# Patient Record
Sex: Female | Born: 1970 | Race: White | Hispanic: No | Smoking: Current every day smoker
Health system: Southern US, Community
[De-identification: ages and names within clinical notes are randomized; demographics above are authoritative.]

## PROBLEM LIST (undated history)

## (undated) DIAGNOSIS — F329 Major depressive disorder, single episode, unspecified: Secondary | ICD-10-CM

## (undated) DIAGNOSIS — I499 Cardiac arrhythmia, unspecified: Secondary | ICD-10-CM

## (undated) DIAGNOSIS — F419 Anxiety disorder, unspecified: Secondary | ICD-10-CM

## (undated) DIAGNOSIS — S93409A Sprain of unspecified ligament of unspecified ankle, initial encounter: Secondary | ICD-10-CM

## (undated) DIAGNOSIS — J449 Chronic obstructive pulmonary disease, unspecified: Secondary | ICD-10-CM

## (undated) DIAGNOSIS — F32A Depression, unspecified: Secondary | ICD-10-CM

## (undated) DIAGNOSIS — C3490 Malignant neoplasm of unspecified part of unspecified bronchus or lung: Secondary | ICD-10-CM

## (undated) DIAGNOSIS — Z923 Personal history of irradiation: Secondary | ICD-10-CM

## (undated) DIAGNOSIS — N289 Disorder of kidney and ureter, unspecified: Secondary | ICD-10-CM

## (undated) HISTORY — PX: APPENDECTOMY: SHX54

## (undated) HISTORY — PX: TUBAL LIGATION: SHX77

---

## 1996-11-25 HISTORY — PX: TUBAL LIGATION: SHX77

## 1998-08-20 ENCOUNTER — Emergency Department (HOSPITAL_COMMUNITY): Admission: EM | Admit: 1998-08-20 | Discharge: 1998-08-20 | Payer: Self-pay | Admitting: Emergency Medicine

## 1998-09-12 ENCOUNTER — Emergency Department (HOSPITAL_COMMUNITY): Admission: EM | Admit: 1998-09-12 | Discharge: 1998-09-12 | Payer: Self-pay | Admitting: Emergency Medicine

## 1998-12-15 ENCOUNTER — Emergency Department (HOSPITAL_COMMUNITY): Admission: EM | Admit: 1998-12-15 | Discharge: 1998-12-15 | Payer: Self-pay | Admitting: Emergency Medicine

## 1998-12-30 ENCOUNTER — Emergency Department (HOSPITAL_COMMUNITY): Admission: EM | Admit: 1998-12-30 | Discharge: 1998-12-30 | Payer: Self-pay | Admitting: Emergency Medicine

## 1999-01-29 ENCOUNTER — Emergency Department (HOSPITAL_COMMUNITY): Admission: EM | Admit: 1999-01-29 | Discharge: 1999-01-29 | Payer: Self-pay | Admitting: Emergency Medicine

## 1999-02-27 ENCOUNTER — Emergency Department (HOSPITAL_COMMUNITY): Admission: EM | Admit: 1999-02-27 | Discharge: 1999-02-27 | Payer: Self-pay | Admitting: Emergency Medicine

## 1999-03-22 ENCOUNTER — Emergency Department (HOSPITAL_COMMUNITY): Admission: EM | Admit: 1999-03-22 | Discharge: 1999-03-22 | Payer: Self-pay | Admitting: Emergency Medicine

## 1999-04-30 ENCOUNTER — Encounter: Payer: Self-pay | Admitting: Obstetrics

## 1999-04-30 ENCOUNTER — Encounter: Payer: Self-pay | Admitting: *Deleted

## 1999-04-30 ENCOUNTER — Inpatient Hospital Stay (HOSPITAL_COMMUNITY): Admission: AD | Admit: 1999-04-30 | Discharge: 1999-04-30 | Payer: Self-pay | Admitting: Obstetrics

## 1999-06-06 ENCOUNTER — Inpatient Hospital Stay (HOSPITAL_COMMUNITY): Admission: AD | Admit: 1999-06-06 | Discharge: 1999-06-07 | Payer: Self-pay | Admitting: *Deleted

## 1999-09-19 ENCOUNTER — Emergency Department (HOSPITAL_COMMUNITY): Admission: EM | Admit: 1999-09-19 | Discharge: 1999-09-19 | Payer: Self-pay | Admitting: Emergency Medicine

## 1999-09-21 ENCOUNTER — Inpatient Hospital Stay (HOSPITAL_COMMUNITY): Admission: AD | Admit: 1999-09-21 | Discharge: 1999-09-21 | Payer: Self-pay | Admitting: *Deleted

## 1999-11-01 ENCOUNTER — Emergency Department (HOSPITAL_COMMUNITY): Admission: EM | Admit: 1999-11-01 | Discharge: 1999-11-01 | Payer: Self-pay | Admitting: *Deleted

## 2000-02-14 ENCOUNTER — Emergency Department (HOSPITAL_COMMUNITY): Admission: EM | Admit: 2000-02-14 | Discharge: 2000-02-14 | Payer: Self-pay | Admitting: Emergency Medicine

## 2000-04-21 ENCOUNTER — Emergency Department (HOSPITAL_COMMUNITY): Admission: EM | Admit: 2000-04-21 | Discharge: 2000-04-21 | Payer: Self-pay | Admitting: *Deleted

## 2000-04-22 ENCOUNTER — Emergency Department (HOSPITAL_COMMUNITY): Admission: EM | Admit: 2000-04-22 | Discharge: 2000-04-22 | Payer: Self-pay | Admitting: Internal Medicine

## 2000-05-04 ENCOUNTER — Emergency Department (HOSPITAL_COMMUNITY): Admission: EM | Admit: 2000-05-04 | Discharge: 2000-05-04 | Payer: Self-pay | Admitting: *Deleted

## 2001-01-19 ENCOUNTER — Emergency Department (HOSPITAL_COMMUNITY): Admission: EM | Admit: 2001-01-19 | Discharge: 2001-01-19 | Payer: Self-pay | Admitting: Emergency Medicine

## 2001-02-09 ENCOUNTER — Inpatient Hospital Stay (HOSPITAL_COMMUNITY): Admission: RE | Admit: 2001-02-09 | Discharge: 2001-02-12 | Payer: Self-pay | Admitting: *Deleted

## 2001-04-12 ENCOUNTER — Inpatient Hospital Stay (HOSPITAL_COMMUNITY): Admission: AD | Admit: 2001-04-12 | Discharge: 2001-04-12 | Payer: Self-pay | Admitting: *Deleted

## 2001-04-14 ENCOUNTER — Encounter: Payer: Self-pay | Admitting: *Deleted

## 2001-04-14 ENCOUNTER — Ambulatory Visit (HOSPITAL_COMMUNITY): Admission: RE | Admit: 2001-04-14 | Discharge: 2001-04-14 | Payer: Self-pay | Admitting: *Deleted

## 2001-04-17 ENCOUNTER — Encounter (HOSPITAL_COMMUNITY): Admission: AD | Admit: 2001-04-17 | Discharge: 2001-05-04 | Payer: Self-pay | Admitting: *Deleted

## 2001-04-27 ENCOUNTER — Inpatient Hospital Stay (HOSPITAL_COMMUNITY): Admission: AD | Admit: 2001-04-27 | Discharge: 2001-04-27 | Payer: Self-pay | Admitting: Obstetrics & Gynecology

## 2001-05-01 ENCOUNTER — Inpatient Hospital Stay (HOSPITAL_COMMUNITY): Admission: AD | Admit: 2001-05-01 | Discharge: 2001-05-03 | Payer: Self-pay | Admitting: *Deleted

## 2001-05-01 ENCOUNTER — Encounter (INDEPENDENT_AMBULATORY_CARE_PROVIDER_SITE_OTHER): Payer: Self-pay | Admitting: Specialist

## 2001-06-09 ENCOUNTER — Encounter: Admission: RE | Admit: 2001-06-09 | Discharge: 2001-06-09 | Payer: Self-pay | Admitting: Obstetrics & Gynecology

## 2001-06-09 ENCOUNTER — Other Ambulatory Visit: Admission: RE | Admit: 2001-06-09 | Discharge: 2001-06-09 | Payer: Self-pay | Admitting: Obstetrics & Gynecology

## 2001-06-17 ENCOUNTER — Ambulatory Visit (HOSPITAL_COMMUNITY): Admission: RE | Admit: 2001-06-17 | Discharge: 2001-06-17 | Payer: Self-pay

## 2001-06-18 ENCOUNTER — Encounter: Payer: Self-pay | Admitting: Emergency Medicine

## 2001-06-18 ENCOUNTER — Emergency Department (HOSPITAL_COMMUNITY): Admission: EM | Admit: 2001-06-18 | Discharge: 2001-06-18 | Payer: Self-pay | Admitting: Emergency Medicine

## 2001-07-06 ENCOUNTER — Ambulatory Visit (HOSPITAL_COMMUNITY): Admission: RE | Admit: 2001-07-06 | Discharge: 2001-07-06 | Payer: Self-pay | Admitting: Obstetrics & Gynecology

## 2001-07-22 ENCOUNTER — Emergency Department (HOSPITAL_COMMUNITY): Admission: EM | Admit: 2001-07-22 | Discharge: 2001-07-22 | Payer: Self-pay | Admitting: Emergency Medicine

## 2001-07-22 ENCOUNTER — Encounter: Payer: Self-pay | Admitting: Emergency Medicine

## 2001-09-04 ENCOUNTER — Encounter: Admission: RE | Admit: 2001-09-04 | Discharge: 2001-09-04 | Payer: Self-pay | Admitting: Obstetrics & Gynecology

## 2002-05-16 ENCOUNTER — Emergency Department (HOSPITAL_COMMUNITY): Admission: EM | Admit: 2002-05-16 | Discharge: 2002-05-16 | Payer: Self-pay | Admitting: Emergency Medicine

## 2002-06-10 ENCOUNTER — Emergency Department (HOSPITAL_COMMUNITY): Admission: EM | Admit: 2002-06-10 | Discharge: 2002-06-10 | Payer: Self-pay | Admitting: Emergency Medicine

## 2002-06-10 ENCOUNTER — Encounter: Payer: Self-pay | Admitting: Emergency Medicine

## 2003-04-05 ENCOUNTER — Emergency Department (HOSPITAL_COMMUNITY): Admission: EM | Admit: 2003-04-05 | Discharge: 2003-04-05 | Payer: Self-pay | Admitting: Emergency Medicine

## 2003-04-28 ENCOUNTER — Emergency Department (HOSPITAL_COMMUNITY): Admission: EM | Admit: 2003-04-28 | Discharge: 2003-04-28 | Payer: Self-pay | Admitting: *Deleted

## 2003-04-28 ENCOUNTER — Encounter: Payer: Self-pay | Admitting: *Deleted

## 2003-06-17 ENCOUNTER — Emergency Department (HOSPITAL_COMMUNITY): Admission: EM | Admit: 2003-06-17 | Discharge: 2003-06-17 | Payer: Self-pay | Admitting: Emergency Medicine

## 2003-08-26 ENCOUNTER — Encounter: Payer: Self-pay | Admitting: Emergency Medicine

## 2003-08-26 ENCOUNTER — Emergency Department (HOSPITAL_COMMUNITY): Admission: EM | Admit: 2003-08-26 | Discharge: 2003-08-26 | Payer: Self-pay | Admitting: Emergency Medicine

## 2003-11-30 ENCOUNTER — Emergency Department (HOSPITAL_COMMUNITY): Admission: EM | Admit: 2003-11-30 | Discharge: 2003-11-30 | Payer: Self-pay | Admitting: Emergency Medicine

## 2004-01-30 ENCOUNTER — Emergency Department (HOSPITAL_COMMUNITY): Admission: EM | Admit: 2004-01-30 | Discharge: 2004-01-31 | Payer: Self-pay | Admitting: Emergency Medicine

## 2004-06-11 ENCOUNTER — Emergency Department (HOSPITAL_COMMUNITY): Admission: EM | Admit: 2004-06-11 | Discharge: 2004-06-11 | Payer: Self-pay | Admitting: Emergency Medicine

## 2005-05-05 ENCOUNTER — Emergency Department (HOSPITAL_COMMUNITY): Admission: EM | Admit: 2005-05-05 | Discharge: 2005-05-05 | Payer: Self-pay | Admitting: *Deleted

## 2006-09-29 ENCOUNTER — Emergency Department (HOSPITAL_COMMUNITY): Admission: EM | Admit: 2006-09-29 | Discharge: 2006-09-29 | Payer: Self-pay | Admitting: Emergency Medicine

## 2006-10-08 ENCOUNTER — Emergency Department (HOSPITAL_COMMUNITY): Admission: EM | Admit: 2006-10-08 | Discharge: 2006-10-08 | Payer: Self-pay | Admitting: Emergency Medicine

## 2007-05-29 ENCOUNTER — Emergency Department (HOSPITAL_COMMUNITY): Admission: EM | Admit: 2007-05-29 | Discharge: 2007-05-29 | Payer: Self-pay | Admitting: *Deleted

## 2007-06-06 ENCOUNTER — Emergency Department (HOSPITAL_COMMUNITY): Admission: EM | Admit: 2007-06-06 | Discharge: 2007-06-06 | Payer: Self-pay | Admitting: *Deleted

## 2007-09-02 ENCOUNTER — Emergency Department (HOSPITAL_COMMUNITY): Admission: EM | Admit: 2007-09-02 | Discharge: 2007-09-02 | Payer: Self-pay | Admitting: Emergency Medicine

## 2008-01-14 ENCOUNTER — Emergency Department (HOSPITAL_COMMUNITY): Admission: EM | Admit: 2008-01-14 | Discharge: 2008-01-14 | Payer: Self-pay | Admitting: Emergency Medicine

## 2008-02-12 ENCOUNTER — Emergency Department (HOSPITAL_COMMUNITY): Admission: EM | Admit: 2008-02-12 | Discharge: 2008-02-12 | Payer: Self-pay | Admitting: Emergency Medicine

## 2008-03-17 ENCOUNTER — Emergency Department (HOSPITAL_COMMUNITY): Admission: EM | Admit: 2008-03-17 | Discharge: 2008-03-17 | Payer: Self-pay | Admitting: Emergency Medicine

## 2008-12-16 ENCOUNTER — Emergency Department (HOSPITAL_COMMUNITY): Admission: EM | Admit: 2008-12-16 | Discharge: 2008-12-16 | Payer: Self-pay | Admitting: Emergency Medicine

## 2009-12-31 ENCOUNTER — Emergency Department (HOSPITAL_COMMUNITY): Admission: EM | Admit: 2009-12-31 | Discharge: 2009-12-31 | Payer: Self-pay | Admitting: Emergency Medicine

## 2011-04-12 NOTE — H&P (Signed)
[facility]  Patient:    [name], [name]                       MRN: [id number] Adm. Date:  [id number] Disc. Date: [id number] Attending:  Michaelle Copas CC:         GYN Outpatient Clinic  OB-GYN Teaching Service   History and Physical  CHIEF COMPLAINT:              The patient is a 40 year old, para 4, who desires a sterilization procedure.  HISTORY OF PRESENT ILLNESS:   At present, the patient is on Depo-Provera.  The risks, benefits, and alternative forms of management were reviewed with the patient, including, but not limited a risk of failure of 4-06/999 cases with a subsequent increased risk of an ectopic gestation.  ALLERGIES:                    No known drug allergies.  MEDICATIONS:                  Tylenol.  PAST OBSTETRICAL HISTORY:     She has a history of gonorrhea.  No hospitalizations for PID.  She is status post four spontaneous vaginal deliveries.  She is HIV nonreactive by report.  She has a history of ovarian cyst.  PAST SURGICAL HISTORY:        She is status post an appendectomy.  SOCIAL HISTORY:               Remarkable for tobacco use.  PHYSICAL EXAMINATION:         The pulse is 77 and blood pressure 113/77.  WEIGHT:                       148 pounds.  HEIGHT:                       5 feet 8 inches.  GENERAL APPEARANCE:           Well developed, well nourished, and no apparent distress.  HEENT:                        Normocephalic and atraumatic.  NECK:                         Supple.  LUNGS:                        Clear to auscultation bilaterally.  HEART:                        Regular rate and rhythm.  ABDOMEN:                      Soft.  There is minimal right lower quadrant tenderness to deep palpation.  PELVIC:                       The external female genitalia are normal appearing.  On speculum exam, there is scant white discharge.  Pap smear, GC and chlamydia probes, and wet prep were sent.  By  bimanual exam, the uterus is small, anteverted, and nontender.  There is minimal right adnexal tenderness. No masses palpated.  The left adnexa is nonpalpable and nontender.  EXTREMITIES:  No clubbing, cyanosis, or edema.  SKIN:                         Without rash.  ASSESSMENT:                   Multiparity, desires sterilization procedure.  PLAN:                         Laparoscopic bilateral tubal ligation.  Will check the Pap smear, GC and chlamydia probes, and wet prep performed today. DD:  06/09/01 TD:  06/09/01 Job: [id number]

## 2011-04-12 NOTE — Op Note (Signed)
[facility]  Patient:    [name], [name]                       MRN: [id number] Proc. Date: 07/06/01 Adm. Date:  [id number] Attending:  Antionette Char CC:         GYN OUtpatient Clinic   Operative Report  PREOPERATIVE DIAGNOSIS:       Multiparity, desires permanent sterilization.  POSTOPERATIVE DIAGNOSIS:      Multiparity, desires permanent sterilization.  OPERATION:                    Laparoscopic tubal ligation with bipolar cautery.  SURGEON:                      Roseanna Rainbow, M.D.  ASSISTANT:  ANESTHESIA:                   General endotracheal anesthesia.  COMPLICATIONS:                None.  ESTIMATED BLOOD LOSS:         75 cc.  FLUIDS:                       As per anesthesiology.  URINE OUTPUT:                 200 cc clear urine at the beginning of the procedure.  FINDINGS:                     Normal uterus and ovaries.  There were filmy peritubal adhesions involving the ampullary portion of the tube on the patients right.  There were also filmy adhesions involving the cecum and there were adhesions involving the greater omentum to the anterior abdominal wall in right lower abdomen.  DESCRIPTION OF PROCEDURE:     The patient was taken to the operating room where general anesthesia was obtained without difficulty.  The patient was examined under anesthesia and found to have a small anteverted uterus with normal adnexa.  She was then placed in the dorsal lithotomy position and prepped and draped in the usual sterile fashion.  A bivalve speculum was then placed into the patients vagina and the Hulka probe and tenaculum were then advanced into the uterus and secured to the anterior lip of the cervix as a means to manipulate the uterus.  The speculum was removed from the vagina. Attention was then turned to the patients abdomen where a 10 mm skin incision was made in the umbilical fold.  The Veress needle was carefully  introduced into the peritoneal cavity at a 45 degree angle while tenting the abdominal wall.  Intraperitoneal placement was confirmed by water-filled syringe and drop in the intra-abdominal pressure with insufflation of CO2 gas.  A pneumoperitoneum was obtained with 4 liters of CO2 gas.  The trocar and sleeve were then advanced without difficulty into the abdomen where intra-abdominal placement was confirmed by the laparoscope.  Survey of the patients pelvis and abdomen revealed the above findings.  The bipolar cautery apparatus was then advanced through the operative port of the laparoscope and the patients left fallopian tube was identified and followed out to the fimbriated end.  A 2 to 3 cm segment of tube approximately 4 cm from the uterine cornu was then cauterized contiguously.  With each application, the ohmeter was noted to go to 0.  The  right fallopian tube was manipulated in a similar fashion.  The instruments were removed from the patients abdomen and the incision repaired with 3-0 Vicryl.  The Hulka tenaculum and probe were then removed from the vagina with minimal bleeding noted from the cervix.  The patient tolerated the procedure well.  Sponge, needle, and instrument counts were correct x 2. The patient was taken to the PACU in stable condition. DD:  07/06/01 TD:  07/06/01 Job: [id number]
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
## 2011-04-12 NOTE — H&P (Signed)
[facility]  Patient:    [name], [name]                       MRN: [id number] Adm. Date:  [id number] Attending:  Denny Peon Dictator:   Young Berry Lorin Picket, RNNP                         History and Physical  ADDENDUM:  REVIEW OF SYSTEMS:  CONSTITUTIONAL:  The patient reports she had an appendectomy aspirin  child. She currently has three children, all by normal vaginal deliveries. She reports problems with iron deficiency anemia with each pregnancy along with some back pain and lower abdominal pain after walking after each pregnancy. The patient also reports problems with frequent and persistent headache as previously noted in the history, although this morning she notes that her headache has resolved and she is without it and she slept very well last night. CARDIAC:  The patient reports no history of syncope, swelling, heart murmur, peripheral edema, no problems with palpitations or angina. PULMONARY:  The patient reports a history of asthma but has not taken medication for this in more than a year nor has she had an episode of shortness of breath in more than a year.  NEUROLOGICAL:  The patient reports no history of seizures, brain tumor or stroke. She does complain of headache as previously noted. HEMATOLOGY:  The patient reports problems with "low iron" with each pregnancy. She has been on prenatal vitamins but has declined to take this more days than not due to the indigestion that it has caused her and she has been taking this in the morning.  ENDOCRINE:  The patient reports no history of diabetes, denies any problems with thyroid, no problems with intolerance to heat or cold.  GI:  The patient reports indigestion periodically. It seems to be getting worse as her pregnancy progresses, this most often after meals, especially after eating spicy foods. She denies any problems with constipation. GENITOURINARY:  The patient reports that she  had urinary tract infection in the past, last one over a year ago. MUSCULOSKELETAL:  The patient denies any problems. SKIN: The patient reports that she has one tattoo over her right breast, a small one. NUTRITION:  The patient reports a weight loss of approximately 10 pounds in the past month she believes, due to her depression.  PHYSICAL EXAMINATION:  CONSTITUTIONAL:  This is a well-developed, well-nourished white female who is in in no acute distress. She is relaxed and cooperative and is generally healthy in appearance.  VITAL SIGNS:  At 8:00 a.m. temperature 96.9, pulse 97, respirations 20 and blood pressure 130/71 sitting and 133/70 standing. Weight today 159 pounds and she reports her height at 5 feet 8 inches.  The patient appears to be her stated age of 40 years old.  SKIN:  Medium in tone and is smooth. The patient has some striae noted on her lower abdomen on the right and has an old appendectomy scar noted. Also a small tattoo is noted above the right breast. Skin is otherwise unremarkable.  NAILS:  In satisfactory condition.  HAIR:  The patient has thick, lustrous hair, long and in good condition and this is of a light brown. Hair distribution is normal for age and sex.  HEAD:  Normocephalic without lesions. No tenderness noted over frontal or maxillary sinuses. Head is held midline.  ENT:  Pupils equal, round and reactive to  light and accommodation. Extraocular movements intact. No vessel changes seen. Disks are not visualized bilaterally. Ears:  External ear canals are patent. Tympanic membranes normal light reflexes with landmarks noted bilaterally. Nasal mucosa is pink and moist with a small amount of clear rhinorrhea. Septum is intact. Oral mucosa is pink and moist. Teeth are in satisfactory condition. No remarkable breath odors. Tongue is midline without fasciculation, uvula is midline.  NECK:  Supple without thyromegaly.  CARDIOVASCULAR:  S1 and S2 heard,  regular rate and rhythm without murmurs, clicks or gallops. Carotids are 2+ without bruit, no abdominal bruit is heard, no JVD.  PERIPHERAL PULSES:  Two plus.  EXTREMITIES:  Pink and warm without edema. No clubbing noted.  CHEST:  Symmetrical in shape.  BREASTS:  Exam deferred.  LUNGS:  Clear to auscultation, no wheezes noted. There is no CVA tenderness noted.  ABDOMEN:  Rounded. Did not attempt to gauge fundal height. Fetal movements are palpable.  Did not attempt to auscultate fetal heart tones. Bowel sounds are present. No tenderness to palpation, no evidence of hepatomegaly. No suprapubic tenderness noted.  MUSCULOSKELETAL:  Patient is of upright posture with normal gait. Spine is straight. No redness or swelling of any joints. No restrictions in range of motion evident.  NEUROLOGICAL:  Extraocular movements intact without nystagmus. Cranial nerves II-XII intact. Motor movements are smooth, no tremor. Sensory is intact. Cerebellar function is intact for rapid alternating movements and heel-to-shin movements. Reflexes are 3+/5 and brisk. Romberg is without finding. Babinskis are upward. DD:  02/11/01 TD:  02/11/01 Job: [id number]

## 2011-07-17 ENCOUNTER — Emergency Department (HOSPITAL_COMMUNITY): Payer: Self-pay

## 2011-07-17 ENCOUNTER — Emergency Department (HOSPITAL_COMMUNITY)
Admission: EM | Admit: 2011-07-17 | Discharge: 2011-07-17 | Disposition: A | Discharge status: Home / Self Care | Payer: Self-pay | Attending: Emergency Medicine | Admitting: Emergency Medicine

## 2011-07-17 DIAGNOSIS — R209 Unspecified disturbances of skin sensation: Secondary | ICD-10-CM | POA: Insufficient documentation

## 2011-07-17 DIAGNOSIS — F17200 Nicotine dependence, unspecified, uncomplicated: Secondary | ICD-10-CM | POA: Insufficient documentation

## 2011-07-17 DIAGNOSIS — R059 Cough, unspecified: Secondary | ICD-10-CM | POA: Insufficient documentation

## 2011-07-17 DIAGNOSIS — R0602 Shortness of breath: Secondary | ICD-10-CM | POA: Insufficient documentation

## 2011-07-17 DIAGNOSIS — J45909 Unspecified asthma, uncomplicated: Secondary | ICD-10-CM | POA: Insufficient documentation

## 2011-07-17 DIAGNOSIS — J40 Bronchitis, not specified as acute or chronic: Secondary | ICD-10-CM | POA: Insufficient documentation

## 2011-07-17 DIAGNOSIS — R05 Cough: Secondary | ICD-10-CM | POA: Insufficient documentation

## 2011-08-19 LAB — CBC
HCT: 44
Hemoglobin: 15.3 — ABNORMAL HIGH
RDW: 12.5
WBC: 6.7

## 2011-08-19 LAB — I-STAT 8, (EC8 V) (CONVERTED LAB)
BUN: 9
Glucose, Bld: 99
Hemoglobin: 16.7 — ABNORMAL HIGH
Potassium: 3.6
Sodium: 136

## 2011-08-19 LAB — URINALYSIS, ROUTINE W REFLEX MICROSCOPIC
Glucose, UA: NEGATIVE
Ketones, ur: NEGATIVE
Leukocytes, UA: NEGATIVE
pH: 6

## 2011-08-19 LAB — DIFFERENTIAL
Eosinophils Relative: 2
Lymphocytes Relative: 36
Lymphs Abs: 2.4
Monocytes Absolute: 0.4

## 2011-08-19 LAB — URINE CULTURE

## 2011-08-19 LAB — URINE MICROSCOPIC-ADD ON

## 2012-01-24 ENCOUNTER — Emergency Department (HOSPITAL_COMMUNITY)
Admission: EM | Admit: 2012-01-24 | Discharge: 2012-01-24 | Disposition: A | Discharge status: Home / Self Care | Payer: Medicaid Other | Attending: Emergency Medicine | Admitting: Emergency Medicine

## 2012-01-24 ENCOUNTER — Emergency Department (HOSPITAL_COMMUNITY): Payer: Medicaid Other

## 2012-01-24 ENCOUNTER — Encounter (HOSPITAL_COMMUNITY): Payer: Self-pay | Admitting: Emergency Medicine

## 2012-01-24 DIAGNOSIS — R05 Cough: Secondary | ICD-10-CM | POA: Insufficient documentation

## 2012-01-24 DIAGNOSIS — R059 Cough, unspecified: Secondary | ICD-10-CM | POA: Insufficient documentation

## 2012-01-24 DIAGNOSIS — R1110 Vomiting, unspecified: Secondary | ICD-10-CM | POA: Insufficient documentation

## 2012-01-24 DIAGNOSIS — J069 Acute upper respiratory infection, unspecified: Secondary | ICD-10-CM

## 2012-01-24 DIAGNOSIS — M549 Dorsalgia, unspecified: Secondary | ICD-10-CM | POA: Insufficient documentation

## 2012-01-24 DIAGNOSIS — J45909 Unspecified asthma, uncomplicated: Secondary | ICD-10-CM | POA: Insufficient documentation

## 2012-01-24 HISTORY — DX: Disorder of kidney and ureter, unspecified: N28.9

## 2012-01-24 MED ORDER — TRAMADOL-ACETAMINOPHEN 37.5-325 MG PO TABS
1.0000 | ORAL_TABLET | Freq: Once | ORAL | Status: AC
  Administered 2012-01-24: 1 via ORAL
  Filled 2012-01-24: qty 1

## 2012-01-24 MED ORDER — BENZONATATE 100 MG PO CAPS: 100.0000 mg | ORAL_CAPSULE | Freq: Three times a day (TID) | ORAL | Status: AC

## 2012-01-24 MED ORDER — IPRATROPIUM BROMIDE 0.02 % IN SOLN
0.5000 mg | Freq: Once | RESPIRATORY_TRACT | Status: AC
  Administered 2012-01-24: 0.5 mg via RESPIRATORY_TRACT
  Filled 2012-01-24 (×2): qty 2.5

## 2012-01-24 MED ORDER — ALBUTEROL SULFATE (5 MG/ML) 0.5% IN NEBU
5.0000 mg | INHALATION_SOLUTION | Freq: Once | RESPIRATORY_TRACT | Status: AC
  Administered 2012-01-24: 5 mg via RESPIRATORY_TRACT
  Filled 2012-01-24: qty 0.5

## 2012-01-24 MED ORDER — ALBUTEROL SULFATE HFA 108 (90 BASE) MCG/ACT IN AERS
2.0000 | INHALATION_SPRAY | RESPIRATORY_TRACT | Status: DC | PRN
  Administered 2012-01-24: 2 via RESPIRATORY_TRACT
  Filled 2012-01-24: qty 6.7

## 2012-01-24 MED ORDER — ALBUTEROL SULFATE HFA 108 (90 BASE) MCG/ACT IN AERS: 2.0000 | INHALATION_SPRAY | RESPIRATORY_TRACT | Status: DC | PRN

## 2012-01-24 NOTE — ED Notes (Signed)
Pt reports headache above eyes, cough causing vomiting , upper back pain x 2 days

## 2012-01-24 NOTE — Progress Notes (Signed)
Rt gave pt albuterl 5.0mg /atrovent 0.5mg  neb and albuterol inhaler with spacer. Pt does know how to use.

## 2012-01-24 NOTE — Discharge Instructions (Signed)
Cool Mist Vaporizers Vaporizers may help relieve the symptoms of a cough and cold. By adding water to the air, mucus may become thinner and less sticky. This makes it easier to breathe and cough up secretions. Vaporizers have not been proven to show they help with colds. You should not use a vaporizer if you are allergic to mold. Cool mist vaporizers do not cause serious burns like hot mist vaporizers ("steamers"). HOME CARE INSTRUCTIONS  Follow the package instructions for your vaporizer.   Use a vaporizer that holds a large volume of water (1 to 2 gallons [5.7 to 7.5 liters]).   Do not use anything other than distilled water in the vaporizer.   Do not run the vaporizer all of the time. This can cause mold or bacteria to grow in the vaporizer.   Clean the vaporizer after each time you use it.   Clean and dry the vaporizer well before you store it.   Stop using a vaporizer if you develop worsening respiratory symptoms.  Document Released: 08/08/2004 Document Revised: 07/24/2011 Document Reviewed: 07/06/2009 [facility] Patient Information 2012 [place], [place].Upper Respiratory Infection, Adult An upper respiratory infection (URI) is also sometimes known as the common cold. The upper respiratory tract includes the nose, sinuses, throat, trachea, and bronchi. Bronchi are the airways leading to the lungs. Most people improve within 1 week, but symptoms can last up to 2 weeks. A residual cough may last even longer.  CAUSES Many different viruses can infect the tissues lining the upper respiratory tract. The tissues become irritated and inflamed and often become very moist. Mucus production is also common. A cold is contagious. You can easily spread the virus to others by oral contact. This includes kissing, sharing a glass, coughing, or sneezing. Touching your mouth or nose and then touching a surface, which is then touched by another person, can also spread the virus. SYMPTOMS  Symptoms typically  develop 1 to 3 days after you come in contact with a cold virus. Symptoms vary from person to person. They may include:  Runny nose.   Sneezing.   Nasal congestion.   Sinus irritation.   Sore throat.   Loss of voice (laryngitis).   Cough.   Fatigue.   Muscle aches.   Loss of appetite.   Headache.   Low-grade fever.  DIAGNOSIS  You might diagnose your own cold based on familiar symptoms, since most people get a cold 2 to 3 times a year. Your caregiver can confirm this based on your exam. Most importantly, your caregiver can check that your symptoms are not due to another disease such as strep throat, sinusitis, pneumonia, asthma, or epiglottitis. Blood tests, throat tests, and X-rays are not necessary to diagnose a common cold, but they may sometimes be helpful in excluding other more serious diseases. Your caregiver will decide if any further tests are required. RISKS AND COMPLICATIONS  You may be at risk for a more severe case of the common cold if you smoke cigarettes, have chronic heart disease (such as heart failure) or lung disease (such as asthma), or if you have a weakened immune system. The very young and very old are also at risk for more serious infections. Bacterial sinusitis, middle ear infections, and bacterial pneumonia can complicate the common cold. The common cold can worsen asthma and chronic obstructive pulmonary disease (COPD). Sometimes, these complications can require emergency medical care and may be life-threatening. PREVENTION  The best way to protect against getting a cold is to practice  good hygiene. Avoid oral or hand contact with people with cold symptoms. Wash your hands often if contact occurs. There is no clear evidence that vitamin C, vitamin E, echinacea, or exercise reduces the chance of developing a cold. However, it is always recommended to get plenty of rest and practice good nutrition. TREATMENT  Treatment is directed at relieving symptoms. There  is no cure. Antibiotics are not effective, because the infection is caused by a virus, not by bacteria. Treatment may include:  Increased fluid intake. Sports drinks offer valuable electrolytes, sugars, and fluids.   Breathing heated mist or steam (vaporizer or shower).   Eating chicken soup or other clear broths, and maintaining good nutrition.   Getting plenty of rest.   Using gargles or lozenges for comfort.   Controlling fevers with ibuprofen or acetaminophen as directed by your caregiver.   Increasing usage of your inhaler if you have asthma.  Zinc gel and zinc lozenges, taken in the first 24 hours of the common cold, can shorten the duration and lessen the severity of symptoms. Pain medicines may help with fever, muscle aches, and throat pain. A variety of non-prescription medicines are available to treat congestion and runny nose. Your caregiver can make recommendations and may suggest nasal or lung inhalers for other symptoms.  HOME CARE INSTRUCTIONS   Only take over-the-counter or prescription medicines for pain, discomfort, or fever as directed by your caregiver.   Use a warm mist humidifier or inhale steam from a shower to increase air moisture. This may keep secretions moist and make it easier to breathe.   Drink enough water and fluids to keep your urine clear or pale yellow.   Rest as needed.   Return to work when your temperature has returned to normal or as your caregiver advises. You may need to stay home longer to avoid infecting others. You can also use a face mask and careful hand washing to prevent spread of the virus.  SEEK MEDICAL CARE IF:   After the first few days, you feel you are getting worse rather than better.   You need your caregiver's advice about medicines to control symptoms.   You develop chills, worsening shortness of breath, or brown or red sputum. These may be signs of pneumonia.   You develop yellow or brown nasal discharge or pain in the  face, especially when you bend forward. These may be signs of sinusitis.   You develop a fever, swollen neck glands, pain with swallowing, or white areas in the back of your throat. These may be signs of strep throat.  SEEK IMMEDIATE MEDICAL CARE IF:   You have a fever.   You develop severe or persistent headache, ear pain, sinus pain, or chest pain.   You develop wheezing, a prolonged cough, cough up blood, or have a change in your usual mucus (if you have chronic lung disease).   You develop sore muscles or a stiff neck.  Document Released: 05/07/2001 Document Revised: 07/24/2011 Document Reviewed: 03/15/2011 [facility] Patient Information 2012 [place], [place].

## 2012-01-24 NOTE — ED Provider Notes (Signed)
History     CSN: [id number]  Arrival date & time 01/24/12  0815   First MD Initiated Contact with Patient 01/24/12 [phone]      Chief Complaint  Patient presents with  . Back Pain    3 day hx of cough causing back pain  . Cough     x 2 days- causing vomiting    (Consider location/radiation/quality/duration/timing/severity/associated sxs/prior treatment) HPI  Pt presents to the ED with complaints of asthma and cough for 1 week. She has a history of asthma and is a heavy smoker. She states that when she coughs, she vomits afterwards. Now, when she coughs, her back hurts as well. She denies having fevers, weakness, chills, diarrhea, sore throat, ear pain. She has tried OTC cough medications but it has not helped.  Past Medical History  Diagnosis Date  . Asthma   . Renal disorder     Past Surgical History  Procedure Date  . Appendectomy     Family History  Problem Relation Age of Onset  . Hypertension Mother   . Hypertension Father     History  Substance Use Topics  . Smoking status: Current Everyday Smoker  . Smokeless tobacco: Not on file  . Alcohol Use: No    OB History    Grav Para Term Preterm Abortions TAB SAB Ect Mult Living                  Review of Systems  All other systems reviewed and are negative.    Allergies  Review of patient's allergies indicates no known allergies.  Home Medications   Current Outpatient Rx  Name Route Sig Dispense Refill  . ALBUTEROL SULFATE HFA 108 (90 BASE) MCG/ACT IN AERS Inhalation Inhale 2 puffs into the lungs every 6 (six) hours as needed. For shortness of breath.    . TYLENOL PM EXTRA STRENGTH PO Oral Take 2 tablets by mouth at bedtime as needed. For sleep/pain.    . IBUPROFEN 200 MG PO TABS Oral Take 400 mg by mouth every 8 (eight) hours as needed. For pain.    Marland Kitchen SALINE NASAL SPRAY 0.65 % NA SOLN Nasal Place 1 spray into the nose daily as needed. For congestion/dryness.    . ALBUTEROL SULFATE HFA 108 (90 BASE)  MCG/ACT IN AERS Inhalation Inhale 2 puffs into the lungs every 4 (four) hours as needed for wheezing. 1 Inhaler 0  . BENZONATATE 100 MG PO CAPS Oral Take 1 capsule (100 mg total) by mouth every 8 (eight) hours. 21 capsule 0    BP 114/74  Pulse 130  Temp(Src) 98.4 F (36.9 C) (Oral)  Resp 26  SpO2 96%  LMP 01/01/2012  Physical Exam  Nursing note and vitals reviewed. Constitutional: She appears well-developed and well-nourished. No distress.  HENT:  Head: Normocephalic and atraumatic.  Right Ear: External ear normal.  Left Ear: External ear normal.  Nose: Nose normal.  Eyes: Pupils are equal, round, and reactive to light.  Neck: Normal range of motion. Neck supple.  Cardiovascular: Normal rate and regular rhythm.   Pulmonary/Chest: Effort normal. No respiratory distress. She has wheezes (pt coughing on exam.). She has no rales. She exhibits no tenderness.  Abdominal: Soft.  Neurological: She is alert.  Skin: Skin is warm and dry.    ED Course  Procedures (including critical care time)  Labs Reviewed - No data to display Dg Chest 2 View  01/24/2012  *RADIOLOGY REPORT*  Clinical Data: Cough and wheezing.  CHEST -  2 VIEW  Comparison: 07/17/2011 and 09/02/2007  Findings: Chronic diffuse pulmonary interstitial prominence is seen, consistent with chronic interstitial lung disease.  No evidence of acute or superimposed infiltrate.  No evidence of pleural effusion.  No mass or lymphadenopathy identified.  Heart size is normal.  IMPRESSION: Stable appearance of chronic interstitial lung disease.  No acute findings.  Original Report Authenticated By: Danae Orleans, M.D.     1. URI (upper respiratory infection)       MDM  Chest xray shows that the patient has chronic interstitial lung disease and no acute process is going on such as  Pneumonia or bronchitis. Pt given Nebulizer treatment in ED. Will give Rx for albuterol and cough medication. Pt has been given recommendations to quite  smoking and informed that her lungs have been damaged from all of the smoke.       Dorthula Matas, PA 01/24/12 [phone]

## 2012-01-26 NOTE — ED Provider Notes (Signed)
Medical screening examination/treatment/procedure(s) were performed by non-physician practitioner and as supervising physician I was immediately available for consultation/collaboration.   Hurman Horn, MD 01/26/12 2250

## 2012-04-02 ENCOUNTER — Emergency Department (HOSPITAL_COMMUNITY)
Admission: EM | Admit: 2012-04-02 | Discharge: 2012-04-02 | Disposition: A | Discharge status: Home / Self Care | Payer: Medicaid Other | Attending: Emergency Medicine | Admitting: Emergency Medicine

## 2012-04-02 ENCOUNTER — Encounter (HOSPITAL_COMMUNITY): Payer: Self-pay | Admitting: Emergency Medicine

## 2012-04-02 DIAGNOSIS — J45909 Unspecified asthma, uncomplicated: Secondary | ICD-10-CM | POA: Insufficient documentation

## 2012-04-02 DIAGNOSIS — L0291 Cutaneous abscess, unspecified: Secondary | ICD-10-CM

## 2012-04-02 DIAGNOSIS — L02818 Cutaneous abscess of other sites: Secondary | ICD-10-CM | POA: Insufficient documentation

## 2012-04-02 DIAGNOSIS — L03818 Cellulitis of other sites: Secondary | ICD-10-CM | POA: Insufficient documentation

## 2012-04-02 MED ORDER — OXYCODONE-ACETAMINOPHEN 5-325 MG PO TABS: 1.0000 | ORAL_TABLET | ORAL | Status: AC | PRN

## 2012-04-02 MED ORDER — SULFAMETHOXAZOLE-TRIMETHOPRIM 800-160 MG PO TABS: 1.0000 | ORAL_TABLET | Freq: Two times a day (BID) | ORAL | Status: DC

## 2012-04-02 MED ORDER — OXYCODONE-ACETAMINOPHEN 5-325 MG PO TABS
2.0000 | ORAL_TABLET | Freq: Once | ORAL | Status: AC
  Administered 2012-04-02: 2 via ORAL
  Filled 2012-04-02: qty 2

## 2012-04-02 MED ORDER — IBUPROFEN 200 MG PO TABS
400.0000 mg | ORAL_TABLET | Freq: Once | ORAL | Status: AC
  Administered 2012-04-02: 400 mg via ORAL
  Filled 2012-04-02: qty 1

## 2012-04-02 MED ORDER — LIDOCAINE-EPINEPHRINE 2 %-1:100000 IJ SOLN
20.0000 mL | Freq: Once | INTRAMUSCULAR | Status: AC
  Administered 2012-04-02: 20 mL

## 2012-04-02 MED ORDER — DIAZEPAM 5 MG PO TABS
5.0000 mg | ORAL_TABLET | Freq: Once | ORAL | Status: AC
  Administered 2012-04-02: 5 mg via ORAL
  Filled 2012-04-02: qty 1

## 2012-04-02 NOTE — Discharge Instructions (Signed)
Abscess An abscess (boil or furuncle) is an infected area that contains a collection of pus.  SYMPTOMS Signs and symptoms of an abscess include pain, tenderness, redness, or hardness. You may feel a moveable soft area under your skin. An abscess can occur anywhere in the body.  TREATMENT  A surgical cut (incision) may be made over your abscess to drain the pus. Gauze may be packed into the space or a drain may be looped through the abscess cavity (pocket). This provides a drain that will allow the cavity to heal from the inside outwards. The abscess may be painful for a few days, but should feel much better if it was drained.  Your abscess, if seen early, may not have localized and may not have been drained. If not, another appointment may be required if it does not get better on its own or with medications. HOME CARE INSTRUCTIONS   Only take over-the-counter or prescription medicines for pain, discomfort, or fever as directed by your caregiver.   Take your antibiotics as directed if they were prescribed. Finish them even if you start to feel better.   Keep the skin and clothes clean around your abscess.   If the abscess was drained, you will need to use gauze dressing to collect any draining pus. Dressings will typically need to be changed 3 or more times a day.   The infection may spread by skin contact with others. Avoid skin contact as much as possible.   Practice good hygiene. This includes regular hand washing, cover any draining skin lesions, and do not share personal care items.   If you participate in sports, do not share athletic equipment, towels, whirlpools, or personal care items. Shower after every practice or tournament.   If a draining area cannot be adequately covered:   Do not participate in sports.   Children should not participate in day care until the wound has healed or drainage stops.   If your caregiver has given you a follow-up appointment, it is very important  to keep that appointment. Not keeping the appointment could result in a much worse infection, chronic or permanent injury, pain, and disability. If there is any problem keeping the appointment, you must call back to this facility for assistance.  SEEK MEDICAL CARE IF:   You develop increased pain, swelling, redness, drainage, or bleeding in the wound site.   You develop signs of generalized infection including muscle aches, chills, fever, or a general ill feeling.   You have an oral temperature above 102 F (38.9 C).  MAKE SURE YOU:   Understand these instructions.   Will watch your condition.   Will get help right away if you are not doing well or get worse.  Document Released: 08/21/2005 Document Revised: 10/31/2011 Document Reviewed: 06/14/2008 [facility] Patient Information 2012 [place], [place].  RESOURCE GUIDE  Dental Problems  Patients with Medicaid: [facility] [phone] [address]                                           [phone] [address] Phone:  [phone]  Phone:  [phone]  If unable to pay or uninsured, contact:  [facility] or [facility]. to become qualified for the adult dental clinic.  Chronic Pain Problems Contact [facility]  [phone] Patients need to be referred by their primary care doctor.  Insufficient Money for Medicine Contact [employer]:  call "211" or [facility] [phone].  No Primary Care Doctor Call [facility]  [phone] Other agencies that provide inexpensive medical care    [facility]  [phone]    [facility]  [phone]    [facility]  [phone]    [facility]  [phone]    [facility]  [phone]    [facility]  [phone]  Psychological Services [facility]  [phone] [facility]  [phone] [facility] Mental [facility]    [phone] (emergency services [phone])  Substance Abuse Resources [facility]  [phone] [facility] [phone] [facility] [place] [phone] Floydene Flock [phone] Residential & Outpatient Substance Abuse Program  [phone]  Abuse/Neglect [facility] Child Abuse Hotline (770)568-8170 [facility] Child Abuse Hotline (575) 394-0335 (After Hours)  Emergency Shelter [facility] [phone]) [phone]  Maternity Homes [facility] at [facility] [place] [facility] [place] [phone] [facility] [phone]  MRSA Hotline #:   (409)163-3320    [facility] Resources  [facility] [place]     [employer]                          [facility] Dept. [address] [place]                       [address]      [address]  Blondell Reveal Phone:  [phone]                                   Phone:  [phone]                 Phone:  [phone]  [facility] Mental Health Phone:  [phone]  [facility] Child Abuse Hotline (551)602-7778 501-141-8250 (After Hours)

## 2012-04-02 NOTE — ED Provider Notes (Signed)
History    41yF with painful lesion back of head. Denies trauma. Hurts at rest and much worse with palpation. Progressively worsening. No drainage. No fever or chills. No n/v. Denies hx of diabetes.  CSN: [id number]  Arrival date & time 04/02/12  7829   First MD Initiated Contact with Patient 04/02/12 [phone]      Chief Complaint  Patient presents with  . Cyst    (Consider location/radiation/quality/duration/timing/severity/associated sxs/prior treatment) HPI  Past Medical History  Diagnosis Date  . Asthma   . Renal disorder     Past Surgical History  Procedure Date  . Appendectomy     Family History  Problem Relation Age of Onset  . Hypertension Mother   . Hypertension Father     History  Substance Use Topics  . Smoking status: Current Everyday Smoker  . Smokeless tobacco: Not on file  . Alcohol Use: No    OB History    Grav Para Term Preterm Abortions TAB SAB Ect Mult Living                  Review of Systems   Review of symptoms negative unless otherwise noted in HPI.   Allergies  Review of patient's allergies indicates no known allergies.  Home Medications   Current Outpatient Rx  Name Route Sig Dispense Refill  . ALBUTEROL SULFATE HFA 108 (90 BASE) MCG/ACT IN AERS Inhalation Inhale 2 puffs into the lungs every 6 (six) hours as needed. For shortness of breath.    . TYLENOL PM EXTRA STRENGTH PO Oral Take 2 tablets by mouth at bedtime as needed. For sleep/pain.      BP 129/79  Pulse 99  Temp(Src) 98.2 F (36.8 C) (Oral)  Resp 18  Ht 5\' 8"  (1.727 m)  SpO2 100%  LMP 03/16/2012  Physical Exam  Nursing note and vitals reviewed. Constitutional: She appears well-developed and well-nourished. No distress.  HENT:  Head: Normocephalic.       ~1cm crusted fluctuant lesion posterior scalp just above hairline and L of midline. No signficant surrounding skin changes. No drainage. Scattered areas of folliculitis elsewhere along hairline.  Eyes:  Conjunctivae are normal. Right eye exhibits no discharge. Left eye exhibits no discharge.  Neck: Neck supple.  Cardiovascular: Normal rate, regular rhythm and normal heart sounds.  Exam reveals no gallop and no friction rub.   No murmur heard. Pulmonary/Chest: Effort normal and breath sounds normal. No respiratory distress.  Musculoskeletal: She exhibits no edema and no tenderness.  Lymphadenopathy:    She has no cervical adenopathy.  Neurological: She is alert.  Skin: Skin is warm and dry.  Psychiatric: She has a normal mood and affect. Her behavior is normal. Thought content normal.    ED Course  Procedures (including critical care time)  INCISION AND DRAINAGE Performed by: Raeford Razor Consent: Verbal consent obtained. Risks and benefits: risks, benefits and alternatives were discussed Type: abscess  Body area: posterior scalp  Anesthesia: local infiltration  Local anesthetic: lidocaine 2% w epinephrine  Anesthetic total: 2 ml  Complexity: complex Blunt dissection to break up loculations  Drainage: purulent  Drainage amount: small  Patient tolerance: Patient tolerated the procedure well with no immediate complications.     Labs Reviewed - No data to display No results found.   1. Abscess       MDM  41 year old female with a small abscess at her hairline. Likely started as a folliculitis. No surrounding cellulitis. Incised and drained. Wound care. F/U as  needed.        Raeford Razor, MD 04/02/12 [phone]

## 2012-04-02 NOTE — ED Notes (Signed)
Per pt bump on back of head, increasing pain

## 2012-04-06 ENCOUNTER — Emergency Department (HOSPITAL_COMMUNITY)
Admission: EM | Admit: 2012-04-06 | Discharge: 2012-04-06 | Disposition: A | Discharge status: Home / Self Care | Payer: Medicaid Other | Attending: Emergency Medicine | Admitting: Emergency Medicine

## 2012-04-06 ENCOUNTER — Encounter (HOSPITAL_COMMUNITY): Payer: Self-pay | Admitting: Emergency Medicine

## 2012-04-06 DIAGNOSIS — R112 Nausea with vomiting, unspecified: Secondary | ICD-10-CM | POA: Insufficient documentation

## 2012-04-06 DIAGNOSIS — Z23 Encounter for immunization: Secondary | ICD-10-CM | POA: Insufficient documentation

## 2012-04-06 DIAGNOSIS — L03811 Cellulitis of head [any part, except face]: Secondary | ICD-10-CM

## 2012-04-06 DIAGNOSIS — J45909 Unspecified asthma, uncomplicated: Secondary | ICD-10-CM | POA: Insufficient documentation

## 2012-04-06 DIAGNOSIS — L03818 Cellulitis of other sites: Secondary | ICD-10-CM | POA: Insufficient documentation

## 2012-04-06 DIAGNOSIS — L02818 Cutaneous abscess of other sites: Secondary | ICD-10-CM | POA: Insufficient documentation

## 2012-04-06 MED ORDER — TETANUS-DIPHTH-ACELL PERTUSSIS 5-2.5-18.5 LF-MCG/0.5 IM SUSP
0.5000 mL | Freq: Once | INTRAMUSCULAR | Status: AC
  Administered 2012-04-06: 0.5 mL via INTRAMUSCULAR
  Filled 2012-04-06: qty 0.5

## 2012-04-06 MED ORDER — OXYCODONE-ACETAMINOPHEN 5-325 MG PO TABS
1.0000 | ORAL_TABLET | Freq: Once | ORAL | Status: AC
  Administered 2012-04-06: 1 via ORAL
  Filled 2012-04-06: qty 1

## 2012-04-06 MED ORDER — ONDANSETRON HCL 4 MG PO TABS: 4.0000 mg | ORAL_TABLET | Freq: Three times a day (TID) | ORAL | Status: AC | PRN

## 2012-04-06 MED ORDER — ONDANSETRON 8 MG PO TBDP
8.0000 mg | ORAL_TABLET | Freq: Once | ORAL | Status: AC
  Administered 2012-04-06: 8 mg via ORAL
  Filled 2012-04-06: qty 1

## 2012-04-06 MED ORDER — DOXYCYCLINE HYCLATE 100 MG PO CAPS: 100.0000 mg | ORAL_CAPSULE | Freq: Two times a day (BID) | ORAL | Status: AC

## 2012-04-06 MED ORDER — PERCOCET 5-325 MG PO TABS: 1.0000 | ORAL_TABLET | Freq: Four times a day (QID) | ORAL | Status: AC | PRN

## 2012-04-06 NOTE — ED Provider Notes (Signed)
History     CSN: [id number]  Arrival date & time 04/06/12  4098   First MD Initiated Contact with Patient 04/06/12 1003      Chief Complaint  Patient presents with  . Wound Check  . Nausea    (Consider location/radiation/quality/duration/timing/severity/associated sxs/prior treatment) HPI Comments: Patient recently in the ED with I&D of abscess of right occipital scalp at hairline returns today for recheck.  Pt stopped taking the bactrim several days ago because it was causing N/V.  She also stopped percocet out of concern that this was also causing nausea and vomiting.  Denies fevers.    Patient is a 41 y.o. female presenting with wound check. The history is provided by the patient and medical records.  Wound Check     Past Medical History  Diagnosis Date  . Asthma   . Renal disorder     Past Surgical History  Procedure Date  . Appendectomy     Family History  Problem Relation Age of Onset  . Hypertension Mother   . Hypertension Father     History  Substance Use Topics  . Smoking status: Current Everyday Smoker    Types: Cigarettes  . Smokeless tobacco: Not on file  . Alcohol Use: Yes    OB History    Grav Para Term Preterm Abortions TAB SAB Ect Mult Living                  Review of Systems  Constitutional: Negative for fever and chills.  Gastrointestinal: Positive for nausea and vomiting. Negative for abdominal pain.  Skin: Positive for wound.    Allergies  Bactrim  Home Medications   Current Outpatient Rx  Name Route Sig Dispense Refill  . ALBUTEROL SULFATE HFA 108 (90 BASE) MCG/ACT IN AERS Inhalation Inhale 2 puffs into the lungs every 6 (six) hours as needed. For shortness of breath.    . TYLENOL PM EXTRA STRENGTH PO Oral Take 2 tablets by mouth at bedtime as needed. For sleep/pain.    . OXYCODONE-ACETAMINOPHEN 5-325 MG PO TABS Oral Take 1 tablet by mouth every 4 (four) hours as needed for pain. 10 tablet 0  . SULFAMETHOXAZOLE-TMP DS 800-160  MG PO TABS Oral Take 1 tablet by mouth 2 (two) times daily.      BP 123/77  Pulse 108  Temp(Src) 98.2 F (36.8 C) (Oral)  Resp 18  SpO2 98%  LMP 03/16/2012  Physical Exam  Nursing note and vitals reviewed. Constitutional: She appears well-developed and well-nourished.  HENT:  Head: Normocephalic.    Neck: Neck supple.  Pulmonary/Chest: Effort normal.  Neurological: She is alert.    ED Course  Procedures (including critical care time)  Labs Reviewed - No data to display No results found.   1. Abscess or cellulitis of scalp       MDM  Afebrile patient present for recheck of scalp abscess. Pt has stopped taking her medications 3 days ago secondary to N/V after taking them.  States she otherwise feels well, no fevers.  Wound with purulent discharge matting hair, underlying erythema.  Pt is worried about taking bactrim, even with nausea medication, as patient now started and stopped, will switch to doxycycline to see if she tolerates this better, have advised taking zofran prior to taking other medications.  Pt to return for recheck in 2 days.  Return precautions given.  Patient verbalizes understanding and agrees with plan.          Rise Patience, PA  04/06/12 1140 

## 2012-04-06 NOTE — ED Notes (Signed)
Pt reports persistant pain and tenderness at site of I and D on lower, back of neck. Denies drainage

## 2012-04-06 NOTE — Discharge Instructions (Signed)
Read the information below.  Return to the ER in 2 days for a wound recheck.  If you develop fevers greater than 100.4, worsening pain, swelling, or redness, return to the ER immediately.  Take the antibiotic as prescribed, use the nausea medication about 20-30 minutes prior to taking the antibiotic to avoid nausea.  You may return to the ER at any time for worsening condition or any new symptoms that concern you.   Abscess An abscess (boil or furuncle) is an infected area under your skin. This area is filled with yellowish white fluid (pus). HOME CARE   Only take medicine as told by your doctor.   Keep the skin clean around your abscess. Keep clothes that may touch the abscess clean.   Change any bandages (dressings) as told by your doctor.   Avoid direct skin contact with other people. The infection can spread by skin contact with others.   Practice good hygiene and do not share personal care items.   Do not share athletic equipment, towels, or whirlpools. Shower after every practice or work out session.   If a draining area cannot be covered:   Do not play sports.   Children should not go to daycare until the wound has healed or until fluid (drainage) stops coming out of the wound.   See your doctor for a follow-up visit as told.  GET HELP RIGHT AWAY IF:   There is more pain, puffiness (swelling), and redness in the wound site.   There is fluid or bleeding from the wound site.   You have muscle aches, chills, fever, or feel sick.   You or your child has a temperature by mouth above 102 F (38.9 C), not controlled by medicine.   Your baby is older than 3 months with a rectal temperature of 102 F (38.9 C) or higher.  MAKE SURE YOU:   Understand these instructions.   Will watch your condition.   Will get help right away if you are not doing well or get worse.  Document Released: 04/29/2008 Document Revised: 10/31/2011 Document Reviewed: 04/29/2008 Exit[employer] Patient  Information 2012 [employer]]   .Cellulitis Cellulitis is an infection of the tissue under the skin. The infected area is usually red and tender. This is caused by germs. These germs enter the body through cuts or sores. This usually happens in the arms or lower legs. HOME CARE   Take your medicine as told. Finish it even if you start to feel better.   If the infection is on the arm or leg, keep it raised (elevated).   Use a warm cloth on the infected area several times a day.   See your doctor for a follow-up visit as told.  GET HELP RIGHT AWAY IF:   You are tired or confused.   You throw up (vomit).   You have watery poop (diarrhea).   You feel ill and have muscle aches.   You have a fever.  MAKE SURE YOU:   Understand these instructions.   Will watch your condition.   Will get help right away if you are not doing well or get worse.  Document Released: 04/29/2008 Document Revised: 10/31/2011 Document Reviewed: 10/13/2009 [facility] Patient Information 2012 [place], [place].  If you have no primary doctor, here are some resources that may be helpful:  Medicaid-accepting [facility] Providers:   - [facility]- [address]      [phone]  Mon-Fri 9am-7pm, Sat 9am-1pm   - [facility]- [address] [place], [place]      [phone]   - [facility]- [address] [place]      [phone]   [facility]- [address]      [phone]   - Renaye Rakers- [address] [place], [address]      [phone]      Only accepts [place] patients       after they have her name applied to their card   Self Pay (no insurance) in [place]:   - Sickle Cell Patients: Dr Willey Blade, [facility]      [address] [place]      [phone]   - [phone]   - Physician Referral Service- [phone]   - [facility]- [address] [place]      [phone]   [facility]- [address]   - [facility]- see information above      (Speak to [employer] if you do not have insurance)   - [facility]- [address] [place]      [phone]   - [facility] [place]- [address]      [phone]   - [facility]- [address]      [phone]   - Dr Julio Sicks-  [address], [place]      [phone]   - [facility]- [address]      [phone]   - [facility]- [address]      [phone]      Also [address]      [phone]   - [facility]- [address]      [phone]      1st and 3rd Saturday every month, 10am-1pm Other agencies that provide inexpensive medical care:    [facility]  [phone]    [facility]  [phone]    [facility]  [phone]    [facility]  [phone]    [facility]  [phone]  General Information: Finding a doctor when you do not have health insurance can be tricky. Although you are not limited by an insurance plan, you are of course limited by her finances and how much but he can pay out of pocket.  What are your options if you don't have health insurance?   1) Find a Librarian, academic and Pay Out of Pocket Although you won't have to find out who is covered by your insurance plan, it is a good idea to ask around and get recommendations. You will then need to call the office and see if the doctor you have chosen will accept you as a new patient and what types of options they offer for patients who are self-pay. Some doctors offer discounts or will set up payment plans for their patients who do not have insurance, but you will need to ask so you aren't surprised when you get to your appointment.  2) Contact Your Local Health Department Not all health departments have doctors that can see patients for sick visits, but many do, so it  is worth a call to see if yours does. If you don't know where your local health department  is, you can check in your phone book. The CDC also has a tool to help you locate your state's health department, and many state websites also have listings of all of their local health departments.  3) Find a Walk-in Clinic If your illness is not likely to be very severe or complicated, you may want to try a walk in clinic. These are popping up all over the country in pharmacies, drugstores, and shopping centers. They're usually staffed by nurse practitioners or physician assistants that have been trained to treat common illnesses and complaints. They're usually fairly quick and inexpensive. However, if you have serious medical issues or chronic medical problems, these are probably not your best option

## 2012-04-09 ENCOUNTER — Emergency Department (HOSPITAL_COMMUNITY)
Admission: EM | Admit: 2012-04-09 | Discharge: 2012-04-09 | Disposition: A | Discharge status: Home / Self Care | Payer: Medicaid Other | Attending: Emergency Medicine | Admitting: Emergency Medicine

## 2012-04-09 ENCOUNTER — Encounter (HOSPITAL_COMMUNITY): Payer: Self-pay | Admitting: Emergency Medicine

## 2012-04-09 DIAGNOSIS — Z79899 Other long term (current) drug therapy: Secondary | ICD-10-CM | POA: Insufficient documentation

## 2012-04-09 DIAGNOSIS — L02811 Cutaneous abscess of head [any part, except face]: Secondary | ICD-10-CM

## 2012-04-09 DIAGNOSIS — J45909 Unspecified asthma, uncomplicated: Secondary | ICD-10-CM | POA: Insufficient documentation

## 2012-04-09 DIAGNOSIS — L02818 Cutaneous abscess of other sites: Secondary | ICD-10-CM | POA: Insufficient documentation

## 2012-04-09 DIAGNOSIS — F17200 Nicotine dependence, unspecified, uncomplicated: Secondary | ICD-10-CM | POA: Insufficient documentation

## 2012-04-09 DIAGNOSIS — R51 Headache: Secondary | ICD-10-CM | POA: Insufficient documentation

## 2012-04-09 MED ORDER — IBUPROFEN 800 MG PO TABS: 800.0000 mg | ORAL_TABLET | Freq: Three times a day (TID) | ORAL | Status: AC

## 2012-04-09 MED ORDER — HYDROCODONE-ACETAMINOPHEN 5-325 MG PO TABS: 1.0000 | ORAL_TABLET | Freq: Four times a day (QID) | ORAL | Status: AC | PRN

## 2012-04-09 NOTE — ED Notes (Signed)
Pt presenting to ed with c/o wound recheck to back of her head

## 2012-04-09 NOTE — ED Notes (Signed)
Pt presenting to ed with c/o wound recheck to the back of her head. Pt states seen here Monday for the same and was told to follow up today. Pt states she thinks pain medications are making her nauseous and nausea medicine is not working. Pt requesting new medications.

## 2012-04-09 NOTE — ED Provider Notes (Signed)
History     CSN: [id number]  Arrival date & time 04/09/12  0744   First MD Initiated Contact with Patient 04/09/12 0754      8:15 AM HPI Patient reports an abscess on the back of her head for several days. Had I&D on Monday and was advised to return in 2 days. Reports wound is healing but still has significant pain. States that she stopped taking her her cassettes because it was making her too nauseous. Reports still taking doxycycline for infection. Denies prior allergy to Vicodin.  Patient is a 41 y.o. female presenting with wound check. The history is provided by the patient.  Wound Check  She was treated in the ED 2 to 3 days ago. Previous treatment in the ED includes I&D of abscess. Treatments since wound repair include oral antibiotics and regular soap and water washings. There has been no drainage from the wound. The redness has improved. The swelling has improved. The pain has not changed.    Past Medical History  Diagnosis Date  . Asthma   . Renal disorder     Past Surgical History  Procedure Date  . Appendectomy     Family History  Problem Relation Age of Onset  . Hypertension Mother   . Hypertension Father     History  Substance Use Topics  . Smoking status: Current Everyday Smoker    Types: Cigarettes  . Smokeless tobacco: Not on file  . Alcohol Use: Yes    OB History    Grav Para Term Preterm Abortions TAB SAB Ect Mult Living                  Review of Systems  Constitutional: Negative for fever and chills.  HENT: Negative for rhinorrhea.   Eyes: Negative for redness.  Respiratory: Negative for cough, shortness of breath and wheezing.   Cardiovascular: Negative for chest pain and palpitations.  Gastrointestinal: Negative for nausea.  Skin:       Abscess  All other systems reviewed and are negative.    Allergies  Bactrim  Home Medications   Current Outpatient Rx  Name Route Sig Dispense Refill  . ALBUTEROL SULFATE HFA 108 (90 BASE)  MCG/ACT IN AERS Inhalation Inhale 2 puffs into the lungs every 6 (six) hours as needed. For shortness of breath.    . TYLENOL PM EXTRA STRENGTH PO Oral Take 2 tablets by mouth at bedtime as needed. For sleep/pain.    Marland Kitchen DOXYCYCLINE HYCLATE 100 MG PO CAPS Oral Take 1 capsule (100 mg total) by mouth 2 (two) times daily. 20 capsule 0  . ONDANSETRON HCL 4 MG PO TABS Oral Take 1 tablet (4 mg total) by mouth every 8 (eight) hours as needed for nausea. 12 tablet 0  . OXYCODONE-ACETAMINOPHEN 5-325 MG PO TABS Oral Take 1 tablet by mouth every 4 (four) hours as needed for pain. 10 tablet 0  . PERCOCET 5-325 MG PO TABS Oral Take 1 tablet by mouth every 6 (six) hours as needed for pain. 15 tablet 0    Dispense as written.  . SULFAMETHOXAZOLE-TMP DS 800-160 MG PO TABS Oral Take 1 tablet by mouth 2 (two) times daily.      BP 131/88  Pulse 113  Temp(Src) 98.2 F (36.8 C) (Oral)  Resp 18  Ht 5\' 8"  (1.727 m)  Wt 199 lb 3.2 oz (90.357 kg)  BMI 30.29 kg/m2  SpO2 98%  LMP 03/16/2012  Physical Exam  Vitals reviewed. Constitutional: She is oriented  to person, place, and time. Vital signs are normal. She appears well-developed and well-nourished. No distress.  HENT:  Head: Normocephalic and atraumatic.  Eyes: Conjunctivae are normal. Right eye exhibits no discharge. Left eye exhibits no discharge.  Neck: Neck supple.  Pulmonary/Chest: Effort normal.  Lymphadenopathy:    She has no cervical adenopathy.  Neurological: She is alert and oriented to person, place, and time.  Skin: Skin is warm and dry. No rash noted. No erythema. No pallor.       Right posterior scalp has a 2 cm healing incision site from I and D. Mild surrounding erythema but no fluctuance. TTP. No drainage.   Psychiatric: She has a normal mood and affect. Her behavior is normal.    ED Course  Procedures   MDM   Patient advised to continue taking doxycycline. We'll prescribe patient Vicodin and ibuprofen.  Advised once completing  Vicodin to transition to ibuprofen. Will also refer patient to surgeon for worsening symptoms if needed. Patient voices understanding and is ready for discharge     Thomasene Lot, Cordelia Poche 04/09/12 4098

## 2012-04-09 NOTE — ED Provider Notes (Signed)
Medical screening examination/treatment/procedure(s) were performed by non-physician practitioner and as supervising physician I was immediately available for consultation/collaboration.  Delorice Bannister, MD 04/09/12 1559 

## 2012-04-09 NOTE — Discharge Instructions (Signed)

## 2012-04-13 NOTE — ED Provider Notes (Signed)
Medical screening examination/treatment/procedure(s) were performed by non-physician practitioner and as supervising physician I was immediately available for consultation/collaboration.   Suzi Roots, MD 04/13/12 1728

## 2012-05-11 ENCOUNTER — Emergency Department (HOSPITAL_COMMUNITY)
Admission: EM | Admit: 2012-05-11 | Discharge: 2012-05-11 | Disposition: A | Discharge status: Home / Self Care | Payer: Medicaid Other | Attending: Emergency Medicine | Admitting: Emergency Medicine

## 2012-05-11 ENCOUNTER — Encounter (HOSPITAL_COMMUNITY): Payer: Self-pay | Admitting: Emergency Medicine

## 2012-05-11 DIAGNOSIS — F17200 Nicotine dependence, unspecified, uncomplicated: Secondary | ICD-10-CM | POA: Insufficient documentation

## 2012-05-11 DIAGNOSIS — S161XXA Strain of muscle, fascia and tendon at neck level, initial encounter: Secondary | ICD-10-CM

## 2012-05-11 DIAGNOSIS — S139XXA Sprain of joints and ligaments of unspecified parts of neck, initial encounter: Secondary | ICD-10-CM | POA: Insufficient documentation

## 2012-05-11 DIAGNOSIS — X58XXXA Exposure to other specified factors, initial encounter: Secondary | ICD-10-CM | POA: Insufficient documentation

## 2012-05-11 DIAGNOSIS — J45909 Unspecified asthma, uncomplicated: Secondary | ICD-10-CM | POA: Insufficient documentation

## 2012-05-11 DIAGNOSIS — Z8249 Family history of ischemic heart disease and other diseases of the circulatory system: Secondary | ICD-10-CM | POA: Insufficient documentation

## 2012-05-11 MED ORDER — CYCLOBENZAPRINE HCL 10 MG PO TABS: 10.0000 mg | ORAL_TABLET | Freq: Three times a day (TID) | ORAL | Status: AC | PRN

## 2012-05-11 MED ORDER — HYDROCODONE-ACETAMINOPHEN 5-325 MG PO TABS: 1.0000 | ORAL_TABLET | Freq: Four times a day (QID) | ORAL | Status: AC | PRN

## 2012-05-11 MED ORDER — PREDNISONE 50 MG PO TABS: 50.0000 mg | ORAL_TABLET | Freq: Every day | ORAL | Status: AC

## 2012-05-11 MED ORDER — KETOROLAC TROMETHAMINE 60 MG/2ML IM SOLN
60.0000 mg | Freq: Once | INTRAMUSCULAR | Status: AC
  Administered 2012-05-11: 60 mg via INTRAMUSCULAR
  Filled 2012-05-11: qty 2

## 2012-05-11 NOTE — ED Provider Notes (Signed)
History     CSN: [id number]  Arrival date & time 05/11/12  1113   First MD Initiated Contact with Patient 05/11/12 1256      Chief Complaint  Patient presents with  . Neck Pain    (Consider location/radiation/quality/duration/timing/severity/associated sxs/prior treatment) HPI Patient presents emergency department with neck pain that began last Wednesday.  Patient, states she sneezed, and coughed and at that she developed a tightness and pain in her right neck.  Patient, states it is spreading across to the other side of her neck.  Patient denies numbness, weakness, nausea, vomiting, gait disturbance, or fever.  Patient, states she has tried no medications other than Tylenol at home without relief. Past Medical History  Diagnosis Date  . Asthma   . Renal disorder     Past Surgical History  Procedure Date  . Appendectomy     Family History  Problem Relation Age of Onset  . Hypertension Mother   . Hypertension Father     History  Substance Use Topics  . Smoking status: Current Everyday Smoker -- 1.0 packs/day    Types: Cigarettes  . Smokeless tobacco: Not on file  . Alcohol Use: Yes    OB History    Grav Para Term Preterm Abortions TAB SAB Ect Mult Living                  Review of Systems All other systems negative except as documented in the HPI. All pertinent positives and negatives as reviewed in the HPI.  Allergies  Bactrim  Home Medications   Current Outpatient Rx  Name Route Sig Dispense Refill  . ALBUTEROL SULFATE HFA 108 (90 BASE) MCG/ACT IN AERS Inhalation Inhale 2 puffs into the lungs every 6 (six) hours as needed. For shortness of breath.    . TYLENOL PM EXTRA STRENGTH PO Oral Take 2 tablets by mouth at bedtime as needed. For sleep/pain.    Marland Kitchen SALINE NASAL SPRAY 0.65 % NA SOLN Nasal Place 1 spray into the nose as needed. Congestion      BP 126/80  Pulse 89  Temp 98.2 F (36.8 C) (Oral)  Resp 20  SpO2 96%  LMP 05/05/2012  Physical Exam    Constitutional: She is oriented to person, place, and time. She appears well-developed and well-nourished. She appears distressed.  Cardiovascular: Normal rate and regular rhythm.   Pulmonary/Chest: Effort normal and breath sounds normal.  Musculoskeletal:       Cervical back: She exhibits tenderness, pain and spasm. She exhibits normal range of motion, no bony tenderness, no swelling and no deformity.       Back:  Neurological: She is alert and oriented to person, place, and time. She has normal strength. No sensory deficit. Gait normal.  Reflex Scores:      Tricep reflexes are 2+ on the right side and 2+ on the left side.      Bicep reflexes are 2+ on the right side and 2+ on the left side.      Brachioradialis reflexes are 2+ on the right side and 2+ on the left side.   ED Course  Procedures (including critical care time)  Patient be treated for muscular strain based on her mechanism of injury from a cough.  Patient is advised return if any worsening in her condition.  She is told to use ice and heat on her neck.   MDM          Carlyle Dolly, PA-C 05/11/12 1410

## 2012-05-11 NOTE — ED Notes (Addendum)
Pt presenting to ed with c/o cough and developed neck pain pt states pain is worse at this time. Pt states pain is not going anywhere. Pt states pain is in the back of her neck. Pt states no chest pain, no pain radiating down her arms. Pt denies pain radiating into her jaw. Pt is alert and oriented at this time pt denies nausea and vomiting. Pt states pain is worse with movement

## 2012-05-11 NOTE — ED Provider Notes (Signed)
Medical screening examination/treatment/procedure(s) were performed by non-physician practitioner and as supervising physician I was immediately available for consultation/collaboration.   Lyanne Co, MD 05/11/12 [phone]

## 2012-05-11 NOTE — Discharge Instructions (Signed)
Ice and heat to your neck. Return here as needed.

## 2012-05-24 ENCOUNTER — Emergency Department (HOSPITAL_COMMUNITY)
Admission: EM | Admit: 2012-05-24 | Discharge: 2012-05-24 | Disposition: A | Discharge status: Home / Self Care | Payer: Medicaid Other | Attending: Emergency Medicine | Admitting: Emergency Medicine

## 2012-05-24 ENCOUNTER — Emergency Department (HOSPITAL_COMMUNITY): Payer: Medicaid Other

## 2012-05-24 ENCOUNTER — Encounter (HOSPITAL_COMMUNITY): Payer: Self-pay | Admitting: *Deleted

## 2012-05-24 DIAGNOSIS — M542 Cervicalgia: Secondary | ICD-10-CM | POA: Insufficient documentation

## 2012-05-24 DIAGNOSIS — F17200 Nicotine dependence, unspecified, uncomplicated: Secondary | ICD-10-CM | POA: Insufficient documentation

## 2012-05-24 DIAGNOSIS — M25519 Pain in unspecified shoulder: Secondary | ICD-10-CM | POA: Insufficient documentation

## 2012-05-24 DIAGNOSIS — J45909 Unspecified asthma, uncomplicated: Secondary | ICD-10-CM | POA: Insufficient documentation

## 2012-05-24 MED ORDER — CYCLOBENZAPRINE HCL 10 MG PO TABS
5.0000 mg | ORAL_TABLET | Freq: Once | ORAL | Status: AC
  Administered 2012-05-24: 5 mg via ORAL
  Filled 2012-05-24: qty 1

## 2012-05-24 MED ORDER — PREDNISONE 20 MG PO TABS
60.0000 mg | ORAL_TABLET | Freq: Once | ORAL | Status: AC
  Administered 2012-05-24: 60 mg via ORAL
  Filled 2012-05-24: qty 3

## 2012-05-24 MED ORDER — OXYCODONE-ACETAMINOPHEN 5-325 MG PO TABS
1.0000 | ORAL_TABLET | Freq: Once | ORAL | Status: AC
  Administered 2012-05-24: 1 via ORAL
  Filled 2012-05-24: qty 1

## 2012-05-24 MED ORDER — CYCLOBENZAPRINE HCL 10 MG PO TABS: 10.0000 mg | ORAL_TABLET | Freq: Two times a day (BID) | ORAL | Status: AC | PRN

## 2012-05-24 MED ORDER — HYDROCODONE-ACETAMINOPHEN 5-500 MG PO TABS: 1.0000 | ORAL_TABLET | Freq: Four times a day (QID) | ORAL | Status: AC | PRN

## 2012-05-24 NOTE — ED Provider Notes (Signed)
History     CSN: [id number]  Arrival date & time 05/24/12  1245   First MD Initiated Contact with Patient 05/24/12 1635      Chief Complaint  Patient presents with  . Shoulder Pain  . Neck Pain    (Consider location/radiation/quality/duration/timing/severity/associated sxs/prior treatment) HPI  Pt presents to the emergency department with complaints of right shoulder pain that radiates down to her right arm into her fingers. She has been seen here for the same she says that her pain has not changed. She states the medicine she was given helps well she had some nausea now her pain has come back. She does admit to her fourth and fifth digit feeling tingly. She denies numbness but admits to pain. She denies her hand changing colors appearing blue or white, she denies her hand becoming cold or her arm swelling. She denies CP or SOB.  Past Medical History  Diagnosis Date  . Asthma   . Renal disorder     Past Surgical History  Procedure Date  . Appendectomy     Family History  Problem Relation Age of Onset  . Hypertension Mother   . Hypertension Father     History  Substance Use Topics  . Smoking status: Current Everyday Smoker -- 1.0 packs/day    Types: Cigarettes  . Smokeless tobacco: Not on file  . Alcohol Use: Yes    OB History    Grav Para Term Preterm Abortions TAB SAB Ect Mult Living                  Review of Systems   HEENT: denies blurry vision or change in hearing PULMONARY: Denies difficulty breathing and SOB CARDIAC: denies chest pain or heart palpitations MUSCULOSKELETAL:  denies being unable to ambulate ABDOMEN AL: denies abdominal pain GU: denies loss of bowel or urinary control NEURO: admits to intermittent numbness and tingling in extremities      Allergies  Bactrim  Home Medications   Current Outpatient Rx  Name Route Sig Dispense Refill  . ALBUTEROL SULFATE HFA 108 (90 BASE) MCG/ACT IN AERS Inhalation Inhale 2 puffs into the lungs  every 6 (six) hours as needed. For shortness of breath.    . TYLENOL PM EXTRA STRENGTH PO Oral Take 2 tablets by mouth at bedtime as needed. For sleep/pain.    . CYCLOBENZAPRINE HCL 10 MG PO TABS Oral Take 1 tablet (10 mg total) by mouth 2 (two) times daily as needed for muscle spasms. 20 tablet 0  . HYDROCODONE-ACETAMINOPHEN 5-500 MG PO TABS Oral Take 1 tablet by mouth every 6 (six) hours as needed for pain. 15 tablet 0    BP 143/80  Pulse 105  Temp 98.1 F (36.7 C) (Oral)  Resp 18  SpO2 94%  LMP 04/27/2012  Physical Exam  Nursing note and vitals reviewed. Constitutional: She appears well-developed and well-nourished. No distress.  HENT:  Head: Normocephalic and atraumatic.  Eyes: Pupils are equal, round, and reactive to light.  Neck: Normal range of motion. Neck supple.  Cardiovascular: Normal rate and regular rhythm.   Pulmonary/Chest: Effort normal.  Abdominal: Soft.  Musculoskeletal:       Right shoulder: She exhibits decreased range of motion, tenderness, pain and spasm. She exhibits no bony tenderness, no swelling, no effusion, no crepitus, no deformity, no laceration, normal pulse and normal strength.       Cervical back: Normal. She exhibits normal range of motion and no tenderness.  Neurological: She is alert.  Skin: Skin is warm and dry.    ED Course  Procedures (including critical care time)  Labs Reviewed - No data to display Dg Shoulder Right  05/24/2012  *RADIOLOGY REPORT*  Clinical Data: Shoulder pain and neck pain  RIGHT SHOULDER - 2+ VIEW  Comparison: None.  Findings: There is no evidence of fracture or dislocation.  There is no evidence of arthropathy or other focal bone abnormality. Soft tissues are unremarkable.  IMPRESSION: Negative exam.  Original Report Authenticated By: Rosealee Albee, M.D.     1. Shoulder pain       MDM  Shoulder xray normal. Pt pain appears to be nerve related. She has good pulses, good strength and good touch sensation. I  have refilled the patients pain medication but feel she needs to see a orthopedist therefore I have given her a referral.  Patient with back pain. No neurological deficits. Patient is ambulatory. No warning symptoms of back pain including: loss of bowel or bladder control, night sweats, waking from sleep with back pain, unexplained fevers or weight loss, h/o cancer, IVDU, recent trauma. No concern for cauda equina, epidural abscess, or other serious cause of back pain. Conservative measures such as rest, ice/heat and pain medicine indicated with PCP follow-up if no improvement with conservative management.           Dorthula Matas, PA 05/24/12 1724

## 2012-05-24 NOTE — ED Notes (Signed)
Patient is alert and oriented x3.  She is complaining of pain that has increased in severity since this last time that she was here 2 weeks ago. She rates her pain 10 of 10 and has noted additional numbness in the right hand ring and pinky fingers.  Patient denies nausea or vomiting

## 2012-05-24 NOTE — ED Notes (Signed)
Right shoulder and neck pain, seen here approx two weeks ago for same

## 2012-05-24 NOTE — Discharge Instructions (Signed)
Acromioclavicular Injuries  The AC (acromioclavicular) joint is the joint in the shoulder where the collarbone (clavicle) meets the shoulder blade (scapula). The part of the shoulder blade connected to the collarbone is called the acromion. Common problems with and treatments for the AC joint are detailed below.  ARTHRITIS  Arthritis occurs when the joint has been injured and the smooth padding between the joints (cartilage) is lost. This is the wear and tear seen in most joints of the body if they have been overused. This causes the joint to produce pain and swelling which is worse with activity.   AC JOINT SEPARATION  AC joint separation means that the ligaments connecting the acromion of the shoulder blade and collarbone have been damaged, and the two bones no longer line up. AC separations can be anywhere from mild to severe, and are "graded" depending upon which ligaments are torn and how badly they are torn.   Grade I Injury: the least damage is done, and the AC joint still lines up.   Grade II Injury: damage to the ligaments which reinforce the AC joint. In a Grade II injury, these ligaments are stretched but not entirely torn. When stressed, the AC joint becomes painful and unstable.   Grade III Injury: AC and secondary ligaments are completely torn, and the collarbone is no longer attached to the shoulder blade. This results in deformity; a prominence of the end of the clavicle.  AC JOINT FRACTURE  AC joint fracture means that there has been a break in the bones of the AC joint, usually the end of the clavicle.  TREATMENT  TREATMENT OF AC ARTHRITIS   There is currently no way to replace the cartilage damaged by arthritis. The best way to improve the condition is to decrease the activities which aggravate the problem. Application of ice to the joint helps decrease pain and soreness (inflammation). The use of non-steroidal anti-inflammatory medication is helpful.   If less conservative measures do not  work, then cortisone shots (injections) may be used. These are anti-inflammatories; they decrease the soreness in the joint and swelling.   If non-surgical measures fail, surgery may be recommended. The procedure is generally removal of a portion of the end of the clavicle. This is the part of the collarbone closest to your acromion which is stabilized with ligaments to the acromion of the shoulder blade. This surgery may be performed using a tube-like instrument with a light (arthroscope) for looking into a joint. It may also be performed as an open surgery through a small incision by the surgeon. Most patients will have good range of motion within 6 weeks and may return to all activity including sports by 8-12 weeks, barring complications.  TREATMENT OF AN AC SEPARATION   The initial treatment is to decrease pain. This is best accomplished by immobilizing the arm in a sling and placing an ice pack to the shoulder for 20 to 30 minutes every 2 hours as needed. As the pain starts to subside, it is important to begin moving the fingers, wrist, elbow and eventually the shoulder in order to prevent a stiff or "frozen" shoulder. Instruction on when and how much to move the shoulder will be provided by your caregiver. The length of time needed to regain full motion and function depends on the amount or grade of the injury. Recovery from a Grade I AC separation usually takes 10 to 14 days, whereas a Grade III may take 6 to 8 weeks.   Grade   III injuries usually allow return to full activity with few restrictions. Treatment is also based on the activity demands of the injured shoulder. For example, a high level quarterback with an injured throwing arm will receive more aggressive treatment than someone with a desk job who rarely uses his/her arm for strenuous activities. In some cases, a painful lump may persist which could require a later surgery.  Surgery can be very successful, but the benefits must be weighed against the potential risks.  TREATMENT OF AN AC JOINT FRACTURE Fracture treatment depends on the type of fracture. Sometimes a splint or sling may be all that is required. Other times surgery may be required for repair. This is more frequently the case when the ligaments supporting the clavicle are completely torn. Your caregiver will help you with these decisions and together you can decide what will be the best treatment. HOME CARE INSTRUCTIONS   Apply ice to the injury for 15 to 20 minutes each hour while awake for 2 days. Put the ice in a plastic bag and place a towel between the bag of ice and skin.   If a sling has been applied, wear it constantly for as long as directed by your caregiver, even at night. The sling or splint can be removed for bathing or showering or as directed. Be sure to keep the shoulder in the same place as when the sling is on. Do not lift the arm.   If a figure-of-eight splint has been applied it should be tightened gently by another person every day. Tighten it enough to keep the shoulders held back. Allow enough room to place the index finger between the body and strap. Loosen the splint immediately if there is numbness or tingling in the hands.   Take over-the-counter or prescription medicines for pain, discomfort or fever as directed by your caregiver.   If you or your child has received a follow up appointment, it is very important to keep that appointment in order to avoid long term complications, chronic pain or disability.  SEEK MEDICAL CARE IF:   The pain is not relieved with medications.   There is increased swelling or discoloration that continues to get worse rather than better.   You or your child has been unable to follow up as instructed.   There is progressive numbness and tingling in the arm, forearm or hand.  SEEK IMMEDIATE MEDICAL CARE IF:   The arm is numb, cold or pale.    There is increasing pain in the hand, forearm or fingers.  MAKE SURE YOU:   Understand these instructions.   Will watch your condition.   Will get help right away if you are not doing well or get worse.  Document Released: 08/21/2005 Document Revised: 10/31/2011 Document Reviewed: 02/13/2009 [facility] Patient Information 2012 [place], [place].Arthralgia Your caregiver has diagnosed you as suffering from an arthralgia. Arthralgia means there is pain in a joint. This can come from many reasons including:  Bruising the joint which causes soreness (inflammation) in the joint.   Wear and tear on the joints which occur as we grow older (osteoarthritis).   Overusing the joint.   Various forms of arthritis.   Infections of the joint.  Regardless of the cause of pain in your joint, most of these different pains respond to anti-inflammatory drugs and rest. The exception to this is when a joint is infected, and these cases are treated with antibiotics, if it is a bacterial infection. HOME CARE INSTRUCTIONS  Rest the injured area for as long as directed by your caregiver. Then slowly start using the joint as directed by your caregiver and as the pain allows. Crutches as directed may be useful if the ankles, knees or hips are involved. If the knee was splinted or casted, continue use and care as directed. If an stretchy or elastic wrapping bandage has been applied today, it should be removed and re-applied every 3 to 4 hours. It should not be applied tightly, but firmly enough to keep swelling down. Watch toes and feet for swelling, bluish discoloration, coldness, numbness or excessive pain. If any of these problems (symptoms) occur, remove the ace bandage and re-apply more loosely. If these symptoms persist, contact your caregiver or return to this location.   For the first 24 hours, keep the injured extremity elevated on pillows while lying down.   Apply ice for 15 to 20 minutes to the sore joint  every couple hours while awake for the first half day. Then 3 to 4 times per day for the first 48 hours. Put the ice in a plastic bag and place a towel between the bag of ice and your skin.   Wear any splinting, casting, elastic bandage applications, or slings as instructed.   Only take over-the-counter or prescription medicines for pain, discomfort, or fever as directed by your caregiver. Do not use aspirin immediately after the injury unless instructed by your physician. Aspirin can cause increased bleeding and bruising of the tissues.   If you were given crutches, continue to use them as instructed and do not resume weight bearing on the sore joint until instructed.  Persistent pain and inability to use the sore joint as directed for more than 2 to 3 days are warning signs indicating that you should see a caregiver for a follow-up visit as soon as possible. Initially, a hairline fracture (break in bone) may not be evident on X-rays. Persistent pain and swelling indicate that further evaluation, non-weight bearing or use of the joint (use of crutches or slings as instructed), or further X-rays are indicated. X-rays may sometimes not show a small fracture until a week or 10 days later. Make a follow-up appointment with your own caregiver or one to whom we have referred you. A radiologist (specialist in reading X-rays) may read your X-rays. Make sure you know how you are to obtain your X-ray results. Do not assume everything is normal if you do not hear from [place]. SEEK MEDICAL CARE IF: Bruising, swelling, or pain increases. SEEK IMMEDIATE MEDICAL CARE IF:   Your fingers or toes are numb or blue.   The pain is not responding to medications and continues to stay the same or get worse.   The pain in your joint becomes severe.   You develop a fever over 102 F (38.9 C).   It becomes impossible to move or use the joint.  MAKE SURE YOU:   Understand these instructions.   Will watch your condition.     Will get help right away if you are not doing well or get worse.  Document Released: 11/11/2005 Document Revised: 10/31/2011 Document Reviewed: 06/29/2008 [facility] Patient Information 2012 [place], [place].

## 2012-05-25 NOTE — ED Provider Notes (Signed)
Medical screening examination/treatment/procedure(s) were performed by non-physician practitioner and as supervising physician I was immediately available for consultation/collaboration.  Timoth Schara, MD 05/25/12 0036 

## 2012-08-08 ENCOUNTER — Emergency Department (HOSPITAL_COMMUNITY)
Admission: EM | Admit: 2012-08-08 | Discharge: 2012-08-08 | Disposition: A | Discharge status: Home / Self Care | Payer: Medicaid Other | Attending: Emergency Medicine | Admitting: Emergency Medicine

## 2012-08-08 ENCOUNTER — Encounter (HOSPITAL_COMMUNITY): Payer: Self-pay | Admitting: Emergency Medicine

## 2012-08-08 DIAGNOSIS — F17200 Nicotine dependence, unspecified, uncomplicated: Secondary | ICD-10-CM | POA: Insufficient documentation

## 2012-08-08 DIAGNOSIS — J45901 Unspecified asthma with (acute) exacerbation: Secondary | ICD-10-CM | POA: Insufficient documentation

## 2012-08-08 MED ORDER — PREDNISONE 20 MG PO TABS
60.0000 mg | ORAL_TABLET | Freq: Once | ORAL | Status: AC
  Administered 2012-08-08: 60 mg via ORAL
  Filled 2012-08-08: qty 3

## 2012-08-08 MED ORDER — ALBUTEROL SULFATE (5 MG/ML) 0.5% IN NEBU
2.5000 mg | INHALATION_SOLUTION | Freq: Once | RESPIRATORY_TRACT | Status: AC
  Administered 2012-08-08: 2.5 mg via RESPIRATORY_TRACT
  Filled 2012-08-08: qty 0.5

## 2012-08-08 MED ORDER — PREDNISONE 10 MG PO TABS: 20.0000 mg | ORAL_TABLET | Freq: Every day | ORAL | Status: DC

## 2012-08-08 MED ORDER — IPRATROPIUM BROMIDE 0.02 % IN SOLN
0.5000 mg | Freq: Once | RESPIRATORY_TRACT | Status: AC
  Administered 2012-08-08: 0.5 mg via RESPIRATORY_TRACT
  Filled 2012-08-08: qty 2.5

## 2012-08-08 MED ORDER — ALBUTEROL SULFATE (5 MG/ML) 0.5% IN NEBU
INHALATION_SOLUTION | RESPIRATORY_TRACT | Status: AC
  Administered 2012-08-08: 11:00:00
  Filled 2012-08-08: qty 0.5

## 2012-08-08 MED ORDER — ALBUTEROL SULFATE (5 MG/ML) 0.5% IN NEBU
2.5000 mg | INHALATION_SOLUTION | Freq: Once | RESPIRATORY_TRACT | Status: AC
  Administered 2012-08-08: 2.5 mg via RESPIRATORY_TRACT

## 2012-08-08 MED ORDER — ALBUTEROL SULFATE HFA 108 (90 BASE) MCG/ACT IN AERS
2.0000 | INHALATION_SPRAY | RESPIRATORY_TRACT | Status: DC | PRN
  Filled 2012-08-08: qty 6.7

## 2012-08-08 NOTE — ED Notes (Signed)
Upon beginning shift, RN Taquita confirmed pt. did not need to be on cardiac monitor.

## 2012-08-08 NOTE — ED Notes (Signed)
Pt presents w/ respiratory distress, does not have an inhaler. Noticed difficulty 2 days ago, tightness and congestion. Breathing much harder this morning, dizziness, and numbness in fingers.

## 2012-08-08 NOTE — ED Provider Notes (Signed)
History     CSN: [id number]  Arrival date & time 08/08/12  4098   First MD Initiated Contact with Patient 08/08/12 [phone]      Chief Complaint  Patient presents with  . Shortness of Breath    (Consider location/radiation/quality/duration/timing/severity/associated sxs/prior treatment) HPI  Patient with history asthma for 10 years now with increased wheezing since last night.  Out of inhaler for several months.  Nonproductive cough, some finger and toe tingling.  No fever or chills.  PMD -none, patient has had inhalers prescribed here.  Patient smokes 1.5 packs/ day.   Past Medical History  Diagnosis Date  . Asthma   . Renal disorder     kidney infections    Past Surgical History  Procedure Date  . Appendectomy   . Tubal ligation     Family History  Problem Relation Age of Onset  . Hypertension Mother   . Hypertension Father     History  Substance Use Topics  . Smoking status: Current Every Day Smoker -- 1.5 packs/day    Types: Cigarettes  . Smokeless tobacco: Never Used  . Alcohol Use: Yes     socially    OB History    Grav Para Term Preterm Abortions TAB SAB Ect Mult Living                  Review of Systems  Constitutional: Negative for fever, chills, activity change, appetite change and unexpected weight change.  HENT: Negative for sore throat, rhinorrhea, neck pain, neck stiffness and sinus pressure.   Eyes: Negative for visual disturbance.  Respiratory: Negative for cough and shortness of breath.   Cardiovascular: Negative for chest pain and leg swelling.  Gastrointestinal: Negative for vomiting, abdominal pain, diarrhea and blood in stool.  Genitourinary: Negative for dysuria, urgency, frequency, vaginal discharge and difficulty urinating.  Musculoskeletal: Negative for myalgias, arthralgias and gait problem.  Skin: Negative for color change and rash.  Neurological: Negative for weakness, light-headedness and headaches.  Hematological: Does not  bruise/bleed easily.  Psychiatric/Behavioral: Negative for dysphoric mood.    Allergies  Bactrim  Home Medications   Current Outpatient Rx  Name Route Sig Dispense Refill  . ALBUTEROL SULFATE HFA 108 (90 BASE) MCG/ACT IN AERS Inhalation Inhale 2 puffs into the lungs every 6 (six) hours as needed. For shortness of breath.    . CYCLOBENZAPRINE HCL 5 MG PO TABS Oral Take 5 mg by mouth at bedtime as needed. Muscle spasms    . TYLENOL PM EXTRA STRENGTH PO Oral Take 2 tablets by mouth at bedtime as needed. For sleep/pain.    Marland Kitchen FLUTICASONE PROPIONATE 50 MCG/ACT NA SUSP Nasal Place 2 sprays into the nose daily.      BP 103/80  Pulse 112  Temp 98.1 F (36.7 C) (Oral)  Resp 18  Ht 5\' 8"  (1.727 m)  Wt 194 lb (87.998 kg)  BMI 29.50 kg/m2  SpO2 98%  LMP 07/14/2012  Physical Exam  Nursing note and vitals reviewed. Constitutional: She appears well-developed and well-nourished.  HENT:  Head: Normocephalic and atraumatic.  Eyes: Conjunctivae normal and EOM are normal. Pupils are equal, round, and reactive to light.  Neck: Normal range of motion. Neck supple.  Cardiovascular: Normal rate, regular rhythm, normal heart sounds and intact distal pulses.   Pulmonary/Chest: Effort normal and breath sounds normal.  Abdominal: Soft. Bowel sounds are normal.  Musculoskeletal: Normal range of motion.  Neurological: She is alert.  Skin: Skin is warm and dry.  Psychiatric:  She has a normal mood and affect. Thought content normal.    ED Course  Procedures (including critical care time)  No results found.   No diagnosis found.    MDM  Patient with wheezing resolved after two nebs.  HFA given and patient given rx for prednisone.    Patient ambulated in ED with O2 saturations maintained >95, no current signs of respiratory distress. Lung exam improved after hospital treatment. Pt states they are breathing at baseline. Pt has been instructed to continue using prescribed medications and to  speak with PCP about today's exacerbation.   Patient counseled regarding smoking.      Hilario Quarry, MD 08/08/12 [phone]

## 2012-08-08 NOTE — Progress Notes (Signed)
After initial neb tx, Pt hr 99, sats 97% on RA, diminished BBS. RT will continue to monitor

## 2012-09-15 ENCOUNTER — Encounter (HOSPITAL_COMMUNITY): Payer: Self-pay

## 2012-09-15 ENCOUNTER — Emergency Department (HOSPITAL_COMMUNITY)
Admission: EM | Admit: 2012-09-15 | Discharge: 2012-09-15 | Disposition: A | Discharge status: Home / Self Care | Payer: Medicaid Other | Attending: Emergency Medicine | Admitting: Emergency Medicine

## 2012-09-15 DIAGNOSIS — J45909 Unspecified asthma, uncomplicated: Secondary | ICD-10-CM | POA: Insufficient documentation

## 2012-09-15 DIAGNOSIS — M25569 Pain in unspecified knee: Secondary | ICD-10-CM | POA: Insufficient documentation

## 2012-09-15 DIAGNOSIS — Z9889 Other specified postprocedural states: Secondary | ICD-10-CM | POA: Insufficient documentation

## 2012-09-15 DIAGNOSIS — Z862 Personal history of diseases of the blood and blood-forming organs and certain disorders involving the immune mechanism: Secondary | ICD-10-CM | POA: Insufficient documentation

## 2012-09-15 DIAGNOSIS — M25561 Pain in right knee: Secondary | ICD-10-CM

## 2012-09-15 DIAGNOSIS — F17200 Nicotine dependence, unspecified, uncomplicated: Secondary | ICD-10-CM | POA: Insufficient documentation

## 2012-09-15 DIAGNOSIS — M129 Arthropathy, unspecified: Secondary | ICD-10-CM | POA: Insufficient documentation

## 2012-09-15 DIAGNOSIS — M1711 Unilateral primary osteoarthritis, right knee: Secondary | ICD-10-CM

## 2012-09-15 DIAGNOSIS — Z79899 Other long term (current) drug therapy: Secondary | ICD-10-CM | POA: Insufficient documentation

## 2012-09-15 DIAGNOSIS — Z87828 Personal history of other (healed) physical injury and trauma: Secondary | ICD-10-CM | POA: Insufficient documentation

## 2012-09-15 DIAGNOSIS — Z8639 Personal history of other endocrine, nutritional and metabolic disease: Secondary | ICD-10-CM | POA: Insufficient documentation

## 2012-09-15 MED ORDER — HYDROCODONE-ACETAMINOPHEN 5-325 MG PO TABS: 2.0000 | ORAL_TABLET | ORAL | Status: DC | PRN

## 2012-09-15 NOTE — ED Notes (Signed)
Pt had knee sleeve placed, good distal circulation, able to easily insert finger under elastic edge, pt verbalized it felt better and was not too tight.

## 2012-09-15 NOTE — ED Provider Notes (Signed)
History     CSN: [id number]  Arrival date & time 09/15/12  9562   First MD Initiated Contact with Patient 09/15/12 0845      No chief complaint on file.   (Consider location/radiation/quality/duration/timing/severity/associated sxs/prior treatment) HPI  41 year old female with history of lateral plateau fracture in her right knee in 2009 presents complaining of right knee pain. Patient reports she would occasionally experiencing a dull pain to the right knee pain usually lasts for a few days. However, for the past week patient has had the same pain that is unrelieved despite using rest ice compress elevate and NSAIDs. Onset was gradual, persistent, moderate in severity, nonradiating, worsening with ambulation. No recent trauma. No rash or swelling noted. Patient denies right hip or ankle pain. Denies numbness or weakness.  Past Medical History  Diagnosis Date  . Asthma   . Renal disorder     kidney infections    Past Surgical History  Procedure Date  . Appendectomy   . Tubal ligation     Family History  Problem Relation Age of Onset  . Hypertension Mother   . Hypertension Father     History  Substance Use Topics  . Smoking status: Current Every Day Smoker -- 1.5 packs/day    Types: Cigarettes  . Smokeless tobacco: Never Used  . Alcohol Use: Yes     socially    OB History    Grav Para Term Preterm Abortions TAB SAB Ect Mult Living                  Review of Systems  Constitutional: Negative for fever.  Musculoskeletal: Positive for arthralgias. Negative for back pain and joint swelling.  Skin: Negative for rash and wound.  Neurological: Negative for numbness.  All other systems reviewed and are negative.    Allergies  Bactrim  Home Medications   Current Outpatient Rx  Name Route Sig Dispense Refill  . ALBUTEROL SULFATE HFA 108 (90 BASE) MCG/ACT IN AERS Inhalation Inhale 2 puffs into the lungs every 6 (six) hours as needed. For shortness of breath.      . CYCLOBENZAPRINE HCL 5 MG PO TABS Oral Take 5 mg by mouth at bedtime as needed. Muscle spasms    . TYLENOL PM EXTRA STRENGTH PO Oral Take 2 tablets by mouth at bedtime as needed. For sleep/pain.    Marland Kitchen FLUTICASONE PROPIONATE 50 MCG/ACT NA SUSP Nasal Place 2 sprays into the nose daily.    Marland Kitchen PREDNISONE 10 MG PO TABS Oral Take 2 tablets (20 mg total) by mouth daily. 15 tablet 0    There were no vitals taken for this visit.  Physical Exam  Nursing note and vitals reviewed. Constitutional: She appears well-developed and well-nourished. No distress.  HENT:  Head: Atraumatic.  Eyes: Conjunctivae normal are normal.  Neck: Neck supple.  Musculoskeletal: She exhibits no edema.       Right hip: Normal.       Right knee: She exhibits normal range of motion, no swelling, no effusion, no ecchymosis, no deformity, no laceration, no erythema, normal alignment, no LCL laxity, normal patellar mobility, no bony tenderness, normal meniscus and no MCL laxity. tenderness found. Patellar tendon tenderness noted. No medial joint line, no lateral joint line, no MCL and no LCL tenderness noted.       Right ankle: Normal.  Neurological: She is alert.  Skin: Skin is warm. No rash noted.  Psychiatric: She has a normal mood and affect.    ED  Course  Procedures (including critical care time)  Labs Reviewed - No data to display No results found.   No diagnosis found.  1.  R knee pain  MDM  R knee pain x 1 week, suggestive of arthritis.  No trauma to suggest fx or dislocation.  No evidence of infection . No significant swelling.  Pt able to ambulate.   Pt failed RICE therapy.  Will prescribe pain med, knee sleeve, and ortho referral.  She voice understanding and agrees with plan.          Fayrene Helper, PA-C 09/15/12 [phone]

## 2012-09-15 NOTE — ED Notes (Signed)
Motrin taken before arrival

## 2012-09-15 NOTE — ED Notes (Signed)
Pt presents with NAD.  Pt report hx of rt knee injury yet no new injury- c/o of rt knee pain- good cms at present

## 2012-09-16 NOTE — ED Provider Notes (Signed)
Medical screening examination/treatment/procedure(s) were performed by non-physician practitioner and as supervising physician I was immediately available for consultation/collaboration.   Namya Voges M Cheryal Salas, DO 09/16/12 2024 

## 2012-11-02 ENCOUNTER — Emergency Department (HOSPITAL_COMMUNITY)
Admission: EM | Admit: 2012-11-02 | Discharge: 2012-11-02 | Disposition: A | Discharge status: Home / Self Care | Payer: Medicaid Other | Attending: Emergency Medicine | Admitting: Emergency Medicine

## 2012-11-02 ENCOUNTER — Encounter (HOSPITAL_COMMUNITY): Payer: Self-pay

## 2012-11-02 DIAGNOSIS — J45909 Unspecified asthma, uncomplicated: Secondary | ICD-10-CM | POA: Insufficient documentation

## 2012-11-02 DIAGNOSIS — Y929 Unspecified place or not applicable: Secondary | ICD-10-CM | POA: Insufficient documentation

## 2012-11-02 DIAGNOSIS — Y939 Activity, unspecified: Secondary | ICD-10-CM | POA: Insufficient documentation

## 2012-11-02 DIAGNOSIS — X500XXA Overexertion from strenuous movement or load, initial encounter: Secondary | ICD-10-CM | POA: Insufficient documentation

## 2012-11-02 DIAGNOSIS — S335XXA Sprain of ligaments of lumbar spine, initial encounter: Secondary | ICD-10-CM | POA: Insufficient documentation

## 2012-11-02 DIAGNOSIS — Z791 Long term (current) use of non-steroidal anti-inflammatories (NSAID): Secondary | ICD-10-CM | POA: Insufficient documentation

## 2012-11-02 DIAGNOSIS — Z87448 Personal history of other diseases of urinary system: Secondary | ICD-10-CM | POA: Insufficient documentation

## 2012-11-02 DIAGNOSIS — S39012A Strain of muscle, fascia and tendon of lower back, initial encounter: Secondary | ICD-10-CM

## 2012-11-02 DIAGNOSIS — F17200 Nicotine dependence, unspecified, uncomplicated: Secondary | ICD-10-CM | POA: Insufficient documentation

## 2012-11-02 DIAGNOSIS — Z79899 Other long term (current) drug therapy: Secondary | ICD-10-CM | POA: Insufficient documentation

## 2012-11-02 DIAGNOSIS — R05 Cough: Secondary | ICD-10-CM | POA: Insufficient documentation

## 2012-11-02 DIAGNOSIS — R059 Cough, unspecified: Secondary | ICD-10-CM | POA: Insufficient documentation

## 2012-11-02 MED ORDER — CYCLOBENZAPRINE HCL 10 MG PO TABS: 10.0000 mg | ORAL_TABLET | Freq: Three times a day (TID) | ORAL | Status: DC | PRN

## 2012-11-02 MED ORDER — NAPROXEN 500 MG PO TABS
500.0000 mg | ORAL_TABLET | Freq: Once | ORAL | Status: AC
  Administered 2012-11-02: 500 mg via ORAL
  Filled 2012-11-02: qty 1

## 2012-11-02 MED ORDER — ETODOLAC 500 MG PO TABS: 500.0000 mg | ORAL_TABLET | Freq: Two times a day (BID) | ORAL | Status: DC

## 2012-11-02 NOTE — ED Notes (Signed)
Patient reports that she is having low back pain especially the left side. Patient reports that she has taken Tylenol PM for back pain with very little relief. Patient also reports that she has had a non productive cough x 2 weeks.

## 2012-11-02 NOTE — ED Provider Notes (Signed)
History     CSN: [id number]  Arrival date & time 11/02/12  0740   First MD Initiated Contact with Patient 11/02/12 [phone]      Chief Complaint  Patient presents with  . Back Pain  . Cough    Patient is a 41 y.o. female presenting with back pain and cough. The history is provided by the patient.  Back Pain  This is a new problem. The current episode started more than 2 days ago. The problem occurs constantly. The problem has not changed since onset.The pain is associated with no known injury (She has been coughing a lot but is not sure that that is related.). The pain is present in the lumbar spine. The quality of the pain is described as stabbing. The pain does not radiate. The pain is moderate. The symptoms are aggravated by twisting and bending. Pertinent negatives include no chest pain, no fever, no abdominal pain, no dysuria, no leg pain, no tingling and no weakness. She has tried analgesics for the symptoms. The treatment provided no relief.  Cough Pertinent negatives include no chest pain.   she does not feel short of breath. Patient does have a history of cigarette use.  Past Medical History  Diagnosis Date  . Asthma   . Renal disorder     kidney infections    Past Surgical History  Procedure Date  . Appendectomy   . Tubal ligation     Family History  Problem Relation Age of Onset  . Hypertension Mother   . Hypertension Father     History  Substance Use Topics  . Smoking status: Current Every Day Smoker -- 1.5 packs/day    Types: Cigarettes  . Smokeless tobacco: Never Used  . Alcohol Use: Yes     Comment: socially    OB History    Grav Para Term Preterm Abortions TAB SAB Ect Mult Living                  Review of Systems  Constitutional: Negative for fever.  Respiratory: Positive for cough.   Cardiovascular: Negative for chest pain.  Gastrointestinal: Negative for abdominal pain.  Genitourinary: Negative for dysuria.  Musculoskeletal: Positive for  back pain.  Neurological: Negative for tingling and weakness.  All other systems reviewed and are negative.    Allergies  Bactrim  Home Medications   Current Outpatient Rx  Name  Route  Sig  Dispense  Refill  . ALBUTEROL SULFATE HFA 108 (90 BASE) MCG/ACT IN AERS   Inhalation   Inhale 2 puffs into the lungs every 6 (six) hours as needed. For shortness of breath.         . CYCLOBENZAPRINE HCL 10 MG PO TABS   Oral   Take 1 tablet (10 mg total) by mouth 3 (three) times daily as needed for muscle spasms (Take 1 tablet every 8 - 12 hours as needed for muscle spasms.).   20 tablet   0   . TYLENOL PM EXTRA STRENGTH PO   Oral   Take 2 tablets by mouth at bedtime as needed. For sleep/pain.         Marland Kitchen ETODOLAC 500 MG PO TABS   Oral   Take 1 tablet (500 mg total) by mouth 2 (two) times daily.   30 tablet   0   . FLUTICASONE PROPIONATE 50 MCG/ACT NA SUSP   Nasal   Place 2 sprays into the nose daily.         Marland Kitchen  HYDROCODONE-ACETAMINOPHEN 5-325 MG PO TABS   Oral   Take 2 tablets by mouth every 4 (four) hours as needed for pain.   10 tablet   0   . PREDNISONE 10 MG PO TABS   Oral   Take 2 tablets (20 mg total) by mouth daily.   15 tablet   0     BP 128/82  Pulse 97  Temp 98.4 F (36.9 C) (Oral)  Resp 18  SpO2 96%  Physical Exam  Nursing note and vitals reviewed. Constitutional: She appears well-developed and well-nourished.  HENT:  Head: Normocephalic and atraumatic.  Right Ear: External ear normal.  Left Ear: External ear normal.  Nose: Nose normal.  Eyes: Conjunctivae normal and EOM are normal.  Neck: Neck supple. No tracheal deviation present.  Cardiovascular: Normal rate and regular rhythm.   Pulmonary/Chest: Effort normal. No stridor. No respiratory distress. She has no wheezes. She has no rales. She exhibits no tenderness.  Musculoskeletal: She exhibits no edema and no tenderness.       Lumbar back: She exhibits decreased range of motion, tenderness,  pain and spasm. She exhibits no swelling and no edema.  Neurological: She is alert. She is not disoriented. No cranial nerve deficit or sensory deficit. She exhibits normal muscle tone. Coordination normal.  Reflex Scores:      Patellar reflexes are 2+ on the right side and 2+ on the left side.      Achilles reflexes are 2+ on the right side and 2+ on the left side. Skin: Skin is warm and dry. No rash noted. She is not diaphoretic. No erythema.  Psychiatric: She has a normal mood and affect. Her behavior is normal. Thought content normal.    ED Course  Procedures (including critical care time)  Labs Reviewed - No data to display No results found.   1. Strain of lumbar region       MDM  No sign of acute neurological or vascular emergency associated with pt's back pain.  Suspect musculoskeletal strain. The patient does not have any fevers. There is no wheezing on exam. Her exam is reassuring. I doubt that she has pneumonia      Celene Kras, MD 11/02/12 [phone]

## 2013-01-21 ENCOUNTER — Encounter (HOSPITAL_COMMUNITY): Payer: Self-pay | Admitting: Emergency Medicine

## 2013-01-21 ENCOUNTER — Emergency Department (HOSPITAL_COMMUNITY)
Admission: EM | Admit: 2013-01-21 | Discharge: 2013-01-21 | Disposition: A | Discharge status: Home / Self Care | Payer: Medicaid Other | Attending: Emergency Medicine | Admitting: Emergency Medicine

## 2013-01-21 DIAGNOSIS — F17200 Nicotine dependence, unspecified, uncomplicated: Secondary | ICD-10-CM | POA: Insufficient documentation

## 2013-01-21 DIAGNOSIS — Y9389 Activity, other specified: Secondary | ICD-10-CM | POA: Insufficient documentation

## 2013-01-21 DIAGNOSIS — M549 Dorsalgia, unspecified: Secondary | ICD-10-CM

## 2013-01-21 DIAGNOSIS — Y92009 Unspecified place in unspecified non-institutional (private) residence as the place of occurrence of the external cause: Secondary | ICD-10-CM | POA: Insufficient documentation

## 2013-01-21 DIAGNOSIS — X500XXA Overexertion from strenuous movement or load, initial encounter: Secondary | ICD-10-CM | POA: Insufficient documentation

## 2013-01-21 DIAGNOSIS — Z87448 Personal history of other diseases of urinary system: Secondary | ICD-10-CM | POA: Insufficient documentation

## 2013-01-21 DIAGNOSIS — IMO0002 Reserved for concepts with insufficient information to code with codable children: Secondary | ICD-10-CM | POA: Insufficient documentation

## 2013-01-21 DIAGNOSIS — J45909 Unspecified asthma, uncomplicated: Secondary | ICD-10-CM | POA: Insufficient documentation

## 2013-01-21 MED ORDER — HYDROCODONE-ACETAMINOPHEN 5-325 MG PO TABS: 1.0000 | ORAL_TABLET | Freq: Four times a day (QID) | ORAL | Status: DC | PRN

## 2013-01-21 MED ORDER — OXYCODONE-ACETAMINOPHEN 5-325 MG PO TABS
2.0000 | ORAL_TABLET | Freq: Once | ORAL | Status: AC
  Administered 2013-01-21: 2 via ORAL
  Filled 2013-01-21: qty 2

## 2013-01-21 MED ORDER — METHOCARBAMOL 500 MG PO TABS: 500.0000 mg | ORAL_TABLET | Freq: Two times a day (BID) | ORAL | Status: DC

## 2013-01-21 MED ORDER — NAPROXEN 500 MG PO TABS: 500.0000 mg | ORAL_TABLET | Freq: Two times a day (BID) | ORAL | Status: DC

## 2013-01-21 MED ORDER — KETOROLAC TROMETHAMINE 60 MG/2ML IM SOLN
60.0000 mg | Freq: Once | INTRAMUSCULAR | Status: AC
  Administered 2013-01-21: 60 mg via INTRAMUSCULAR
  Filled 2013-01-21: qty 2

## 2013-01-21 NOTE — ED Provider Notes (Signed)
History     CSN: [id number]  Arrival date & time 01/21/13  1101   First MD Initiated Contact with Patient 01/21/13 1105      No chief complaint on file.   (Consider location/radiation/quality/duration/timing/severity/associated sxs/prior treatment) HPI Comments: This is a 42 year old female, no pertinent past medical history, who presents emergency department with chief complaint of back pain. Patient states that she was smoking in bed, and dropped the cigarette, this caused her to jump out of the bed, and posterior back. This happened 3 days ago. States that she has had worsening back pain sense. Patient states that her back pain is 9/10. It does not radiate. She does not have any bowel or bladder incontinence, or saddle anesthesia. Patient is able to ambulate appropriately. Patient is tried taking OTC pain medicine, with no relief.  The history is provided by the patient. No language interpreter was used.    Past Medical History  Diagnosis Date  . Asthma   . Renal disorder     kidney infections    Past Surgical History  Procedure Laterality Date  . Appendectomy    . Tubal ligation      Family History  Problem Relation Age of Onset  . Hypertension Mother   . Hypertension Father     History  Substance Use Topics  . Smoking status: Current Every Day Smoker -- 1.50 packs/day    Types: Cigarettes  . Smokeless tobacco: Never Used  . Alcohol Use: Yes     Comment: socially    OB History   Grav Para Term Preterm Abortions TAB SAB Ect Mult Living                  Review of Systems  All other systems reviewed and are negative.    Allergies  Bactrim  Home Medications   Current Outpatient Rx  Name  Route  Sig  Dispense  Refill  . albuterol (PROVENTIL HFA;VENTOLIN HFA) 108 (90 BASE) MCG/ACT inhaler   Inhalation   Inhale 2 puffs into the lungs every 6 (six) hours as needed for wheezing.         . Diphenhydramine-APAP, sleep, (TYLENOL PM EXTRA STRENGTH PO)    Oral   Take 2 tablets by mouth at bedtime as needed. For sleep/pain.         Marland Kitchen HYDROcodone-acetaminophen (NORCO/VICODIN) 5-325 MG per tablet   Oral   Take 1 tablet by mouth every 6 (six) hours as needed for pain.   10 tablet   0   . methocarbamol (ROBAXIN) 500 MG tablet   Oral   Take 1 tablet (500 mg total) by mouth 2 (two) times daily.   20 tablet   0   . naproxen (NAPROSYN) 500 MG tablet   Oral   Take 1 tablet (500 mg total) by mouth 2 (two) times daily with a meal.   30 tablet   0     BP 129/93  Pulse 118  Temp(Src) 97.7 F (36.5 C) (Oral)  SpO2 96%  LMP 01/07/2013  Physical Exam  Nursing note and vitals reviewed. Constitutional: She is oriented to person, place, and time. She appears well-developed and well-nourished.  HENT:  Head: Normocephalic and atraumatic.  Eyes: Conjunctivae and EOM are normal. Pupils are equal, round, and reactive to light.  Neck: Normal range of motion. Neck supple.  Cardiovascular: Regular rhythm.  Exam reveals no gallop and no friction rub.   No murmur heard. Tachycardic, the patient is in pain and agitated  because of the pain  Pulmonary/Chest: Effort normal and breath sounds normal. No respiratory distress. She has no wheezes. She has no rales. She exhibits no tenderness.  Abdominal: Soft. Bowel sounds are normal. She exhibits no distension and no mass. There is no tenderness. There is no rebound and no guarding.  Musculoskeletal: Normal range of motion. She exhibits tenderness. She exhibits no edema.  Thoracic paraspinal muscles tender to palpation, no bony tenderness of the spine  Neurological: She is alert and oriented to person, place, and time.  Skin: Skin is warm and dry.  Psychiatric: She has a normal mood and affect. Her behavior is normal. Judgment and thought content normal.    ED Course  Procedures (including critical care time)  Labs Reviewed - No data to display No results found.   1. Back pain       MDM   Patient with back pain.  No neurological deficits and normal neuro exam.  Patient can walk but states is painful.  No loss of bowel or bladder control.  No concern for cauda equina.  No fever, night sweats, weight loss, h/o cancer, IVDU.  RICE protocol and pain medicine indicated and discussed with patient.   Patient's pain improved to 5/10 in the emergency department.        Roxy Horseman, PA-C 01/21/13 1533

## 2013-01-21 NOTE — ED Notes (Signed)
C/O mid back pain x 3 days. Thinks she may have twisted it when she dropped her cigarette.

## 2013-01-21 NOTE — ED Notes (Signed)
Patient claims she thinks she injured back when she dropped a cigarette in the bed and jumped up real quick to find it.   Patient complains of lower back pain and middle back pain.

## 2013-01-22 NOTE — ED Provider Notes (Signed)
Medical screening examination/treatment/procedure(s) were performed by non-physician practitioner and as supervising physician I was immediately available for consultation/collaboration.    Lafe Clerk D Elison Worrel, MD 01/22/13 0726 

## 2013-04-26 ENCOUNTER — Emergency Department (HOSPITAL_COMMUNITY)
Admission: EM | Admit: 2013-04-26 | Discharge: 2013-04-26 | Disposition: A | Discharge status: Home / Self Care | Payer: Medicaid Other | Attending: Emergency Medicine | Admitting: Emergency Medicine

## 2013-04-26 ENCOUNTER — Encounter (HOSPITAL_COMMUNITY): Payer: Self-pay | Admitting: Emergency Medicine

## 2013-04-26 ENCOUNTER — Emergency Department (HOSPITAL_COMMUNITY): Payer: Medicaid Other

## 2013-04-26 DIAGNOSIS — R0789 Other chest pain: Secondary | ICD-10-CM | POA: Insufficient documentation

## 2013-04-26 DIAGNOSIS — F17200 Nicotine dependence, unspecified, uncomplicated: Secondary | ICD-10-CM | POA: Insufficient documentation

## 2013-04-26 DIAGNOSIS — Z79899 Other long term (current) drug therapy: Secondary | ICD-10-CM | POA: Insufficient documentation

## 2013-04-26 DIAGNOSIS — R059 Cough, unspecified: Secondary | ICD-10-CM | POA: Insufficient documentation

## 2013-04-26 DIAGNOSIS — Z87448 Personal history of other diseases of urinary system: Secondary | ICD-10-CM | POA: Insufficient documentation

## 2013-04-26 DIAGNOSIS — J4531 Mild persistent asthma with (acute) exacerbation: Secondary | ICD-10-CM

## 2013-04-26 DIAGNOSIS — J45901 Unspecified asthma with (acute) exacerbation: Secondary | ICD-10-CM | POA: Insufficient documentation

## 2013-04-26 DIAGNOSIS — R05 Cough: Secondary | ICD-10-CM | POA: Insufficient documentation

## 2013-04-26 MED ORDER — ALBUTEROL SULFATE (5 MG/ML) 0.5% IN NEBU
5.0000 mg | INHALATION_SOLUTION | Freq: Once | RESPIRATORY_TRACT | Status: AC
  Administered 2013-04-26: 5 mg via RESPIRATORY_TRACT
  Filled 2013-04-26: qty 1

## 2013-04-26 MED ORDER — PREDNISONE 20 MG PO TABS: 40.0000 mg | ORAL_TABLET | Freq: Every day | ORAL | Status: DC

## 2013-04-26 MED ORDER — PREDNISONE 20 MG PO TABS
60.0000 mg | ORAL_TABLET | Freq: Once | ORAL | Status: AC
Start: 2013-04-26 — End: 2013-04-26
  Administered 2013-04-26: 60 mg via ORAL
  Filled 2013-04-26: qty 3

## 2013-04-26 NOTE — ED Notes (Signed)
Pt presenting to ed with c/o asthma exacerbation onset x 1 day. Pt denies chest pain at this time pt states positive wheezing but her inhaler is not working

## 2013-04-26 NOTE — ED Provider Notes (Signed)
History     CSN: [id number]  Arrival date & time 04/26/13  4098   First MD Initiated Contact with Patient 04/26/13 [phone]      Chief Complaint  Patient presents with  . Asthma    (Consider location/radiation/quality/duration/timing/severity/associated sxs/prior treatment) HPI  Patient is a 42 year old female past medical history significant for asthma, current smoker presented to the emergency department for worsening asthma exacerbation that started 2 days ago. Patient states over the last 2 days she has noticed audible wheezing and nonproductive coughing but has been unable to alleviate symptoms with her at home rescue inhaler. Patient states she is using inhaler 2-3 times a day since exacerbation. She is also endorsing some shortness of breath after coughing spell. Patient denies fever, chills, chest pain, leg swelling. PERC negative.    Past Medical History  Diagnosis Date  . Asthma   . Renal disorder     kidney infections    Past Surgical History  Procedure Laterality Date  . Appendectomy    . Tubal ligation      Family History  Problem Relation Age of Onset  . Hypertension Mother   . Hypertension Father     History  Substance Use Topics  . Smoking status: Current Every Day Smoker -- 1.50 packs/day    Types: Cigarettes  . Smokeless tobacco: Never Used  . Alcohol Use: Yes     Comment: socially    OB History   Grav Para Term Preterm Abortions TAB SAB Ect Mult Living                  Review of Systems  Constitutional: Negative for fever and chills.  HENT: Negative.   Eyes: Negative.   Respiratory: Positive for cough, chest tightness, shortness of breath and wheezing.   Cardiovascular: Negative for chest pain, palpitations and leg swelling.  Gastrointestinal: Negative.   Genitourinary: Negative.   Musculoskeletal: Negative.   Skin: Negative.   Neurological: Negative.     Allergies  Bactrim  Home Medications   Current Outpatient Rx  Name  Route   Sig  Dispense  Refill  . albuterol (PROVENTIL HFA;VENTOLIN HFA) 108 (90 BASE) MCG/ACT inhaler   Inhalation   Inhale 2 puffs into the lungs every 6 (six) hours as needed for wheezing.         . Diphenhydramine-APAP, sleep, (TYLENOL PM EXTRA STRENGTH PO)   Oral   Take 2 tablets by mouth at bedtime as needed. For sleep/pain.         . predniSONE (DELTASONE) 20 MG tablet   Oral   Take 2 tablets (40 mg total) by mouth daily.   10 tablet   0     BP 120/91  Pulse 112  Temp(Src) 98 F (36.7 C) (Oral)  Resp 20  SpO2 95%  LMP 04/05/2013  Physical Exam  Constitutional: She is oriented to person, place, and time. She appears well-developed and well-nourished. No distress.  HENT:  Head: Normocephalic and atraumatic.  Mouth/Throat: Oropharynx is clear and moist.  Eyes: Conjunctivae are normal.  Neck: Neck supple.  Cardiovascular: Normal rate, regular rhythm and normal heart sounds.   Pulmonary/Chest: Effort normal. No accessory muscle usage. Not tachypneic. No respiratory distress. She has wheezes. She exhibits no tenderness.  Abdominal: Soft.  Neurological: She is alert and oriented to person, place, and time.  Skin: Skin is warm and dry. She is not diaphoretic.  Psychiatric: She has a normal mood and affect.    ED Course  Procedures (  including critical care time)  Medications  albuterol (PROVENTIL) (5 MG/ML) 0.5% nebulizer solution 5 mg (5 mg Nebulization Given 04/26/13 0844)  predniSONE (DELTASONE) tablet 60 mg (60 mg Oral Given 04/26/13 1018)   Lung fields CTA after nebulizer treatment.   Labs Reviewed - No data to display Dg Chest 2 View  04/26/2013   *RADIOLOGY REPORT*  Clinical Data: Shortness of breath  CHEST - 2 VIEW  Comparison: January 24, 2012.  Findings: Cardiomediastinal silhouette appears normal.  No acute pulmonary disease is noted.  Mild reticular densities are again noted throughout both lungs which are unchanged compared to prior exam consistent with chronic  interstitial lung disease. Bony thorax is intact.  IMPRESSION: Stable chronic interstitial lung disease compared to prior exam. No acute abnormality seen.   Original Report Authenticated By: Lupita Raider.,  M.D.     1. Asthma exacerbation, mild persistent       MDM  Patient ambulated in ED with O2 saturations maintained >90, no current signs of respiratory distress. Lung exam improved after nebulizer treatment. CXR w/o acute finding. Prednisone given in the ED and pt will bd dc with 5 day burst. Pt states they are breathing at baseline. Pt has been instructed to continue using prescribed medications and to speak with PCP about today's exacerbation. Patient is stable at time of discharge.           Jeannetta Ellis, PA-C 04/26/13 1403

## 2013-04-27 NOTE — ED Provider Notes (Signed)
  Medical screening examination/treatment/procedure(s) were performed by non-physician practitioner and as supervising physician I was immediately available for consultation/collaboration.    Gerhard Munch, MD 04/27/13 [phone]

## 2013-05-26 ENCOUNTER — Encounter (HOSPITAL_COMMUNITY): Payer: Self-pay | Admitting: Emergency Medicine

## 2013-05-26 ENCOUNTER — Emergency Department (HOSPITAL_COMMUNITY)
Admission: EM | Admit: 2013-05-26 | Discharge: 2013-05-26 | Disposition: A | Discharge status: Home / Self Care | Payer: Medicaid Other | Attending: Emergency Medicine | Admitting: Emergency Medicine

## 2013-05-26 DIAGNOSIS — Z87448 Personal history of other diseases of urinary system: Secondary | ICD-10-CM | POA: Insufficient documentation

## 2013-05-26 DIAGNOSIS — R52 Pain, unspecified: Secondary | ICD-10-CM | POA: Insufficient documentation

## 2013-05-26 DIAGNOSIS — M25551 Pain in right hip: Secondary | ICD-10-CM

## 2013-05-26 DIAGNOSIS — J45909 Unspecified asthma, uncomplicated: Secondary | ICD-10-CM | POA: Insufficient documentation

## 2013-05-26 DIAGNOSIS — M25559 Pain in unspecified hip: Secondary | ICD-10-CM | POA: Insufficient documentation

## 2013-05-26 DIAGNOSIS — F17200 Nicotine dependence, unspecified, uncomplicated: Secondary | ICD-10-CM | POA: Insufficient documentation

## 2013-05-26 MED ORDER — HYDROCODONE-ACETAMINOPHEN 5-325 MG PO TABS: 1.0000 | ORAL_TABLET | ORAL | Status: DC | PRN

## 2013-05-26 MED ORDER — HYDROCODONE-ACETAMINOPHEN 5-325 MG PO TABS
1.0000 | ORAL_TABLET | Freq: Once | ORAL | Status: AC
  Administered 2013-05-26: 1 via ORAL
  Filled 2013-05-26: qty 1

## 2013-05-26 NOTE — ED Provider Notes (Signed)
History    This chart was scribed for a non-physician practitioner working with Hurman Horn, MD by Jiles Prows, ED scribe. This patient was seen in room WTR7/WTR7 and the patient's care was started at 7:10 PM.  CSN: [id number] Arrival date & time 05/26/13  1856  Chief Complaint  Patient presents with  . Hip Pain  The history is provided by the patient and medical records. No language interpreter was used.   HPI Comments: [name] is a 42 y.o. female who presents to the Emergency Department complaining of moderate to severe, constant, aching, dull pain to right hip that radiates down to right knee.  Hip is not tender, it just "constantly aches."  She reports that she cannot seem to find a comfortable position.  She reports that she cannot sit down due to the pain.  No recent injury or trauma. She claims tylenol PM has offered no relief.  She states that pillows, walking, and swimming have offered no relief.  No numbness or paresthesias of RLE.  Normal gait.  Pt not established with orthopedics.  She reports several months ago she was told she had a bulging disc.  This current pain is described as different and localized to her hip. Pt reports she smokes 1.5 packs a day.  Past Medical History  Diagnosis Date  . Asthma   . Renal disorder     kidney infections   Past Surgical History  Procedure Laterality Date  . Appendectomy    . Tubal ligation     Family History  Problem Relation Age of Onset  . Hypertension Mother   . Hypertension Father    History  Substance Use Topics  . Smoking status: Current Every Day Smoker -- 1.50 packs/day    Types: Cigarettes  . Smokeless tobacco: Never Used  . Alcohol Use: Yes     Comment: socially   OB History   Grav Para Term Preterm Abortions TAB SAB Ect Mult Living                 Review of Systems  Musculoskeletal: Positive for arthralgias.  All other systems reviewed and are negative.    Allergies  Bactrim  Home Medications    Current Outpatient Rx  Name  Route  Sig  Dispense  Refill  . albuterol (PROVENTIL HFA;VENTOLIN HFA) 108 (90 BASE) MCG/ACT inhaler   Inhalation   Inhale 2 puffs into the lungs every 6 (six) hours as needed for wheezing.         . Diphenhydramine-APAP, sleep, (TYLENOL PM EXTRA STRENGTH PO)   Oral   Take 2 tablets by mouth at bedtime as needed. For sleep/pain.         . predniSONE (DELTASONE) 20 MG tablet   Oral   Take 2 tablets (40 mg total) by mouth daily.   10 tablet   0    BP 120/66  Pulse 123  Temp(Src) 98.3 F (36.8 C) (Oral)  Resp 18  SpO2 100%  LMP 05/19/2013  Physical Exam  Nursing note and vitals reviewed. Constitutional: She is oriented to person, place, and time. She appears well-developed and well-nourished.  Pt is tearful, appears uncomfortable  HENT:  Head: Normocephalic and atraumatic.  Mouth/Throat: Oropharynx is clear and moist.  Eyes: Conjunctivae and EOM are normal. Pupils are equal, round, and reactive to light.  Neck: Normal range of motion.  Cardiovascular: Normal rate, regular rhythm and normal heart sounds.   Pulmonary/Chest: Effort normal and breath sounds normal.  Abdominal: Soft. Bowel sounds are normal.  Musculoskeletal: Normal range of motion.       Right hip: She exhibits normal range of motion, normal strength, no tenderness, no bony tenderness, no swelling, no crepitus, no deformity and no laceration.  Right hip without TTP, full ROM maintained, strong distal pulse, sensation intact, normal gait  Neurological: She is alert and oriented to person, place, and time.  Skin: Skin is warm and dry.  Psychiatric: She has a normal mood and affect.    ED Course  Procedures (including critical care time) DIAGNOSTIC STUDIES: Oxygen Saturation is 100% on RA, normal by my interpretation.    COORDINATION OF CARE: 7:17 PM - Discussed ED treatment with pt at bedside including pain management and orthopedic follow up and pt agrees.   Labs  Reviewed - No data to display No results found.  1. Hip pain, acute, right     MDM   Right hip pain without recent injury or trauma.  Doubt acute fx or dislocation- imaging deferred.  No numbness or paresthesias of RLE, NVI.  Vicodin given in the ED, rx for the same.  FU with ortho if sx not improving in the next week.  Discussed plan with pt, she agreed.  Return precautions advised.  HR elevated during triage- likely due to pt moving around room and crying.  Improved to 109 prior to d/c.  I personally performed the services described in this documentation, which was scribed in my presence. The recorded information has been reviewed and is accurate.    Garlon Hatchet, PA-C 05/26/13 2016

## 2013-05-26 NOTE — ED Notes (Signed)
Per pt, has chronic back pain-now radiating to right lower extremity

## 2013-05-27 NOTE — ED Provider Notes (Signed)
Medical screening examination/treatment/procedure(s) were performed by non-physician practitioner and as supervising physician I was immediately available for consultation/collaboration.  Ramzy Cappelletti M Cana Mignano, MD 05/27/13 1256 

## 2013-08-18 ENCOUNTER — Ambulatory Visit: Payer: Medicaid Other | Attending: Physical Medicine and Rehabilitation | Admitting: Physical Therapy

## 2013-09-01 ENCOUNTER — Ambulatory Visit: Payer: Medicaid Other | Attending: Physical Medicine and Rehabilitation | Admitting: Physical Therapy

## 2013-11-07 ENCOUNTER — Encounter (HOSPITAL_COMMUNITY): Payer: Self-pay | Admitting: Emergency Medicine

## 2013-11-07 ENCOUNTER — Emergency Department (HOSPITAL_COMMUNITY): Payer: Medicaid Other

## 2013-11-07 ENCOUNTER — Emergency Department (HOSPITAL_COMMUNITY)
Admission: EM | Admit: 2013-11-07 | Discharge: 2013-11-07 | Disposition: A | Discharge status: Home / Self Care | Payer: Medicaid Other | Attending: Emergency Medicine | Admitting: Emergency Medicine

## 2013-11-07 DIAGNOSIS — Z87440 Personal history of urinary (tract) infections: Secondary | ICD-10-CM | POA: Insufficient documentation

## 2013-11-07 DIAGNOSIS — F17200 Nicotine dependence, unspecified, uncomplicated: Secondary | ICD-10-CM | POA: Insufficient documentation

## 2013-11-07 DIAGNOSIS — J45901 Unspecified asthma with (acute) exacerbation: Secondary | ICD-10-CM | POA: Insufficient documentation

## 2013-11-07 DIAGNOSIS — Z79899 Other long term (current) drug therapy: Secondary | ICD-10-CM | POA: Insufficient documentation

## 2013-11-07 DIAGNOSIS — R000 Tachycardia, unspecified: Secondary | ICD-10-CM | POA: Insufficient documentation

## 2013-11-07 DIAGNOSIS — J45909 Unspecified asthma, uncomplicated: Secondary | ICD-10-CM

## 2013-11-07 DIAGNOSIS — R0602 Shortness of breath: Secondary | ICD-10-CM

## 2013-11-07 LAB — CBC
Hemoglobin: 14.8 g/dL (ref 12.0–15.0)
RBC: 4.07 MIL/uL (ref 3.87–5.11)
RDW: 13 % (ref 11.5–15.5)

## 2013-11-07 LAB — BASIC METABOLIC PANEL
GFR calc Af Amer: >90 mL/min (ref >90)
GFR calc non Af Amer: >90 mL/min (ref >90)
Glucose, Bld: 91 mg/dL (ref 70–99)
Potassium: 4 mEq/L (ref 3.5–5.1)
Sodium: 133 mEq/L — ABNORMAL LOW (ref 135–145)

## 2013-11-07 LAB — D-DIMER, QUANTITATIVE: D-Dimer, Quant: <0.27 ug/mL-FEU (ref 0.00–0.48)

## 2013-11-07 MED ORDER — PREDNISONE 20 MG PO TABS: 40.0000 mg | ORAL_TABLET | Freq: Every day | ORAL | Status: DC

## 2013-11-07 MED ORDER — ALBUTEROL SULFATE (5 MG/ML) 0.5% IN NEBU
5.0000 mg | INHALATION_SOLUTION | Freq: Once | RESPIRATORY_TRACT | Status: AC
  Administered 2013-11-07: 5 mg via RESPIRATORY_TRACT
  Filled 2013-11-07: qty 1

## 2013-11-07 MED ORDER — PREDNISONE 20 MG PO TABS
60.0000 mg | ORAL_TABLET | Freq: Once | ORAL | Status: AC
  Administered 2013-11-07: 60 mg via ORAL
  Filled 2013-11-07: qty 3

## 2013-11-07 MED ORDER — IPRATROPIUM BROMIDE 0.02 % IN SOLN
0.5000 mg | Freq: Once | RESPIRATORY_TRACT | Status: AC
  Administered 2013-11-07: 0.5 mg via RESPIRATORY_TRACT
  Filled 2013-11-07: qty 2.5

## 2013-11-07 MED ORDER — ALBUTEROL SULFATE HFA 108 (90 BASE) MCG/ACT IN AERS
2.0000 | INHALATION_SPRAY | RESPIRATORY_TRACT | Status: DC | PRN
  Administered 2013-11-07: 2 via RESPIRATORY_TRACT
  Filled 2013-11-07: qty 6.7

## 2013-11-07 NOTE — ED Provider Notes (Signed)
CSN: [id number]     Arrival date & time 11/07/13  1730 History   First MD Initiated Contact with Patient 11/07/13 2020     Chief Complaint  Patient presents with  . Shortness of Breath   (Consider location/radiation/quality/duration/timing/severity/associated sxs/prior Treatment) HPI Comments: Patient with history of asthma presents with several days of acutely worsening shortness of breath. Patient states that this feels different than her usual asthma attacks. She states that she gets very winded when she walks in the hallway, talks on the phone, rolls over in bed. She has not been using her albuterol inhaler today. She denies fever, hemoptysis, chest pain, cough. She is a smoker. She has no history of blood clots, recent immobilizations, recent surgery, history of estrogen use. No lower extremity swelling or pain. She denies bleeding. No other treatments prior to arrival. Onset of symptoms gradual. Course is gradually worsening. Nothing makes symptoms better.   Patient is a 42 y.o. female presenting with shortness of breath. The history is provided by the patient.  Shortness of Breath Associated symptoms: no abdominal pain, no chest pain, no cough, no fever, no headaches, no rash, no sore throat, no vomiting and no wheezing     Past Medical History  Diagnosis Date  . Asthma   . Renal disorder     kidney infections   Past Surgical History  Procedure Laterality Date  . Appendectomy    . Tubal ligation     Family History  Problem Relation Age of Onset  . Hypertension Mother   . Hypertension Father    History  Substance Use Topics  . Smoking status: Current Every Day Smoker -- 1.50 packs/day    Types: Cigarettes  . Smokeless tobacco: Never Used  . Alcohol Use: Yes     Comment: socially   OB History   Grav Para Term Preterm Abortions TAB SAB Ect Mult Living                 Review of Systems  Constitutional: Negative for fever.  HENT: Negative for rhinorrhea and sore  throat.   Eyes: Negative for redness.  Respiratory: Positive for shortness of breath. Negative for cough and wheezing.   Cardiovascular: Negative for chest pain, palpitations and leg swelling.  Gastrointestinal: Negative for nausea, vomiting, abdominal pain and diarrhea.  Genitourinary: Negative for dysuria.  Musculoskeletal: Negative for myalgias.  Skin: Negative for rash.  Neurological: Negative for headaches.    Allergies  Bactrim  Home Medications   Current Outpatient Rx  Name  Route  Sig  Dispense  Refill  . albuterol (PROVENTIL HFA;VENTOLIN HFA) 108 (90 BASE) MCG/ACT inhaler   Inhalation   Inhale 2 puffs into the lungs every 6 (six) hours as needed for wheezing.         . Diphenhydramine-APAP, sleep, (TYLENOL PM EXTRA STRENGTH PO)   Oral   Take 2 tablets by mouth at bedtime as needed. For sleep/pain.          BP 109/81  Pulse 135  Temp(Src) 98.7 F (37.1 C) (Oral)  Resp 22  SpO2 96%  LMP 10/13/2013 Physical Exam  Nursing note and vitals reviewed. Constitutional: She appears well-developed and well-nourished.  HENT:  Head: Normocephalic and atraumatic.  Mouth/Throat: Oropharynx is clear and moist.  Eyes: Conjunctivae are normal. Right eye exhibits no discharge. Left eye exhibits no discharge.  Neck: Normal range of motion. Neck supple.  Cardiovascular: Regular rhythm and normal heart sounds.  Tachycardia present.   Rate 110 at rest  Pulmonary/Chest: Effort normal and breath sounds normal. No respiratory distress. She has no wheezes. She has no rales. She exhibits no tenderness.  Abdominal: Soft. There is no tenderness. There is no rebound and no guarding.  Musculoskeletal: She exhibits no edema and no tenderness.  No LE edema  Neurological: She is alert.  Skin: Skin is warm and dry.  Psychiatric: She has a normal mood and affect.    ED Course  Procedures (including critical care time) Labs Review Labs Reviewed  BASIC METABOLIC PANEL - Abnormal;  Notable for the following:    Sodium 133 (*)    CO2 15 (*)    All other components within normal limits  CBC - Abnormal; Notable for the following:    WBC 12.6 (*)    MCV 107.4 (*)    MCH 36.4 (*)    All other components within normal limits  D-DIMER, QUANTITATIVE   Imaging Review Dg Chest 2 View (if Patient Has Fever And/or Copd)  11/07/2013   CLINICAL DATA:  Shortness of breath, history asthma  EXAM: CHEST  2 VIEW  COMPARISON:  04/26/2013  FINDINGS: Normal heart size, mediastinal contours, and pulmonary vascularity.  Peribronchial thickening views increased since previous exam.  Minimal bibasilar atelectasis.  No acute infiltrate, pleural effusion or pneumothorax.  No acute osseous findings.  IMPRESSION: Peribronchial thickening which could reflect bronchitis or asthma.  Minimal bibasilar atelectasis.   Electronically Signed   By: Ulyses Southward M.D.   On: 11/07/2013 20:36    EKG Interpretation   None      8:56 PM Patient seen and examined. Work-up initiated. Medications ordered.   Vital signs reviewed and are as follows: Filed Vitals:   11/07/13 1750  BP: 109/81  Pulse: 135  Temp: 98.7 F (37.1 C)  Resp: 22   Pt informed of results, including negative d-dimer. She states she is feeling much improved after just breathing treatment. Lung exam remains unchanged without wheezing. Feel patient can be treated as asthma exacerbation/bronchitis with albuterol, steroids.   Patient provided with inhaler prior to discharge.   Patient urged to return with worsening symptoms or other concerns. Patient verbalized understanding and agrees with plan.   BP 132/76  Pulse 101  Temp(Src) 97.6 F (36.4 C) (Oral)  Resp 20  SpO2 95%  LMP 10/13/2013   MDM   1. Shortness of breath   2. Asthma    Concern for PE given tachycardia and patient's profound SOB with activity. However d-dimer normal (pt low risk per Wells criteria) and she is greatly improved from a symptomatic standpoint in ED  with treatment. Will treat as asthma/bronchitis flare. She appears well, comfortable. No concern for ACS without chest pain and other risk factors. Do not suspect emergent or dangerous medical condition precluding discharge at this time.     Renne Crigler, PA-C 11/09/13 2216

## 2013-11-07 NOTE — ED Notes (Signed)
Pt c/o dyspnea when talks or moves in bed. Pt has PMH asthma.

## 2013-11-10 NOTE — ED Provider Notes (Signed)
Medical screening examination/treatment/procedure(s) were performed by non-physician practitioner and as supervising physician I was immediately available for consultation/collaboration.  EKG Interpretation    Date/Time:  Sunday November 07 2013 17:56:17 EST Ventricular Rate:  122 PR Interval:  121 QRS Duration: 91 QT Interval:  377 QTC Calculation: 537 R Axis:   77 Text Interpretation:  Sinus tachycardia Nonspecific T abnormalities, diffuse leads Prolonged QT interval Baseline wander in lead(s) V3 ED PHYSICIAN INTERPRETATION AVAILABLE IN [facility] Confirmed by TEST, RECORD ([id number]) on 11/09/2013 7:43:07 AM              Candyce Churn, MD 11/10/13 1504

## 2013-12-30 ENCOUNTER — Encounter (HOSPITAL_COMMUNITY): Payer: Self-pay | Admitting: Emergency Medicine

## 2013-12-30 ENCOUNTER — Emergency Department (HOSPITAL_COMMUNITY)
Admission: EM | Admit: 2013-12-30 | Discharge: 2013-12-30 | Disposition: A | Discharge status: Home / Self Care | Payer: Medicaid Other | Attending: Emergency Medicine | Admitting: Emergency Medicine

## 2013-12-30 DIAGNOSIS — N390 Urinary tract infection, site not specified: Secondary | ICD-10-CM | POA: Insufficient documentation

## 2013-12-30 DIAGNOSIS — J45909 Unspecified asthma, uncomplicated: Secondary | ICD-10-CM | POA: Insufficient documentation

## 2013-12-30 DIAGNOSIS — M545 Low back pain, unspecified: Secondary | ICD-10-CM | POA: Insufficient documentation

## 2013-12-30 DIAGNOSIS — Z3202 Encounter for pregnancy test, result negative: Secondary | ICD-10-CM | POA: Insufficient documentation

## 2013-12-30 DIAGNOSIS — Z79899 Other long term (current) drug therapy: Secondary | ICD-10-CM | POA: Insufficient documentation

## 2013-12-30 DIAGNOSIS — R52 Pain, unspecified: Secondary | ICD-10-CM | POA: Insufficient documentation

## 2013-12-30 DIAGNOSIS — F17200 Nicotine dependence, unspecified, uncomplicated: Secondary | ICD-10-CM | POA: Insufficient documentation

## 2013-12-30 LAB — URINE MICROSCOPIC-ADD ON

## 2013-12-30 LAB — URINALYSIS, ROUTINE W REFLEX MICROSCOPIC
BILIRUBIN URINE: NEGATIVE
GLUCOSE, UA: NEGATIVE mg/dL
KETONES UR: NEGATIVE mg/dL
Leukocytes, UA: NEGATIVE
Nitrite: POSITIVE — AB
Protein, ur: NEGATIVE mg/dL
Specific Gravity, Urine: 1.014 (ref 1.005–1.030)
UROBILINOGEN UA: 0.2 mg/dL (ref 0.0–1.0)
pH: 6.5 (ref 5.0–8.0)

## 2013-12-30 LAB — POCT PREGNANCY, URINE: PREG TEST UR: NEGATIVE

## 2013-12-30 MED ORDER — CYCLOBENZAPRINE HCL 10 MG PO TABS: 10.0000 mg | ORAL_TABLET | Freq: Two times a day (BID) | ORAL | Status: DC | PRN

## 2013-12-30 MED ORDER — CYCLOBENZAPRINE HCL 10 MG PO TABS
10.0000 mg | ORAL_TABLET | Freq: Once | ORAL | Status: AC
  Administered 2013-12-30: 10 mg via ORAL
  Filled 2013-12-30: qty 1

## 2013-12-30 MED ORDER — HYDROCODONE-ACETAMINOPHEN 5-325 MG PO TABS
2.0000 | ORAL_TABLET | Freq: Once | ORAL | Status: AC
  Administered 2013-12-30: 2 via ORAL
  Filled 2013-12-30: qty 2

## 2013-12-30 MED ORDER — HYDROCODONE-ACETAMINOPHEN 5-325 MG PO TABS: 1.0000 | ORAL_TABLET | ORAL | Status: DC | PRN

## 2013-12-30 MED ORDER — CEPHALEXIN 500 MG PO CAPS: 500.0000 mg | ORAL_CAPSULE | Freq: Two times a day (BID) | ORAL | Status: DC

## 2013-12-30 NOTE — ED Provider Notes (Signed)
CSN: [id number]     Arrival date & time 12/30/13  0845 History   First MD Initiated Contact with Patient 12/30/13 [phone]     Chief Complaint  Patient presents with  . Back Pain   (Consider location/radiation/quality/duration/timing/severity/associated sxs/prior Treatment) HPI Comments: 43 year old female presents with left lower back pain that started yesterday. She states she was trying to change sheets on her bed and she felt like her back locked up. She states that she has chronic midline back pain and is from a herniated disc but this pain is different. She states it is an 8/10 and when she moves is more like a 9/10. She states it's a pressure-like pain like someone is pushing her back. The pain is constant. No nausea or vomiting. No fevers. No urinary symptoms. The pain does not move anywhere and she has no weakness or numbness. She is most comfortable standing up or leaning to the right. Any movement seems to worsen her pain. No abdominal pain. She took Tylenol yesterday which eased the pain but did not relieve it. No bowel or bladder incontinence.   Past Medical History  Diagnosis Date  . Asthma   . Renal disorder     kidney infections   Past Surgical History  Procedure Laterality Date  . Appendectomy    . Tubal ligation     Family History  Problem Relation Age of Onset  . Hypertension Mother   . Hypertension Father    History  Substance Use Topics  . Smoking status: Current Every Day Smoker -- 1.50 packs/day    Types: Cigarettes  . Smokeless tobacco: Never Used  . Alcohol Use: Yes     Comment: socially   OB History   Grav Para Term Preterm Abortions TAB SAB Ect Mult Living                 Review of Systems  Constitutional: Negative for fever.  Gastrointestinal: Negative for nausea, vomiting and abdominal pain.  Genitourinary: Negative for dysuria and hematuria.  Musculoskeletal: Positive for back pain.  Neurological: Negative for weakness and numbness.  All other  systems reviewed and are negative.    Allergies  Bactrim  Home Medications   Current Outpatient Rx  Name  Route  Sig  Dispense  Refill  . acetaminophen (TYLENOL) 500 MG tablet   Oral   Take 500 mg by mouth every 6 (six) hours as needed for mild pain or headache.         . albuterol (PROVENTIL HFA;VENTOLIN HFA) 108 (90 BASE) MCG/ACT inhaler   Inhalation   Inhale 2 puffs into the lungs every 6 (six) hours as needed for wheezing.          BP 126/78  Pulse 98  Temp(Src) 97 F (36.1 C) (Oral)  Resp 16  SpO2 100%  LMP 12/30/2013 Physical Exam  Nursing note and vitals reviewed. Constitutional: She is oriented to person, place, and time. She appears well-developed and well-nourished.  HENT:  Head: Normocephalic and atraumatic.  Right Ear: External ear normal.  Left Ear: External ear normal.  Nose: Nose normal.  Eyes: Right eye exhibits no discharge. Left eye exhibits no discharge.  Cardiovascular: Normal rate, regular rhythm and normal heart sounds.   Pulmonary/Chest: Effort normal and breath sounds normal.  Abdominal: Soft. She exhibits no distension. There is no tenderness. There is no CVA tenderness.  Musculoskeletal:       Lumbar back: She exhibits tenderness. She exhibits no bony tenderness and no spasm.  Back:  Neurological: She is alert and oriented to person, place, and time. She has normal strength. No sensory deficit.  Reflex Scores:      Patellar reflexes are 2+ on the right side and 2+ on the left side.      Achilles reflexes are 2+ on the right side and 2+ on the left side. 5/5 strength in bilateral lower extremities  Skin: Skin is warm and dry.    ED Course  Procedures (including critical care time) Labs Review Labs Reviewed  URINALYSIS, ROUTINE W REFLEX MICROSCOPIC - Abnormal; Notable for the following:    APPearance CLOUDY (*)    Hgb urine dipstick LARGE (*)    Nitrite POSITIVE (*)    All other components within normal limits  URINE  MICROSCOPIC-ADD ON - Abnormal; Notable for the following:    Squamous Epithelial / LPF FEW (*)    Bacteria, UA MANY (*)    All other components within normal limits  URINE CULTURE  POCT PREGNANCY, URINE   Imaging Review No results found.  EKG Interpretation   None       MDM   1. Acute low back pain   2. UTI (lower urinary tract infection)    No midline tenderness or red flag symptoms to suggest needing Xray or MRI. I have low suspicion for epidural abscess, fracture, mass or hematoma. Given the location of pain her urine was checked. Will give oral pain meds, muscle relaxers and recommend NSAIDs and exercises. Her pain significantly improved in ED, states she feels "less stiff". Discussed urine with patient, she denies urinary sx and states she is currently on her period. She states she's "prone to kidney infections". Given this, with nitrites I will treat for presumptive UTI and send for culture. Her back pain is most c/w MSK strain or possible spasm. Discussed strict return precautions.     Ephraim Hamburger, MD 12/30/13 1136

## 2013-12-30 NOTE — ED Notes (Signed)
Per pt, chronic back pain-pain since yesterday

## 2013-12-30 NOTE — Discharge Instructions (Signed)
SEEK IMMEDIATE MEDICAL ATTENTION IF: New numbness, tingling, weakness, or problem with the use of your arms or legs.  Severe back pain not relieved with medications.  Change in bowel or bladder control.  Increasing pain in any areas of the body (such as chest or abdominal pain).  Shortness of breath, dizziness or fainting.  Nausea (feeling sick to your stomach), vomiting, fever, or sweats.     Back Exercises Back exercises help treat and prevent back injuries. The goal of back exercises is to increase the strength of your abdominal and back muscles and the flexibility of your back. These exercises should be started when you no longer have back pain. Back exercises include:  Pelvic Tilt. Lie on your back with your knees bent. Tilt your pelvis until the lower part of your back is against the floor. Hold this position 5 to 10 sec and repeat 5 to 10 times.  Knee to Chest. Pull first 1 knee up against your chest and hold for 20 to 30 seconds, repeat this with the other knee, and then both knees. This may be done with the other leg straight or bent, whichever feels better.  Sit-Ups or Curl-Ups. Bend your knees 90 degrees. Start with tilting your pelvis, and do a partial, slow sit-up, lifting your trunk only 30 to 45 degrees off the floor. Take at least 2 to 3 seconds for each sit-up. Do not do sit-ups with your knees out straight. If partial sit-ups are difficult, simply do the above but with only tightening your abdominal muscles and holding it as directed.  Hip-Lift. Lie on your back with your knees flexed 90 degrees. Push down with your feet and shoulders as you raise your hips a couple inches off the floor; hold for 10 seconds, repeat 5 to 10 times.  Back arches. Lie on your stomach, propping yourself up on bent elbows. Slowly press on your hands, causing an arch in your low back. Repeat 3 to 5 times. Any initial stiffness and discomfort should lessen with repetition over time.  Shoulder-Lifts.  Lie face down with arms beside your body. Keep hips and torso pressed to floor as you slowly lift your head and shoulders off the floor. Do not overdo your exercises, especially in the beginning. Exercises may cause you some mild back discomfort which lasts for a few minutes; however, if the pain is more severe, or lasts for more than 15 minutes, do not continue exercises until you see your caregiver. Improvement with exercise therapy for back problems is slow.  See your caregivers for assistance with developing a proper back exercise program. Document Released: 12/19/2004 Document Revised: 02/03/2012 Document Reviewed: 09/12/2011 [facility] Patient Information 2014 [place].

## 2014-01-02 LAB — URINE CULTURE

## 2014-01-03 ENCOUNTER — Telehealth (HOSPITAL_BASED_OUTPATIENT_CLINIC_OR_DEPARTMENT_OTHER): Payer: Self-pay

## 2014-01-03 NOTE — Telephone Encounter (Signed)
Post ED Visit - Positive Culture Follow-up  Culture report reviewed by antimicrobial stewardship pharmacist: []  Wes Dulaney, Pharm.D., BCPS []  Heide Guile, Pharm.D., BCPS []  Alycia Rossetti, Pharm.D., BCPS [x]  [place], [place].D., BCPS, AAHIVP []  Legrand Como, Pharm.D., BCPS, AAHIVP  Positive urine culture Treated with cefazolin, organism sensitive to the same and no further patient follow-up is required at this time.  Ileene Musa 01/03/2014, 10:38 AM

## 2014-01-24 ENCOUNTER — Encounter (HOSPITAL_COMMUNITY): Payer: Self-pay | Admitting: Emergency Medicine

## 2014-01-24 ENCOUNTER — Emergency Department (HOSPITAL_COMMUNITY)
Admission: EM | Admit: 2014-01-24 | Discharge: 2014-01-24 | Disposition: A | Discharge status: Home / Self Care | Payer: Medicaid Other | Attending: Emergency Medicine | Admitting: Emergency Medicine

## 2014-01-24 ENCOUNTER — Emergency Department (HOSPITAL_COMMUNITY): Payer: Medicaid Other

## 2014-01-24 DIAGNOSIS — Y9389 Activity, other specified: Secondary | ICD-10-CM | POA: Insufficient documentation

## 2014-01-24 DIAGNOSIS — W010XXA Fall on same level from slipping, tripping and stumbling without subsequent striking against object, initial encounter: Secondary | ICD-10-CM | POA: Insufficient documentation

## 2014-01-24 DIAGNOSIS — Y929 Unspecified place or not applicable: Secondary | ICD-10-CM | POA: Insufficient documentation

## 2014-01-24 DIAGNOSIS — M545 Low back pain, unspecified: Secondary | ICD-10-CM

## 2014-01-24 DIAGNOSIS — F17200 Nicotine dependence, unspecified, uncomplicated: Secondary | ICD-10-CM | POA: Insufficient documentation

## 2014-01-24 DIAGNOSIS — Z79899 Other long term (current) drug therapy: Secondary | ICD-10-CM | POA: Insufficient documentation

## 2014-01-24 DIAGNOSIS — IMO0002 Reserved for concepts with insufficient information to code with codable children: Secondary | ICD-10-CM | POA: Insufficient documentation

## 2014-01-24 DIAGNOSIS — Z8739 Personal history of other diseases of the musculoskeletal system and connective tissue: Secondary | ICD-10-CM | POA: Insufficient documentation

## 2014-01-24 DIAGNOSIS — Z87440 Personal history of urinary (tract) infections: Secondary | ICD-10-CM | POA: Insufficient documentation

## 2014-01-24 DIAGNOSIS — J45909 Unspecified asthma, uncomplicated: Secondary | ICD-10-CM | POA: Insufficient documentation

## 2014-01-24 MED ORDER — CYCLOBENZAPRINE HCL 10 MG PO TABS: 10.0000 mg | ORAL_TABLET | Freq: Two times a day (BID) | ORAL | Status: DC | PRN

## 2014-01-24 MED ORDER — HYDROCODONE-ACETAMINOPHEN 5-325 MG PO TABS: 1.0000 | ORAL_TABLET | ORAL | Status: DC | PRN

## 2014-01-24 MED ORDER — CYCLOBENZAPRINE HCL 10 MG PO TABS
5.0000 mg | ORAL_TABLET | Freq: Once | ORAL | Status: AC
  Administered 2014-01-24: 5 mg via ORAL
  Filled 2014-01-24: qty 1

## 2014-01-24 MED ORDER — HYDROCODONE-ACETAMINOPHEN 5-325 MG PO TABS
2.0000 | ORAL_TABLET | Freq: Once | ORAL | Status: AC
  Administered 2014-01-24: 2 via ORAL
  Filled 2014-01-24: qty 2

## 2014-01-24 NOTE — ED Notes (Signed)
Pt has a ride home.  

## 2014-01-24 NOTE — ED Provider Notes (Signed)
CSN: [id number]     Arrival date & time 01/24/14  1745 History   First MD Initiated Contact with Patient 01/24/14 1807     This chart was scribed for non-physician practitioner, Delos Haring PA-C working with Mariea Clonts, MD by Forrestine Him, ED Scribe. This patient was seen in room [facility] and the patient's care was started at 7:19 PM.   Chief Complaint  Patient presents with  . Back Pain   The history is provided by the patient. No language interpreter was used.    HPI Comments: [name] is a 43 y.o. female with a PMHx of bulging disc who presents to the Emergency Department complaining of non radiating, constant, severe lower back pain described as dull and sharp that initially started 2 days ago after slipping and falling. She states she believes she twisted her back upon impact. She plans to follow up with her orthopedic provider, but is unable to be seen until later on this week. She states she has tried Tylenol PM and heat with mild improvement. At this time she denies any bowel or urinary changes. Her PMHx includes asthma. Pt has no other complaints or concerns this visit.   Past Medical History  Diagnosis Date  . Asthma   . Renal disorder     kidney infections   Past Surgical History  Procedure Laterality Date  . Appendectomy    . Tubal ligation     Family History  Problem Relation Age of Onset  . Hypertension Mother   . Hypertension Father    History  Substance Use Topics  . Smoking status: Current Every Day Smoker -- 1.50 packs/day    Types: Cigarettes  . Smokeless tobacco: Never Used  . Alcohol Use: Yes     Comment: socially   OB History   Grav Para Term Preterm Abortions TAB SAB Ect Mult Living                 Review of Systems  Constitutional: Negative for fever and chills.  HENT: Negative for congestion.   Eyes: Negative for redness.  Respiratory: Negative for cough.   Genitourinary: Negative for urgency and difficulty urinating.   Musculoskeletal: Positive for back pain.  Skin: Negative for rash.  Psychiatric/Behavioral: Negative for confusion.      Allergies  Bactrim  Home Medications   Current Outpatient Rx  Name  Route  Sig  Dispense  Refill  . acetaminophen (TYLENOL) 500 MG tablet   Oral   Take 500 mg by mouth every 6 (six) hours as needed for mild pain or headache.         . albuterol (PROVENTIL HFA;VENTOLIN HFA) 108 (90 BASE) MCG/ACT inhaler   Inhalation   Inhale 2 puffs into the lungs every 6 (six) hours as needed for wheezing.         . cyclobenzaprine (FLEXERIL) 10 MG tablet   Oral   Take 1 tablet (10 mg total) by mouth 2 (two) times daily as needed for muscle spasms.   20 tablet   0   . HYDROcodone-acetaminophen (NORCO/VICODIN) 5-325 MG per tablet   Oral   Take 1-2 tablets by mouth every 4 (four) hours as needed.   20 tablet   0    Triage Vitals: BP 127/97  Pulse 121  Temp(Src) 98.6 F (37 C) (Oral)  Resp 17  SpO2 93%  LMP 12/30/2013   Physical Exam  Nursing note and vitals reviewed. Constitutional: She is oriented to person, place,  and time. She appears well-developed and well-nourished.  HENT:  Head: Normocephalic and atraumatic.  Eyes: EOM are normal.  Neck: Normal range of motion.  Cardiovascular: Normal rate.   Pulmonary/Chest: Effort normal.  Musculoskeletal: Normal range of motion.       Back:   Equal strength to bilateral lower extremities. Neurosensory function adequate to both legs. Skin color is normal. Skin is warm and moist. I see no step off deformity, no bony tenderness. Pt is able to ambulate without limp. Pain is relieved when sitting in certain positions. ROM is decreased due to pain. No crepitus, laceration, effusion, swelling.  Pulses are normal   Neurological: She is alert and oriented to person, place, and time.  Skin: Skin is warm and dry.  Psychiatric: She has a normal mood and affect. Her behavior is normal.    ED Course  Procedures  (including critical care time)  DIAGNOSTIC STUDIES: Oxygen Saturation is 93% on RA, adequate by my interpretation.    COORDINATION OF CARE: 7:20 PM- Will order X-Ray. Discussed treatment plan with pt at bedside and pt agreed to plan.     Labs Review Labs Reviewed - No data to display Imaging Review Dg Lumbar Spine Complete  01/24/2014   CLINICAL DATA:  Slipped on ice.  Low back pain.  EXAM: LUMBAR SPINE - COMPLETE 4+ VIEW  COMPARISON:  None.  FINDINGS: There is no evidence of lumbar spine fracture. Alignment is normal. Intervertebral disc spaces are maintained.  IMPRESSION: Negative.   Electronically Signed   By: Misty Stanley M.D.   On: 01/24/2014 19:10     EKG Interpretation None      MDM   Final diagnoses:  Low back pain    42 y.o.[name]'s  with back pain. No neurological deficits and normal neuro exam. Patient can walk but states is painful. No loss of bowel or bladder control. No concern for cauda equina. No fever, night sweats, weight loss, h/o cancer, IVDU. RICE protocol and pain medicine indicated and discussed with patient.   Patient Plan 1. Medications: narcotic pain medication, muscle relaxer and usual home medications  2. Treatment: rest, drink plenty of fluids, gentle stretching as discussed, alternate ice and heat  3. Follow Up: Please followup with your primary doctor for discussion of your diagnoses and further evaluation after today's visit; if you do not have a primary care doctor use the resource guide provided to find one   Vital signs are stable at discharge. Filed Vitals:   01/24/14 1804  BP: 127/97  Pulse: 121  Temp: 98.6 F (37 C)  Resp: 17    Patient/guardian has voiced understanding and agreed to follow-up with the PCP or specialist.   I personally performed the services described in this documentation, which was scribed in my presence. The recorded information has been reviewed and is accurate.   Linus Mako, PA-C 01/24/14  1933

## 2014-01-24 NOTE — Discharge Instructions (Signed)
Back Pain, Adult °Low back pain is very common. About 1 in 5 people have back pain. The cause of low back pain is rarely dangerous. The pain often gets better over time. About half of people with a sudden onset of back pain feel better in just 2 weeks. About 8 in 10 people feel better by 6 weeks.  °CAUSES °Some common causes of back pain include: °· Strain of the muscles or ligaments supporting the spine. °· Wear and tear (degeneration) of the spinal discs. °· Arthritis. °· Direct injury to the back. °DIAGNOSIS °Most of the time, the direct cause of low back pain is not known. However, back pain can be treated effectively even when the exact cause of the pain is unknown. Answering your caregiver's questions about your overall health and symptoms is one of the most accurate ways to make sure the cause of your pain is not dangerous. If your caregiver needs more information, he or she may order lab work or imaging tests (X-rays or MRIs). However, even if imaging tests show changes in your back, this usually does not require surgery. °HOME CARE INSTRUCTIONS °For many people, back pain returns. Since low back pain is rarely dangerous, it is often a condition that people can learn to manage on their own.  °· Remain active. It is stressful on the back to sit or stand in one place. Do not sit, drive, or stand in one place for more than 30 minutes at a time. Take short walks on level surfaces as soon as pain allows. Try to increase the length of time you walk each day. °· Do not stay in bed. Resting more than 1 or 2 days can delay your recovery. °· Do not avoid exercise or work. Your body is made to move. It is not dangerous to be active, even though your back may hurt. Your back will likely heal faster if you return to being active before your pain is gone. °· Pay attention to your body when you  bend and lift. Many people have less discomfort when lifting if they bend their knees, keep the load close to their bodies, and  avoid twisting. Often, the most comfortable positions are those that put less stress on your recovering back. °· Find a comfortable position to sleep. Use a firm mattress and lie on your side with your knees slightly bent. If you lie on your back, put a pillow under your knees. °· Only take over-the-counter or prescription medicines as directed by your caregiver. Over-the-counter medicines to reduce pain and inflammation are often the most helpful. Your caregiver may prescribe muscle relaxant drugs. These medicines help dull your pain so you can more quickly return to your normal activities and healthy exercise. °· Put ice on the injured area. °· Put ice in a plastic bag. °· Place a towel between your skin and the bag. °· Leave the ice on for 15-20 minutes, 03-04 times a day for the first 2 to 3 days. After that, ice and heat may be alternated to reduce pain and spasms. °· Ask your caregiver about trying back exercises and gentle massage. This may be of some benefit. °· Avoid feeling anxious or stressed. Stress increases muscle tension and can worsen back pain. It is important to recognize when you are anxious or stressed and learn ways to manage it. Exercise is a great option. °SEEK MEDICAL CARE IF: °· You have pain that is not relieved with rest or medicine. °· You have pain that does not improve in 1 week. °· You have new symptoms. °· You are generally not feeling well. °SEEK   IMMEDIATE MEDICAL CARE IF:   You have pain that radiates from your back into your legs.  You develop new bowel or bladder control problems.  You have unusual weakness or numbness in your arms or legs.  You develop nausea or vomiting.  You develop abdominal pain.  You feel faint. Document Released: 11/11/2005 Document Revised: 05/12/2012 Document Reviewed: 04/01/2011 [facility] Patient Information 2014 [place], [place].  Back Injury Prevention The following tips can help you to prevent a back injury. PHYSICAL  FITNESS  Exercise often. Try to develop strong stomach (abdominal) muscles.  Do aerobic exercises often. This includes walking, jogging, biking, swimming.  Do exercises that help with balance and strength often. This includes tai chi and yoga.  Stretch before and after you exercise.  Keep a healthy weight. DIET   Ask your doctor how much calcium and vitamin D you need every day.  Include calcium in your diet. Foods high in calcium include dairy products; green, leafy vegetables; and products with calcium added (fortified).  Include vitamin D in your diet. Foods high in vitamin D include milk and products with vitamin D added.  Think about taking a multivitamin or other nutritional products called " supplements."  Stop smoking if you smoke. POSTURE   Sit and stand up straight. Avoid leaning forward or hunching over.  Choose chairs that support your lower back.  If you work at a desk:  Sit close to your work so you do not lean over.  Keep your chin tucked in.  Keep your neck drawn back.  Keep your elbows bent at a right angle. Your arms should look like the letter "L."  Sit high and close to the steering wheel when you drive. Add low back support to your car seat if needed.  Avoid sitting or standing in one position for too long. Get up and move around every hour. Take breaks if you are driving for a long time.  Sleep on your side with your knees slightly bent. You can also sleep on your back with a pillow under your knees. Do not sleep on your stomach. LIFTING, TWISTING, AND REACHING  Avoid heavy lifting, especially lifting over and over again. If you must do heavy lifting:  Stretch before lifting.  Work slowly.  Rest between lifts.  Use carts and dollies to move objects when possible.  Make several small trips instead of carrying 1 heavy load.  Ask for help when you need it.  Ask for help when moving big, awkward objects.  Follow these steps when  lifting:  Stand with your feet shoulder-width apart.  Get as close to the object as you can. Do not pick up heavy objects that are far from your body.  Use handles or lifting straps when possible.  Bend at your knees. Squat down, but keep your heels off the floor.  Keep your shoulders back, your chin tucked in, and your back straight.  Lift the object slowly. Tighten the muscles in your legs, stomach, and butt. Keep the object as close to the center of your body as possible.  Reverse these directions when you put a load down.  Do not:  Lift the object above your waist.  Twist at the waist while lifting or carrying a load. Move your feet if you need to turn, not your waist.  Bend over without bending at your knees.  Avoid reaching over your head, across a table, or for an object on a high surface. OTHER TIPS  Avoid wet  floors and keep sidewalks clear of ice.  Do not sleep on a mattress that is too soft or too hard.  Keep items that you use often within easy reach.  Put heavier objects on shelves at waist level. Put lighter objects on lower or higher shelves.  Find ways to lessen your stress. You can try exercise, massage, or relaxation.  Get help for depression or anxiety if needed. GET HELP IF:  You injure your back.  You have questions about diet, exercise, or other ways to prevent back injuries. MAKE SURE YOU:  Understand these instructions.  Will watch your condition.  Will get help right away if you are not doing well or get worse. Document Released: 04/29/2008 Document Revised: 02/03/2012 Document Reviewed: 12/23/2011 [facility] Patient Information 2014 [place].

## 2014-01-24 NOTE — ED Notes (Signed)
Pt slipped and fell on Saturday. Pt c/o lower back pain. Pt states she has hx of bulging disc. Pt ambulatory to exam room with steady gait.

## 2014-01-25 NOTE — ED Provider Notes (Signed)
Medical screening examination/treatment/procedure(s) were performed by non-physician practitioner and as supervising physician I was immediately available for consultation/collaboration.   EKG Interpretation None        Mariea Clonts, MD 01/25/14 [phone]

## 2014-02-23 ENCOUNTER — Encounter (HOSPITAL_COMMUNITY): Payer: Self-pay | Admitting: Emergency Medicine

## 2014-02-23 ENCOUNTER — Emergency Department (HOSPITAL_COMMUNITY)
Admission: EM | Admit: 2014-02-23 | Discharge: 2014-02-23 | Disposition: A | Discharge status: Home / Self Care | Payer: Medicaid Other | Attending: Emergency Medicine | Admitting: Emergency Medicine

## 2014-02-23 DIAGNOSIS — J45909 Unspecified asthma, uncomplicated: Secondary | ICD-10-CM | POA: Insufficient documentation

## 2014-02-23 DIAGNOSIS — M549 Dorsalgia, unspecified: Secondary | ICD-10-CM

## 2014-02-23 DIAGNOSIS — Z79899 Other long term (current) drug therapy: Secondary | ICD-10-CM | POA: Insufficient documentation

## 2014-02-23 DIAGNOSIS — M545 Low back pain, unspecified: Secondary | ICD-10-CM | POA: Insufficient documentation

## 2014-02-23 DIAGNOSIS — G8929 Other chronic pain: Secondary | ICD-10-CM | POA: Insufficient documentation

## 2014-02-23 DIAGNOSIS — Z87448 Personal history of other diseases of urinary system: Secondary | ICD-10-CM | POA: Insufficient documentation

## 2014-02-23 DIAGNOSIS — F17200 Nicotine dependence, unspecified, uncomplicated: Secondary | ICD-10-CM | POA: Insufficient documentation

## 2014-02-23 MED ORDER — HYDROCODONE-ACETAMINOPHEN 5-325 MG PO TABS
2.0000 | ORAL_TABLET | Freq: Once | ORAL | Status: AC
  Administered 2014-02-23: 2 via ORAL
  Filled 2014-02-23: qty 2

## 2014-02-23 NOTE — ED Provider Notes (Signed)
CSN: [id number]     Arrival date & time 02/23/14  1631 History  This chart was scribed for non-physician practitioner, Montine Circle, PA-C working with Orpah Greek, MD by Frederich Balding, ED scribe. This patient was seen in room WTR7/WTR7 and the patient's care was started at 5:46 PM.   Chief Complaint  Patient presents with  . Back Pain   The history is provided by the patient. No language interpreter was used.   HPI Comments: [name] is a 43 y.o. female with history of bulging disc who presents to the Emergency Department complaining of chronic lower back pain. Pt went to a PCP's office this morning and states she was turned away because of her insurance and told to come here. Standing up straight relieves some pain but certain movements worsen it. Denies fever, bowel or bladder incontinence. Pt has been evaluated 2 other times in the last 3 months for the same. She states she has seen an orthopedic doctor in the past and done epidural shots. Denies history of cancer or IV drug use.   Past Medical History  Diagnosis Date  . Asthma   . Renal disorder     kidney infections   Past Surgical History  Procedure Laterality Date  . Appendectomy    . Tubal ligation     Family History  Problem Relation Age of Onset  . Hypertension Mother   . Hypertension Father    History  Substance Use Topics  . Smoking status: Current Every Day Smoker -- 1.50 packs/day    Types: Cigarettes  . Smokeless tobacco: Never Used  . Alcohol Use: Yes     Comment: socially   OB History   Grav Para Term Preterm Abortions TAB SAB Ect Mult Living                 Review of Systems  Constitutional: Negative for fever.  HENT: Negative for congestion.   Eyes: Negative for redness.  Respiratory: Negative for shortness of breath.   Cardiovascular: Negative for chest pain.  Gastrointestinal: Negative for abdominal distention.  Genitourinary:       Negative for bowel or bladder incontinence.   Musculoskeletal: Positive for back pain.  Skin: Negative for rash.  Neurological: Negative for speech difficulty.  Psychiatric/Behavioral: Negative for confusion.   Allergies  Bactrim  Home Medications   Current Outpatient Rx  Name  Route  Sig  Dispense  Refill  . acetaminophen (TYLENOL) 500 MG tablet   Oral   Take 500 mg by mouth every 6 (six) hours as needed for mild pain or headache.         . albuterol (PROVENTIL HFA;VENTOLIN HFA) 108 (90 BASE) MCG/ACT inhaler   Inhalation   Inhale 2 puffs into the lungs every 6 (six) hours as needed for wheezing.         . cyclobenzaprine (FLEXERIL) 10 MG tablet   Oral   Take 1 tablet (10 mg total) by mouth 2 (two) times daily as needed for muscle spasms.   20 tablet   0   . HYDROcodone-acetaminophen (NORCO/VICODIN) 5-325 MG per tablet   Oral   Take 1-2 tablets by mouth every 4 (four) hours as needed.   20 tablet   0    BP 121/90  Pulse 125  Temp(Src) 98.6 F (37 C) (Oral)  SpO2 94%  LMP 02/20/2014  Physical Exam  Nursing note and vitals reviewed. Constitutional: She is oriented to person, place, and time. She appears well-developed  and well-nourished. No distress.  HENT:  Head: Normocephalic and atraumatic.  Eyes: Conjunctivae and EOM are normal. Right eye exhibits no discharge. Left eye exhibits no discharge. No scleral icterus.  Neck: Normal range of motion. Neck supple. No tracheal deviation present.  Cardiovascular: Normal rate, regular rhythm and normal heart sounds.  Exam reveals no gallop and no friction rub.   No murmur heard. Pulmonary/Chest: Effort normal and breath sounds normal. No stridor. No respiratory distress. She has no wheezes.  Abdominal: Soft. She exhibits no distension. There is no tenderness.  Musculoskeletal: Normal range of motion. She exhibits no edema.  Lumbar paraspinal muscles tender to palpation, no bony tenderness, step-offs, or gross abnormality or deformity of spine, patient is able to  ambulate, moves all extremities  Bilateral great toe extension intact Bilateral plantar/dorsiflexion intact  Neurological: She is alert and oriented to person, place, and time. No cranial nerve deficit.  Sensation and strength intact bilaterally   Skin: Skin is warm and dry. She is not diaphoretic.  Psychiatric: She has a normal mood and affect. Her behavior is normal. Judgment and thought content normal.    ED Course  Procedures (including critical care time)  DIAGNOSTIC STUDIES: Oxygen Saturation is 94% on RA, adequate by my interpretation.    COORDINATION OF CARE: 5:49 PM-Discussed treatment plan which includes pain medication in the ED with pt at bedside and pt agreed to plan. Advised pt that she can not be discharged home with any pain medications. Will give her neurosurgery follow up.   Labs Review Labs Reviewed - No data to display Imaging Review No results found.   EKG Interpretation None      MDM   Final diagnoses:  Back pain    Patient with back pain.  No neurological deficits and normal neuro exam.  Patient is ambulatory.  No loss of bowel or bladder control.  Doubt cauda equina.  Denies fever,  doubt epidural abscess or other lesion. Recommend back exercises, stretching, RICE.   I personally performed the services described in this documentation, which was scribed in my presence. The recorded information has been reviewed and is accurate.    Montine Circle, PA-C 02/23/14 1756

## 2014-02-23 NOTE — ED Provider Notes (Signed)
Medical screening examination/treatment/procedure(s) were performed by non-physician practitioner and as supervising physician I was immediately available for consultation/collaboration.   EKG Interpretation None        Orpah Greek, MD 02/23/14 1800

## 2014-02-23 NOTE — Discharge Instructions (Signed)
Chronic Pain Discharge Instructions  Emergency care providers appreciate that many patients coming to [place] are in severe pain and we wish to address their pain in the safest, most responsible manner.  It is important to recognize however, that the proper treatment of chronic pain differs from that of the pain of injuries and acute illnesses.  Our goal is to provide quality, safe, personalized care and we thank you for giving [place] the opportunity to serve you. The use of narcotics and related agents for chronic pain syndromes may lead to additional physical and psychological problems.  Nearly as many people die from prescription narcotics each year as die from car crashes.  Additionally, this risk is increased if such prescriptions are obtained from a variety of sources.  Therefore, only your primary care physician or a pain management specialist is able to safely treat such syndromes with narcotic medications long-term.    Documentation revealing such prescriptions have been sought from multiple sources may prohibit [place] from providing a refill or different narcotic medication.  Your name may be checked first through the [facility].  This database is a record of controlled substance medication prescriptions that the patient has received.  This has been established by [facility] in an effort to eliminate the dangerous, and often life threatening, practice of obtaining multiple prescriptions from different medical providers.   If you have a chronic pain syndrome (i.e. chronic headaches, recurrent back or neck pain, dental pain, abdominal or pelvis pain without a specific diagnosis, or neuropathic pain such as fibromyalgia) or recurrent visits for the same condition without an acute diagnosis, you may be treated with non-narcotics and other non-addictive medicines.  Allergic reactions or negative side effects that may be reported by a patient to such medications will not  typically lead to the use of a narcotic analgesic or other controlled substance as an alternative.   Patients managing chronic pain with a personal physician should have provisions in place for breakthrough pain.  If you are in crisis, you should call your physician.  If your physician directs you to the emergency department, please have the doctor call and speak to our attending physician concerning your care.   When patients come to the Emergency Department (ED) with acute medical conditions in which the Emergency Department physician feels appropriate to prescribe narcotic or sedating pain medication, the physician will prescribe these in very limited quantities.  The amount of these medications will last only until you can see your primary care physician in his/her office.  Any patient who returns to the ED seeking refills should expect only non-narcotic pain medications.   In the event of an acute medical condition exists and the emergency physician feels it is necessary that the patient be given a narcotic or sedating medication -  a responsible adult driver should be present in the room prior to the medication being given by the nurse.   Prescriptions for narcotic or sedating medications that have been lost, stolen or expired will not be refilled in the Emergency Department.    Patients who have chronic pain may receive non-narcotic prescriptions until seen by their primary care physician.  It is every patients personal responsibility to maintain active prescriptions with his or her primary care physician or specialist.

## 2014-02-23 NOTE — ED Notes (Signed)
Pt has chronic back pain.  Pt went to MD office today for schedule appt.  Pt was turned away d/t having to have clearance by insurance?  Pt was told to come here.

## 2014-02-23 NOTE — ED Notes (Signed)
Initial Contact - pt c/o 9/10 back pain, chronic in nature.  Reports seen by PCP today, but turned away because of insurance issue and told to come to ER.  Pt ambulatory with steady gait.  Denies n/t to extremities.  Denies bowel/bladder changes/complains.  Skin PWD.  Speaking full/clear sentences.  NAD.

## 2014-04-20 ENCOUNTER — Emergency Department (HOSPITAL_COMMUNITY)
Admission: EM | Admit: 2014-04-20 | Discharge: 2014-04-20 | Disposition: A | Discharge status: Home / Self Care | Payer: Medicaid Other | Attending: Emergency Medicine | Admitting: Emergency Medicine

## 2014-04-20 ENCOUNTER — Encounter (HOSPITAL_COMMUNITY): Payer: Self-pay | Admitting: Emergency Medicine

## 2014-04-20 DIAGNOSIS — X500XXA Overexertion from strenuous movement or load, initial encounter: Secondary | ICD-10-CM | POA: Insufficient documentation

## 2014-04-20 DIAGNOSIS — Y929 Unspecified place or not applicable: Secondary | ICD-10-CM | POA: Insufficient documentation

## 2014-04-20 DIAGNOSIS — F17200 Nicotine dependence, unspecified, uncomplicated: Secondary | ICD-10-CM | POA: Insufficient documentation

## 2014-04-20 DIAGNOSIS — Z87448 Personal history of other diseases of urinary system: Secondary | ICD-10-CM | POA: Insufficient documentation

## 2014-04-20 DIAGNOSIS — M545 Low back pain, unspecified: Secondary | ICD-10-CM

## 2014-04-20 DIAGNOSIS — Y93E1 Activity, personal bathing and showering: Secondary | ICD-10-CM | POA: Insufficient documentation

## 2014-04-20 DIAGNOSIS — G8929 Other chronic pain: Secondary | ICD-10-CM | POA: Insufficient documentation

## 2014-04-20 DIAGNOSIS — Z79899 Other long term (current) drug therapy: Secondary | ICD-10-CM | POA: Insufficient documentation

## 2014-04-20 DIAGNOSIS — J4489 Other specified chronic obstructive pulmonary disease: Secondary | ICD-10-CM | POA: Insufficient documentation

## 2014-04-20 DIAGNOSIS — J449 Chronic obstructive pulmonary disease, unspecified: Secondary | ICD-10-CM | POA: Insufficient documentation

## 2014-04-20 DIAGNOSIS — Z791 Long term (current) use of non-steroidal anti-inflammatories (NSAID): Secondary | ICD-10-CM | POA: Insufficient documentation

## 2014-04-20 DIAGNOSIS — IMO0002 Reserved for concepts with insufficient information to code with codable children: Secondary | ICD-10-CM | POA: Insufficient documentation

## 2014-04-20 HISTORY — DX: Chronic obstructive pulmonary disease, unspecified: J44.9

## 2014-04-20 MED ORDER — KETOROLAC TROMETHAMINE 60 MG/2ML IM SOLN
60.0000 mg | Freq: Once | INTRAMUSCULAR | Status: AC
  Administered 2014-04-20: 60 mg via INTRAMUSCULAR
  Filled 2014-04-20: qty 2

## 2014-04-20 MED ORDER — DEXAMETHASONE SODIUM PHOSPHATE 10 MG/ML IJ SOLN
10.0000 mg | Freq: Once | INTRAMUSCULAR | Status: AC
  Administered 2014-04-20: 10 mg via INTRAMUSCULAR
  Filled 2014-04-20: qty 1

## 2014-04-20 MED ORDER — DIAZEPAM 5 MG/ML IJ SOLN
10.0000 mg | Freq: Once | INTRAMUSCULAR | Status: AC
  Administered 2014-04-20: 10 mg via INTRAMUSCULAR
  Filled 2014-04-20: qty 2

## 2014-04-20 MED ORDER — CYCLOBENZAPRINE HCL 10 MG PO TABS: 10.0000 mg | ORAL_TABLET | Freq: Three times a day (TID) | ORAL | Status: DC | PRN

## 2014-04-20 MED ORDER — NAPROXEN 500 MG PO TABS: 500.0000 mg | ORAL_TABLET | Freq: Two times a day (BID) | ORAL | Status: DC

## 2014-04-20 NOTE — ED Provider Notes (Signed)
CSN: [id number]     Arrival date & time 04/20/14  8315 History   First MD Initiated Contact with Patient 04/20/14 [phone]     Chief Complaint  Patient presents with  . Back Pain     (Consider location/radiation/quality/duration/timing/severity/associated sxs/prior Treatment) HPI Patient reports she has had chronic back problems. She states the last time she was seen at [facility] was about a month ago however she could not be seen by the physician due to some insurance problem. She states she had one epidural done about a year ago which she states made her pain worse. This was done by Dr. Nelva Bush. She states yesterday she was in the shower and she slipped. She did not fall but she got jarred. She states she had some pulling in her back immediately afterwards however during the night it got worse. She states she has a dull constant pain. She states any type of movement makes the pain gets sharp. She states the pain is radiating into her left hip which she has had before. She denies any incontinence.  PCP [facility] [address]  Past Medical History  Diagnosis Date  . Asthma   . Renal disorder     kidney infections  . COPD (chronic obstructive pulmonary disease)    Past Surgical History  Procedure Laterality Date  . Appendectomy    . Tubal ligation     Family History  Problem Relation Age of Onset  . Hypertension Mother   . Hypertension Father    History  Substance Use Topics  . Smoking status: Current Every Day Smoker -- 1.50 packs/day    Types: Cigarettes  . Smokeless tobacco: Never Used  . Alcohol Use: Yes     Comment: socially   Unemployed 4 months, was a server Down to 1 ppd smoking Lives with husband  OB History   Grav Para Term Preterm Abortions TAB SAB Ect Mult Living                 Review of Systems  All other systems reviewed and are negative.     Allergies  Bactrim  Home Medications   Prior to Admission medications   Medication Sig  Start Date End Date Taking? Authorizing Provider  acetaminophen (TYLENOL) 500 MG tablet Take 500 mg by mouth every 6 (six) hours as needed for mild pain or headache.    Historical Provider, MD  albuterol (PROVENTIL HFA;VENTOLIN HFA) 108 (90 BASE) MCG/ACT inhaler Inhale 2 puffs into the lungs every 6 (six) hours as needed for wheezing.    Historical Provider, MD  cyclobenzaprine (FLEXERIL) 10 MG tablet Take 1 tablet (10 mg total) by mouth 2 (two) times daily as needed for muscle spasms. 01/24/14   Sheila Marilu Favre, PA-C  cyclobenzaprine (FLEXERIL) 10 MG tablet Take 1 tablet (10 mg total) by mouth 3 (three) times daily as needed for muscle spasms. 04/20/14   Janice Norrie, MD  HYDROcodone-acetaminophen (NORCO/VICODIN) 5-325 MG per tablet Take 1-2 tablets by mouth every 4 (four) hours as needed. 01/24/14   Sheila Marilu Favre, PA-C  naproxen (NAPROSYN) 500 MG tablet Take 1 tablet (500 mg total) by mouth 2 (two) times daily. 04/20/14   Janice Norrie, MD   BP 113/78  Pulse 130  Temp(Src) 98.2 F (36.8 C) (Oral)  Resp 19  SpO2 95%  LMP 04/14/2014  Vital signs normal except for tachycardia  Physical Exam  Nursing note and vitals reviewed. Constitutional: She is oriented to person, place, and  time. She appears well-developed and well-nourished.  Non-toxic appearance. She does not appear ill. No distress.  HENT:  Head: Normocephalic and atraumatic.  Right Ear: External ear normal.  Left Ear: External ear normal.  Nose: Nose normal. No mucosal edema or rhinorrhea.  Mouth/Throat: Mucous membranes are normal. No dental abscesses or uvula swelling.  Eyes: Conjunctivae and EOM are normal. Pupils are equal, round, and reactive to light.  Neck: Normal range of motion and full passive range of motion without pain. Neck supple.  Pulmonary/Chest: Effort normal. No respiratory distress. She has no rhonchi. She exhibits no crepitus.  Abdominal: Soft. Normal appearance and bowel sounds are normal. She exhibits no  distension. There is no tenderness. There is no rebound and no guarding.  Musculoskeletal: Normal range of motion. She exhibits no edema and no tenderness.       Back:  Moves all extremities well. Patient has some pulling sensation in her back with straight leg raising on the left, no discomfort with straight leg raising on the right. Her patellar reflexes are 2+ and equal. Patient has diffuse tenderness of her lumbar spine and paraspinous muscles without localization. When she does range of motion of the way she is most painful when she moves the left, less so to the right or forward flexion.  Neurological: She is alert and oriented to person, place, and time. She has normal strength. No cranial nerve deficit.  Skin: Skin is warm, dry and intact. No rash noted. No erythema. No pallor.  Psychiatric: She has a normal mood and affect. Her speech is normal and behavior is normal. Her mood appears not anxious.    ED Course  Procedures (including critical care time)  Medications  dexamethasone (DECADRON) injection 10 mg (not administered)  diazepam (VALIUM) injection 10 mg (not administered)  ketorolac (TORADOL) injection 60 mg (not administered)    Review of patient's chart shows she has been seen in the ED monthly since February for back pain. She had lumbar sacral spine x-rays done on March 2 which were read as normal.   Labs Review Labs Reviewed - No data to display  Imaging Review No results found.   EKG Interpretation None      MDM   Final diagnoses:  Chronic lower back pain    New Prescriptions   CYCLOBENZAPRINE (FLEXERIL) 10 MG TABLET    Take 1 tablet (10 mg total) by mouth 3 (three) times daily as needed for muscle spasms.   NAPROXEN (NAPROSYN) 500 MG TABLET    Take 1 tablet (500 mg total) by mouth 2 (two) times daily.    Plan discharge   Rolland Porter, MD, Alanson Aly, MD 04/20/14 1011

## 2014-04-20 NOTE — Discharge Instructions (Signed)
Try ice and heat for your back. You will need to get a primary care doctor or orthopedist to manage your chronic back pain.   Chronic Pain Discharge Instructions  Emergency care providers appreciate that many patients coming to [place] are in severe pain and we wish to address their pain in the safest, most responsible manner.  It is important to recognize however, that the proper treatment of chronic pain differs from that of the pain of injuries and acute illnesses.  Our goal is to provide quality, safe, personalized care and we thank you for giving [place] the opportunity to serve you. The use of narcotics and related agents for chronic pain syndromes may lead to additional physical and psychological problems.  Nearly as many people die from prescription narcotics each year as die from car crashes.  Additionally, this risk is increased if such prescriptions are obtained from a variety of sources.  Therefore, only your primary care physician or a pain management specialist is able to safely treat such syndromes with narcotic medications long-term.    Documentation revealing such prescriptions have been sought from multiple sources may prohibit [place] from providing a refill or different narcotic medication.  Your name may be checked first through the [place].  This database is a record of controlled substance medication prescriptions that the patient has received.  This has been established by [facility] in an effort to eliminate the dangerous, and often life threatening, practice of obtaining multiple prescriptions from different medical providers.   If you have a chronic pain syndrome (i.e. chronic headaches, recurrent back or neck pain, dental pain, abdominal or pelvis pain without a specific diagnosis, or neuropathic pain such as fibromyalgia) or recurrent visits for the same condition without an acute diagnosis, you may be treated with non-narcotics and other  non-addictive medicines.  Allergic reactions or negative side effects that may be reported by a patient to such medications will not typically lead to the use of a narcotic analgesic or other controlled substance as an alternative.   Patients managing chronic pain with a personal physician should have provisions in place for breakthrough pain.  If you are in crisis, you should call your physician.  If your physician directs you to the emergency department, please have the doctor call and speak to our attending physician concerning your care.   When patients come to the Emergency Department (ED) with acute medical conditions in which the Emergency Department physician feels appropriate to prescribe narcotic or sedating pain medication, the physician will prescribe these in very limited quantities.  The amount of these medications will last only until you can see your primary care physician in his/her office.  Any patient who returns to the ED seeking refills should expect only non-narcotic pain medications.   In the event of an acute medical condition exists and the emergency physician feels it is necessary that the patient be given a narcotic or sedating medication -  a responsible adult driver should be present in the room prior to the medication being given by the nurse.   Prescriptions for narcotic or sedating medications that have been lost, stolen or expired will not be refilled in the Emergency Department.    Patients who have chronic pain may receive non-narcotic prescriptions until seen by their primary care physician.  It is every patients personal responsibility to maintain active prescriptions with his or her primary care physician or specialist.

## 2014-04-20 NOTE — ED Notes (Signed)
Pt states that she slipped in the shower/bath yesterday and already has a bulging disc and now having increased back pain.  Pt states that she hasnt been able to see her doctor due to her insurance. Pt states "last night was the longest night ever".

## 2014-05-29 ENCOUNTER — Emergency Department (HOSPITAL_COMMUNITY): Payer: Medicaid Other

## 2014-05-29 ENCOUNTER — Emergency Department (HOSPITAL_COMMUNITY)
Admission: EM | Admit: 2014-05-29 | Discharge: 2014-05-29 | Disposition: A | Discharge status: Home / Self Care | Payer: Medicaid Other | Attending: Emergency Medicine | Admitting: Emergency Medicine

## 2014-05-29 ENCOUNTER — Encounter (HOSPITAL_COMMUNITY): Payer: Self-pay | Admitting: Emergency Medicine

## 2014-05-29 DIAGNOSIS — W07XXXA Fall from chair, initial encounter: Secondary | ICD-10-CM | POA: Diagnosis not present

## 2014-05-29 DIAGNOSIS — Z8679 Personal history of other diseases of the circulatory system: Secondary | ICD-10-CM | POA: Insufficient documentation

## 2014-05-29 DIAGNOSIS — W172XXA Fall into hole, initial encounter: Secondary | ICD-10-CM | POA: Insufficient documentation

## 2014-05-29 DIAGNOSIS — R000 Tachycardia, unspecified: Secondary | ICD-10-CM | POA: Diagnosis not present

## 2014-05-29 DIAGNOSIS — M5430 Sciatica, unspecified side: Secondary | ICD-10-CM | POA: Diagnosis not present

## 2014-05-29 DIAGNOSIS — M5440 Lumbago with sciatica, unspecified side: Secondary | ICD-10-CM

## 2014-05-29 DIAGNOSIS — Y9289 Other specified places as the place of occurrence of the external cause: Secondary | ICD-10-CM | POA: Insufficient documentation

## 2014-05-29 DIAGNOSIS — Z87448 Personal history of other diseases of urinary system: Secondary | ICD-10-CM | POA: Diagnosis not present

## 2014-05-29 DIAGNOSIS — J45909 Unspecified asthma, uncomplicated: Secondary | ICD-10-CM | POA: Diagnosis not present

## 2014-05-29 DIAGNOSIS — F17200 Nicotine dependence, unspecified, uncomplicated: Secondary | ICD-10-CM | POA: Insufficient documentation

## 2014-05-29 DIAGNOSIS — Z79899 Other long term (current) drug therapy: Secondary | ICD-10-CM | POA: Diagnosis not present

## 2014-05-29 DIAGNOSIS — J4489 Other specified chronic obstructive pulmonary disease: Secondary | ICD-10-CM | POA: Insufficient documentation

## 2014-05-29 DIAGNOSIS — J449 Chronic obstructive pulmonary disease, unspecified: Secondary | ICD-10-CM | POA: Insufficient documentation

## 2014-05-29 DIAGNOSIS — IMO0002 Reserved for concepts with insufficient information to code with codable children: Secondary | ICD-10-CM | POA: Diagnosis present

## 2014-05-29 DIAGNOSIS — Y9389 Activity, other specified: Secondary | ICD-10-CM | POA: Insufficient documentation

## 2014-05-29 HISTORY — DX: Cardiac arrhythmia, unspecified: I49.9

## 2014-05-29 MED ORDER — KETOROLAC TROMETHAMINE 60 MG/2ML IM SOLN
60.0000 mg | Freq: Once | INTRAMUSCULAR | Status: AC
  Administered 2014-05-29: 60 mg via INTRAMUSCULAR
  Filled 2014-05-29: qty 2

## 2014-05-29 MED ORDER — OXYCODONE-ACETAMINOPHEN 5-325 MG PO TABS: 1.0000 | ORAL_TABLET | Freq: Four times a day (QID) | ORAL | Status: DC | PRN

## 2014-05-29 MED ORDER — DIAZEPAM 5 MG/ML IJ SOLN
2.5000 mg | Freq: Once | INTRAMUSCULAR | Status: AC
  Administered 2014-05-29: 2.5 mg via INTRAMUSCULAR
  Filled 2014-05-29: qty 2

## 2014-05-29 MED ORDER — DIAZEPAM 5 MG PO TABS: 5.0000 mg | ORAL_TABLET | Freq: Two times a day (BID) | ORAL | Status: DC

## 2014-05-29 MED ORDER — HYDROMORPHONE HCL PF 1 MG/ML IJ SOLN
1.0000 mg | Freq: Once | INTRAMUSCULAR | Status: AC
  Administered 2014-05-29: 1 mg via INTRAMUSCULAR
  Filled 2014-05-29: qty 1

## 2014-05-29 MED ORDER — HYDROMORPHONE HCL PF 1 MG/ML IJ SOLN: 0.5000 mg | Freq: Once | INTRAMUSCULAR | Status: DC

## 2014-05-29 MED ORDER — OXYCODONE-ACETAMINOPHEN 5-325 MG PO TABS
2.0000 | ORAL_TABLET | Freq: Once | ORAL | Status: AC
  Administered 2014-05-29: 2 via ORAL
  Filled 2014-05-29: qty 2

## 2014-05-29 NOTE — ED Notes (Signed)
Pt reports that she was sitting in a chair on the deck when the chair went through the deck. Pt c/o pain from mid back to low back. Pt HR 130's in triage, reports history of same. Pt has not taken any medication for pain today.

## 2014-05-29 NOTE — ED Provider Notes (Signed)
CSN: [id number]     Arrival date & time 05/29/14  1905 History   First MD Initiated Contact with Patient 05/29/14 2012     Chief Complaint  Patient presents with  . Fall     (Consider location/radiation/quality/duration/timing/severity/associated sxs/prior Treatment) HPI Comments: Patient is a 43 year old female who presents to the emergency department for back pain. Patient states that she was sitting in a chair in the back when the front leg of the chair went through the deck flooring causing her to become pinned between her chair and the table. Patient states that she has been having mid and low back pain since this time. Patient has tried Tylenol for her symptoms without relief. She states that movement makes her pain worse. She states that she has been feeling an aching pain in her back which intimately radiates to her bilateral posterior knees. Patient denies associated fever, urinary symptoms, numbness, extremity weakness, bowel or bladder incontinence. No history of back surgery.  Patient is a 43 y.o. female presenting with fall. The history is provided by the patient. No language interpreter was used.  Fall Pertinent negatives include no fever, numbness or weakness.    Past Medical History  Diagnosis Date  . Asthma   . Renal disorder     kidney infections  . COPD (chronic obstructive pulmonary disease)   . Irregular heart rate    Past Surgical History  Procedure Laterality Date  . Appendectomy    . Tubal ligation     Family History  Problem Relation Age of Onset  . Hypertension Mother   . Hypertension Father    History  Substance Use Topics  . Smoking status: Current Every Day Smoker -- 1.50 packs/day    Types: Cigarettes  . Smokeless tobacco: Never Used  . Alcohol Use: Yes     Comment: rare   OB History   Grav Para Term Preterm Abortions TAB SAB Ect Mult Living                  Review of Systems  Constitutional: Negative for fever.  Musculoskeletal:  Positive for back pain. Negative for gait problem.  Neurological: Negative for weakness and numbness.  All other systems reviewed and are negative.    Allergies  Bactrim  Home Medications   Prior to Admission medications   Medication Sig Start Date End Date Taking? Authorizing Provider  albuterol (PROVENTIL HFA;VENTOLIN HFA) 108 (90 BASE) MCG/ACT inhaler Inhale 2 puffs into the lungs every 6 (six) hours as needed for wheezing or shortness of breath.    Yes Historical Provider, MD  diphenhydramine-acetaminophen (TYLENOL PM) 25-500 MG TABS Take 1 tablet by mouth at bedtime as needed (sleep/pain).   Yes Historical Provider, MD  ibuprofen (ADVIL,MOTRIN) 200 MG tablet Take 400 mg by mouth 2 (two) times daily as needed (pain).   Yes Historical Provider, MD  diazepam (VALIUM) 5 MG tablet Take 1 tablet (5 mg total) by mouth 2 (two) times daily. 05/29/14   Antonietta Breach, PA-C  oxyCODONE-acetaminophen (PERCOCET/ROXICET) 5-325 MG per tablet Take 1-2 tablets by mouth every 6 (six) hours as needed for moderate pain or severe pain. 05/29/14   Antonietta Breach, PA-C   BP 124/79  Pulse 117  Temp(Src) 97.8 F (36.6 C) (Oral)  Resp 27  Ht 5\' 8"  (1.727 m)  Wt 210 lb (95.255 kg)  BMI 31.94 kg/m2  SpO2 94%  LMP 05/15/2014  Physical Exam  Nursing note and vitals reviewed. Constitutional: She is oriented to person, place,  and time. She appears well-developed and well-nourished. No distress.  Nontoxic/nonseptic appearing  HENT:  Head: Normocephalic and atraumatic.  Eyes: Conjunctivae and EOM are normal. No scleral icterus.  Neck: Normal range of motion.  Cardiovascular: Regular rhythm and normal heart sounds.   Intermittent tachycardia; patient states this is her baseline.  Pulmonary/Chest: Effort normal. No respiratory distress. She has no wheezes.  Musculoskeletal: She exhibits tenderness.  Decreased ROM secondary to pain. No bony deformities, step-off, or crepitus to thoracic or lumbosacral midline. Mild  bilateral spasm of lumbar paraspinal muscles. Mild, positive straight leg raise.  Neurological: She is alert and oriented to person, place, and time. She displays normal reflexes. She exhibits normal muscle tone. Coordination normal.  GCS 15. Patient moves extremities without ataxia. No gross sensory deficits appreciated in bilateral lower extremities. DTRs normal and symmetric.  Skin: Skin is warm and dry. No rash noted. She is not diaphoretic. No erythema. No pallor.  Psychiatric: She has a normal mood and affect. Her behavior is normal.    ED Course  Procedures (including critical care time) Labs Review Labs Reviewed - No data to display  Imaging Review Dg Lumbar Spine Complete  05/29/2014   CLINICAL DATA:  Back pain to the knees after injury.  EXAM: LUMBAR SPINE - COMPLETE 4+ VIEW  COMPARISON:  01/24/2014  FINDINGS: There is no evidence of lumbar spine fracture. Alignment is normal. Intervertebral disc spaces are maintained.  IMPRESSION: Negative.   Electronically Signed   By: Lucienne Capers M.D.   On: 05/29/2014 22:29     EKG Interpretation None      MDM   Final diagnoses:  Low back pain with sciatica, sciatica laterality unspecified, unspecified back pain laterality    Patient with back pain x 2 days after chair leg fell through deck yesterday while patient seated on chair. Patient neurovascularly intact. No gross sensory deficits appreciated. Patient has been ambulatory since the incident without difficulty. No loss of bowel or bladder control. No concern for cauda equina.  No fever, night sweats, weight loss, h/o cancer, IVDU. Imaging today is negative. RICE protocol and pain medicine indicated and discussed with patient. Pain improved over ED course with pain medicine, anti-inflammatories, and muscle relaxer. She is stable for discharge instructions followup with her primary care doctor. Return precautions provided and patient agreeable to plan with no unaddressed  concerns.   Filed Vitals:   05/29/14 2021 05/29/14 2030 05/29/14 2130 05/29/14 2300  BP: 125/86 119/78 124/79 120/71  Pulse: 110 101 117 100  Temp:      TempSrc:      Resp:  15 27 20   Height:      Weight:      SpO2: 95% 94% 94% 94%     Antonietta Breach, PA-C 05/29/14 2329

## 2014-05-29 NOTE — Discharge Instructions (Signed)
Recommend 600 mg ibuprofen every 6 hours as well as Valium as prescribed for symptoms. You may take Percocet for severe pain as prescribed. Refrain from strenuous activity or heavy lifting for one week. Followup with your primary care doctor to ensure symptoms resolve.  Back Pain, Adult Low back pain is very common. About 1 in 5 people have back pain.The cause of low back pain is rarely dangerous. The pain often gets better over time.About half of people with a sudden onset of back pain feel better in just 2 weeks. About 8 in 10 people feel better by 6 weeks.  CAUSES Some common causes of back pain include:  Strain of the muscles or ligaments supporting the spine.  Wear and tear (degeneration) of the spinal discs.  Arthritis.  Direct injury to the back. DIAGNOSIS Most of the time, the direct cause of low back pain is not known.However, back pain can be treated effectively even when the exact cause of the pain is unknown.Answering your caregiver's questions about your overall health and symptoms is one of the most accurate ways to make sure the cause of your pain is not dangerous. If your caregiver needs more information, he or she may order lab work or imaging tests (X-rays or MRIs).However, even if imaging tests show changes in your back, this usually does not require surgery. HOME CARE INSTRUCTIONS For many people, back pain returns.Since low back pain is rarely dangerous, it is often a condition that people can learn to [facility] their own.   Remain active. It is stressful on the back to sit or stand in one place. Do not sit, drive, or stand in one place for more than 30 minutes at a time. Take short walks on level surfaces as soon as pain allows.Try to increase the length of time you walk each day.  Do not stay in bed.Resting more than 1 or 2 days can delay your recovery.  Do not avoid exercise or work.Your body is made to move.It is not dangerous to be active, even though your  back may hurt.Your back will likely heal faster if you return to being active before your pain is gone.  Pay attention to your body when you bend and lift. Many people have less discomfortwhen lifting if they bend their knees, keep the load close to their bodies,and avoid twisting. Often, the most comfortable positions are those that put less stress on your recovering back.  Find a comfortable position to sleep. Use a firm mattress and lie on your side with your knees slightly bent. If you lie on your back, put a pillow under your knees.  Only take over-the-counter or prescription medicines as directed by your caregiver. Over-the-counter medicines to reduce pain and inflammation are often the most helpful.Your caregiver may prescribe muscle relaxant drugs.These medicines help dull your pain so you can more quickly return to your normal activities and healthy exercise.  Put ice on the injured area.  Put ice in a plastic bag.  Place a towel between your skin and the bag.  Leave the ice on for 15-20 minutes, 03-04 times a day for the first 2 to 3 days. After that, ice and heat may be alternated to reduce pain and spasms.  Ask your caregiver about trying back exercises and gentle massage. This may be of some benefit.  Avoid feeling anxious or stressed.Stress increases muscle tension and can worsen back pain.It is important to recognize when you are anxious or stressed and learn ways to manage  it.Exercise is a great option. SEEK MEDICAL CARE IF:  You have pain that is not relieved with rest or medicine.  You have pain that does not improve in 1 week.  You have new symptoms.  You are generally not feeling well. SEEK IMMEDIATE MEDICAL CARE IF:   You have pain that radiates from your back into your legs.  You develop new bowel or bladder control problems.  You have unusual weakness or numbness in your arms or legs.  You develop nausea or vomiting.  You develop abdominal  pain.  You feel faint. Document Released: 11/11/2005 Document Revised: 05/12/2012 Document Reviewed: 04/01/2011 [facility] Patient Information 2015 [place], [place]. This information is not intended to replace advice given to you by your health care provider. Make sure you discuss any questions you have with your health care provider.

## 2014-05-30 NOTE — ED Provider Notes (Signed)
Medical screening examination/treatment/procedure(s) were performed by non-physician practitioner and as supervising physician I was immediately available for consultation/collaboration.   EKG Interpretation None        Ephraim Hamburger, MD 05/30/14 1254

## 2014-05-31 ENCOUNTER — Encounter (HOSPITAL_COMMUNITY): Payer: Self-pay | Admitting: Emergency Medicine

## 2014-05-31 ENCOUNTER — Emergency Department (HOSPITAL_COMMUNITY)
Admission: EM | Admit: 2014-05-31 | Discharge: 2014-05-31 | Disposition: A | Discharge status: Home / Self Care | Payer: Medicaid Other | Attending: Emergency Medicine | Admitting: Emergency Medicine

## 2014-05-31 DIAGNOSIS — J449 Chronic obstructive pulmonary disease, unspecified: Secondary | ICD-10-CM | POA: Insufficient documentation

## 2014-05-31 DIAGNOSIS — M545 Low back pain, unspecified: Secondary | ICD-10-CM

## 2014-05-31 DIAGNOSIS — Z8679 Personal history of other diseases of the circulatory system: Secondary | ICD-10-CM | POA: Insufficient documentation

## 2014-05-31 DIAGNOSIS — J4489 Other specified chronic obstructive pulmonary disease: Secondary | ICD-10-CM | POA: Insufficient documentation

## 2014-05-31 DIAGNOSIS — Z87828 Personal history of other (healed) physical injury and trauma: Secondary | ICD-10-CM | POA: Insufficient documentation

## 2014-05-31 DIAGNOSIS — F17200 Nicotine dependence, unspecified, uncomplicated: Secondary | ICD-10-CM | POA: Diagnosis not present

## 2014-05-31 DIAGNOSIS — Z79899 Other long term (current) drug therapy: Secondary | ICD-10-CM | POA: Diagnosis not present

## 2014-05-31 DIAGNOSIS — Z87448 Personal history of other diseases of urinary system: Secondary | ICD-10-CM | POA: Insufficient documentation

## 2014-05-31 DIAGNOSIS — R52 Pain, unspecified: Secondary | ICD-10-CM | POA: Insufficient documentation

## 2014-05-31 DIAGNOSIS — IMO0002 Reserved for concepts with insufficient information to code with codable children: Secondary | ICD-10-CM | POA: Insufficient documentation

## 2014-05-31 DIAGNOSIS — G8929 Other chronic pain: Secondary | ICD-10-CM | POA: Diagnosis not present

## 2014-05-31 MED ORDER — OXYCODONE-ACETAMINOPHEN 5-325 MG PO TABS: ORAL_TABLET | ORAL | Status: DC

## 2014-05-31 MED ORDER — PREDNISONE 20 MG PO TABS: ORAL_TABLET | ORAL | Status: DC

## 2014-05-31 MED ORDER — KETOROLAC TROMETHAMINE 60 MG/2ML IM SOLN
30.0000 mg | Freq: Once | INTRAMUSCULAR | Status: AC
  Administered 2014-05-31: 30 mg via INTRAMUSCULAR
  Filled 2014-05-31: qty 2

## 2014-05-31 MED ORDER — DIAZEPAM 5 MG PO TABS: 5.0000 mg | ORAL_TABLET | Freq: Two times a day (BID) | ORAL | Status: DC

## 2014-05-31 NOTE — ED Notes (Addendum)
Patient with back pain.  Patient states she is now feeling numbness in her bilateral hips and in her left toes.  Patient is CAOx3.  Patient was injured on Saturday.  Patient was seen on Sunday for same.  She states that the meds that are not working for her that were given to her.

## 2014-05-31 NOTE — Discharge Instructions (Signed)
Please take ibuprofen 400mg  (this is normally 2 over the counter pills) every 6 hours (take with food to minimze stomach irritation).   Take valium and/or percocet for breakthrough pain, do not drink alcohol, drive, care for children or perfom other critical tasks while taking valium and/or percocet.  Please follow with your primary care doctor in the next 2 days for a check-up. They must obtain records for further management.   Do not hesitate to return to the Emergency Department for any new, worsening or concerning symptoms.    Back Pain, Adult Low back pain is very common. About 1 in 5 people have back pain.The cause of low back pain is rarely dangerous. The pain often gets better over time.About half of people with a sudden onset of back pain feel better in just 2 weeks. About 8 in 10 people feel better by 6 weeks.  CAUSES Some common causes of back pain include:  Strain of the muscles or ligaments supporting the spine.  Wear and tear (degeneration) of the spinal discs.  Arthritis.  Direct injury to the back. DIAGNOSIS Most of the time, the direct cause of low back pain is not known.However, back pain can be treated effectively even when the exact cause of the pain is unknown.Answering your caregiver's questions about your overall health and symptoms is one of the most accurate ways to make sure the cause of your pain is not dangerous. If your caregiver needs more information, he or she may order lab work or imaging tests (X-rays or MRIs).However, even if imaging tests show changes in your back, this usually does not require surgery. HOME CARE INSTRUCTIONS For many people, back pain returns.Since low back pain is rarely dangerous, it is often a condition that people can learn to [facility] their own.   Remain active. It is stressful on the back to sit or stand in one place. Do not sit, drive, or stand in one place for more than 30 minutes at a time. Take short walks on level  surfaces as soon as pain allows.Try to increase the length of time you walk each day.  Do not stay in bed.Resting more than 1 or 2 days can delay your recovery.  Do not avoid exercise or work.Your body is made to move.It is not dangerous to be active, even though your back may hurt.Your back will likely heal faster if you return to being active before your pain is gone.  Pay attention to your body when you bend and lift. Many people have less discomfortwhen lifting if they bend their knees, keep the load close to their bodies,and avoid twisting. Often, the most comfortable positions are those that put less stress on your recovering back.  Find a comfortable position to sleep. Use a firm mattress and lie on your side with your knees slightly bent. If you lie on your back, put a pillow under your knees.  Only take over-the-counter or prescription medicines as directed by your caregiver. Over-the-counter medicines to reduce pain and inflammation are often the most helpful.Your caregiver may prescribe muscle relaxant drugs.These medicines help dull your pain so you can more quickly return to your normal activities and healthy exercise.  Put ice on the injured area.  Put ice in a plastic bag.  Place a towel between your skin and the bag.  Leave the ice on for 15-20 minutes, 03-04 times a day for the first 2 to 3 days. After that, ice and heat may be alternated to reduce pain  and spasms.  Ask your caregiver about trying back exercises and gentle massage. This may be of some benefit.  Avoid feeling anxious or stressed.Stress increases muscle tension and can worsen back pain.It is important to recognize when you are anxious or stressed and learn ways to manage it.Exercise is a great option. SEEK MEDICAL CARE IF:  You have pain that is not relieved with rest or medicine.  You have pain that does not improve in 1 week.  You have new symptoms.  You are generally not feeling  well. SEEK IMMEDIATE MEDICAL CARE IF:   You have pain that radiates from your back into your legs.  You develop new bowel or bladder control problems.  You have unusual weakness or numbness in your arms or legs.  You develop nausea or vomiting.  You develop abdominal pain.  You feel faint. Document Released: 11/11/2005 Document Revised: 05/12/2012 Document Reviewed: 04/01/2011 [facility] Patient Information 2015 [place], [place]. This information is not intended to replace advice given to you by your health care provider. Make sure you discuss any questions you have with your health care provider.

## 2014-05-31 NOTE — ED Provider Notes (Signed)
CSN: [id number]     Arrival date & time 05/31/14  2000 History  This chart was scribed for non-physician practitioner, Monico Blitz, PA-C working with Ezequiel Essex, MD by Frederich Balding, ED scribe. This patient was seen in room TR11C/TR11C and the patient's care was started at 8:57 PM.   Chief Complaint  Patient presents with  . Back Pain   The history is provided by the patient. No language interpreter was used.   HPI Comments: [name] is a 43 y.o. female with history of bulging disc who presents to the Emergency Department complaining of gradual onset lower back pain that started 3 days ago after injury. Reports tingling in bilateral toes. Pt was evaluated 2 days ago after the injury and given IM dilaudid and valium. Xray was negative. She was discharged home with valium and percocet and states they have provided some relief. Certain movements worsen the pain. Pt is trying to get into [employer] and states she is waiting for her medicaid to go through before she can see them. Denies fever, bowel or bladder incontinence. Denies history of cancer or IV drug use. Denies history of diabetes.   Past Medical History  Diagnosis Date  . Asthma   . Renal disorder     kidney infections  . COPD (chronic obstructive pulmonary disease)   . Irregular heart rate    Past Surgical History  Procedure Laterality Date  . Appendectomy    . Tubal ligation     Family History  Problem Relation Age of Onset  . Hypertension Mother   . Hypertension Father    History  Substance Use Topics  . Smoking status: Current Every Day Smoker -- 1.50 packs/day    Types: Cigarettes  . Smokeless tobacco: Never Used  . Alcohol Use: Yes     Comment: rare   OB History   Grav Para Term Preterm Abortions TAB SAB Ect Mult Living                 Review of Systems  Constitutional: Negative for fever.  Genitourinary:       Negative for bowel or bladder incontinence.  Musculoskeletal: Positive  for back pain.  All other systems reviewed and are negative.  Allergies  Bactrim  Home Medications   Prior to Admission medications   Medication Sig Start Date End Date Taking? Authorizing Provider  albuterol (PROVENTIL HFA;VENTOLIN HFA) 108 (90 BASE) MCG/ACT inhaler Inhale 2 puffs into the lungs every 6 (six) hours as needed for wheezing or shortness of breath.     Historical Provider, MD  diazepam (VALIUM) 5 MG tablet Take 1 tablet (5 mg total) by mouth 2 (two) times daily. 05/29/14   Antonietta Breach, PA-C  diazepam (VALIUM) 5 MG tablet Take 1 tablet (5 mg total) by mouth 2 (two) times daily. 05/31/14   Murtaza Shell, PA-C  diphenhydramine-acetaminophen (TYLENOL PM) 25-500 MG TABS Take 1 tablet by mouth at bedtime as needed (sleep/pain).    Historical Provider, MD  ibuprofen (ADVIL,MOTRIN) 200 MG tablet Take 400 mg by mouth 2 (two) times daily as needed (pain).    Historical Provider, MD  oxyCODONE-acetaminophen (PERCOCET/ROXICET) 5-325 MG per tablet Take 1-2 tablets by mouth every 6 (six) hours as needed for moderate pain or severe pain. 05/29/14   Antonietta Breach, PA-C  oxyCODONE-acetaminophen (PERCOCET/ROXICET) 5-325 MG per tablet 1 to 2 tabs PO q6hrs  PRN for pain 05/31/14   Elmyra Ricks Cherina Dhillon, PA-C  predniSONE (DELTASONE) 20 MG tablet 3 tabs po  daily x 3 days, then 2 tabs x 3 days, then 1.5 tabs x 3 days, then 1 tab x 3 days, then 0.5 tabs x 3 days 05/31/14   Elmyra Ricks Jmya Uliano, PA-C   BP 100/61  Pulse 70  Temp(Src) 98.3 F (36.8 C) (Oral)  Resp 22  SpO2 99%  LMP 05/15/2014  Physical Exam  Nursing note and vitals reviewed. Constitutional: She is oriented to person, place, and time. She appears well-developed and well-nourished. No distress.  HENT:  Head: Normocephalic.  Eyes: Conjunctivae and EOM are normal.  Cardiovascular: Normal rate.   Pulmonary/Chest: Effort normal. No stridor.  Musculoskeletal: Normal range of motion.  No point tenderness to percussion of lumbar spinal processes.  No  TTP or paraspinal muscular spasm. Strength is 5 out of 5 to bilateral lower extremities at hip and knee; extensor hallucis longus 5 out of 5. Ankle strength 5 out of 5, no clonus, neurovascularly intact. No saddle anaesthesia. Patellar reflexes are 2+ bilaterally.       Neurological: She is alert and oriented to person, place, and time.  Psychiatric: She has a normal mood and affect.    ED Course  Procedures (including critical care time)  DIAGNOSTIC STUDIES: Oxygen Saturation is 99% on RA, normal by my interpretation.    COORDINATION OF CARE: 9:03 PM-Discussed treatment plan which includes prednisone with pt at bedside and pt agreed to plan. Will give pt an orthopedic referral and advised her to follow up.   Labs Review Labs Reviewed - No data to display  Imaging Review No results found.   EKG Interpretation None      MDM   Final diagnoses:  Acute exacerbation of chronic low back pain    Filed Vitals:   05/31/14 2009  BP: 100/61  Pulse: 70  Temp: 98.3 F (36.8 C)  TempSrc: Oral  Resp: 22  SpO2: 99%    Medications  ketorolac (TORADOL) injection 30 mg (30 mg Intramuscular Given 05/31/14 2113)    [name] is a 43 y.o. female presenting with  back pain. Review of the [place] shows no recent narcotic scripts. All of the narcotics dispensed have been from our ED providers and there have been 2 reported in the last 6 months. No neurological deficits and normal neuro exam.  Patient can walk but states is painful.  No loss of bowel or bladder control.  No concern for cauda equina.  No fever, night sweats, weight loss, h/o cancer, IVDU.  RICE protocol and pain medicine indicated and discussed with patient.   Evaluation does not show pathology that would require ongoing emergent intervention or inpatient treatment. Pt is hemodynamically stable and mentating appropriately. Discussed findings and plan with patient/guardian, who agrees with care plan. All questions  answered. Return precautions discussed and outpatient follow up given.   Discharge Medication List as of 05/31/2014  9:10 PM    START taking these medications   Details  !! diazepam (VALIUM) 5 MG tablet Take 1 tablet (5 mg total) by mouth 2 (two) times daily., Starting 05/31/2014, Until Discontinued, Print    !! oxyCODONE-acetaminophen (PERCOCET/ROXICET) 5-325 MG per tablet 1 to 2 tabs PO q6hrs  PRN for pain, Print    predniSONE (DELTASONE) 20 MG tablet 3 tabs po daily x 3 days, then 2 tabs x 3 days, then 1.5 tabs x 3 days, then 1 tab x 3 days, then 0.5 tabs x 3 days, Print     !! - Potential duplicate medications found. Please discuss  with provider.         I personally performed the services described in this documentation, which was scribed in my presence. The recorded information has been reviewed and is accurate.  Monico Blitz, PA-C 06/01/14 [phone]

## 2014-06-01 NOTE — ED Provider Notes (Signed)
Medical screening examination/treatment/procedure(s) were performed by non-physician practitioner and as supervising physician I was immediately available for consultation/collaboration.   EKG Interpretation None        Ezequiel Essex, MD 06/01/14 [phone]

## 2015-01-08 ENCOUNTER — Emergency Department (HOSPITAL_COMMUNITY): Payer: Medicaid Other

## 2015-01-08 ENCOUNTER — Emergency Department (HOSPITAL_COMMUNITY)
Admission: EM | Admit: 2015-01-08 | Discharge: 2015-01-08 | Disposition: A | Discharge status: Home / Self Care | Payer: Medicaid Other | Attending: Emergency Medicine | Admitting: Emergency Medicine

## 2015-01-08 ENCOUNTER — Encounter (HOSPITAL_COMMUNITY): Payer: Self-pay

## 2015-01-08 DIAGNOSIS — S6010XA Contusion of unspecified finger with damage to nail, initial encounter: Secondary | ICD-10-CM

## 2015-01-08 DIAGNOSIS — S6991XA Unspecified injury of right wrist, hand and finger(s), initial encounter: Secondary | ICD-10-CM | POA: Diagnosis present

## 2015-01-08 DIAGNOSIS — Y9389 Activity, other specified: Secondary | ICD-10-CM | POA: Diagnosis not present

## 2015-01-08 DIAGNOSIS — S60012A Contusion of left thumb without damage to nail, initial encounter: Secondary | ICD-10-CM | POA: Diagnosis not present

## 2015-01-08 DIAGNOSIS — W231XXA Caught, crushed, jammed, or pinched between stationary objects, initial encounter: Secondary | ICD-10-CM | POA: Diagnosis not present

## 2015-01-08 DIAGNOSIS — Z79899 Other long term (current) drug therapy: Secondary | ICD-10-CM | POA: Insufficient documentation

## 2015-01-08 DIAGNOSIS — Z720 Tobacco use: Secondary | ICD-10-CM | POA: Insufficient documentation

## 2015-01-08 DIAGNOSIS — Y9289 Other specified places as the place of occurrence of the external cause: Secondary | ICD-10-CM | POA: Insufficient documentation

## 2015-01-08 DIAGNOSIS — Y998 Other external cause status: Secondary | ICD-10-CM | POA: Insufficient documentation

## 2015-01-08 DIAGNOSIS — J449 Chronic obstructive pulmonary disease, unspecified: Secondary | ICD-10-CM | POA: Insufficient documentation

## 2015-01-08 DIAGNOSIS — Z87448 Personal history of other diseases of urinary system: Secondary | ICD-10-CM | POA: Insufficient documentation

## 2015-01-08 MED ORDER — HYDROCODONE-ACETAMINOPHEN 5-325 MG PO TABS
1.0000 | ORAL_TABLET | Freq: Once | ORAL | Status: AC
  Administered 2015-01-08: 1 via ORAL
  Filled 2015-01-08: qty 1

## 2015-01-08 MED ORDER — HYDROCODONE-ACETAMINOPHEN 5-325 MG PO TABS: 1.0000 | ORAL_TABLET | Freq: Four times a day (QID) | ORAL | Status: DC | PRN

## 2015-01-08 NOTE — ED Notes (Signed)
Patient to xray.

## 2015-01-08 NOTE — ED Provider Notes (Signed)
CSN: [id number]     Arrival date & time 01/08/15  2440 History   First MD Initiated Contact with Patient 01/08/15 0957     Chief Complaint  Patient presents with  . Finger Injury     (Consider location/radiation/quality/duration/timing/severity/associated sxs/prior Treatment) HPI Comments: Pt states that she hurt her right thumb in the lettuce shredder a short time ago. Denies previous injury. Denies numbness. Has full rom. States that it hurts to her distal thumb  The history is provided by the patient. No language interpreter was used.    Past Medical History  Diagnosis Date  . Asthma   . Renal disorder     kidney infections  . COPD (chronic obstructive pulmonary disease)   . Irregular heart rate    Past Surgical History  Procedure Laterality Date  . Appendectomy    . Tubal ligation     Family History  Problem Relation Age of Onset  . Hypertension Mother   . Hypertension Father    History  Substance Use Topics  . Smoking status: Current Every Day Smoker -- 1.50 packs/day    Types: Cigarettes  . Smokeless tobacco: Never Used  . Alcohol Use: Yes     Comment: rare   OB History    No data available     Review of Systems  All other systems reviewed and are negative.     Allergies  Bactrim  Home Medications   Prior to Admission medications   Medication Sig Start Date End Date Taking? Authorizing Provider  albuterol (PROVENTIL HFA;VENTOLIN HFA) 108 (90 BASE) MCG/ACT inhaler Inhale 2 puffs into the lungs every 6 (six) hours as needed for wheezing or shortness of breath.     Historical Provider, MD  diazepam (VALIUM) 5 MG tablet Take 1 tablet (5 mg total) by mouth 2 (two) times daily. 05/29/14   Antonietta Breach, PA-C  diazepam (VALIUM) 5 MG tablet Take 1 tablet (5 mg total) by mouth 2 (two) times daily. 05/31/14   Nicole Pisciotta, PA-C  diphenhydramine-acetaminophen (TYLENOL PM) 25-500 MG TABS Take 1 tablet by mouth at bedtime as needed (sleep/pain).    Historical  Provider, MD  ibuprofen (ADVIL,MOTRIN) 200 MG tablet Take 400 mg by mouth 2 (two) times daily as needed (pain).    Historical Provider, MD  oxyCODONE-acetaminophen (PERCOCET/ROXICET) 5-325 MG per tablet Take 1-2 tablets by mouth every 6 (six) hours as needed for moderate pain or severe pain. 05/29/14   Antonietta Breach, PA-C  oxyCODONE-acetaminophen (PERCOCET/ROXICET) 5-325 MG per tablet 1 to 2 tabs PO q6hrs  PRN for pain 05/31/14   Elmyra Ricks Pisciotta, PA-C  predniSONE (DELTASONE) 20 MG tablet 3 tabs po daily x 3 days, then 2 tabs x 3 days, then 1.5 tabs x 3 days, then 1 tab x 3 days, then 0.5 tabs x 3 days 05/31/14   Elmyra Ricks Pisciotta, PA-C   BP 102/64 mmHg  Pulse 102  Temp(Src) 97.6 F (36.4 C) (Oral)  Resp 18  SpO2 98%  LMP 01/02/2015 Physical Exam  Constitutional: She is oriented to person, place, and time. She appears well-developed and well-nourished.  Cardiovascular: Normal rate and regular rhythm.   Pulmonary/Chest: Effort normal and breath sounds normal.  Musculoskeletal: Normal range of motion.  Neurological: She is alert and oriented to person, place, and time.  Skin:  Small discoloration noted to the right thumb  Nursing note and vitals reviewed.   ED Course  Procedures (including critical care time) Labs Review Labs Reviewed - No data to display  Imaging Review Dg Finger Thumb Right  01/08/2015   CLINICAL DATA:  Right thumb injury with tissue knee tonsil. Distal thumb pain and swelling.  EXAM: RIGHT THUMB 2+V  COMPARISON:  None.  FINDINGS: There is no evidence of fracture or dislocation. There is no evidence of arthropathy or other focal bone abnormality. Soft tissues are unremarkable. No evidence of radiopaque foreign body.  IMPRESSION: Negative.   Electronically Signed   By: Earle Gell M.D.   On: 01/08/2015 10:25     EKG Interpretation None      MDM   Final diagnoses:  Subungual hematoma of digit of hand, initial encounter    No fracture noted. Pt refusing drainage of  hematoma.    Glendell Docker, NP 01/08/15 [place], MD 01/08/15 1606

## 2015-01-08 NOTE — Discharge Instructions (Signed)
Subungual Hematoma A subungual hematoma is a pocket of blood that collects under the fingernail or toenail. The pressure created by the blood under the nail can cause pain. CAUSES  A subungual hematoma occurs when an injury to the finger or toe causes a blood vessel beneath the nail to break. The injury can occur from a direct blow such as slamming a finger in a door. It can also occur from a repeated injury such as pressure on the foot in a shoe while running. A subungual hematoma is sometimes called runner's toe or tennis toe. SYMPTOMS   Blue or dark blue skin under the nail.  Pain or throbbing in the injured area. DIAGNOSIS  Your caregiver can determine whether you have a subungual hematoma based on your history and a physical exam. If your caregiver thinks you might have a broken (fractured) bone, X-rays may be taken. TREATMENT  Hematomas usually go away on their own over time. Your caregiver may make a hole in the nail to drain the blood. Draining the blood is painless and usually provides significant relief from pain and throbbing. The nail usually grows back normally after this procedure. In some cases, the nail may need to be removed. This is done if there is a cut under the nail that requires stitches (sutures). HOME CARE INSTRUCTIONS   Put ice on the injured area.  Put ice in a plastic bag.  Place a towel between your skin and the bag.  Leave the ice on for 15-20 minutes, 03-04 times a day for the first 1 to 2 days.  Elevate the injured area to help decrease pain and swelling.  If you were given a bandage, wear it for as long as directed by your caregiver.  If part of your nail falls off, trim the remaining nail gently. This prevents the nail from catching on something and causing further injury.  Only take over-the-counter or prescription medicines for pain, discomfort, or fever as directed by your caregiver. SEEK IMMEDIATE MEDICAL CARE IF:   You have redness or swelling  around the nail.  You have yellowish-white fluid (pus) coming from the nail.  Your pain is not controlled with medicine.  You have a fever. MAKE SURE YOU:  Understand these instructions.  Will watch your condition.  Will get help right away if you are not doing well or get worse. Document Released: 11/08/2000 Document Revised: 02/03/2012 Document Reviewed: 10/30/2011 [facility] Patient Information 2015 [place], [place]. This information is not intended to replace advice given to you by your health care provider. Make sure you discuss any questions you have with your health care provider.

## 2015-01-08 NOTE — ED Notes (Signed)
Patient presents today with a chief complaint of pain, swelling, and bruising to right 1st digit after accidentally smashing it in a lettuce shredder about 30 min ago while at work. No obvious deformity, skin intact.

## 2015-02-09 ENCOUNTER — Emergency Department (HOSPITAL_COMMUNITY): Payer: Medicaid Other

## 2015-02-09 ENCOUNTER — Encounter (HOSPITAL_COMMUNITY): Payer: Self-pay | Admitting: Emergency Medicine

## 2015-02-09 ENCOUNTER — Emergency Department (HOSPITAL_COMMUNITY)
Admission: EM | Admit: 2015-02-09 | Discharge: 2015-02-09 | Disposition: A | Discharge status: Home / Self Care | Payer: Medicaid Other | Attending: Emergency Medicine | Admitting: Emergency Medicine

## 2015-02-09 DIAGNOSIS — W19XXXA Unspecified fall, initial encounter: Secondary | ICD-10-CM

## 2015-02-09 DIAGNOSIS — J45909 Unspecified asthma, uncomplicated: Secondary | ICD-10-CM | POA: Diagnosis not present

## 2015-02-09 DIAGNOSIS — Z79899 Other long term (current) drug therapy: Secondary | ICD-10-CM | POA: Diagnosis not present

## 2015-02-09 DIAGNOSIS — S9032XA Contusion of left foot, initial encounter: Secondary | ICD-10-CM | POA: Diagnosis not present

## 2015-02-09 DIAGNOSIS — Z720 Tobacco use: Secondary | ICD-10-CM | POA: Insufficient documentation

## 2015-02-09 DIAGNOSIS — Y998 Other external cause status: Secondary | ICD-10-CM | POA: Diagnosis not present

## 2015-02-09 DIAGNOSIS — Z8742 Personal history of other diseases of the female genital tract: Secondary | ICD-10-CM | POA: Diagnosis not present

## 2015-02-09 DIAGNOSIS — Y9389 Activity, other specified: Secondary | ICD-10-CM | POA: Insufficient documentation

## 2015-02-09 DIAGNOSIS — Y92096 Garden or yard of other non-institutional residence as the place of occurrence of the external cause: Secondary | ICD-10-CM | POA: Diagnosis not present

## 2015-02-09 DIAGNOSIS — W172XXA Fall into hole, initial encounter: Secondary | ICD-10-CM | POA: Diagnosis not present

## 2015-02-09 DIAGNOSIS — S99922A Unspecified injury of left foot, initial encounter: Secondary | ICD-10-CM | POA: Diagnosis present

## 2015-02-09 DIAGNOSIS — Z8679 Personal history of other diseases of the circulatory system: Secondary | ICD-10-CM | POA: Insufficient documentation

## 2015-02-09 DIAGNOSIS — M79672 Pain in left foot: Secondary | ICD-10-CM

## 2015-02-09 DIAGNOSIS — Z7952 Long term (current) use of systemic steroids: Secondary | ICD-10-CM | POA: Diagnosis not present

## 2015-02-09 DIAGNOSIS — R000 Tachycardia, unspecified: Secondary | ICD-10-CM | POA: Diagnosis not present

## 2015-02-09 DIAGNOSIS — J449 Chronic obstructive pulmonary disease, unspecified: Secondary | ICD-10-CM | POA: Diagnosis not present

## 2015-02-09 MED ORDER — NAPROXEN 500 MG PO TABS: 500.0000 mg | ORAL_TABLET | Freq: Two times a day (BID) | ORAL | Status: DC | PRN

## 2015-02-09 MED ORDER — HYDROCODONE-ACETAMINOPHEN 5-325 MG PO TABS: 1.0000 | ORAL_TABLET | Freq: Four times a day (QID) | ORAL | Status: DC | PRN

## 2015-02-09 MED ORDER — HYDROCODONE-ACETAMINOPHEN 5-325 MG PO TABS
1.0000 | ORAL_TABLET | Freq: Once | ORAL | Status: AC
  Administered 2015-02-09: 1 via ORAL
  Filled 2015-02-09: qty 1

## 2015-02-09 NOTE — ED Provider Notes (Signed)
CSN: [id number]     Arrival date & time 02/09/15  1703 History  This chart was scribed for non-physician practitioner, Zacarias Pontes, PA-C, working with Blanchie Dessert, MD, by Stephania Fragmin, ED Scribe. This patient was seen in room WTR7/WTR7 and the patient's care was started at 5:37 PM.    Chief Complaint  Patient presents with  . Fall  . Ankle Injury   Patient is a 44 y.o. female presenting with ankle pain. The history is provided by the patient. No language interpreter was used.  Ankle Pain Location:  Ankle and foot Time since incident:  2 hours Injury: yes   Mechanism of injury: fall   Fall:    Fall occurred:  Standing   Height of fall:  Standing   Impact surface:  Dirt   Point of impact:  Buttocks   Entrapped after fall: no   Ankle location:  L ankle Foot location:  Dorsum of L foot Pain details:    Quality:  Throbbing   Radiates to:  Does not radiate   Severity:  Moderate (8/10)   Onset quality:  Sudden   Duration:  2 hours   Timing:  Constant   Progression:  Worsening Chronicity:  New Dislocation: no   Foreign body present:  No foreign bodies Prior injury to area:  No Relieved by:  Elevation Worsened by:  Bearing weight (walking, weight bearing, sitting in certain positions, and moving her right toes) Ineffective treatments:  None tried Associated symptoms: swelling   Associated symptoms: no back pain, no fever, no muscle weakness, no neck pain, no numbness and no tingling   Risk factors: no obesity      HPI Comments: [name] is a 44 y.o. female with a PMHx of COPD, asthma, and recurrent kidney infections, who presents to the Emergency Department complaining of constant, 8/10, throbbing, non-radiating left lateral foot/ankle pain that began 1.5 hours ago when she stepped into a hole causing her to fall and twist her ankle. She denies head injury or LOC during fall. She complains of associated bruising and swelling since the injury, noted to the dorsum  of lateral foot. Elevating it provides mild relief. Patient has not tried any prior treatment/medications for this. Walking, weight-bearing, and moving her toes. She denies numbness, tingling, weakness, knee pain, hip pain, back pain, neck pain, HA, CP, SOB, abdominal pain, nausea, vomiting.   Past Medical History  Diagnosis Date  . Asthma   . Renal disorder     kidney infections  . COPD (chronic obstructive pulmonary disease)   . Irregular heart rate    Past Surgical History  Procedure Laterality Date  . Appendectomy    . Tubal ligation     Family History  Problem Relation Age of Onset  . Hypertension Mother   . Hypertension Father    History  Substance Use Topics  . Smoking status: Current Every Day Smoker -- 1.50 packs/day    Types: Cigarettes  . Smokeless tobacco: Never Used  . Alcohol Use: Yes     Comment: rare   OB History    No data available     Review of Systems  Constitutional: Negative for fever and chills.  HENT: Negative for facial swelling.   Respiratory: Negative for shortness of breath.   Cardiovascular: Negative for chest pain.  Gastrointestinal: Negative for nausea, vomiting and abdominal pain.  Musculoskeletal: Positive for joint swelling (L foot) and arthralgias (L foot). Negative for back pain and neck pain.  Skin: Positive  for color change (bruising to L foot).  Allergic/Immunologic: Negative for immunocompromised state.  Neurological: Negative for weakness, numbness and headaches.   10 Systems reviewed and are negative for acute change except as noted in the HPI.    Allergies  Bactrim  Home Medications   Prior to Admission medications   Medication Sig Start Date End Date Taking? Authorizing Provider  albuterol (PROVENTIL HFA;VENTOLIN HFA) 108 (90 BASE) MCG/ACT inhaler Inhale 2 puffs into the lungs every 6 (six) hours as needed for wheezing or shortness of breath.     Historical Provider, MD  diazepam (VALIUM) 5 MG tablet Take 1 tablet (5  mg total) by mouth 2 (two) times daily. 05/29/14   Antonietta Breach, PA-C  diazepam (VALIUM) 5 MG tablet Take 1 tablet (5 mg total) by mouth 2 (two) times daily. 05/31/14   Nicole Pisciotta, PA-C  diphenhydramine-acetaminophen (TYLENOL PM) 25-500 MG TABS Take 1 tablet by mouth at bedtime as needed (sleep/pain).    Historical Provider, MD  HYDROcodone-acetaminophen (NORCO/VICODIN) 5-325 MG per tablet Take 1-2 tablets by mouth every 6 (six) hours as needed. 01/08/15   Glendell Docker, NP  ibuprofen (ADVIL,MOTRIN) 200 MG tablet Take 400 mg by mouth 2 (two) times daily as needed (pain).    Historical Provider, MD  oxyCODONE-acetaminophen (PERCOCET/ROXICET) 5-325 MG per tablet Take 1-2 tablets by mouth every 6 (six) hours as needed for moderate pain or severe pain. 05/29/14   Antonietta Breach, PA-C  oxyCODONE-acetaminophen (PERCOCET/ROXICET) 5-325 MG per tablet 1 to 2 tabs PO q6hrs  PRN for pain 05/31/14   Elmyra Ricks Pisciotta, PA-C  predniSONE (DELTASONE) 20 MG tablet 3 tabs po daily x 3 days, then 2 tabs x 3 days, then 1.5 tabs x 3 days, then 1 tab x 3 days, then 0.5 tabs x 3 days 05/31/14   Elmyra Ricks Pisciotta, PA-C   BP 113/74 mmHg  Pulse 111  Temp(Src) 98.7 F (37.1 C) (Oral)  Resp 20  SpO2 96% Physical Exam  Constitutional: She is oriented to person, place, and time. She appears well-developed and well-nourished.  Non-toxic appearance. She appears distressed (in pain).  Afebrile, nontoxic, appears uncomfortable in pain, mildly tachycardic likely from pain  HENT:  Head: Normocephalic and atraumatic.  Mouth/Throat: Mucous membranes are normal.  Eyes: Conjunctivae and EOM are normal. Right eye exhibits no discharge. Left eye exhibits no discharge.  Neck: Normal range of motion. Neck supple.  Cardiovascular: Intact distal pulses.  Tachycardia present.   Pedal pulses intact. Tachycardia noted, likely from pain  Pulmonary/Chest: Effort normal. No respiratory distress.  Abdominal: Normal appearance. She exhibits no  distension.  Musculoskeletal: Normal range of motion.       Left ankle: Normal.       Left foot: There is tenderness, bony tenderness and swelling. There is normal range of motion, normal capillary refill and no deformity.       Feet:  L foot with hematoma to base of 4th-5th metatarsals, with TTP in this region, no crepitus or deformity, FROM intact in all digits and in ankle. No ankle TTP. Sensation grossly intact, strength slightly diminished with dorsiflexion 2/2 pain but still able to complete movements. Gait not assessed due to pain. Distal pulses intact bilaterally.  Neurological: She is alert and oriented to person, place, and time. She has normal strength. No sensory deficit.  Skin: Skin is warm, dry and intact. Bruising noted. No rash noted.  Bruise to L foot No abrasions or wounds over exposed surfaces  Psychiatric: She has a normal  mood and affect. Her behavior is normal.  Nursing note and vitals reviewed.   ED Course  Procedures (including critical care time)  DIAGNOSTIC STUDIES: Oxygen Saturation is 96% on room air, normal by my interpretation.    COORDINATION OF CARE: 5:43 PM - Discussed treatment plan with pt at bedside which includes XR, and pt agreed to plan.   Imaging Review Dg Foot Complete Left  02/09/2015   CLINICAL DATA:  Tripped gain yd. Left foot injury and pain. Bruising. Initial encounter.  EXAM: LEFT FOOT - COMPLETE 3+ VIEW  COMPARISON:  None.  FINDINGS: There is no evidence of fracture or dislocation. There is no evidence of arthropathy or other focal bone abnormality. Soft tissues are unremarkable.  IMPRESSION: Negative.   Electronically Signed   By: Earle Gell M.D.   On: 02/09/2015 18:35    MDM   Final diagnoses:  Left foot pain  Fall, initial encounter  Traumatic hematoma of foot, left, initial encounter    44 y.o. female with L foot pain after stepping into hole 1hr PTA. Neurovascularly intact with soft compartments. Hematoma noted to foot with  TTP over base of 4th/5th metatarsal. Will obtain imaging. Will give pain meds. Of note, tachycardia noted, but likely somewhat related to pain, doubt DVT/PE given presentation of fall resulting in pain and no calf swelling/tenderness. Will recheck after pain meds given.   7:13 PM Xray neg. Will ace wrap, place in post op shoe, and give crutches for comfort. Discussed RICE therapy. Will have her f/up with ortho as needed for ongoing pain in 2wks. Will give pain meds. Tachycardia resolved. I explained the diagnosis and have given explicit precautions to return to the ER including for any other new or worsening symptoms. The patient understands and accepts the medical plan as it's been dictated and I have answered their questions. Discharge instructions concerning home care and prescriptions have been given. The patient is STABLE and is discharged to home in good condition.   I personally performed the services described in this documentation, which was scribed in my presence. The recorded information has been reviewed and is accurate.  BP 128/78 mmHg  Pulse 84  Temp(Src) 98.7 F (37.1 C) (Oral)  Resp 20  SpO2 94%  LMP 01/20/2015  Meds ordered this encounter  Medications  . HYDROcodone-acetaminophen (NORCO/VICODIN) 5-325 MG per tablet 1 tablet    Sig:   . HYDROcodone-acetaminophen (NORCO) 5-325 MG per tablet    Sig: Take 1 tablet by mouth every 6 (six) hours as needed for severe pain.    Dispense:  10 tablet    Refill:  0    Order Specific Question:  Supervising Provider    Answer:  Sabra Heck, BRIAN [[id number]]  . naproxen (NAPROSYN) 500 MG tablet    Sig: Take 1 tablet (500 mg total) by mouth 2 (two) times daily as needed for mild pain, moderate pain or headache (TAKE WITH MEALS.).    Dispense:  20 tablet    Refill:  0    Order Specific Question:  Supervising Provider    Answer:  Noemi Chapel [[id number]]       Lis Savitt Camprubi-Soms, PA-C 02/09/15 1914  Blanchie Dessert, MD 02/09/15 2342

## 2015-02-09 NOTE — ED Notes (Signed)
Fell into hole in yard, twisted ankle. Mild swelling, bruising evident, no obvious deformities

## 2015-02-09 NOTE — Discharge Instructions (Signed)
Ace wrap the foot and use the post op shoe for comfort. Use crutches as needed for comfort. Ice and elevate foot throughout the day. Alternate between naprosyn and norco for pain relief. Do not drive or operate machinery with pain medication use. Call orthopedic follow up today or tomorrow to schedule followup appointment for recheck of ongoing ankle pain in 1-2 weeks that can be canceled with a 24-48 hour notice if complete resolution of pain. Return to the ER for changes or worsening symptoms.    Foot Contusion  A foot contusion is a deep bruise to the foot. Contusions happen when an injury causes bleeding under the skin. Signs of bruising include pain, puffiness (swelling), and discolored skin. The contusion may turn blue, purple, or yellow. HOME CARE  Put ice on the injured area.  Put ice in a plastic bag.  Place a towel between your skin and the bag.  Leave the ice on for 15-20 minutes, 03-04 times a day.  Only take medicines as told by your doctor.  Use an elastic wrap only as told. You may remove the wrap for sleeping, showering, and bathing. Take the wrap off if you lose feeling (numb) in your toes, or they turn blue or cold. Put the wrap on more loosely.  Keep the foot raised (elevated) with pillows.  If your foot hurts, avoid standing or walking.  When your doctor says it is okay to use your foot, start using it slowly. If you have pain, lessen how much you use your foot.  See your doctor as told. GET HELP RIGHT AWAY IF:   You have more redness, puffiness, or pain in your foot.  Your puffiness or pain does not get better with medicine.  You lose feeling in your foot, or you cannot move your toes.  Your foot turns cold or blue.  You have pain when you move your toes.  Your foot feels warm.  Your contusion does not get better in 2 days. MAKE SURE YOU:   Understand these instructions.  Will watch this condition.  Will get help right away if you or your child is  not doing well or gets worse. Document Released: 08/20/2008 Document Revised: 05/12/2012 Document Reviewed: 10/15/2011 [facility] Patient Information 2015 [place], [place]. This information is not intended to replace advice given to you by your health care provider. Make sure you discuss any questions you have with your health care provider.  Cryotherapy Cryotherapy is when you put ice on your injury. Ice helps lessen pain and puffiness (swelling) after an injury. Ice works the best when you start using it in the first 24 to 48 hours after an injury. HOME CARE  Put a dry or damp towel between the ice pack and your skin.  You may press gently on the ice pack.  Leave the ice on for no more than 10 to 20 minutes at a time.  Check your skin after 5 minutes to make sure your skin is okay.  Rest at least 20 minutes between ice pack uses.  Stop using ice when your skin loses feeling (numbness).  Do not use ice on someone who cannot tell you when it hurts. This includes small children and people with memory problems (dementia). GET HELP RIGHT AWAY IF:  You have white spots on your skin.  Your skin turns blue or pale.  Your skin feels waxy or hard.  Your puffiness gets worse. MAKE SURE YOU:   Understand these instructions.  Will watch your  condition.  Will get help right away if you are not doing well or get worse. Document Released: 04/29/2008 Document Revised: 02/03/2012 Document Reviewed: 07/04/2011 [facility] Patient Information 2015 [place], [place]. This information is not intended to replace advice given to you by your health care provider. Make sure you discuss any questions you have with your health care provider.

## 2015-04-19 ENCOUNTER — Encounter (HOSPITAL_COMMUNITY): Payer: Self-pay

## 2015-04-19 ENCOUNTER — Emergency Department (HOSPITAL_COMMUNITY)
Admission: EM | Admit: 2015-04-19 | Discharge: 2015-04-19 | Disposition: A | Discharge status: Home / Self Care | Payer: Medicaid Other | Attending: Emergency Medicine | Admitting: Emergency Medicine

## 2015-04-19 DIAGNOSIS — S61011A Laceration without foreign body of right thumb without damage to nail, initial encounter: Secondary | ICD-10-CM

## 2015-04-19 DIAGNOSIS — Y929 Unspecified place or not applicable: Secondary | ICD-10-CM | POA: Diagnosis not present

## 2015-04-19 DIAGNOSIS — Z23 Encounter for immunization: Secondary | ICD-10-CM | POA: Diagnosis not present

## 2015-04-19 DIAGNOSIS — Z79899 Other long term (current) drug therapy: Secondary | ICD-10-CM | POA: Insufficient documentation

## 2015-04-19 DIAGNOSIS — Z8679 Personal history of other diseases of the circulatory system: Secondary | ICD-10-CM | POA: Insufficient documentation

## 2015-04-19 DIAGNOSIS — Z720 Tobacco use: Secondary | ICD-10-CM | POA: Insufficient documentation

## 2015-04-19 DIAGNOSIS — Z87448 Personal history of other diseases of urinary system: Secondary | ICD-10-CM | POA: Diagnosis not present

## 2015-04-19 DIAGNOSIS — Y998 Other external cause status: Secondary | ICD-10-CM | POA: Diagnosis not present

## 2015-04-19 DIAGNOSIS — F419 Anxiety disorder, unspecified: Secondary | ICD-10-CM | POA: Insufficient documentation

## 2015-04-19 DIAGNOSIS — W260XXA Contact with knife, initial encounter: Secondary | ICD-10-CM | POA: Insufficient documentation

## 2015-04-19 DIAGNOSIS — Y9389 Activity, other specified: Secondary | ICD-10-CM | POA: Diagnosis not present

## 2015-04-19 DIAGNOSIS — J449 Chronic obstructive pulmonary disease, unspecified: Secondary | ICD-10-CM | POA: Diagnosis not present

## 2015-04-19 MED ORDER — TETANUS-DIPHTH-ACELL PERTUSSIS 5-2.5-18.5 LF-MCG/0.5 IM SUSP
0.5000 mL | Freq: Once | INTRAMUSCULAR | Status: AC
  Administered 2015-04-19: 0.5 mL via INTRAMUSCULAR
  Filled 2015-04-19: qty 0.5

## 2015-04-19 MED ORDER — ACETAMINOPHEN 325 MG PO TABS
650.0000 mg | ORAL_TABLET | Freq: Once | ORAL | Status: AC
  Administered 2015-04-19: 650 mg via ORAL
  Filled 2015-04-19: qty 2

## 2015-04-19 NOTE — Discharge Instructions (Signed)
Tissue Adhesive Wound Care Some cuts, wounds, lacerations, and incisions can be repaired by using tissue adhesive. Tissue adhesive is like glue. It holds the skin together, allowing for faster healing. It forms a strong bond on the skin in about 1 minute and reaches its full strength in about 2 or 3 minutes. The adhesive disappears naturally while the wound is healing. It is important to take proper care of your wound at home while it heals.  HOME CARE INSTRUCTIONS   Showers are allowed. Do not soak the area containing the tissue adhesive. Do not take baths, swim, or use hot tubs. Do not use any soaps or ointments on the wound. Certain ointments can weaken the glue.  If a bandage (dressing) has been applied, follow your health care provider's instructions for how often to change the dressing.   Keep the dressing dry if one has been applied.   Do not scratch, pick, or rub the adhesive.   Do not place tape over the adhesive. The adhesive could come off when pulling the tape off.   Protect the wound from further injury until it is healed.   Protect the wound from sun and tanning bed exposure while it is healing and for several weeks after healing.   Only take over-the-counter or prescription medicines as directed by your health care provider.   Keep all follow-up appointments as directed by your health care provider. SEEK IMMEDIATE MEDICAL CARE IF:   Your wound becomes red, swollen, hot, or tender.   You develop a rash after the glue is applied.  You have increasing pain in the wound.   You have a red streak that goes away from the wound.   You have pus coming from the wound.   You have increased bleeding.  You have a fever.  You have shaking chills.   You notice a bad smell coming from the wound.   Your wound or adhesive breaks open.  MAKE SURE YOU:   Understand these instructions.  Will watch your condition.  Will get help right away if you are not doing  well or get worse. Document Released: 05/07/2001 Document Revised: 09/01/2013 Document Reviewed: 06/02/2013 [facility] Patient Information 2015 [place], [place]. This information is not intended to replace advice given to you by your health care provider. Make sure you discuss any questions you have with your health care provider.

## 2015-04-19 NOTE — ED Provider Notes (Signed)
CSN: [id number]     Arrival date & time 04/19/15  1138 History   This chart was scribed for non-physician practitioner Waynetta Pean, PA-C, working with Dorie Rank, MD, by Thea Alken, ED Scribe. This patient was seen in room WTR8/WTR8 and the patient's care was started at 12:21 PM. Chief Complaint  Patient presents with  . Extremity Laceration   The history is provided by the patient. No language interpreter was used.   [name] is a 44 y.o. female who presents to the Emergency Department complaining of a laceration to right thumb that occurred 4 hours ago. Pt states she was at work cutting tomatoes when she cut the tip of right thumb. She now has 7/10, tingling and throbbing pain to right thumb. She denies numbness and weakness. She is unsure of last tetanus. Pt works at [employer].  Bleeding controlled.   Past Medical History  Diagnosis Date  . Asthma   . Renal disorder     kidney infections  . COPD (chronic obstructive pulmonary disease)   . Irregular heart rate    Past Surgical History  Procedure Laterality Date  . Appendectomy    . Tubal ligation     Family History  Problem Relation Age of Onset  . Hypertension Mother   . Hypertension Father    History  Substance Use Topics  . Smoking status: Current Every Day Smoker -- 1.50 packs/day    Types: Cigarettes  . Smokeless tobacco: Never Used  . Alcohol Use: Yes     Comment: rare   OB History    No data available     Review of Systems  Skin: Positive for wound.  Neurological: Negative for weakness and numbness.   Allergies  Bactrim  Home Medications   Prior to Admission medications   Medication Sig Start Date End Date Taking? Authorizing Provider  albuterol (PROVENTIL HFA;VENTOLIN HFA) 108 (90 BASE) MCG/ACT inhaler Inhale 2 puffs into the lungs every 6 (six) hours as needed for wheezing or shortness of breath.     Historical Provider, MD  diazepam (VALIUM) 5 MG tablet Take 1 tablet (5 mg total) by mouth 2 (two)  times daily. 05/29/14   Antonietta Breach, PA-C  diazepam (VALIUM) 5 MG tablet Take 1 tablet (5 mg total) by mouth 2 (two) times daily. 05/31/14   Nicole Pisciotta, PA-C  diphenhydramine-acetaminophen (TYLENOL PM) 25-500 MG TABS Take 1 tablet by mouth at bedtime as needed (sleep/pain).    Historical Provider, MD  HYDROcodone-acetaminophen (NORCO) 5-325 MG per tablet Take 1 tablet by mouth every 6 (six) hours as needed for severe pain. 02/09/15   Mercedes Camprubi-Soms, PA-C  HYDROcodone-acetaminophen (NORCO/VICODIN) 5-325 MG per tablet Take 1-2 tablets by mouth every 6 (six) hours as needed. 01/08/15   Glendell Docker, NP  ibuprofen (ADVIL,MOTRIN) 200 MG tablet Take 400 mg by mouth 2 (two) times daily as needed (pain).    Historical Provider, MD  naproxen (NAPROSYN) 500 MG tablet Take 1 tablet (500 mg total) by mouth 2 (two) times daily as needed for mild pain, moderate pain or headache (TAKE WITH MEALS.). 02/09/15   Mercedes Camprubi-Soms, PA-C  oxyCODONE-acetaminophen (PERCOCET/ROXICET) 5-325 MG per tablet Take 1-2 tablets by mouth every 6 (six) hours as needed for moderate pain or severe pain. 05/29/14   Antonietta Breach, PA-C  oxyCODONE-acetaminophen (PERCOCET/ROXICET) 5-325 MG per tablet 1 to 2 tabs PO q6hrs  PRN for pain 05/31/14   Elmyra Ricks Pisciotta, PA-C  predniSONE (DELTASONE) 20 MG tablet 3 tabs po daily  x 3 days, then 2 tabs x 3 days, then 1.5 tabs x 3 days, then 1 tab x 3 days, then 0.5 tabs x 3 days 05/31/14   Elmyra Ricks Pisciotta, PA-C   BP 115/86 mmHg  Pulse 124  Temp(Src) 98.3 F (36.8 C) (Oral)  Resp 16  SpO2 96%  LMP 04/15/2015 Physical Exam  Constitutional: She appears well-developed and well-nourished. No distress.  Pt appears anxious  HENT:  Head: Normocephalic and atraumatic.  Eyes: Right eye exhibits no discharge. Left eye exhibits no discharge.  Cardiovascular:  HR 96  Pulmonary/Chest: Effort normal. No respiratory distress.  Neurological: She is alert. Coordination normal.  No numbness or  weakness surrounding or distal to the wound.   Skin: Skin is warm and dry. No rash noted. She is not diaphoretic.  0.5 cm laceration to distal right thumb  Psychiatric: Her behavior is normal. Her mood appears anxious.  Patient appears slightly anxious.  Nursing note and vitals reviewed.   ED Course  LACERATION REPAIR Date/Time: 04/19/2015 12:45 PM Performed by: Waynetta Pean Authorized by: Waynetta Pean Consent: Verbal consent obtained. Risks and benefits: risks, benefits and alternatives were discussed Consent given by: patient Patient understanding: patient states understanding of the procedure being performed Patient consent: the patient's understanding of the procedure matches consent given Site marked: the operative site was marked Required items: required blood products, implants, devices, and special equipment available Patient identity confirmed: verbally with patient Time out: Immediately prior to procedure a "time out" was called to verify the correct patient, procedure, equipment, support staff and site/side marked as required. Body area: upper extremity Location details: right thumb Laceration length: 0.5 cm Foreign bodies: no foreign bodies Tendon involvement: none Nerve involvement: none Vascular damage: no Patient sedated: no Preparation: Patient was prepped and draped in the usual sterile fashion. Irrigation solution: saline Irrigation method: tap Amount of cleaning: standard Debridement: none Degree of undermining: none Skin closure: glue Approximation: close Approximation difficulty: simple Dressing: non-adhesive packing strip Patient tolerance: Patient tolerated the procedure well with no immediate complications     DIAGNOSTIC STUDIES: Oxygen Saturation is 96% on RA, normal by my interpretation.    COORDINATION OF CARE: 12:32 PM- Pt advised of plan for treatment and pt agrees. Labs Review Labs Reviewed - No data to display  Imaging Review No  results found.   EKG Interpretation None      Filed Vitals:   04/19/15 1142  BP: 115/86  Pulse: 124  Temp: 98.3 F (36.8 C)  TempSrc: Oral  Resp: 16  SpO2: 96%     MDM   Meds given in ED:  Medications  Tdap (BOOSTRIX) injection 0.5 mL (0.5 mLs Intramuscular Given 04/19/15 1254)  acetaminophen (TYLENOL) tablet 650 mg (650 mg Oral Given 04/19/15 1254)    New Prescriptions   No medications on file    Final diagnoses:  Thumb laceration, right, initial encounter   Patient presents from work with a small laceration to her right distal thumb tip. Bleeding is controlled. The laceration is approximately 0.5 cm. The laceration is well approximated. The laceration was cleaned and repaired with skin glue by me. Her tetanus was updated. Patient tolerated procedure well. Initially the patient was extremely anxious about needles. After I advised her I would be glue in her laceration her heart rate improved and was 96. Education provided on wound care. I advised the patient to follow-up with their primary care provider this week. I advised the patient to return to the emergency department with  new or worsening symptoms or new concerns. The patient verbalized understanding and agreement with plan.    I personally performed the services described in this documentation, which was scribed in my presence. The recorded information has been reviewed and is accurate.      Waynetta Pean, PA-C 04/19/15 1259  Dorie Rank, MD 04/20/15 [phone]

## 2015-04-19 NOTE — ED Notes (Addendum)
Pt cut rt thumb tip with knife at 8:15 am.  Bleeding controlled.  Doesn't think she is up to date on tetanus.

## 2015-04-19 NOTE — Progress Notes (Signed)
pcp is [facility] [address] [place], [place] [address] [phone]

## 2015-04-19 NOTE — ED Notes (Signed)
dermabond given to EDPA for administration

## 2015-05-08 ENCOUNTER — Encounter (HOSPITAL_COMMUNITY): Payer: Self-pay | Admitting: Emergency Medicine

## 2015-05-08 ENCOUNTER — Emergency Department (HOSPITAL_COMMUNITY)
Admission: EM | Admit: 2015-05-08 | Discharge: 2015-05-08 | Disposition: A | Discharge status: Home / Self Care | Payer: Medicaid Other | Attending: Emergency Medicine | Admitting: Emergency Medicine

## 2015-05-08 DIAGNOSIS — M542 Cervicalgia: Secondary | ICD-10-CM | POA: Diagnosis present

## 2015-05-08 DIAGNOSIS — M436 Torticollis: Secondary | ICD-10-CM

## 2015-05-08 DIAGNOSIS — Z8679 Personal history of other diseases of the circulatory system: Secondary | ICD-10-CM | POA: Insufficient documentation

## 2015-05-08 DIAGNOSIS — J449 Chronic obstructive pulmonary disease, unspecified: Secondary | ICD-10-CM | POA: Diagnosis not present

## 2015-05-08 DIAGNOSIS — Z87448 Personal history of other diseases of urinary system: Secondary | ICD-10-CM | POA: Insufficient documentation

## 2015-05-08 DIAGNOSIS — Z79899 Other long term (current) drug therapy: Secondary | ICD-10-CM | POA: Diagnosis not present

## 2015-05-08 DIAGNOSIS — Z720 Tobacco use: Secondary | ICD-10-CM | POA: Insufficient documentation

## 2015-05-08 MED ORDER — CYCLOBENZAPRINE HCL 10 MG PO TABS: 10.0000 mg | ORAL_TABLET | Freq: Every evening | ORAL | Status: DC | PRN

## 2015-05-08 NOTE — ED Provider Notes (Signed)
CSN: [id number]     Arrival date & time 05/08/15  1054 History   First MD Initiated Contact with Patient 05/08/15 1102     Chief Complaint  Patient presents with  . Neck Pain     (Consider location/radiation/quality/duration/timing/severity/associated sxs/prior Treatment) HPI  [name] is a 44 y.o. female presenting with right-sided neck pain when she woke up 3 days ago. Patient denies any injury. She works [employer] and lifting as well as trying turning to right side makes pain worse. Patient reports no improvement with Tylenol. She has been using a heating pad with some relief. She denies any numbness, tingling. No headache, visual changes. The pain does not radiate. No chest pain, SOB, difficulty breathing.   Past Medical History  Diagnosis Date  . Asthma   . Renal disorder     kidney infections  . COPD (chronic obstructive pulmonary disease)   . Irregular heart rate    Past Surgical History  Procedure Laterality Date  . Appendectomy    . Tubal ligation     Family History  Problem Relation Age of Onset  . Hypertension Mother   . Hypertension Father    History  Substance Use Topics  . Smoking status: Current Every Day Smoker -- 1.50 packs/day    Types: Cigarettes  . Smokeless tobacco: Never Used  . Alcohol Use: Yes     Comment: rare   OB History    No data available     Review of Systems  Constitutional: Negative for fever and chills.  Musculoskeletal: Positive for neck pain. Negative for gait problem.  Neurological: Negative for weakness, numbness and headaches.      Allergies  Bactrim  Home Medications   Prior to Admission medications   Medication Sig Start Date End Date Taking? Authorizing Provider  albuterol (PROVENTIL HFA;VENTOLIN HFA) 108 (90 BASE) MCG/ACT inhaler Inhale 2 puffs into the lungs every 6 (six) hours as needed for wheezing or shortness of breath.     Historical Provider, MD  cyclobenzaprine (FLEXERIL) 10 MG tablet Take 1 tablet (10  mg total) by mouth at bedtime as needed for muscle spasms. 05/08/15   Al Corpus, PA-C  diazepam (VALIUM) 5 MG tablet Take 1 tablet (5 mg total) by mouth 2 (two) times daily. 05/29/14   Antonietta Breach, PA-C  diazepam (VALIUM) 5 MG tablet Take 1 tablet (5 mg total) by mouth 2 (two) times daily. 05/31/14   Nicole Pisciotta, PA-C  diphenhydramine-acetaminophen (TYLENOL PM) 25-500 MG TABS Take 1 tablet by mouth at bedtime as needed (sleep/pain).    Historical Provider, MD  HYDROcodone-acetaminophen (NORCO) 5-325 MG per tablet Take 1 tablet by mouth every 6 (six) hours as needed for severe pain. 02/09/15   Mercedes Camprubi-Soms, PA-C  HYDROcodone-acetaminophen (NORCO/VICODIN) 5-325 MG per tablet Take 1-2 tablets by mouth every 6 (six) hours as needed. 01/08/15   Glendell Docker, NP  ibuprofen (ADVIL,MOTRIN) 200 MG tablet Take 400 mg by mouth 2 (two) times daily as needed (pain).    Historical Provider, MD  naproxen (NAPROSYN) 500 MG tablet Take 1 tablet (500 mg total) by mouth 2 (two) times daily as needed for mild pain, moderate pain or headache (TAKE WITH MEALS.). 02/09/15   Mercedes Camprubi-Soms, PA-C  oxyCODONE-acetaminophen (PERCOCET/ROXICET) 5-325 MG per tablet Take 1-2 tablets by mouth every 6 (six) hours as needed for moderate pain or severe pain. 05/29/14   Antonietta Breach, PA-C  oxyCODONE-acetaminophen (PERCOCET/ROXICET) 5-325 MG per tablet 1 to 2 tabs PO q6hrs  PRN  for pain 05/31/14   Elmyra Ricks Pisciotta, PA-C  predniSONE (DELTASONE) 20 MG tablet 3 tabs po daily x 3 days, then 2 tabs x 3 days, then 1.5 tabs x 3 days, then 1 tab x 3 days, then 0.5 tabs x 3 days 05/31/14   Elmyra Ricks Pisciotta, PA-C   BP 135/77 mmHg  Pulse 98  Temp(Src) 98 F (36.7 C) (Oral)  Resp 16  SpO2 96%  LMP 04/15/2015 Physical Exam  Constitutional: She appears well-developed and well-nourished. No distress.  HENT:  Head: Normocephalic and atraumatic.  Mouth/Throat: Oropharynx is clear and moist.  Eyes: Conjunctivae and EOM are  normal. Right eye exhibits no discharge. Left eye exhibits no discharge.  Neck:  No C-spine tenderness. Full range of motion of neck discomfort when turning to the right. Significant muscle hypertrophy to right posterior trapezius.  Cardiovascular: Normal rate and regular rhythm.   2+ radial pulses equal bilaterally.  Pulmonary/Chest: Effort normal and breath sounds normal. No respiratory distress. She has no wheezes.  Abdominal: Soft. Bowel sounds are normal. She exhibits no distension. There is no tenderness.  Neurological: She is alert. She exhibits normal muscle tone. Coordination normal.  Catheter 5 grip strength bilaterally. Sensation intact.  Skin: Skin is warm and dry. She is not diaphoretic.  Nursing note and vitals reviewed.   ED Course  Procedures (including critical care time) Labs Review Labs Reviewed - No data to display  Imaging Review No results found.   EKG Interpretation None      MDM   Final diagnoses:  Torticollis   Patient with torticollis. Neurovascularly intact. No C-spine tenderness. No injury. No indication for imaging. Patient with significant right neck muscle hypertrophy in distribution of trapezius. Discussed rice protocol as well as ibuprofen and Flexeril for severe pain at night. Driving and sedation precautions provided. Patient without PCP has been given resources to follow-up and establish care.  Discussed return precautions with patient. Discussed all results and patient verbalizes understanding and agrees with plan.    Al Corpus, PA-C 05/08/15 [place], MD 05/09/15 [phone]

## 2015-05-08 NOTE — Discharge Instructions (Signed)
Return to the emergency room with worsening of symptoms, new symptoms or with symptoms that are concerning, especially difficulty breathing, unable to open mouth fully, fevers, unable to move neck, severe headache, numbness, tingling, weakness. RICE: Rest, Ice (three cycles of 20 mins on, 65mns off at least twice a day), compression/brace, elevation. Heating pad works well for back pain. Ibuprofen '400mg'$  (2 tablets '200mg'$ ) every 5-6 hours for 3-5 days. Flexeril for severe pain. Do not operate machinery, drive or drink alcohol while taking narcotics or muscle relaxers. Follow up with PCP if symptoms worsen or are persistent.  Torticollis, Acute You have suddenly (acutely) developed a twisted neck (torticollis). This is usually a self-limited condition. CAUSES  Acute torticollis may be caused by malposition, trauma or infection. Most commonly, acute torticollis is caused by sleeping in an awkward position. Torticollis may also be caused by the flexion, extension or twisting of the neck muscles beyond their normal position. Sometimes, the exact cause may not be known. SYMPTOMS  Usually, there is pain and limited movement of the neck. Your neck may twist to one side. DIAGNOSIS  The diagnosis is often made by physical examination. X-rays, CT scans or MRIs may be done if there is a history of trauma or concern of infection. TREATMENT  For a common, stiff neck that develops during sleep, treatment is focused on relaxing the contracted neck muscle. Medications (including shots) may be used to treat the problem. Most cases resolve in several days. Torticollis usually responds to conservative physical therapy. If left untreated, the shortened and spastic neck muscle can cause deformities in the face and neck. Rarely, surgery is required. HOME CARE INSTRUCTIONS   Use over-the-counter and prescription medications as directed by your caregiver.  Do stretching exercises and massage the neck as directed by your  caregiver.  Follow up with physical therapy if needed and as directed by your caregiver. SEEK IMMEDIATE MEDICAL CARE IF:   You develop difficulty breathing or noisy breathing (stridor).  You drool, develop trouble swallowing or have pain with swallowing.  You develop numbness or weakness in the hands or feet.  You have changes in speech or vision.  You have problems with urination or bowel movements.  You have difficulty walking.  You have a fever.  You have increased pain. MAKE SURE YOU:   Understand these instructions.  Will watch your condition.  Will get help right away if you are not doing well or get worse. Document Released: 11/08/2000 Document Revised: 02/03/2012 Document Reviewed: 12/20/2009 [place] [facility] HealthPatient Information 2015 EFerdinand LMaine This information is not intended to replace advice given to you by your health care provider. Make sure you discuss any questions you have with your health care provider.   Emergency Department Resource Guide 1) Find a Doctor and Pay Out of Pocket Although you won't have to find out who is covered by your insurance plan, it is a good idea to ask around and get recommendations. You will then need to call the office and see if the doctor you have chosen will accept you as a new patient and what types of options they offer for patients who are self-pay. Some doctors offer discounts or will set up payment plans for their patients who do not have insurance, but you will need to ask so you aren't surprised when you get to your appointment.  2) Contact Your Local Health Department Not all health departments have doctors that can see patients for sick visits, but many do, so it is worth  a call to see if yours does. If you don't know where your local health department is, you can check in your phone book. The CDC also has a tool to help you locate your state's health department, and many state websites also have listings of all of their local  health departments.  3) Find a [facility] If your illness is not likely to be very severe or complicated, you may want to try a walk in clinic. These are popping up all over the country in pharmacies, drugstores, and shopping centers. They're usually staffed by nurse practitioners or physician assistants that have been trained to treat common illnesses and complaints. They're usually fairly quick and inexpensive. However, if you have serious medical issues or chronic medical problems, these are probably not your best option.  No Primary Care Doctor: - Call Health Connect at  [phone] - they can help you locate a primary care doctor that  accepts your insurance, provides certain services, etc. - Physician Referral Service- [phone]  Chronic Pain Problems: Organization         Address  Phone   Notes  [facility]  [phone] Patients need to be referred by their primary care doctor.   Medication Assistance: Organization         Address  Phone   Notes  [facility] Medication [facility] [address] [place], [place] [address] [phone] --Must be a resident of [facility] -- Must have NO insurance coverage whatsoever (no Medicaid/ Medicare, etc.) -- The pt. MUST have a primary care doctor that directs their care regularly and follows them in the community   MedAssist  [phone]   [employer]  [phone]    Agencies that provide inexpensive medical care: Organization         Address  Phone   Notes  [place]  [phone]   [facility]    [phone]   [facility] [place], [place] [address] [phone]   [place] [address] [place] [phone]   Planned Parenthood    [phone]   [facility]    [phone]   [place] and [place] [address], [place] Phone:   [phone], Fax:  [phone] Hours of Operation:  9 am - 6 pm, M-F.  Also accepts Medicaid/Medicare and self-pay.  [facility] for [place] [address], [address], [place] Phone: [phone], Fax: [phone]. Hours of Operation:  8:30 am - 5:30 pm, M-F.  Also accepts Medicaid and self-pay.  [facility] [place] [address], [place] Phone: [phone]   [place], [place], [place] [phone], Ext. [phone] Mondays & Thursdays: 7-9 AM.  First 15 patients are seen on a first come, first serve basis.    [place] Providers:  Organization         Address  Phone   Notes  [facility] [address], [place] [phone] Also accepts self-pay patients.  [place], [place]  [phone]   [place], [address], [place] [phone]   [facility] [address] [place] [phone]   Lucianne Lei [place], [place]  7, [place]   [phone] Only accepts [place] patients after they have their name applied to their card.   Self-Pay (no insurance) in [facility]:  Organization         Address  Phone   Notes  Sickle Cell Patients, [facility] [place] [phone]   [facility] [place] [phone]   [facility] [place]  [place], [place], [place] [phone]   [facility]/Dr. Osei-Bonsu  [address], [place] or [address], [place] [phone] Phone number for both [place] and [place] locations is the same.  [facility] [address] [place] [phone]   [facility] [address], [place] or [address] [phone]   [facility] [place] [address] [place] [phone], phone; [phone], fax Sees patients 1st and 3rd Saturday of every month.  Must not qualify for public or private insurance (i.e. Medicaid, Medicare, [place] Health Choice, Veterans' Benefits)  Household income should be no more than 200% of the poverty level The clinic cannot treat you if you are pregnant or think you are pregnant  Sexually transmitted diseases are not treated at the clinic.    Dental Care: Organization         Address  Phone  Notes  [facility] [place] [phone] Accepts children up to age 58 who are enrolled in [place] or [place]; pregnant women with a Medicaid card; and children who have applied for Medicaid or [place] Health Choice, but were declined, whose parents can pay a reduced fee at time of service.  [facility] Department of [facility]  [address], [place] [phone] Accepts children up to age 59 who are enrolled in [place] or [place]; pregnant women with a Medicaid card; and children who have applied for Medicaid or [place] Health Choice, but were declined, whose parents can pay a reduced fee at time of service.  [place] Adult Dental Access PROGRAM  [place] [phone] Patients are seen by appointment only. Walk-ins are not accepted. [place] will see patients 84 years of age and older. Monday - Tuesday (8am-5pm) Most Wednesdays (8:30-5pm) $30 per visit, cash only  [facility] Adult Dental Access PROGRAM  [address], [facility] [phone] Patients are seen by appointment only. Walk-ins are not accepted. [place] will see patients 83 years of age and older. One Wednesday Evening (Monthly: Volunteer Based).  $30 per visit, cash only  [place]  [phone] for adults;  Children under age 71, call Graduate Pediatric Dentistry at [phone]. Children aged 2-14, please call [phone] to request a pediatric application.  Dental services are provided in all areas of dental care including fillings, crowns and bridges, complete and partial dentures, implants, gum treatment, root canals, and extractions. Preventive care is also provided. Treatment is provided to both adults and children. Patients are selected via a lottery and there is often a waiting list.   [facility] [address] [place]  [phone] [email or web address]   [facility] [address] [place], [place] [phone], Ext. [phone] Second and Fourth Thursday of each  month, opens at 6:30 AM; Clinic ends at 9 AM.  Patients are seen on a first-come first-served basis, and a limited number are seen during each clinic.   [facility]  [address] Hillard Danker [place], [place] [phone]   Eligibility Requirements You must have lived in [place], [place], or [place] counties for at least the last three months.   You cannot be eligible for state or federal sponsored [employer], including [employer], [place], or [employer].   You generally cannot be eligible for healthcare insurance through your employer.    How to apply: Eligibility screenings are held every Tuesday and Wednesday afternoon from 1:00 pm until 4:00 pm. You do not need an appointment for the interview!  [facility] [address], [place], [place]   [place]  [place] Department  [place]  [phone]    Behavioral Health Resources in the Community: Intensive Outpatient Programs Organization         Address  Phone  Notes  [place] Maynard. [address], [place], [place] [phone]   [facility] Outpatient [address] [place], [place]   ADS: Alcohol & Drug Svcs [address] [place], [place]   [place] [address]  [place], [place] or [phone]   Substance Abuse Resources Organization         Address  Phone  Notes  Alcohol and Drug Services  [phone]   [place]  [phone]   The [place], [place]   Chinita Pester  [phone]   Residential & Outpatient Substance Abuse Program  [phone]   Psychological Services Organization         Address  Phone  Notes  [facility] [place]  [place]  [phone]   [place] [address] [place] or [phone]    Mobile Crisis Teams Organization         Address  Phone  Notes  [facility], Mobile Crisis Care Unit  [phone]   [facility]  [address] [place], [place]   [place] [address], [place] [phone]    Self-Help/Support Groups Organization         Address  Phone             Notes  [place]. of [place] - variety of support groups  [place] Call for more information  Narcotics Anonymous (NA), [facility] [address], [employer] [place]  2 meetings at this location   Special educational needs teacher         Address  Phone  Notes  ASAP Residential Treatment [place],    [place]  [phone]   [facility]  [address] [place] [id number], [place], [place]   [place], [place] [phone] Admissions: 8am-3pm M-F  Incentives Substance [place] [address],    [place], [place] [phone]   [facility] [address] Jadene Pierini [place], [place]   [facility] [address]  [place], [place]   [facility]ight Programs - Intensive  Outpatient [address] Kristeen [address], [place], [place]   [facility] [address] [place].,  [place],  [place] [phone] or [phone]   [facility] Services (RTS) [address] [place], [place] Accepts Medicaid  [facility] [place] [address],  [place] [phone] Substance Abuse/Addiction Treatment   [facility] Organization         Address  Phone  Notes  [facility]  [phone]   Domenic Schwab, PhD [address] Arlis Porta [place], [place]   [phone] or [phone]   [place], [place] [phone]   [place], [place], [place] [phone] Insurance/Medicaid/sponsorship through [facility] and Families [address] [place]                                    [place], [place] [phone] [place] [address], [place] [phone]    Dr. Adele Schilder  [phone]   [facility] Dept. 1) [address], [place] 2) [place] 3)  [place] [address], [place] [phone]  [phone]   [place] [phone] or [phone] (After Hours)

## 2015-05-08 NOTE — ED Notes (Signed)
Pt c/o right sided neck pain that started Friday states pain has not eased up. Hurts with movement of neck.

## 2015-06-20 ENCOUNTER — Encounter (HOSPITAL_COMMUNITY): Payer: Self-pay | Admitting: *Deleted

## 2015-06-20 ENCOUNTER — Emergency Department (HOSPITAL_COMMUNITY)
Admission: EM | Admit: 2015-06-20 | Discharge: 2015-06-20 | Disposition: A | Discharge status: Home / Self Care | Payer: Medicaid Other | Attending: Emergency Medicine | Admitting: Emergency Medicine

## 2015-06-20 DIAGNOSIS — Z79899 Other long term (current) drug therapy: Secondary | ICD-10-CM | POA: Insufficient documentation

## 2015-06-20 DIAGNOSIS — R51 Headache: Secondary | ICD-10-CM | POA: Diagnosis not present

## 2015-06-20 DIAGNOSIS — M542 Cervicalgia: Secondary | ICD-10-CM | POA: Insufficient documentation

## 2015-06-20 DIAGNOSIS — Z87448 Personal history of other diseases of urinary system: Secondary | ICD-10-CM | POA: Insufficient documentation

## 2015-06-20 DIAGNOSIS — Z720 Tobacco use: Secondary | ICD-10-CM | POA: Diagnosis not present

## 2015-06-20 DIAGNOSIS — Z8679 Personal history of other diseases of the circulatory system: Secondary | ICD-10-CM | POA: Insufficient documentation

## 2015-06-20 DIAGNOSIS — J449 Chronic obstructive pulmonary disease, unspecified: Secondary | ICD-10-CM | POA: Diagnosis not present

## 2015-06-20 MED ORDER — DIAZEPAM 5 MG PO TABS: 5.0000 mg | ORAL_TABLET | Freq: Two times a day (BID) | ORAL | Status: AC

## 2015-06-20 MED ORDER — TRAMADOL HCL 50 MG PO TABS: 50.0000 mg | ORAL_TABLET | Freq: Four times a day (QID) | ORAL | Status: DC | PRN

## 2015-06-20 MED ORDER — KETOROLAC TROMETHAMINE 30 MG/ML IJ SOLN
30.0000 mg | Freq: Once | INTRAMUSCULAR | Status: AC
  Administered 2015-06-20: 30 mg via INTRAMUSCULAR
  Filled 2015-06-20: qty 1

## 2015-06-20 MED ORDER — TRAMADOL HCL 50 MG PO TABS
50.0000 mg | ORAL_TABLET | Freq: Once | ORAL | Status: AC
  Administered 2015-06-20: 50 mg via ORAL
  Filled 2015-06-20: qty 1

## 2015-06-20 MED ORDER — IBUPROFEN 200 MG PO TABS: 600.0000 mg | ORAL_TABLET | Freq: Three times a day (TID) | ORAL | Status: AC

## 2015-06-20 MED ORDER — DIAZEPAM 5 MG PO TABS
5.0000 mg | ORAL_TABLET | Freq: Once | ORAL | Status: AC
  Administered 2015-06-20: 5 mg via ORAL
  Filled 2015-06-20: qty 1

## 2015-06-20 NOTE — ED Provider Notes (Signed)
CSN: [id number]     Arrival date & time 06/20/15  1804 History   First MD Initiated Contact with Patient 06/20/15 2148     Chief Complaint  Patient presents with  . Headache  . Neck Pain     (Consider location/radiation/quality/duration/timing/severity/associated sxs/prior Treatment) HPI Patient is also concerned of neck pain. Symptoms began 4 days ago, soon after awakening. Since onset symptoms of been persistent. Symptoms are not improved with Tylenol, rest. Pain is focally the right paraspinal superior area, nonradiating. There is no new right upper extremity dysesthesia or weakness, no other new head or neck pain, no visual changes, vomiting, diarrhea, fever, chills.  Past Medical History  Diagnosis Date  . Asthma   . Renal disorder     kidney infections  . COPD (chronic obstructive pulmonary disease)   . Irregular heart rate    Past Surgical History  Procedure Laterality Date  . Appendectomy    . Tubal ligation     Family History  Problem Relation Age of Onset  . Hypertension Mother   . Hypertension Father    History  Substance Use Topics  . Smoking status: Current Every Day Smoker -- 1.50 packs/day    Types: Cigarettes  . Smokeless tobacco: Never Used  . Alcohol Use: Yes     Comment: rare   OB History    No data available     Review of Systems  Constitutional:       Per HPI, otherwise negative  HENT:       Per HPI, otherwise negative  Respiratory:       Per HPI, otherwise negative  Cardiovascular:       Per HPI, otherwise negative  Gastrointestinal: Negative for vomiting.  Endocrine:       Negative aside from HPI  Genitourinary:       Neg aside from HPI   Musculoskeletal:       Per HPI, otherwise negative  Skin: Negative.   Neurological: Positive for headaches. Negative for syncope and weakness.      Allergies  Bactrim  Home Medications   Prior to Admission medications   Medication Sig Start Date End Date Taking? Authorizing Provider    albuterol (PROVENTIL HFA;VENTOLIN HFA) 108 (90 BASE) MCG/ACT inhaler Inhale 2 puffs into the lungs every 6 (six) hours as needed for wheezing or shortness of breath.    Yes Historical Provider, MD  diazepam (VALIUM) 5 MG tablet Take 1 tablet (5 mg total) by mouth 2 (two) times daily. 05/31/14  Yes Nicole Pisciotta, PA-C  diphenhydramine-acetaminophen (TYLENOL PM) 25-500 MG TABS Take 1 tablet by mouth at bedtime as needed (sleep/pain).   Yes Historical Provider, MD  ibuprofen (ADVIL,MOTRIN) 200 MG tablet Take 400 mg by mouth 2 (two) times daily as needed (pain).   Yes Historical Provider, MD   BP 131/89 mmHg  Pulse 88  Temp(Src) 98.1 F (36.7 C) (Oral)  Resp 18  Ht '5\' 8"'$  (1.727 m)  SpO2 100% Physical Exam  Constitutional: She is oriented to person, place, and time. She appears well-developed and well-nourished. No distress.  HENT:  Head: Normocephalic and atraumatic.  Eyes: Conjunctivae and EOM are normal.  Neck:    Cardiovascular: Normal rate and regular rhythm.   Pulmonary/Chest: Effort normal and breath sounds normal. No stridor. No respiratory distress.  Abdominal: She exhibits no distension.  Musculoskeletal: She exhibits no edema.  Neurological: She is alert and oriented to person, place, and time. No cranial nerve deficit.  Skin: Skin is  warm and dry.  Psychiatric: She has a normal mood and affect.  Nursing note and vitals reviewed.   ED Course  Procedures (including critical care time) Chart review demonstratesmultiple recent visits, no recent episodes of similar neck pain.   11:24 PM Patient notes substantial reduction in pain, no new complaints.  MDM  Patient presents with persistent right paraspinal neck discomfort. Patient has no new neurovascular compromise, no evidence for distress, though she is extremely uncomfortable with palpation of the neck. No trauma. Patient presents essentially with Valium, Toradol, ice, tramadol.  D/C home w PMD F/U.  Carmin Muskrat, MD 06/20/15 2325

## 2015-06-20 NOTE — ED Notes (Signed)
Pt c/o neck pain since thursday. Pt thought that she had slept wrong but it has not gotten any better. States that she has taken tylenol with no relief. States that it is worse at night. States that she is now starting to get headaches.

## 2015-06-20 NOTE — Discharge Instructions (Signed)
As discussed, your pain is likely due to strain of the soft tissue in the back of your neck.  It is important to take all medication as directed, use ice packs, 4 times daily for the next 3 days, and do not hesitate to return here if you develop new, or concerning changes in your condition.

## 2015-06-25 ENCOUNTER — Encounter (HOSPITAL_COMMUNITY): Payer: Self-pay | Admitting: Emergency Medicine

## 2015-06-25 ENCOUNTER — Emergency Department (HOSPITAL_COMMUNITY)
Admission: EM | Admit: 2015-06-25 | Discharge: 2015-06-25 | Disposition: A | Discharge status: Home / Self Care | Payer: Medicaid Other | Attending: Emergency Medicine | Admitting: Emergency Medicine

## 2015-06-25 DIAGNOSIS — S61011A Laceration without foreign body of right thumb without damage to nail, initial encounter: Secondary | ICD-10-CM

## 2015-06-25 DIAGNOSIS — J449 Chronic obstructive pulmonary disease, unspecified: Secondary | ICD-10-CM | POA: Diagnosis not present

## 2015-06-25 DIAGNOSIS — Z720 Tobacco use: Secondary | ICD-10-CM | POA: Diagnosis not present

## 2015-06-25 DIAGNOSIS — Z79899 Other long term (current) drug therapy: Secondary | ICD-10-CM | POA: Diagnosis not present

## 2015-06-25 DIAGNOSIS — Y9389 Activity, other specified: Secondary | ICD-10-CM | POA: Insufficient documentation

## 2015-06-25 DIAGNOSIS — Y289XXA Contact with unspecified sharp object, undetermined intent, initial encounter: Secondary | ICD-10-CM | POA: Diagnosis not present

## 2015-06-25 DIAGNOSIS — Y990 Civilian activity done for income or pay: Secondary | ICD-10-CM | POA: Diagnosis not present

## 2015-06-25 DIAGNOSIS — S6991XA Unspecified injury of right wrist, hand and finger(s), initial encounter: Secondary | ICD-10-CM | POA: Diagnosis present

## 2015-06-25 DIAGNOSIS — Z87448 Personal history of other diseases of urinary system: Secondary | ICD-10-CM | POA: Diagnosis not present

## 2015-06-25 DIAGNOSIS — Y9289 Other specified places as the place of occurrence of the external cause: Secondary | ICD-10-CM | POA: Insufficient documentation

## 2015-06-25 DIAGNOSIS — Z8679 Personal history of other diseases of the circulatory system: Secondary | ICD-10-CM | POA: Diagnosis not present

## 2015-06-25 NOTE — ED Notes (Addendum)
Pt states she cut the tip of her right thumb while cutting tomatoes. Pt's thumb bleeding lightly in triage.

## 2015-06-25 NOTE — ED Provider Notes (Signed)
CSN: [id number]     Arrival date & time 06/25/15  1157 History  This chart was scribed for non-physician practitioner, Renold Genta, PA-C, working with Leonard Schwartz, MD, by Stephania Fragmin, ED Scribe. This patient was seen in room WTR7/WTR7 and the patient's care was started at 12:07 PM.    Chief Complaint  Patient presents with  . Extremity Laceration   The history is provided by the patient. No language interpreter was used.    HPI Comments: [name] is a 44 y.o. female who presents to the Emergency Department complaining of a laceration to the tip of her right thumb that occurred 2 hours ago, when patient was cutting tomatoes at work and accidentally caught her thumb. She states she tried to continue to work but bled through 4 bandages and 2 gloves. Patient complains of constant, 5/10, burning pain to the area she was cut. She states her tetanus vaccine is UTD.  Past Medical History  Diagnosis Date  . Asthma   . Renal disorder     kidney infections  . COPD (chronic obstructive pulmonary disease)   . Irregular heart rate    Past Surgical History  Procedure Laterality Date  . Appendectomy    . Tubal ligation     Family History  Problem Relation Age of Onset  . Hypertension Mother   . Hypertension Father    History  Substance Use Topics  . Smoking status: Current Every Day Smoker -- 1.50 packs/day    Types: Cigarettes  . Smokeless tobacco: Never Used  . Alcohol Use: Yes     Comment: rare   OB History    No data available     Review of Systems  Skin: Positive for wound.  Neurological: Negative for weakness and numbness.   Allergies  Bactrim  Home Medications   Prior to Admission medications   Medication Sig Start Date End Date Taking? Authorizing Provider  albuterol (PROVENTIL HFA;VENTOLIN HFA) 108 (90 BASE) MCG/ACT inhaler Inhale 2 puffs into the lungs every 6 (six) hours as needed for wheezing or shortness of breath.     Historical Provider, MD   traMADol (ULTRAM) 50 MG tablet Take 1 tablet (50 mg total) by mouth every 6 (six) hours as needed for severe pain. 06/20/15   Carmin Muskrat, MD   BP 120/81 mmHg  Pulse 124  Temp(Src) 98.8 F (37.1 C) (Oral)  Resp 20  SpO2 91% Physical Exam  Constitutional: She is oriented to person, place, and time. She appears well-developed and well-nourished. No distress.  HENT:  Head: Normocephalic and atraumatic.  Eyes: Conjunctivae and EOM are normal.  Neck: Neck supple. No tracheal deviation present.  Cardiovascular: Normal rate.   Pulmonary/Chest: Effort normal. No respiratory distress.  Musculoskeletal: Normal range of motion.  Neurological: She is alert and oriented to person, place, and time.  Skin: Skin is warm and dry.  tiny less than 1 cm skin avulsion to the tip of the right thumb with mild active bleeding.  Psychiatric: She has a normal mood and affect. Her behavior is normal.  Nursing note and vitals reviewed.   ED Course  Procedures (including critical care time)  DIAGNOSTIC STUDIES: Oxygen Saturation is 91% on RA, adequate by my interpretation.    COORDINATION OF CARE: 12:10 PM - Discussed treatment plan with pt at bedside which includes applying WoundSeal to stop the bleeding, and pt agreed to plan.  12:16 PM - WoundSeal applied to the laceration and dressing applied. Instructed pt to  keep dressing on until tomorrow morning, and expect scab formation.   MDM   Final diagnoses:  Laceration of right thumb, initial encounter   Tiny skin avulsion from tip of the right thumb, wound measuring less than 1 cm in diameter. Used one seal to stop the bleeding along with pressure and dressing. Patient will remove dressing tomorrow. Follow-up as needed.  Filed Vitals:   06/25/15 1202  BP: 120/81  Pulse: 124  Temp: 98.8 F (37.1 C)  TempSrc: Oral  Resp: 20  SpO2: 91%   I personally performed the services described in this documentation, which was scribed in my presence. The  recorded information has been reviewed and is accurate.   Jeannett Senior, PA-C 06/25/15 2022  Leonard Schwartz, MD 07/04/15 [phone]

## 2015-06-25 NOTE — Discharge Instructions (Signed)
Ibuprofen for pain. Keep dressing on until tomorrow. Scab should fall off on its own or wash it off under the water. Follow up as needed.   Laceration Care, Adult A laceration is a cut or lesion that goes through all layers of the skin and into the tissue just beneath the skin. TREATMENT  Some lacerations may not require closure. Some lacerations may not be able to be closed due to an increased risk of infection. It is important to see your caregiver as soon as possible after an injury to minimize the risk of infection and maximize the opportunity for successful closure. If closure is appropriate, pain medicines may be given, if needed. The wound will be cleaned to help prevent infection. Your caregiver will use stitches (sutures), staples, wound glue (adhesive), or skin adhesive strips to repair the laceration. These tools bring the skin edges together to allow for faster healing and a better cosmetic outcome. However, all wounds will heal with a scar. Once the wound has healed, scarring can be minimized by covering the wound with sunscreen during the day for 1 full year. HOME CARE INSTRUCTIONS  For sutures or staples:  Keep the wound clean and dry.  If you were given a bandage (dressing), you should change it at least once a day. Also, change the dressing if it becomes wet or dirty, or as directed by your caregiver.  Wash the wound with soap and water 2 times a day. Rinse the wound off with water to remove all soap. Pat the wound dry with a clean towel.  After cleaning, apply a thin layer of the antibiotic ointment as recommended by your caregiver. This will help prevent infection and keep the dressing from sticking.  You may shower as usual after the first 24 hours. Do not soak the wound in water until the sutures are removed.  Only take over-the-counter or prescription medicines for pain, discomfort, or fever as directed by your caregiver.  Get your sutures or staples removed as directed by  your caregiver. For skin adhesive strips:  Keep the wound clean and dry.  Do not get the skin adhesive strips wet. You may bathe carefully, using caution to keep the wound dry.  If the wound gets wet, pat it dry with a clean towel.  Skin adhesive strips will fall off on their own. You may trim the strips as the wound heals. Do not remove skin adhesive strips that are still stuck to the wound. They will fall off in time. For wound adhesive:  You may briefly wet your wound in the shower or bath. Do not soak or scrub the wound. Do not swim. Avoid periods of heavy perspiration until the skin adhesive has fallen off on its own. After showering or bathing, gently pat the wound dry with a clean towel.  Do not apply liquid medicine, cream medicine, or ointment medicine to your wound while the skin adhesive is in place. This may loosen the film before your wound is healed.  If a dressing is placed over the wound, be careful not to apply tape directly over the skin adhesive. This may cause the adhesive to be pulled off before the wound is healed.  Avoid prolonged exposure to sunlight or tanning lamps while the skin adhesive is in place. Exposure to ultraviolet light in the first year will darken the scar.  The skin adhesive will usually remain in place for 5 to 10 days, then naturally fall off the skin. Do not pick at  the adhesive film. You may need a tetanus shot if:  You cannot remember when you had your last tetanus shot.  You have never had a tetanus shot. If you get a tetanus shot, your arm may swell, get red, and feel warm to the touch. This is common and not a problem. If you need a tetanus shot and you choose not to have one, there is a rare chance of getting tetanus. Sickness from tetanus can be serious. SEEK MEDICAL CARE IF:   You have redness, swelling, or increasing pain in the wound.  You see a red line that goes away from the wound.  You have yellowish-white fluid (pus) coming  from the wound.  You have a fever.  You notice a bad smell coming from the wound or dressing.  Your wound breaks open before or after sutures have been removed.  You notice something coming out of the wound such as wood or glass.  Your wound is on your hand or foot and you cannot move a finger or toe. SEEK IMMEDIATE MEDICAL CARE IF:   Your pain is not controlled with prescribed medicine.  You have severe swelling around the wound causing pain and numbness or a change in color in your arm, hand, leg, or foot.  Your wound splits open and starts bleeding.  You have worsening numbness, weakness, or loss of function of any joint around or beyond the wound.  You develop painful lumps near the wound or on the skin anywhere on your body. MAKE SURE YOU:   Understand these instructions.  Will watch your condition.  Will get help right away if you are not doing well or get worse. Document Released: 11/11/2005 Document Revised: 02/03/2012 Document Reviewed: 05/07/2011 [facility] Patient Information 2015 [place], [place]. This information is not intended to replace advice given to you by your health care provider. Make sure you discuss any questions you have with your health care provider.

## 2015-07-09 ENCOUNTER — Encounter (HOSPITAL_COMMUNITY): Payer: Self-pay | Admitting: Emergency Medicine

## 2015-07-09 ENCOUNTER — Emergency Department (INDEPENDENT_AMBULATORY_CARE_PROVIDER_SITE_OTHER)
Admission: EM | Admit: 2015-07-09 | Discharge: 2015-07-09 | Disposition: A | Discharge status: Home / Self Care | Payer: Medicaid Other | Source: Home / Self Care | Attending: Emergency Medicine | Admitting: Emergency Medicine

## 2015-07-09 DIAGNOSIS — M62838 Other muscle spasm: Secondary | ICD-10-CM

## 2015-07-09 DIAGNOSIS — M6248 Contracture of muscle, other site: Secondary | ICD-10-CM | POA: Diagnosis not present

## 2015-07-09 MED ORDER — DIAZEPAM 5 MG PO TABS: 5.0000 mg | ORAL_TABLET | Freq: Two times a day (BID) | ORAL | Status: DC | PRN

## 2015-07-09 MED ORDER — HYDROCODONE-ACETAMINOPHEN 5-325 MG PO TABS: 1.0000 | ORAL_TABLET | Freq: Four times a day (QID) | ORAL | Status: DC | PRN

## 2015-07-09 MED ORDER — MELOXICAM 15 MG PO TABS: 15.0000 mg | ORAL_TABLET | Freq: Every day | ORAL | Status: DC

## 2015-07-09 NOTE — Discharge Instructions (Signed)
You have muscle spasms in your neck. Apply heat as often as you can. Take the Valium twice a day as needed for muscle spasms. Do not drive while taking this medicine. Take meloxicam daily to help with pain. Use the hydrocodone every 6 hours as needed for severe pain. Do not drive while taking this medicine. Follow-up as needed.

## 2015-07-09 NOTE — ED Notes (Signed)
C/o persistent neck pain onset 3 months ++ Seen at [facility] ED on 7/26 for similar sx; given Tramadol w/no relief Denies inj/trauma, weakness/numbness of extremities... Pain increases w/activity Alert, steady gait... No acute distress.

## 2015-07-09 NOTE — ED Provider Notes (Signed)
CSN: [id number]     Arrival date & time 07/09/15  1454 History   First MD Initiated Contact with Patient 07/09/15 1504     Chief Complaint  Patient presents with  . Neck Pain   (Consider location/radiation/quality/duration/timing/severity/associated sxs/prior Treatment) HPI  She is a 44 year old woman here for evaluation of left-sided neck pain. She states this started a few days ago. She reports soreness in the left neck and shoulder. It is worse with turning her head, but she is still able to do that. She reports similar pain in the past on the right side. She has been treated with multiple medications, but she said the one that helped her the most was the Valium. She states she would take the Valium at night and then ibuprofen during the day. She denies any injury or trauma. She does have a lot of stress currently. No radicular symptoms. No numbness, tingling, weakness in the upper extremities.  Past Medical History  Diagnosis Date  . Asthma   . Renal disorder     kidney infections  . COPD (chronic obstructive pulmonary disease)   . Irregular heart rate    Past Surgical History  Procedure Laterality Date  . Appendectomy    . Tubal ligation     Family History  Problem Relation Age of Onset  . Hypertension Mother   . Hypertension Father    Social History  Substance Use Topics  . Smoking status: Current Every Day Smoker -- 1.50 packs/day    Types: Cigarettes  . Smokeless tobacco: Never Used  . Alcohol Use: Yes     Comment: rare   OB History    No data available     Review of Systems As in history of present illness Allergies  Bactrim  Home Medications   Prior to Admission medications   Medication Sig Start Date End Date Taking? Authorizing Provider  acetaminophen (TYLENOL) 500 MG tablet Take 1,000 mg by mouth every 6 (six) hours as needed for moderate pain.    Historical Provider, MD  albuterol (PROVENTIL HFA;VENTOLIN HFA) 108 (90 BASE) MCG/ACT inhaler Inhale 2  puffs into the lungs every 6 (six) hours as needed for wheezing or shortness of breath.     Historical Provider, MD  diazepam (VALIUM) 5 MG tablet Take 1 tablet (5 mg total) by mouth every 12 (twelve) hours as needed for muscle spasms. 07/09/15   Melony Overly, MD  HYDROcodone-acetaminophen (NORCO) 5-325 MG per tablet Take 1 tablet by mouth every 6 (six) hours as needed for moderate pain. 07/09/15   Melony Overly, MD  meloxicam (MOBIC) 15 MG tablet Take 1 tablet (15 mg total) by mouth daily. 07/09/15   Melony Overly, MD  traMADol (ULTRAM) 50 MG tablet Take 1 tablet (50 mg total) by mouth every 6 (six) hours as needed for severe pain. 06/20/15   Carmin Muskrat, MD   BP 105/73 mmHg  Pulse 114  Temp(Src) 98.5 F (36.9 C) (Oral)  Resp 20  SpO2 95%  LMP 06/18/2015 Physical Exam  Constitutional: She is oriented to person, place, and time. She appears well-developed and well-nourished.  Neck: Normal range of motion. Neck supple.    5 out of 5 strength in bilateral upper extremities.  Cardiovascular:  Tachycardic  Pulmonary/Chest: Effort normal.  Neurological: She is alert and oriented to person, place, and time.    ED Course  Procedures (including critical care time) Labs Review Labs Reviewed - No data to display  Imaging Review No results  found.   MDM   1. Muscle spasms of neck    Treat with Valium and meloxicam. Prescription for Norco 5-325, #10 tablets given. Recommended frequent use of heat. Follow-up as needed.    Melony Overly, MD 07/09/15 [phone]

## 2015-09-03 ENCOUNTER — Encounter (HOSPITAL_COMMUNITY): Payer: Self-pay | Admitting: *Deleted

## 2015-09-03 ENCOUNTER — Emergency Department (INDEPENDENT_AMBULATORY_CARE_PROVIDER_SITE_OTHER): Payer: Medicaid Other

## 2015-09-03 ENCOUNTER — Emergency Department (HOSPITAL_COMMUNITY)
Admission: EM | Admit: 2015-09-03 | Discharge: 2015-09-03 | Disposition: A | Discharge status: Home / Self Care | Payer: Medicaid Other | Source: Home / Self Care | Attending: Emergency Medicine | Admitting: Emergency Medicine

## 2015-09-03 DIAGNOSIS — M542 Cervicalgia: Secondary | ICD-10-CM | POA: Diagnosis not present

## 2015-09-03 DIAGNOSIS — J441 Chronic obstructive pulmonary disease with (acute) exacerbation: Secondary | ICD-10-CM | POA: Diagnosis not present

## 2015-09-03 MED ORDER — HYDROCODONE-HOMATROPINE 5-1.5 MG/5ML PO SYRP: 5.0000 mL | ORAL_SOLUTION | Freq: Four times a day (QID) | ORAL | Status: DC | PRN

## 2015-09-03 MED ORDER — METHYLPREDNISOLONE ACETATE 80 MG/ML IJ SUSP
INTRAMUSCULAR | Status: AC
  Filled 2015-09-03: qty 1

## 2015-09-03 MED ORDER — PREDNISONE 50 MG PO TABS: ORAL_TABLET | ORAL | Status: DC

## 2015-09-03 MED ORDER — METHYLPREDNISOLONE ACETATE 80 MG/ML IJ SUSP
80.0000 mg | Freq: Once | INTRAMUSCULAR | Status: AC
  Administered 2015-09-03: 80 mg via INTRAMUSCULAR

## 2015-09-03 MED ORDER — IPRATROPIUM-ALBUTEROL 0.5-2.5 (3) MG/3ML IN SOLN
3.0000 mL | Freq: Once | RESPIRATORY_TRACT | Status: AC
  Administered 2015-09-03: 3 mL via RESPIRATORY_TRACT

## 2015-09-03 MED ORDER — ALBUTEROL SULFATE HFA 108 (90 BASE) MCG/ACT IN AERS: 2.0000 | INHALATION_SPRAY | Freq: Four times a day (QID) | RESPIRATORY_TRACT | Status: DC | PRN

## 2015-09-03 MED ORDER — IPRATROPIUM-ALBUTEROL 0.5-2.5 (3) MG/3ML IN SOLN
RESPIRATORY_TRACT | Status: AC
  Filled 2015-09-03: qty 3

## 2015-09-03 MED ORDER — DOXYCYCLINE HYCLATE 100 MG PO CAPS: 100.0000 mg | ORAL_CAPSULE | Freq: Two times a day (BID) | ORAL | Status: DC

## 2015-09-03 NOTE — ED Provider Notes (Signed)
CSN: [id number]     Arrival date & time 09/03/15  1709 History   First MD Initiated Contact with Patient 09/03/15 1755     Chief Complaint  Patient presents with  . Neck Pain   (Consider location/radiation/quality/duration/timing/severity/associated sxs/prior Treatment) HPI  She is a 44 year old woman here for evaluation of neck pain and shortness of breath. She states her symptoms started on Friday with shortness of breath and cough. She denies any associated fevers, chills, nasal congestion, rhinorrhea. She is a smoker and has a history of COPD. She has been using her albuterol inhaler without improvement. She reports dyspnea on exertion. No orthopnea. She states she feels like she cannot take a deep breath because someone is sitting on her chest. Reports feeling lightheaded at times due to shortness of breath. Denies chest pain. She also reports pain in her left neck and shoulder. This is been a recurring problem. She has been told in the past that she carries stress and tension there. Pain is worse with movement and coughing. She describes it as a tightness and pulling sensation.  No radiating pain.  Past Medical History  Diagnosis Date  . Asthma   . Renal disorder     kidney infections  . COPD (chronic obstructive pulmonary disease) ([place])   . Irregular heart rate    Past Surgical History  Procedure Laterality Date  . Appendectomy    . Tubal ligation     Family History  Problem Relation Age of Onset  . Hypertension Mother   . Hypertension Father    Social History  Substance Use Topics  . Smoking status: Current Every Day Smoker -- 1.50 packs/day    Types: Cigarettes  . Smokeless tobacco: Never Used  . Alcohol Use: Yes     Comment: rare   OB History    No data available     Review of Systems  Constitutional: Positive for fatigue. Negative for fever and chills.  HENT: Negative for congestion and rhinorrhea.   Respiratory: Positive for cough, chest tightness, shortness of  breath and wheezing.   Cardiovascular: Positive for palpitations. Negative for chest pain.  Musculoskeletal: Positive for neck pain.  Neurological: Positive for light-headedness.    Allergies  Bactrim  Home Medications   Prior to Admission medications   Medication Sig Start Date End Date Taking? Authorizing Provider  acetaminophen (TYLENOL) 500 MG tablet Take 1,000 mg by mouth every 6 (six) hours as needed for moderate pain.    Historical Provider, MD  albuterol (PROVENTIL HFA;VENTOLIN HFA) 108 (90 BASE) MCG/ACT inhaler Inhale 2 puffs into the lungs every 6 (six) hours as needed for wheezing or shortness of breath. 09/03/15   Melony Overly, MD  diazepam (VALIUM) 5 MG tablet Take 1 tablet (5 mg total) by mouth every 12 (twelve) hours as needed for muscle spasms. 07/09/15   Melony Overly, MD  doxycycline (VIBRAMYCIN) 100 MG capsule Take 1 capsule (100 mg total) by mouth 2 (two) times daily. 09/03/15   Melony Overly, MD  HYDROcodone-acetaminophen (NORCO) 5-325 MG per tablet Take 1 tablet by mouth every 6 (six) hours as needed for moderate pain. 07/09/15   Melony Overly, MD  HYDROcodone-homatropine [facility]) 5-1.5 MG/5ML syrup Take 5 mLs by mouth every 6 (six) hours as needed for cough. 09/03/15   Melony Overly, MD  meloxicam (MOBIC) 15 MG tablet Take 1 tablet (15 mg total) by mouth daily. 07/09/15   Melony Overly, MD  predniSONE (DELTASONE) 50 MG tablet  Take 1 pill daily for 5 days. 09/03/15   Melony Overly, MD  traMADol (ULTRAM) 50 MG tablet Take 1 tablet (50 mg total) by mouth every 6 (six) hours as needed for severe pain. 06/20/15   Carmin Muskrat, MD   Meds Ordered and Administered this Visit   Medications  methylPREDNISolone acetate (DEPO-MEDROL) injection 80 mg (80 mg Intramuscular Given 09/03/15 1845)  ipratropium-albuterol (DUONEB) 0.5-2.5 (3) MG/3ML nebulizer solution 3 mL (3 mLs Nebulization Given 09/03/15 1845)    BP 132/83 mmHg  Pulse 101  Temp(Src) 98.8 F (37.1 C) (Oral)  Resp 20  SpO2  95%  LMP 08/22/2015 No data found.   Physical Exam  Constitutional: She is oriented to person, place, and time. She appears well-developed and well-nourished. No distress.  Cardiovascular: Regular rhythm and normal heart sounds.   No murmur heard. Slightly tachycardic around 100  Pulmonary/Chest: She is in respiratory distress (she does appear short of breath. Able to speak in full sentences. ). She has wheezes (Scattered). She has no rales.  Scattered rhonchi  Musculoskeletal:  She has mild tenderness along the left neck and superior shoulder. Pain is worse with lateral bending to the right.  Neurological: She is alert and oriented to person, place, and time.    ED Course  Procedures (including critical care time) ED ECG REPORT   Date: 09/03/2015  Rate: 75  Rhythm: normal sinus rhythm  QRS Axis: normal  Intervals: normal  ST/T Wave abnormalities: normal  Conduction Disutrbances:none  Narrative Interpretation: normal ekg  Old EKG Reviewed: unchanged  I have personally reviewed the EKG tracing and agree with the computerized printout as noted.   Labs Review Labs Reviewed - No data to display  Imaging Review Dg Chest 2 View  09/03/2015   CLINICAL DATA:  Increasing shortness of breath.  COPD.  EXAM: CHEST  2 VIEW  COMPARISON:  11/07/2013  FINDINGS: The heart size and mediastinal contours are within normal limits. Both lungs are clear. The visualized skeletal structures are unremarkable.  IMPRESSION: No active cardiopulmonary disease.   Electronically Signed   By: Lorriane Shire M.D.   On: 09/03/2015 18:46      MDM   1. COPD exacerbation ([place])   2. Neck pain on left side    Breathing is improved after DuoNeb. She reports subjective improvement. Lungs are now clear. Treat for COPD exacerbation with prednisone and doxycycline. She will continue her albuterol every 4 hours. Hycodan as needed for cough. Discussed that the prednisone and Hycodan will also help with her  neck pain. If not improving in the next 2-3 days, she will go to the emergency room.    Melony Overly, MD 09/03/15 1910

## 2015-09-03 NOTE — Discharge Instructions (Signed)
The weather change likely flared up your lungs. Take prednisone daily for 5 days. Take doxycycline twice a day for 10 days. Use the albuterol every 4 hours for the next 2 days, then as needed. Use Hycodan every 4-6 hours as needed for cough. Do not drive while taking this medicine. The prednisone and Hycodan will also help with your neck and shoulder pain. If things are not improving in the next 2-3 days, please go to the emergency room.

## 2015-09-03 NOTE — ED Notes (Signed)
Pt reports pain in neck   /  Shoulder        X  2  Days    Pain    Is   Worse   On movement   And      When  She  Coughs        pt is  A  Smoker  Who  Reports   Shortness  Of  Breath         And  uses  An inhaler

## 2015-10-27 ENCOUNTER — Encounter (HOSPITAL_COMMUNITY): Payer: Self-pay

## 2015-10-27 ENCOUNTER — Emergency Department (HOSPITAL_COMMUNITY)
Admission: EM | Admit: 2015-10-27 | Discharge: 2015-10-27 | Disposition: A | Discharge status: Home / Self Care | Payer: Medicaid Other | Attending: Emergency Medicine | Admitting: Emergency Medicine

## 2015-10-27 DIAGNOSIS — Z8679 Personal history of other diseases of the circulatory system: Secondary | ICD-10-CM | POA: Insufficient documentation

## 2015-10-27 DIAGNOSIS — Z79899 Other long term (current) drug therapy: Secondary | ICD-10-CM | POA: Insufficient documentation

## 2015-10-27 DIAGNOSIS — J45909 Unspecified asthma, uncomplicated: Secondary | ICD-10-CM | POA: Diagnosis not present

## 2015-10-27 DIAGNOSIS — Z87448 Personal history of other diseases of urinary system: Secondary | ICD-10-CM | POA: Diagnosis not present

## 2015-10-27 DIAGNOSIS — J449 Chronic obstructive pulmonary disease, unspecified: Secondary | ICD-10-CM | POA: Insufficient documentation

## 2015-10-27 DIAGNOSIS — F17210 Nicotine dependence, cigarettes, uncomplicated: Secondary | ICD-10-CM | POA: Insufficient documentation

## 2015-10-27 DIAGNOSIS — M545 Low back pain, unspecified: Secondary | ICD-10-CM

## 2015-10-27 DIAGNOSIS — M62830 Muscle spasm of back: Secondary | ICD-10-CM | POA: Insufficient documentation

## 2015-10-27 MED ORDER — DIAZEPAM 5 MG PO TABS: 5.0000 mg | ORAL_TABLET | Freq: Three times a day (TID) | ORAL | Status: DC | PRN

## 2015-10-27 MED ORDER — BUPIVACAINE-EPINEPHRINE (PF) 0.5% -1:200000 IJ SOLN
10.0000 mL | Freq: Once | INTRAMUSCULAR | Status: AC
  Administered 2015-10-27: 5 mL
  Filled 2015-10-27: qty 30

## 2015-10-27 MED ORDER — NAPROXEN 500 MG PO TABS: 500.0000 mg | ORAL_TABLET | Freq: Two times a day (BID) | ORAL | Status: DC

## 2015-10-27 MED ORDER — HYDROCODONE-ACETAMINOPHEN 5-325 MG PO TABS: 1.0000 | ORAL_TABLET | Freq: Four times a day (QID) | ORAL | Status: DC | PRN

## 2015-10-27 MED ORDER — KETOROLAC TROMETHAMINE 60 MG/2ML IM SOLN
60.0000 mg | Freq: Once | INTRAMUSCULAR | Status: AC
  Administered 2015-10-27: 60 mg via INTRAMUSCULAR
  Filled 2015-10-27: qty 2

## 2015-10-27 NOTE — ED Notes (Signed)
Patient presents with acute on chronic low back pain since yesterday, worse RL side.  Patient denies N/V, urinary or fecal incontinence, dizziness and weakness in RL extremity.  Patient denies numbness/tingling in RLE.

## 2015-10-27 NOTE — ED Provider Notes (Addendum)
CSN: [id number]     Arrival date & time 10/27/15  0813 History   First MD Initiated Contact with Patient 10/27/15 [phone]     Chief Complaint  Patient presents with  . Back Pain     (Consider location/radiation/quality/duration/timing/severity/associated sxs/prior Treatment) Patient is a 44 y.o. female presenting with back pain. The history is provided by the patient.  Back Pain Location:  Lumbar spine Quality:  Aching, cramping, stabbing and shooting Radiates to:  R thigh and L thigh (camping sensation in thighs) Pain severity:  Severe Pain is:  Same all the time Onset quality:  Gradual Duration:  24 hours Timing:  Constant Progression:  Worsening Chronicity:  Recurrent Context comment:  Pt states she is constantly on the move and working 3 jobs but does not recall a specific injury.  Her back has not bothered her for over a year until yesterday Relieved by:  Nothing Worsened by:  Bending, ambulation and twisting Ineffective treatments:  Heating pad (tylenol pm) Associated symptoms: no abdominal pain, no bladder incontinence, no bowel incontinence, no dysuria, no fever, no headaches, no numbness, no paresthesias and no weakness   Risk factors comment:  Hx of back pain in the past but last bothered here about 1.5 years ago   Past Medical History  Diagnosis Date  . Asthma   . Renal disorder     kidney infections  . COPD (chronic obstructive pulmonary disease) ([place])   . Irregular heart rate    Past Surgical History  Procedure Laterality Date  . Appendectomy    . Tubal ligation     Family History  Problem Relation Age of Onset  . Hypertension Mother   . Hypertension Father    Social History  Substance Use Topics  . Smoking status: Current Every Day Smoker -- 1.50 packs/day    Types: Cigarettes  . Smokeless tobacco: Never Used  . Alcohol Use: Yes     Comment: rare   OB History    No data available     Review of Systems  Constitutional: Negative for fever.    Gastrointestinal: Negative for abdominal pain and bowel incontinence.  Genitourinary: Negative for bladder incontinence and dysuria.  Musculoskeletal: Positive for back pain.  Neurological: Negative for weakness, numbness, headaches and paresthesias.  All other systems reviewed and are negative.     Allergies  Bactrim  Home Medications   Prior to Admission medications   Medication Sig Start Date End Date Taking? Authorizing Provider  albuterol (PROVENTIL HFA;VENTOLIN HFA) 108 (90 BASE) MCG/ACT inhaler Inhale 2 puffs into the lungs every 6 (six) hours as needed for wheezing or shortness of breath. 09/03/15  Yes Melony Overly, MD  diphenhydramine-acetaminophen (TYLENOL PM) 25-500 MG TABS tablet Take 2 tablets by mouth at bedtime as needed (pain).   Yes Historical Provider, MD  diazepam (VALIUM) 5 MG tablet Take 1 tablet (5 mg total) by mouth every 12 (twelve) hours as needed for muscle spasms. Patient not taking: Reported on 10/27/2015 07/09/15   Melony Overly, MD  doxycycline (VIBRAMYCIN) 100 MG capsule Take 1 capsule (100 mg total) by mouth 2 (two) times daily. Patient not taking: Reported on 10/27/2015 09/03/15   Melony Overly, MD  HYDROcodone-acetaminophen [facility]) 5-325 MG per tablet Take 1 tablet by mouth every 6 (six) hours as needed for moderate pain. Patient not taking: Reported on 10/27/2015 07/09/15   Melony Overly, MD  HYDROcodone-homatropine [facility]) 5-1.5 MG/5ML syrup Take 5 mLs by mouth every 6 (six) hours as  needed for cough. Patient not taking: Reported on 10/27/2015 09/03/15   Melony Overly, MD  meloxicam (MOBIC) 15 MG tablet Take 1 tablet (15 mg total) by mouth daily. Patient not taking: Reported on 10/27/2015 07/09/15   Melony Overly, MD  predniSONE (DELTASONE) 50 MG tablet Take 1 pill daily for 5 days. Patient not taking: Reported on 10/27/2015 09/03/15   Melony Overly, MD  traMADol (ULTRAM) 50 MG tablet Take 1 tablet (50 mg total) by mouth every 6 (six) hours as needed for severe  pain. Patient not taking: Reported on 10/27/2015 06/20/15   Carmin Muskrat, MD   BP 102/90 mmHg  Pulse 81  Temp(Src) 97.6 F (36.4 C) (Oral)  Resp 14  SpO2 99%  LMP 10/22/2015 Physical Exam  Constitutional: She appears well-developed and well-nourished.  Appears uncomfortable  HENT:  Head: Normocephalic and atraumatic.  Eyes: EOM are normal. Pupils are equal, round, and reactive to light.  Cardiovascular: Normal rate.   Pulmonary/Chest: Effort normal.  Musculoskeletal:       Lumbar back: She exhibits decreased range of motion, tenderness, pain and spasm. She exhibits no bony tenderness and normal pulse.       Back:  Neurological: She is alert. She has normal strength. No sensory deficit. Gait normal.  Nursing note and vitals reviewed.   ED Course  Procedures (including critical care time) Labs Review Labs Reviewed - No data to display  Imaging Review No results found. I have personally reviewed and evaluated these images and lab results as part of my medical decision-making.   EKG Interpretation None      MDM   Final diagnoses:  Right-sided low back pain without sciatica    Pt with gradual onset of back pain suggestive of MSK back pain.  No neurovascular compromise and no incontinence.  Pt has no infectious sx, hx of CA  or other red flags concerning for pathologic back pain.  Pt is able to ambulate but is painful.  Normal strength and reflexes on exam.  Denies trauma. Will give toradol and trigger point injection.   9:30 AM Pt feels much better and d/ced home.   Blanchie Dessert, MD 10/27/15 3545  Blanchie Dessert, MD 10/27/15 0930

## 2015-10-27 NOTE — ED Notes (Addendum)
Pt with chronic pain in back.  Bulging disc.  Noted yesterday and tried heating pad.  Tried to go to work today and couldn't make it.  Pain in lower back.  Pain going down both legs in upper thighs.  Pt states no new injury.

## 2015-12-17 ENCOUNTER — Emergency Department (HOSPITAL_COMMUNITY)
Admission: EM | Admit: 2015-12-17 | Discharge: 2015-12-17 | Disposition: A | Discharge status: Home / Self Care | Payer: Medicaid Other | Attending: Emergency Medicine | Admitting: Emergency Medicine

## 2015-12-17 ENCOUNTER — Encounter (HOSPITAL_COMMUNITY): Payer: Self-pay | Admitting: Emergency Medicine

## 2015-12-17 DIAGNOSIS — F17210 Nicotine dependence, cigarettes, uncomplicated: Secondary | ICD-10-CM | POA: Insufficient documentation

## 2015-12-17 DIAGNOSIS — J449 Chronic obstructive pulmonary disease, unspecified: Secondary | ICD-10-CM | POA: Insufficient documentation

## 2015-12-17 DIAGNOSIS — Y998 Other external cause status: Secondary | ICD-10-CM | POA: Insufficient documentation

## 2015-12-17 DIAGNOSIS — X58XXXA Exposure to other specified factors, initial encounter: Secondary | ICD-10-CM | POA: Insufficient documentation

## 2015-12-17 DIAGNOSIS — S161XXA Strain of muscle, fascia and tendon at neck level, initial encounter: Secondary | ICD-10-CM | POA: Insufficient documentation

## 2015-12-17 DIAGNOSIS — Z791 Long term (current) use of non-steroidal anti-inflammatories (NSAID): Secondary | ICD-10-CM | POA: Insufficient documentation

## 2015-12-17 DIAGNOSIS — Z79899 Other long term (current) drug therapy: Secondary | ICD-10-CM | POA: Insufficient documentation

## 2015-12-17 DIAGNOSIS — Y9389 Activity, other specified: Secondary | ICD-10-CM | POA: Insufficient documentation

## 2015-12-17 DIAGNOSIS — Z87448 Personal history of other diseases of urinary system: Secondary | ICD-10-CM | POA: Insufficient documentation

## 2015-12-17 DIAGNOSIS — Y9289 Other specified places as the place of occurrence of the external cause: Secondary | ICD-10-CM | POA: Insufficient documentation

## 2015-12-17 MED ORDER — KETOROLAC TROMETHAMINE 30 MG/ML IJ SOLN
30.0000 mg | Freq: Once | INTRAMUSCULAR | Status: AC
  Administered 2015-12-17: 30 mg via INTRAMUSCULAR
  Filled 2015-12-17: qty 1

## 2015-12-17 MED ORDER — METHOCARBAMOL 500 MG PO TABS
750.0000 mg | ORAL_TABLET | Freq: Once | ORAL | Status: AC
  Administered 2015-12-17: 750 mg via ORAL
  Filled 2015-12-17: qty 2

## 2015-12-17 MED ORDER — NAPROXEN 500 MG PO TABS: 500.0000 mg | ORAL_TABLET | Freq: Two times a day (BID) | ORAL | Status: DC

## 2015-12-17 MED ORDER — METHOCARBAMOL 500 MG PO TABS: 500.0000 mg | ORAL_TABLET | Freq: Two times a day (BID) | ORAL | Status: DC

## 2015-12-17 MED ORDER — ACETAMINOPHEN 325 MG PO TABS
650.0000 mg | ORAL_TABLET | Freq: Once | ORAL | Status: AC
  Administered 2015-12-17: 650 mg via ORAL
  Filled 2015-12-17: qty 2

## 2015-12-17 NOTE — Discharge Instructions (Signed)
You were seen in the emergency room today for neck pain. Your pain is likely due to strain and inflammation. I gave you prescriptions for a muscle relaxer and naproxen. You may also use heat on and off as much as possible.   Take medications as prescribed. Return to the emergency room for worsening condition or new concerning symptoms. Follow up with your regular doctor. If you don't have a regular doctor use one of the numbers below to establish a primary care doctor.   Emergency Department Resource Guide 1) Find a Doctor and Pay Out of Pocket Although you won't have to find out who is covered by your insurance plan, it is a good idea to ask around and get recommendations. You will then need to call the office and see if the doctor you have chosen will accept you as a new patient and what types of options they offer for patients who are self-pay. Some doctors offer discounts or will set up payment plans for their patients who do not have insurance, but you will need to ask so you aren't surprised when you get to your appointment.  2) Contact Your Local Health Department Not all health departments have doctors that can see patients for sick visits, but many do, so it is worth a call to see if yours does. If you don't know where your local health department is, you can check in your phone book. The CDC also has a tool to help you locate your state's health department, and many state websites also have listings of all of their local health departments.  3) Find a [facility] If your illness is not likely to be very severe or complicated, you may want to try a walk in clinic. These are popping up all over the country in pharmacies, drugstores, and shopping centers. They're usually staffed by nurse practitioners or physician assistants that have been trained to treat common illnesses and complaints. They're usually fairly quick and inexpensive. However, if you have serious medical issues or chronic  medical problems, these are probably not your best option.  No Primary Care Doctor: - Call Health Connect at  [phone] - they can help you locate a primary care doctor that  accepts your insurance, provides certain services, etc. - Physician Referral [phone]  Emergency Department Resource Guide 1) Find a Doctor and Pay Out of Pocket Although you won't have to find out who is covered by your insurance plan, it is a good idea to ask around and get recommendations. You will then need to call the office and see if the doctor you have chosen will accept you as a new patient and what types of options they offer for patients who are self-pay. Some doctors offer discounts or will set up payment plans for their patients who do not have insurance, but you will need to ask so you aren't surprised when you get to your appointment.  2) Contact Your Local Health Department Not all health departments have doctors that can see patients for sick visits, but many do, so it is worth a call to see if yours does. If you don't know where your local health department is, you can check in your phone book. The CDC also has a tool to help you locate your state's health department, and many state websites also have listings of all of their local health departments.  3) Find a [facility] If your illness is not likely to be very severe or complicated, you  may want to try a walk in clinic. These are popping up all over the country in pharmacies, drugstores, and shopping centers. They're usually staffed by nurse practitioners or physician assistants that have been trained to treat common illnesses and complaints. They're usually fairly quick and inexpensive. However, if you have serious medical issues or chronic medical problems, these are probably not your best option.  No Primary Care Doctor: - Call Health Connect at  [phone] - they can help you locate a primary care doctor that  accepts your insurance,  provides certain services, etc. - Physician Referral Service- [phone]  Chronic Pain Problems: Organization         Address  Phone   Notes  [facility]  [phone] Patients need to be referred by their primary care doctor.   Medication Assistance: Organization         Address  Phone   Notes  [facility] Medication [facility] [address] [place], [place] [address] [phone] --Must be a resident of [facility] -- Must have NO insurance coverage whatsoever (no Medicaid/ Medicare, etc.) -- The pt. MUST have a primary care doctor that directs their care regularly and follows them in the community   [facility]  [phone]   [employer]  [phone]    Agencies that provide inexpensive medical care: Organization         Address  Phone   Notes  [place]  [phone]   [facility]    [phone]   [facility] [place], [place] [address] [phone]   [place] [address] [place] [phone]   [facility]    [phone]   [facility]    [phone]   [place] [address], [place] Phone:  [phone], Fax:  [phone] Hours of Operation:  9 am - 6 pm, M-F.  Also accepts Medicaid/Medicare and self-pay.  [facility] for [place] [address], [address], [place] Phone: [phone], Fax: [phone]. Hours of Operation:  8:30 am - 5:30 pm, M-F.  Also accepts Medicaid and self-pay.  [facility] [address] [place] Phone: [phone]   [place], [place], [place] [phone], Ext. [phone] Mondays & Thursdays: 7-9 AM.  First 15 patients are seen on a first come, first serve basis.    [place] Providers:  Organization         Address  Phone    Notes  [facility] [address], [place] [phone] Also accepts self-pay patients.  [facility] [address], [place]  [phone]   [place], [address], [place] [phone]   [facility] [address], [place] [phone]   Lucianne Lei [address], [place]   [phone] Only accepts [place] patients after they have their name applied to their card.   Self-Pay (no insurance) in [facility]:  Organization         Address  Phone   Notes  Sickle Cell Patients, [facility] [place] [phone]   [facility] [place] (  [facility] [place]  [address], [address], [place] [phone]   [facility]/Dr. Osei-Bonsu  [address], [place] or [address], [place] [phone] Phone number for both [place] and [place] locations is the same.  [facility] [address] [place] [phone]   [facility] [address] [place] or [address] [phone]   [facility] [address] [place] [phone], phone; [phone], fax Sees patients 1st and 3rd Saturday of every month.  Must not qualify for public or private insurance (i.e. Medicaid, Medicare,  Health Choice, Veterans' Benefits)  Household income should be no more than 200% of the poverty level The clinic cannot treat you if you are pregnant or think you are pregnant  Sexually transmitted diseases are not treated at the clinic.

## 2015-12-17 NOTE — ED Provider Notes (Signed)
CSN: [id number]     Arrival date & time 12/17/15  1415 History  By signing my name below, I, Hansel Feinstein, attest that this documentation has been prepared under the direction and in the presence of Tomisha Reppucci Y Sharolyn Weber, [place]. Electronically Signed: Hansel Feinstein, ED Scribe. 12/17/2015. 3:47 PM.    Chief Complaint  Patient presents with  . Neck Pain  . Arm Pain   The history is provided by the patient. No language interpreter was used.    HPI Comments: [name] is a 45 y.o. female with h/o COPD, asthma who presents to the Emergency Department complaining of moderate, gradually worsening, pulling right-sided neck pain with radiation to the right shoulder onset 3 days ago with associated right hand paresthesia. Pt denies recent trauma, injury, heavy lifting, falls, twisting, bending. Pt reports she has tried a heating pad and stretches with some relief, and notes that ice worsened her pain. She notes that she has taken Aleve with temporary relief. Pt works in [employer] and uses her arms frequently. She denies numbness, additional paresthesias, focal weakness.   Past Medical History  Diagnosis Date  . Asthma   . Renal disorder     kidney infections  . COPD (chronic obstructive pulmonary disease) ([place])   . Irregular heart rate    Past Surgical History  Procedure Laterality Date  . Appendectomy    . Tubal ligation     Family History  Problem Relation Age of Onset  . Hypertension Mother   . Hypertension Father    Social History  Substance Use Topics  . Smoking status: Current Every Day Smoker -- 1.50 packs/day    Types: Cigarettes  . Smokeless tobacco: Never Used  . Alcohol Use: Yes     Comment: rare   OB History    No data available     Review of Systems  Musculoskeletal: Positive for myalgias and neck pain.  Neurological: Negative for weakness and numbness.  All other systems reviewed and are negative.  Allergies  Bactrim  Home Medications   Prior to Admission medications    Medication Sig Start Date End Date Taking? Authorizing Provider  albuterol (PROVENTIL HFA;VENTOLIN HFA) 108 (90 BASE) MCG/ACT inhaler Inhale 2 puffs into the lungs every 6 (six) hours as needed for wheezing or shortness of breath. 09/03/15   Melony Overly, MD  diazepam (VALIUM) 5 MG tablet Take 1 tablet (5 mg total) by mouth every 8 (eight) hours as needed for muscle spasms. 10/27/15   Blanchie Dessert, MD  diphenhydramine-acetaminophen (TYLENOL PM) 25-500 MG TABS tablet Take 2 tablets by mouth at bedtime as needed (pain).    Historical Provider, MD  HYDROcodone-acetaminophen (NORCO/VICODIN) 5-325 MG tablet Take 1-2 tablets by mouth every 6 (six) hours as needed. 10/27/15   Blanchie Dessert, MD  HYDROcodone-homatropine (HYCODAN) 5-1.5 MG/5ML syrup Take 5 mLs by mouth every 6 (six) hours as needed for cough. Patient not taking: Reported on 10/27/2015 09/03/15   Melony Overly, MD  meloxicam (MOBIC) 15 MG tablet Take 1 tablet (15 mg total) by mouth daily. Patient not taking: Reported on 10/27/2015 07/09/15   Melony Overly, MD  naproxen (NAPROSYN) 500 MG tablet Take 1 tablet (500 mg total) by mouth 2 (two) times daily. 10/27/15   Blanchie Dessert, MD  predniSONE (DELTASONE) 50 MG tablet Take 1 pill daily for 5 days. Patient not taking: Reported on 10/27/2015 09/03/15   Melony Overly, MD  traMADol (ULTRAM) 50 MG tablet Take 1 tablet (50  mg total) by mouth every 6 (six) hours as needed for severe pain. Patient not taking: Reported on 10/27/2015 06/20/15   Carmin Muskrat, MD   BP 138/91 mmHg  Pulse 90  Temp(Src) 97 F (36.1 C)  Resp 18  SpO2 99% Physical Exam  Constitutional: She is oriented to person, place, and time. She appears well-developed and well-nourished.  HENT:  Head: Normocephalic and atraumatic.  Right Ear: External ear normal.  Left Ear: External ear normal.  Right cervical paraspinal muscles diffusely ttp with spasm. Tense feeling. Tenderness extends across right trapezius. Right shoulder and  upper arm nontender.  Eyes: Conjunctivae and EOM are normal. Pupils are equal, round, and reactive to light.  Neck: Normal range of motion. Neck supple.  Cardiovascular: Normal rate.   Pulmonary/Chest: Effort normal. No respiratory distress.  Abdominal: She exhibits no distension.  Musculoskeletal: Normal range of motion.  Intact strength and sensation in bilateral upper extremities. Good grip strength bilaterally.   Neurological: She is alert and oriented to person, place, and time.  Skin: Skin is warm and dry.  Psychiatric: She has a normal mood and affect. Her behavior is normal.  Nursing note and vitals reviewed.   ED Course  Procedures (including critical care time) DIAGNOSTIC STUDIES: Oxygen Saturation is 99% on RA, normal by my interpretation.    COORDINATION OF CARE: 3:46 PM Discussed treatment plan with pt at bedside which includes muscle relaxer, pain management and pt agreed to plan.   MDM   Final diagnoses:  Cervical strain, initial encounter    Likely msk pain /cervical strain. Poss cervical radiculopathy vs neurogenic thoracic outlet syndrome but no weakness on exam, only mild paresthesias. Pt reports improvement in symptoms with toradol, robaxin, tylenol. Rx given for robaxin and naproxen. Encouraged heat use. Resource guide given to establish PCP. ER return precautions given.   I personally performed the services described in this documentation, which was scribed in my presence. The recorded information has been reviewed and is accurate.   Anne Ng, PA-C 12/17/15 1709  Noemi Chapel, MD 12/18/15 1330

## 2015-12-17 NOTE — ED Notes (Addendum)
Pain to neck/ pulling sensation  that radates to rt arm for 3 weeks now, worse with movement, using heat, ice and aleve with minimal relief causing tingling at times. No chest pain, or sob

## 2016-03-24 ENCOUNTER — Emergency Department (HOSPITAL_COMMUNITY)
Admission: EM | Admit: 2016-03-24 | Discharge: 2016-03-24 | Disposition: A | Discharge status: Home / Self Care | Payer: Medicaid Other | Attending: Emergency Medicine | Admitting: Emergency Medicine

## 2016-03-24 ENCOUNTER — Encounter (HOSPITAL_COMMUNITY): Payer: Self-pay | Admitting: *Deleted

## 2016-03-24 DIAGNOSIS — Z79899 Other long term (current) drug therapy: Secondary | ICD-10-CM | POA: Diagnosis not present

## 2016-03-24 DIAGNOSIS — Z791 Long term (current) use of non-steroidal anti-inflammatories (NSAID): Secondary | ICD-10-CM | POA: Diagnosis not present

## 2016-03-24 DIAGNOSIS — M62830 Muscle spasm of back: Secondary | ICD-10-CM | POA: Diagnosis not present

## 2016-03-24 DIAGNOSIS — J449 Chronic obstructive pulmonary disease, unspecified: Secondary | ICD-10-CM | POA: Diagnosis not present

## 2016-03-24 DIAGNOSIS — Z87448 Personal history of other diseases of urinary system: Secondary | ICD-10-CM | POA: Diagnosis not present

## 2016-03-24 DIAGNOSIS — F17210 Nicotine dependence, cigarettes, uncomplicated: Secondary | ICD-10-CM | POA: Diagnosis not present

## 2016-03-24 DIAGNOSIS — M546 Pain in thoracic spine: Secondary | ICD-10-CM | POA: Diagnosis not present

## 2016-03-24 MED ORDER — OXYCODONE-ACETAMINOPHEN 5-325 MG PO TABS
1.0000 | ORAL_TABLET | Freq: Once | ORAL | Status: AC
  Administered 2016-03-24: 1 via ORAL
  Filled 2016-03-24: qty 1

## 2016-03-24 MED ORDER — CYCLOBENZAPRINE HCL 10 MG PO TABS: 10.0000 mg | ORAL_TABLET | Freq: Two times a day (BID) | ORAL | Status: DC | PRN

## 2016-03-24 MED ORDER — NAPROXEN 500 MG PO TABS: 500.0000 mg | ORAL_TABLET | Freq: Two times a day (BID) | ORAL | Status: DC

## 2016-03-24 NOTE — Discharge Instructions (Signed)
Back Pain, Adult °Back pain is very common in adults. The cause of back pain is rarely dangerous and the pain often gets better over time. The cause of your back pain may not be known. Some common causes of back pain include: °· Strain of the muscles or ligaments supporting the spine. °· Wear and tear (degeneration) of the spinal disks. °· Arthritis. °· Direct injury to the back. °For many people, back pain may return. Since back pain is rarely dangerous, most people can learn to manage this condition on their own. °HOME CARE INSTRUCTIONS °Watch your back pain for any changes. The following actions may help to lessen any discomfort you are feeling: °· Remain active. It is stressful on your back to sit or stand in one place for long periods of time. Do not sit, drive, or stand in one place for more than 30 minutes at a time. Take short walks on even surfaces as soon as you are able. Try to increase the length of time you walk each day. °· Exercise regularly as directed by your health care provider. Exercise helps your back heal faster. It also helps avoid future injury by keeping your muscles strong and flexible. °· Do not stay in bed. Resting more than 1-2 days can delay your recovery. °· Pay attention to your body when you bend and lift. The most comfortable positions are those that put less stress on your recovering back. Always use proper lifting techniques, including: °· Bending your knees. °· Keeping the load close to your body. °· Avoiding twisting. °· Find a comfortable position to sleep. Use a firm mattress and lie on your side with your knees slightly bent. If you lie on your back, put a pillow under your knees. °· Avoid feeling anxious or stressed. Stress increases muscle tension and can worsen back pain. It is important to recognize when you are anxious or stressed and learn ways to manage it, such as with exercise. °· Take medicines only as directed by your health care provider. Over-the-counter  medicines to reduce pain and inflammation are often the most helpful. Your health care provider may prescribe muscle relaxant drugs. These medicines help dull your pain so you can more quickly return to your normal activities and healthy exercise. °· Apply ice to the injured area: °· Put ice in a plastic bag. °· Place a towel between your skin and the bag. °· Leave the ice on for 20 minutes, 2-3 times a day for the first 2-3 days. After that, ice and heat may be alternated to reduce pain and spasms. °· Maintain a healthy weight. Excess weight puts extra stress on your back and makes it difficult to maintain good posture. °SEEK MEDICAL CARE IF: °· You have pain that is not relieved with rest or medicine. °· You have increasing pain going down into the legs or buttocks. °· You have pain that does not improve in one week. °· You have night pain. °· You lose weight. °· You have a fever or chills. °SEEK IMMEDIATE MEDICAL CARE IF:  °· You develop new bowel or bladder control problems. °· You have unusual weakness or numbness in your arms or legs. °· You develop nausea or vomiting. °· You develop abdominal pain. °· You feel faint. °  °This information is not intended to replace advice given to you by your health care provider. Make sure you discuss any questions you have with your health care provider. °  °Document Released: 11/11/2005 Document Revised: 12/02/2014 Document Reviewed: 03/15/2014 °Elsevier Interactive Patient Education ©2016 Elsevier   Inc.  Muscle Cramps and Spasms Muscle cramps and spasms occur when a muscle or muscles tighten and you have no control over this tightening (involuntary muscle contraction). They are a common problem and can develop in any muscle. The most common place is in the calf muscles of the leg. Both muscle cramps and muscle spasms are involuntary muscle contractions, but they also have differences:   Muscle cramps are sporadic and painful. They may last a few seconds to a quarter of  an hour. Muscle cramps are often more forceful and last longer than muscle spasms.  Muscle spasms may or may not be painful. They may also last just a few seconds or much longer. CAUSES  It is uncommon for cramps or spasms to be due to a serious underlying problem. In many cases, the cause of cramps or spasms is unknown. Some common causes are:   Overexertion.   Overuse from repetitive motions (doing the same thing over and over).   Remaining in a certain position for a long period of time.   Improper preparation, form, or technique while performing a sport or activity.   Dehydration.   Injury.   Side effects of some medicines.   Abnormally low levels of the salts and ions in your blood (electrolytes), especially potassium and calcium. This could happen if you are taking water pills (diuretics) or you are pregnant.  Some underlying medical problems can make it more likely to develop cramps or spasms. These include, but are not limited to:   Diabetes.   Parkinson disease.   Hormone disorders, such as thyroid problems.   Alcohol abuse.   Diseases specific to muscles, joints, and bones.   Blood vessel disease where not enough blood is getting to the muscles.  HOME CARE INSTRUCTIONS   Stay well hydrated. Drink enough water and fluids to keep your urine clear or pale yellow.  It may be helpful to massage, stretch, and relax the affected muscle.  For tight or tense muscles, use a warm towel, heating pad, or hot shower water directed to the affected area.  If you are sore or have pain after a cramp or spasm, applying ice to the affected area may relieve discomfort.  Put ice in a plastic bag.  Place a towel between your skin and the bag.  Leave the ice on for 15-20 minutes, 03-04 times a day.  Medicines used to treat a known cause of cramps or spasms may help reduce their frequency or severity. Only take over-the-counter or prescription medicines as directed by  your caregiver. SEEK MEDICAL CARE IF:  Your cramps or spasms get more severe, more frequent, or do not improve over time.  MAKE SURE YOU:   Understand these instructions.  Will watch your condition.  Will get help right away if you are not doing well or get worse.   This information is not intended to replace advice given to you by your health care provider. Make sure you discuss any questions you have with your health care provider.   Follow-up with your primary care provider if her symptoms aren't improved. Take Flexeril and Naprosyn as needed for pain and inflammation. Apply ice to affected area. Return to the emergency department if you experience severe worsening of your symptoms, bowel or bladder incontinence, numbness or tingling in both of your lower extremities, fever.

## 2016-03-24 NOTE — ED Notes (Signed)
Pt is here with mid back pain after she bent over to pick up her shoe and feels like it locked up.

## 2016-03-24 NOTE — ED Provider Notes (Signed)
CSN: [id number]     Arrival date & time 03/24/16  1322 History   First MD Initiated Contact with Patient 03/24/16 1413     Chief Complaint  Patient presents with  . Back Pain     (Consider location/radiation/quality/duration/timing/severity/associated sxs/prior Treatment) HPI   [name] is a 45 y.o F with a pmhx of asthma who presents to the ED today c/o back pain. Pt states That this morning she bent down to tie her shoes and felt sudden onset upper back pain. Pain is worsened with movement and with sitting up. She feels like the muscles in her back are spasming. She has not tried taking anything for her symptoms. 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 She denies bowel or bladder incontinence, saddle anesthesia, fever, chills, IV drug use.  Past Medical History  Diagnosis Date  . Asthma   . Renal disorder     kidney infections  . COPD (chronic obstructive pulmonary disease) ([place])   . Irregular heart rate    Past Surgical History  Procedure Laterality Date  . Appendectomy    . Tubal ligation     Family History  Problem Relation Age of Onset  . Hypertension Mother   . Hypertension Father    Social History  Substance Use Topics  . Smoking status: Current Every Day Smoker -- 1.50 packs/day    Types: Cigarettes  . Smokeless tobacco: Never Used  . Alcohol Use: Yes     Comment: rare   OB History    No data available     Review of Systems  All other systems reviewed and are negative.     Allergies  Bactrim  Home Medications   Prior to Admission medications   Medication Sig Start Date End Date Taking? Authorizing Provider  albuterol (PROVENTIL HFA;VENTOLIN HFA) 108 (90 BASE) MCG/ACT inhaler Inhale 2 puffs into the lungs every 6 (six) hours as needed for wheezing or shortness of breath. 09/03/15   Melony Overly, MD  diazepam (VALIUM) 5 MG tablet Take 1 tablet (5 mg total)  by mouth every 8 (eight) hours as needed for muscle spasms. 10/27/15   Blanchie Dessert, MD  diphenhydramine-acetaminophen (TYLENOL PM) 25-500 MG TABS tablet Take 2 tablets by mouth at bedtime as needed (pain).    Historical Provider, MD  HYDROcodone-acetaminophen (NORCO/VICODIN) 5-325 MG tablet Take 1-2 tablets by mouth every 6 (six) hours as needed. 10/27/15   Blanchie Dessert, MD  HYDROcodone-homatropine (HYCODAN) 5-1.5 MG/5ML syrup Take 5 mLs by mouth every 6 (six) hours as needed for cough. Patient not taking: Reported on 10/27/2015 09/03/15   Melony Overly, MD  meloxicam (MOBIC) 15 MG tablet Take 1 tablet (15 mg total) by mouth daily. Patient not taking: Reported on 10/27/2015 07/09/15   Melony Overly, MD  methocarbamol (ROBAXIN) 500 MG tablet Take 1 tablet (500 mg total) by mouth 2 (two) times daily. 12/17/15   Olivia Canter Sam, PA-C  naproxen (NAPROSYN) 500 MG tablet Take 1 tablet (500 mg total) by mouth 2 (two) times daily. 10/27/15   Blanchie Dessert, MD  naproxen (NAPROSYN) 500 MG tablet Take 1 tablet (500 mg total) by mouth 2 (two) times daily. 12/17/15   Olivia Canter Sam, PA-C  predniSONE (DELTASONE) 50 MG tablet Take 1 pill daily for 5 days. Patient not taking: Reported on 10/27/2015 09/03/15   Melony Overly, MD  traMADol (  ULTRAM) 50 MG tablet Take 1 tablet (50 mg total) by mouth every 6 (six) hours as needed for severe pain. Patient not taking: Reported on 10/27/2015 06/20/15   Carmin Muskrat, MD   BP 124/108 mmHg  Pulse 111  Temp(Src) 98.5 F (36.9 C) (Oral)  Resp 20  SpO2 96%  LMP 03/23/2016 Physical Exam  Constitutional: She is oriented to person, place, and time. She appears well-developed and well-nourished. No distress.  HENT:  Head: Normocephalic and atraumatic.  Eyes: Conjunctivae are normal. Right eye exhibits no discharge. Left eye exhibits no discharge. No scleral icterus.  Cardiovascular: Normal rate.   Pulmonary/Chest: Effort normal.  Musculoskeletal:  TTP of bilateral thoracic  paraspinal muscles. No midline spinal tenderness. Full range of motion of seek, T, L-spine. No step-offs or obvious bony deformities.  Neurological: She is alert and oriented to person, place, and time. Coordination normal.  Strength 5/5 throughout. No sensory deficits.  No gait abnormality.  Skin: Skin is warm and dry. No rash noted. She is not diaphoretic. No erythema. No pallor.  Psychiatric: She has a normal mood and affect. Her behavior is normal.  Nursing note and vitals reviewed.   ED Course  Procedures (including critical care time) Labs Review Labs Reviewed - No data to display  Imaging Review No results found. I have personally reviewed and evaluated these images and lab results as part of my medical decision-making.   EKG Interpretation None      MDM   Final diagnoses:  Bilateral thoracic back pain  Muscle spasm of back    Patient with back pain, Likely thoracic paraspinal muscle spasm.  No neurological deficits and normal neuro exam. Patient is ambulatory in the ED without difficulty. No loss of bowel or bladder control.  No concern for cauda equina.  No fever, night sweats, weight loss, h/o cancer, IVDU.  Pain managed in ED. RICE protocol and pain medicine indicated and discussed with patient. Will DC home with Flexeril and Naprosyn. Follow-up with PCP.     Dondra Spry Willard, PA-C 03/24/16 [place], MD 03/24/16 Carollee Massed

## 2016-05-09 ENCOUNTER — Emergency Department (HOSPITAL_COMMUNITY)
Admission: EM | Admit: 2016-05-09 | Discharge: 2016-05-09 | Disposition: A | Discharge status: Home / Self Care | Payer: Medicaid Other | Attending: Emergency Medicine | Admitting: Emergency Medicine

## 2016-05-09 ENCOUNTER — Encounter (HOSPITAL_COMMUNITY): Payer: Self-pay | Admitting: Emergency Medicine

## 2016-05-09 DIAGNOSIS — S61212A Laceration without foreign body of right middle finger without damage to nail, initial encounter: Secondary | ICD-10-CM | POA: Diagnosis not present

## 2016-05-09 DIAGNOSIS — W269XXA Contact with unspecified sharp object(s), initial encounter: Secondary | ICD-10-CM | POA: Diagnosis not present

## 2016-05-09 DIAGNOSIS — J45909 Unspecified asthma, uncomplicated: Secondary | ICD-10-CM | POA: Insufficient documentation

## 2016-05-09 DIAGNOSIS — F17210 Nicotine dependence, cigarettes, uncomplicated: Secondary | ICD-10-CM | POA: Diagnosis not present

## 2016-05-09 DIAGNOSIS — Y929 Unspecified place or not applicable: Secondary | ICD-10-CM | POA: Diagnosis not present

## 2016-05-09 DIAGNOSIS — Y999 Unspecified external cause status: Secondary | ICD-10-CM | POA: Diagnosis not present

## 2016-05-09 DIAGNOSIS — Z79899 Other long term (current) drug therapy: Secondary | ICD-10-CM | POA: Insufficient documentation

## 2016-05-09 DIAGNOSIS — Y9389 Activity, other specified: Secondary | ICD-10-CM | POA: Diagnosis not present

## 2016-05-09 DIAGNOSIS — J449 Chronic obstructive pulmonary disease, unspecified: Secondary | ICD-10-CM | POA: Diagnosis not present

## 2016-05-09 MED ORDER — IBUPROFEN 600 MG PO TABS: 600.0000 mg | ORAL_TABLET | Freq: Four times a day (QID) | ORAL | Status: DC | PRN

## 2016-05-09 MED ORDER — LIDOCAINE HCL 1 % IJ SOLN
10.0000 mL | Freq: Once | INTRAMUSCULAR | Status: AC
  Administered 2016-05-09: 10 mL
  Filled 2016-05-09: qty 20

## 2016-05-09 MED ORDER — HYDROCODONE-ACETAMINOPHEN 5-325 MG PO TABS: 1.0000 | ORAL_TABLET | ORAL | Status: DC | PRN

## 2016-05-09 MED ORDER — LIDOCAINE HCL 2 % IJ SOLN
10.0000 mL | Freq: Once | INTRAMUSCULAR | Status: AC
  Administered 2016-05-09: 200 mg via INTRADERMAL
  Filled 2016-05-09: qty 20

## 2016-05-09 MED ORDER — HYDROCODONE-ACETAMINOPHEN 5-325 MG PO TABS
1.0000 | ORAL_TABLET | Freq: Once | ORAL | Status: AC
  Administered 2016-05-09: 1 via ORAL
  Filled 2016-05-09: qty 1

## 2016-05-09 NOTE — ED Provider Notes (Signed)
CSN: [id number]     Arrival date & time 05/09/16  1126 History   First MD Initiated Contact with Patient 05/09/16 1155     Chief Complaint  Patient presents with  . finger laceration      HPI  [name] is an 45 y.o. female with history of COPD who presents to the ED for evaluation of a laceration to her right middle finger. She states she was washing her car this morning and cut her right middle finger on an unknown surface. She states she immediately washed the cut out with water and applied pressure but she cannot get the bleeding to stop. She states the pain is 10/10 and constant; she has not tried anything to alleviate the pain. She denies anticoagulation use. States she has had her Tdap updated within the past couple of years.  Past Medical History  Diagnosis Date  . Asthma   . Renal disorder     kidney infections  . COPD (chronic obstructive pulmonary disease) ([place])   . Irregular heart rate    Past Surgical History  Procedure Laterality Date  . Appendectomy    . Tubal ligation     Family History  Problem Relation Age of Onset  . Hypertension Mother   . Hypertension Father    Social History  Substance Use Topics  . Smoking status: Current Every Day Smoker -- 1.50 packs/day    Types: Cigarettes  . Smokeless tobacco: Never Used  . Alcohol Use: Yes     Comment: rare   OB History    No data available     Review of Systems  All other systems reviewed and are negative.     Allergies  Bactrim  Home Medications   Prior to Admission medications   Medication Sig Start Date End Date Taking? Authorizing Provider  albuterol (PROVENTIL HFA;VENTOLIN HFA) 108 (90 BASE) MCG/ACT inhaler Inhale 2 puffs into the lungs every 6 (six) hours as needed for wheezing or shortness of breath. 09/03/15   Melony Overly, MD  cyclobenzaprine (FLEXERIL) 10 MG tablet Take 1 tablet (10 mg total) by mouth 2 (two) times daily as needed for muscle spasms. 03/24/16   Samantha Tripp Dowless,  PA-C  diazepam (VALIUM) 5 MG tablet Take 1 tablet (5 mg total) by mouth every 8 (eight) hours as needed for muscle spasms. 10/27/15   Blanchie Dessert, MD  diphenhydramine-acetaminophen (TYLENOL PM) 25-500 MG TABS tablet Take 2 tablets by mouth at bedtime as needed (pain).    Historical Provider, MD  HYDROcodone-acetaminophen (NORCO/VICODIN) 5-325 MG tablet Take 1-2 tablets by mouth every 6 (six) hours as needed. 10/27/15   Blanchie Dessert, MD  HYDROcodone-homatropine (HYCODAN) 5-1.5 MG/5ML syrup Take 5 mLs by mouth every 6 (six) hours as needed for cough. Patient not taking: Reported on 10/27/2015 09/03/15   Melony Overly, MD  meloxicam (MOBIC) 15 MG tablet Take 1 tablet (15 mg total) by mouth daily. Patient not taking: Reported on 10/27/2015 07/09/15   Melony Overly, MD  methocarbamol (ROBAXIN) 500 MG tablet Take 1 tablet (500 mg total) by mouth 2 (two) times daily. 12/17/15   Olivia Canter Armando Lauman, PA-C  naproxen (NAPROSYN) 500 MG tablet Take 1 tablet (500 mg total) by mouth 2 (two) times daily. 10/27/15   Blanchie Dessert, MD  naproxen (NAPROSYN) 500 MG tablet Take 1 tablet (500 mg total) by mouth 2 (two) times daily. 12/17/15   Olivia Canter Caden Fatica, PA-C  naproxen (NAPROSYN) 500 MG tablet Take 1 tablet (  500 mg total) by mouth 2 (two) times daily. 03/24/16   Samantha Tripp Dowless, PA-C  predniSONE (DELTASONE) 50 MG tablet Take 1 pill daily for 5 days. Patient not taking: Reported on 10/27/2015 09/03/15   Melony Overly, MD  traMADol (ULTRAM) 50 MG tablet Take 1 tablet (50 mg total) by mouth every 6 (six) hours as needed for severe pain. Patient not taking: Reported on 10/27/2015 06/20/15   Carmin Muskrat, MD   BP 129/115 mmHg  Pulse 114  Temp(Src) 98.1 F (36.7 C) (Oral)  SpO2 97% Physical Exam  Constitutional: She is oriented to person, place, and time. No distress.  Appears uncomfortable, NAD  HENT:  Head: Atraumatic.  Right Ear: External ear normal.  Left Ear: External ear normal.  Nose: Nose normal.  Eyes:  Conjunctivae are normal. No scleral icterus.  Neck: Normal range of motion. Neck supple.  Cardiovascular: Normal rate and regular rhythm.   Pulmonary/Chest: Effort normal. No respiratory distress.  Abdominal: She exhibits no distension.  Musculoskeletal:  Right middle finger with 0.5cm laceration/skin flap at distal volar aspect. Bleeding initially well controlled, but brisk bleeding will start up with minimal manipulation. No foreign bodies visualized or palpated. FROM of all digits.   Neurological: She is alert and oriented to person, place, and time.  Skin: Skin is warm and dry. She is not diaphoretic.  Psychiatric: She has a normal mood and affect. Her behavior is normal.  Nursing note and vitals reviewed.  , Filed Vitals:   05/09/16 1143 05/09/16 1328  BP: 129/115 111/73  Pulse: 114 96  Temp: 98.1 F (36.7 C) 98.2 F (36.8 C)  TempSrc: Oral Oral  Resp:  16  SpO2: 97% 98%     ED Course  Procedures (including critical care time) Labs Review Labs Reviewed - No data to display  Imaging Review No results found. I have personally reviewed and evaluated these images and lab results as part of my medical decision-making.   EKG Interpretation None      MDM   Final diagnoses:  Laceration of right middle finger w/o foreign body w/o damage to nail, initial encounter    Laceration repair completed by Harriet Butte, PA-C and PA student. Please see their note for procedure details.   Pt tolerated lac repair well. Patient may be safely discharged home. Discussed reasons for return (fever, stitches break/wound reopens, purulent wound drainage, redness, increasing pain). Patient to follow-up with primary care provider within one week for suture removal. Patient in understanding and agreement with the plan.   Anne Ng, PA-C 05/09/16 1405  Tanna Furry, MD 05/18/16 1346

## 2016-05-09 NOTE — ED Notes (Signed)
Patient states that she was washing her car and cut her right middle finger on something.  Patient has gone through 4 band aids and still bleeding.  Patient currently has on finger condom.

## 2016-05-09 NOTE — Discharge Instructions (Signed)

## 2016-05-09 NOTE — ED Provider Notes (Signed)
  Physical Exam  BP 111/73 mmHg  Pulse 96  Temp(Src) 98.2 F (36.8 C) (Oral)  Resp 16  SpO2 98%  Physical Exam  ED Course  .Marland KitchenLaceration Repair Date/Time: 05/09/2016 2:08 PM Performed by: Monico Blitz Authorized by: Monico Blitz Consent: Verbal consent obtained. Risks and benefits: risks, benefits and alternatives were discussed Consent given by: patient Patient identity confirmed: verbally with patient Body area: upper extremity Location details: right long finger Laceration length: 0.5 cm Tendon involvement: none Nerve involvement: none Vascular damage: no Anesthesia: digital block Local anesthetic: lidocaine 1% without epinephrine Anesthetic total: 5 ml Patient sedated: no Preparation: Patient was prepped and draped in the usual sterile fashion. Irrigation solution: saline Irrigation method: syringe Amount of cleaning: standard Debridement: none Skin closure: Ethilon (5-0) Number of sutures: 2 Technique: simple Approximation: close Approximation difficulty: simple Dressing: antibiotic ointment Patient tolerance: Patient tolerated the procedure well with no immediate complications          Monico Blitz, PA-C 05/09/16 1411  Carmin Muskrat, MD 05/09/16 1557

## 2016-07-21 ENCOUNTER — Encounter (HOSPITAL_COMMUNITY): Payer: Self-pay | Admitting: Emergency Medicine

## 2016-07-21 ENCOUNTER — Emergency Department (HOSPITAL_COMMUNITY)
Admission: EM | Admit: 2016-07-21 | Discharge: 2016-07-21 | Disposition: A | Discharge status: Home / Self Care | Payer: Medicaid Other | Attending: Emergency Medicine | Admitting: Emergency Medicine

## 2016-07-21 DIAGNOSIS — Y9389 Activity, other specified: Secondary | ICD-10-CM | POA: Insufficient documentation

## 2016-07-21 DIAGNOSIS — Z791 Long term (current) use of non-steroidal anti-inflammatories (NSAID): Secondary | ICD-10-CM | POA: Diagnosis not present

## 2016-07-21 DIAGNOSIS — Y990 Civilian activity done for income or pay: Secondary | ICD-10-CM | POA: Diagnosis not present

## 2016-07-21 DIAGNOSIS — F17210 Nicotine dependence, cigarettes, uncomplicated: Secondary | ICD-10-CM | POA: Insufficient documentation

## 2016-07-21 DIAGNOSIS — Y929 Unspecified place or not applicable: Secondary | ICD-10-CM | POA: Diagnosis not present

## 2016-07-21 DIAGNOSIS — J45909 Unspecified asthma, uncomplicated: Secondary | ICD-10-CM | POA: Insufficient documentation

## 2016-07-21 DIAGNOSIS — J449 Chronic obstructive pulmonary disease, unspecified: Secondary | ICD-10-CM | POA: Insufficient documentation

## 2016-07-21 DIAGNOSIS — Z79899 Other long term (current) drug therapy: Secondary | ICD-10-CM | POA: Diagnosis not present

## 2016-07-21 DIAGNOSIS — R109 Unspecified abdominal pain: Secondary | ICD-10-CM | POA: Diagnosis not present

## 2016-07-21 DIAGNOSIS — X500XXA Overexertion from strenuous movement or load, initial encounter: Secondary | ICD-10-CM | POA: Insufficient documentation

## 2016-07-21 DIAGNOSIS — S3992XA Unspecified injury of lower back, initial encounter: Secondary | ICD-10-CM | POA: Diagnosis present

## 2016-07-21 DIAGNOSIS — S39012A Strain of muscle, fascia and tendon of lower back, initial encounter: Secondary | ICD-10-CM | POA: Diagnosis not present

## 2016-07-21 LAB — URINALYSIS, ROUTINE W REFLEX MICROSCOPIC
Bilirubin Urine: NEGATIVE
Glucose, UA: NEGATIVE mg/dL
Hgb urine dipstick: NEGATIVE
Ketones, ur: NEGATIVE mg/dL
Leukocytes, UA: NEGATIVE
Nitrite: NEGATIVE
Protein, ur: NEGATIVE mg/dL
Specific Gravity, Urine: 1.039 — ABNORMAL HIGH (ref 1.005–1.030)
pH: 6 (ref 5.0–8.0)

## 2016-07-21 LAB — CBC WITH DIFFERENTIAL/PLATELET
Basophils Absolute: 0 10*3/uL (ref 0.0–0.1)
Basophils Relative: 0 %
EOS ABS: 0.1 10*3/uL (ref 0.0–0.7)
Eosinophils Relative: 2 %
HEMATOCRIT: 40.8 % (ref 36.0–46.0)
HEMOGLOBIN: 13.6 g/dL (ref 12.0–15.0)
LYMPHS ABS: 2.8 10*3/uL (ref 0.7–4.0)
LYMPHS PCT: 35 %
MCH: 34.8 pg — AB (ref 26.0–34.0)
MCHC: 33.3 g/dL (ref 30.0–36.0)
MCV: 104.3 fL — ABNORMAL HIGH (ref 78.0–100.0)
Monocytes Absolute: 0.7 10*3/uL (ref 0.1–1.0)
Monocytes Relative: 8 %
NEUTROS ABS: 4.3 10*3/uL (ref 1.7–7.7)
NEUTROS PCT: 55 %
Platelets: 305 10*3/uL (ref 150–400)
RBC: 3.91 MIL/uL (ref 3.87–5.11)
RDW: 13 % (ref 11.5–15.5)
WBC: 7.9 10*3/uL (ref 4.0–10.5)

## 2016-07-21 LAB — COMPREHENSIVE METABOLIC PANEL
ALK PHOS: 46 U/L (ref 38–126)
ALT: 13 U/L — AB (ref 14–54)
AST: 12 U/L — ABNORMAL LOW (ref 15–41)
Albumin: 4.2 g/dL (ref 3.5–5.0)
Anion gap: 6 (ref 5–15)
BUN: 12 mg/dL (ref 6–20)
CALCIUM: 9.3 mg/dL (ref 8.9–10.3)
CO2: 19 mmol/L — ABNORMAL LOW (ref 22–32)
CREATININE: 0.61 mg/dL (ref 0.44–1.00)
Chloride: 110 mmol/L (ref 101–111)
Glucose, Bld: 96 mg/dL (ref 65–99)
Potassium: 3.8 mmol/L (ref 3.5–5.1)
Sodium: 135 mmol/L (ref 135–145)
Total Bilirubin: 0.2 mg/dL — ABNORMAL LOW (ref 0.3–1.2)
Total Protein: 7.8 g/dL (ref 6.5–8.1)

## 2016-07-21 LAB — I-STAT BETA HCG BLOOD, ED (MC, WL, AP ONLY)

## 2016-07-21 LAB — LIPASE, BLOOD: LIPASE: 26 U/L (ref 11–51)

## 2016-07-21 MED ORDER — IBUPROFEN 200 MG PO TABS
600.0000 mg | ORAL_TABLET | Freq: Once | ORAL | Status: AC
  Administered 2016-07-21: 600 mg via ORAL
  Filled 2016-07-21: qty 3

## 2016-07-21 MED ORDER — HYDROMORPHONE HCL 1 MG/ML IJ SOLN
0.5000 mg | Freq: Once | INTRAMUSCULAR | Status: AC
  Administered 2016-07-21: 0.5 mg via INTRAMUSCULAR
  Filled 2016-07-21: qty 1

## 2016-07-21 MED ORDER — METHOCARBAMOL 500 MG PO TABS
1000.0000 mg | ORAL_TABLET | Freq: Once | ORAL | Status: AC
  Administered 2016-07-21: 1000 mg via ORAL
  Filled 2016-07-21: qty 2

## 2016-07-21 MED ORDER — METHOCARBAMOL 500 MG PO TABS: 500.0000 mg | ORAL_TABLET | Freq: Every evening | ORAL | 0 refills | Status: DC | PRN

## 2016-07-21 MED ORDER — HYDROCODONE-ACETAMINOPHEN 5-325 MG PO TABS: 2.0000 | ORAL_TABLET | ORAL | 0 refills | Status: DC | PRN

## 2016-07-21 MED ORDER — METHOCARBAMOL 1000 MG/10ML IJ SOLN
1000.0000 mg | Freq: Once | INTRAMUSCULAR | Status: DC
  Filled 2016-07-21: qty 10

## 2016-07-21 NOTE — ED Triage Notes (Signed)
Pt c/o progressive dull left flank pain Friday night. Severe CVAT tenderness to left flank. No nausea, emesis, urinary symptoms.  Hx bulging spinal disk, multiple kidney infections.

## 2016-07-21 NOTE — Discharge Instructions (Signed)
Take Ibuprofen '600mg'$  three times a day for the next 5 days. Use heat or ice on area as needed. A muscle relaxer and stronger pain medicine has been prescribed to you. Do not take these medicines together because they both cause drowsiness

## 2016-07-21 NOTE — ED Provider Notes (Signed)
[facility] DEPT Provider Note   CSN: [id number] Arrival date & time: 07/21/16  1602     History   Chief Complaint No chief complaint on file.   HPI [name] is a 45 y.o. female who presents with left flank pain. PMH significant for herniated disc, hx of pyelonephritis, COPD/asthma. She states that the pain started acutely 2 days ago. 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 The pain is on the L side, is non-radiating, and dull/achy. Movement makes it worse. She went home and took Tylenol and used a heating pad which provided mild relief. Yesterday and today the pain was more severe. She denies fever, syncope, trauma, unexplained weight loss, hx of cancer, loss of bowel/bladder function, saddle anesthesia, urinary retention, IVDU. She is concerned for a kidney infection because it "almost killed her" in that past. Normally her back pain has radicular pain but she has not had that either. Denies abdominal pain, N/V/D, irritative voiding symptoms, hematuria.    HPI  Past Medical History:  Diagnosis Date  . Asthma   . COPD (chronic obstructive pulmonary disease) ([place])   . Irregular heart rate   . Renal disorder    kidney infections    There are no active problems to display for this patient.   Past Surgical History:  Procedure Laterality Date  . APPENDECTOMY    . TUBAL LIGATION      OB History    No data available       Home Medications    Prior to Admission medications   Medication Sig Start Date End Date Taking? Authorizing Provider  albuterol (PROVENTIL HFA;VENTOLIN HFA) 108 (90 BASE) MCG/ACT inhaler Inhale 2 puffs into the lungs every 6 (six) hours as needed for wheezing or shortness of breath. 09/03/15   Melony Overly, MD  cyclobenzaprine (FLEXERIL) 10 MG tablet Take 1 tablet (10 mg total) by mouth 2 (two) times daily as needed for muscle spasms. 03/24/16   Samantha Tripp Dowless, PA-C  diazepam (VALIUM) 5 MG tablet Take 1 tablet (5  mg total) by mouth every 8 (eight) hours as needed for muscle spasms. 10/27/15   Blanchie Dessert, MD  diphenhydramine-acetaminophen (TYLENOL PM) 25-500 MG TABS tablet Take 2 tablets by mouth at bedtime as needed (pain).    Historical Provider, MD  HYDROcodone-acetaminophen (NORCO/VICODIN) 5-325 MG tablet Take 1-2 tablets by mouth every 6 (six) hours as needed. 10/27/15   Blanchie Dessert, MD  HYDROcodone-acetaminophen (NORCO/VICODIN) 5-325 MG tablet Take 1 tablet by mouth every 4 (four) hours as needed for severe pain. 05/09/16   Olivia Canter Sam, PA-C  HYDROcodone-homatropine (HYCODAN) 5-1.5 MG/5ML syrup Take 5 mLs by mouth every 6 (six) hours as needed for cough. Patient not taking: Reported on 10/27/2015 09/03/15   Melony Overly, MD  ibuprofen (ADVIL,MOTRIN) 600 MG tablet Take 1 tablet (600 mg total) by mouth every 6 (six) hours as needed. 05/09/16   Olivia Canter Sam, PA-C  meloxicam (MOBIC) 15 MG tablet Take 1 tablet (15 mg total) by mouth daily. Patient not taking: Reported on 10/27/2015 07/09/15   Melony Overly, MD  methocarbamol (ROBAXIN) 500 MG tablet Take 1 tablet (500 mg total) by mouth 2 (two) times daily. 12/17/15   Olivia Canter Sam, PA-C  naproxen (NAPROSYN) 500 MG tablet Take 1 tablet (500 mg total) by mouth 2 (two) times daily. 10/27/15   Blanchie Dessert, MD  naproxen (NAPROSYN) 500 MG tablet Take 1 tablet (500 mg total) by mouth 2 (  two) times daily. 12/17/15   Olivia Canter Sam, PA-C  naproxen (NAPROSYN) 500 MG tablet Take 1 tablet (500 mg total) by mouth 2 (two) times daily. 03/24/16   Samantha Tripp Dowless, PA-C  predniSONE (DELTASONE) 50 MG tablet Take 1 pill daily for 5 days. Patient not taking: Reported on 10/27/2015 09/03/15   Melony Overly, MD  traMADol (ULTRAM) 50 MG tablet Take 1 tablet (50 mg total) by mouth every 6 (six) hours as needed for severe pain. Patient not taking: Reported on 10/27/2015 06/20/15   Carmin Muskrat, MD    Family History Family History  Problem Relation Age of Onset  .  Hypertension Mother   . Hypertension Father     Social History Social History  Substance Use Topics  . Smoking status: Current Every Day Smoker    Packs/day: 1.50    Types: Cigarettes  . Smokeless tobacco: Never Used  . Alcohol use Yes     Comment: rare     Allergies   Bactrim [sulfamethoxazole-trimethoprim]   Review of Systems Review of Systems  Constitutional: Negative for fever.  Respiratory: Negative for shortness of breath.   Cardiovascular: Negative for chest pain.  Gastrointestinal: Negative for abdominal pain, constipation, diarrhea, nausea and vomiting.  Genitourinary: Positive for flank pain. Negative for dysuria, frequency, vaginal bleeding and vaginal discharge.  Musculoskeletal: Negative for back pain and neck pain.  All other systems reviewed and are negative.    Physical Exam Updated Vital Signs BP 130/90 (BP Location: Right Arm)   Pulse 102   Temp 99 F (37.2 C) (Oral)   Resp 15   Ht '5\' 8"'$  (1.727 m)   Wt 77.1 kg   SpO2 95%   BMI 25.85 kg/m   Physical Exam  Constitutional: She is oriented to person, place, and time. She appears well-developed and well-nourished. No distress.  HENT:  Head: Normocephalic and atraumatic.  Eyes: Conjunctivae are normal. Pupils are equal, round, and reactive to light. Right eye exhibits no discharge. Left eye exhibits no discharge. No scleral icterus.  Neck: Normal range of motion. Neck supple.  Cardiovascular: Normal rate and regular rhythm.  Exam reveals no gallop and no friction rub.   No murmur heard. Pulmonary/Chest: Effort normal and breath sounds normal. No respiratory distress. She has no wheezes. She has no rales. She exhibits no tenderness.  Abdominal: Soft. She exhibits no distension and no mass. There is no tenderness. There is no rebound and no guarding. No hernia.  Musculoskeletal: She exhibits no edema.  Inspection: No masses, deformity, or rash Palpation: No midline spinal tenderness. Lumbar  paraspinal muscle tenderness and right flank tenderness ROM: Deferred Strength: 5/5 in lower extremities and normal plantar and dorsiflexion Sensation: Intact sensation with light touch in lower extremities bilaterally Gait: Normal gait SLR: Negative seated straight leg raise   Neurological: She is alert and oriented to person, place, and time.  Skin: Skin is warm and dry.  Psychiatric: She has a normal mood and affect. Her behavior is normal.  Nursing note and vitals reviewed.    ED Treatments / Results  Labs (all labs ordered are listed, but only abnormal results are displayed) Labs Reviewed  URINALYSIS, ROUTINE W REFLEX MICROSCOPIC (NOT AT [facility]) - Abnormal; Notable for the following:       Result Value   Specific Gravity, Urine 1.039 (*)    All other components within normal limits  CBC WITH DIFFERENTIAL/PLATELET - Abnormal; Notable for the following:    MCV 104.3 (*)  MCH 34.8 (*)    All other components within normal limits  COMPREHENSIVE METABOLIC PANEL - Abnormal; Notable for the following:    CO2 19 (*)    AST 12 (*)    ALT 13 (*)    Total Bilirubin 0.2 (*)    All other components within normal limits  LIPASE, BLOOD  I-STAT BETA HCG BLOOD, ED ([facility], [facility], [facility] ONLY)    EKG  EKG Interpretation None       Radiology No results found.  Procedures Procedures (including critical care time)  Medications Ordered in ED Medications  ibuprofen (ADVIL,MOTRIN) tablet 600 mg (600 mg Oral Given 07/21/16 1812)  HYDROmorphone (DILAUDID) injection 0.5 mg (0.5 mg Intramuscular Given 07/21/16 1812)  methocarbamol (ROBAXIN) tablet 1,000 mg (1,000 mg Oral Given 07/21/16 1811)    Initial Impression / Assessment and Plan / ED Course  I have reviewed the triage vital signs and the nursing notes.  Pertinent labs & imaging results that were available during my care of the patient were reviewed by me and considered in my medical decision making (see chart for details).  Clinical  Course   45 year old female presents with symptoms consistent with lumbar strain. No red flag back pain signs. Patient is afebrile, not tachycardic or tachypneic, normotensive, and not hypoxic. CBC is remarkable for high MCV but no anemia. CMP overall unremarkable. Lipase is normal. Preg neg. UA remarkable for high spec gravity of 1.039. Robaxin, Ibuprofen, and Dilaudid given for pain.  On recheck patient feels marked improvement. Will d/c with similar meds. Patient is NAD, non-toxic, with stable VS. Patient is informed of clinical course, understands medical decision making process, and agrees with plan. Opportunity for questions provided and all questions answered. Return precautions given.   Final Clinical Impressions(s) / ED Diagnoses   Final diagnoses:  Lumbar strain, initial encounter    New Prescriptions New Prescriptions   HYDROCODONE-ACETAMINOPHEN (NORCO/VICODIN) 5-325 MG TABLET    Take 2 tablets by mouth every 4 (four) hours as needed.   METHOCARBAMOL (ROBAXIN) 500 MG TABLET    Take 1 tablet (500 mg total) by mouth at bedtime and may repeat dose one time if needed.     Recardo Evangelist, PA-C 07/21/16 Einar Crow    Virgel Manifold, MD 08/01/16 [phone]

## 2016-09-15 ENCOUNTER — Emergency Department (HOSPITAL_COMMUNITY)
Admission: EM | Admit: 2016-09-15 | Discharge: 2016-09-15 | Disposition: A | Discharge status: Home / Self Care | Payer: Medicaid Other | Attending: Emergency Medicine | Admitting: Emergency Medicine

## 2016-09-15 ENCOUNTER — Encounter (HOSPITAL_COMMUNITY): Payer: Self-pay | Admitting: *Deleted

## 2016-09-15 DIAGNOSIS — Z79899 Other long term (current) drug therapy: Secondary | ICD-10-CM | POA: Insufficient documentation

## 2016-09-15 DIAGNOSIS — J449 Chronic obstructive pulmonary disease, unspecified: Secondary | ICD-10-CM | POA: Insufficient documentation

## 2016-09-15 DIAGNOSIS — R000 Tachycardia, unspecified: Secondary | ICD-10-CM | POA: Insufficient documentation

## 2016-09-15 DIAGNOSIS — Z791 Long term (current) use of non-steroidal anti-inflammatories (NSAID): Secondary | ICD-10-CM | POA: Insufficient documentation

## 2016-09-15 DIAGNOSIS — F17210 Nicotine dependence, cigarettes, uncomplicated: Secondary | ICD-10-CM | POA: Insufficient documentation

## 2016-09-15 DIAGNOSIS — M545 Low back pain: Secondary | ICD-10-CM | POA: Insufficient documentation

## 2016-09-15 DIAGNOSIS — J45909 Unspecified asthma, uncomplicated: Secondary | ICD-10-CM | POA: Insufficient documentation

## 2016-09-15 MED ORDER — KETOROLAC TROMETHAMINE 60 MG/2ML IM SOLN
60.0000 mg | Freq: Once | INTRAMUSCULAR | Status: AC
  Administered 2016-09-15: 60 mg via INTRAMUSCULAR
  Filled 2016-09-15: qty 2

## 2016-09-15 MED ORDER — METHYLPREDNISOLONE SODIUM SUCC 125 MG IJ SOLR
125.0000 mg | Freq: Once | INTRAMUSCULAR | Status: DC
  Filled 2016-09-15: qty 2

## 2016-09-15 MED ORDER — METHYLPREDNISOLONE SODIUM SUCC 125 MG IJ SOLR
125.0000 mg | Freq: Once | INTRAMUSCULAR | Status: AC
  Administered 2016-09-15: 125 mg via INTRAMUSCULAR

## 2016-09-15 MED ORDER — METHOCARBAMOL 500 MG PO TABS: 500.0000 mg | ORAL_TABLET | Freq: Two times a day (BID) | ORAL | 0 refills | Status: DC

## 2016-09-15 MED ORDER — NAPROXEN 500 MG PO TABS: 500.0000 mg | ORAL_TABLET | Freq: Two times a day (BID) | ORAL | 0 refills | Status: DC

## 2016-09-15 NOTE — ED Triage Notes (Signed)
Pt complains of lower back pain since Thursday night. Pt has hx of bulging disc in lumbar region. Pt states pain feels similar to bulging disc.

## 2016-09-15 NOTE — ED Provider Notes (Signed)
[facility] DEPT Provider Note   CSN: [id number] Arrival date & time: 09/15/16  1419  By signing my name below, I, Maren Reamer, attest that this documentation has been prepared under the direction and in the presence of Eliezer Mccoy, PA-C. Electronically Signed: Maren Reamer, Scribe. 09/15/16. 10:15 AM.  History   Chief Complaint Chief Complaint  Patient presents with  . Back Pain   The history is provided by the patient. No language interpreter was used.  Back Pain   Pertinent negatives include no chest pain, no fever, no numbness, no headaches, no abdominal pain and no dysuria.    HPI Comments: [name] is a 45 y.o. female who presents to the Emergency Department complaining of lower back pain for the last 3 days or so. Has a history of lumbar bulging disc and says this pain feels similar. Saw a Neurosurgeon in the past and then stopped once she lost her insurance. Has had a Cortisone injection in the spine which did not work. Was going to start physical therapy but then lost her insurance. The last time she saw the Neurosurgeon was a few years ago. Has tried Tylenol PM and hot and cold compresses to relieve her symptoms. Denies numbness, tingling, or shooting pain. The pain is worse when she moves and no positions make it better. A systemic steroid injection has helped her pain in the past.  Denies fever, unexpected weight change,Bowel/bladder incontinence, saddle anesthesia, urinary problems, abdominal pain, chest pain, nausea, or vomiting. Has no reported history of IV drug use. No recent surgeries.    Past Medical History:  Diagnosis Date  . Asthma   . COPD (chronic obstructive pulmonary disease) ([place])   . Irregular heart rate   . Renal disorder    kidney infections    There are no active problems to display for this patient.   Past Surgical History:  Procedure Laterality Date  . APPENDECTOMY    . TUBAL LIGATION      OB History    No data available        Home Medications    Prior to Admission medications   Medication Sig Start Date End Date Taking? Authorizing Provider  albuterol (PROVENTIL HFA;VENTOLIN HFA) 108 (90 BASE) MCG/ACT inhaler Inhale 2 puffs into the lungs every 6 (six) hours as needed for wheezing or shortness of breath. 09/03/15   Melony Overly, MD  cyclobenzaprine (FLEXERIL) 10 MG tablet Take 1 tablet (10 mg total) by mouth 2 (two) times daily as needed for muscle spasms. 03/24/16   Samantha Tripp Dowless, PA-C  diazepam (VALIUM) 5 MG tablet Take 1 tablet (5 mg total) by mouth every 8 (eight) hours as needed for muscle spasms. 10/27/15   Blanchie Dessert, MD  diphenhydramine-acetaminophen (TYLENOL PM) 25-500 MG TABS tablet Take 2 tablets by mouth at bedtime as needed (pain).    Historical Provider, MD  HYDROcodone-acetaminophen (NORCO/VICODIN) 5-325 MG tablet Take 1 tablet by mouth every 4 (four) hours as needed for severe pain. 05/09/16   Anne Ng, PA-C  HYDROcodone-acetaminophen (NORCO/VICODIN) 5-325 MG tablet Take 2 tablets by mouth every 4 (four) hours as needed. 07/21/16   Recardo Evangelist, PA-C  ibuprofen (ADVIL,MOTRIN) 600 MG tablet Take 1 tablet (600 mg total) by mouth every 6 (six) hours as needed. 05/09/16   Olivia Canter Sam, PA-C  methocarbamol (ROBAXIN) 500 MG tablet Take 1 tablet (500 mg total) by mouth 2 (two) times daily. 09/15/16   Frederica Kuster, PA-C  naproxen (NAPROSYN)  500 MG tablet Take 1 tablet (500 mg total) by mouth 2 (two) times daily. 09/15/16   Frederica Kuster, PA-C    Family History Family History  Problem Relation Age of Onset  . Hypertension Mother   . Hypertension Father     Social History Social History  Substance Use Topics  . Smoking status: Current Every Day Smoker    Packs/day: 1.50    Types: Cigarettes  . Smokeless tobacco: Never Used  . Alcohol use Yes     Comment: rare     Allergies   Bactrim [sulfamethoxazole-trimethoprim]   Review of Systems Review of Systems   Constitutional: Negative for chills, fever and unexpected weight change.  HENT: Negative for facial swelling and sore throat.   Respiratory: Negative for shortness of breath.   Cardiovascular: Negative for chest pain.  Gastrointestinal: Negative for abdominal pain, nausea and vomiting.  Genitourinary: Negative for dysuria.  Musculoskeletal: Positive for back pain (lower).  Skin: Negative for rash and wound.  Neurological: Negative for numbness and headaches.       No tingling, no shooting pain  Psychiatric/Behavioral: The patient is not nervous/anxious.   All other systems reviewed and are negative.    Physical Exam Updated Vital Signs BP 136/86 (BP Location: Left Arm)   Pulse 103   Temp 97.7 F (36.5 C) (Oral)   Resp 18   Ht '5\' 8"'$  (1.727 m)   Wt 80.6 kg   SpO2 94%   BMI 27.00 kg/m   Physical Exam  Constitutional: She appears well-developed and well-nourished. No distress.  HENT:  Head: Normocephalic and atraumatic.  Mouth/Throat: Oropharynx is clear and moist. No oropharyngeal exudate.  Eyes: Conjunctivae are normal. Pupils are equal, round, and reactive to light. Right eye exhibits no discharge. Left eye exhibits no discharge. No scleral icterus.  Neck: Normal range of motion. Neck supple. No thyromegaly present.  Cardiovascular: Regular rhythm, normal heart sounds and intact distal pulses.  Tachycardia present.  Exam reveals no gallop and no friction rub.   No murmur heard. Pulmonary/Chest: Effort normal and breath sounds normal. No stridor. No respiratory distress. She has no wheezes. She has no rales.  Abdominal: Soft. Bowel sounds are normal. She exhibits no distension. There is no tenderness. There is no rebound and no guarding.  Musculoskeletal: She exhibits no edema.       Lumbar back: She exhibits no bony tenderness.       Back:  Right lumbar tenderness, no midline tenderness; 5/5 strength in lower extremities, normal sensation  Lymphadenopathy:    She has no  cervical adenopathy.  Neurological: She is alert. Coordination normal.  Reflex Scores:      Patellar reflexes are 2+ on the right side and 2+ on the left side. Skin: Skin is warm and dry. Capillary refill takes less than 2 seconds. No rash noted. She is not diaphoretic. No pallor.  Psychiatric: She has a normal mood and affect.  Nursing note and vitals reviewed.    ED Treatments / Results  Labs (all labs ordered are listed, but only abnormal results are displayed) Labs Reviewed - No data to display  EKG  EKG Interpretation None       Radiology No results found.  Procedures Procedures (including critical care time)  Medications Ordered in ED Medications  ketorolac (TORADOL) injection 60 mg (60 mg Intramuscular Given 09/15/16 1548)  methylPREDNISolone sodium succinate (SOLU-MEDROL) 125 mg/2 mL injection 125 mg (125 mg Intramuscular Given 09/15/16 1548)     Initial  Impression / Assessment and Plan / ED Course  I have reviewed the triage vital signs and the nursing notes.  Pertinent labs & imaging results that were available during my care of the patient were reviewed by me and considered in my medical decision making (see chart for details).  Clinical Course   Patient with back pain.  No neurological deficits and normal neuro exam.  Patient is ambulatory.  No loss of bowel or bladder control.  No concern for cauda equina.  No fever, night sweats, weight loss, h/o cancer, IVDA, no recent procedure to back. No urinary symptoms suggestive of UTI.  Supportive care and return precaution discussed.  Provided Solu-Medrol and Toradol injections today. Discharged with Naprosyn, Robaxin. She is not to drive while on the muscle relaxer. Continue with cold and hot compresses. Discussed return instructions and should return to the ED if any sudden increase in pain, numbness, tingling, or urinary/fecal incontinence.Patient understands and agrees with plan. Patient vitals stable throughout ED  course discharged in satisfactory condition.  I personally performed the services described in this documentation, which was scribed in my presence. The recorded information has been reviewed and is accurate.   Final Clinical Impressions(s) / ED Diagnoses   Final diagnoses:  Acute right-sided low back pain, with sciatica presence unspecified    New Prescriptions Discharge Medication List as of 09/15/2016  4:02 PM       Frederica Kuster, PA-C 09/16/16 [place], MD 09/20/16 [phone]

## 2016-09-15 NOTE — Discharge Instructions (Signed)
Medications: Robaxin, Naprosyn  Treatment: Take Robaxin twice daily as needed for muscle spasms. Take Naprosyn twice daily for 1 week. Use moist heat and ice alternating 20 minutes on, 20 minutes off. Attempt the back exercises as tolerated.  Follow-up: Please follow-up with your primary care provider and/or neurosurgeon for further evaluation and treatment. Please return to emergency department if you develop any new or worsening symptoms.

## 2016-10-25 ENCOUNTER — Emergency Department (HOSPITAL_COMMUNITY)
Admission: EM | Admit: 2016-10-25 | Discharge: 2016-10-25 | Disposition: A | Discharge status: Home / Self Care | Payer: Medicaid Other | Attending: Emergency Medicine | Admitting: Emergency Medicine

## 2016-10-25 ENCOUNTER — Encounter (HOSPITAL_COMMUNITY): Payer: Self-pay

## 2016-10-25 DIAGNOSIS — X500XXA Overexertion from strenuous movement or load, initial encounter: Secondary | ICD-10-CM | POA: Insufficient documentation

## 2016-10-25 DIAGNOSIS — Y939 Activity, unspecified: Secondary | ICD-10-CM | POA: Insufficient documentation

## 2016-10-25 DIAGNOSIS — Y999 Unspecified external cause status: Secondary | ICD-10-CM | POA: Insufficient documentation

## 2016-10-25 DIAGNOSIS — Z79899 Other long term (current) drug therapy: Secondary | ICD-10-CM | POA: Insufficient documentation

## 2016-10-25 DIAGNOSIS — S39012A Strain of muscle, fascia and tendon of lower back, initial encounter: Secondary | ICD-10-CM

## 2016-10-25 DIAGNOSIS — J449 Chronic obstructive pulmonary disease, unspecified: Secondary | ICD-10-CM | POA: Insufficient documentation

## 2016-10-25 DIAGNOSIS — F17210 Nicotine dependence, cigarettes, uncomplicated: Secondary | ICD-10-CM | POA: Insufficient documentation

## 2016-10-25 DIAGNOSIS — Y929 Unspecified place or not applicable: Secondary | ICD-10-CM | POA: Insufficient documentation

## 2016-10-25 MED ORDER — OXYCODONE-ACETAMINOPHEN 5-325 MG PO TABS
1.0000 | ORAL_TABLET | Freq: Once | ORAL | Status: AC
  Administered 2016-10-25: 1 via ORAL
  Filled 2016-10-25: qty 1

## 2016-10-25 MED ORDER — METHOCARBAMOL 500 MG PO TABS: 500.0000 mg | ORAL_TABLET | Freq: Two times a day (BID) | ORAL | 0 refills | Status: DC

## 2016-10-25 MED ORDER — IBUPROFEN 600 MG PO TABS: 600.0000 mg | ORAL_TABLET | Freq: Four times a day (QID) | ORAL | 0 refills | Status: DC | PRN

## 2016-10-25 MED ORDER — METHOCARBAMOL 500 MG PO TABS
500.0000 mg | ORAL_TABLET | Freq: Once | ORAL | Status: AC
  Administered 2016-10-25: 500 mg via ORAL
  Filled 2016-10-25: qty 1

## 2016-10-25 NOTE — ED Triage Notes (Signed)
Pt lifting box at work.  Felt pull in back. Difficulty ambulating.

## 2016-10-25 NOTE — Discharge Instructions (Signed)
Please read and follow all provided instructions.  Your diagnoses today include:  1. Back strain, initial encounter     Tests performed today include: Vital signs - see below for your results today  Medications prescribed:   Take any prescribed medications only as directed.  Home care instructions:  Follow any educational materials contained in this packet Please rest, use ice or heat on your back for the next several days Do not lift, push, pull anything more than 10 pounds for the next week  Follow-up instructions: Please follow-up with your primary care provider in the next 1 week for further evaluation of your symptoms.   Return instructions:  SEEK IMMEDIATE MEDICAL ATTENTION IF YOU HAVE: New numbness, tingling, weakness, or problem with the use of your arms or legs Severe back pain not relieved with medications Loss control of your bowels or bladder Increasing pain in any areas of the body (such as chest or abdominal pain) Shortness of breath, dizziness, or fainting.  Worsening nausea (feeling sick to your stomach), vomiting, fever, or sweats Any other emergent concerns regarding your health   Additional Information:  Your vital signs today were: BP 119/87 (BP Location: Left Arm)    Pulse 95    Temp 97.8 F (36.6 C) (Oral)    Resp 20    LMP 10/20/2016    SpO2 99%  If your blood pressure (BP) was elevated above 135/85 this visit, please have this repeated by your doctor within one month. --------------

## 2016-10-25 NOTE — ED Notes (Addendum)
Patient reports injuring lower back 45 minutes PTA while lifting boxes at home this morning.  Denies radiation of pain down leg.  Denies loss of bowel or bladder control.

## 2016-10-25 NOTE — ED Provider Notes (Signed)
[facility] DEPT Provider Note   CSN: [id number] Arrival date & time: 10/25/16  0847     History   Chief Complaint Chief Complaint  Patient presents with  . Back Pain    HPI [name] is a 45 y.o. female.  HPI  45 y.o. female, presents to the Emergency Department today complaining of low back pain on right side. Pt notes lifting christmas decorations earlier today without using legs. Hx same in past. Rates pain 8/10. Worse with movement. No N/V. No fever. No loss of bowel or bladder function. No saddle anesthesia. Able to ambulate without difficulty. No other symptoms noted.  Past Medical History:  Diagnosis Date  . Asthma   . COPD (chronic obstructive pulmonary disease) ([place])   . Irregular heart rate   . Renal disorder    kidney infections    There are no active problems to display for this patient.   Past Surgical History:  Procedure Laterality Date  . APPENDECTOMY    . TUBAL LIGATION      OB History    No data available       Home Medications    Prior to Admission medications   Medication Sig Start Date End Date Taking? Authorizing Provider  albuterol (PROVENTIL HFA;VENTOLIN HFA) 108 (90 BASE) MCG/ACT inhaler Inhale 2 puffs into the lungs every 6 (six) hours as needed for wheezing or shortness of breath. 09/03/15   Melony Overly, MD  cyclobenzaprine (FLEXERIL) 10 MG tablet Take 1 tablet (10 mg total) by mouth 2 (two) times daily as needed for muscle spasms. 03/24/16   Samantha Tripp Dowless, PA-C  diazepam (VALIUM) 5 MG tablet Take 1 tablet (5 mg total) by mouth every 8 (eight) hours as needed for muscle spasms. 10/27/15   Blanchie Dessert, MD  diphenhydramine-acetaminophen (TYLENOL PM) 25-500 MG TABS tablet Take 2 tablets by mouth at bedtime as needed (pain).    Historical Provider, MD  HYDROcodone-acetaminophen (NORCO/VICODIN) 5-325 MG tablet Take 1 tablet by mouth every 4 (four) hours as needed for severe pain. 05/09/16   Anne Ng, PA-C    HYDROcodone-acetaminophen (NORCO/VICODIN) 5-325 MG tablet Take 2 tablets by mouth every 4 (four) hours as needed. 07/21/16   Recardo Evangelist, PA-C  ibuprofen (ADVIL,MOTRIN) 600 MG tablet Take 1 tablet (600 mg total) by mouth every 6 (six) hours as needed. 10/25/16   Shary Decamp, PA-C  methocarbamol (ROBAXIN) 500 MG tablet Take 1 tablet (500 mg total) by mouth 2 (two) times daily. 10/25/16   Shary Decamp, PA-C  naproxen (NAPROSYN) 500 MG tablet Take 1 tablet (500 mg total) by mouth 2 (two) times daily. 09/15/16   Frederica Kuster, PA-C    Family History Family History  Problem Relation Age of Onset  . Hypertension Mother   . Hypertension Father     Social History Social History  Substance Use Topics  . Smoking status: Current Every Day Smoker    Packs/day: 1.50    Types: Cigarettes  . Smokeless tobacco: Never Used  . Alcohol use Yes     Comment: rare     Allergies   Bactrim [sulfamethoxazole-trimethoprim]   Review of Systems Review of Systems  Constitutional: Negative for fever.  Respiratory: Negative for shortness of breath.   Gastrointestinal: Negative for nausea and vomiting.  Musculoskeletal: Positive for back pain and myalgias.   Physical Exam Updated Vital Signs BP 119/87 (BP Location: Left Arm)   Pulse 95   Temp 97.8 F (36.6 C) (Oral)  Resp 20   LMP 10/20/2016   SpO2 99%   Physical Exam  Constitutional: She is oriented to person, place, and time. Vital signs are normal. She appears well-developed and well-nourished.  HENT:  Head: Normocephalic.  Right Ear: Hearing normal.  Left Ear: Hearing normal.  Eyes: Conjunctivae and EOM are normal. Pupils are equal, round, and reactive to light.  Cardiovascular: Normal rate, regular rhythm, normal heart sounds and intact distal pulses.   Pulmonary/Chest: Effort normal and breath sounds normal.  Musculoskeletal:  TTP right lower lumbar musculature. No spinous process tenderness. No palpable or visible deformities.    Neurological: She is alert and oriented to person, place, and time.  Skin: Skin is warm and dry.  Psychiatric: She has a normal mood and affect. Her speech is normal and behavior is normal. Thought content normal.  Nursing note and vitals reviewed.  ED Treatments / Results  Labs (all labs ordered are listed, but only abnormal results are displayed) Labs Reviewed - No data to display  EKG  EKG Interpretation None      Radiology No results found.  Procedures Procedures (including critical care time)  Medications Ordered in ED Medications  oxyCODONE-acetaminophen (PERCOCET/ROXICET) 5-325 MG per tablet 1 tablet (not administered)  methocarbamol (ROBAXIN) tablet 500 mg (not administered)   Initial Impression / Assessment and Plan / ED Course  I have reviewed the triage vital signs and the nursing notes.  Pertinent labs & imaging results that were available during my care of the patient were reviewed by me and considered in my medical decision making (see chart for details).  Clinical Course     Final Clinical Impressions(s) / ED Diagnoses     {I have reviewed the relevant previous healthcare records.  {I obtained HPI from historian.   ED Course:  Assessment: Patient is a 63yF who presents to the ED with back pain s/p lifting christmas decorations today. Pain on right lumbar musculature. No midline tenderness.. No neurological deficits appreciated. Patient is ambulatory. No warning symptoms of back pain including: fecal incontinence, urinary retention or overflow incontinence, night sweats, waking from sleep with back pain, unexplained fevers or weight loss, h/o cancer, IVDU, recent trauma. No concern for cauda equina, epidural abscess, or other serious cause of back pain. Conservative measures such as rest, ice/heat and pain medicine indicated with PCP follow-up if no improvement with conservative management.   Disposition/Plan:  DC Home Additional Verbal discharge  instructions given and discussed with patient.  Pt Instructed to f/u with PCP in the next week for evaluation and treatment of symptoms. Return precautions given Pt acknowledges and agrees with plan  Supervising Physician Sharlett Iles, MD  Final diagnoses:  Back strain, initial encounter    New Prescriptions New Prescriptions   IBUPROFEN (ADVIL,MOTRIN) 600 MG TABLET    Take 1 tablet (600 mg total) by mouth every 6 (six) hours as needed.   METHOCARBAMOL (ROBAXIN) 500 MG TABLET    Take 1 tablet (500 mg total) by mouth 2 (two) times daily.     Shary Decamp, PA-C 10/25/16 [place], MD 10/25/16 [phone]

## 2016-10-25 NOTE — ED Notes (Signed)
ED Provider at bedside. 

## 2016-10-27 ENCOUNTER — Encounter (HOSPITAL_COMMUNITY): Payer: Self-pay | Admitting: Emergency Medicine

## 2016-10-27 ENCOUNTER — Emergency Department (HOSPITAL_COMMUNITY)
Admission: EM | Admit: 2016-10-27 | Discharge: 2016-10-27 | Disposition: A | Discharge status: Home / Self Care | Payer: Medicaid Other | Attending: Emergency Medicine | Admitting: Emergency Medicine

## 2016-10-27 DIAGNOSIS — S39012D Strain of muscle, fascia and tendon of lower back, subsequent encounter: Secondary | ICD-10-CM | POA: Insufficient documentation

## 2016-10-27 DIAGNOSIS — X500XXD Overexertion from strenuous movement or load, subsequent encounter: Secondary | ICD-10-CM | POA: Insufficient documentation

## 2016-10-27 DIAGNOSIS — F17210 Nicotine dependence, cigarettes, uncomplicated: Secondary | ICD-10-CM | POA: Insufficient documentation

## 2016-10-27 DIAGNOSIS — Z79899 Other long term (current) drug therapy: Secondary | ICD-10-CM | POA: Insufficient documentation

## 2016-10-27 DIAGNOSIS — J449 Chronic obstructive pulmonary disease, unspecified: Secondary | ICD-10-CM | POA: Insufficient documentation

## 2016-10-27 MED ORDER — KETOROLAC TROMETHAMINE 60 MG/2ML IM SOLN
60.0000 mg | Freq: Once | INTRAMUSCULAR | Status: AC
  Administered 2016-10-27: 60 mg via INTRAMUSCULAR
  Filled 2016-10-27: qty 2

## 2016-10-27 NOTE — Discharge Instructions (Signed)
Please read and follow all provided instructions.  Your diagnoses today include:  1. Strain of lumbar region, subsequent encounter     Tests performed today include:  Vital signs - see below for your results today  Medications prescribed:   None  Take any prescribed medications only as directed.  Home care instructions:   Follow any educational materials contained in this packet  Please rest, use ice or heat on your back for the next several days  Do not lift, push, pull anything more than 10 pounds for the next week  Follow-up instructions: Please follow-up with your primary care provider in the next 1 week for further evaluation of your symptoms.   Return instructions:  SEEK IMMEDIATE MEDICAL ATTENTION IF YOU HAVE:  New numbness, tingling, weakness, or problem with the use of your arms or legs  Severe back pain not relieved with medications  Loss control of your bowels or bladder  Increasing pain in any areas of the body (such as chest or abdominal pain)  Shortness of breath, dizziness, or fainting.   Worsening nausea (feeling sick to your stomach), vomiting, fever, or sweats  Any other emergent concerns regarding your health   Additional Information:  Your vital signs today were: BP 138/98 (BP Location: Left Arm)    Pulse 102    Temp 98 F (36.7 C) (Oral)    Resp 18    Ht '5\' 8"'$  (1.727 m)    LMP 10/20/2016    SpO2 98%  If your blood pressure (BP) was elevated above 135/85 this visit, please have this repeated by your doctor within one month. --------------

## 2016-10-27 NOTE — ED Triage Notes (Signed)
Patient states that she was seen here in Friday for left lower back pain after picking up a box. Patient states that pain has been constant and not gotten any better.  Patient states pain is worse with movement.

## 2016-10-27 NOTE — ED Provider Notes (Signed)
[facility] DEPT Provider Note   CSN: [id number] Arrival date & time: 10/27/16  1135   By signing my name below, I, Dolores Hoose, attest that this documentation has been prepared under the direction and in the presence of non-physician practitioner, Alecia Lemming PA-C. Electronically Signed: Dolores Hoose, Scribe. 10/27/2016. 11:49 AM.   History   Chief Complaint Chief Complaint  Patient presents with  . Back Pain   HPI  HPI Comments:  [name] is a 45 y.o. female with pmhx of renal disorder who presents to the Emergency Department complaining of sudden-onset unchanged lower back pain beginning about 2 days ago. Pt was seen here in the ED 2 days ago for same and told to try ice, heat, and otc painkillers. She was given RX for Robaxin. She has been compliant with this treatment plan but has had no relief.  Pt states she tried going to work today but her pain was too great and so she returned to the ED. She denies any dysuria, trouble urinating, weakness, numbness or tingling.   Past Medical History:  Diagnosis Date  . Asthma   . COPD (chronic obstructive pulmonary disease) ([place])   . Irregular heart rate   . Renal disorder    kidney infections    There are no active problems to display for this patient.   Past Surgical History:  Procedure Laterality Date  . APPENDECTOMY    . TUBAL LIGATION      OB History    No data available       Home Medications    Prior to Admission medications   Medication Sig Start Date End Date Taking? Authorizing Provider  albuterol (PROVENTIL HFA;VENTOLIN HFA) 108 (90 BASE) MCG/ACT inhaler Inhale 2 puffs into the lungs every 6 (six) hours as needed for wheezing or shortness of breath. 09/03/15   Melony Overly, MD  cyclobenzaprine (FLEXERIL) 10 MG tablet Take 1 tablet (10 mg total) by mouth 2 (two) times daily as needed for muscle spasms. 03/24/16   Samantha Tripp Dowless, PA-C  diazepam (VALIUM) 5 MG tablet Take 1 tablet (5 mg total) by  mouth every 8 (eight) hours as needed for muscle spasms. 10/27/15   Blanchie Dessert, MD  diphenhydramine-acetaminophen (TYLENOL PM) 25-500 MG TABS tablet Take 2 tablets by mouth at bedtime as needed (pain).    Historical Provider, MD  HYDROcodone-acetaminophen (NORCO/VICODIN) 5-325 MG tablet Take 1 tablet by mouth every 4 (four) hours as needed for severe pain. 05/09/16   Anne Ng, PA-C  HYDROcodone-acetaminophen (NORCO/VICODIN) 5-325 MG tablet Take 2 tablets by mouth every 4 (four) hours as needed. 07/21/16   Recardo Evangelist, PA-C  ibuprofen (ADVIL,MOTRIN) 600 MG tablet Take 1 tablet (600 mg total) by mouth every 6 (six) hours as needed. 10/25/16   Shary Decamp, PA-C  methocarbamol (ROBAXIN) 500 MG tablet Take 1 tablet (500 mg total) by mouth 2 (two) times daily. 10/25/16   Shary Decamp, PA-C  naproxen (NAPROSYN) 500 MG tablet Take 1 tablet (500 mg total) by mouth 2 (two) times daily. 09/15/16   Frederica Kuster, PA-C    Family History Family History  Problem Relation Age of Onset  . Hypertension Mother   . Hypertension Father     Social History Social History  Substance Use Topics  . Smoking status: Current Every Day Smoker    Packs/day: 1.50    Types: Cigarettes  . Smokeless tobacco: Never Used  . Alcohol use Yes     Comment: rare  Allergies   Bactrim [sulfamethoxazole-trimethoprim]   Review of Systems Review of Systems  Constitutional: Negative for fever and unexpected weight change.  Gastrointestinal: Negative for constipation.       Negative for fecal incontinence.   Genitourinary: Negative for difficulty urinating, dysuria, flank pain, hematuria, pelvic pain, vaginal bleeding and vaginal discharge.       Negative for urinary incontinence or retention.  Musculoskeletal: Positive for back pain.  Neurological: Negative for weakness and numbness.       Denies saddle paresthesias.     Physical Exam Updated Vital Signs BP 138/98 (BP Location: Left Arm)   Pulse 102    Temp 98 F (36.7 C) (Oral)   Resp 18   Ht '5\' 8"'$  (1.727 m)   LMP 10/20/2016   SpO2 98%   Physical Exam  Constitutional: She appears well-developed and well-nourished.  HENT:  Head: Normocephalic and atraumatic.  Eyes: Conjunctivae are normal.  Neck: Normal range of motion. Neck supple.  Pulmonary/Chest: Effort normal.  Abdominal: Soft. There is no tenderness. There is no CVA tenderness.  Musculoskeletal: Normal range of motion.       Cervical back: Normal.       Thoracic back: Normal.       Lumbar back: She exhibits tenderness (left lumbar paraspinous). She exhibits normal range of motion and no bony tenderness.  No step-off noted with palpation of spine.   Neurological: She is alert. She has normal strength and normal reflexes. No sensory deficit.  5/5 strength in entire lower extremities bilaterally. No sensation deficit. Ambulatory without foot drop.  Skin: Skin is warm and dry. No rash noted.  Psychiatric: She has a normal mood and affect.  Nursing note and vitals reviewed.    ED Treatments / Results  DIAGNOSTIC STUDIES:  Oxygen Saturation is 98% on RA, normal by my interpretation.    COORDINATION OF CARE:  12:09 PM Discussed treatment plan with pt at bedside which includes toradol IM and pt agreed to plan.  Procedures Procedures (including critical care time)  Medications Ordered in ED Medications  ketorolac (TORADOL) injection 60 mg (60 mg Intramuscular Given 10/27/16 1227)     Initial Impression / Assessment and Plan / ED Course  I have reviewed the triage vital signs and the nursing notes.  Pertinent labs & imaging results that were available during my care of the patient were reviewed by me and considered in my medical decision making (see chart for details).  Clinical Course    Patient seen and examined. Work-up initiated. Medications ordered.   Vital signs reviewed and are as follows: Vitals:   10/27/16 1141  BP: 138/98  Pulse: 102  Resp: 18    Temp: 98 F (36.7 C)    No red flag s/s of low back pain. Patient was counseled on back pain precautions and told to do activity as tolerated but do not lift, push, or pull heavy objects more than 10 pounds for the next week.  Patient counseled to use ice or heat on back for no longer than 15 minutes every hour.   Patient urged to follow-up with PCP if pain does not improve with treatment and rest or if pain becomes recurrent. Urged to return with worsening severe pain, loss of bowel or bladder control, trouble walking.   The patient verbalizes understanding and agrees with the plan.    Final Clinical Impressions(s) / ED Diagnoses   Final diagnoses:  Strain of lumbar region, subsequent encounter   Patient with back pain  after lifting injury. Tried to work today, pain became worse. No neurological deficits. Patient is ambulatory. No warning symptoms of back pain including: fecal incontinence, urinary retention or overflow incontinence, night sweats, waking from sleep with back pain, unexplained fevers or weight loss, h/o cancer, IVDU, recent trauma. No concern for cauda equina, epidural abscess, or other serious cause of back pain. Conservative measures such as rest, ice/heat and pain medicine indicated with PCP follow-up if no improvement with conservative management.    New Prescriptions Current Discharge Medication List    I personally performed the services described in this documentation, which was scribed in my presence. The recorded information has been reviewed and is accurate.     Carlisle Cater, PA-C 10/27/16 [place], DO 10/27/16 2041

## 2016-11-06 ENCOUNTER — Emergency Department (HOSPITAL_COMMUNITY)
Admission: EM | Admit: 2016-11-06 | Discharge: 2016-11-06 | Disposition: A | Discharge status: Home / Self Care | Payer: Medicaid Other | Attending: Emergency Medicine | Admitting: Emergency Medicine

## 2016-11-06 ENCOUNTER — Encounter (HOSPITAL_COMMUNITY): Payer: Self-pay | Admitting: Emergency Medicine

## 2016-11-06 DIAGNOSIS — G8929 Other chronic pain: Secondary | ICD-10-CM | POA: Insufficient documentation

## 2016-11-06 DIAGNOSIS — M549 Dorsalgia, unspecified: Secondary | ICD-10-CM

## 2016-11-06 DIAGNOSIS — F17210 Nicotine dependence, cigarettes, uncomplicated: Secondary | ICD-10-CM | POA: Insufficient documentation

## 2016-11-06 DIAGNOSIS — M545 Low back pain: Secondary | ICD-10-CM | POA: Insufficient documentation

## 2016-11-06 DIAGNOSIS — Z79899 Other long term (current) drug therapy: Secondary | ICD-10-CM | POA: Insufficient documentation

## 2016-11-06 DIAGNOSIS — J449 Chronic obstructive pulmonary disease, unspecified: Secondary | ICD-10-CM | POA: Insufficient documentation

## 2016-11-06 MED ORDER — METHOCARBAMOL 500 MG PO TABS: ORAL_TABLET | ORAL | 0 refills | Status: DC

## 2016-11-06 MED ORDER — METHOCARBAMOL 500 MG PO TABS
1000.0000 mg | ORAL_TABLET | Freq: Once | ORAL | Status: AC
  Administered 2016-11-06: 1000 mg via ORAL
  Filled 2016-11-06: qty 2

## 2016-11-06 MED ORDER — LIDOCAINE 4 % EX CREA: 1.0000 "application " | TOPICAL_CREAM | Freq: Three times a day (TID) | CUTANEOUS | 1 refills | Status: DC | PRN

## 2016-11-06 MED ORDER — KETOROLAC TROMETHAMINE 60 MG/2ML IM SOLN
30.0000 mg | Freq: Once | INTRAMUSCULAR | Status: AC
  Administered 2016-11-06: 30 mg via INTRAMUSCULAR
  Filled 2016-11-06: qty 2

## 2016-11-06 NOTE — Discharge Instructions (Signed)
For pain control you may take up to '800mg'$  of Motrin (also known as ibuprofen). That is usually 4 over the counter pills,  3 times a day. Take with food to minimize stomach irritation   You can also take  tylenol (acetaminophen) '975mg'$  (this is 3 over the counter pills) four times a day. Do not drink alcohol or combine with other medications that have acetaminophen as an ingredient (Read the labels!).    For breakthrough pain you may take Robaxin. Do not drink alcohol, drive or operate heavy machinery when taking Robaxin.  Do not hesitate to return to the emergency room for any new, worsening or concerning symptoms.  Please obtain primary care using resource guide below. Let them know that you were seen in the emergency room and that they will need to obtain records for further outpatient management.

## 2016-11-06 NOTE — ED Provider Notes (Signed)
[facility] DEPT Provider Note   CSN: [id number] Arrival date & time: 11/06/16  1025     History   Chief Complaint Chief Complaint  Patient presents with  . Back Pain    HPI   Blood pressure 145/95, pulse 114, temperature 98.1 F (36.7 C), temperature source Oral, resp. rate 18, last menstrual period 10/20/2016, SpO2 100 %.  [name] is a 45 y.o. female complaining of acute exacerbation of chronic low back pain after lifting and twisting several weeks ago. Pain is severe and exacerbated by movement and palpation, it radiates down the posterior of the left leg. She took Tylenol PM last night with little relief. Denies fever, chills, change in bowel or bladder habits, h/o IDVU or cancer, numbness or weakness.    Past Medical History:  Diagnosis Date  . Asthma   . COPD (chronic obstructive pulmonary disease) ([place])   . Irregular heart rate   . Renal disorder    kidney infections    There are no active problems to display for this patient.   Past Surgical History:  Procedure Laterality Date  . APPENDECTOMY    . TUBAL LIGATION      OB History    No data available       Home Medications    Prior to Admission medications   Medication Sig Start Date End Date Taking? Authorizing Provider  albuterol (PROVENTIL HFA;VENTOLIN HFA) 108 (90 BASE) MCG/ACT inhaler Inhale 2 puffs into the lungs every 6 (six) hours as needed for wheezing or shortness of breath. 09/03/15   Melony Overly, MD  diphenhydramine-acetaminophen (TYLENOL PM) 25-500 MG TABS tablet Take 2 tablets by mouth at bedtime as needed (pain).    Historical Provider, MD  ibuprofen (ADVIL,MOTRIN) 600 MG tablet Take 1 tablet (600 mg total) by mouth every 6 (six) hours as needed. 10/25/16   Shary Decamp, PA-C  lidocaine (LMX) 4 % cream Apply 1 application topically 3 (three) times daily as needed. 11/06/16   Eimy Plaza, PA-C  methocarbamol (ROBAXIN) 500 MG tablet Can take up to 1-2 tabs every 6 hours PRN PAIN  11/06/16   Monico Blitz, PA-C    Family History Family History  Problem Relation Age of Onset  . Hypertension Mother   . Hypertension Father     Social History Social History  Substance Use Topics  . Smoking status: Current Every Day Smoker    Packs/day: 1.50    Types: Cigarettes  . Smokeless tobacco: Never Used  . Alcohol use Yes     Comment: rare     Allergies   Bactrim [sulfamethoxazole-trimethoprim]   Review of Systems Review of Systems  10 systems reviewed and found to be negative, except as noted in the HPI.   Physical Exam Updated Vital Signs BP 145/95 (BP Location: Right Arm)   Pulse 114   Temp 98.1 F (36.7 C) (Oral)   Resp 18   LMP 10/20/2016   SpO2 100%   Physical Exam  Constitutional: She is oriented to person, place, and time. She appears well-developed and well-nourished. No distress.  HENT:  Head: Normocephalic and atraumatic.  Mouth/Throat: Oropharynx is clear and moist.  Eyes: Conjunctivae and EOM are normal. Pupils are equal, round, and reactive to light.  Neck: Normal range of motion.  Cardiovascular: Normal rate, regular rhythm and intact distal pulses.   Pulmonary/Chest: Effort normal and breath sounds normal.  Abdominal: Soft. There is no tenderness.  Musculoskeletal: Normal range of motion. She exhibits tenderness.  Neurological:  She is alert and oriented to person, place, and time.  No point tenderness to percussion of lumbar spinal processes.  ++ Tender to palpation of the left lumbar paraspinal musculature. Strength is 5 out of 5 to bilateral lower extremities at hip and knee; extensor hallucis longus 5 out of 5. Ankle strength 5 out of 5, no clonus, neurovascularly intact. No saddle anaesthesia. Patellar reflexes are 2+ bilaterally.    Straight leg raise is positive on the left side at about 30. Negative on the contralateral side.  Skin: She is not diaphoretic.  Psychiatric: She has a normal mood and affect.  Nursing note and  vitals reviewed.    ED Treatments / Results  Labs (all labs ordered are listed, but only abnormal results are displayed) Labs Reviewed - No data to display  EKG  EKG Interpretation None       Radiology No results found.  Procedures Procedures (including critical care time)  Medications Ordered in ED Medications  ketorolac (TORADOL) injection 30 mg (not administered)  methocarbamol (ROBAXIN) tablet 1,000 mg (not administered)     Initial Impression / Assessment and Plan / ED Course  I have reviewed the triage vital signs and the nursing notes.  Pertinent labs & imaging results that were available during my care of the patient were reviewed by me and considered in my medical decision making (see chart for details).  Clinical Course    Vitals:   11/06/16 0929  BP: 145/95  Pulse: 114  Resp: 18  Temp: 98.1 F (36.7 C)  TempSrc: Oral  SpO2: 100%    Medications  ketorolac (TORADOL) injection 30 mg (not administered)  methocarbamol (ROBAXIN) tablet 1,000 mg (not administered)    [name] is 45 y.o. female presenting with Exacerbation of chronic low back pain, neurologic exam is nonfocal, patient afebrile and overall well appearing, initially tachycardic however this is resolved on my exam. No signs of cauda equina, spinal epidural abscess, discitis. This appears to be a lumbar radiculopathy, unfortunately this patient does not have good outpatient follow-up. Case management consulted to help her obtain primary care.  Evaluation does not show pathology that would require ongoing emergent intervention or inpatient treatment. Pt is hemodynamically stable and mentating appropriately. Discussed findings and plan with patient/guardian, who agrees with care plan. All questions answered. Return precautions discussed and outpatient follow up given.     Final Clinical Impressions(s) / ED Diagnoses   Final diagnoses:  Exacerbation of chronic back pain    New  Prescriptions New Prescriptions   LIDOCAINE (LMX) 4 % CREAM    Apply 1 application topically 3 (three) times daily as needed.   METHOCARBAMOL (ROBAXIN) 500 MG TABLET    Can take up to 1-2 tabs every 6 hours PRN PAIN     Monico Blitz, PA-C 11/06/16 [place], MD 11/10/16 1524

## 2016-11-06 NOTE — ED Triage Notes (Signed)
Per pt, states she pulled lower back while lifting a box 3 weeks ago-has been seen twice for incident-states now having left left numbness- has not follow up with Neuro/ortho

## 2016-11-06 NOTE — Progress Notes (Addendum)
CM spoke with pt who confirms uninsured [employer] resident with no pcp.  CM discussed and provided written information to assist pt with determining choice for uninsured accepting pcps, discussed the importance of pcp vs EDP services for f/u care, www.needymeds.org, www.goodrx.com, discounted pharmacies and other [employer] such as [employer] [facility] [employer], affordable care act, financial assistance, uninsured dental services, [place] med assist, DSS and  health department  Reviewed resources for [employer] uninsured accepting pcps like [facility], family medicine at [address], [facility], [facility], local urgent care centers, [facility], [facility] practice, general medical clinics, family services of the [place], [facility] urgent care plus others, medication resources, [facility] out patient pharmacies and housing Pt voiced understanding and appreciation of resources provided   Provided [facility] contact information Discuss decrease in [facility] slots Encouraged to go to DSS to discuss present coverage to confirm or deny beneficial for her or not and contact [facility] and overflow [facility] for appt Teach back method used and pt repeated instructions back appropriately Female visitor at bedside and sitting on bed with pt when CM arrived Laughed at pt during assessment and teach back method process Pt noted tearful x 2 Pt given encouragement to comply and she was appreciative of encouragement and resources offered

## 2016-11-06 NOTE — ED Notes (Signed)
Discharge instructions, follow up care, and rx x2 reviewed with patient. Patient verbalized understanding. 

## 2017-01-12 ENCOUNTER — Encounter (HOSPITAL_COMMUNITY): Payer: Self-pay | Admitting: Emergency Medicine

## 2017-01-12 ENCOUNTER — Emergency Department (HOSPITAL_COMMUNITY)
Admission: EM | Admit: 2017-01-12 | Discharge: 2017-01-12 | Disposition: A | Discharge status: Home / Self Care | Payer: Self-pay | Attending: Emergency Medicine | Admitting: Emergency Medicine

## 2017-01-12 ENCOUNTER — Emergency Department (HOSPITAL_COMMUNITY): Payer: Self-pay

## 2017-01-12 DIAGNOSIS — M5442 Lumbago with sciatica, left side: Secondary | ICD-10-CM | POA: Insufficient documentation

## 2017-01-12 DIAGNOSIS — G8929 Other chronic pain: Secondary | ICD-10-CM

## 2017-01-12 DIAGNOSIS — F17210 Nicotine dependence, cigarettes, uncomplicated: Secondary | ICD-10-CM | POA: Insufficient documentation

## 2017-01-12 DIAGNOSIS — Z79899 Other long term (current) drug therapy: Secondary | ICD-10-CM | POA: Insufficient documentation

## 2017-01-12 DIAGNOSIS — J449 Chronic obstructive pulmonary disease, unspecified: Secondary | ICD-10-CM | POA: Insufficient documentation

## 2017-01-12 MED ORDER — ACETAMINOPHEN 325 MG PO TABS
650.0000 mg | ORAL_TABLET | Freq: Once | ORAL | Status: AC
  Administered 2017-01-12: 650 mg via ORAL
  Filled 2017-01-12: qty 2

## 2017-01-12 MED ORDER — METHOCARBAMOL 500 MG PO TABS: 500.0000 mg | ORAL_TABLET | Freq: Three times a day (TID) | ORAL | 0 refills | Status: DC | PRN

## 2017-01-12 MED ORDER — KETOROLAC TROMETHAMINE 60 MG/2ML IM SOLN
60.0000 mg | Freq: Once | INTRAMUSCULAR | Status: AC
  Administered 2017-01-12: 60 mg via INTRAMUSCULAR
  Filled 2017-01-12: qty 2

## 2017-01-12 MED ORDER — METHOCARBAMOL 500 MG PO TABS
500.0000 mg | ORAL_TABLET | Freq: Once | ORAL | Status: AC
  Administered 2017-01-12: 500 mg via ORAL
  Filled 2017-01-12: qty 1

## 2017-01-12 MED ORDER — HYDROCODONE-ACETAMINOPHEN 5-325 MG PO TABS
1.0000 | ORAL_TABLET | Freq: Once | ORAL | Status: AC
  Administered 2017-01-12: 1 via ORAL
  Filled 2017-01-12: qty 1

## 2017-01-12 NOTE — ED Notes (Signed)
Bed: WA20 Expected date:  Expected time:  Means of arrival:  Comments: 

## 2017-01-12 NOTE — ED Triage Notes (Signed)
Per pt, states she has chronic lower back pain-states she lost her balance yesterday and is now having left lower back pain radiating down left leg

## 2017-01-12 NOTE — ED Provider Notes (Signed)
[facility] DEPT Provider Note   CSN: [id number] Arrival date & time: 01/12/17  8185     History   Chief Complaint Chief Complaint  Patient presents with  . Leg Pain    HPI [name] is a 46 y.o. female.  HPI Patient presents emergency department ongoing recurrent pain in her low back with radiation towards her left gluteal region and shoots down towards her left knee.  No new weakness of her left lower extremity.  She's had recurrent issues and does not have a primary care doctor.  She's had 6 visits to this emergency department past 6 months with similar complaints.  She tried ibuprofen last night without improvement in her symptoms.     Past Medical History:  Diagnosis Date  . Asthma   . COPD (chronic obstructive pulmonary disease) ([place])   . Irregular heart rate   . Renal disorder    kidney infections    There are no active problems to display for this patient.   Past Surgical History:  Procedure Laterality Date  . APPENDECTOMY    . TUBAL LIGATION      OB History    No data available       Home Medications    Prior to Admission medications   Medication Sig Start Date End Date Taking? Authorizing Provider  albuterol (PROVENTIL HFA;VENTOLIN HFA) 108 (90 BASE) MCG/ACT inhaler Inhale 2 puffs into the lungs every 6 (six) hours as needed for wheezing or shortness of breath. 09/03/15   Melony Overly, MD  diphenhydramine-acetaminophen (TYLENOL PM) 25-500 MG TABS tablet Take 2 tablets by mouth at bedtime as needed (pain).    Historical Provider, MD  ibuprofen (ADVIL,MOTRIN) 600 MG tablet Take 1 tablet (600 mg total) by mouth every 6 (six) hours as needed. 10/25/16   Shary Decamp, PA-C  lidocaine (LMX) 4 % cream Apply 1 application topically 3 (three) times daily as needed. 11/06/16   Nicole Pisciotta, PA-C  methocarbamol (ROBAXIN) 500 MG tablet Can take up to 1-2 tabs every 6 hours PRN PAIN 11/06/16   Monico Blitz, PA-C    Family History Family History    Problem Relation Age of Onset  . Hypertension Mother   . Hypertension Father     Social History Social History  Substance Use Topics  . Smoking status: Current Every Day Smoker    Packs/day: 1.50    Types: Cigarettes  . Smokeless tobacco: Never Used  . Alcohol use Yes     Comment: rare     Allergies   Bactrim [sulfamethoxazole-trimethoprim]   Review of Systems Review of Systems  All other systems reviewed and are negative.    Physical Exam Updated Vital Signs BP 134/96 (BP Location: Left Arm)   Pulse (!) 137   Temp 97.5 F (36.4 C) (Oral)   Resp 16   Ht '5\' 8"'$  (1.727 m)   Wt 175 lb (79.4 kg)   LMP 01/06/2017   SpO2 95%   BMI 26.61 kg/m   Physical Exam  Constitutional: She is oriented to person, place, and time. She appears well-developed and well-nourished.  HENT:  Head: Normocephalic.  Eyes: EOM are normal.  Neck: Normal range of motion.  Pulmonary/Chest: Effort normal.  Abdominal: She exhibits no distension.  Musculoskeletal: Normal range of motion.  Full range of motion of left hip, left knee, left ankle.  Normal pulses in the left foot.  Normal strength in the left lower extremity.  Tenderness in the left sciatic groove.  Mild  tenderness of the lumbar spine and paralumbar muscles  Neurological: She is alert and oriented to person, place, and time.  Psychiatric: She has a normal mood and affect.  Nursing note and vitals reviewed.    ED Treatments / Results  Labs (all labs ordered are listed, but only abnormal results are displayed) Labs Reviewed - No data to display  EKG  EKG Interpretation None       Radiology Dg Lumbar Spine Complete  Result Date: 01/12/2017 CLINICAL DATA:  Lumbago with left-sided radicular symptoms EXAM: LUMBAR SPINE - COMPLETE 4+ VIEW COMPARISON:  May 29, 2014 FINDINGS: Frontal, lateral, spot lumbosacral lateral, bilateral oblique views were obtained. There are 5 non-rib-bearing lumbar type vertebral bodies. There is no  fracture or spondylolisthesis. The disc spaces appear normal. There is no appreciable facet arthropathy. IMPRESSION: No fracture or spondylolisthesis.  No evident arthropathy. Electronically Signed   By: Lowella Grip III M.D.   On: 01/12/2017 10:06    Procedures Procedures (including critical care time)  Medications Ordered in ED Medications  ketorolac (TORADOL) injection 60 mg (60 mg Intramuscular Given 01/12/17 0930)  methocarbamol (ROBAXIN) tablet 500 mg (500 mg Oral Given 01/12/17 0930)  HYDROcodone-acetaminophen (NORCO/VICODIN) 5-325 MG per tablet 1 tablet (1 tablet Oral Given 01/12/17 0930)  acetaminophen (TYLENOL) tablet 650 mg (650 mg Oral Given 01/12/17 0944)     Initial Impression / Assessment and Plan / ED Course  I have reviewed the triage vital signs and the nursing notes.  Pertinent labs & imaging results that were available during my care of the patient were reviewed by me and considered in my medical decision making (see chart for details).     Symptom control emergency department.  Recurrent low back pain.  Patient will need a primary care doctor and likely a pain specialist.  She understands that she will not be discharged home with opioid pain medications.  Home with anti-inflammatories and muscle relaxants.  Final Clinical Impressions(s) / ED Diagnoses   Final diagnoses:  Chronic left-sided low back pain with left-sided sciatica    New Prescriptions New Prescriptions   METHOCARBAMOL (ROBAXIN) 500 MG TABLET    Take 1 tablet (500 mg total) by mouth every 8 (eight) hours as needed for muscle spasms.     Jola Schmidt, MD 01/12/17 1048

## 2017-02-11 ENCOUNTER — Encounter (HOSPITAL_COMMUNITY): Payer: Self-pay | Admitting: *Deleted

## 2017-02-11 DIAGNOSIS — M545 Low back pain: Secondary | ICD-10-CM | POA: Insufficient documentation

## 2017-02-11 DIAGNOSIS — Z5321 Procedure and treatment not carried out due to patient leaving prior to being seen by health care provider: Secondary | ICD-10-CM | POA: Insufficient documentation

## 2017-02-11 NOTE — ED Triage Notes (Addendum)
Pt complains of worsening back pain and left leg tinglingfor the past 2 days. Pt states pain is worse with movement. Pt states she has a "knot" in her lower back for the past 6 months. Pt tried to get into a clinic but states she could not wait long enough.  Pt is tachycardic in triage. Pt states she has an irregular heart rate and has increase in hr with pain.

## 2017-02-11 NOTE — ED Notes (Signed)
HR rechecked; HR 133; pt crying and in obvious discomfort

## 2017-02-12 ENCOUNTER — Emergency Department (HOSPITAL_COMMUNITY)
Admission: EM | Admit: 2017-02-12 | Discharge: 2017-02-12 | Disposition: A | Discharge status: Home / Self Care | Payer: Medicaid Other | Attending: Dermatology | Admitting: Dermatology

## 2017-02-12 NOTE — ED Notes (Signed)
Pt called for triage without answer

## 2017-02-18 ENCOUNTER — Ambulatory Visit: Payer: Self-pay | Attending: Internal Medicine | Admitting: Physician Assistant

## 2017-02-18 VITALS — BP 118/86 | HR 126 | Temp 97.8°F | Wt 174.4 lb

## 2017-02-18 DIAGNOSIS — J449 Chronic obstructive pulmonary disease, unspecified: Secondary | ICD-10-CM | POA: Insufficient documentation

## 2017-02-18 DIAGNOSIS — F17210 Nicotine dependence, cigarettes, uncomplicated: Secondary | ICD-10-CM | POA: Insufficient documentation

## 2017-02-18 DIAGNOSIS — Z882 Allergy status to sulfonamides status: Secondary | ICD-10-CM | POA: Insufficient documentation

## 2017-02-18 DIAGNOSIS — M5442 Lumbago with sciatica, left side: Secondary | ICD-10-CM

## 2017-02-18 DIAGNOSIS — M5440 Lumbago with sciatica, unspecified side: Secondary | ICD-10-CM | POA: Insufficient documentation

## 2017-02-18 DIAGNOSIS — F17200 Nicotine dependence, unspecified, uncomplicated: Secondary | ICD-10-CM

## 2017-02-18 DIAGNOSIS — Z79899 Other long term (current) drug therapy: Secondary | ICD-10-CM | POA: Insufficient documentation

## 2017-02-18 DIAGNOSIS — R000 Tachycardia, unspecified: Secondary | ICD-10-CM | POA: Insufficient documentation

## 2017-02-18 DIAGNOSIS — G8929 Other chronic pain: Secondary | ICD-10-CM | POA: Insufficient documentation

## 2017-02-18 MED ORDER — HYDROCODONE-ACETAMINOPHEN 10-325 MG PO TABS: 1.0000 | ORAL_TABLET | Freq: Three times a day (TID) | ORAL | 0 refills | Status: DC | PRN

## 2017-02-18 MED ORDER — METHOCARBAMOL 500 MG PO TABS: 500.0000 mg | ORAL_TABLET | Freq: Three times a day (TID) | ORAL | 0 refills | Status: DC | PRN

## 2017-02-18 MED ORDER — GABAPENTIN 300 MG PO CAPS: 300.0000 mg | ORAL_CAPSULE | Freq: Two times a day (BID) | ORAL | 0 refills | Status: DC

## 2017-02-18 NOTE — Progress Notes (Signed)
Pain level 8 for back, left leg numbness and burning behind knee.

## 2017-02-18 NOTE — Progress Notes (Signed)
[name]  [id number]:[id number]  [id number]  DOB - [birth year]  Chief Complaint  Patient presents with  . Hospitalization Follow-up       Subjective:   [name] is a 46 y.o. female here today for establishment of care.   Medical history of chronic low back pain, asthma, COPD and continues to smoke. She has had 6 emergency department visits in the last 6 months with back pain. She states that her initial injury was in November and she pulled a muscle. She now is having radiation down her left leg and left buttock. Her most recent imaging was in February 2018 and at that time an x-ray of her spine showed no fracture nor spondylo-listhesis nor arthropathy.  On 01/12/2017 during the emergency room department visit she was noted to be tachycardic with heart rates in the 130s. She was treated with Robaxin, Toradol and Norco with temporary relief. She was sent home with anti-inflammatories and muscle relaxers. All these things give her temporary relief. Heat helps some. She's not really seeking narcotics but is wanting something for pain. I reassured her that her imaging was most recently within normal limits. She's having difficulty sleeping. She's having difficulty getting comfortable. She is out of work right now. She smokes one half pack per day of cigarettes.   ROS: GEN: denies fever or chills, denies change in weight Skin: denies lesions or rashes HEENT: denies headache, earache, epistaxis, sore throat, or neck pain LUNGS: denies SHOB, dyspnea, PND, orthopnea CV: denies CP no palpitations ABD: denies abd pain, N or V EXT: denies muscle spasms or swelling; no pain in lower ext, no weakness NEURO: + numbness or tingling, denies sz, stroke or TIA; back pain with radiation down left leg   ALLERGIES: Allergies  Allergen Reactions  . Bactrim [Sulfamethoxazole-Trimethoprim] Nausea Only    PAST MEDICAL HISTORY: Past Medical History:  Diagnosis Date  . Asthma   . COPD (chronic  obstructive pulmonary disease) ([place])   . Irregular heart rate   . Renal disorder    kidney infections    PAST SURGICAL HISTORY: Past Surgical History:  Procedure Laterality Date  . APPENDECTOMY    . TUBAL LIGATION      MEDICATIONS AT HOME: Prior to Admission medications   Medication Sig Start Date End Date Taking? Authorizing Provider  albuterol (PROVENTIL HFA;VENTOLIN HFA) 108 (90 BASE) MCG/ACT inhaler Inhale 2 puffs into the lungs every 6 (six) hours as needed for wheezing or shortness of breath. 09/03/15  Yes Melony Overly, MD  diphenhydramine-acetaminophen (TYLENOL PM) 25-500 MG TABS tablet Take 2 tablets by mouth at bedtime as needed (pain).   Yes Historical Provider, MD  ibuprofen (ADVIL,MOTRIN) 600 MG tablet Take 1 tablet (600 mg total) by mouth every 6 (six) hours as needed. 10/25/16  Yes Shary Decamp, PA-C  lidocaine (LMX) 4 % cream Apply 1 application topically 3 (three) times daily as needed. 11/06/16  Yes Nicole Pisciotta, PA-C  gabapentin (NEURONTIN) 300 MG capsule Take 1 capsule (300 mg total) by mouth 2 (two) times daily. 02/18/17   Ramel Tobon Daneil Dan, PA-C  HYDROcodone-acetaminophen (NORCO) 10-325 MG tablet Take 1 tablet by mouth every 8 (eight) hours as needed. 02/18/17   Balian Schaller Daneil Dan, PA-C  methocarbamol (ROBAXIN) 500 MG tablet Take 1 tablet (500 mg total) by mouth every 8 (eight) hours as needed for muscle spasms. 02/18/17   Brayton Caves, PA-C    Family History  Problem Relation Age of Onset  . Hypertension Mother   .  Hypertension Father     '@SOCIALHX'$ @  Objective:   Vitals:   02/18/17 0858  BP: 118/86  Pulse: (!) 126  Temp: 97.8 F (36.6 C)  TempSrc: Other (Comment)  SpO2: 95%  Weight: 174 lb 6.4 oz (79.1 kg)    Exam General appearance : Awake, alert, not in any distress. Speech Clear. Not toxic looking HEENT: Atraumatic and Normocephalic, pupils equally reactive to light and accomodation but exophthalmos bilat Neck: supple, no JVD. No cervical  lymphadenopathy.  Chest:Good air entry bilaterally, no added sounds  CVS: S1 S2 regular, no murmurs.  Abdomen: Bowel sounds present, Non tender and not distended with no guarding, rigidity or rebound. Extremities: B/L Lower Ext shows no edema, both legs are warm to touch Neurology: Awake alert, and oriented X 3, CN II-XII intact, Non focal Skin:No Rash Wounds:N/A  Data Review No results found for: HGBA1C   Assessment & Plan  1. Acute on chronic low back pain with sciatica  -imaging reassuring  -Muscle relaxers, antiinflammatories, Neurontin, Norco  -considered additional imaging  -may need pain clinic referral or neurosurg at some point 2. Tachycardia (may be all from pain)  -CBC, BMP, TSH 3. Smoker/COPD  -cessation discussed, she is not ready to quit   Financial counselor Return in about 1 week (around 02/25/2017).  The patient was given clear instructions to go to ER or return to medical center if symptoms don't improve, worsen or new problems develop. The patient verbalized understanding. The patient was told to call to get lab results if they haven't heard anything in the next week.   Total time spent with patient was 29 min. Greater than 50 % of this visit was spent face to face counseling and coordinating care regarding risk factor modification, compliance importance and encouragement, education related to back pain and treatment options.  This note has been created with Surveyor, quantity. Any transcriptional errors are unintentional.    Zettie Pho, PA-C [facility] [place], [place]   02/18/2017, 9:26 AM

## 2017-02-19 LAB — CBC WITH DIFFERENTIAL/PLATELET
BASOS: 0 %
Basophils Absolute: 0 10*3/uL (ref 0.0–0.2)
EOS (ABSOLUTE): 0.1 10*3/uL (ref 0.0–0.4)
Eos: 1 %
Hematocrit: 44.9 % (ref 34.0–46.6)
Hemoglobin: 14.6 g/dL (ref 11.1–15.9)
IMMATURE GRANS (ABS): 0.1 10*3/uL (ref 0.0–0.1)
Immature Granulocytes: 1 %
LYMPHS ABS: 3.2 10*3/uL — AB (ref 0.7–3.1)
Lymphs: 21 %
MCH: 36.7 pg — AB (ref 26.6–33.0)
MCHC: 32.5 g/dL (ref 31.5–35.7)
MCV: 113 fL — AB (ref 79–97)
MONOS ABS: 0.9 10*3/uL (ref 0.1–0.9)
Monocytes: 6 %
NEUTROS ABS: 11 10*3/uL — AB (ref 1.4–7.0)
Neutrophils: 71 %
PLATELETS: 461 10*3/uL — AB (ref 150–379)
RBC: 3.98 x10E6/uL (ref 3.77–5.28)
RDW: 13.4 % (ref 12.3–15.4)
WBC: 15.3 10*3/uL — ABNORMAL HIGH (ref 3.4–10.8)

## 2017-02-19 LAB — BASIC METABOLIC PANEL
BUN / CREAT RATIO: 20 (ref 9–23)
BUN: 11 mg/dL (ref 6–24)
CO2: 15 mmol/L — ABNORMAL LOW (ref 18–29)
Calcium: 9.5 mg/dL (ref 8.7–10.2)
Chloride: 105 mmol/L (ref 96–106)
Creatinine, Ser: 0.56 mg/dL — ABNORMAL LOW (ref 0.57–1.00)
GFR, EST AFRICAN AMERICAN: 130 mL/min/{1.73_m2} (ref >59)
GFR, EST NON AFRICAN AMERICAN: 113 mL/min/{1.73_m2} (ref >59)
Glucose: 94 mg/dL (ref 65–99)
POTASSIUM: 3.9 mmol/L (ref 3.5–5.2)
SODIUM: 139 mmol/L (ref 134–144)

## 2017-02-19 LAB — TSH: TSH: 0.744 u[IU]/mL (ref 0.450–4.500)

## 2017-02-28 ENCOUNTER — Ambulatory Visit: Payer: Self-pay | Admitting: Family Medicine

## 2017-03-13 ENCOUNTER — Ambulatory Visit: Payer: Self-pay | Attending: Family Medicine | Admitting: Family Medicine

## 2017-03-13 VITALS — BP 132/82 | HR 121 | Temp 98.0°F | Resp 18 | Ht 68.0 in | Wt 180.2 lb

## 2017-03-13 DIAGNOSIS — M5442 Lumbago with sciatica, left side: Secondary | ICD-10-CM | POA: Insufficient documentation

## 2017-03-13 DIAGNOSIS — G8929 Other chronic pain: Secondary | ICD-10-CM

## 2017-03-13 DIAGNOSIS — Z79899 Other long term (current) drug therapy: Secondary | ICD-10-CM | POA: Insufficient documentation

## 2017-03-13 DIAGNOSIS — F17210 Nicotine dependence, cigarettes, uncomplicated: Secondary | ICD-10-CM | POA: Insufficient documentation

## 2017-03-13 DIAGNOSIS — Z8709 Personal history of other diseases of the respiratory system: Secondary | ICD-10-CM

## 2017-03-13 DIAGNOSIS — F4323 Adjustment disorder with mixed anxiety and depressed mood: Secondary | ICD-10-CM

## 2017-03-13 MED ORDER — IBUPROFEN 600 MG PO TABS: 600.0000 mg | ORAL_TABLET | Freq: Four times a day (QID) | ORAL | 0 refills | Status: DC | PRN

## 2017-03-13 MED ORDER — TRAMADOL HCL 50 MG PO TABS: 50.0000 mg | ORAL_TABLET | Freq: Three times a day (TID) | ORAL | 0 refills | Status: DC | PRN

## 2017-03-13 MED ORDER — GABAPENTIN 300 MG PO CAPS: 300.0000 mg | ORAL_CAPSULE | Freq: Two times a day (BID) | ORAL | 0 refills | Status: DC

## 2017-03-13 MED ORDER — ALBUTEROL SULFATE HFA 108 (90 BASE) MCG/ACT IN AERS: 2.0000 | INHALATION_SPRAY | Freq: Four times a day (QID) | RESPIRATORY_TRACT | 2 refills | Status: DC | PRN

## 2017-03-13 MED ORDER — METHOCARBAMOL 500 MG PO TABS: 500.0000 mg | ORAL_TABLET | Freq: Three times a day (TID) | ORAL | 0 refills | Status: DC | PRN

## 2017-03-13 NOTE — Progress Notes (Signed)
Subjective:  Patient ID: [name], female    DOB: 1971-06-08  Age: 46 y.o. MRN: [id number]  CC: Establish Care   HPI TAIWANA WILLISON presents for   Chronic lower back pain: History of chronic lower back pain with left sided sciatica.Most recent imaging in Feb. 2018 showed no fractures or spondylolisthesis of the vertebrae. Due to the chronic nature of her back symptoms it was suggested she be referred to pain specialist if symptoms persist. Reports being followed by orthopedic specialist several years ago. Denies any history of back injury. Reports pain is worse in the am. She denies any bowel or bladder incontinence.Reports using pain medications and heating pad for symptoms with minimal relief of symptoms.  Asthma/COPD: History of asthma and COPD. Denies any SOB or wheezing. She is a current everyday smoker reports smoking cigarettes, 1 1/2 day. She is not ready to quit at this time.    Outpatient Medications Prior to Visit  Medication Sig Dispense Refill  . diphenhydramine-acetaminophen (TYLENOL PM) 25-500 MG TABS tablet Take 2 tablets by mouth at bedtime as needed (pain).    Marland Kitchen HYDROcodone-acetaminophen (NORCO) 10-325 MG tablet Take 1 tablet by mouth every 8 (eight) hours as needed. 30 tablet 0  . albuterol (PROVENTIL HFA;VENTOLIN HFA) 108 (90 BASE) MCG/ACT inhaler Inhale 2 puffs into the lungs every 6 (six) hours as needed for wheezing or shortness of breath. 1 Inhaler 2  . gabapentin (NEURONTIN) 300 MG capsule Take 1 capsule (300 mg total) by mouth 2 (two) times daily. 60 capsule 0  . ibuprofen (ADVIL,MOTRIN) 600 MG tablet Take 1 tablet (600 mg total) by mouth every 6 (six) hours as needed. 30 tablet 0  . methocarbamol (ROBAXIN) 500 MG tablet Take 1 tablet (500 mg total) by mouth every 8 (eight) hours as needed for muscle spasms. 60 tablet 0  . lidocaine (LMX) 4 % cream Apply 1 application topically 3 (three) times daily as needed. (Patient not taking: Reported on 03/13/2017) 133 g 1    No facility-administered medications prior to visit.     ROS Review of Systems  Constitutional: Negative.   Respiratory: Negative.   Cardiovascular: Negative.   Gastrointestinal: Negative.   Musculoskeletal: Positive for back pain (chronic).  Skin: Negative.   Neurological: Positive for numbness (Left leg paresthesias.).   Objective:  BP 132/82 (BP Location: Left Arm, Patient Position: Sitting, Cuff Size: Normal)   Pulse (!) 121   Temp 98 F (36.7 C) (Oral)   Resp 18   Ht '5\' 8"'$  (1.727 m)   Wt 180 lb 3.2 oz (81.7 kg)   SpO2 95%   BMI 27.40 kg/m   BP/Weight 03/13/2017 03/13/2017 9/47/6546  Systolic BP - 503 546  Diastolic BP - 82 86  Wt. (Lbs) 179 180.2 174.4  BMI 27.22 27.4 26.52     Physical Exam  Cardiovascular: Normal rate, regular rhythm, normal heart sounds and intact distal pulses.   Pulmonary/Chest: Effort normal and breath sounds normal.  Abdominal: Soft. Bowel sounds are normal.  Musculoskeletal:       Lumbar back: She exhibits pain (with extension/ flexion) and spasm.  Positive straight leg test, left leg.  Skin: Skin is warm and dry.  Nursing note and vitals reviewed.   Assessment & Plan:   Problem List Items Addressed This Visit    None    Visit Diagnoses    Chronic bilateral low back pain with left-sided sciatica    -  Primary   Relevant Medications  methocarbamol (ROBAXIN) 500 MG tablet   traMADol (ULTRAM) 50 MG tablet   gabapentin (NEURONTIN) 300 MG capsule   ibuprofen (ADVIL,MOTRIN) 600 MG tablet   Other Relevant Orders   Ambulatory referral to Pain Clinic   Ambulatory referral to Orthopedics   MR Lumbar Spine Wo Contrast (Completed)   Adjustment disorder with mixed anxiety and depressed mood       History of asthma       Relevant Medications   albuterol (PROVENTIL HFA;VENTOLIN HFA) 108 (90 Base) MCG/ACT inhaler      Meds ordered this encounter  Medications  . albuterol (PROVENTIL HFA;VENTOLIN HFA) 108 (90 Base) MCG/ACT inhaler     Sig: Inhale 2 puffs into the lungs every 6 (six) hours as needed for wheezing or shortness of breath.    Dispense:  1 Inhaler    Refill:  2    Order Specific Question:   Supervising Provider    Answer:   Tresa Garter [id number]  . DISCONTD: gabapentin (NEURONTIN) 300 MG capsule    Sig: Take 1 capsule (300 mg total) by mouth 2 (two) times daily.    Dispense:  60 capsule    Refill:  0    Order Specific Question:   Supervising Provider    Answer:   Tresa Garter [id number]  . DISCONTD: ibuprofen (ADVIL,MOTRIN) 600 MG tablet    Sig: Take 1 tablet (600 mg total) by mouth every 6 (six) hours as needed (Take with food.).    Dispense:  40 tablet    Refill:  0    Order Specific Question:   Supervising Provider    Answer:   Tresa Garter [id number]  . methocarbamol (ROBAXIN) 500 MG tablet    Sig: Take 1 tablet (500 mg total) by mouth every 8 (eight) hours as needed for muscle spasms.    Dispense:  60 tablet    Refill:  0    Order Specific Question:   Supervising Provider    Answer:   Tresa Garter [id number]  . traMADol (ULTRAM) 50 MG tablet    Sig: Take 1 tablet (50 mg total) by mouth every 8 (eight) hours as needed for moderate pain or severe pain.    Dispense:  40 tablet    Refill:  0    Order Specific Question:   Supervising Provider    Answer:   Tresa Garter [id number]  . gabapentin (NEURONTIN) 300 MG capsule    Sig: Take 1 capsule (300 mg total) by mouth 2 (two) times daily.    Dispense:  60 capsule    Refill:  0    Order Specific Question:   Supervising Provider    Answer:   Tresa Garter [id number]  . ibuprofen (ADVIL,MOTRIN) 600 MG tablet    Sig: Take 1 tablet (600 mg total) by mouth every 6 (six) hours as needed (Take with food.).    Dispense:  40 tablet    Refill:  0    Order Specific Question:   Supervising Provider    Answer:   Tresa Garter [[id number]]     Alfonse Spruce FNP

## 2017-03-13 NOTE — Patient Instructions (Addendum)
Apply for orange card to complete referral process.  Back Pain, Adult Back pain is very common. The pain often gets better over time. The cause of back pain is usually not dangerous. Most people can learn to manage their back pain on their own. Follow these instructions at home: Watch your back pain for any changes. The following actions may help to lessen any pain you are feeling:  Stay active. Start with short walks on flat ground if you can. Try to walk farther each day.  Exercise regularly as told by your doctor. Exercise helps your back heal faster. It also helps avoid future injury by keeping your muscles strong and flexible.  Do not sit, drive, or stand in one place for more than 30 minutes.  Do not stay in bed. Resting more than 1-2 days can slow down your recovery.  Be careful when you bend or lift an object. Use good form when lifting:  Bend at your knees.  Keep the object close to your body.  Do not twist.  Sleep on a firm mattress. Lie on your side, and bend your knees. If you lie on your back, put a pillow under your knees.  Take medicines only as told by your doctor.  Put ice on the injured area.  Put ice in a plastic bag.  Place a towel between your skin and the bag.  Leave the ice on for 20 minutes, 2-3 times a day for the first 2-3 days. After that, you can switch between ice and heat packs.  Avoid feeling anxious or stressed. Find good ways to deal with stress, such as exercise.  Maintain a healthy weight. Extra weight puts stress on your back. Contact a doctor if:  You have pain that does not go away with rest or medicine.  You have worsening pain that goes down into your legs or buttocks.  You have pain that does not get better in one week.  You have pain at night.  You lose weight.  You have a fever or chills. Get help right away if:  You cannot control when you poop (bowel movement) or pee (urinate).  Your arms or legs feel weak.  Your  arms or legs lose feeling (numbness).  You feel sick to your stomach (nauseous) or throw up (vomit).  You have belly (abdominal) pain.  You feel like you may pass out (faint). This information is not intended to replace advice given to you by your health care provider. Make sure you discuss any questions you have with your health care provider. Document Released: 04/29/2008 Document Revised: 04/18/2016 Document Reviewed: 03/15/2014 Elsevier Interactive Patient Education  2017 [place].   Back Exercises If you have pain in your back, do these exercises 2-3 times each day or as told by your doctor. When the pain goes away, do the exercises once each day, but repeat the steps more times for each exercise (do more repetitions). If you do not have pain in your back, do these exercises once each day or as told by your doctor. Exercises Single Knee to Chest   Do these steps 3-5 times in a row for each leg: 1. Lie on your back on a firm bed or the floor with your legs stretched out. 2. Bring one knee to your chest. 3. Hold your knee to your chest by grabbing your knee or thigh. 4. Pull on your knee until you feel a gentle stretch in your lower back. 5. Keep doing the stretch  for 10-30 seconds. 6. Slowly let go of your leg and straighten it. Pelvic Tilt   Do these steps 5-10 times in a row: 1. Lie on your back on a firm bed or the floor with your legs stretched out. 2. Bend your knees so they point up to the ceiling. Your feet should be flat on the floor. 3. Tighten your lower belly (abdomen) muscles to press your lower back against the floor. This will make your tailbone point up to the ceiling instead of pointing down to your feet or the floor. 4. Stay in this position for 5-10 seconds while you gently tighten your muscles and breathe evenly. Cat-Cow   Do these steps until your lower back bends more easily: 1. Get on your hands and knees on a firm surface. Keep your hands under your  shoulders, and keep your knees under your hips. You may put padding under your knees. 2. Let your head hang down, and make your tailbone point down to the floor so your lower back is round like the back of a cat. 3. Stay in this position for 5 seconds. 4. Slowly lift your head and make your tailbone point up to the ceiling so your back hangs low (sags) like the back of a cow. 5. Stay in this position for 5 seconds. Press-Ups   Do these steps 5-10 times in a row: 1. Lie on your belly (face-down) on the floor. 2. Place your hands near your head, about shoulder-width apart. 3. While you keep your back relaxed and keep your hips on the floor, slowly straighten your arms to raise the top half of your body and lift your shoulders. Do not use your back muscles. To make yourself more comfortable, you may change where you place your hands. 4. Stay in this position for 5 seconds. 5. Slowly return to lying flat on the floor. Bridges   Do these steps 10 times in a row: 1. Lie on your back on a firm surface. 2. Bend your knees so they point up to the ceiling. Your feet should be flat on the floor. 3. Tighten your butt muscles and lift your butt off of the floor until your waist is almost as high as your knees. If you do not feel the muscles working in your butt and the back of your thighs, slide your feet 1-2 inches farther away from your butt. 4. Stay in this position for 3-5 seconds. 5. Slowly lower your butt to the floor, and let your butt muscles relax. If this exercise is too easy, try doing it with your arms crossed over your chest. Belly Crunches   Do these steps 5-10 times in a row: 1. Lie on your back on a firm bed or the floor with your legs stretched out. 2. Bend your knees so they point up to the ceiling. Your feet should be flat on the floor. 3. Cross your arms over your chest. 4. Tip your chin a little bit toward your chest but do not bend your neck. 5. Tighten your belly muscles and  slowly raise your chest just enough to lift your shoulder blades a tiny bit off of the floor. 6. Slowly lower your chest and your head to the floor. Back Lifts  Do these steps 5-10 times in a row: 1. Lie on your belly (face-down) with your arms at your sides, and rest your forehead on the floor. 2. Tighten the muscles in your legs and your butt. 3. Slowly lift  your chest off of the floor while you keep your hips on the floor. Keep the back of your head in line with the curve in your back. Look at the floor while you do this. 4. Stay in this position for 3-5 seconds. 5. Slowly lower your chest and your face to the floor. Contact a doctor if:  Your back pain gets a lot worse when you do an exercise.  Your back pain does not lessen 2 hours after you exercise. If you have any of these problems, stop doing the exercises. Do not do them again unless your doctor says it is okay. Get help right away if:  You have sudden, very bad back pain. If this happens, stop doing the exercises. Do not do them again unless your doctor says it is okay. This information is not intended to replace advice given to you by your health care provider. Make sure you discuss any questions you have with your health care provider. Document Released: 12/14/2010 Document Revised: 04/18/2016 Document Reviewed: 01/05/2015 Elsevier Interactive Patient Education  2017 [place].   Magnetic Resonance Imaging Magnetic resonance imaging (MRI) is a painless test that takes pictures of the inside of your body. This test uses a strong magnet. This test does not use X-rays or radiation. What happens before the procedure?  You will be asked to take off all metal. This includes:  Your watch, jewelry, and other metal items.  Some makeup may have very small bits of metal and may need to be taken off.  Braces and fillings normally are not a problem. What happens during the procedure?  You may be given earplugs or headphones to  listen to music. The machine can be noisy.  You might get a shot (injection) with a dye (contrast material) to help the MRI take better pictures.  MRI is done in a tunnel-shaped scanner. You will lie on a table that slides into the tunnel-shaped scanner. Once inside, you will still be able to talk to the person doing the test.  You will be asked to hold very still. You will be told when you can shift position. You may have to wait a few minutes to make sure the images are readable. What happens after the procedure?  You may go back to your normal activities right away.  If you got a shot of dye, it will pass naturally through your body within a day.  Your doctor will talk to you about the results. This information is not intended to replace advice given to you by your health care provider. Make sure you discuss any questions you have with your health care provider. Document Released: 12/14/2010 Document Revised: 04/18/2016 Document Reviewed: 01/06/2014 Elsevier Interactive Patient Education  2017 [employer].

## 2017-03-13 NOTE — Progress Notes (Signed)
Patient is here for left  lower back pain all the way to her left leg since November pain is always there only way that it ease, is when she lays down    Patient stated that it burns behind the knee & her leg feels tingling  Patient stated that she needs refill on her medication except her inhaler

## 2017-03-19 ENCOUNTER — Ambulatory Visit: Payer: Self-pay | Attending: Family Medicine

## 2017-03-21 ENCOUNTER — Ambulatory Visit (HOSPITAL_COMMUNITY)
Admission: RE | Admit: 2017-03-21 | Discharge: 2017-03-21 | Disposition: A | Discharge status: Home / Self Care | Payer: Self-pay | Source: Ambulatory Visit | Attending: Family Medicine | Admitting: Family Medicine

## 2017-03-21 DIAGNOSIS — M5442 Lumbago with sciatica, left side: Secondary | ICD-10-CM | POA: Insufficient documentation

## 2017-03-21 DIAGNOSIS — M48061 Spinal stenosis, lumbar region without neurogenic claudication: Secondary | ICD-10-CM | POA: Insufficient documentation

## 2017-03-21 DIAGNOSIS — G8929 Other chronic pain: Secondary | ICD-10-CM | POA: Insufficient documentation

## 2017-03-21 DIAGNOSIS — M5136 Other intervertebral disc degeneration, lumbar region: Secondary | ICD-10-CM | POA: Insufficient documentation

## 2017-03-21 DIAGNOSIS — M8938 Hypertrophy of bone, other site: Secondary | ICD-10-CM | POA: Insufficient documentation

## 2017-03-21 DIAGNOSIS — M5126 Other intervertebral disc displacement, lumbar region: Secondary | ICD-10-CM | POA: Insufficient documentation

## 2017-03-24 ENCOUNTER — Telehealth: Payer: Self-pay | Admitting: Family Medicine

## 2017-03-24 ENCOUNTER — Other Ambulatory Visit: Payer: Self-pay | Admitting: Family Medicine

## 2017-03-24 DIAGNOSIS — M5136 Other intervertebral disc degeneration, lumbar region: Secondary | ICD-10-CM

## 2017-03-24 DIAGNOSIS — M5126 Other intervertebral disc displacement, lumbar region: Secondary | ICD-10-CM

## 2017-03-24 NOTE — Telephone Encounter (Signed)
Pt calling to set appt due to excrutiating back pain, can't get into pain clinic until 04/25/17. Wants to know if she can have a med to help relieve her pain, Please f/u.

## 2017-03-24 NOTE — Telephone Encounter (Signed)
Good Afternoon  I sent an email to [name] to  follow up the Status of her Eligibility  Patient need to have CAFA first and after  I can refer her to [employer].

## 2017-03-24 NOTE — Telephone Encounter (Signed)
[name] answer me  She is currently pending documents. As soon as I have them she  ill let you know

## 2017-03-24 NOTE — Telephone Encounter (Signed)
Will place urgent referral for orthopedics and determine if appointment can be made sooner with pain management.  Too soon for patient to get refill on Tramadol, prescription was filled on 4/19 and she was given a 13 day supply. If she would like anti-inflammatory pain medication  for back pain that can be prescribed.

## 2017-03-25 ENCOUNTER — Other Ambulatory Visit: Payer: Self-pay | Admitting: Family Medicine

## 2017-03-25 DIAGNOSIS — M5136 Other intervertebral disc degeneration, lumbar region: Secondary | ICD-10-CM

## 2017-03-25 MED ORDER — DICLOFENAC SODIUM 75 MG PO TBEC: 75.0000 mg | DELAYED_RELEASE_TABLET | Freq: Two times a day (BID) | ORAL | 1 refills | Status: DC

## 2017-03-26 ENCOUNTER — Ambulatory Visit: Payer: Self-pay | Attending: Family Medicine | Admitting: Family Medicine

## 2017-03-26 VITALS — BP 138/97 | HR 146 | Temp 98.2°F | Resp 18 | Ht 68.0 in | Wt 171.0 lb

## 2017-03-26 DIAGNOSIS — Z79891 Long term (current) use of opiate analgesic: Secondary | ICD-10-CM | POA: Insufficient documentation

## 2017-03-26 DIAGNOSIS — M5136 Other intervertebral disc degeneration, lumbar region: Secondary | ICD-10-CM

## 2017-03-26 DIAGNOSIS — Z79899 Other long term (current) drug therapy: Secondary | ICD-10-CM | POA: Insufficient documentation

## 2017-03-26 DIAGNOSIS — Z791 Long term (current) use of non-steroidal anti-inflammatories (NSAID): Secondary | ICD-10-CM | POA: Insufficient documentation

## 2017-03-26 DIAGNOSIS — R2242 Localized swelling, mass and lump, left lower limb: Secondary | ICD-10-CM

## 2017-03-26 DIAGNOSIS — R202 Paresthesia of skin: Secondary | ICD-10-CM | POA: Insufficient documentation

## 2017-03-26 DIAGNOSIS — M5442 Lumbago with sciatica, left side: Secondary | ICD-10-CM

## 2017-03-26 DIAGNOSIS — M545 Low back pain: Secondary | ICD-10-CM | POA: Insufficient documentation

## 2017-03-26 DIAGNOSIS — G8929 Other chronic pain: Secondary | ICD-10-CM

## 2017-03-26 MED ORDER — TRAMADOL HCL 50 MG PO TABS: 50.0000 mg | ORAL_TABLET | Freq: Three times a day (TID) | ORAL | 0 refills | Status: DC | PRN

## 2017-03-26 MED ORDER — METHOCARBAMOL 500 MG PO TABS: 500.0000 mg | ORAL_TABLET | Freq: Three times a day (TID) | ORAL | 0 refills | Status: DC | PRN

## 2017-03-26 MED ORDER — DICLOFENAC SODIUM 75 MG PO TBEC: 75.0000 mg | DELAYED_RELEASE_TABLET | Freq: Two times a day (BID) | ORAL | 1 refills | Status: DC

## 2017-03-26 NOTE — Progress Notes (Signed)
Patient is here for f/up  Patient stated that it doesn't work anything that she been prescribe   Patient stated that she has some knots on her spine and buttocks

## 2017-03-26 NOTE — Patient Instructions (Addendum)
Back Pain, Adult  Back pain is very common. The pain often gets better over time. The cause of back pain is usually not dangerous. Most people can learn to manage their back pain on their own.  Follow these instructions at home:  Watch your back pain for any changes. The following actions may help to lessen any pain you are feeling:  · Stay active. Start with short walks on flat ground if you can. Try to walk farther each day.  · Exercise regularly as told by your doctor. Exercise helps your back heal faster. It also helps avoid future injury by keeping your muscles strong and flexible.  · Do not sit, drive, or stand in one place for more than 30 minutes.  · Do not stay in bed. Resting more than 1-2 days can slow down your recovery.  · Be careful when you bend or lift an object. Use good form when lifting:  ? Bend at your knees.  ? Keep the object close to your body.  ? Do not twist.  · Sleep on a firm mattress. Lie on your side, and bend your knees. If you lie on your back, put a pillow under your knees.  · Take medicines only as told by your doctor.  · Put ice on the injured area.  ? Put ice in a plastic bag.  ? Place a towel between your skin and the bag.  ? Leave the ice on for 20 minutes, 2-3 times a day for the first 2-3 days. After that, you can switch between ice and heat packs.  · Avoid feeling anxious or stressed. Find good ways to deal with stress, such as exercise.  · Maintain a healthy weight. Extra weight puts stress on your back.    Contact a doctor if:  · You have pain that does not go away with rest or medicine.  · You have worsening pain that goes down into your legs or buttocks.  · You have pain that does not get better in one week.  · You have pain at night.  · You lose weight.  · You have a fever or chills.  Get help right away if:  · You cannot control when you poop (bowel movement) or pee (urinate).  · Your arms or legs feel weak.  · Your arms or legs lose feeling (numbness).  · You feel sick  to your stomach (nauseous) or throw up (vomit).  · You have belly (abdominal) pain.  · You feel like you may pass out (faint).  This information is not intended to replace advice given to you by your health care provider. Make sure you discuss any questions you have with your health care provider.  Document Released: 04/29/2008 Document Revised: 04/18/2016 Document Reviewed: 03/15/2014  Elsevier Interactive Patient Education © 2017 Elsevier Inc[employer]

## 2017-03-26 NOTE — Progress Notes (Signed)
Subjective:  Patient ID: [name], female    DOB: 19-Nov-1971  Age: 46 y.o. MRN: [id number]  CC: Establish Care   HPI YARNELL KOZLOSKI presents for   Back Pain: Patient presents for presents evaluation of low back problems.  Symptoms have been present for several years and include paresthesias in left leg. Initial inciting event: none.  Associated symptoms include a mass present on left upper buttock for 2 months. Symptoms are not worsened by time of day. Alleviating factors identifiable by patient are medication narcotics, anti-spasmodic's, applying ice, and side sitting . Exacerbating factors identifiable by patient are sitting. Treatments so far initiated by patient: medication use Previous lower back problems: chronic. Previous workup: x-rays. Previous treatments: none. Denies any bowel or bladder incontinence.   Outpatient Medications Prior to Visit  Medication Sig Dispense Refill  . albuterol (PROVENTIL HFA;VENTOLIN HFA) 108 (90 Base) MCG/ACT inhaler Inhale 2 puffs into the lungs every 6 (six) hours as needed for wheezing or shortness of breath. 1 Inhaler 2  . diphenhydramine-acetaminophen (TYLENOL PM) 25-500 MG TABS tablet Take 2 tablets by mouth at bedtime as needed (pain).    Marland Kitchen gabapentin (NEURONTIN) 300 MG capsule Take 1 capsule (300 mg total) by mouth 2 (two) times daily. 60 capsule 0  . ibuprofen (ADVIL,MOTRIN) 600 MG tablet Take 1 tablet (600 mg total) by mouth every 6 (six) hours as needed (Take with food.). 40 tablet 0  . methocarbamol (ROBAXIN) 500 MG tablet Take 1 tablet (500 mg total) by mouth every 8 (eight) hours as needed for muscle spasms. 60 tablet 0  . traMADol (ULTRAM) 50 MG tablet Take 1 tablet (50 mg total) by mouth every 8 (eight) hours as needed for moderate pain or severe pain. 40 tablet 0  . HYDROcodone-acetaminophen (NORCO) 10-325 MG tablet Take 1 tablet by mouth every 8 (eight) hours as needed. 30 tablet 0  . lidocaine (LMX) 4 % cream Apply 1 application  topically 3 (three) times daily as needed. (Patient not taking: Reported on 03/13/2017) 133 g 1  . diclofenac (VOLTAREN) 75 MG EC tablet Take 1 tablet (75 mg total) by mouth 2 (two) times daily. 60 tablet 1   No facility-administered medications prior to visit.     ROS Review of Systems  Constitutional: Negative.   Respiratory: Negative.   Cardiovascular: Negative.   Gastrointestinal: Negative.   Musculoskeletal: Positive for back pain and myalgias.  Skin:       Lump   Neurological:       Numbness and tingling of left leg.    Objective:  BP (!) 138/97 (BP Location: Left Arm, Patient Position: Sitting, Cuff Size: Normal)   Pulse (!) 146   Temp 98.2 F (36.8 C) (Oral)   Resp 18   Ht '5\' 8"'$  (1.727 m)   Wt 171 lb (77.6 kg)   SpO2 93%   BMI 26.00 kg/m   BP/Weight 03/26/2017 03/13/2017 9/70/2637  Systolic BP 858 - 850  Diastolic BP 97 - 82  Wt. (Lbs) 171 179 180.2  BMI 26 27.22 27.4   Physical Exam  Constitutional: She is oriented to person, place, and time.  Cardiovascular: Normal rate, regular rhythm, normal heart sounds and intact distal pulses.   Pulmonary/Chest: Effort normal and breath sounds normal.  Abdominal: Soft. Bowel sounds are normal.  Musculoskeletal:       Lumbar back: She exhibits decreased range of motion, pain and spasm.  Neurological: She is alert and oriented to person, place, and time. She  has normal reflexes. No cranial nerve deficit.  Skin: Skin is warm and dry.     Psychiatric: Her mood appears anxious. She expresses no homicidal and no suicidal ideation. She expresses no suicidal plans and no homicidal plans.  Tearful .  Nursing note and vitals reviewed.   Assessment & Plan:   Problem List Items Addressed This Visit    None    Visit Diagnoses    Disc degeneration, lumbar    -  Primary   Relevant Medications   diclofenac (VOLTAREN) 75 MG EC tablet   traMADol (ULTRAM) 50 MG tablet   methocarbamol (ROBAXIN) 500 MG tablet   Chronic bilateral  low back pain with left-sided sciatica       Relevant Medications   diclofenac (VOLTAREN) 75 MG EC tablet   traMADol (ULTRAM) 50 MG tablet   methocarbamol (ROBAXIN) 500 MG tablet   Hip region mass, left       Relevant Orders   Ambulatory referral to General Surgery      Meds ordered this encounter  Medications  . diclofenac (VOLTAREN) 75 MG EC tablet    Sig: Take 1 tablet (75 mg total) by mouth 2 (two) times daily.    Dispense:  30 tablet    Refill:  1    Order Specific Question:   Supervising Provider    Answer:   Tresa Garter [id number]  . traMADol (ULTRAM) 50 MG tablet    Sig: Take 1 tablet (50 mg total) by mouth every 8 (eight) hours as needed for moderate pain or severe pain.    Dispense:  40 tablet    Refill:  0    Order Specific Question:   Supervising Provider    Answer:   Tresa Garter [id number]  . methocarbamol (ROBAXIN) 500 MG tablet    Sig: Take 1 tablet (500 mg total) by mouth every 8 (eight) hours as needed for muscle spasms.    Dispense:  30 tablet    Refill:  0    Order Specific Question:   Supervising Provider    Answer:   Tresa Garter [id number]    Follow-up: Return if symptoms worsen or fail to improve.   Alfonse Spruce FNP

## 2017-03-26 NOTE — Telephone Encounter (Signed)
-----   Message from Alfonse Spruce, [place] sent at 03/25/2017  4:59 PM EDT ----- Progressed disc degeneration.  Follow up with orthopedic referral.

## 2017-03-26 NOTE — Telephone Encounter (Signed)
CMA call regarding results   Patient Verify DOB   Patient was aware and understood  

## 2017-04-15 ENCOUNTER — Other Ambulatory Visit: Payer: Self-pay

## 2017-04-23 ENCOUNTER — Ambulatory Visit (INDEPENDENT_AMBULATORY_CARE_PROVIDER_SITE_OTHER): Payer: Self-pay | Admitting: Orthopedic Surgery

## 2017-04-24 ENCOUNTER — Encounter: Payer: Self-pay | Admitting: Surgery

## 2017-04-24 ENCOUNTER — Ambulatory Visit (INDEPENDENT_AMBULATORY_CARE_PROVIDER_SITE_OTHER): Payer: Self-pay | Admitting: Surgery

## 2017-04-24 VITALS — BP 104/84 | HR 128 | Temp 97.9°F | Ht 68.0 in | Wt 173.2 lb

## 2017-04-24 DIAGNOSIS — M545 Low back pain: Secondary | ICD-10-CM

## 2017-04-24 NOTE — Patient Instructions (Signed)
Please keep your appointment tomorrow at the [facility].  We will send the referral to An Orthopaedic Spine Surgeon and someone from their office will contact you to make an appointment. Please call our office if you have not  heard from someone within 7 days.

## 2017-04-24 NOTE — Progress Notes (Signed)
04/24/2017  Reason for Visit:  Low back pain  History of Present Illness: [name] is a 46 y.o. female who presents with a 2 month history of worsening low back/upper buttocks pain on the left side. Patient reports that 6 months ago, she lift up a heavy box and immediately started having low back pain. This has continued since then and now feels like she may have a lump or mass on the left upper buttocks/low back which is very tender to palpation. She feels that this gets worse during the day. She reports the pain is particularly aggravated when she is sitting as well as when walking. Thus she has been having to be lying down on her right side most of the time. She has tried eyes as well as heating pad with no real improvement. She also describes numbness and tingling this with shooting pain towards the left leg. Denies any issues on the right side. She did get an MRI of the lumbar spine in April which noted progression of her L4-L5 left foraminal disc herniation as well as new recess stenosis at the level of the descending L5 nerve roots. No masses noted. The patient was referred to orthopedic surgery but apparently they did not take her insurance and she was also referred to general surgery for this question of mass. Denies having any fevers, chills, chest pain, shortness of breath. Denies any drainage or induration of the skin.  Past Medical History: Past Medical History:  Diagnosis Date  . Asthma   . COPD (chronic obstructive pulmonary disease) ([place])   . Irregular heart rate   . Renal disorder    kidney infections     Past Surgical History: Past Surgical History:  Procedure Laterality Date  . APPENDECTOMY    . TUBAL LIGATION    . TUBAL LIGATION  1998    Home Medications: Prior to Admission medications   Medication Sig Start Date End Date Taking? Authorizing Provider  albuterol (PROVENTIL HFA;VENTOLIN HFA) 108 (90 Base) MCG/ACT inhaler Inhale 2 puffs into the lungs every 6 (six)  hours as needed for wheezing or shortness of breath. 03/13/17  Yes Hairston, Mandesia R, FNP  diphenhydramine-acetaminophen (TYLENOL PM) 25-500 MG TABS tablet Take 2 tablets by mouth at bedtime as needed (pain).   Yes [provider]  ibuprofen (ADVIL,MOTRIN) 600 MG tablet Take 1 tablet (600 mg total) by mouth every 6 (six) hours as needed (Take with food.). 03/13/17  Yes Hairston, Mandesia R, FNP  lidocaine (LMX) 4 % cream Apply 1 application topically 3 (three) times daily as needed. 11/06/16  Yes Pisciotta, Elmyra Ricks, PA-C  methocarbamol (ROBAXIN) 750 MG tablet Take 750 mg by mouth. 01/05/17  Yes [provider]  traMADol (ULTRAM) 50 MG tablet Take 1 tablet (50 mg total) by mouth every 8 (eight) hours as needed for moderate pain or severe pain. 03/26/17  Yes Alfonse Spruce, FNP    Allergies: Allergies  Allergen Reactions  . Bactrim [Sulfamethoxazole-Trimethoprim] Nausea Only    Social History:  reports that she has been smoking Cigarettes.  She has a 45.00 pack-year smoking history. She has never used smokeless tobacco. She reports that she drinks alcohol. She reports that she does not use drugs.   Family History: Family History  Problem Relation Age of Onset  . Hypertension Mother   . Hypertension Father   . Heart disease Father     Review of Systems: Review of Systems  Constitutional: Negative for chills and fever.  HENT: Negative  for hearing loss.   Eyes: Negative for blurred vision.  Respiratory: Negative for cough.   Cardiovascular: Negative for chest pain.  Gastrointestinal: Negative for abdominal pain, nausea and vomiting.  Genitourinary: Negative for dysuria.  Musculoskeletal: Positive for back pain and myalgias.  Skin: Negative for rash.  Neurological: Positive for tingling (down the left leg). Negative for dizziness.  Psychiatric/Behavioral: Negative for depression.    Physical Exam BP 104/84   Pulse (!) 128   Temp 97.9 F (36.6 C) (Oral)   Ht  5\' 8"  (1.727 m)   Wt 78.6 kg (173 lb 3.2 oz)   BMI 26.33 kg/m  CONSTITUTIONAL: No acute distress RESPIRATORY:  Lungs are clear, and breath sounds are equal bilaterally. Normal respiratory effort without pathologic use of accessory muscles. CARDIOVASCULAR: Heart is regular without murmurs, gallops, or rubs. GI: The abdomen is soft, nondistended, nontender to palpation.  MUSCULOSKELETAL:  There is tenderness to palpation over an area that is approximately 5 cm in size, located in the junction between low lumbar and upper sacral area, medially on the left side.  There is no radiating pain outside of this location.  There is no mass palpable or swelling of the skin, or induration of the skin.  The area is symmetric compared to the right side. PSYCH:  Alert and oriented to person, place and time. Affect is normal.  Laboratory Analysis: No results found for this or any previous visit (from the past 24 hour(s)).  Imaging: No results found.  Assessment and Plan: This is a 46 y.o. female who presents with low back / upper buttocks pain on the left side.  I have independently viewed the patient's MRI and discussed the findings with the patient.  Overall, there is worsening disc herniation issues, but no masses noted.  On exam, there is no palpable mass or area of swelling or induration over the left upper buttocks/lower back. Particularly the point where the patient describes the most tenderness, there is no changes noted. Both sides are symmetric without any evidence of bulging or masses. My concern is that this is mostly related to her herniated disc is causing more problems on her left side particularly with the description of the pain being shooting in nature as well as with tingliness and numbness towards the left lower extremity.  Discussed with the patient negative and there is no palpable masses or changes on that side, there is currently no acute need for general surgery intervention. However  given the findings on her MRI, she should be seen by orthopedic spine surgeon for further evaluation. She also has an appointment at the pain clinic tomorrow and have highly encouraged for her to keep that appointment as well. We will send a new referral to orthopedic surgery given that the last referral patient reports that they did not take her insurance. The patient understands this plan and all of her questions have been answered.  Face-to-face time spent with the patient and care providers was 45 minutes, with more than 50% of the time spent counseling, educating, and coordinating care of the patient.     Melvyn Neth, [place]

## 2017-04-25 ENCOUNTER — Encounter: Payer: Self-pay | Admitting: Physical Medicine & Rehabilitation

## 2017-04-25 ENCOUNTER — Ambulatory Visit (HOSPITAL_BASED_OUTPATIENT_CLINIC_OR_DEPARTMENT_OTHER): Payer: Self-pay | Admitting: Physical Medicine & Rehabilitation

## 2017-04-25 ENCOUNTER — Encounter: Payer: Self-pay | Attending: Physical Medicine & Rehabilitation

## 2017-04-25 ENCOUNTER — Telehealth: Payer: Self-pay

## 2017-04-25 VITALS — BP 103/68 | HR 100 | Resp 14

## 2017-04-25 DIAGNOSIS — M5416 Radiculopathy, lumbar region: Secondary | ICD-10-CM | POA: Insufficient documentation

## 2017-04-25 DIAGNOSIS — M5136 Other intervertebral disc degeneration, lumbar region: Secondary | ICD-10-CM

## 2017-04-25 MED ORDER — KETOROLAC TROMETHAMINE 60 MG/2ML IM SOLN
60.0000 mg | Freq: Once | INTRAMUSCULAR | Status: AC
  Administered 2017-04-25: 60 mg via INTRAMUSCULAR

## 2017-04-25 MED ORDER — LIDOCAINE 4 % EX CREA: 1.0000 "application " | TOPICAL_CREAM | Freq: Three times a day (TID) | CUTANEOUS | 1 refills | Status: DC | PRN

## 2017-04-25 MED ORDER — GABAPENTIN 300 MG PO CAPS
300.0000 mg | ORAL_CAPSULE | Freq: Three times a day (TID) | ORAL | 0 refills | Status: DC
Start: 2017-04-25 — End: 2018-05-01

## 2017-04-25 MED ORDER — GABAPENTIN 300 MG PO CAPS: 300.0000 mg | ORAL_CAPSULE | Freq: Three times a day (TID) | ORAL | 0 refills | Status: DC

## 2017-04-25 NOTE — Telephone Encounter (Signed)
Patient called requesting we move her prescriptions from [facility] to [place] [facility] due to affordability, medication reordered to [facility].

## 2017-04-25 NOTE — Patient Instructions (Signed)
Toradol injection today  Epidural injection may need up to 3

## 2017-04-25 NOTE — Progress Notes (Signed)
Subjective:    Patient ID: [name], female    DOB: [birth year], 46 y.o.   MRN: [id number]  HPI 46 year old female with primary complaint of back pain and left lower extremity pain. Her pain is exacerbated by standing as well as walking. She states that she's been mainly staying in bed.  Patient has had a prior incident approximate 3 years ago with right lower extremity pain associated with back pain. At that time she underwent evaluation with lumbar spine x-ray, which was negative, multiple ED visits. Her symptoms improved over the course of 6 months.  She had a previous flareup in 2014 and underwent some type of lumbar injection with Dr. Nelva Bush, which she states was not very helpful.  Patient states that she has some numbness in the left leg. None in the foot. None in the thigh. No perianal numbness or tingling. No bowel or bladder dysfunction, no recent infections or fevers. No recent weight loss  Has tried nonsteroidal anti-inflammatories, muscle relaxers, has tried tramadol and hydrocodone.  Patient had a prednisone burst and taper, which is not helpful. However, the corticosteroid injection was helpful on a temporary basis.  There was an ED visit on 01/12/2017, which included Toradol injection, which was reported as helpful.  Patient not sure exactly what was in the injection that she stated was helpful.  Patient indicates that she has a referral for spine surgeon evaluation. She would like to avoid surgery if possible.  Pain Inventory Average Pain 10 Pain Right Now 9 My pain is sharp, burning, stabbing and tingling  In the last 24 hours, has pain interfered with the following? General activity 10 Relation with others 10 Enjoyment of life 6 What TIME of day is your pain at its worst? all Sleep (in general) Poor  Pain is worse with: walking, sitting and standing Pain improves with: rest and medication Relief from Meds: 5  Mobility walk without assistance how many  minutes can you walk? 5 ability to climb steps?  yes do you drive?  yes needs help with transfers  Function employed # of hrs/week unable to work  Neuro/Psych numbness tingling trouble walking spasms  Prior Studies CT/MRI new visit EXAM: MRI LUMBAR SPINE WITHOUT CONTRAST  TECHNIQUE: Multiplanar, multisequence MR imaging of the lumbar spine was performed. No intravenous contrast was administered.  COMPARISON:  Lumbar radiographs 01/12/2017. [facility] lumbar MRI 06/17/2014  FINDINGS: Segmentation: Normal as seen on the recent radiographs, and this is the same numbering system used on the 2015 MRI.  Alignment:  Stable, with straightening of lumbar lordosis.  Vertebrae: No marrow edema or evidence of acute osseous abnormality. Visualized bone marrow signal is within normal limits. Negative visible sacrum and SI joints.  Conus medullaris: Extends to the T12-L1 level and appears normal.  Paraspinal and other soft tissues: Negative.  Disc levels:  T11-T12:  Negative.  T12-L1:  Negative.  L1-L2:  Negative.  L2-L3: Negative except for chronic borderline to mild facet hypertrophy.  L3-L4:  Negative disc.  Stable mild facet hypertrophy.  No stenosis.  L4-L5: Progressed mild disc desiccation. Mild circumferential disc bulge. Chronic left foraminal but new left lateral recess small to moderate disc protrusion (series 7, images 25 and 26). Mild facet and ligament flavum hypertrophy. New mild to moderate left lateral recess stenosis at the level of the descending left L5 nerve roots. Mild to moderate left L4 neural foraminal stenosis is stable to mildly increased. No spinal or right foraminal stenosis.  L5-S1:  Negative.  IMPRESSION: 1. Progressed disc degeneration at L4-L5 since 2015. Persistent broad-based left foraminal disc herniation, but with a new left lateral recess component. New mild to moderate lateral  recess stenosis at the level of the descending L5 nerve roots. Mild to moderate left L4 foraminal stenosis is stable to mildly increased. 2. Stable and negative lumbar spine elsewhere except for mild facet hypertrophy.   Electronically Signed   By: Genevie Ann M.D.   On: 03/21/2017 15:16 Physicians involved in your care new visit   Family History  Problem Relation Age of Onset  . Hypertension Mother   . Hypertension Father   . Heart disease Father    Social History   Social History  . Marital status: Legally Separated    Spouse name: N/A  . Number of children: N/A  . Years of education: N/A   Social History Main Topics  . Smoking status: Current Every Day Smoker    Packs/day: 1.50    Years: 30.00    Types: Cigarettes  . Smokeless tobacco: Never Used  . Alcohol use Yes     Comment: rare  . Drug use: No  . Sexual activity: Not Asked   Other Topics Concern  . None   Social History Narrative  . None   Past Surgical History:  Procedure Laterality Date  . APPENDECTOMY    . TUBAL LIGATION    . TUBAL LIGATION  1998   Past Medical History:  Diagnosis Date  . Asthma   . COPD (chronic obstructive pulmonary disease) ([place])   . Irregular heart rate   . Renal disorder    kidney infections   BP 103/68 (BP Location: Left Arm, Patient Position: Sitting, Cuff Size: Normal)   Pulse 100   Resp 14   SpO2 92%   Opioid Risk Score:   Fall Risk Score:  `1  Depression screen PHQ 2/9  Depression screen [facility] 2/9 04/25/2017 03/26/2017 03/13/2017 02/18/2017  Decreased Interest 1 3 3 3   Down, Depressed, Hopeless 1 1 3 3   PHQ - 2 Score 2 4 6 6   Altered sleeping 3 2 2 2   Tired, decreased energy 2 3 3 3   Change in appetite 1 3 3 3   Feeling bad or failure about yourself  1 3 3 2   Trouble concentrating 1 3 3 2   Moving slowly or fidgety/restless 0 2 0 0  Suicidal thoughts 0 1 0 0  PHQ-9 Score 10 21 20 18   Difficult doing work/chores Somewhat difficult - - -    Review of Systems   Constitutional: Negative.   HENT: Negative.   Eyes: Negative.   Respiratory: Negative.   Cardiovascular: Negative.   Gastrointestinal: Negative.   Endocrine: Negative.   Genitourinary: Negative.   Musculoskeletal: Positive for back pain and gait problem.       Spasms   Skin: Negative.   Allergic/Immunologic: Negative.   Neurological: Positive for numbness.       Tingling  Hematological: Negative.   Psychiatric/Behavioral: Negative.   All other systems reviewed and are negative.      Objective:   Physical Exam  Constitutional: She is oriented to person, place, and time. She appears well-developed and well-nourished.  HENT:  Head: Normocephalic and atraumatic.  Eyes: Conjunctivae and EOM are normal. Pupils are equal, round, and reactive to light.  Neck: Normal range of motion.  Cardiovascular: Normal rate, regular rhythm and normal heart sounds.   No murmur heard. Pulmonary/Chest: Effort normal and breath sounds normal. No respiratory distress.  Abdominal: Soft. Bowel sounds are normal. She exhibits no distension. There is no tenderness.  Neurological: She is alert and oriented to person, place, and time. Gait abnormal.  Negative straight leg raising  Antalgic gait. Tends to lean forward.  Motor strength is 5/5 bilateral deltoid by stress of grip, hip flexion, knee extensor, ankle dorsiflexor. Sensation intact to pinprick, bilateral L2, L3, L5-S1 distribution, reduced left L4 compared to right L4    Psychiatric: She has a normal mood and affect.  Nursing note and vitals reviewed.         Assessment & Plan:  1. Lumbar radiculitis, while the MRI demonstrates potential left L5 impingement, clinically, symptoms most consistent with L4. She does have some findings at L4 and L5, we'll make referral to physical therapy. Trial of gabapentin 300 mg 3 times a day  Over the last several years. She has intermittent exacerbations of her back pain. Her desire is to avoid  surgery. No signs of any red flags requiring urgent surgical referral  Repeat Toradol injection today. 60MG  IM  Set up for left transforaminal L4 and L5 epidural injections

## 2017-04-30 ENCOUNTER — Telehealth: Payer: Self-pay | Admitting: Surgery

## 2017-04-30 NOTE — Telephone Encounter (Signed)
Appointment has been made per referral to [facility] in [place] to see a Licensed conveyancer for a herniated disk.   This office does take the [place] assistance.   Appointment:  06/10/17 (Tuesday) @10 :30am--Dr Lorin Mercy.  [address] [place] [address] [phone]  Patient has been advised.

## 2017-05-08 ENCOUNTER — Ambulatory Visit: Payer: Medicaid Other | Attending: Physical Medicine & Rehabilitation | Admitting: Physical Therapy

## 2017-05-09 ENCOUNTER — Telehealth: Payer: Self-pay

## 2017-05-09 NOTE — Telephone Encounter (Signed)
Patient called, stating Is having large bouts of pain and is asking if there is anything we can do for her, she is scheduled for back injections in beginning of July.

## 2017-05-13 ENCOUNTER — Telehealth: Payer: Self-pay | Admitting: *Deleted

## 2017-05-13 MED ORDER — METHYLPREDNISOLONE 4 MG PO TBPK: ORAL_TABLET | ORAL | 0 refills | Status: DC

## 2017-05-13 NOTE — Telephone Encounter (Signed)
Patient called back and left a message that she had called on Friday with the expectation that someone was going to call her back.  She says she is in excruciating pain. She is very disappointed that she could not see her daughters graduation.  She states she was stuck in bed.  She states emphatically that she does not want to be one of those people (implying drug seeker), however she is in incredible pain.  She states she is miserable and has been dealing with this since November 2017. Her injection is not scheduled until July 10th, 2018.  She is asking for some form of relief until then.  I did call the patient.  I offered apologies for [place] not being in the place to help her immediately on Friday (no providers present on Friday, skeleton crew). Please advise on how we may be able to help patient.

## 2017-05-13 NOTE — Telephone Encounter (Signed)
error 

## 2017-05-19 ENCOUNTER — Ambulatory Visit (HOSPITAL_BASED_OUTPATIENT_CLINIC_OR_DEPARTMENT_OTHER): Payer: No Typology Code available for payment source | Admitting: Physical Medicine & Rehabilitation

## 2017-05-19 ENCOUNTER — Encounter: Payer: Self-pay | Admitting: Physical Medicine & Rehabilitation

## 2017-05-19 VITALS — BP 113/74 | HR 123 | Resp 14

## 2017-05-19 DIAGNOSIS — Z79899 Other long term (current) drug therapy: Secondary | ICD-10-CM

## 2017-05-19 DIAGNOSIS — Z5181 Encounter for therapeutic drug level monitoring: Secondary | ICD-10-CM

## 2017-05-19 DIAGNOSIS — M5416 Radiculopathy, lumbar region: Secondary | ICD-10-CM

## 2017-05-19 NOTE — Progress Notes (Signed)
  PROCEDURE RECORD [facility]   Name: ISSA LUSTER DOB:1971-11-24 MRN: [id number]  Date:05/19/2017  Physician: Alysia Penna, MD    Nurse/CMA: Jaeven Wanzer, CMA  Allergies:  Allergies  Allergen Reactions  . Bactrim [Sulfamethoxazole-Trimethoprim] Nausea Only    Consent Signed: Yes.    Is patient diabetic? No.  CBG today?   Pregnant: No. LMP: No LMP recorded. Patient is not currently having periods (Reason: Other). (age 46-55)  Anticoagulants: no Anti-inflammatory: no Antibiotics: no  Procedure: transforaminal epidural steroid injection  Position: Prone Start Time: 10:45am     End Time: 10:50am   Fluoro Time:   RN/CMA Tennessee Perra, CMA Ginette Bradway, CMA    Time 10:25am 10:55am    BP 113/74 112/615/10    Pulse 123 111    Respirations 14 14    O2 Sat 94 95    S/S 6 6    Pain Level 10/10 5/10     D/C home with Lidia Collum, patient A & O X 3, D/C instructions reviewed, and sits independently.

## 2017-05-19 NOTE — Patient Instructions (Signed)

## 2017-05-19 NOTE — Progress Notes (Signed)
LEFT L5-S1Lumbar transforaminal epidural steroid injection under fluoroscopic guidance  Indication: Lumbosacral radiculitis is not relieved by medication management or other conservative care and interfering with self-care and mobility.   Informed consent was obtained after describing risk and benefits of the procedure with the patient, this includes bleeding, bruising, infection, paralysis and medication side effects.  The patient wishes to proceed and has given written consent.  Patient was placed in prone position.  The lumbar area was marked and prepped with Betadine.  It was entered with a 25-gauge 1-1/2 inch needle and one mL of 1% lidocaine was injected into the skin and subcutaneous tissue.  Then a 22-gauge 3.5 inch spinal needle was inserted into the Left L5-S1 intervertebral foramen under AP, lateral, and oblique view.  Then a solution containing one mL of 10 mg per mL dexamethasone and 2 mL of 1% lidocaine was injected.  The patient tolerated procedure well.  Post procedure instructions were given.  Please see post procedure form.

## 2017-05-20 ENCOUNTER — Telehealth: Payer: Self-pay | Admitting: *Deleted

## 2017-05-20 MED ORDER — ACETAMINOPHEN-CODEINE #3 300-30 MG PO TABS: 1.0000 | ORAL_TABLET | Freq: Three times a day (TID) | ORAL | 0 refills | Status: DC | PRN

## 2017-05-20 NOTE — Telephone Encounter (Signed)
Oral swab drug screen collected and Tylenol # 3 called to [facility]. CSA signed.

## 2017-05-20 NOTE — Addendum Note (Signed)
Addended by: Caro Hight on: 05/20/2017 09:25 AM   Modules accepted: Orders

## 2017-05-20 NOTE — Telephone Encounter (Addendum)
[name] called this morning because she said since her injection she has been in excruciating pain and can not get out of bed.    Per Dr Letta Pate she can come in for an oral swab and he could prescribe something for her but we must collect the uds or oral swab first. I spoke with her and the pain started back about 1:30 in the morning and hasnt let up even with ice and heat.  She will come by the office today.  Per [place] [facility] her last rx was for tramadol 50 mg #40 fro [facility] NP. Nothing above that since 02/18/17 which was hydrocodone 10/325 #30 . Per Dr  Letta Pate we may call in Tylenol #3  1 q 8 hours #90 after specimen collected. Order was attached to appt yesterday 05/19/17 and will be collected today. CSA will be signed when she comes in.

## 2017-05-27 LAB — DRUG TOX MONITOR 1 W/CONF, ORAL FLD
AMPHETAMINES: NEGATIVE ng/mL (ref <10)
BARBITURATES: NEGATIVE ng/mL (ref <10)
Benzodiazepines: NEGATIVE ng/mL (ref <0.50)
Benzoylecgonine: 193.7 ng/mL — ABNORMAL HIGH (ref <2.5)
Buprenorphine: NEGATIVE ng/mL (ref <0.025)
COCAINE: 64.6 ng/mL — AB (ref <2.5)
Cocaine: POSITIVE ng/mL — AB (ref <2.5)
Cotinine: 52.4 ng/mL — ABNORMAL HIGH (ref <5.0)
Fentanyl: NEGATIVE ng/mL (ref <0.10)
HEROIN METABOLITE: NEGATIVE ng/mL (ref <1.0)
MDMA: NEGATIVE ng/mL (ref <10)
Marijuana: NEGATIVE ng/mL (ref <2.5)
Meperidine: NEGATIVE ng/mL (ref <5.0)
Meprobamate: NEGATIVE ng/mL (ref <2.5)
Methadone: NEGATIVE ng/mL (ref <5.0)
NICOTINE METABOLITE: POSITIVE ng/mL — AB (ref <5.0)
Opiates: NEGATIVE ng/mL (ref <2.5)
PHENCYCLIDINE: NEGATIVE ng/mL (ref <10)
Propoxyphene: NEGATIVE ng/mL (ref <5.0)
TAPENTADOL: NEGATIVE ng/mL (ref <5.0)
Tramadol: NEGATIVE ng/mL (ref <5.0)
ZOLPIDEM: NEGATIVE ng/mL (ref <5.0)

## 2017-05-27 LAB — DRUG TOX ALC METAB W/CON, ORAL FLD: Alcohol Metabolite: NEGATIVE ng/mL (ref <25)

## 2017-06-03 ENCOUNTER — Telehealth: Payer: Self-pay | Admitting: *Deleted

## 2017-06-03 ENCOUNTER — Ambulatory Visit: Payer: Self-pay | Admitting: Physical Medicine & Rehabilitation

## 2017-06-03 NOTE — Telephone Encounter (Signed)
Oral swab drug screen for this encounter positive for cocaine and its metabolite benzoylecgonine.

## 2017-06-04 NOTE — Telephone Encounter (Signed)
Inform pt of results, offer referral to [facility] and tell her we can only prescribe tramadol 50mg  BID for pain

## 2017-06-04 NOTE — Telephone Encounter (Signed)
I spoke with [name]. I offered her the information on [facility] but she declined.  ([place], [place], [place] [address]    Phone: [phone])  She is to see the surgeon next week.

## 2017-06-10 ENCOUNTER — Encounter (INDEPENDENT_AMBULATORY_CARE_PROVIDER_SITE_OTHER): Payer: Self-pay | Admitting: Orthopaedic Surgery

## 2017-06-10 ENCOUNTER — Ambulatory Visit (INDEPENDENT_AMBULATORY_CARE_PROVIDER_SITE_OTHER): Payer: Self-pay | Admitting: Orthopaedic Surgery

## 2017-06-10 VITALS — BP 129/87 | HR 92 | Ht 68.0 in | Wt 170.0 lb

## 2017-06-10 DIAGNOSIS — M545 Low back pain: Secondary | ICD-10-CM

## 2017-06-10 NOTE — Progress Notes (Signed)
Office Visit Note   Patient: [name]           Date of Birth: [birth year]           MRN: [id number] Visit Date: 06/10/2017              Requested by: Alfonse Spruce, [place], [place] [address] PCP: Alfonse Spruce, FNP   Assessment & Plan: Visit Diagnoses: L 4-5 disc protrusion. Lateral recess stenosis left L4-5. We'll set up for an epidural with Dr. Ernestina Patches in her request. She's had previous epidurals elsewhere but asked to have her injection done with a different provider. We'll set this up injection up with Dr. Ernestina Patches and she can follow-up with me after the injection.  Plan: We will set her up for an epidural injection on the left at L4-5 for the disc protrusion. She had previous bad experience with epidural states the next day she was doing worse. She's had an MRI in 2015 which shows some disc protrusion L4-5 on the left similar to the findings that she has. She was doing well for a period of time until November when her pain significantly increased.  Follow-Up Instructions: No Follow-up on file.   Orders:  No orders of the defined types were placed in this encounter.  No orders of the defined types were placed in this encounter.     Procedures: No procedures performed   Clinical Data: No additional findings.   Subjective: Chief Complaint  Patient presents with  . Lower Back - Pain    HPI 46 year old female with back and left leg pain which has been going on since September. She been seen in pain clinic treated by Dr. Elnita Maxwell had an epidural which she 6 states made it worse. She is picking up a box in November she previously worked in [employer]. She last worked about 8 months ago. She's taken tramadol ibuprofen. She said Tylenol 3 has had hydrocodone. States she missed her daughter's graduation driving makes the pain worse.  Review of Systems positive history of low back pain. She has 3 daughters had vaginal deliveries and  1 she had an epidural. She had lumbar epidural done in June which gave her increased pain. 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 She's been on Neurontin 300 mg, Robaxin, prednisone pack. She uses inhalers for asthma.   Objective: Vital Signs: BP 129/87   Pulse 92   Ht 5\' 8"  (1.727 m)   Wt 170 lb (77.1 kg)   BMI 25.85 kg/m   Physical Exam HEENT PERRLA EOMI TMI pharynx clear. Good cervical range of motion.   Lungs no audible wheezing.  Heart regular rate and rhythm.  Abdomen normal bowel sounds.  Distal pulses are palpable.  Neurologic patients oriented 3.  Ortho Exam patient has some pain with palpation of the sciatic notch both right and left. Reflexes are 2+ and symmetrical. She is more tender on the left than right sciatic notch. Trochanteric bursa surgeon normal. No pain with hip range of motion.  Specialty Comments:  No specialty comments available.  Imaging: MRI 03/21/2017 show progressive disc degeneration L4-5 since 2015. Persistent left broad-based left foraminal disc herniation with new left lateral recess component. Moderate lateral recess stenosis. Other levels show mild facet changes without compression. L5-S1 is normal.   PMFS History: Patient Active Problem List   Diagnosis Date Noted  . Left lumbar radiculitis 04/25/2017  . Lumbar degenerative disc disease 04/25/2017  Past Medical History:  Diagnosis Date  . Asthma   . COPD (chronic obstructive pulmonary disease) ([place])   . Irregular heart rate   . Renal disorder    kidney infections    Family History  Problem Relation Age of Onset  . Hypertension Mother   . Hypertension Father   . Heart disease Father     Past Surgical History:  Procedure Laterality Date  . APPENDECTOMY    . TUBAL LIGATION    . TUBAL LIGATION  1998   Social History   Occupational History  . Not on file.   Social History Main Topics  . Smoking status: Current Every Day Smoker    Packs/day: 1.50     Years: 30.00    Types: Cigarettes  . Smokeless tobacco: Never Used  . Alcohol use Yes     Comment: rare  . Drug use: No  . Sexual activity: Not on file

## 2017-06-19 ENCOUNTER — Ambulatory Visit: Payer: Self-pay | Attending: Family Medicine | Admitting: Family Medicine

## 2017-06-19 ENCOUNTER — Encounter: Payer: Self-pay | Admitting: Family Medicine

## 2017-06-19 VITALS — BP 106/73 | HR 121 | Temp 97.9°F | Resp 18 | Ht 68.0 in | Wt 179.6 lb

## 2017-06-19 DIAGNOSIS — M5442 Lumbago with sciatica, left side: Secondary | ICD-10-CM

## 2017-06-19 DIAGNOSIS — G8929 Other chronic pain: Secondary | ICD-10-CM

## 2017-06-19 DIAGNOSIS — Z79899 Other long term (current) drug therapy: Secondary | ICD-10-CM | POA: Insufficient documentation

## 2017-06-19 MED ORDER — NAPROXEN 500 MG PO TABS: 500.0000 mg | ORAL_TABLET | Freq: Two times a day (BID) | ORAL | 0 refills | Status: DC

## 2017-06-19 NOTE — Progress Notes (Signed)
Subjective:  Patient ID: [name], female    DOB: February 25, 1971  Age: 46 y.o. MRN: [id number]  CC: Back Pain   HPI [name] presents for chronic lower back pain with sciatica. Pain 6/10. Symptoms have been present for several years and include  Initial inciting event: none.  Associated symptoms include paresthesias in left leg. Symptoms are not worsened by time of day. Alleviating factors identifiable by patient are medication narcotics, anti-spasmodic's, applying ice, and side sitting.  Exacerbating factors identifiable by patient are sitting.Denies any bowel or bladder incontinence. Treatments so far initiated by patient: medication use  Previous lower back problems: chronic. Previous workup: X-rays, MRI, orthopedic and pain management referrals. History of referrals to orthopedics and pain management. She reports epidural injections were recommended by orthopedics for symptoms however she declined. She is requesting second opinion.    Outpatient Medications Prior to Visit  Medication Sig Dispense Refill  . acetaminophen-codeine (TYLENOL #3) 300-30 MG tablet take 1 tablet by mouth every 8 hours if needed for MODERATE PAIN  0  . albuterol (PROVENTIL HFA;VENTOLIN HFA) 108 (90 Base) MCG/ACT inhaler Inhale 2 puffs into the lungs every 6 (six) hours as needed for wheezing or shortness of breath. 1 Inhaler 2  . diphenhydramine-acetaminophen (TYLENOL PM) 25-500 MG TABS tablet Take 2 tablets by mouth at bedtime as needed (pain).    Marland Kitchen gabapentin (NEURONTIN) 300 MG capsule Take 1 capsule (300 mg total) by mouth 3 (three) times daily. (Patient not taking: Reported on 06/10/2017) 90 capsule 0  . ibuprofen (ADVIL,MOTRIN) 600 MG tablet Take 1 tablet (600 mg total) by mouth every 6 (six) hours as needed (Take with food.). (Patient not taking: Reported on 06/10/2017) 40 tablet 0  . lidocaine (LMX) 4 % cream Apply 1 application topically 3 (three) times daily as needed. (Patient not taking: Reported on  06/10/2017) 133 g 1  . methocarbamol (ROBAXIN) 750 MG tablet Take 750 mg by mouth.    . methylPREDNISolone (MEDROL DOSEPAK) 4 MG TBPK tablet follow package directions (Patient not taking: Reported on 06/10/2017) 21 tablet 0  . traMADol (ULTRAM) 50 MG tablet Take 1 tablet (50 mg total) by mouth every 8 (eight) hours as needed for moderate pain or severe pain. (Patient not taking: Reported on 06/10/2017) 40 tablet 0   No facility-administered medications prior to visit.     ROS Review of Systems  Constitutional: Negative.   Eyes: Negative.   Respiratory: Negative.   Cardiovascular: Negative.   Gastrointestinal: Negative.   Skin: Negative.   Psychiatric/Behavioral: Negative for suicidal ideas.     Objective:  BP 106/73 (BP Location: Left Arm, Patient Position: Sitting, Cuff Size: Normal)   Pulse (!) 121   Temp 97.9 F (36.6 C) (Oral)   Resp 18   Ht 5\' 8"  (1.727 m)   Wt 179 lb 9.6 oz (81.5 kg)   SpO2 95%   BMI 27.31 kg/m   BP/Weight 06/19/2017 06/10/2017 5/62/1308  Systolic BP 657 846 962  Diastolic BP 73 87 74  Wt. (Lbs) 179.6 170 -  BMI 27.31 25.85 -     Physical Exam  Constitutional: She appears well-developed and well-nourished.  Cardiovascular: Normal rate, regular rhythm, normal heart sounds and intact distal pulses.   Pulmonary/Chest: Effort normal and breath sounds normal.  Abdominal: Soft. Bowel sounds are normal.  Musculoskeletal: Normal range of motion.       Lumbar back: She exhibits pain (chronic lumbar pain, positive straight leg raise test).  Skin: Skin is  warm and dry.  Psychiatric: Her mood appears anxious. She expresses no homicidal and no suicidal ideation. She expresses no suicidal plans and no homicidal plans.  Nursing note and vitals reviewed.   Assessment & Plan:   Problem List Items Addressed This Visit    None    Visit Diagnoses    Chronic left-sided low back pain with left-sided sciatica    -  Primary   Relevant Medications   naproxen  (NAPROSYN) 500 MG tablet   Other Relevant Orders   Ambulatory referral to Orthopedics      Meds ordered this encounter  Medications  . naproxen (NAPROSYN) 500 MG tablet    Sig: Take 1 tablet (500 mg total) by mouth 2 (two) times daily with a meal. For 10 days. Then take one tablet mouth daily as needed.    Dispense:  60 tablet    Refill:  0    Order Specific Question:   Supervising Provider    Answer:   Tresa Garter [id number]    Follow-up: Return As needed.   Alfonse Spruce FNP

## 2017-06-19 NOTE — Patient Instructions (Addendum)
Follow up with orthopedic referral .   Back Pain, Adult Back pain is very common in adults.The cause of back pain is rarely dangerous and the pain often gets better over time.The cause of your back pain may not be known. Some common causes of back pain include:  Strain of the muscles or ligaments supporting the spine.  Wear and tear (degeneration) of the spinal disks.  Arthritis.  Direct injury to the back.  For many people, back pain may return. Since back pain is rarely dangerous, most people can learn to manage this condition on their own. Follow these instructions at home: Watch your back pain for any changes. The following actions may help to lessen any discomfort you are feeling:  Remain active. It is stressful on your back to sit or stand in one place for long periods of time. Do not sit, drive, or stand in one place for more than 30 minutes at a time. Take short walks on even surfaces as soon as you are able.Try to increase the length of time you walk each day.  Exercise regularly as directed by your health care provider. Exercise helps your back heal faster. It also helps avoid future injury by keeping your muscles strong and flexible.  Do not stay in bed.Resting more than 1-2 days can delay your recovery.  Pay attention to your body when you bend and lift. The most comfortable positions are those that put less stress on your recovering back. Always use proper lifting techniques, including: ? Bending your knees. ? Keeping the load close to your body. ? Avoiding twisting.  Find a comfortable position to sleep. Use a firm mattress and lie on your side with your knees slightly bent. If you lie on your back, put a pillow under your knees.  Avoid feeling anxious or stressed.Stress increases muscle tension and can worsen back pain.It is important to recognize when you are anxious or stressed and learn ways to manage it, such as with exercise.  Take medicines only as directed  by your health care provider. Over-the-counter medicines to reduce pain and inflammation are often the most helpful.Your health care provider may prescribe muscle relaxant drugs.These medicines help dull your pain so you can more quickly return to your normal activities and healthy exercise.  Apply ice to the injured area: ? Put ice in a plastic bag. ? Place a towel between your skin and the bag. ? Leave the ice on for 20 minutes, 2-3 times a day for the first 2-3 days. After that, ice and heat may be alternated to reduce pain and spasms.  Maintain a healthy weight. Excess weight puts extra stress on your back and makes it difficult to maintain good posture.  Contact a health care provider if:  You have pain that is not relieved with rest or medicine.  You have increasing pain going down into the legs or buttocks.  You have pain that does not improve in one week.  You have night pain.  You lose weight.  You have a fever or chills. Get help right away if:  You develop new bowel or bladder control problems.  You have unusual weakness or numbness in your arms or legs.  You develop nausea or vomiting.  You develop abdominal pain.  You feel faint. This information is not intended to replace advice given to you by your health care provider. Make sure you discuss any questions you have with your health care provider. Document Released: 11/11/2005 Document Revised: 03/21/2016  Document Reviewed: 03/15/2014 Elsevier Interactive Patient Education  2017 [employer].

## 2017-06-19 NOTE — Progress Notes (Signed)
Patient is here for back pian   Patient needs referral to neurosurgeon

## 2017-06-30 ENCOUNTER — Encounter (INDEPENDENT_AMBULATORY_CARE_PROVIDER_SITE_OTHER): Payer: Medicaid Other | Admitting: Physical Medicine and Rehabilitation

## 2017-07-07 ENCOUNTER — Encounter (INDEPENDENT_AMBULATORY_CARE_PROVIDER_SITE_OTHER): Payer: Self-pay | Admitting: Physical Medicine and Rehabilitation

## 2017-07-07 ENCOUNTER — Ambulatory Visit (INDEPENDENT_AMBULATORY_CARE_PROVIDER_SITE_OTHER): Payer: Self-pay | Admitting: Physical Medicine and Rehabilitation

## 2017-07-07 ENCOUNTER — Ambulatory Visit (INDEPENDENT_AMBULATORY_CARE_PROVIDER_SITE_OTHER): Payer: Medicaid Other

## 2017-07-07 VITALS — BP 125/91 | HR 145 | Ht 68.0 in | Wt 170.0 lb

## 2017-07-07 DIAGNOSIS — M5116 Intervertebral disc disorders with radiculopathy, lumbar region: Secondary | ICD-10-CM

## 2017-07-07 DIAGNOSIS — M5416 Radiculopathy, lumbar region: Secondary | ICD-10-CM

## 2017-07-07 MED ORDER — LIDOCAINE HCL (PF) 1 % IJ SOLN
2.0000 mL | Freq: Once | INTRAMUSCULAR | Status: AC
  Administered 2017-07-07: 2 mL

## 2017-07-07 MED ORDER — BETAMETHASONE SOD PHOS & ACET 6 (3-3) MG/ML IJ SUSP
12.0000 mg | Freq: Once | INTRAMUSCULAR | Status: AC
  Administered 2017-07-07: 12 mg

## 2017-07-07 NOTE — Progress Notes (Deleted)
Patient is here today for lumbar injection, has L4-5 disc protrusion and lateral recess stenosis left L4-5.  She is having left buttock pain, down the leg to the ankle, and has been going on for a year.  She takes tylenol pm qhs, no other meds.  Her pain is severe and she says she cannot take it anymore.  She says in the past these injections usually increase her pain, she is very anxious about having the injection today.

## 2017-07-07 NOTE — Patient Instructions (Signed)

## 2017-07-08 NOTE — Procedures (Signed)
[name] is a 46 year old female with chronic worsening one-year history of severe low back and left hip and leg pain somewhat of a nondermatomal distribution but also down the leg to the ankle and somewhat of an L5 distribution. She has MRI evidence of L4-5 disc protrusion and lateral recess narrowing. She was following with Dr. Joycelyn Schmid up until the end of June. She called in requesting pain medication and it looks like an oral swab indicated she had been taking cocaine. They did offer her tramadol which she refused. They also offered her appointment with the [place] which she refused. She saw Dr. Lorin Mercy in our office on 06/10/2017 and he suggested epidural injection at L4-5 to help her with her pain and diagnostically help figure out the cause of her symptoms. She saw her primary care physician at [facility] just a few days later and they referred her back to orthopedics. I'm not sure which orthopedics they referred to by looking at the referral note. Nonetheless she is having pain down the leg and is taking Tylenol with no relief. She says it's so severe she cannot take it anymore. She reports that epidural injections in the past have caused her extreme amount of pain that she is anxious today. I did tell her that we do epidural injections quite frequently and is usually not a source of increased pain afterwards. I did offer her the epidural injection diagnostically and hopefully therapeutically. Brief exam shows good distal strength without clonus. She does want to complete the injection today. She is asking for pain medications and I did have our staffs and Dr. Lorin Mercy of message concerning pain medications.   Lumbar Epidural Steroid Injection - Interlaminar Approach with Fluoroscopic Guidance  Patient: [name]      Date of Birth: [birth year] MRN: [id number] PCP: Alfonse Spruce, FNP      Visit Date: 07/07/2017   Universal Protocol:     Consent Given By: the  patient  Position: PRONE  Additional Comments: Vital signs were monitored before and after the procedure. Patient was prepped and draped in the usual sterile fashion. The correct patient, procedure, and site was verified.   Injection Procedure Details:  Procedure Site One Meds Administered:  Meds ordered this encounter  Medications  . lidocaine (PF) (XYLOCAINE) 1 % injection 2 mL  . betamethasone acetate-betamethasone sodium phosphate (CELESTONE) injection 12 mg     Laterality: Left  Location/Site:  L4-L5  Needle size: 20 G  Needle type: Tuohy  Needle Placement: Paramedian epidural  Findings:  -Contrast Used: 0.5 mL iohexol 180 mg iodine/mL   -Comments: Excellent flow of contrast into the epidural space.  Procedure Details: Using a paramedian approach from the side mentioned above, the region overlying the inferior lamina was localized under fluoroscopic visualization and the soft tissues overlying this structure were infiltrated with 4 ml. of 1% Lidocaine without Epinephrine. The Tuohy needle was inserted into the epidural space using a paramedian approach.   The epidural space was localized using loss of resistance along with lateral and bi-planar fluoroscopic views.  After negative aspirate for air, blood, and CSF, a 2 ml. volume of Isovue-250 was injected into the epidural space and the flow of contrast was observed. Radiographs were obtained for documentation purposes.    The injectate was administered into the level noted above.   Additional Comments:  The patient tolerated the procedure well Dressing: Band-Aid    Post-procedure details: Patient was observed during the procedure. Post-procedure  instructions were reviewed.  Patient left the clinic in stable condition.

## 2017-07-11 ENCOUNTER — Telehealth (INDEPENDENT_AMBULATORY_CARE_PROVIDER_SITE_OTHER): Payer: Self-pay | Admitting: Orthopaedic Surgery

## 2017-07-11 NOTE — Telephone Encounter (Signed)
Returned call to patient left message to call back. 

## 2017-07-18 ENCOUNTER — Encounter (INDEPENDENT_AMBULATORY_CARE_PROVIDER_SITE_OTHER): Payer: Self-pay | Admitting: Orthopaedic Surgery

## 2017-07-18 ENCOUNTER — Ambulatory Visit (INDEPENDENT_AMBULATORY_CARE_PROVIDER_SITE_OTHER): Payer: Self-pay | Admitting: Orthopaedic Surgery

## 2017-07-18 VITALS — BP 125/90 | HR 116 | Ht 68.0 in | Wt 179.0 lb

## 2017-07-18 DIAGNOSIS — M5126 Other intervertebral disc displacement, lumbar region: Secondary | ICD-10-CM

## 2017-07-18 DIAGNOSIS — M5136 Other intervertebral disc degeneration, lumbar region: Secondary | ICD-10-CM

## 2017-07-18 MED ORDER — ACETAMINOPHEN-CODEINE #3 300-30 MG PO TABS: 1.0000 | ORAL_TABLET | Freq: Three times a day (TID) | ORAL | 0 refills | Status: DC | PRN

## 2017-07-18 MED ORDER — ACETAMINOPHEN-CODEINE #3 300-30 MG PO TABS: 1.0000 | ORAL_TABLET | ORAL | 0 refills | Status: DC | PRN

## 2017-07-18 NOTE — Progress Notes (Addendum)
Office Visit Note   Patient: [name]           Date of Birth: 30-Sep-1971           MRN: [id number] Visit Date: 07/18/2017              Requested by: Alfonse Spruce, [place], [place] [address] PCP: Alfonse Spruce, FNP   Assessment & Plan: Visit Diagnoses:  1. Lumbar degenerative disc disease   2. Protrusion of lumbar intervertebral disc     Plan:  Patient states she is not to stand the pain any  longer. Prescription given for Tylenol No. 3. We discussed options she understands that the MRI scan shows progressive disc  protrusion on the left side. We discussed  left L4-5 microdiscectomy potential risks for recurrence of disc herniation progressive disc degeneration, dural tear, possible  reoperation discussed. Questions were was illecited and  Answered, she understands request to proceed.  Follow-Up Instructions: No Follow-up on file.   Orders:  No orders of the defined types were placed in this encounter.  Meds ordered this encounter  Medications  . acetaminophen-codeine (TYLENOL #3) 300-30 MG tablet    Sig: Take 1-2 tablets by mouth every 4 (four) hours as needed for moderate pain.    Dispense:  30 tablet    Refill:  0  . acetaminophen-codeine (TYLENOL #3) 300-30 MG tablet    Sig: Take 1 tablet by mouth every 8 (eight) hours as needed for moderate pain.    Dispense:  30 tablet    Refill:  0      Procedures: No procedures performed   Clinical Data: No additional findings.   Subjective: Chief Complaint  Patient presents with  . Lower Back - Pain, Fracture    HPI 46 year old female returns post epidural 07/07/2017 lumbar and states that she did not get any relief with the injection. She's had gradually worsening back and left leg pain for greater than a year in a nondermatomal distribution. She had some L4-5 disc protrusion and lateral recess narrowing. She is on chronic pain medication and day oral swab was positive for cocaine.  Previous epidural injection she states only made her pain worse. Originally she did not want to have the injection but did proceed injection was done at L4-5.  Review of Systems 14 point review of systems is updated and is unchanged from 06/10/2017 VISIT. Patient is a pack and half per day smoker 30 years.   Objective: Vital Signs: BP 125/90   Pulse (!) 116   Ht 5\' 8"  (1.727 m)   Wt 179 lb (81.2 kg)   BMI 27.22 kg/m   Physical Exam  Constitutional: She is oriented to person, place, and time. She appears well-developed.  HENT:  Head: Normocephalic.  Right Ear: External ear normal.  Left Ear: External ear normal.  Eyes: Pupils are equal, round, and reactive to light.  Neck: No tracheal deviation present. No thyromegaly present.  Cardiovascular: Normal rate.   Pulmonary/Chest: Effort normal.  Abdominal: Soft.  Neurological: She is alert and oriented to person, place, and time.  Skin: Skin is warm and dry.  Psychiatric: She has a normal mood and affect. Her behavior is normal.    Ortho Exam patient has intact reflexes and ankle jerk 2+ and symmetrical. Anterior tib EHL toe flexors extensors are intact peroneal center tib are strong. No quad weakness.  Specialty Comments:  No specialty comments available.  Imaging: CLINICAL DATA:  46 year old  female with 6 months of lumbar back pain. Injury in November 2017. Pain radiating down the left side to the buttock and leg.  EXAM: MRI LUMBAR SPINE WITHOUT CONTRAST  TECHNIQUE: Multiplanar, multisequence MR imaging of the lumbar spine was performed. No intravenous contrast was administered.  COMPARISON:  Lumbar radiographs 01/12/2017. [facility] lumbar MRI 06/17/2014  FINDINGS: Segmentation: Normal as seen on the recent radiographs, and this is the same numbering system used on the 2015 MRI.  Alignment:  Stable, with straightening of lumbar lordosis.  Vertebrae: No marrow edema or evidence of  acute osseous abnormality. Visualized bone marrow signal is within normal limits. Negative visible sacrum and SI joints.  Conus medullaris: Extends to the T12-L1 level and appears normal.  Paraspinal and other soft tissues: Negative.  Disc levels:  T11-T12:  Negative.  T12-L1:  Negative.  L1-L2:  Negative.  L2-L3: Negative except for chronic borderline to mild facet hypertrophy.  L3-L4:  Negative disc.  Stable mild facet hypertrophy.  No stenosis.  L4-L5: Progressed mild disc desiccation. Mild circumferential disc bulge. Chronic left foraminal but new left lateral recess small to moderate disc protrusion (series 7, images 25 and 26). Mild facet and ligament flavum hypertrophy. New mild to moderate left lateral recess stenosis at the level of the descending left L5 nerve roots. Mild to moderate left L4 neural foraminal stenosis is stable to mildly increased. No spinal or right foraminal stenosis.  L5-S1:  Negative.  IMPRESSION: 1. Progressed disc degeneration at L4-L5 since 2015. Persistent broad-based left foraminal disc herniation, but with a new left lateral recess component. New mild to moderate lateral recess stenosis at the level of the descending L5 nerve roots. Mild to moderate left L4 foraminal stenosis is stable to mildly increased. 2. Stable and negative lumbar spine elsewhere except for mild facet hypertrophy.   Electronically Signed   By: Genevie Ann M.D.   On: 03/21/2017 15:16    PMFS History: Patient Active Problem List   Diagnosis Date Noted  . Left lumbar radiculitis 04/25/2017  . Lumbar degenerative disc disease 04/25/2017   Past Medical History:  Diagnosis Date  . Asthma   . COPD (chronic obstructive pulmonary disease) ([place])   . Irregular heart rate   . Renal disorder    kidney infections    Family History  Problem Relation Age of Onset  . Hypertension Mother   . Hypertension Father   . Heart disease Father     Past  Surgical History:  Procedure Laterality Date  . APPENDECTOMY    . TUBAL LIGATION    . TUBAL LIGATION  1998   Social History   Occupational History  . Not on file.   Social History Main Topics  . Smoking status: Current Every Day Smoker    Packs/day: 1.50    Years: 30.00    Types: Cigarettes  . Smokeless tobacco: Never Used  . Alcohol use Yes     Comment: rare  . Drug use: No  . Sexual activity: Not on file

## 2017-07-22 ENCOUNTER — Ambulatory Visit (INDEPENDENT_AMBULATORY_CARE_PROVIDER_SITE_OTHER): Payer: Medicaid Other | Admitting: Orthopaedic Surgery

## 2017-07-29 ENCOUNTER — Telehealth (INDEPENDENT_AMBULATORY_CARE_PROVIDER_SITE_OTHER): Payer: Self-pay

## 2017-07-29 MED ORDER — ACETAMINOPHEN-CODEINE #3 300-30 MG PO TABS: 1.0000 | ORAL_TABLET | Freq: Three times a day (TID) | ORAL | 0 refills | Status: DC | PRN

## 2017-07-29 NOTE — Addendum Note (Signed)
Addended by: Minda Ditto, Geoffery Spruce on: 07/29/2017 05:37 PM   Modules accepted: Orders

## 2017-07-29 NOTE — Telephone Encounter (Signed)
Please see messages below and advise.

## 2017-07-29 NOTE — Telephone Encounter (Signed)
Please see messages below and advise. Thanks.

## 2017-07-29 NOTE — Telephone Encounter (Signed)
OK refill tylenol # 3 same as before.

## 2017-07-29 NOTE — Telephone Encounter (Signed)
Patient would like call back about getting surgery approved. Also wanted to let you know ESI has not helped at all. Is in severe pain. Would like call back. Stated she is bed bound. Please call her back [phone]

## 2017-07-29 NOTE — Telephone Encounter (Signed)
I called and lmom that her rx was going to be call in to [facility]) [address]--Rx'd in amd left on message

## 2017-07-29 NOTE — Telephone Encounter (Signed)
I left voicemail for patient regarding surgery approval. She had stated that her Medicaid went into effect on 07/26/2017.  I advised that we would begin seeking approval for her surgery, however, it does take some time to get authorization from [facility].   I also advised I would send message to Dr. Lorin Mercy about severe pain and being bed bound in case there is something that he can recommend for her. He prescribed Tylenol #3 at last visit.

## 2017-08-20 ENCOUNTER — Telehealth (INDEPENDENT_AMBULATORY_CARE_PROVIDER_SITE_OTHER): Payer: Self-pay

## 2017-08-20 NOTE — Telephone Encounter (Signed)
Please advise 

## 2017-08-20 NOTE — Telephone Encounter (Signed)
Patient called stating that she is currently awaiting for medicaid to be approved for her to have surgery, but stated that when she gets up to stand for more than 5 minutes, her feet turn purple.  Would like a call back.  Cb# is [phone].  Please advise.  Thank you.

## 2017-08-21 NOTE — Telephone Encounter (Signed)
I called discussed. Normal color with elevation. Discussed not related with lumbar problem.

## 2017-09-19 ENCOUNTER — Telehealth (INDEPENDENT_AMBULATORY_CARE_PROVIDER_SITE_OTHER): Payer: Self-pay | Admitting: Orthopedic Surgery

## 2017-09-19 NOTE — Telephone Encounter (Signed)
She will have to talk with medicaid to get it approved. If this does not work she can be sent for 2nd opinion.

## 2017-09-19 NOTE — Telephone Encounter (Signed)
[name] is pending Medicaid approval for L4-5 microdiscectomy.  This is still not authorized by Medicaid. She states that she is bed bound and cannot do anything.  States "I have no quality of life".  She would like to know what her next step should be since insurance is taking so long to approve surgery.

## 2017-10-13 ENCOUNTER — Telehealth (INDEPENDENT_AMBULATORY_CARE_PROVIDER_SITE_OTHER): Payer: Self-pay | Admitting: Orthopaedic Surgery

## 2017-10-13 DIAGNOSIS — M5136 Other intervertebral disc degeneration, lumbar region: Secondary | ICD-10-CM

## 2017-10-13 NOTE — Telephone Encounter (Signed)
Patient is still waiting to get approved for her surgery through Medicare, has not heard a response. Patient states she is in a lot of pain and has been laying in bed for months and wants to know if theres anything you can do for her while she waits. (Medication or therapy) Patients # is [phone]

## 2017-10-13 NOTE — Telephone Encounter (Signed)
Please advise 

## 2017-10-13 NOTE — Telephone Encounter (Signed)
Ok to send her for PT for one visit for HEP for core strengthening. She has medicaid and they only approve one visit.

## 2017-10-13 NOTE — Telephone Encounter (Signed)
Patient checking on status - saying we were supposed to be checking with Medicaid approval of her surgery and giving her a call back. She said it has been months and she is just laying in bed in pain. Please advise- CB# is [phone].

## 2017-10-14 NOTE — Telephone Encounter (Signed)
Can you please check on status and see if there has been any update?  Thank you!

## 2017-10-15 NOTE — Telephone Encounter (Signed)
Entered. Patient called back and was advised.

## 2017-10-15 NOTE — Addendum Note (Signed)
Addended by: Meyer Cory on: 10/15/2017 04:19 PM   Modules accepted: Orders

## 2017-10-23 ENCOUNTER — Ambulatory Visit: Payer: Medicaid Other | Attending: Orthopaedic Surgery | Admitting: Physical Therapy

## 2017-12-16 ENCOUNTER — Telehealth (INDEPENDENT_AMBULATORY_CARE_PROVIDER_SITE_OTHER): Payer: Self-pay | Admitting: Orthopaedic Surgery

## 2017-12-16 NOTE — Telephone Encounter (Signed)
Fyi.

## 2017-12-16 NOTE — Telephone Encounter (Signed)
I called patient to let her know that medicaid had denied her surgery with Dr. Lorin Mercy for her back.  Patient wanted to know if an explanation was given as to why it was not approved.  I did let her know that we are not given an explanation, however, she may find it helpful to contact her case worker at [facility] to gain some answers. Patient states she would do that today.

## 2017-12-29 ENCOUNTER — Telehealth (INDEPENDENT_AMBULATORY_CARE_PROVIDER_SITE_OTHER): Payer: Self-pay | Admitting: Orthopaedic Surgery

## 2017-12-29 NOTE — Telephone Encounter (Signed)
Please call pt to sched surgery medicaid is requesting more information from our office. Pt called and would really like to speak to our office. Medicaid stated they requested information from our office the week of dec 25th-31 and they never recived the info back. This Information needed to be completed by Jan 15th. Pt is currently waiting on approval from our office. Pt is taking a risk of denial of surgery.

## 2017-12-29 NOTE — Telephone Encounter (Signed)
Fyi. This message was sent to [facility] as well.

## 2018-02-12 ENCOUNTER — Encounter (INDEPENDENT_AMBULATORY_CARE_PROVIDER_SITE_OTHER): Payer: Self-pay | Admitting: Surgery

## 2018-02-12 ENCOUNTER — Ambulatory Visit (INDEPENDENT_AMBULATORY_CARE_PROVIDER_SITE_OTHER): Payer: Medicaid Other | Admitting: Surgery

## 2018-02-12 DIAGNOSIS — M5126 Other intervertebral disc displacement, lumbar region: Secondary | ICD-10-CM | POA: Diagnosis not present

## 2018-02-12 NOTE — Progress Notes (Signed)
Office Visit Note   Patient: [name]           Date of Birth: 01-16-1971           MRN: [id number] Visit Date: 02/12/2018              Requested by: Alfonse Spruce, FNP No address on file PCP: Alfonse Spruce, FNP   Assessment & Plan: Visit Diagnoses:  1. Protrusion of lumbar intervertebral disc     Plan: With patient's ongoing and worsening symptoms since her office visit previous lumbar MRI from April 2018 I recommend that she get a repeat study to make sure that other levels are not affected or she has not had progression at the L4-5 level.  We will follow-up in the office with Dr. Lorin Mercy completion to discuss results and surgical options.  She has had a significant negative impact on her quality of life due to the ongoing issue.    Follow-Up Instructions: Return in about 3 weeks (around 03/05/2018) for with Dr Lorin Mercy to review lumbar MRI.   Orders:  No orders of the defined types were placed in this encounter.  No orders of the defined types were placed in this encounter.     Procedures: No procedures performed   Clinical Data: No additional findings.   Subjective: Chief Complaint  Patient presents with  . Lower Back - Pain, Follow-up    HPI 47 year old white female returns with worsening low back pain and left lower extremity radiculopathy.  She feels like her symptoms have progressively gotten worse since last office visit with Dr. Lorin Mercy in August 2018.  Radiates from her low back to the left buttock.  Numbness and tingling also in this area.  Symptoms are described as being constant but worse with movement.  No complaints of bowel or bladder incontinence.  No symptoms on the right side.  MRI lumbar spine from showed  EXAM: MRI LUMBAR SPINE WITHOUT CONTRAST  TECHNIQUE: Multiplanar, multisequence MR imaging of the lumbar spine was performed. No intravenous contrast was administered.  COMPARISON:  Lumbar radiographs 01/12/2017. [facility] lumbar MRI 06/17/2014  FINDINGS: Segmentation: Normal as seen on the recent radiographs, and this is the same numbering system used on the 2015 MRI.  Alignment:  Stable, with straightening of lumbar lordosis.  Vertebrae: No marrow edema or evidence of acute osseous abnormality. Visualized bone marrow signal is within normal limits. Negative visible sacrum and SI joints.  Conus medullaris: Extends to the T12-L1 level and appears normal.  Paraspinal and other soft tissues: Negative.  Disc levels:  T11-T12:  Negative.  T12-L1:  Negative.  L1-L2:  Negative.  L2-L3: Negative except for chronic borderline to mild facet hypertrophy.  L3-L4:  Negative disc.  Stable mild facet hypertrophy.  No stenosis.  L4-L5: Progressed mild disc desiccation. Mild circumferential disc bulge. Chronic left foraminal but new left lateral recess small to moderate disc protrusion (series 7, images 25 and 26). Mild facet and ligament flavum hypertrophy. New mild to moderate left lateral recess stenosis at the level of the descending left L5 nerve roots. Mild to moderate left L4 neural foraminal stenosis is stable to mildly increased. No spinal or right foraminal stenosis.  L5-S1:  Negative.  IMPRESSION: 1. Progressed disc degeneration at L4-L5 since 2015. Persistent broad-based left foraminal disc herniation, but with a new left lateral recess component. New mild to moderate lateral recess stenosis at the level of the descending L5 nerve roots. Mild to moderate left  L4 foraminal stenosis is stable to mildly increased. 2. Stable and negative lumbar spine elsewhere except for mild facet hypertrophy.   Electronically Signed   By: Genevie Ann M.D.   On: 03/21/2017 15:16  Surgery was recommended by Dr. Lorin Mercy in August 2018 but there was some delay with Medicaid.  Patient is hoping to be able to have surgery as soon as possible.  Review of Systems No current  cardiac pulmonary GI GU issues  Objective: Vital Signs: There were no vitals taken for this visit.  Physical Exam  Constitutional: She is oriented to person, place, and time. She appears distressed (Patient is tearful.  Appears to be in a moderate level of discomfort.).  HENT:  Head: Normocephalic.  Eyes: Pupils are equal, round, and reactive to light. EOM are normal.  Neck: Normal range of motion.  Pulmonary/Chest: No respiratory distress.  Musculoskeletal:  Positive lumbar paraspinal tenderness.  Gait antalgic.  Positive left straight leg raise.  Neurovascular intact.  No focal motor deficits.  Neurological: She is alert and oriented to person, place, and time.  Skin: Skin is warm and dry.    Ortho Exam  Specialty Comments:  No specialty comments available.  Imaging: No results found.   PMFS History: Patient Active Problem List   Diagnosis Date Noted  . Left lumbar radiculitis 04/25/2017  . Lumbar degenerative disc disease 04/25/2017   Past Medical History:  Diagnosis Date  . Asthma   . COPD (chronic obstructive pulmonary disease) ([place])   . Irregular heart rate   . Renal disorder    kidney infections    Family History  Problem Relation Age of Onset  . Hypertension Mother   . Hypertension Father   . Heart disease Father     Past Surgical History:  Procedure Laterality Date  . APPENDECTOMY    . TUBAL LIGATION    . TUBAL LIGATION  1998   Social History   Occupational History  . Not on file  Tobacco Use  . Smoking status: Current Every Day Smoker    Packs/day: 1.50    Years: 30.00    Pack years: 45.00    Types: Cigarettes  . Smokeless tobacco: Never Used  Substance and Sexual Activity  . Alcohol use: Yes    Comment: rare  . Drug use: No  . Sexual activity: Not on file

## 2018-02-27 ENCOUNTER — Other Ambulatory Visit (INDEPENDENT_AMBULATORY_CARE_PROVIDER_SITE_OTHER): Payer: Self-pay | Admitting: Surgery

## 2018-02-27 DIAGNOSIS — M5416 Radiculopathy, lumbar region: Secondary | ICD-10-CM

## 2018-03-10 ENCOUNTER — Ambulatory Visit (INDEPENDENT_AMBULATORY_CARE_PROVIDER_SITE_OTHER): Payer: Medicaid Other | Admitting: Orthopaedic Surgery

## 2018-03-13 ENCOUNTER — Ambulatory Visit
Admission: RE | Admit: 2018-03-13 | Discharge: 2018-03-13 | Disposition: A | Discharge status: Home / Self Care | Payer: Medicaid Other | Source: Ambulatory Visit | Attending: Surgery | Admitting: Surgery

## 2018-03-13 DIAGNOSIS — M5416 Radiculopathy, lumbar region: Secondary | ICD-10-CM

## 2018-03-25 ENCOUNTER — Encounter (INDEPENDENT_AMBULATORY_CARE_PROVIDER_SITE_OTHER): Payer: Self-pay | Admitting: Orthopaedic Surgery

## 2018-03-25 ENCOUNTER — Ambulatory Visit (INDEPENDENT_AMBULATORY_CARE_PROVIDER_SITE_OTHER): Payer: Medicaid Other | Admitting: Orthopaedic Surgery

## 2018-03-25 VITALS — BP 134/89 | HR 102 | Ht 68.0 in | Wt 192.0 lb

## 2018-03-25 DIAGNOSIS — M5116 Intervertebral disc disorders with radiculopathy, lumbar region: Secondary | ICD-10-CM | POA: Diagnosis not present

## 2018-03-25 NOTE — Progress Notes (Signed)
Office Visit Note   Patient: [name]           Date of Birth: 1971-04-14           MRN: [id number] Visit Date: 03/25/2018              Requested by: Alfonse Spruce, FNP No address on file PCP: Alfonse Spruce, FNP   Assessment & Plan: Visit Diagnoses:  1. Lumbar disc herniation with radiculopathy     Plan: Patient has L4-5 left HNP with radiculopathy.  She has had persistent symptoms and is failed conservative treatment.  She is had epidurals anti-inflammatories prednisone Dosepak muscle relaxants, Neurontin, Tylenol, home exercise program.  She is been symptomatic over a year with MRI with persistent disc protrusion or nerve compression.  She has not been able to resume work and is anxious to get back to working as she used to for many years.  Plan would be single level L4-5 left microdiscectomy overnight stay in the hospital.  After 6 weeks she could resume work activities.  Risk of repeat disc herniation discussed.  She only has single level disc problem all other levels are normal.  Risks of infection bleeding discussed we have asked her to cut back on her smoking to help with her pulmonary function after the surgery.  I have encouraged her in the past to quit which we have discussed.  Questions were elicited and answered she understands and requests we proceed.  Follow-Up Instructions: No follow-ups on file.   Orders:  No orders of the defined types were placed in this encounter.  No orders of the defined types were placed in this encounter.     Procedures: No procedures performed   Clinical Data: No additional findings.   Subjective: Chief Complaint  Patient presents with  . Lower Back - Pain, Follow-up    MRI review    HPI 47 year old female returns with ongoing problems with back pain and left leg pain.  She states she is had constant pain at times it flares severe enough she is having difficulty walking.  She is noticed weakness in her left leg  sometimes catches her foot and stumbles.  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  She is had 3 epidural injections without relief.  She has been on anti-inflammatories.  She is trying to avoid narcotic medication and has had Tylenol with codeine.  Currently she is taking Tylenol.  MRI scan last year showed similar findings and new MRI scan was obtained which again shows persistent disc herniation on the left at L4-5 with foraminal component and nerve compression.  Other levels are normal.  Review of Systems 14 point review of systems updated positive for history of half pack per day smoker x30 years.  Patient has 3 daughters vaginal delivery.  She had epidurals for L4-5 H&P in the past x3 without relief.  She has been on Neurontin, Robaxin, prednisone pack.  She uses inhalers for asthma.  Otherwise negative as it pertains HPI.   Objective: Vital Signs: BP 134/89   Pulse (!) 102   Ht 5\' 8"  (1.727 m)   Wt 192 lb (87.1 kg)   BMI 29.19 kg/m   Physical Exam  Constitutional: She is oriented to person, place, and time. She appears well-developed.  HENT:  Head: Normocephalic.  Right Ear: External ear normal.  Left Ear: External ear normal.  Eyes: Pupils are equal, round, and reactive to light.  Neck: No tracheal deviation present. No thyromegaly present.  Cardiovascular: Normal rate.  Pulmonary/Chest: Effort normal.  Abdominal: Soft.  Neurological: She is alert and oriented to person, place, and time.  Skin: Skin is warm and dry.  Psychiatric: She has a normal mood and affect. Her behavior is normal.    Ortho Exam patient has an antalgic gait.  She is able to toe walk but has problems with left partial foot drop with heel walking.  Anterior tib is weak on the left as well as EHL 1 grade.  Absent right foot is strong.  Positive popliteal compression test positive sciatic notch.tenderness.  Skin over the lumbar spine  is normal.  Quads are strong normal hip range of motion.  Collateral ligaments of the knee are normal distal pulses are 2+ and symmetrical.  She has some decreased sensation dorsum of her left foot.  Normal on the right foot.  Plantar lateral foot sensation is normal and symmetrical.  Specialty Comments:  No specialty comments available.  Imaging:Study Result   CLINICAL DATA:  Lifting injury 1 year ago. Pain with bending. Bilateral leg pain and weakness. Radiculopathy, lumbar region.  EXAM: MRI LUMBAR SPINE WITHOUT CONTRAST  TECHNIQUE: Multiplanar, multisequence MR imaging of the lumbar spine was performed. No intravenous contrast was administered.  COMPARISON:  MRI of the lumbar spine 03/19/2017 and 06/17/2014  FINDINGS: Segmentation: 5 non rib-bearing lumbar type vertebral bodies are present.  Alignment:  AP alignment is anatomic.  Vertebrae:  Marrow signal and vertebral body heights are normal.  Conus medullaris and cauda equina: Conus extends to the T12-L1 level. Conus and cauda equina appear normal.  Paraspinal and other soft tissues: Limited imaging of the abdomen is unremarkable. There is no significant adenopathy.  Disc levels:  The disc levels at L3-4 and above are normal.  L4-5: A leftward disc protrusion is again seen. Moderate left subarticular stenosis is similar the prior exam. The disc extends into the left neural foramen without significant change in moderate left foraminal stenosis. The right foramen is patent.  L5-S1: Negative.  IMPRESSION: 1. Similar appearance of left paramedian disc protrusion resulting in moderate left subarticular and foraminal stenosis. 2. No other significant focal disc disease or stenosis in the lumbar spine.   Electronically Signed   By: San Morelle M.D.   On: 03/13/2018 10:23       PMFS History: Patient Active Problem List   Diagnosis Date Noted  . Left lumbar radiculitis 04/25/2017  .  Lumbar degenerative disc disease 04/25/2017   Past Medical History:  Diagnosis Date  . Asthma   . COPD (chronic obstructive pulmonary disease) ([place])   . Irregular heart rate   . Renal disorder    kidney infections    Family History  Problem Relation Age of Onset  . Hypertension Mother   . Hypertension Father   . Heart disease Father     Past Surgical History:  Procedure Laterality Date  . APPENDECTOMY    . TUBAL LIGATION    . TUBAL LIGATION  1998   Social History   Occupational History  . Not on file  Tobacco Use  . Smoking status: Current Every Day Smoker    Packs/day: 1.50    Years: 30.00    Pack years: 45.00    Types: Cigarettes  . Smokeless tobacco: Never Used  Substance and Sexual Activity  . Alcohol use: Yes    Comment: rare  . Drug  use: No  . Sexual activity: Not on file

## 2018-04-27 ENCOUNTER — Emergency Department (HOSPITAL_COMMUNITY)
Admission: EM | Admit: 2018-04-27 | Discharge: 2018-04-27 | Disposition: A | Discharge status: Home / Self Care | Payer: Medicaid Other | Attending: Emergency Medicine | Admitting: Emergency Medicine

## 2018-04-27 ENCOUNTER — Telehealth (INDEPENDENT_AMBULATORY_CARE_PROVIDER_SITE_OTHER): Payer: Self-pay | Admitting: Orthopaedic Surgery

## 2018-04-27 ENCOUNTER — Emergency Department (HOSPITAL_COMMUNITY): Payer: Medicaid Other

## 2018-04-27 ENCOUNTER — Other Ambulatory Visit: Payer: Self-pay

## 2018-04-27 ENCOUNTER — Encounter (HOSPITAL_COMMUNITY): Payer: Self-pay

## 2018-04-27 DIAGNOSIS — X501XXA Overexertion from prolonged static or awkward postures, initial encounter: Secondary | ICD-10-CM | POA: Diagnosis not present

## 2018-04-27 DIAGNOSIS — Z79899 Other long term (current) drug therapy: Secondary | ICD-10-CM | POA: Insufficient documentation

## 2018-04-27 DIAGNOSIS — Y92009 Unspecified place in unspecified non-institutional (private) residence as the place of occurrence of the external cause: Secondary | ICD-10-CM | POA: Insufficient documentation

## 2018-04-27 DIAGNOSIS — S93402A Sprain of unspecified ligament of left ankle, initial encounter: Secondary | ICD-10-CM | POA: Diagnosis not present

## 2018-04-27 DIAGNOSIS — J449 Chronic obstructive pulmonary disease, unspecified: Secondary | ICD-10-CM | POA: Insufficient documentation

## 2018-04-27 DIAGNOSIS — F17210 Nicotine dependence, cigarettes, uncomplicated: Secondary | ICD-10-CM | POA: Diagnosis not present

## 2018-04-27 DIAGNOSIS — Y999 Unspecified external cause status: Secondary | ICD-10-CM | POA: Insufficient documentation

## 2018-04-27 DIAGNOSIS — S99912A Unspecified injury of left ankle, initial encounter: Secondary | ICD-10-CM | POA: Diagnosis present

## 2018-04-27 DIAGNOSIS — Y939 Activity, unspecified: Secondary | ICD-10-CM | POA: Diagnosis not present

## 2018-04-27 MED ORDER — HYDROCODONE-ACETAMINOPHEN 5-325 MG PO TABS
2.0000 | ORAL_TABLET | Freq: Once | ORAL | Status: AC
  Administered 2018-04-27: 2 via ORAL
  Filled 2018-04-27: qty 2

## 2018-04-27 MED ORDER — HYDROCODONE-ACETAMINOPHEN 5-325 MG PO TABS: 1.0000 | ORAL_TABLET | ORAL | 0 refills | Status: DC | PRN

## 2018-04-27 NOTE — ED Provider Notes (Signed)
[facility] DEPT Provider Note   CSN: [id number] Arrival date & time: 04/27/18  0708     History   Chief Complaint Chief Complaint  Patient presents with  . Ankle Pain    HPI [name] is a 47 y.o. female.  The history is provided by the patient. No language interpreter was used.  Ankle Pain   The incident occurred 1 to 2 hours ago. The incident occurred at home. The injury mechanism was torsion. The quality of the pain is described as aching. The pain is moderate. The pain has been constant since onset. She reports no foreign bodies present. Nothing aggravates the symptoms. She has tried nothing for the symptoms. The treatment provided no relief.  Pt reports she turned her ankle this am.  Pt complains of swelling and pain to her foot and ankle.   Past Medical History:  Diagnosis Date  . Asthma   . COPD (chronic obstructive pulmonary disease) ([place])   . Irregular heart rate   . Renal disorder    kidney infections    Patient Active Problem List   Diagnosis Date Noted  . Left lumbar radiculitis 04/25/2017  . Lumbar degenerative disc disease 04/25/2017    Past Surgical History:  Procedure Laterality Date  . APPENDECTOMY    . TUBAL LIGATION    . TUBAL LIGATION  1998     OB History   None      Home Medications    Prior to Admission medications   Medication Sig Start Date End Date Taking? Authorizing Provider  albuterol (PROVENTIL HFA;VENTOLIN HFA) 108 (90 Base) MCG/ACT inhaler Inhale 2 puffs into the lungs every 6 (six) hours as needed for wheezing or shortness of breath. 03/13/17  Yes Hairston, Mandesia R, FNP  diphenhydramine-acetaminophen (TYLENOL PM) 25-500 MG TABS tablet Take 2 tablets by mouth at bedtime as needed (pain).   Yes [provider]  fluticasone (FLONASE) 50 MCG/ACT nasal spray Place 1 spray into both nostrils daily as needed for allergies or rhinitis.   Yes [provider]  Menthol, Topical  Analgesic, (BIOFREEZE EX) Apply 1 application topically daily as needed (back pain).   Yes [provider]  acetaminophen-codeine (TYLENOL #3) 300-30 MG tablet Take 1-2 tablets by mouth every 4 (four) hours as needed for moderate pain. Patient not taking: Reported on 02/12/2018 07/18/17   Marybelle Killings, MD  acetaminophen-codeine (TYLENOL #3) 300-30 MG tablet Take 1 tablet by mouth every 8 (eight) hours as needed for moderate pain. Patient not taking: Reported on 02/12/2018 07/29/17   Marybelle Killings, MD  gabapentin (NEURONTIN) 300 MG capsule Take 1 capsule (300 mg total) by mouth 3 (three) times daily. Patient not taking: Reported on 06/10/2017 04/25/17   Charlett Blake, MD  ibuprofen (ADVIL,MOTRIN) 600 MG tablet Take 1 tablet (600 mg total) by mouth every 6 (six) hours as needed (Take with food.). Patient not taking: Reported on 06/10/2017 03/13/17   Alfonse Spruce, FNP  lidocaine (LMX) 4 % cream Apply 1 application topically 3 (three) times daily as needed. Patient not taking: Reported on 06/10/2017 04/25/17   Charlett Blake, MD  methocarbamol (ROBAXIN) 750 MG tablet Take 750 mg by mouth. 01/05/17   [provider]  methylPREDNISolone (MEDROL DOSEPAK) 4 MG TBPK tablet follow package directions Patient not taking: Reported on 04/27/2018 05/13/17   Charlett Blake, MD  naproxen (NAPROSYN) 500 MG tablet Take 1 tablet (500 mg total) by mouth 2 (two) times daily  with a meal. For 10 days. Then take one tablet mouth daily as needed. Patient not taking: Reported on 07/07/2017 06/19/17   Alfonse Spruce, FNP    Family History Family History  Problem Relation Age of Onset  . Hypertension Mother   . Hypertension Father   . Heart disease Father     Social History Social History   Tobacco Use  . Smoking status: Current Every Day Smoker    Packs/day: 1.50    Years: 30.00    Pack years: 45.00    Types: Cigarettes  . Smokeless tobacco: Never Used  Substance Use Topics  .  Alcohol use: Yes    Comment: rare  . Drug use: No     Allergies   Bactrim [sulfamethoxazole-trimethoprim]   Review of Systems Review of Systems  All other systems reviewed and are negative.    Physical Exam Updated Vital Signs BP 113/84   Pulse 92   Temp 98.3 F (36.8 C) (Oral)   Resp 14   Ht 5\' 8"  (1.727 m)   Wt 87.1 kg (192 lb)   LMP 04/24/2018   SpO2 98%   BMI 29.19 kg/m   Physical Exam  Constitutional: She appears well-developed and well-nourished.  HENT:  Head: Normocephalic.  Eyes: Pupils are equal, round, and reactive to light.  Cardiovascular: Normal rate.  Pulmonary/Chest: Effort normal.  Musculoskeletal: She exhibits edema and tenderness.  Bruised lateral malleolus and left upper foot,  Pain with range of motion,  nv and ns intact   Neurological: She is alert.  Skin: Skin is warm.  Psychiatric: She has a normal mood and affect.  Nursing note and vitals reviewed.    ED Treatments / Results  Labs (all labs ordered are listed, but only abnormal results are displayed) Labs Reviewed - No data to display  EKG None  Radiology Dg Ankle Complete Left  Result Date: 04/27/2018 CLINICAL DATA:  Left ankle pain after fall. EXAM: LEFT ANKLE COMPLETE - 3+ VIEW COMPARISON:  None. FINDINGS: There is no evidence of fracture, dislocation, or joint effusion. There is no evidence of arthropathy or other focal bone abnormality. Soft tissues are unremarkable. IMPRESSION: Normal left ankle. Electronically Signed   By: Marijo Conception, M.D.   On: 04/27/2018 08:24   Dg Foot Complete Left  Result Date: 04/27/2018 CLINICAL DATA:  Left foot pain after fall. EXAM: LEFT FOOT - COMPLETE 3+ VIEW COMPARISON:  Radiographs of February 09, 2015. FINDINGS: There is no evidence of fracture or dislocation. There is no evidence of arthropathy or other focal bone abnormality. Soft tissues are unremarkable. IMPRESSION: Normal left foot. Electronically Signed   By: Marijo Conception, M.D.   On:  04/27/2018 08:26    Procedures Procedures (including critical care time)  Medications Ordered in ED Medications  HYDROcodone-acetaminophen (NORCO/VICODIN) 5-325 MG per tablet 2 tablet (2 tablets Oral Given 04/27/18 9147)     Initial Impression / Assessment and Plan / ED Course  I have reviewed the triage vital signs and the nursing notes.  Pertinent labs & imaging results that were available during my care of the patient were reviewed by me and considered in my medical decision making (see chart for details).     MDM   Xrays reviewd no fracture.  Pt given 2 hydrocodone for pain.  Pt advised ice and elevate Pt advised to see Dr. Lorin Mercy for recheck in 1 week for recheck if pain persist  Final Clinical Impressions(s) / ED Diagnoses   Final  diagnoses:  Sprain of left ankle, unspecified ligament, initial encounter    ED Discharge Orders        Ordered    HYDROcodone-acetaminophen (NORCO/VICODIN) 5-325 MG tablet  Every 4 hours PRN     04/27/18 0844    An After Visit Summary was printed and given to the patient.    Fransico Meadow, PA-C 04/27/18 7494    Jola Schmidt, MD 04/28/18 [phone]

## 2018-04-27 NOTE — Discharge Instructions (Signed)
Return if any problems.  See your Orthopaedist for recheck in 1 week

## 2018-04-27 NOTE — ED Triage Notes (Addendum)
Pt states she was getting ready for her day today when she tripped on the last step at her home, landing on her left ankle and foot. Bruising and swelling noted to foot and ankle.

## 2018-04-27 NOTE — Telephone Encounter (Signed)
Patient left message on voice mail requesting the status of her back surgery in regards to [facility] authorizing.

## 2018-05-06 ENCOUNTER — Ambulatory Visit (INDEPENDENT_AMBULATORY_CARE_PROVIDER_SITE_OTHER): Payer: Medicaid Other | Admitting: Surgery

## 2018-05-06 NOTE — Pre-Procedure Instructions (Signed)
[name]  05/06/2018      [id number] - [place], [address] AT [address] [place] [address] Phone: [phone] Fax: [phone]  [place], [place] - [address] [place] [address] Phone: [phone] Fax: [phone]    Your procedure is scheduled on Wednesday June 19.  Report to [facility] Admitting at 1:15 P.M.  Call this number if you have problems the morning of surgery:  [phone]   Remember:  Do not eat or drink after midnight.     Take these medicines the morning of surgery with A SIP OF WATER:   Acetaminophen (tylenol) if needed OR hydrocodone-acetaminophen (Norco) if needed Albuterol if needed Flonase if needed  7 days prior to surgery STOP taking any Aspirin(unless otherwise instructed by your surgeon), Aleve, Naproxen, Ibuprofen, Motrin, Advil, Goody's, BC's, all herbal medications, fish oil, and all vitamins     Do not wear jewelry, make-up or nail polish.  Do not wear lotions, powders, or perfumes, or deodorant.  Do not shave 48 hours prior to surgery.  Men may shave face and neck.  Do not bring valuables to the hospital.  [facility] is not responsible for any belongings or valuables.  Contacts, dentures or bridgework may not be worn into surgery.  Leave your suitcase in the car.  After surgery it may be brought to your room.  For patients admitted to the hospital, discharge time will be determined by your treatment team.  Patients discharged the day of surgery will not be allowed to drive home.   Special instructions:    [facility]- Preparing For Surgery  Before surgery, you can play an important role. Because skin is not sterile, your skin needs to be as free of germs as possible. You can reduce the number of germs on your skin by washing with CHG (chlorahexidine gluconate) Soap  before surgery.  CHG is an antiseptic cleaner which kills germs and bonds with the skin to continue killing germs even after washing.    Oral Hygiene is also important to reduce your risk of infection.  Remember - BRUSH YOUR TEETH THE MORNING OF SURGERY WITH YOUR REGULAR TOOTHPASTE  Please do not use if you have an allergy to CHG or antibacterial soaps. If your skin becomes reddened/irritated stop using the CHG.  Do not shave (including legs and underarms) for at least 48 hours prior to first CHG shower. It is OK to shave your face.  Please follow these instructions carefully.   1. Shower the NIGHT BEFORE SURGERY and the MORNING OF SURGERY with CHG.   2. If you chose to wash your hair, wash your hair first as usual with your normal shampoo.  3. After you shampoo, rinse your hair and body thoroughly to remove the shampoo.  4. Use CHG as you would any other liquid soap. You can apply CHG directly to the skin and wash gently with a scrungie or a clean washcloth.   5. Apply the CHG Soap to your body ONLY FROM THE NECK DOWN.  Do not use on open wounds or open sores. Avoid contact with your eyes, ears, mouth and genitals (private parts). Wash Face and genitals (private parts)  with your normal soap.  6. Wash thoroughly, paying special attention to the area where your surgery will be performed.  7. Thoroughly rinse your body with warm water  from the neck down.  8. DO NOT shower/wash with your normal soap after using and rinsing off the CHG Soap.  9. Pat yourself dry with a CLEAN TOWEL.  10. Wear CLEAN PAJAMAS to bed the night before surgery, wear comfortable clothes the morning of surgery  11. Place CLEAN SHEETS on your bed the night of your first shower and DO NOT SLEEP WITH PETS.    Day of Surgery:  Do not apply any deodorants/lotions.  Please wear clean clothes to the hospital/surgery center.   Remember to brush your teeth WITH YOUR REGULAR TOOTHPASTE.    Please read over the  following fact sheets that you were given. Coughing and Deep Breathing, MRSA Information and Surgical Site Infection Prevention

## 2018-05-07 ENCOUNTER — Encounter (HOSPITAL_COMMUNITY): Payer: Self-pay

## 2018-05-07 ENCOUNTER — Encounter (HOSPITAL_COMMUNITY)
Admission: RE | Admit: 2018-05-07 | Discharge: 2018-05-07 | Disposition: A | Discharge status: Home / Self Care | Payer: Medicaid Other | Source: Ambulatory Visit | Attending: Orthopaedic Surgery | Admitting: Orthopaedic Surgery

## 2018-05-07 ENCOUNTER — Other Ambulatory Visit: Payer: Self-pay

## 2018-05-07 ENCOUNTER — Ambulatory Visit (INDEPENDENT_AMBULATORY_CARE_PROVIDER_SITE_OTHER): Payer: Medicaid Other | Admitting: Surgery

## 2018-05-07 DIAGNOSIS — M5416 Radiculopathy, lumbar region: Secondary | ICD-10-CM

## 2018-05-07 DIAGNOSIS — Z01818 Encounter for other preprocedural examination: Secondary | ICD-10-CM | POA: Diagnosis not present

## 2018-05-07 DIAGNOSIS — R000 Tachycardia, unspecified: Secondary | ICD-10-CM | POA: Insufficient documentation

## 2018-05-07 DIAGNOSIS — Z01810 Encounter for preprocedural cardiovascular examination: Secondary | ICD-10-CM | POA: Insufficient documentation

## 2018-05-07 HISTORY — DX: Depression, unspecified: F32.A

## 2018-05-07 HISTORY — DX: Anxiety disorder, unspecified: F41.9

## 2018-05-07 HISTORY — DX: Major depressive disorder, single episode, unspecified: F32.9

## 2018-05-07 LAB — URINALYSIS, ROUTINE W REFLEX MICROSCOPIC
Bilirubin Urine: NEGATIVE
GLUCOSE, UA: NEGATIVE mg/dL
HGB URINE DIPSTICK: NEGATIVE
Ketones, ur: NEGATIVE mg/dL
NITRITE: POSITIVE — AB
PH: 5 (ref 5.0–8.0)
PROTEIN: NEGATIVE mg/dL
Specific Gravity, Urine: >1.046 — ABNORMAL HIGH (ref 1.005–1.030)

## 2018-05-07 LAB — COMPREHENSIVE METABOLIC PANEL
ALT: 18 U/L (ref 14–54)
ANION GAP: 10 (ref 5–15)
AST: 18 U/L (ref 15–41)
Albumin: 4.1 g/dL (ref 3.5–5.0)
Alkaline Phosphatase: 62 U/L (ref 38–126)
BUN: 10 mg/dL (ref 6–20)
CALCIUM: 9.5 mg/dL (ref 8.9–10.3)
CHLORIDE: 107 mmol/L (ref 101–111)
CO2: 21 mmol/L — AB (ref 22–32)
Creatinine, Ser: 0.74 mg/dL (ref 0.44–1.00)
Glucose, Bld: 94 mg/dL (ref 65–99)
Potassium: 3.8 mmol/L (ref 3.5–5.1)
SODIUM: 138 mmol/L (ref 135–145)
Total Bilirubin: 0.4 mg/dL (ref 0.3–1.2)
Total Protein: 7.7 g/dL (ref 6.5–8.1)

## 2018-05-07 LAB — CBC
HCT: 42.4 % (ref 36.0–46.0)
Hemoglobin: 14.1 g/dL (ref 12.0–15.0)
MCH: 37.1 pg — AB (ref 26.0–34.0)
MCHC: 33.3 g/dL (ref 30.0–36.0)
MCV: 111.6 fL — AB (ref 78.0–100.0)
PLATELETS: 358 10*3/uL (ref 150–400)
RBC: 3.8 MIL/uL — ABNORMAL LOW (ref 3.87–5.11)
RDW: 12.9 % (ref 11.5–15.5)
WBC: 10.9 10*3/uL — ABNORMAL HIGH (ref 4.0–10.5)

## 2018-05-07 LAB — SURGICAL PCR SCREEN
MRSA, PCR: NEGATIVE
Staphylococcus aureus: POSITIVE — AB

## 2018-05-07 LAB — RAPID URINE DRUG SCREEN, HOSP PERFORMED
AMPHETAMINES: NOT DETECTED
BENZODIAZEPINES: NOT DETECTED
Cocaine: NOT DETECTED
OPIATES: NOT DETECTED
TETRAHYDROCANNABINOL: NOT DETECTED

## 2018-05-07 NOTE — Progress Notes (Signed)
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  She continues have ongoing problems with low back pain and left lower extremity radiculopathy.  Today full H&P performed and will be placed in patient's chart.  I did review patient's records in epic and in June 2018 she had a positive drug screen for cocaine.  I will add a drug screen to her preop labs.  Patient denies any recent cocaine use.  Understands that if she does test positive that we will likely cancel her surgery.

## 2018-05-07 NOTE — Progress Notes (Signed)
PCP - Dr. Fredia Beets Cardiologist - patient denies  Chest x-ray - N/A  EKG - 05/07/2018 Stress Test - patient denies ECHO - patient denies Cardiac Cath - patient denies  Sleep Study - patient denies  Blood Thinner Instructions: N/A Aspirin Instructions: N/A  Anesthesia review: N/A  Patient denies shortness of breath, fever, cough and chest pain at PAT appointment   Patient verbalized understanding of instructions that were given to them at the PAT appointment. Patient was also instructed that they will need to review over the PAT instructions again at home before surgery.

## 2018-05-07 NOTE — H&P (Signed)
[name] is an 47 y.o. female.   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  Progressively worsening symptoms.  Failed conservative treatment.  Past Medical History:  Diagnosis Date  . Asthma   . COPD (chronic obstructive pulmonary disease) ([place])   . Irregular heart rate   . Renal disorder    kidney infections    Past Surgical History:  Procedure Laterality Date  . APPENDECTOMY    . TUBAL LIGATION    . TUBAL LIGATION  1998    Family History  Problem Relation Age of Onset  . Hypertension Mother   . Hypertension Father   . Heart disease Father    Social History:  reports that she has been smoking cigarettes.  She has a 45.00 pack-year smoking history. She has never used smokeless tobacco. She reports that she drinks alcohol. She reports that she does not use drugs.  Allergies:  Allergies  Allergen Reactions  . Bactrim [Sulfamethoxazole-Trimethoprim] Nausea Only    No medications prior to admission.    No results found for this or any previous visit (from the past 48 hour(s)). No results found.  Review of Systems  Constitutional: Negative.   HENT: Negative.   Respiratory: Negative.   Cardiovascular: Negative.   Gastrointestinal: Negative.   Genitourinary: Negative.   Musculoskeletal: Positive for back pain.  Neurological: Positive for tingling.  Psychiatric/Behavioral: Negative.     Last menstrual period 04/24/2018. Physical Exam  Constitutional: She is oriented to person, place, and time. She appears well-nourished. No distress.  HENT:  Head: Normocephalic and atraumatic.  Eyes: Pupils are equal, round, and reactive to light. EOM are normal.  Neck: Normal range of motion.  Cardiovascular: Normal rate.  Respiratory: Effort normal. No respiratory distress.  GI: Bowel sounds are normal. She exhibits no distension. There is no  tenderness.  Musculoskeletal: She exhibits tenderness.  Positive left straight leg raise  Neurological: She is alert and oriented to person, place, and time.  Skin: Skin is warm and dry.  Psychiatric: She has a normal mood and affect.     Assessment/Plan Left L4-5 HNP, low back pain and left lower extremity radiculopathy.   We will proceed with left L4-5 microdiscectomy as scheduled.  Surgical procedure along with potential rehab/recovery time discussed.  All questions answered and patient wishes to proceed.  I did review patient's records in epic and I did see that she had a positive drug screen for cocaine June 2018.  I will add rapid urine drug screen to preop labs that are been done today at the hospital.  Benjiman Core, PA-C 05/07/2018, 11:38 AM

## 2018-05-13 ENCOUNTER — Ambulatory Visit (HOSPITAL_COMMUNITY): Payer: Medicaid Other

## 2018-05-13 ENCOUNTER — Observation Stay (HOSPITAL_COMMUNITY)
Admission: RE | Admit: 2018-05-13 | Discharge: 2018-05-14 | Disposition: A | Discharge status: Home / Self Care | Payer: Medicaid Other | Source: Ambulatory Visit | Attending: Orthopaedic Surgery | Admitting: Orthopaedic Surgery

## 2018-05-13 ENCOUNTER — Encounter (HOSPITAL_COMMUNITY)
Admission: RE | Disposition: A | Discharge status: Home / Self Care | Payer: Self-pay | Source: Ambulatory Visit | Attending: Orthopaedic Surgery

## 2018-05-13 ENCOUNTER — Ambulatory Visit (HOSPITAL_COMMUNITY): Payer: Medicaid Other | Admitting: Anesthesiology

## 2018-05-13 ENCOUNTER — Encounter (HOSPITAL_COMMUNITY): Payer: Self-pay | Admitting: *Deleted

## 2018-05-13 DIAGNOSIS — J449 Chronic obstructive pulmonary disease, unspecified: Secondary | ICD-10-CM | POA: Diagnosis not present

## 2018-05-13 DIAGNOSIS — F17210 Nicotine dependence, cigarettes, uncomplicated: Secondary | ICD-10-CM | POA: Diagnosis not present

## 2018-05-13 DIAGNOSIS — M5116 Intervertebral disc disorders with radiculopathy, lumbar region: Secondary | ICD-10-CM | POA: Diagnosis not present

## 2018-05-13 DIAGNOSIS — Z79899 Other long term (current) drug therapy: Secondary | ICD-10-CM | POA: Diagnosis not present

## 2018-05-13 DIAGNOSIS — M5126 Other intervertebral disc displacement, lumbar region: Secondary | ICD-10-CM | POA: Diagnosis present

## 2018-05-13 DIAGNOSIS — M545 Low back pain: Secondary | ICD-10-CM | POA: Diagnosis present

## 2018-05-13 DIAGNOSIS — Z419 Encounter for procedure for purposes other than remedying health state, unspecified: Secondary | ICD-10-CM

## 2018-05-13 HISTORY — PX: LUMBAR LAMINECTOMY: SHX95

## 2018-05-13 HISTORY — DX: Sprain of unspecified ligament of unspecified ankle, initial encounter: S93.409A

## 2018-05-13 LAB — POCT PREGNANCY, URINE: PREG TEST UR: NEGATIVE

## 2018-05-13 SURGERY — MICRODISCECTOMY LUMBAR LAMINECTOMY
Anesthesia: General | Site: Spine Lumbar

## 2018-05-13 MED ORDER — ROCURONIUM BROMIDE 50 MG/5ML IV SOLN
INTRAVENOUS | Status: AC
  Filled 2018-05-13: qty 2

## 2018-05-13 MED ORDER — MIDAZOLAM HCL 5 MG/5ML IJ SOLN
INTRAMUSCULAR | Status: DC | PRN
  Administered 2018-05-13: 2 mg via INTRAVENOUS

## 2018-05-13 MED ORDER — ONDANSETRON HCL 4 MG/2ML IJ SOLN: 4.0000 mg | Freq: Four times a day (QID) | INTRAMUSCULAR | Status: DC | PRN

## 2018-05-13 MED ORDER — METHOCARBAMOL 1000 MG/10ML IJ SOLN
500.0000 mg | Freq: Four times a day (QID) | INTRAVENOUS | Status: DC | PRN
  Filled 2018-05-13: qty 5

## 2018-05-13 MED ORDER — BUPIVACAINE HCL (PF) 0.25 % IJ SOLN
INTRAMUSCULAR | Status: AC
  Filled 2018-05-13: qty 30

## 2018-05-13 MED ORDER — CEFAZOLIN SODIUM-DEXTROSE 2-4 GM/100ML-% IV SOLN
2.0000 g | INTRAVENOUS | Status: AC
  Administered 2018-05-13: 2 g via INTRAVENOUS

## 2018-05-13 MED ORDER — ALBUTEROL SULFATE HFA 108 (90 BASE) MCG/ACT IN AERS: 2.0000 | INHALATION_SPRAY | Freq: Four times a day (QID) | RESPIRATORY_TRACT | Status: DC | PRN

## 2018-05-13 MED ORDER — ACETAMINOPHEN 650 MG RE SUPP: 650.0000 mg | RECTAL | Status: DC | PRN

## 2018-05-13 MED ORDER — PROPOFOL 10 MG/ML IV BOLUS
INTRAVENOUS | Status: DC | PRN
  Administered 2018-05-13: 150 mg via INTRAVENOUS

## 2018-05-13 MED ORDER — SUGAMMADEX SODIUM 200 MG/2ML IV SOLN
INTRAVENOUS | Status: DC | PRN
  Administered 2018-05-13: 200 mg via INTRAVENOUS

## 2018-05-13 MED ORDER — SODIUM CHLORIDE 0.9 % IV SOLN
INTRAVENOUS | Status: DC
  Administered 2018-05-14: 05:00:00 via INTRAVENOUS

## 2018-05-13 MED ORDER — FENTANYL CITRATE (PF) 250 MCG/5ML IJ SOLN
INTRAMUSCULAR | Status: AC
  Filled 2018-05-13: qty 5

## 2018-05-13 MED ORDER — ROCURONIUM BROMIDE 100 MG/10ML IV SOLN
INTRAVENOUS | Status: DC | PRN
  Administered 2018-05-13: 50 mg via INTRAVENOUS

## 2018-05-13 MED ORDER — 0.9 % SODIUM CHLORIDE (POUR BTL) OPTIME
TOPICAL | Status: DC | PRN
  Administered 2018-05-13: 1000 mL

## 2018-05-13 MED ORDER — HYDROMORPHONE HCL 1 MG/ML IJ SOLN
0.5000 mg | INTRAMUSCULAR | Status: DC | PRN
  Administered 2018-05-13: 0.5 mg via INTRAVENOUS
  Filled 2018-05-13: qty 0.5

## 2018-05-13 MED ORDER — FLUTICASONE PROPIONATE 50 MCG/ACT NA SUSP: 1.0000 | Freq: Every day | NASAL | Status: DC | PRN

## 2018-05-13 MED ORDER — MEPERIDINE HCL 50 MG/ML IJ SOLN: 6.2500 mg | INTRAMUSCULAR | Status: DC | PRN

## 2018-05-13 MED ORDER — OXYCODONE HCL 5 MG/5ML PO SOLN: 5.0000 mg | Freq: Once | ORAL | Status: DC | PRN

## 2018-05-13 MED ORDER — PROMETHAZINE HCL 25 MG/ML IJ SOLN: 6.2500 mg | INTRAMUSCULAR | Status: DC | PRN

## 2018-05-13 MED ORDER — MIDAZOLAM HCL 2 MG/2ML IJ SOLN
INTRAMUSCULAR | Status: AC
  Filled 2018-05-13: qty 2

## 2018-05-13 MED ORDER — ONDANSETRON HCL 4 MG/2ML IJ SOLN
INTRAMUSCULAR | Status: DC | PRN
  Administered 2018-05-13: 4 mg via INTRAVENOUS

## 2018-05-13 MED ORDER — HYDROMORPHONE HCL 1 MG/ML IJ SOLN
INTRAMUSCULAR | Status: AC
  Filled 2018-05-13: qty 1

## 2018-05-13 MED ORDER — ONDANSETRON HCL 4 MG PO TABS: 4.0000 mg | ORAL_TABLET | Freq: Four times a day (QID) | ORAL | Status: DC | PRN

## 2018-05-13 MED ORDER — SODIUM CHLORIDE 0.9% FLUSH
3.0000 mL | Freq: Two times a day (BID) | INTRAVENOUS | Status: DC
  Administered 2018-05-13: 3 mL via INTRAVENOUS

## 2018-05-13 MED ORDER — DEXAMETHASONE SODIUM PHOSPHATE 10 MG/ML IJ SOLN
INTRAMUSCULAR | Status: AC
  Filled 2018-05-13: qty 2

## 2018-05-13 MED ORDER — PROPOFOL 10 MG/ML IV BOLUS
INTRAVENOUS | Status: AC
  Filled 2018-05-13: qty 20

## 2018-05-13 MED ORDER — ONDANSETRON HCL 4 MG/2ML IJ SOLN
INTRAMUSCULAR | Status: AC
  Filled 2018-05-13: qty 4

## 2018-05-13 MED ORDER — OXYCODONE HCL 5 MG PO TABS: 5.0000 mg | ORAL_TABLET | Freq: Once | ORAL | Status: DC | PRN

## 2018-05-13 MED ORDER — LIDOCAINE HCL (CARDIAC) PF 100 MG/5ML IV SOSY
PREFILLED_SYRINGE | INTRAVENOUS | Status: DC | PRN
  Administered 2018-05-13: 100 mg via INTRAVENOUS

## 2018-05-13 MED ORDER — METHOCARBAMOL 500 MG PO TABS
ORAL_TABLET | ORAL | Status: AC
  Filled 2018-05-13: qty 1

## 2018-05-13 MED ORDER — CEFAZOLIN SODIUM-DEXTROSE 1-4 GM/50ML-% IV SOLN
1.0000 g | Freq: Three times a day (TID) | INTRAVENOUS | Status: AC
  Administered 2018-05-13 – 2018-05-14 (×2): 1 g via INTRAVENOUS
  Filled 2018-05-13 (×2): qty 50

## 2018-05-13 MED ORDER — DOCUSATE SODIUM 100 MG PO CAPS
100.0000 mg | ORAL_CAPSULE | Freq: Two times a day (BID) | ORAL | Status: DC
  Filled 2018-05-13: qty 1

## 2018-05-13 MED ORDER — LIDOCAINE 2% (20 MG/ML) 5 ML SYRINGE
INTRAMUSCULAR | Status: AC
  Filled 2018-05-13: qty 10

## 2018-05-13 MED ORDER — SUGAMMADEX SODIUM 500 MG/5ML IV SOLN
INTRAVENOUS | Status: AC
  Filled 2018-05-13: qty 5

## 2018-05-13 MED ORDER — METHOCARBAMOL 500 MG PO TABS
500.0000 mg | ORAL_TABLET | Freq: Four times a day (QID) | ORAL | Status: DC | PRN
Start: 2018-05-13 — End: 2018-05-14
  Administered 2018-05-13 – 2018-05-14 (×2): 500 mg via ORAL
  Filled 2018-05-13 (×2): qty 1

## 2018-05-13 MED ORDER — LACTATED RINGERS IV SOLN
INTRAVENOUS | Status: DC | PRN
  Administered 2018-05-13 (×2): via INTRAVENOUS

## 2018-05-13 MED ORDER — PHENOL 1.4 % MT LIQD: 1.0000 | OROMUCOSAL | Status: DC | PRN

## 2018-05-13 MED ORDER — CEFAZOLIN SODIUM-DEXTROSE 2-4 GM/100ML-% IV SOLN
INTRAVENOUS | Status: AC
  Filled 2018-05-13: qty 100

## 2018-05-13 MED ORDER — OXYCODONE-ACETAMINOPHEN 5-325 MG PO TABS: 1.0000 | ORAL_TABLET | Freq: Four times a day (QID) | ORAL | 0 refills | Status: DC | PRN

## 2018-05-13 MED ORDER — DEXAMETHASONE SODIUM PHOSPHATE 4 MG/ML IJ SOLN
INTRAMUSCULAR | Status: DC | PRN
  Administered 2018-05-13: 10 mg via INTRAVENOUS

## 2018-05-13 MED ORDER — ALBUTEROL SULFATE (2.5 MG/3ML) 0.083% IN NEBU
2.5000 mg | INHALATION_SOLUTION | Freq: Four times a day (QID) | RESPIRATORY_TRACT | Status: DC | PRN
Start: 2018-05-13 — End: 2018-05-14

## 2018-05-13 MED ORDER — METHOCARBAMOL 500 MG PO TABS: 500.0000 mg | ORAL_TABLET | Freq: Four times a day (QID) | ORAL | 0 refills | Status: DC | PRN

## 2018-05-13 MED ORDER — FENTANYL CITRATE (PF) 100 MCG/2ML IJ SOLN
INTRAMUSCULAR | Status: DC | PRN
  Administered 2018-05-13: 100 ug via INTRAVENOUS
  Administered 2018-05-13 (×3): 50 ug via INTRAVENOUS

## 2018-05-13 MED ORDER — MENTHOL 3 MG MT LOZG: 1.0000 | LOZENGE | OROMUCOSAL | Status: DC | PRN

## 2018-05-13 MED ORDER — POLYETHYLENE GLYCOL 3350 17 G PO PACK: 17.0000 g | PACK | Freq: Every day | ORAL | Status: DC | PRN

## 2018-05-13 MED ORDER — ACETAMINOPHEN 325 MG PO TABS
650.0000 mg | ORAL_TABLET | ORAL | Status: DC | PRN
  Administered 2018-05-13 – 2018-05-14 (×2): 650 mg via ORAL
  Filled 2018-05-13 (×2): qty 2

## 2018-05-13 MED ORDER — HYDROMORPHONE HCL 1 MG/ML IJ SOLN
0.2500 mg | INTRAMUSCULAR | Status: DC | PRN
  Administered 2018-05-13 (×4): 0.5 mg via INTRAVENOUS

## 2018-05-13 MED ORDER — SODIUM CHLORIDE 0.9% FLUSH: 3.0000 mL | INTRAVENOUS | Status: DC | PRN

## 2018-05-13 MED ORDER — CHLORHEXIDINE GLUCONATE 4 % EX LIQD: 60.0000 mL | Freq: Once | CUTANEOUS | Status: DC

## 2018-05-13 MED ORDER — OXYCODONE HCL 5 MG PO TABS
5.0000 mg | ORAL_TABLET | Freq: Four times a day (QID) | ORAL | Status: DC | PRN
  Administered 2018-05-13: 10 mg via ORAL
  Filled 2018-05-13 (×2): qty 2

## 2018-05-13 MED ORDER — EPHEDRINE SULFATE 50 MG/ML IJ SOLN
INTRAMUSCULAR | Status: AC
  Filled 2018-05-13: qty 1

## 2018-05-13 MED ORDER — OXYCODONE HCL 5 MG PO TABS
5.0000 mg | ORAL_TABLET | ORAL | Status: DC | PRN
  Administered 2018-05-13 – 2018-05-14 (×2): 10 mg via ORAL
  Administered 2018-05-14: 5 mg via ORAL
  Filled 2018-05-13 (×2): qty 2

## 2018-05-13 MED ORDER — MIDAZOLAM HCL 2 MG/2ML IJ SOLN
0.5000 mg | INTRAMUSCULAR | Status: DC | PRN
  Administered 2018-05-13: 0.5 mg via INTRAVENOUS

## 2018-05-13 SURGICAL SUPPLY — 43 items
ADH SKN CLS APL DERMABOND .7 (GAUZE/BANDAGES/DRESSINGS) ×1
APL SKNCLS STERI-STRIP NONHPOA (GAUZE/BANDAGES/DRESSINGS) ×1
BENZOIN TINCTURE PRP APPL 2/3 (GAUZE/BANDAGES/DRESSINGS) ×1 IMPLANT
BUR ROUND FLUTED 4 SOFT TCH (BURR) IMPLANT
CANISTER SUCT 3000ML PPV (MISCELLANEOUS) ×2 IMPLANT
COVER SURGICAL LIGHT HANDLE (MISCELLANEOUS) ×2 IMPLANT
DERMABOND ADVANCED (GAUZE/BANDAGES/DRESSINGS) ×1
DERMABOND ADVANCED .7 DNX12 (GAUZE/BANDAGES/DRESSINGS) ×1 IMPLANT
DRAPE MICROSCOPE LEICA (MISCELLANEOUS) ×2 IMPLANT
DRSG MEPILEX BORDER 4X4 (GAUZE/BANDAGES/DRESSINGS) ×1 IMPLANT
DRSG MEPILEX BORDER 4X8 (GAUZE/BANDAGES/DRESSINGS) IMPLANT
DURAPREP 26ML APPLICATOR (WOUND CARE) ×2 IMPLANT
DURASEAL SPINE SEALANT 3ML (MISCELLANEOUS) IMPLANT
ELECT REM PT RETURN 9FT ADLT (ELECTROSURGICAL) ×2
ELECTRODE REM PT RTRN 9FT ADLT (ELECTROSURGICAL) ×1 IMPLANT
GAUZE SPONGE 4X4 12PLY STRL (GAUZE/BANDAGES/DRESSINGS) ×2 IMPLANT
GLOVE BIOGEL PI IND STRL 8 (GLOVE) ×2 IMPLANT
GLOVE BIOGEL PI INDICATOR 8 (GLOVE) ×2
GLOVE ORTHO TXT STRL SZ7.5 (GLOVE) ×4 IMPLANT
GOWN STRL REUS W/ TWL LRG LVL3 (GOWN DISPOSABLE) ×1 IMPLANT
GOWN STRL REUS W/ TWL XL LVL3 (GOWN DISPOSABLE) ×1 IMPLANT
GOWN STRL REUS W/TWL 2XL LVL3 (GOWN DISPOSABLE) ×2 IMPLANT
GOWN STRL REUS W/TWL LRG LVL3 (GOWN DISPOSABLE) ×2
GOWN STRL REUS W/TWL XL LVL3 (GOWN DISPOSABLE) ×2
KIT BASIN OR (CUSTOM PROCEDURE TRAY) ×2 IMPLANT
KIT TURNOVER KIT B (KITS) ×2 IMPLANT
NDL SPNL 18GX3.5 QUINCKE PK (NEEDLE) ×1 IMPLANT
NEEDLE SPNL 18GX3.5 QUINCKE PK (NEEDLE) ×2 IMPLANT
NS IRRIG 1000ML POUR BTL (IV SOLUTION) ×2 IMPLANT
PACK LAMINECTOMY ORTHO (CUSTOM PROCEDURE TRAY) ×2 IMPLANT
PAD ARMBOARD 7.5X6 YLW CONV (MISCELLANEOUS) ×4 IMPLANT
PATTIES SURGICAL .5 X.5 (GAUZE/BANDAGES/DRESSINGS) IMPLANT
PATTIES SURGICAL .75X.75 (GAUZE/BANDAGES/DRESSINGS) IMPLANT
STRIP CLOSURE SKIN 1/2X4 (GAUZE/BANDAGES/DRESSINGS) ×1 IMPLANT
SUT VIC AB 1 CTX 36 (SUTURE) ×2
SUT VIC AB 1 CTX36XBRD ANBCTR (SUTURE) ×1 IMPLANT
SUT VIC AB 2-0 CT1 27 (SUTURE) ×2
SUT VIC AB 2-0 CT1 TAPERPNT 27 (SUTURE) ×1 IMPLANT
SUT VIC AB 3-0 X1 27 (SUTURE) IMPLANT
SYR 20ML ECCENTRIC (SYRINGE) IMPLANT
TOWEL OR 17X24 6PK STRL BLUE (TOWEL DISPOSABLE) ×2 IMPLANT
TOWEL OR 17X26 10 PK STRL BLUE (TOWEL DISPOSABLE) ×2 IMPLANT
WATER STERILE IRR 1000ML POUR (IV SOLUTION) ×2 IMPLANT

## 2018-05-13 NOTE — Anesthesia Procedure Notes (Signed)
Procedure Name: Intubation Date/Time: 05/13/2018 2:15 PM Performed by: Jaxan Michel T, CRNA Pre-anesthesia Checklist: Patient identified, Emergency Drugs available, Suction available and Patient being monitored Patient Re-evaluated:Patient Re-evaluated prior to induction Oxygen Delivery Method: Circle system utilized Preoxygenation: Pre-oxygenation with 100% oxygen Induction Type: IV induction Ventilation: Mask ventilation without difficulty and Oral airway inserted - appropriate to patient size Laryngoscope Size: Miller and 2 Grade View: Grade I Tube type: Oral Tube size: 7.5 mm Number of attempts: 1 Airway Equipment and Method: Patient positioned with wedge pillow and Stylet Placement Confirmation: ETT inserted through vocal cords under direct vision,  positive ETCO2 and breath sounds checked- equal and bilateral Secured at: 22 cm Tube secured with: Tape Dental Injury: Teeth and Oropharynx as per pre-operative assessment

## 2018-05-13 NOTE — Interval H&P Note (Signed)
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 After consideration of risks, benefits and other options for treatment, the patient has consented to  Procedure(s): LEFT L4-5 MICRODISCECTOMY (N/A) as a surgical intervention .  The patient's history has been reviewed, patient examined, no change in status, stable for surgery.  I have reviewed the patient's chart and labs.  Questions were answered to the patient's satisfaction.     Marybelle Killings

## 2018-05-13 NOTE — Transfer of Care (Signed)
Immediate Anesthesia Transfer of Care Note  Patient: [name]  Procedure(s) Performed: LEFT LUMBAR FOUR-FIVE MICRODISCECTOMY (N/A Spine Lumbar)  Patient Location: PACU  Anesthesia Type:General  Level of Consciousness: awake, alert  and oriented  Airway & Oxygen Therapy: Patient Spontanous Breathing and Patient connected to nasal cannula oxygen  Post-op Assessment: Report given to RN, Post -op Vital signs reviewed and stable and Patient moving all extremities  Post vital signs: Reviewed and stable  Last Vitals:  Vitals Value Taken Time  BP    Temp 36.4 C 05/13/2018  3:57 PM  Pulse    Resp    SpO2      Last Pain:  Vitals:   05/13/18 1229  TempSrc: Oral         Complications: No apparent anesthesia complications

## 2018-05-13 NOTE — Anesthesia Preprocedure Evaluation (Addendum)
Anesthesia Evaluation  Patient identified by MRN, date of birth, ID band Patient awake    Reviewed: Allergy & Precautions, NPO status , Patient's Chart, lab work & pertinent test results  Airway Mallampati: II  TM Distance: >3 FB Neck ROM: Full    Dental no notable dental hx.    Pulmonary neg pulmonary ROS, COPD, Current Smoker,    Pulmonary exam normal breath sounds clear to auscultation       Cardiovascular negative cardio ROS Normal cardiovascular exam Rhythm:Regular Rate:Normal     Neuro/Psych Anxiety Depression negative neurological ROS  negative psych ROS   GI/Hepatic negative GI ROS, Neg liver ROS,   Endo/Other  negative endocrine ROS  Renal/GU negative Renal ROS  negative genitourinary   Musculoskeletal negative musculoskeletal ROS (+) Arthritis ,   Abdominal   Peds negative pediatric ROS (+)  Hematology negative hematology ROS (+)   Anesthesia Other Findings   Reproductive/Obstetrics negative OB ROS                             Anesthesia Physical Anesthesia Plan  ASA: II  Anesthesia Plan: General   Post-op Pain Management:    Induction: Intravenous  PONV Risk Score and Plan: 2 and Ondansetron and Midazolam  Airway Management Planned: Oral ETT  Additional Equipment:   Intra-op Plan:   Post-operative Plan: Extubation in OR  Informed Consent: I have reviewed the patients History and Physical, chart, labs and discussed the procedure including the risks, benefits and alternatives for the proposed anesthesia with the patient or authorized representative who has indicated his/her understanding and acceptance.   Dental advisory given  Plan Discussed with: CRNA  Anesthesia Plan Comments:         Anesthesia Quick Evaluation

## 2018-05-13 NOTE — Op Note (Signed)
Preop diagnosis: Left L4-5 herniated nucleus pulposus with persistent radiculopathy.  Postop diagnosis: Same  Procedure: Left L4-5 microdiscectomy for HNP  Surgeon: Rodell Perna, MD  Assistant: Benjiman Core, PA-C medically necessary and present for the entire procedure  Anesthesia: General plus Marcaine skin local  Brief history: 47 year old female with persistent left L4-5 HNP with radiculopathy failed conservative treatment including prednisone Dosepak, muscle relaxants, Neurontin, home exercise program, Tylenol.  MRI scan shows prominent disc protrusion left paracentral with nerve root compression.  Due to her persistent symptoms she has not been able to work.  Procedure: After induction general anesthesia orotracheal intubation patient was placed prone on chest rolls careful padding positioning shoulders at 9090 yellow pads calf pumpers DuraPrep with 1010 drape applied at S1.  Area squared with towel sterile skin marker Betadine Steri-Drape and laminectomy sheet and drapes.  Ancef was given prophylactically and timeout procedure was completed.  Needle localization with spinal needle crosstable lateral confirmed this is directly over the L4-5 interspace.  Incision was made to 3 mm to the left the midline subperiosteal dissection and Penfield 4 was placed at the inferior aspect of the L4 lamina confirmed with a second x-ray directly over the disc space and this was marked with the purple skin marker directly on the bone.  Laminotomy was performed after placement of the [facility] retractor.  Partial facetectomy was performed which exposed the nerve root which was tented over large disc protrusion consistent with her MRI findings.  Annulus was incised and chunks of disc were removed.  Epstein curettes were used at the midline micropituitary up down-biting micropituitary and regular pituitaries.  Disc fragments were teased using a hockey-stick from underneath the annulus from the lateral position at the  foramina and then grasped with micropituitary and removed decompressing the nerve root and early portion of the foramina.  Overhanging facet was trimmed back with the Kerrison rondure using the operative microscope.  Nerve root was free as it exited underneath the pedicle.  No compression at the midline was noted.  Dura was intact.  Epidural veins were coagulated with bipolar cautery irrigation with saline solution.  Hockey-stick was placed anterior to the dura and nerve root and was free without compression and a 180 degrees sweep.  Operative field was dry repeat irrigation and then standard closure with #1 Vicryl the deep fascia 2-0 Vicryl subcutaneous reapproximation.  Subcuticular skin closure Dermabond in the skin postop dressing and transferred to the recovery room.

## 2018-05-14 ENCOUNTER — Encounter (HOSPITAL_COMMUNITY): Payer: Self-pay | Admitting: Orthopaedic Surgery

## 2018-05-14 DIAGNOSIS — M5116 Intervertebral disc disorders with radiculopathy, lumbar region: Secondary | ICD-10-CM | POA: Diagnosis not present

## 2018-05-14 NOTE — Discharge Instructions (Signed)
Walk daily. Avoid lifting , bending. See dr. Lorin Mercy in one week.

## 2018-05-14 NOTE — Anesthesia Postprocedure Evaluation (Signed)
Anesthesia Post Note  Patient: [name]  Procedure(s) Performed: LEFT LUMBAR FOUR-FIVE MICRODISCECTOMY (N/A Spine Lumbar)     Patient location during evaluation: PACU Anesthesia Type: General Level of consciousness: awake and alert Pain management: pain level controlled Vital Signs Assessment: post-procedure vital signs reviewed and stable Respiratory status: spontaneous breathing, nonlabored ventilation, respiratory function stable and patient connected to nasal cannula oxygen Cardiovascular status: blood pressure returned to baseline and stable Postop Assessment: no apparent nausea or vomiting Anesthetic complications: no    Last Vitals:  Vitals:   05/13/18 2313 05/14/18 0400  BP: 123/79 111/72  Pulse: (!) 108 99  Resp: 20 20  Temp: 36.9 C 36.7 C  SpO2: 93% 95%    Last Pain:  Vitals:   05/14/18 0830  TempSrc:   PainSc: 5                  Rayen Dafoe

## 2018-05-14 NOTE — Progress Notes (Signed)
Pt doing well. Pt and family given D/C instructions with Rx's, verbal understanding was provided. Pt's incision was changed per MD order. Pt's IV was removed prior to D/C. Pt D/C'd home @ 0900 per MD order. Pt is stable @ D/C and has no other needs at this time. Holli Humbles, RN

## 2018-05-14 NOTE — Progress Notes (Signed)
   Subjective: 1 Day Post-Op Procedure(s) (LRB): LEFT LUMBAR FOUR-FIVE MICRODISCECTOMY (N/A) Patient reports pain as mild. Leg pain gone.    Objective: Vital signs in last 24 hours: Temp:  [97.5 F (36.4 C)-98.4 F (36.9 C)] 98.1 F (36.7 C) (06/20 0400) Pulse Rate:  [99-112] 99 (06/20 0400) Resp:  [12-20] 20 (06/20 0400) BP: (111-145)/(71-91) 111/72 (06/20 0400) SpO2:  [92 %-98 %] 95 % (06/20 0400) Weight:  [192 lb (87.1 kg)] 192 lb (87.1 kg) (06/19 1229)  Intake/Output from previous day: 06/19 0701 - 06/20 0700 In: 1910 [P.O.:360; I.V.:1450; IV Piggyback:100] Out: 100 [Blood:100] Intake/Output this shift: No intake/output data recorded.  No results for input(s): HGB in the last 72 hours. No results for input(s): WBC, RBC, HCT, PLT in the last 72 hours. No results for input(s): NA, K, CL, CO2, BUN, CREATININE, GLUCOSE, CALCIUM in the last 72 hours. No results for input(s): LABPT, INR in the last 72 hours.  Neurologically intact Dg Lumbar Spine 2-3 Views  Result Date: 05/13/2018 CLINICAL DATA:  Lumbar disc protrusion at L4-5. EXAM: LUMBAR SPINE - 2-3 VIEW COMPARISON:  MRI dated 03/13/2018 FINDINGS: Lateral image 1 demonstrates a needle at the L4-5 level. Lateral image 2 demonstrates instruments at L4-5. IMPRESSION: Instruments at L4-5. Electronically Signed   By: Lorriane Shire M.D.   On: 05/13/2018 15:36    Assessment/Plan: 1 Day Post-Op Procedure(s) (LRB): LEFT LUMBAR FOUR-FIVE MICRODISCECTOMY (N/A) Plan:  Discharge home.   Marybelle Killings 05/14/2018, 8:20 AM

## 2018-05-20 ENCOUNTER — Ambulatory Visit (INDEPENDENT_AMBULATORY_CARE_PROVIDER_SITE_OTHER): Payer: Medicaid Other | Admitting: Orthopaedic Surgery

## 2018-05-20 ENCOUNTER — Encounter (INDEPENDENT_AMBULATORY_CARE_PROVIDER_SITE_OTHER): Payer: Self-pay | Admitting: Orthopaedic Surgery

## 2018-05-20 VITALS — BP 138/104 | HR 106 | Ht 68.0 in | Wt 192.0 lb

## 2018-05-20 DIAGNOSIS — Z9889 Other specified postprocedural states: Secondary | ICD-10-CM

## 2018-05-20 NOTE — Progress Notes (Signed)
   Post-Op Visit Note   Patient: [name]           Date of Birth: 12-18-1970           MRN: [id number] Visit Date: 05/20/2018 PCP: Alfonse Spruce, FNP   Assessment & Plan: Continue walking recheck 5 weeks to discuss work resumption.  Chief Complaint:  Chief Complaint  Patient presents with  . Lower Back - Routine Post Op   Visit Diagnoses: Post left L4-5 microdiscectomy.  Plan: Patient return in 5 weeks incision looks good she got good relief of her pain.  She is walking daily.  Her blood pressure was elevated some today at 138/104 in the past is been normal.  It may be related to the medication and she is weaning off her pain medicine.  She can use some plain Tylenol if needed.  She will continue to work on daily walking 2 miles a day and I will check her back in 5 weeks to discuss work resumption.  She works in Ambulance person.  Follow-Up Instructions: No follow-ups on file.   Orders:  No orders of the defined types were placed in this encounter.  No orders of the defined types were placed in this encounter.   Imaging: No results found.  PMFS History: Patient Active Problem List   Diagnosis Date Noted  . HNP (herniated nucleus pulposus), lumbar 05/13/2018  . Left lumbar radiculitis 04/25/2017  . Lumbar degenerative disc disease 04/25/2017   Past Medical History:  Diagnosis Date  . Ankle sprain    left  . Anxiety    treated for panic attacks in the past.  . Asthma   . COPD (chronic obstructive pulmonary disease) ([place])   . Depression   . Irregular heart rate   . Renal disorder    kidney infections    Family History  Problem Relation Age of Onset  . Hypertension Mother   . Hypertension Father   . Heart disease Father     Past Surgical History:  Procedure Laterality Date  . APPENDECTOMY    . LUMBAR LAMINECTOMY N/A 05/13/2018   Procedure: LEFT LUMBAR FOUR-FIVE MICRODISCECTOMY;  Surgeon: Marybelle Killings, MD;  Location: [place];  Service: Orthopedics;   Laterality: N/A;  . TUBAL LIGATION    . TUBAL LIGATION  1998   Social History   Occupational History  . Not on file  Tobacco Use  . Smoking status: Current Every Day Smoker    Packs/day: 1.50    Years: 30.00    Pack years: 45.00    Types: Cigarettes  . Smokeless tobacco: Never Used  Substance and Sexual Activity  . Alcohol use: Yes    Comment: rare  . Drug use: No  . Sexual activity: Not on file

## 2018-05-21 NOTE — Discharge Summary (Signed)
Patient ID: [name] MRN: [id number] DOB/AGE: [birth year] 47 y.o.  Admit date: 05/13/2018 Discharge date: 05/21/2018  Admission Diagnoses:  Active Problems:   HNP (herniated nucleus pulposus), lumbar   Discharge Diagnoses:  Active Problems:   HNP (herniated nucleus pulposus), lumbar  status post Procedure(s): LEFT LUMBAR FOUR-FIVE MICRODISCECTOMY  Past Medical History:  Diagnosis Date  . Ankle sprain    left  . Anxiety    treated for panic attacks in the past.  . Asthma   . COPD (chronic obstructive pulmonary disease) ([place])   . Depression   . Irregular heart rate   . Renal disorder    kidney infections    Surgeries: Procedure(s): LEFT LUMBAR FOUR-FIVE MICRODISCECTOMY on 05/13/2018   Consultants:   Discharged Condition: Improved  Hospital Course: [name] is an 47 y.o. female who was admitted 05/13/2018 for operative treatment of lumbar HNP. Patient failed conservative treatments (please see the history and physical for the specifics) and had severe unremitting pain that affects sleep, daily activities and work/hobbies. After pre-op clearance, the patient was taken to the operating room on 05/13/2018 and underwent  Procedure(s): LEFT LUMBAR FOUR-FIVE MICRODISCECTOMY.    Patient was given perioperative antibiotics:  Anti-infectives (From admission, onward)   Start     Dose/Rate Route Frequency Ordered Stop   05/13/18 2200  ceFAZolin (ANCEF) IVPB 1 g/50 mL premix     1 g 100 mL/hr over 30 Minutes Intravenous Every 8 hours 05/13/18 1737 05/14/18 0534   05/13/18 1330  ceFAZolin (ANCEF) IVPB 2g/100 mL premix     2 g 200 mL/hr over 30 Minutes Intravenous On call to O.R. 05/13/18 1208 05/13/18 1409   05/13/18 1210  ceFAZolin (ANCEF) 2-4 GM/100ML-% IVPB    Note to Pharmacy:  Debbe Bales, Meredit: cabinet override      05/13/18 1210 05/13/18 1409       Patient was given sequential compression devices and early ambulation to prevent DVT.   Patient benefited  maximally from hospital stay and there were no complications. At the time of discharge, the patient was urinating/moving their bowels without difficulty, tolerating a regular diet, pain is controlled with oral pain medications and they have been cleared by PT/OT.   Recent vital signs: No data found.   Recent laboratory studies: No results for input(s): WBC, HGB, HCT, PLT, NA, K, CL, CO2, BUN, CREATININE, GLUCOSE, INR, CALCIUM in the last 72 hours.  Invalid input(s): PT, 2   Discharge Medications:   Allergies as of 05/14/2018      Reactions   Bactrim [sulfamethoxazole-trimethoprim] Nausea Only      Medication List    STOP taking these medications   acetaminophen 500 MG tablet Commonly known as:  TYLENOL   BIOFREEZE EX   diphenhydramine-acetaminophen 25-500 MG Tabs tablet Commonly known as:  TYLENOL PM   HYDROcodone-acetaminophen 5-325 MG tablet Commonly known as:  NORCO/VICODIN     TAKE these medications   albuterol 108 (90 Base) MCG/ACT inhaler Commonly known as:  PROVENTIL HFA;VENTOLIN HFA Inhale 2 puffs into the lungs every 6 (six) hours as needed for wheezing or shortness of breath.   fluticasone 50 MCG/ACT nasal spray Commonly known as:  FLONASE Place 1 spray into both nostrils daily as needed for allergies or rhinitis.   methocarbamol 500 MG tablet Commonly known as:  ROBAXIN Take 1 tablet (500 mg total) by mouth every 6 (six) hours as needed for muscle spasms.   oxyCODONE-acetaminophen 5-325 MG tablet Commonly known as:  PERCOCET/ROXICET Take  1-2 tablets by mouth every 6 (six) hours as needed for severe pain.       Diagnostic Studies: Dg Lumbar Spine 2-3 Views  Result Date: 05/13/2018 CLINICAL DATA:  Lumbar disc protrusion at L4-5. EXAM: LUMBAR SPINE - 2-3 VIEW COMPARISON:  MRI dated 03/13/2018 FINDINGS: Lateral image 1 demonstrates a needle at the L4-5 level. Lateral image 2 demonstrates instruments at L4-5. IMPRESSION: Instruments at L4-5. Electronically  Signed   By: Lorriane Shire M.D.   On: 05/13/2018 15:36   Dg Ankle Complete Left  Result Date: 04/27/2018 CLINICAL DATA:  Left ankle pain after fall. EXAM: LEFT ANKLE COMPLETE - 3+ VIEW COMPARISON:  None. FINDINGS: There is no evidence of fracture, dislocation, or joint effusion. There is no evidence of arthropathy or other focal bone abnormality. Soft tissues are unremarkable. IMPRESSION: Normal left ankle. Electronically Signed   By: Marijo Conception, M.D.   On: 04/27/2018 08:24   Dg Foot Complete Left  Result Date: 04/27/2018 CLINICAL DATA:  Left foot pain after fall. EXAM: LEFT FOOT - COMPLETE 3+ VIEW COMPARISON:  Radiographs of February 09, 2015. FINDINGS: There is no evidence of fracture or dislocation. There is no evidence of arthropathy or other focal bone abnormality. Soft tissues are unremarkable. IMPRESSION: Normal left foot. Electronically Signed   By: Marijo Conception, M.D.   On: 04/27/2018 08:26      Follow-up Information    Schedule an appointment as soon as possible for a visit with Marybelle Killings, MD.   Specialty:  Orthopedic Surgery Why:  need return office one week postop Contact information: [place] [address] [phone]           Discharge Plan:  discharge to home  Disposition:     Signed: Benjiman Core  05/21/2018, 8:46 AM

## 2018-06-09 ENCOUNTER — Ambulatory Visit: Payer: Medicaid Other | Admitting: Family Medicine

## 2018-06-24 ENCOUNTER — Encounter (INDEPENDENT_AMBULATORY_CARE_PROVIDER_SITE_OTHER): Payer: Self-pay | Admitting: Orthopaedic Surgery

## 2018-06-24 ENCOUNTER — Ambulatory Visit (INDEPENDENT_AMBULATORY_CARE_PROVIDER_SITE_OTHER): Payer: Medicaid Other | Admitting: Orthopaedic Surgery

## 2018-06-24 VITALS — BP 132/89 | HR 108

## 2018-06-24 DIAGNOSIS — Z9889 Other specified postprocedural states: Secondary | ICD-10-CM

## 2018-06-24 NOTE — Progress Notes (Signed)
   Post-Op Visit Note   Patient: [name]           Date of Birth: [birth year]           MRN: [id number] Visit Date: 06/24/2018 PCP: Alfonse Spruce, FNP   Assessment & Plan: Patient returns 6 weeks post microdiscectomy done on 05/13/2018 on the left.  She is been walking she is got over 75% pain relief.  She still has about half bottle of her pain medication left and she could take a fourth or half a tablet at night if she needed.  She will continue her walking program and work slip given for work resumption on 06/29/2018 without restrictions.  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  She can return if she has increased problems.  Chief Complaint:  Chief Complaint  Patient presents with  . Lower Back - Routine Post Op    05/13/18 left L4-5 microdiscectomy   Visit Diagnoses: Post lumbar microdiscectomy.  Plan: Patient can resume work next week office follow-up here as needed she is happy with the surgical result.  Follow-Up Instructions: Return if symptoms worsen or fail to improve.   Orders:  No orders of the defined types were placed in this encounter.  No orders of the defined types were placed in this encounter.   Imaging: No results found.  PMFS History: Patient Active Problem List   Diagnosis Date Noted  . Left lumbar radiculitis 04/25/2017  . Lumbar degenerative disc disease 04/25/2017   Past Medical History:  Diagnosis Date  . Ankle sprain    left  . Anxiety    treated for panic attacks in the past.  . Asthma   . COPD (chronic obstructive pulmonary disease) ([place])   . Depression   . Irregular heart rate   . Renal disorder    kidney infections    Family History  Problem Relation Age of Onset  . Hypertension Mother   . Hypertension Father   . Heart disease Father     Past Surgical History:  Procedure Laterality Date  . APPENDECTOMY    . LUMBAR LAMINECTOMY N/A 05/13/2018   Procedure: LEFT LUMBAR FOUR-FIVE MICRODISCECTOMY;  Surgeon: Marybelle Killings, MD;  Location: [place] [facility];  Service: Orthopedics;  Laterality: N/A;  . TUBAL LIGATION    . TUBAL LIGATION  1998   Social History   Occupational History  . Not on file  Tobacco Use  . Smoking status: Current Every Day Smoker    Packs/day: 1.50    Years: 30.00    Pack years: 45.00    Types: Cigarettes  . Smokeless tobacco: Never Used  Substance and Sexual Activity  . Alcohol use: Yes    Comment: rare  . Drug use: No  . Sexual activity: Not on file

## 2018-07-22 ENCOUNTER — Telehealth (INDEPENDENT_AMBULATORY_CARE_PROVIDER_SITE_OTHER): Payer: Self-pay | Admitting: Orthopaedic Surgery

## 2018-07-22 MED ORDER — METHOCARBAMOL 500 MG PO TABS: 500.0000 mg | ORAL_TABLET | Freq: Two times a day (BID) | ORAL | 0 refills | Status: DC | PRN

## 2018-07-22 NOTE — Telephone Encounter (Signed)
Script sent to pharmacy. Patient advised.

## 2018-07-22 NOTE — Telephone Encounter (Signed)
OK robaxin 500mg  one po bid prn spasms.  # 25 thanks

## 2018-07-22 NOTE — Telephone Encounter (Signed)
Patient is requesting a rx for a muscle relaxer, she states shes been doing great but recently has been having muscle spasms. She uses [facility] on [address]. Patients # [phone]

## 2018-07-22 NOTE — Telephone Encounter (Signed)
Please advise 

## 2018-10-13 ENCOUNTER — Telehealth (INDEPENDENT_AMBULATORY_CARE_PROVIDER_SITE_OTHER): Payer: Self-pay | Admitting: Orthopaedic Surgery

## 2018-10-13 NOTE — Telephone Encounter (Signed)
Please advise 

## 2018-10-13 NOTE — Telephone Encounter (Signed)
voltaren 75 mg po bid # 60 ,   Robaxin 500mg  1 po bid prn # 40 thanks

## 2018-10-13 NOTE — Telephone Encounter (Signed)
Pt is requesting RX for inflammatory and a muscle relaxer.  Pt # [phone]

## 2018-10-14 MED ORDER — METHOCARBAMOL 500 MG PO TABS: 500.0000 mg | ORAL_TABLET | Freq: Two times a day (BID) | ORAL | 0 refills | Status: DC | PRN

## 2018-10-14 MED ORDER — DICLOFENAC SODIUM 75 MG PO TBEC: 75.0000 mg | DELAYED_RELEASE_TABLET | Freq: Two times a day (BID) | ORAL | 0 refills | Status: DC

## 2018-10-14 NOTE — Telephone Encounter (Signed)
Sent to pharmacy. Patient advised.  

## 2019-09-26 IMAGING — DX DG ANKLE COMPLETE 3+V*L*
3 series · 3 of 3 positions shown · non-contrast
Comparison: None.

CLINICAL DATA: Left ankle pain after fall.

EXAM:
LEFT ANKLE COMPLETE - 3+ VIEW

[ankle ap]
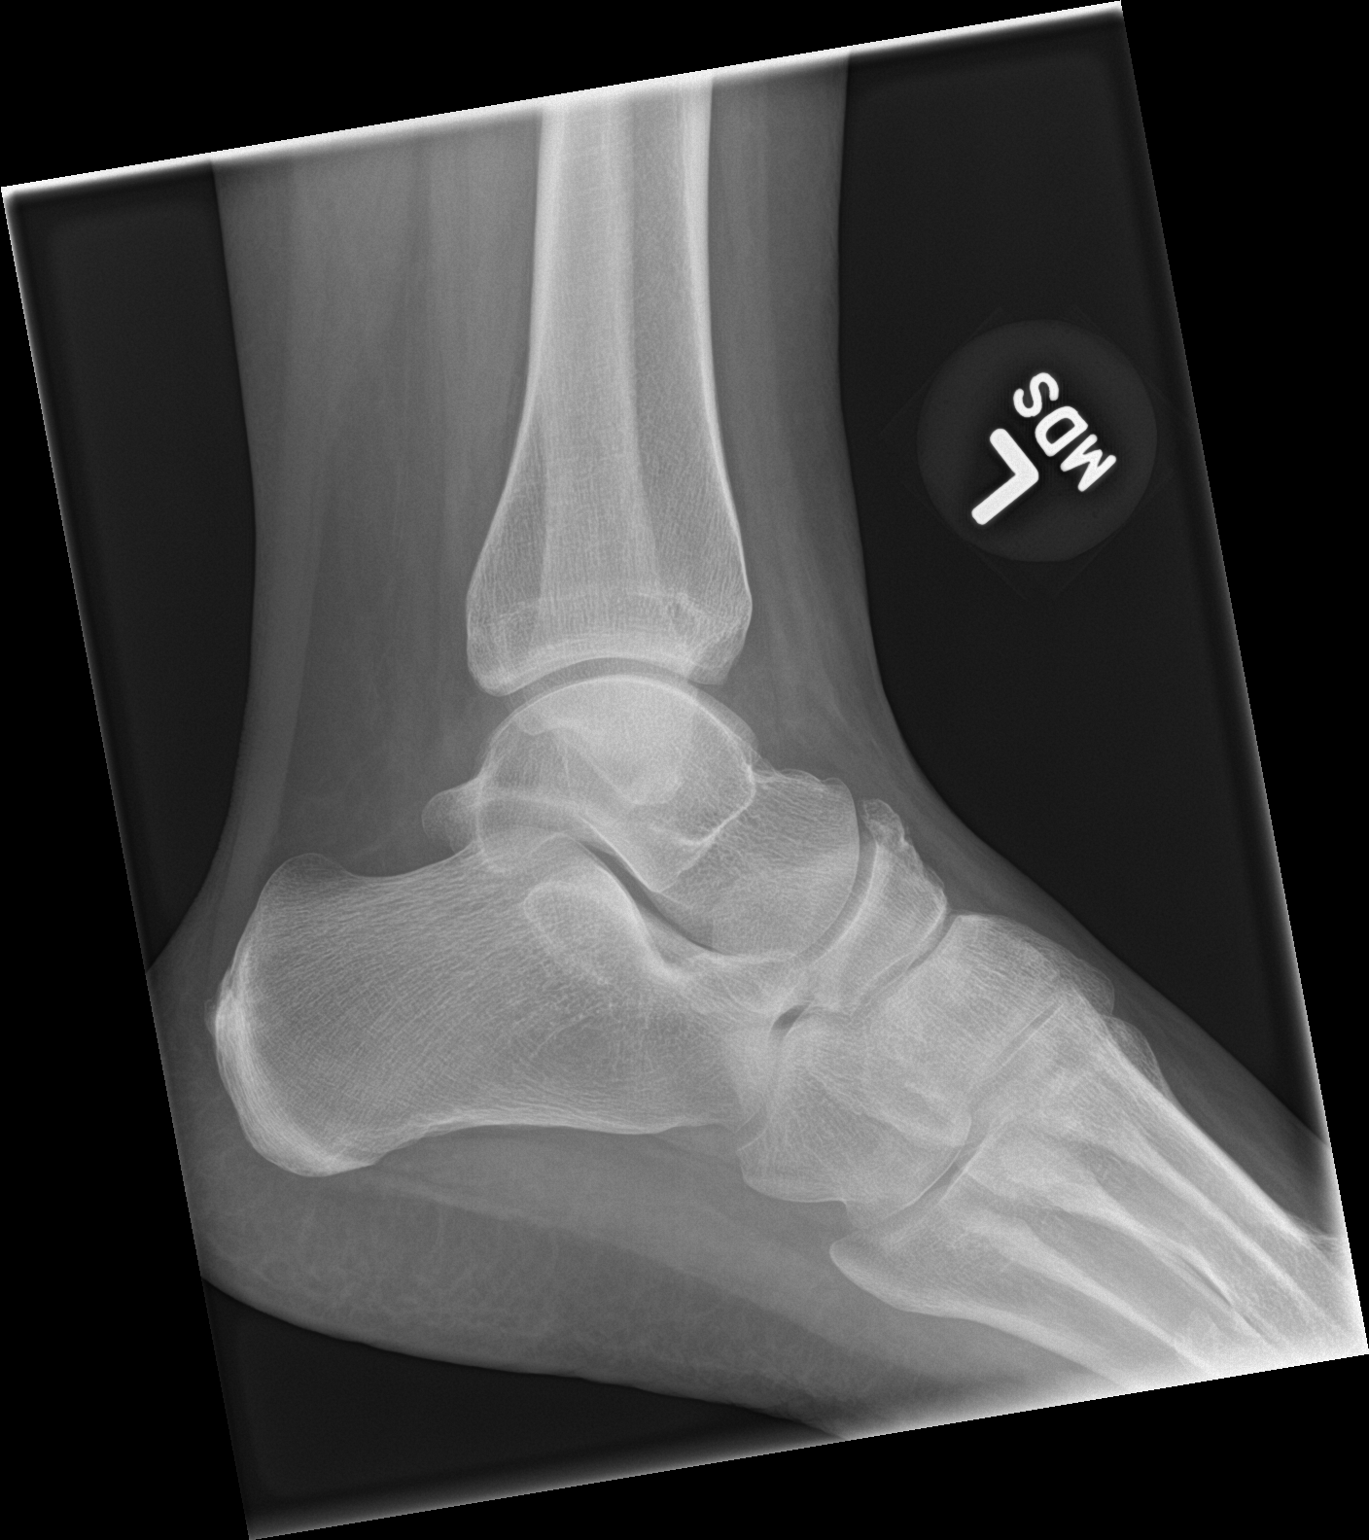

[ankle obl]
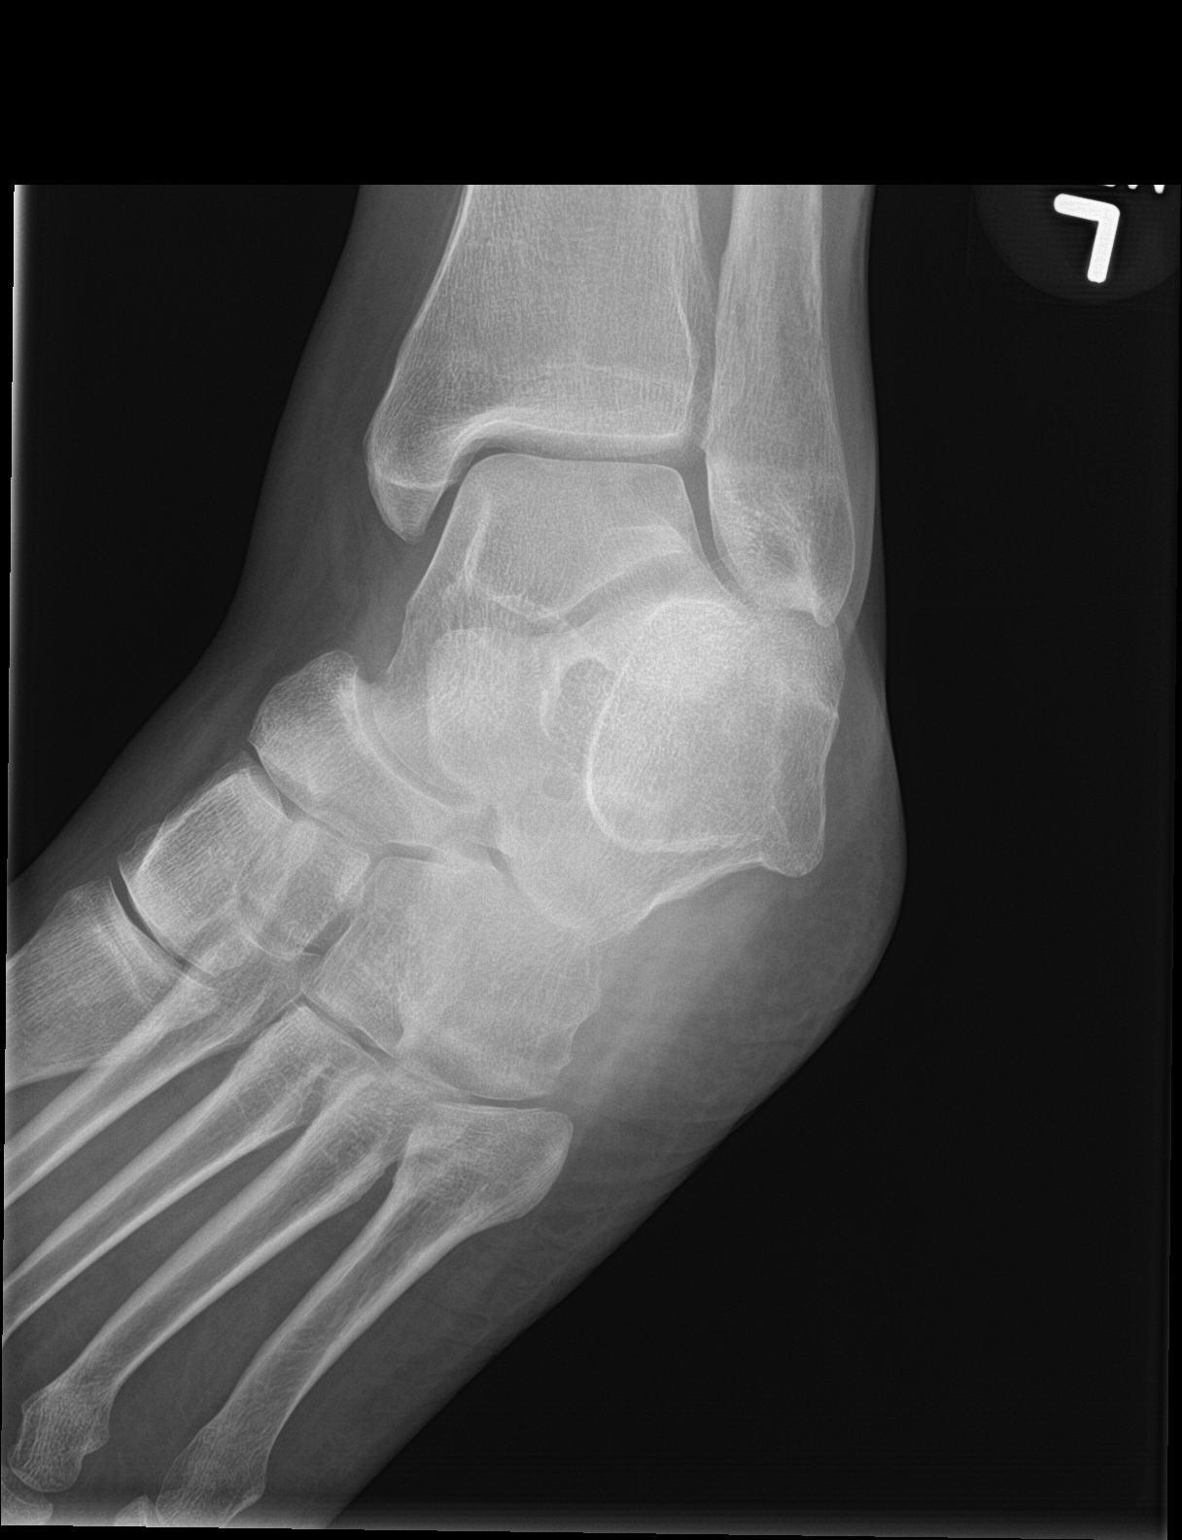

[ankle lat]
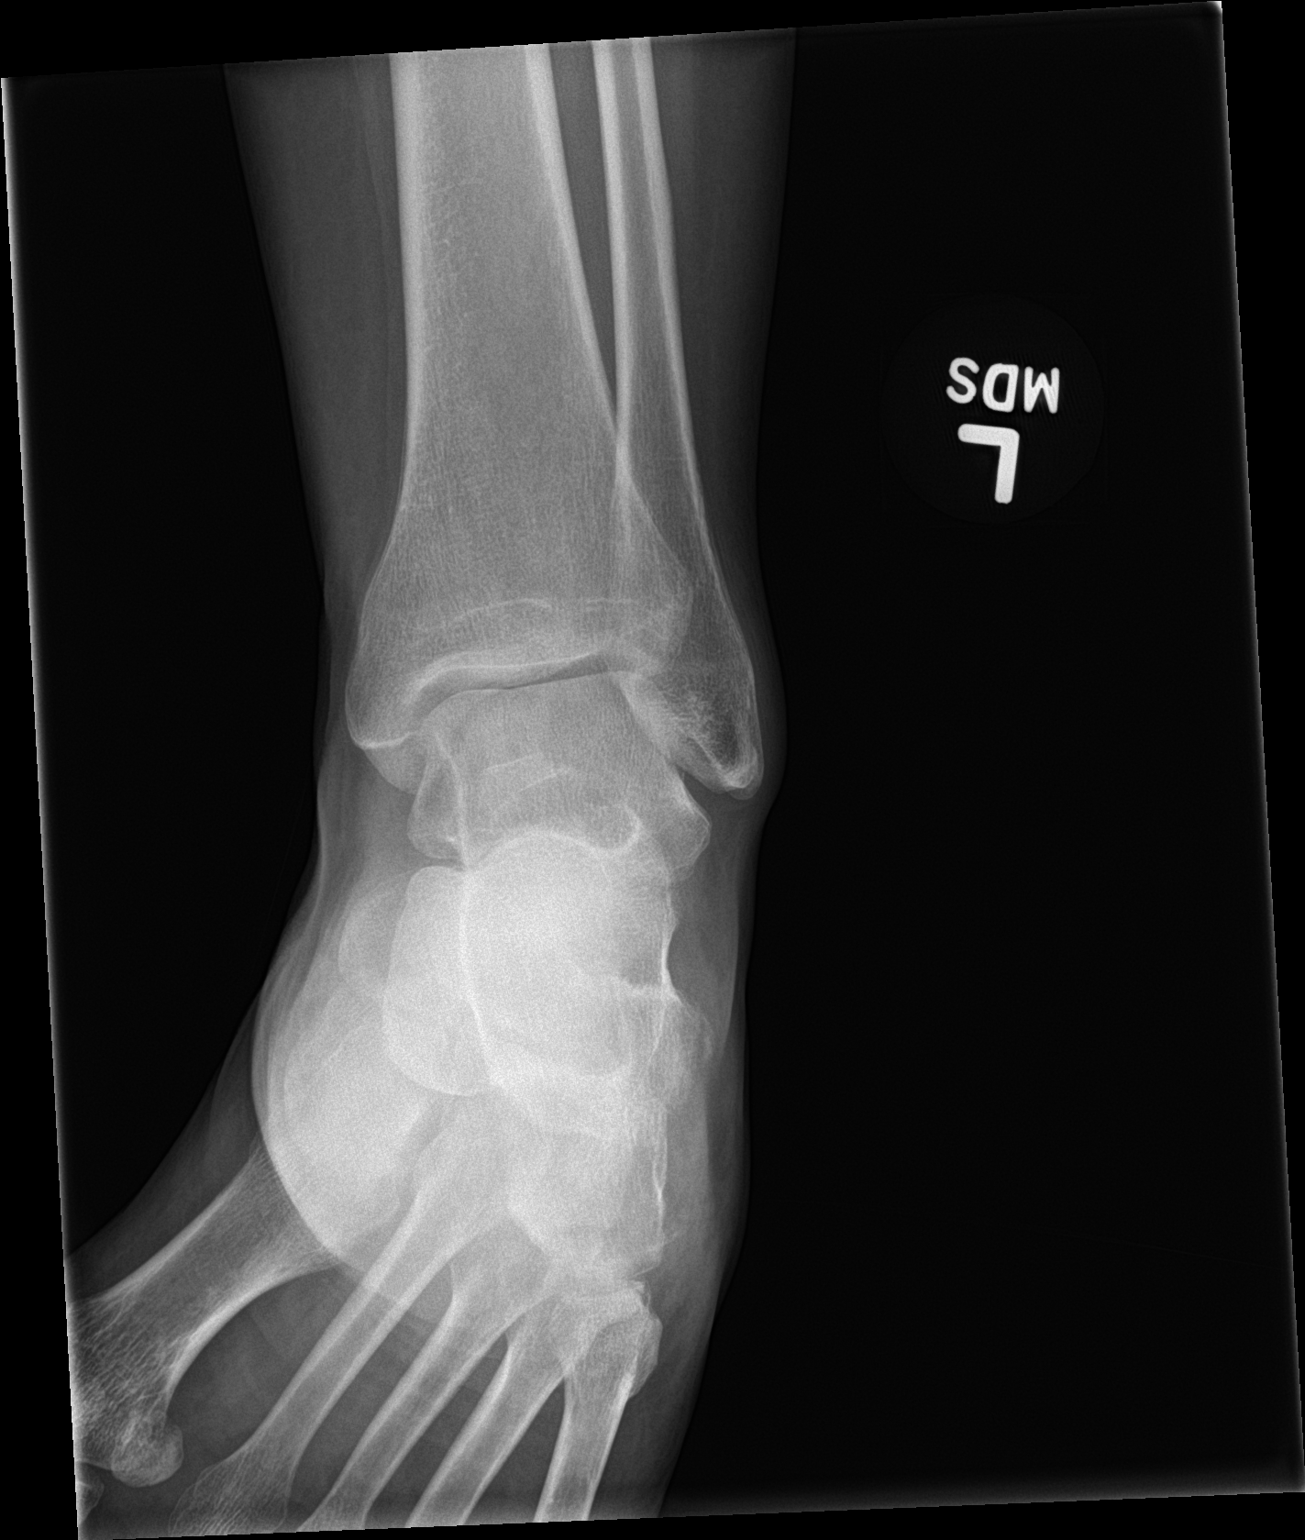

[3 of 3 positions shown; findings below may reference images not displayed]

FINDINGS: There is no evidence of fracture, dislocation, or joint effusion.
There is no evidence of arthropathy or other focal bone abnormality.
Soft tissues are unremarkable.
IMPRESSION: Normal left ankle.

## 2019-09-28 ENCOUNTER — Other Ambulatory Visit: Payer: Self-pay

## 2019-09-28 DIAGNOSIS — Z20822 Contact with and (suspected) exposure to covid-19: Secondary | ICD-10-CM

## 2019-09-29 LAB — NOVEL CORONAVIRUS, NAA: SARS-CoV-2, NAA: NOT DETECTED

## 2020-09-20 ENCOUNTER — Encounter: Payer: Self-pay | Admitting: Orthopaedic Surgery

## 2020-09-20 ENCOUNTER — Ambulatory Visit (INDEPENDENT_AMBULATORY_CARE_PROVIDER_SITE_OTHER): Payer: Medicaid Other | Admitting: Orthopaedic Surgery

## 2020-09-20 ENCOUNTER — Ambulatory Visit (INDEPENDENT_AMBULATORY_CARE_PROVIDER_SITE_OTHER): Payer: Medicaid Other

## 2020-09-20 ENCOUNTER — Other Ambulatory Visit: Payer: Self-pay

## 2020-09-20 VITALS — BP 110/77 | HR 101 | Ht 68.0 in | Wt 174.4 lb

## 2020-09-20 DIAGNOSIS — Z9889 Other specified postprocedural states: Secondary | ICD-10-CM | POA: Diagnosis not present

## 2020-09-20 DIAGNOSIS — M545 Low back pain, unspecified: Secondary | ICD-10-CM | POA: Diagnosis not present

## 2020-09-20 MED ORDER — PREDNISONE 10 MG (21) PO TBPK: ORAL_TABLET | Freq: Every day | ORAL | 0 refills | Status: DC

## 2020-09-20 NOTE — Progress Notes (Signed)
Office Visit Note   Patient: [name]           Date of Birth: 05-18-1971           MRN: [id number] Visit Date: 09/20/2020              Requested by: No referring provider defined for this encounter. PCP: No primary care provider on file.   Assessment & Plan: Visit Diagnoses:  1. Acute midline low back pain, unspecified whether sciatica present   2. Status post lumbar microdiscectomy     Plan: We will place her on prednisone 10 mg Dosepak.  She will let us know if she has persistent symptoms.  Follow-Up Instructions: Return if symptoms worsen or fail to improve.   Orders:  Orders Placed This Encounter  Procedures  . XR Lumbar Spine 2-3 Views   Meds ordered this encounter  Medications  . predniSONE (STERAPRED UNI-PAK 21 TAB) 10 MG (21) TBPK tablet    Sig: Take by mouth daily. As instructed, 6,5,4,3,2,1 with food one tablet less each day    Dispense:  21 tablet    Refill:  0      Procedures: No procedures performed   Clinical Data: No additional findings.   Subjective: Chief Complaint  Patient presents with  . Lower Back - Pain    HPI 49 year old female returns she had previous L4-5 microdiscectomy 05/13/2018 and had been doing well.  She been working at [employer] and she decided to go bowling a week ago and after bowling started having back pain left leg pain with numbness difficulty walking with aching in her back.  She is used Advil and Tylenol.  She had not had any back or leg symptoms since her surgery 2019.  No chills or fever no bowel bladder symptoms.  Review of Systems all other systems are noncontributory to HPI.   Objective: Vital Signs: BP 110/77   Pulse (!) 101   Ht 5\' 8"  (1.727 m)   Wt 174 lb 6.4 oz (79.1 kg)   BMI 26.52 kg/m   Physical Exam Constitutional:      Appearance: She is well-developed.  HENT:     Head: Normocephalic.     Right Ear: External ear normal.     Left Ear: External ear normal.  Eyes:     Pupils:  Pupils are equal, round, and reactive to light.  Neck:     Thyroid: No thyromegaly.     Trachea: No tracheal deviation.  Cardiovascular:     Rate and Rhythm: Normal rate.  Pulmonary:     Effort: Pulmonary effort is normal.  Abdominal:     Palpations: Abdomen is soft.  Skin:    General: Skin is warm and dry.  Neurological:     Mental Status: She is alert and oriented to person, place, and time.  Psychiatric:        Behavior: Behavior normal.     Ortho Exam lumbar incisions well-healed she has sciatic notch tenderness on the left some pain with straight leg raising 90 degrees.  She is able to heel and toe walk.  No gastrocsoleus atrophy.  Specialty Comments:  No specialty comments available.  Imaging: XR Lumbar Spine 2-3 Views  Result Date: 09/20/2020 AP lateral lumbar spine x-rays are obtained and reviewed.  This shows left laminotomy at L4.  No disc space narrowing no listhesis negative for acute changes. Impression: Normal lumbar x-rays other than previous laminotomy at L4 on the left.  PMFS History: Patient Active Problem List   Diagnosis Date Noted  . Status post lumbar microdiscectomy 06/24/2018  . Left lumbar radiculitis 04/25/2017  . Lumbar degenerative disc disease 04/25/2017   Past Medical History:  Diagnosis Date  . Ankle sprain    left  . Anxiety    treated for panic attacks in the past.  . Asthma   . COPD (chronic obstructive pulmonary disease) ([place])   . Depression   . Irregular heart rate   . Renal disorder    kidney infections    Family History  Problem Relation Age of Onset  . Hypertension Mother   . Hypertension Father   . Heart disease Father     Past Surgical History:  Procedure Laterality Date  . APPENDECTOMY    . LUMBAR LAMINECTOMY N/A 05/13/2018   Procedure: LEFT LUMBAR FOUR-FIVE MICRODISCECTOMY;  Surgeon: Marybelle Killings, MD;  Location: [facility];  Service: Orthopedics;  Laterality: N/A;  . TUBAL LIGATION    . TUBAL LIGATION  1998    Social History   Occupational History  . Not on file  Tobacco Use  . Smoking status: Current Every Day Smoker    Packs/day: 1.50    Years: 30.00    Pack years: 45.00    Types: Cigarettes  . Smokeless tobacco: Never Used  Vaping Use  . Vaping Use: Never used  Substance and Sexual Activity  . Alcohol use: Yes    Comment: rare  . Drug use: No  . Sexual activity: Not on file

## 2021-08-31 ENCOUNTER — Other Ambulatory Visit (HOSPITAL_COMMUNITY): Payer: Self-pay | Admitting: Physician Assistant

## 2021-08-31 ENCOUNTER — Other Ambulatory Visit (HOSPITAL_BASED_OUTPATIENT_CLINIC_OR_DEPARTMENT_OTHER): Payer: Self-pay | Admitting: Physician Assistant

## 2021-08-31 DIAGNOSIS — R918 Other nonspecific abnormal finding of lung field: Secondary | ICD-10-CM

## 2021-09-03 ENCOUNTER — Ambulatory Visit (HOSPITAL_BASED_OUTPATIENT_CLINIC_OR_DEPARTMENT_OTHER)
Admission: RE | Admit: 2021-09-03 | Discharge: 2021-09-03 | Disposition: A | Discharge status: Home / Self Care | Payer: Medicaid Other | Source: Ambulatory Visit | Attending: Physician Assistant | Admitting: Physician Assistant

## 2021-09-03 ENCOUNTER — Other Ambulatory Visit: Payer: Self-pay

## 2021-09-03 DIAGNOSIS — R918 Other nonspecific abnormal finding of lung field: Secondary | ICD-10-CM | POA: Diagnosis not present

## 2021-09-03 MED ORDER — IOHEXOL 300 MG/ML  SOLN
75.0000 mL | Freq: Once | INTRAMUSCULAR | Status: AC | PRN
  Administered 2021-09-03: 75 mL via INTRAVENOUS

## 2021-09-04 ENCOUNTER — Telehealth: Payer: Self-pay | Admitting: *Deleted

## 2021-09-04 DIAGNOSIS — R918 Other nonspecific abnormal finding of lung field: Secondary | ICD-10-CM

## 2021-09-04 NOTE — Telephone Encounter (Signed)
I updated Dr. Julien Nordmann on Ms. [name]'s referral. I called and updated patient on appt and per Dr. Julien Nordmann would like patient to be seen by rad onc. Referral completed.

## 2021-09-05 DIAGNOSIS — C3411 Malignant neoplasm of upper lobe, right bronchus or lung: Secondary | ICD-10-CM | POA: Insufficient documentation

## 2021-09-05 NOTE — Progress Notes (Signed)
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 no Pacemaker/ICD? no Possible current pregnancy? no Is the patient on methotrexate? no  Current Complaints / other details:  dry cough, denies hemoptysis, difficulty swallowing, shortness of breath at rest and with activity     Vitals:   09/10/21 1327  BP: (!) 119/92  Pulse: (!) 109  Resp: 18  Temp: 97.6 F (36.4 C)  SpO2: 98%  Weight: 174 lb 6.4 oz (79.1 kg)  Height: 5\' 8"  (1.727 m)

## 2021-09-06 ENCOUNTER — Telehealth: Payer: Self-pay | Admitting: *Deleted

## 2021-09-06 ENCOUNTER — Telehealth: Payer: Self-pay | Admitting: Pulmonary Disease

## 2021-09-06 DIAGNOSIS — C3411 Malignant neoplasm of upper lobe, right bronchus or lung: Secondary | ICD-10-CM

## 2021-09-06 NOTE — Telephone Encounter (Signed)
I called and updated [name] on cancer conference discussion and that she is being referred to pulmonary to discuss bx and they will call her with an appt. She sounded like she was crying so I paused and asked what I could do to help.  I listed as she explained her fears. I asked if she had support person and she states yes. I explained that we are working to expedite her care. I asked that she can call me with any questions if needed.

## 2021-09-06 NOTE — Telephone Encounter (Signed)
18 or 20th at [facility] would be best

## 2021-09-06 NOTE — Telephone Encounter (Signed)
PCCM:  Contacted by [facility] and medical oncology to expedite tissue biopsy.   We will work to set her up next week.   Garner Nash, DO [facility] 09/06/2021 7:41 AM

## 2021-09-06 NOTE — Telephone Encounter (Signed)
I called the pt, left a VM that we needed to review her scan, talk about next steps. She will need FOB + EBUS. I will call her back. Would like to schedule at [facility] 10/18, or 20  Thank you

## 2021-09-06 NOTE — Telephone Encounter (Signed)
I called the pt, left a VM that we needed to review her scan, talk about next steps. She will need FOB + EBUS. I will call her back. Would like to schedule at [facility] 10/18, 19 or 20

## 2021-09-09 NOTE — Progress Notes (Signed)
Radiation Oncology         [phone] ________________________________  Initial Outpatient Consultation  Name: [name] MRN: [id number]  Date: 09/10/2021  DOB: 1971-01-27  NI:[id number], Marva Panda, PA-C  Curt Bears, MD   REFERRING PHYSICIAN: Curt Bears, MD  DIAGNOSIS: The encounter diagnosis was Mass of right lung.   HISTORY OF PRESENT ILLNESS::[name] is a 50 y.o. female who is accompanied by her mother. she is seen as a courtesy of Dr. Julien Nordmann for an opinion concerning radiation therapy as part of management for her recently diagnosed right lung mass. The patient presented to the [facility]  on 09/03/21 with the chief complaint of right rib pain. Chest CT performed that same date demonstrated a 7.6 x 4.5 cm mass extending from right hilar region into the precarinal region as consistent with malignancy/metastatic disease. The mass was noted to seemingly severely compress the right upper lobe bronchus. In addition, a right paratracheal mass measuring 3.2 x 2.7 cm, and a 1.2 cm rounded density in the right upper lobe were also seen and noted as concerning for metastatic disease. A 1.4 x 1.1 cm posterior right apical density was also noted, though noted to possibly represent mass or scarring.    PREVIOUS RADIATION THERAPY: No  PAST MEDICAL HISTORY:  Past Medical History:  Diagnosis Date   Ankle sprain    left   Anxiety    treated for panic attacks in the past.   Asthma    COPD (chronic obstructive pulmonary disease) (HCC)    Depression    Irregular heart rate    Renal disorder    kidney infections    PAST SURGICAL HISTORY: Past Surgical History:  Procedure Laterality Date   APPENDECTOMY     LUMBAR LAMINECTOMY N/A 05/13/2018   Procedure: LEFT LUMBAR FOUR-FIVE MICRODISCECTOMY;  Surgeon: Marybelle Killings, MD;  Location: [facility];  Service: Orthopedics;  Laterality: N/A;   TUBAL LIGATION     TUBAL LIGATION  1998    FAMILY HISTORY:   Family History  Problem Relation Age of Onset   Hypertension Mother    Hypertension Father    Heart disease Father     SOCIAL 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 As needed for anxiety 30 tablet 0   ALPRAZolam (XANAX) 0.5 MG tablet alprazolam 0.5 mg tablet  TAKE 1 TABLET BY MOUTH THREE TIMES DAILY AS NEEDED     benzonatate (TESSALON) 200 MG capsule benzonatate 200 mg capsule  TAKE 1 CAPSULE BY MOUTH THREE TIMES DAILY FOR 10 DAYS AS NEEDED     diclofenac (VOLTAREN) 50 MG EC tablet diclofenac sodium 50 mg tablet,delayed release  Take 1 tablet twice a day by oral route for 7 days.     diclofenac Sodium (VOLTAREN) 1 % GEL diclofenac 1 % topical gel  APPLY 4 GRAMS TO THE AFFECTED AREA FOUR TIMES DAILY     doxycycline (VIBRAMYCIN) 100 MG capsule doxycycline hyclate 100 mg capsule  TAKE 1 CAPSULE BY MOUTH TWICE DAILY FOR 10 DAYS     HYDROcodone-acetaminophen (NORCO) 7.5-325 MG tablet hydrocodone 7.5 mg-acetaminophen 325 mg tablet  TAKE 1 TABLET BY MOUTH EVERY 6 HOURS FOR 5 DAYS AS NEEDED     oxyCODONE-acetaminophen (PERCOCET/ROXICET) 5-325 MG tablet Take 1 tablet by mouth every 4 (four) hours as needed for severe pain. 30 tablet 0   albuterol (PROVENTIL HFA;VENTOLIN HFA) 108 (90 Base) MCG/ACT inhaler Inhale 2 puffs into the lungs every 6 (six) hours as needed for wheezing or shortness of breath. (Patient not  taking: Reported on 09/10/2021) 1 Inhaler 2   diclofenac (VOLTAREN) 75 MG EC tablet Take 1 tablet (75 mg total) by mouth 2 (two) times daily. (Patient not taking: Reported on 09/10/2021) 60 tablet 0   fluticasone (FLONASE) 50 MCG/ACT nasal spray Place 1 spray into both nostrils daily as needed for allergies or rhinitis. (Patient not taking: Reported on 09/10/2021)     methocarbamol (ROBAXIN) 500 MG tablet Take 1 tablet (500 mg total) by mouth 2 (two) times daily as needed for muscle spasms. (Patient not taking: No sig reported) 40 tablet 0   predniSONE (STERAPRED UNI-PAK 21 TAB) 10 MG (21) TBPK tablet Take by mouth daily. As instructed, 6,5,4,3,2,1 with food one tablet less each day (Patient not taking: Reported on 09/10/2021) 21 tablet 0   No current facility-administered medications for this encounter.    REVIEW OF SYSTEMS:  A 10+ POINT REVIEW OF SYSTEMS WAS OBTAINED including neurology, dermatology, psychiatry, cardiac, respiratory, lymph, extremities, GI, GU, musculoskeletal, constitutional, reproductive, HEENT.  The patient reports pain along the anterior rib cage underneath her right breast.  She constantly holds this area during evaluation today.  She also reports dyspnea on exertion.  She denies any hemoptysis.  She has frequent coughing.  She in addition has noticed headaches.  She reports generalized blurry vision but no double vision.  She denies any other areas of pain.   PHYSICAL EXAM:  height is 5\' 8"  (1.727 m) and weight is 174 lb 6.4 oz (79.1 kg). Her temperature is 97.6 F (36.4 C). Her blood pressure is 119/92 (abnormal) and her pulse is 109 (abnormal). Her respiration is 18 and oxygen saturation is 98%.   General: Alert and oriented, in no acute distress, anxiety associated with situation HEENT: Head is normocephalic. Extraocular movements are intact. Oropharynx is clear. Neck: Neck is supple, no palpable cervical or supraclavicular lymphadenopathy. Heart: Regular in rate and  rhythm with no murmurs, rubs, or gallops. Chest: Clear to auscultation bilaterally, with no rhonchi, wheezes, or rales.  Tenderness with palpation in the inframammary fold of the right breast.  No palpable mass in this area or rib swelling. Abdomen: Soft, nontender, nondistended, with no rigidity or guarding. Extremities: No cyanosis or edema. Lymphatics: see Neck Exam Skin: No concerning lesions. Musculoskeletal: symmetric strength and muscle tone throughout. Neurologic: Cranial nerves II through XII are grossly intact. No obvious focalities. Speech is fluent. Coordination is intact. Psychiatric: Judgment and insight are intact. Affect is appropriate.   ECOG = 1  0 - Asymptomatic (Fully active, able to carry on all predisease activities without restriction)  1 - Symptomatic but completely ambulatory (Restricted in physically strenuous activity but ambulatory and able to carry out work of a light or sedentary nature. For example, light housework, office work)  2 - Symptomatic, <50%  in bed during the day (Ambulatory and capable of all self care but unable to carry out any work activities. Up and about more than 50% of waking hours)  3 - Symptomatic, >50% in bed, but not bedbound (Capable of only limited self-care, confined to bed or chair 50% or more of waking hours)  4 - Bedbound (Completely disabled. Cannot carry on any self-care. Totally confined to bed or chair)  5 - Death   Eustace Pen MM, Creech RH, Tormey DC, et al. [phone]). "Toxicity and response criteria of the [facility]". [place] Oncol. 5 (6): 649-55  LABORATORY DATA:  Lab Results  Component Value Date   WBC 10.9 (H) 05/07/2018   HGB 14.1 05/07/2018   HCT 42.4 05/07/2018   MCV 111.6 (H) 05/07/2018   PLT 358 05/07/2018   NEUTROABS 11.0 (H) 02/18/2017   Lab Results  Component Value Date   NA 138 05/07/2018   K 3.8 05/07/2018   CL 107 05/07/2018   CO2 21 (L) 05/07/2018   GLUCOSE 94 05/07/2018    CREATININE 0.74 05/07/2018   CALCIUM 9.5 05/07/2018      RADIOGRAPHY: CT CHEST W CONTRAST  Result Date: 09/03/2021 CLINICAL DATA:  Right rib pain, possible right hilar mass. EXAM: CT CHEST WITH CONTRAST TECHNIQUE: Multidetector CT imaging of the chest was performed during intravenous contrast administration. CONTRAST:  24mL OMNIPAQUE IOHEXOL 300 MG/ML  SOLN COMPARISON:  September 03, 2015. FINDINGS: Cardiovascular: No significant vascular findings. Normal heart size. No pericardial effusion. Mediastinum/Nodes: Thyroid gland and esophagus are unremarkable. Large mass measuring 7.6 x 4.5 cm is seen extending from right hilar region into precarinal region consistent with malignancy or metastatic disease, which appears to be severely compressing right upper lobe bronchus. 3.2 x 2.7 cm right paratracheal mass is noted as well. Lungs/Pleura: No pneumothorax or pleural effusion is noted. Emphysematous disease is noted bilaterally. Right upper lobe postobstructive atelectasis or pneumonia is noted. 1.2 cm rounded density is seen in the right upper lobe concerning for malignancy or metastatic disease. 1.4 x 1.1 cm posterior right apical density is noted which may represent mass or scarring. Upper Abdomen: No acute abnormality. Musculoskeletal: No chest wall abnormality. No acute or significant osseous findings. IMPRESSION: 7.6 x 4.5 cm mass is seen extending from right hilar region into precarinal region consistent with malignancy or metastatic disease. It appears to be severely compressing the right upper lobe bronchus. 3.2 x 2.7 cm right paratracheal mass is also noted concerning for metastatic disease. There does appear to be some degree of right upper lobe postobstructive atelectasis or possibly pneumonia. 1.2 cm rounded density is seen more laterally in the right upper lobe concerning for malignancy or metastatic disease. 1.4 x 1.1 cm posterior right apical density is noted which may represent mass or scarring.  Bronchoscopy is recommended for further evaluation. These results will be called to the ordering clinician or representative by the Radiologist Assistant, and communication documented in the PACS or zVision Dashboard. Emphysema (ICD10-J43.9). Electronically Signed   By: Marijo Conception M.D.   On: 09/03/2021 16:32      IMPRESSION: Right lung mass, biopsy pending  Clinical impression is the patient has stage IIIa non-small cell lung cancer.  She has a large mass measuring 7.6 x 4.5 cm in the right hilar area.  In addition there is mediastinal adenopathy and severe compression of the right upper lobe bronchus.  Today, I talked to the patient and mother about the findings and work-up thus far.  We discussed the natural history of presumptive  lung cancer and general treatment, highlighting the role of radiotherapy in the management.  We discussed the available radiation techniques, and focused on the details of logistics and delivery.  We reviewed the anticipated acute and late sequelae associated with radiation in this setting.  The patient was encouraged to ask questions that I answered to the best of my ability.  A patient consent form was discussed and signed.  We retained a copy for our records.  The patient would like to proceed with radiation and will be scheduled for CT simulation.  PLAN: The patient will proceed with CT simulation this afternoon so that we can expedite start of treatment when she has had biopsy and tissue confirmation of malignancy.  We will also order MRI of the brain and PET scan to complete staging work-up.  Anticipate radiation therapy starting October 25 or 26 depending on  anticipated biopsy results.   60 minutes of total time was spent for this patient encounter, including preparation, face-to-face counseling with the patient and coordination of care, physical exam, and documentation of the encounter.   ------------------------------------------------  Blair Promise,  PhD, MD  This document serves as a record of services personally performed by Gery Pray, MD. It was created on his behalf by Roney Mans, a trained medical scribe. The creation of this record is based on the scribe's personal observations and the provider's statements to them. This document has been checked and approved by the attending provider.

## 2021-09-10 ENCOUNTER — Ambulatory Visit
Admission: RE | Admit: 2021-09-10 | Discharge: 2021-09-10 | Disposition: A | Discharge status: Home / Self Care | Payer: Medicaid Other | Source: Ambulatory Visit | Attending: Radiation Oncology | Admitting: Radiation Oncology

## 2021-09-10 ENCOUNTER — Encounter: Payer: Self-pay | Admitting: Radiation Oncology

## 2021-09-10 ENCOUNTER — Other Ambulatory Visit: Payer: Self-pay

## 2021-09-10 VITALS — BP 119/92 | HR 109 | Temp 97.6°F | Resp 18 | Ht 68.0 in | Wt 174.4 lb

## 2021-09-10 DIAGNOSIS — C3411 Malignant neoplasm of upper lobe, right bronchus or lung: Secondary | ICD-10-CM

## 2021-09-10 DIAGNOSIS — J449 Chronic obstructive pulmonary disease, unspecified: Secondary | ICD-10-CM | POA: Insufficient documentation

## 2021-09-10 DIAGNOSIS — F17210 Nicotine dependence, cigarettes, uncomplicated: Secondary | ICD-10-CM | POA: Diagnosis not present

## 2021-09-10 DIAGNOSIS — R911 Solitary pulmonary nodule: Secondary | ICD-10-CM | POA: Insufficient documentation

## 2021-09-10 DIAGNOSIS — Z79899 Other long term (current) drug therapy: Secondary | ICD-10-CM | POA: Insufficient documentation

## 2021-09-10 DIAGNOSIS — R0781 Pleurodynia: Secondary | ICD-10-CM | POA: Diagnosis not present

## 2021-09-10 DIAGNOSIS — Z87440 Personal history of urinary (tract) infections: Secondary | ICD-10-CM | POA: Diagnosis not present

## 2021-09-10 DIAGNOSIS — R590 Localized enlarged lymph nodes: Secondary | ICD-10-CM | POA: Insufficient documentation

## 2021-09-10 DIAGNOSIS — R918 Other nonspecific abnormal finding of lung field: Secondary | ICD-10-CM

## 2021-09-10 MED ORDER — ALPRAZOLAM 0.25 MG PO TABS: 0.2500 mg | ORAL_TABLET | Freq: Three times a day (TID) | ORAL | 0 refills | Status: DC

## 2021-09-10 MED ORDER — OXYCODONE-ACETAMINOPHEN 5-325 MG PO TABS: 1.0000 | ORAL_TABLET | ORAL | 0 refills | Status: DC | PRN

## 2021-09-10 NOTE — Progress Notes (Signed)
See MD note for nursing evaluation. °

## 2021-09-11 ENCOUNTER — Encounter: Payer: Self-pay | Admitting: *Deleted

## 2021-09-11 NOTE — Progress Notes (Signed)
[facility] CSW Distress Screening Referral  Social Work Intern spoke to pt over the phone per distress screening protocol. Pt scored a 10 on the distress screen, indicating a severe level of distress.  ONCBCN DISTRESS SCREENING 09/10/2021  Screening Type Initial Screening  Distress experienced in past week (1-10) 10  Practical problem type Work/school  Emotional problem type Depression;Nervousness/Anxiety;Adjusting to illness;Feeling hopeless  Spiritual/Religous concerns type Loss of sense of purpose;Relating to God  Information Concerns Type Lack of info about diagnosis;Lack of info about treatment  Physical Problem type Pain;Breathing  Referral to clinical social work Yes    Pt was driving and requested a call back later this afternoon. Intern will call again today 10/18 at 3:30 pm to complete SDOH and Initial Assessment.  Rosary Lively, BSW Intern Supervised by Gwinda Maine, LCSW

## 2021-09-12 ENCOUNTER — Inpatient Hospital Stay: Payer: Medicaid Other | Attending: Internal Medicine | Admitting: Internal Medicine

## 2021-09-12 ENCOUNTER — Inpatient Hospital Stay (HOSPITAL_BASED_OUTPATIENT_CLINIC_OR_DEPARTMENT_OTHER): Payer: Medicaid Other | Admitting: *Deleted

## 2021-09-12 ENCOUNTER — Inpatient Hospital Stay: Payer: Medicaid Other

## 2021-09-12 ENCOUNTER — Telehealth: Payer: Self-pay | Admitting: Emergency Medicine

## 2021-09-12 ENCOUNTER — Other Ambulatory Visit: Payer: Self-pay | Admitting: Internal Medicine

## 2021-09-12 ENCOUNTER — Other Ambulatory Visit: Payer: Self-pay

## 2021-09-12 ENCOUNTER — Other Ambulatory Visit: Payer: Self-pay | Admitting: *Deleted

## 2021-09-12 VITALS — BP 119/84 | HR 98 | Temp 96.4°F | Resp 18 | Wt 179.6 lb

## 2021-09-12 DIAGNOSIS — R1110 Vomiting, unspecified: Secondary | ICD-10-CM

## 2021-09-12 DIAGNOSIS — C3411 Malignant neoplasm of upper lobe, right bronchus or lung: Secondary | ICD-10-CM

## 2021-09-12 DIAGNOSIS — R0600 Dyspnea, unspecified: Secondary | ICD-10-CM

## 2021-09-12 DIAGNOSIS — R918 Other nonspecific abnormal finding of lung field: Secondary | ICD-10-CM

## 2021-09-12 DIAGNOSIS — R059 Cough, unspecified: Secondary | ICD-10-CM

## 2021-09-12 DIAGNOSIS — Z8249 Family history of ischemic heart disease and other diseases of the circulatory system: Secondary | ICD-10-CM

## 2021-09-12 DIAGNOSIS — R5383 Other fatigue: Secondary | ICD-10-CM | POA: Diagnosis not present

## 2021-09-12 DIAGNOSIS — F17210 Nicotine dependence, cigarettes, uncomplicated: Secondary | ICD-10-CM

## 2021-09-12 DIAGNOSIS — R911 Solitary pulmonary nodule: Secondary | ICD-10-CM | POA: Diagnosis present

## 2021-09-12 DIAGNOSIS — M549 Dorsalgia, unspecified: Secondary | ICD-10-CM

## 2021-09-12 LAB — CBC WITH DIFFERENTIAL (CANCER CENTER ONLY)
Abs Immature Granulocytes: 0.03 10*3/uL (ref 0.00–0.07)
Basophils Absolute: 0.1 10*3/uL (ref 0.0–0.1)
Basophils Relative: 1 %
Eosinophils Absolute: 0.1 10*3/uL (ref 0.0–0.5)
Eosinophils Relative: 2 %
HCT: 42.5 % (ref 36.0–46.0)
Hemoglobin: 14.4 g/dL (ref 12.0–15.0)
Immature Granulocytes: 1 %
Lymphocytes Relative: 44 %
Lymphs Abs: 2.9 10*3/uL (ref 0.7–4.0)
MCH: 32.4 pg (ref 26.0–34.0)
MCHC: 33.9 g/dL (ref 30.0–36.0)
MCV: 95.5 fL (ref 80.0–100.0)
Monocytes Absolute: 0.6 10*3/uL (ref 0.1–1.0)
Monocytes Relative: 9 %
Neutro Abs: 2.8 10*3/uL (ref 1.7–7.7)
Neutrophils Relative %: 43 %
Platelet Count: 277 10*3/uL (ref 150–400)
RBC: 4.45 MIL/uL (ref 3.87–5.11)
RDW: 13.4 % (ref 11.5–15.5)
WBC Count: 6.5 10*3/uL (ref 4.0–10.5)
nRBC: 0 % (ref 0.0–0.2)

## 2021-09-12 LAB — CMP (CANCER CENTER ONLY)
ALT: 12 U/L (ref 0–44)
AST: 13 U/L — ABNORMAL LOW (ref 15–41)
Albumin: 3.7 g/dL (ref 3.5–5.0)
Alkaline Phosphatase: 86 U/L (ref 38–126)
Anion gap: 9 (ref 5–15)
BUN: 12 mg/dL (ref 6–20)
CO2: 26 mmol/L (ref 22–32)
Calcium: 10 mg/dL (ref 8.9–10.3)
Chloride: 105 mmol/L (ref 98–111)
Creatinine: 0.69 mg/dL (ref 0.44–1.00)
GFR, Estimated: >60 mL/min (ref >60)
Glucose, Bld: 78 mg/dL (ref 70–99)
Potassium: 3.7 mmol/L (ref 3.5–5.1)
Sodium: 140 mmol/L (ref 135–145)
Total Bilirubin: 0.3 mg/dL (ref 0.3–1.2)
Total Protein: 7.4 g/dL (ref 6.5–8.1)

## 2021-09-12 NOTE — Progress Notes (Signed)
Oncology Nurse Navigator Documentation  Oncology Nurse Navigator Flowsheets 09/12/2021  Abnormal Finding Date 09/03/2021  Diagnosis Status Additional Work Up  Navigator Follow Up Date: 09/14/2021  Navigator Follow Up Reason: Appointment Review  Navigator Location [facility]  Navigator Encounter Type Initial MedOnc/Met Ms. Maxton at her first appt to see Dr. Julien Nordmann.  Patient presented with abnormal CT scan.  Dr. Julien Nordmann reviewed her scan and updated her on next steps.  I contacted rad onc authorization coordinator and she is working on getting PET and MRI brain [place]. I was able to get an appt for both of these scans and gave her pre-procedure instructions for PET scan, nothing to eat 6 hours before and only sips of water.  She verbalized understanding.  I also did smoking cessation and gave her information for a class.  I explained that pulmonary tried to call her and they will be getting the bx.  I also gave her pulmonary's number and asked that she call to set up appt.  She again verbalized understanding.   Patient Visit Type Initial;MedOnc  Barriers/Navigation Needs Coordination of Care;Education  Education Other;Smoking cessation  Interventions Coordination of Care;Psycho-Social Support;Education  Acuity Level 3-Moderate Needs (3-4 Barriers Identified)  Coordination of Care Appts  Education Method Written;Verbal  Time Spent with Patient 78

## 2021-09-12 NOTE — Telephone Encounter (Signed)
I sent a new order requesting 10/28 to our [facility]

## 2021-09-12 NOTE — Telephone Encounter (Signed)
Called and spoke with patient's mother [name] (on [place] found in chart). She was returning the call from Dr. Lamonte Sakai last week about getting the patient setup for a biopsy. He was planning on doing the procedure on either 10/18 or 09/18/23 but the 02/14/23 has passed and the February 16, 2023 is tomorrow.   She is aware that I will send a message over to RB to let him know that she called back.   RB, can you please advise? Thanks!

## 2021-09-12 NOTE — Telephone Encounter (Signed)
It looks like I could do EBUS on 10/28 at [facility].  If the patient can do this then would have the [facility] try to get it arranged.

## 2021-09-12 NOTE — Progress Notes (Signed)
[place] [phone]   [phone]  CONSULT NOTE  REFERRING PHYSICIAN: Peggye Pitt, PA-C  REASON FOR CONSULTATION:  50 years old white female with highly suspicious lung cancer.  HPI [name] is a 50 y.o. female with past medical history significant for COPD, anxiety/depression, irregular heart rate, kidney infection as well as long history of heavy smoking.  The patient mentions that 3 weeks ago she was reaching out to the backseat in her car when she felt some pain on the right side of the chest.  She took over-the-counter medication with no improvement.  Her pain was getting worse and the patient started having more cough and shortness of breath.  She was seen by her primary care provider at one of the urgent care center and chest x-ray showed a suspicious lesion in her lung.  This was followed by CT scan of the chest on September 03, 2021 and that showed 7.6 x 4.5 cm mass seen extending from the right hilar region and to the precarinal region consistent with malignancy or metastatic disease it appears to be severely compressing the right upper lobe bronchus.  There was also 3.2 x 2.7 cm right paratracheal mass concerning for metastatic disease.  There does appear to be some degree of right upper lobe postobstructive atelectasis or possibly pneumonia.  There was also 1.2 cm rounded density seen more laterally in the right upper lobe concerning for malignancy or metastatic disease and 1.4 x 1.1 cm posterior right apical density also suspicious for a mass or scarring.  The patient was referred to me today for evaluation and recommendation regarding her condition. When seen today she continues to complain of right-sided chest pain as well as pain in the left shoulder blade and dry cough as well as shortness of breath but no hemoptysis.  She denied having any recent weight loss or night sweats.  She has no nausea but have one-time vomiting.  She also has  some headache and visual changes. Family history significant for father with heart disease.  Mother had diabetes mellitus and SVT. The patient is divorced and has 4 children with age between 73-20.  She was accompanied today by her mother [name].  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  She has a history of smoking more than 1 pack/day for around 35 years and unfortunately she continues to smoke.  She also drinks alcohol regularly and smokes marijuana.  She has no other history of drug abuse. HPI  Past Medical History:  Diagnosis Date   Ankle sprain    left   Anxiety    treated for panic attacks in the past.   Asthma    COPD (chronic obstructive pulmonary disease) (HCC)    Depression    Irregular heart rate    Renal disorder    kidney infections    Past Surgical History:  Procedure Laterality Date   APPENDECTOMY     LUMBAR LAMINECTOMY N/A 05/13/2018   Procedure: LEFT LUMBAR FOUR-FIVE MICRODISCECTOMY;  Surgeon: Marybelle Killings, MD;  Location: [facility];  Service: Orthopedics;  Laterality: N/A;   TUBAL LIGATION     TUBAL LIGATION  1998    Family History  Problem Relation Age of Onset   Hypertension Mother    Hypertension Father    Heart disease Father     Social History Social History   Tobacco Use   Smoking status: Every Day    Packs/day: 1.50  Years: 30.00    Pack years: 45.00    Types: Cigarettes   Smokeless tobacco: Never  Vaping Use   Vaping Use: Never used  Substance Use Topics   Alcohol use: Yes    Comment: rare   Drug use: No    Allergies  Allergen Reactions   Naproxen Nausea Only   Bactrim [Sulfamethoxazole-Trimethoprim] Nausea Only    Current Outpatient Medications  Medication Sig Dispense Refill   ALPRAZolam (XANAX) 0.25 MG tablet Take 1 tablet (0.25 mg total) by mouth 3 (three) times daily. As needed for anxiety 30 tablet 0   ALPRAZolam (XANAX) 0.5 MG tablet alprazolam 0.5 mg tablet  TAKE 1 TABLET BY MOUTH THREE TIMES DAILY AS NEEDED      benzonatate (TESSALON) 200 MG capsule benzonatate 200 mg capsule  TAKE 1 CAPSULE BY MOUTH THREE TIMES DAILY FOR 10 DAYS AS NEEDED     diclofenac (VOLTAREN) 50 MG EC tablet diclofenac sodium 50 mg tablet,delayed release  Take 1 tablet twice a day by oral route for 7 days.     diclofenac Sodium (VOLTAREN) 1 % GEL diclofenac 1 % topical gel  APPLY 4 GRAMS TO THE AFFECTED AREA FOUR TIMES DAILY     doxycycline (VIBRAMYCIN) 100 MG capsule doxycycline hyclate 100 mg capsule  TAKE 1 CAPSULE BY MOUTH TWICE DAILY FOR 10 DAYS     HYDROcodone-acetaminophen (NORCO) 7.5-325 MG tablet hydrocodone 7.5 mg-acetaminophen 325 mg tablet  TAKE 1 TABLET BY MOUTH EVERY 6 HOURS FOR 5 DAYS AS NEEDED     oxyCODONE-acetaminophen (PERCOCET/ROXICET) 5-325 MG tablet Take 1 tablet by mouth every 4 (four) hours as needed for severe pain. 30 tablet 0   albuterol (PROVENTIL HFA;VENTOLIN HFA) 108 (90 Base) MCG/ACT inhaler Inhale 2 puffs into the lungs every 6 (six) hours as needed for wheezing or shortness of breath. (Patient not taking: No sig reported) 1 Inhaler 2   diclofenac (VOLTAREN) 75 MG EC tablet Take 1 tablet (75 mg total) by mouth 2 (two) times daily. (Patient not taking: No sig reported) 60 tablet 0   fluticasone (FLONASE) 50 MCG/ACT nasal spray Place 1 spray into both nostrils daily as needed for allergies or rhinitis. (Patient not taking: No sig reported)     methocarbamol (ROBAXIN) 500 MG tablet Take 1 tablet (500 mg total) by mouth 2 (two) times daily as needed for muscle spasms. (Patient not taking: No sig reported) 40 tablet 0   predniSONE (STERAPRED UNI-PAK 21 TAB) 10 MG (21) TBPK tablet Take by mouth daily. As instructed, 6,5,4,3,2,1 with food one tablet less each day (Patient not taking: No sig reported) 21 tablet 0   No current facility-administered medications for this visit.    Review of Systems  Constitutional: positive for fatigue Eyes: negative Ears, nose, mouth, throat, and face:  negative Respiratory: positive for cough, dyspnea on exertion, and pleurisy/chest pain Cardiovascular: negative Gastrointestinal: positive for vomiting Genitourinary:negative Integument/breast: negative Hematologic/lymphatic: negative Musculoskeletal:positive for back pain Neurological: negative Behavioral/Psych: negative Endocrine: negative Allergic/Immunologic: negative  Physical Exam  CLE:XNTZG, healthy, no distress, well nourished, well developed, and anxious SKIN: skin color, texture, turgor are normal, no rashes or significant lesions HEAD: Normocephalic, No masses, lesions, tenderness or abnormalities EYES: normal, PERRLA, Conjunctiva are pink and non-injected EARS: External ears normal, Canals clear OROPHARYNX:no exudate, no erythema, and lips, buccal mucosa, and tongue normal  NECK: supple, no adenopathy, no JVD LYMPH:  no palpable lymphadenopathy, no hepatosplenomegaly BREAST:not examined LUNGS: clear to auscultation , and palpation HEART: regular rate & rhythm,  no murmurs, and no gallops ABDOMEN:abdomen soft, non-tender, normal bowel sounds, and no masses or organomegaly BACK: Back symmetric, no curvature., No CVA tenderness EXTREMITIES:no joint deformities, effusion, or inflammation, no edema  NEURO: alert & oriented x 3 with fluent speech, no focal motor/sensory deficits  PERFORMANCE STATUS: ECOG 1  LABORATORY DATA: Lab Results  Component Value Date   WBC 6.5 09/12/2021   HGB 14.4 09/12/2021   HCT 42.5 09/12/2021   MCV 95.5 09/12/2021   PLT 277 09/12/2021      Chemistry      Component Value Date/Time   NA 140 09/12/2021 1343   NA 139 02/18/2017 0932   K 3.7 09/12/2021 1343   CL 105 09/12/2021 1343   CO2 26 09/12/2021 1343   BUN 12 09/12/2021 1343   BUN 11 02/18/2017 0932   CREATININE 0.69 09/12/2021 1343      Component Value Date/Time   CALCIUM 10.0 09/12/2021 1343   ALKPHOS 86 09/12/2021 1343   AST 13 (L) 09/12/2021 1343   ALT 12 09/12/2021  1343   BILITOT 0.3 09/12/2021 1343       RADIOGRAPHIC STUDIES: CT CHEST W CONTRAST  Result Date: 09/03/2021 CLINICAL DATA:  Right rib pain, possible right hilar mass. EXAM: CT CHEST WITH CONTRAST TECHNIQUE: Multidetector CT imaging of the chest was performed during intravenous contrast administration. CONTRAST:  78mL OMNIPAQUE IOHEXOL 300 MG/ML  SOLN COMPARISON:  September 03, 2015. FINDINGS: Cardiovascular: No significant vascular findings. Normal heart size. No pericardial effusion. Mediastinum/Nodes: Thyroid gland and esophagus are unremarkable. Large mass measuring 7.6 x 4.5 cm is seen extending from right hilar region into precarinal region consistent with malignancy or metastatic disease, which appears to be severely compressing right upper lobe bronchus. 3.2 x 2.7 cm right paratracheal mass is noted as well. Lungs/Pleura: No pneumothorax or pleural effusion is noted. Emphysematous disease is noted bilaterally. Right upper lobe postobstructive atelectasis or pneumonia is noted. 1.2 cm rounded density is seen in the right upper lobe concerning for malignancy or metastatic disease. 1.4 x 1.1 cm posterior right apical density is noted which may represent mass or scarring. Upper Abdomen: No acute abnormality. Musculoskeletal: No chest wall abnormality. No acute or significant osseous findings. IMPRESSION: 7.6 x 4.5 cm mass is seen extending from right hilar region into precarinal region consistent with malignancy or metastatic disease. It appears to be severely compressing the right upper lobe bronchus. 3.2 x 2.7 cm right paratracheal mass is also noted concerning for metastatic disease. There does appear to be some degree of right upper lobe postobstructive atelectasis or possibly pneumonia. 1.2 cm rounded density is seen more laterally in the right upper lobe concerning for malignancy or metastatic disease. 1.4 x 1.1 cm posterior right apical density is noted which may represent mass or scarring.  Bronchoscopy is recommended for further evaluation. These results will be called to the ordering clinician or representative by the Radiologist Assistant, and communication documented in the PACS or zVision Dashboard. Emphysema (ICD10-J43.9). Electronically Signed   By: Marijo Conception M.D.   On: 09/03/2021 16:32    ASSESSMENT: This is a very pleasant 50 years old very anxious white female with highly suspicious lung cancer pending tissue diagnosis as well as staging work-up and presented with a large 7.6 mass extending from the right hilar into the precarinal region in addition to mediastinal lymphadenopathy and few other suspicious pulmonary masses and nodules.   PLAN: I had a lengthy discussion with the patient today about her current condition and  investigation to confirm her diagnosis as well as the staging work-up. I recommended for the patient to complete the staging work-up and they ordered a PET scan as well as MRI of the brain to rule out any other metastatic disease. I also referred the patient to pulmonary medicine, Dr. Lamonte Sakai for consideration of bronchoscopy with endobronchial ultrasound and biopsies for confirmation of the tissue diagnosis.  I communicated with Dr. Lamonte Sakai and he will try to get her for the biopsy soon. The patient was also referred to Dr. Sondra Come from radiation oncology for consideration of short palliative course of radiotherapy to the obstructive lesions in her lung. I will see her back for follow-up visit in 2 weeks for evaluation and more detailed discussion of her treatment options based on the pathology and staging work-up. For the cough the patient is currently on Tessalon Perles For pain management the patient was given prescription of Norco by Dr. Sondra Come. She was advised to call immediately if she has any other concerning symptoms in the interval. The patient voices understanding of current disease status and treatment options and is in agreement with the current  care plan.  All questions were answered. The patient knows to call the clinic with any problems, questions or concerns. We can certainly see the patient much sooner if necessary.  Thank you so much for allowing me to participate in the care of KAZARIA GAERTNER. I will continue to follow up the patient with you and assist in her care. The total time spent in the appointment was 60 minutes.  Disclaimer: This note was dictated with voice recognition software. Similar sounding words can inadvertently be transcribed and may not be corrected upon review.   Eilleen Kempf September 12, 2021, 2:47 PM

## 2021-09-12 NOTE — Telephone Encounter (Signed)
Called and spoke with [name]. She stated that the 10/28 date will work for them. She is aware that she will received a call with more information.

## 2021-09-13 ENCOUNTER — Encounter: Payer: Self-pay | Admitting: Emergency Medicine

## 2021-09-13 ENCOUNTER — Telehealth: Payer: Self-pay | Admitting: *Deleted

## 2021-09-13 NOTE — Telephone Encounter (Signed)
Hey Dr Lamonte Sakai I called to schedule this appt but she said they cant do [facility] that day due to no [facility] for anesthesia but they can do [facility] if you are ok with that

## 2021-09-13 NOTE — Telephone Encounter (Signed)
I have got this schedule @ [facility] on 10/28 at 7:30am Case #[id number] I will set up the covid test for 10/26 and I will contact the patient spoke to the mother and gave her the appt info

## 2021-09-13 NOTE — Telephone Encounter (Signed)
I called Ms. Parisien to follow up and see if they contacted the pulmonary office.  Ms. Brubacher did update me that her mother called and her bx is next week.  Patient states she is not having a good day today with pain on her right side.  I asked that she take her pain medication as prescribed but if that she did not get relieve to go to the ED.  She verbalized understanding.

## 2021-09-13 NOTE — Telephone Encounter (Signed)
Yes

## 2021-09-13 NOTE — Telephone Encounter (Signed)
Thank you :)

## 2021-09-15 ENCOUNTER — Other Ambulatory Visit: Payer: Self-pay

## 2021-09-15 ENCOUNTER — Emergency Department (HOSPITAL_COMMUNITY)
Admission: EM | Admit: 2021-09-15 | Discharge: 2021-09-15 | Disposition: A | Discharge status: Home / Self Care | Payer: Medicaid Other | Attending: Emergency Medicine | Admitting: Emergency Medicine

## 2021-09-15 ENCOUNTER — Encounter (HOSPITAL_COMMUNITY): Payer: Self-pay | Admitting: Emergency Medicine

## 2021-09-15 ENCOUNTER — Emergency Department (HOSPITAL_COMMUNITY): Payer: Medicaid Other

## 2021-09-15 DIAGNOSIS — C3411 Malignant neoplasm of upper lobe, right bronchus or lung: Secondary | ICD-10-CM | POA: Insufficient documentation

## 2021-09-15 DIAGNOSIS — R072 Precordial pain: Secondary | ICD-10-CM | POA: Diagnosis not present

## 2021-09-15 DIAGNOSIS — R0789 Other chest pain: Secondary | ICD-10-CM

## 2021-09-15 DIAGNOSIS — F17210 Nicotine dependence, cigarettes, uncomplicated: Secondary | ICD-10-CM | POA: Insufficient documentation

## 2021-09-15 DIAGNOSIS — J449 Chronic obstructive pulmonary disease, unspecified: Secondary | ICD-10-CM | POA: Insufficient documentation

## 2021-09-15 DIAGNOSIS — R0602 Shortness of breath: Secondary | ICD-10-CM | POA: Diagnosis present

## 2021-09-15 HISTORY — DX: Malignant neoplasm of unspecified part of unspecified bronchus or lung: C34.90

## 2021-09-15 LAB — BASIC METABOLIC PANEL
Anion gap: 6 (ref 5–15)
BUN: 17 mg/dL (ref 6–20)
CO2: 26 mmol/L (ref 22–32)
Calcium: 9.2 mg/dL (ref 8.9–10.3)
Chloride: 102 mmol/L (ref 98–111)
Creatinine, Ser: 0.65 mg/dL (ref 0.44–1.00)
GFR, Estimated: >60 mL/min (ref >60)
Glucose, Bld: 103 mg/dL — ABNORMAL HIGH (ref 70–99)
Potassium: 3.7 mmol/L (ref 3.5–5.1)
Sodium: 134 mmol/L — ABNORMAL LOW (ref 135–145)

## 2021-09-15 LAB — CBC WITH DIFFERENTIAL/PLATELET
Abs Immature Granulocytes: 0.04 10*3/uL (ref 0.00–0.07)
Basophils Absolute: 0 10*3/uL (ref 0.0–0.1)
Basophils Relative: 0 %
Eosinophils Absolute: 0.1 10*3/uL (ref 0.0–0.5)
Eosinophils Relative: 1 %
HCT: 42.6 % (ref 36.0–46.0)
Hemoglobin: 14.4 g/dL (ref 12.0–15.0)
Immature Granulocytes: 0 %
Lymphocytes Relative: 24 %
Lymphs Abs: 2.2 10*3/uL (ref 0.7–4.0)
MCH: 32.8 pg (ref 26.0–34.0)
MCHC: 33.8 g/dL (ref 30.0–36.0)
MCV: 97 fL (ref 80.0–100.0)
Monocytes Absolute: 0.7 10*3/uL (ref 0.1–1.0)
Monocytes Relative: 8 %
Neutro Abs: 6.4 10*3/uL (ref 1.7–7.7)
Neutrophils Relative %: 67 %
Platelets: 289 10*3/uL (ref 150–400)
RBC: 4.39 MIL/uL (ref 3.87–5.11)
RDW: 13.7 % (ref 11.5–15.5)
WBC: 9.5 10*3/uL (ref 4.0–10.5)
nRBC: 0 % (ref 0.0–0.2)

## 2021-09-15 LAB — TROPONIN I (HIGH SENSITIVITY)
Troponin I (High Sensitivity): <2 ng/L (ref <18)
Troponin I (High Sensitivity): <2 ng/L (ref <18)

## 2021-09-15 MED ORDER — SODIUM CHLORIDE 0.9 % IV SOLN: INTRAVENOUS | Status: DC

## 2021-09-15 MED ORDER — HYDROMORPHONE HCL 1 MG/ML IJ SOLN
1.0000 mg | Freq: Once | INTRAMUSCULAR | Status: AC
  Administered 2021-09-15: 1 mg via INTRAVENOUS
  Filled 2021-09-15: qty 1

## 2021-09-15 MED ORDER — ONDANSETRON HCL 4 MG/2ML IJ SOLN
4.0000 mg | Freq: Once | INTRAMUSCULAR | Status: AC
  Administered 2021-09-15: 4 mg via INTRAVENOUS
  Filled 2021-09-15: qty 2

## 2021-09-15 MED ORDER — IOHEXOL 350 MG/ML SOLN
80.0000 mL | Freq: Once | INTRAVENOUS | Status: AC | PRN
  Administered 2021-09-15: 80 mL via INTRAVENOUS

## 2021-09-15 MED ORDER — OXYCODONE-ACETAMINOPHEN 5-325 MG PO TABS
1.0000 | ORAL_TABLET | Freq: Once | ORAL | Status: AC
  Administered 2021-09-15: 1 via ORAL
  Filled 2021-09-15: qty 1

## 2021-09-15 MED ORDER — OXYCODONE-ACETAMINOPHEN 5-325 MG PO TABS: 1.0000 | ORAL_TABLET | Freq: Four times a day (QID) | ORAL | 0 refills | Status: DC | PRN

## 2021-09-15 MED ORDER — SODIUM CHLORIDE 0.9 % IV BOLUS
1000.0000 mL | Freq: Once | INTRAVENOUS | Status: AC
  Administered 2021-09-15: 1000 mL via INTRAVENOUS

## 2021-09-15 NOTE — Discharge Instructions (Signed)
Follow-up with hematology oncology or radiation oncology as scheduled.  Work-up here today without any acute findings on the left side of the chest.  And everything on the right side is stable.  No evidence of any blood clots.  Cardiac work-up without any acute cardiac events.  Take the Percocet as directed.

## 2021-09-15 NOTE — ED Notes (Signed)
Patient has no concerns after AVS has been reviewed and patient education provided. Patient discharged. 

## 2021-09-15 NOTE — ED Provider Notes (Addendum)
[facility] DEPT Provider Note   CSN: [id number] Arrival date & time: 09/15/21  1551     History Chief Complaint  Patient presents with   Cough   Pain    [name] is a 50 y.o. female.  Patient currently undergoing initial evaluation for a right suprahilar lung mass.  Patient had CT scan October 10 that showed this mass.  Has been plugged into hematology oncology and radiation oncology.  Bronchus pending.  Patient had labs on October 19 without any significant abnormalities in the complete metabolic panel or the CBC.  Patient on Wednesday, October 19 with development of left-sided chest pain top of the shoulder shoulder blade and anterior part of the chest.  Associated with shortness of breath.  Current oxygen saturations are 93% on room air.  Patient denies any legs swelling.  Patient does have a history of being heavy smoker.  Is known to have COPD not normally on oxygen.  Patient's pain is pleuritic in nature.  Worse with cough and with taking deep breath.      Past Medical History:  Diagnosis Date   Ankle sprain    left   Anxiety    treated for panic attacks in the past.   Asthma    COPD (chronic obstructive pulmonary disease) (HCC)    Depression    Irregular heart rate    Lung cancer (HCC)    Renal disorder    kidney infections    Patient Active Problem List   Diagnosis Date Noted   Malignant neoplasm of right upper lobe of lung ([place]) 09/05/2021   Status post lumbar microdiscectomy 06/24/2018   Left lumbar radiculitis 04/25/2017   Lumbar degenerative disc disease 04/25/2017    Past Surgical History:  Procedure Laterality Date   APPENDECTOMY     LUMBAR LAMINECTOMY N/A 05/13/2018   Procedure: LEFT LUMBAR FOUR-FIVE MICRODISCECTOMY;  Surgeon: Marybelle Killings, MD;  Location: [facility];  Service: Orthopedics;  Laterality: N/A;   TUBAL LIGATION     TUBAL LIGATION  1998     OB History   No obstetric history on file.     Family  History  Problem Relation Age of Onset   Hypertension Mother    Hypertension Father    Heart disease Father     Social History   Tobacco Use   Smoking status: Every Day    Packs/day: 1.50    Years: 30.00    Pack years: 45.00    Types: Cigarettes   Smokeless tobacco: Never  Vaping Use   Vaping Use: Never used  Substance Use Topics   Alcohol use: Yes    Comment: rare   Drug use: No    Home Medications Prior to Admission medications   Medication Sig Start Date End Date Taking? Authorizing Provider  albuterol (PROVENTIL HFA;VENTOLIN HFA) 108 (90 Base) MCG/ACT inhaler Inhale 2 puffs into the lungs every 6 (six) hours as needed for wheezing or shortness of breath. 03/13/17   Alfonse Spruce, FNP  ALPRAZolam (XANAX) 0.25 MG tablet Take 1 tablet (0.25 mg total) by mouth 3 (three) times daily. As needed for anxiety Patient taking differently: Take 0.25 mg by mouth 3 (three) times daily as needed for anxiety. As needed for anxiety 09/10/21   Gery Pray, MD  benzonatate (TESSALON) 200 MG capsule Take 200 mg by mouth 3 (three) times daily as needed for cough.    [provider]  doxycycline (VIBRAMYCIN) 100 MG capsule Take 100 mg  by mouth 2 (two) times daily.    [provider]  oxyCODONE-acetaminophen (PERCOCET/ROXICET) 5-325 MG tablet Take 1 tablet by mouth every 4 (four) hours as needed for severe pain. 09/10/21   Gery Pray, MD    Allergies    Naproxen and Bactrim [sulfamethoxazole-trimethoprim]  Review of Systems   Review of Systems  Constitutional:  Negative for chills and fever.  HENT:  Negative for ear pain and sore throat.   Eyes:  Negative for pain and visual disturbance.  Respiratory:  Positive for cough and shortness of breath.   Cardiovascular:  Positive for chest pain. Negative for palpitations.  Gastrointestinal:  Negative for abdominal pain and vomiting.  Genitourinary:  Negative for dysuria and hematuria.  Musculoskeletal:  Negative for  arthralgias and back pain.  Skin:  Negative for color change and rash.  Neurological:  Negative for seizures and syncope.  All other systems reviewed and are negative.  Physical Exam Updated Vital Signs BP 113/81   Pulse 100   Temp 98.2 F (36.8 C) (Oral)   Resp 19   Ht 1.727 m (5\' 8" )   Wt 81.2 kg   LMP 04/24/2018 Comment: tubal ligation  SpO2 91%   BMI 27.22 kg/m   Physical Exam Vitals and nursing note reviewed.  Constitutional:      General: She is not in acute distress.    Appearance: She is well-developed.  HENT:     Head: Normocephalic and atraumatic.  Eyes:     Extraocular Movements: Extraocular movements intact.     Conjunctiva/sclera: Conjunctivae normal.     Pupils: Pupils are equal, round, and reactive to light.  Cardiovascular:     Rate and Rhythm: Regular rhythm. Tachycardia present.     Heart sounds: No murmur heard. Pulmonary:     Effort: Pulmonary effort is normal. No respiratory distress.     Breath sounds: Normal breath sounds. No wheezing, rhonchi or rales.  Abdominal:     Palpations: Abdomen is soft.     Tenderness: There is no abdominal tenderness.  Musculoskeletal:        General: Normal range of motion.     Cervical back: Normal range of motion and neck supple.     Right lower leg: No edema.     Left lower leg: No edema.  Skin:    General: Skin is warm and dry.  Neurological:     General: No focal deficit present.     Mental Status: She is alert and oriented to person, place, and time.    ED Results / Procedures / Treatments   Labs (all labs ordered are listed, but only abnormal results are displayed) Labs Reviewed  BASIC METABOLIC PANEL - Abnormal; Notable for the following components:      Result Value   Sodium 134 (*)    Glucose, Bld 103 (*)    All other components within normal limits  CBC WITH DIFFERENTIAL/PLATELET  TROPONIN I (HIGH SENSITIVITY)  TROPONIN I (HIGH SENSITIVITY)  TROPONIN I (HIGH SENSITIVITY)    EKG EKG  Interpretation  Date/Time:  Saturday September 15 2021 17:23:07 EDT Ventricular Rate:  105 PR Interval:  194 QRS Duration: 97 QT Interval:  351 QTC Calculation: 464 R Axis:   80 Text Interpretation: Sinus tachycardia LAE, consider biatrial enlargement Confirmed by Fredia Sorrow [phone]) on 09/15/2021 5:44:32 PM  Radiology DG Chest 2 View  Result Date: 09/15/2021 CLINICAL DATA:  Shortness of breath. Recent diagnosis of lung cancer. Cough. Chest pain. EXAM: CHEST - 2  VIEW COMPARISON:  Chest CT 09/03/2021 FINDINGS: Normal heart size. Stable mediastinal contours with right suprahilar mass. Emphysema with chronic bronchial thickening. No new airspace disease. No pneumothorax or pleural effusion. No acute osseous abnormalities are seen. IMPRESSION: 1. Unchanged right suprahilar mass, recently assessed on chest CT. 2. No acute chest finding. Electronically Signed   By: Keith Rake M.D.   On: 09/15/2021 17:05   CT Angio Chest PE W/Cm &/Or Wo Cm  Result Date: 09/15/2021 CLINICAL DATA:  PE suspected, high prob Shortness of breath.  Recent diagnosis of lung cancer. EXAM: CT ANGIOGRAPHY CHEST WITH CONTRAST TECHNIQUE: Multidetector CT imaging of the chest was performed using the standard protocol during bolus administration of intravenous contrast. Multiplanar CT image reconstructions and MIPs were obtained to evaluate the vascular anatomy. CONTRAST:  64mL OMNIPAQUE IOHEXOL 350 MG/ML SOLN COMPARISON:  Radiograph earlier today. Chest CT 09/03/2021, 10 days ago FINDINGS: Cardiovascular: There are no filling defects within the pulmonary arteries to suggest pulmonary embolus. The right upper lobe pulmonary artery is attenuated and encased by right suprahilar mass, occluded distally, but no evidence of intraluminal thrombus. Mild aortic atherosclerosis without aneurysm. Heart is normal in size. No pericardial effusion or thickening. Mediastinum/Nodes: Presumed central right upper lobe mass is contiguous with  the right hilum and mediastinal adenopathy. It is difficult to accurately assess the size of the mass due to confluent adjacent lymph nodes, however mass and nodal conglomerate span at least 6.5 x 4.7 cm, series 5, image 118. This previously measured 6.1 x 4.6 cm on my retrospective measurement. This causes complete occlusion of the right upper lobe bronchus. There is right paratracheal adenopathy with lymph node measuring 2.5 cm short axis, series 4, image 41. There is a prominent subcarinal node. Enlarged right infrahilar nodes measure up to 16 mm, series 4, image 87. 9 mm high paratracheal node series 4, image 29. There is no contralateral left hilar adenopathy. Unremarkable CT appearance of the thyroid gland and esophagus. Lungs/Pleura: Right upper lobe suprahilar mass which is contiguous with adenopathy, as described above. Borders are irregular with multiple satellite nodules and ground-glass extending towards the apex. There is a separate subpleural nodule in the right upper lobe measuring 1.4 x 1.9 cm unchanged by my retrospective measurement, series 6, image 34. 5 mm subpleural right apical nodule, series 6, image 38. Paramediastinal atelectasis in the right upper lobe, likely postobstructive. No pulmonary nodules in the left lung. Moderate emphysema. No pleural fluid. Upper Abdomen: No adrenal nodule. No visualized liver lesion. No acute findings in the included upper abdomen. Musculoskeletal: No destructive lytic lesion. No blastic osseous lesion. No acute osseous abnormalities are seen. No chest wall soft tissue abnormality. Review of the MIP images confirms the above findings. IMPRESSION: 1. No pulmonary embolus. 2. Right hilar/suprahilar mass which is contiguous with adenopathy consistent with malignancy. This causes complete occlusion of the right upper lobe bronchus. The right upper lobe pulmonary artery is attenuated and encased by the right suprahilar mass, but no evidence of intraluminal thrombus.  3. Right paratracheal, right infrahilar, and subcarinal adenopathy. 4. Additional right upper lobe and right apical pulmonary nodules. 5. No definite acute findings or change from recent chest CT. 6. Moderate emphysema. Aortic Atherosclerosis (ICD10-I70.0) and Emphysema (ICD10-J43.9). Electronically Signed   By: Keith Rake M.D.   On: 09/15/2021 20:17    Procedures Procedures   Medications Ordered in ED Medications  0.9 %  sodium chloride infusion (has no administration in time range)  oxyCODONE-acetaminophen (PERCOCET/ROXICET) 5-325 MG per  tablet 1 tablet (1 tablet Oral Given 09/15/21 1645)  HYDROmorphone (DILAUDID) injection 1 mg (1 mg Intravenous Given 09/15/21 1808)  ondansetron (ZOFRAN) injection 4 mg (4 mg Intravenous Given 09/15/21 1808)  sodium chloride 0.9 % bolus 1,000 mL (1,000 mLs Intravenous New Bag/Given 09/15/21 1809)  iohexol (OMNIPAQUE) 350 MG/ML injection 80 mL (80 mLs Intravenous Contrast Given 09/15/21 1947)  HYDROmorphone (DILAUDID) injection 1 mg (1 mg Intravenous Given 09/15/21 2020)    ED Course  I have reviewed the triage vital signs and the nursing notes.  Pertinent labs & imaging results that were available during my care of the patient were reviewed by me and considered in my medical decision making (see chart for details).    MDM Rules/Calculators/A&P                           With patient's new diagnosis of the right upper lobe lung malignant neoplasm great concerns for possible pulmonary embolus.  Chest pain and back pain is on the left side of the chest.  Patient also tachycardic.  Oxygen sats in the low 90s.  Patient does have a history of COPD.  Patient will require CTA as well as troponins and will help her with the pain.  Pain is pleuritic in nature.   Surprisingly CT angio chest without evidence of pulmonary embolus.  Plus patient's pain is left-sided.  Initial troponin was normal.  But does need delta troponin since the pain was worse today.  If  delta troponin is normal then patient be discharged home with pain control.  Repeat troponin without any acute changes.  Both of them are 2 or less.  Suspect may be musculoskeletal pain on the left side.  I think is stable from her lung CA on the right side.  No evidence of pulmonary embolism.   Final Clinical Impression(s) / ED Diagnoses Final diagnoses:  Malignant neoplasm of upper lobe of right lung [facility])  Precordial pain    Rx / DC Orders ED Discharge Orders     None        Fredia Sorrow, MD 09/15/21 1743    Fredia Sorrow, MD 09/15/21 2140    Fredia Sorrow, MD 09/15/21 [phone]

## 2021-09-15 NOTE — ED Provider Notes (Signed)
Emergency Medicine Provider Triage Evaluation Note  [name] , a 50 y.o. female  was evaluated in triage.  Pt complains of shortness of breath and chest pain pain.  Patient was recently diagnosed with lung cancer has not started treatment yet.  Her pain is made worse with coughing, deep breathing, and moving.  She was told that if her pain became unbearable to come to the emergency department.  No recent illness or injury.  Review of Systems  Positive: Chest wall pain, nonproductive cough Negative: Fevers, chills, nominal pain, nausea, vomiting  Physical Exam  BP 113/86 (BP Location: Right Arm)   Pulse (!) 123   Temp 98.2 F (36.8 C) (Oral)   Resp (!) 22   SpO2 94%  Gen:   Awake, no distress   Resp:  Normal effort  MSK:   Moves extremities without difficulty  Other:  Patient with consistent nonproductive cough, difficult to obtain lung exam  Medical Decision Making  Medically screening exam initiated at 4:34 PM.  Appropriate orders placed.  CONCETTA GUION was informed that the remainder of the evaluation will be completed by another provider, this initial triage assessment does not replace that evaluation, and the importance of remaining in the ED until their evaluation is complete.     Kateri Plummer, PA-C 09/15/21 1636    Jeanell Sparrow, DO 09/15/21 2247

## 2021-09-15 NOTE — ED Triage Notes (Signed)
Reports newly dx w/ lung cancer, states she has not started trt yte, was told if pain got too bad to come to ED, hurts her lungs when she coughs. Non-productive cough.

## 2021-09-17 ENCOUNTER — Ambulatory Visit: Payer: Medicaid Other | Admitting: Radiation Oncology

## 2021-09-18 ENCOUNTER — Other Ambulatory Visit: Payer: Self-pay

## 2021-09-18 ENCOUNTER — Ambulatory Visit (HOSPITAL_COMMUNITY)
Admission: RE | Admit: 2021-09-18 | Discharge: 2021-09-18 | Disposition: A | Discharge status: Home / Self Care | Payer: Medicaid Other | Source: Ambulatory Visit | Attending: Radiation Oncology | Admitting: Radiation Oncology

## 2021-09-18 ENCOUNTER — Ambulatory Visit: Payer: Medicaid Other | Admitting: Radiation Oncology

## 2021-09-18 DIAGNOSIS — R918 Other nonspecific abnormal finding of lung field: Secondary | ICD-10-CM | POA: Insufficient documentation

## 2021-09-18 DIAGNOSIS — C3411 Malignant neoplasm of upper lobe, right bronchus or lung: Secondary | ICD-10-CM | POA: Diagnosis not present

## 2021-09-18 MED ORDER — GADOBUTROL 1 MMOL/ML IV SOLN
8.0000 mL | Freq: Once | INTRAVENOUS | Status: AC | PRN
  Administered 2021-09-18: 8 mL via INTRAVENOUS

## 2021-09-19 ENCOUNTER — Other Ambulatory Visit: Payer: Self-pay

## 2021-09-19 ENCOUNTER — Other Ambulatory Visit: Payer: Self-pay | Admitting: Emergency Medicine

## 2021-09-19 ENCOUNTER — Ambulatory Visit: Payer: Medicaid Other

## 2021-09-19 ENCOUNTER — Encounter (HOSPITAL_COMMUNITY): Payer: Self-pay | Admitting: Emergency Medicine

## 2021-09-19 LAB — SARS CORONAVIRUS 2 (TAT 6-24 HRS): SARS Coronavirus 2: NEGATIVE

## 2021-09-19 NOTE — Progress Notes (Signed)
PCP - [facility]  Cardiologist - Denies  EP-Denies  Endocrine-Denies  Pulm-Denies  Chest x-ray - 09/15/21 (E)  EKG - 09/15/21 (E)  Stress Test - Denies  ECHO - Denies  Cardiac Cath - Denies  AICD-na PM-na LOOP-na  Dialysis-Denies  Sleep Study - Denies CPAP - Denies  LABS- 09/15/21 (E): CBC w/D, BMP 09/19/21 (E): COVID- Neg  ASA-Denies  ERAS- No  HA1C-Denies  Anesthesia- Yes- recent ED visit 09/15/21  Pt denies having chest pain, sob, or fever during the pre-op phone call. All instructions explained to the pt, with a verbal understanding of the material including: as of today, stop taking all Aspirin (unless instructed by your doctor) and Other Aspirin containing products, Vitamins, Fish oils, and Herbal medications. Also stop all NSAIDS i.e. Advil, Ibuprofen, Motrin, Aleve, Anaprox, Naproxen, BC, Goody Powders, and all Supplements. Pt also instructed to wear a mask and social distance after being tested for COVID-19. The opportunity to ask questions was provided.    Coronavirus Screening  Have you experienced the following symptoms:  Cough yes/no: No Fever (>100.62F)  yes/no: No Runny nose yes/no: No Sore throat yes/no: No Difficulty breathing/shortness of breath  yes/no: No  Have you or a family member traveled in the last 14 days and where? yes/no: No   If the patient indicates "YES" to the above questions, their PAT will be rescheduled to limit the exposure to others and, the surgeon will be notified. THE PATIENT WILL NEED TO BE ASYMPTOMATIC FOR 14 DAYS.   If the patient is not experiencing any of these symptoms, the PAT nurse will instruct them to NOT bring anyone with them to their appointment since they may have these symptoms or traveled as well.   Please remind your patients and families that hospital visitation restrictions are in effect and the importance of the restrictions.

## 2021-09-20 ENCOUNTER — Other Ambulatory Visit: Payer: Self-pay | Admitting: Radiation Oncology

## 2021-09-20 ENCOUNTER — Ambulatory Visit: Payer: Medicaid Other

## 2021-09-20 MED ORDER — ALPRAZOLAM 0.25 MG PO TABS: 0.2500 mg | ORAL_TABLET | Freq: Three times a day (TID) | ORAL | 0 refills | Status: DC

## 2021-09-21 ENCOUNTER — Encounter (HOSPITAL_COMMUNITY)
Admission: RE | Disposition: A | Discharge status: Home / Self Care | Payer: Self-pay | Source: Home / Self Care | Attending: Emergency Medicine

## 2021-09-21 ENCOUNTER — Ambulatory Visit (HOSPITAL_COMMUNITY)
Admission: RE | Admit: 2021-09-21 | Discharge: 2021-09-21 | Disposition: A | Discharge status: Home / Self Care | Payer: Medicaid Other | Attending: Emergency Medicine | Admitting: Emergency Medicine

## 2021-09-21 ENCOUNTER — Ambulatory Visit (HOSPITAL_COMMUNITY): Payer: Medicaid Other | Admitting: Physician Assistant

## 2021-09-21 ENCOUNTER — Ambulatory Visit: Payer: Medicaid Other

## 2021-09-21 ENCOUNTER — Ambulatory Visit (HOSPITAL_COMMUNITY): Payer: Medicaid Other

## 2021-09-21 ENCOUNTER — Other Ambulatory Visit: Payer: Self-pay

## 2021-09-21 ENCOUNTER — Encounter (HOSPITAL_COMMUNITY): Payer: Self-pay | Admitting: Emergency Medicine

## 2021-09-21 DIAGNOSIS — F17210 Nicotine dependence, cigarettes, uncomplicated: Secondary | ICD-10-CM | POA: Insufficient documentation

## 2021-09-21 DIAGNOSIS — Z886 Allergy status to analgesic agent status: Secondary | ICD-10-CM | POA: Insufficient documentation

## 2021-09-21 DIAGNOSIS — Z79899 Other long term (current) drug therapy: Secondary | ICD-10-CM | POA: Insufficient documentation

## 2021-09-21 DIAGNOSIS — J449 Chronic obstructive pulmonary disease, unspecified: Secondary | ICD-10-CM | POA: Diagnosis not present

## 2021-09-21 DIAGNOSIS — C771 Secondary and unspecified malignant neoplasm of intrathoracic lymph nodes: Secondary | ICD-10-CM | POA: Insufficient documentation

## 2021-09-21 DIAGNOSIS — R918 Other nonspecific abnormal finding of lung field: Secondary | ICD-10-CM | POA: Diagnosis present

## 2021-09-21 DIAGNOSIS — Z881 Allergy status to other antibiotic agents status: Secondary | ICD-10-CM | POA: Diagnosis not present

## 2021-09-21 DIAGNOSIS — R0602 Shortness of breath: Secondary | ICD-10-CM

## 2021-09-21 DIAGNOSIS — C3411 Malignant neoplasm of upper lobe, right bronchus or lung: Secondary | ICD-10-CM | POA: Insufficient documentation

## 2021-09-21 HISTORY — PX: VIDEO BRONCHOSCOPY WITH ENDOBRONCHIAL ULTRASOUND: SHX6177

## 2021-09-21 HISTORY — PX: BRONCHIAL NEEDLE ASPIRATION BIOPSY: SHX5106

## 2021-09-21 HISTORY — PX: BRONCHIAL BIOPSY: SHX5109

## 2021-09-21 HISTORY — PX: BRONCHIAL BRUSHINGS: SHX5108

## 2021-09-21 SURGERY — BRONCHOSCOPY, WITH EBUS
Anesthesia: General

## 2021-09-21 MED ORDER — FENTANYL CITRATE (PF) 100 MCG/2ML IJ SOLN
100.0000 ug | Freq: Once | INTRAMUSCULAR | Status: AC
  Administered 2021-09-21: 100 ug via INTRAVENOUS

## 2021-09-21 MED ORDER — ALBUTEROL SULFATE HFA 108 (90 BASE) MCG/ACT IN AERS
INHALATION_SPRAY | RESPIRATORY_TRACT | Status: DC | PRN
  Administered 2021-09-21: 6 via RESPIRATORY_TRACT
  Administered 2021-09-21: 2 via RESPIRATORY_TRACT

## 2021-09-21 MED ORDER — CHLORHEXIDINE GLUCONATE 0.12 % MT SOLN
OROMUCOSAL | Status: AC
  Administered 2021-09-21: 15 mL via OROMUCOSAL
  Filled 2021-09-21: qty 15

## 2021-09-21 MED ORDER — ONDANSETRON HCL 4 MG/2ML IJ SOLN
INTRAMUSCULAR | Status: DC | PRN
  Administered 2021-09-21 (×2): 4 mg via INTRAVENOUS

## 2021-09-21 MED ORDER — PHENYLEPHRINE 40 MCG/ML (10ML) SYRINGE FOR IV PUSH (FOR BLOOD PRESSURE SUPPORT)
PREFILLED_SYRINGE | INTRAVENOUS | Status: DC | PRN
  Administered 2021-09-21: 80 ug via INTRAVENOUS
  Administered 2021-09-21 (×2): 160 ug via INTRAVENOUS

## 2021-09-21 MED ORDER — ROCURONIUM BROMIDE 10 MG/ML (PF) SYRINGE
PREFILLED_SYRINGE | INTRAVENOUS | Status: DC | PRN
  Administered 2021-09-21: 80 mg via INTRAVENOUS

## 2021-09-21 MED ORDER — PROMETHAZINE HCL 25 MG/ML IJ SOLN: 6.2500 mg | INTRAMUSCULAR | Status: DC | PRN

## 2021-09-21 MED ORDER — FENTANYL CITRATE (PF) 100 MCG/2ML IJ SOLN: 25.0000 ug | INTRAMUSCULAR | Status: DC | PRN

## 2021-09-21 MED ORDER — LIDOCAINE 2% (20 MG/ML) 5 ML SYRINGE
INTRAMUSCULAR | Status: DC | PRN
  Administered 2021-09-21: 60 mg via INTRAVENOUS

## 2021-09-21 MED ORDER — FENTANYL CITRATE (PF) 250 MCG/5ML IJ SOLN
INTRAMUSCULAR | Status: DC | PRN
  Administered 2021-09-21: 100 ug via INTRAVENOUS

## 2021-09-21 MED ORDER — SUGAMMADEX SODIUM 200 MG/2ML IV SOLN
INTRAVENOUS | Status: DC | PRN
  Administered 2021-09-21: 200 mg via INTRAVENOUS

## 2021-09-21 MED ORDER — IPRATROPIUM-ALBUTEROL 0.5-2.5 (3) MG/3ML IN SOLN
3.0000 mL | Freq: Once | RESPIRATORY_TRACT | Status: AC
  Administered 2021-09-21: 3 mL via RESPIRATORY_TRACT

## 2021-09-21 MED ORDER — AMISULPRIDE (ANTIEMETIC) 5 MG/2ML IV SOLN: 10.0000 mg | Freq: Once | INTRAVENOUS | Status: DC | PRN

## 2021-09-21 MED ORDER — LACTATED RINGERS IV SOLN: INTRAVENOUS | Status: DC

## 2021-09-21 MED ORDER — IPRATROPIUM-ALBUTEROL 0.5-2.5 (3) MG/3ML IN SOLN
RESPIRATORY_TRACT | Status: AC
  Filled 2021-09-21: qty 3

## 2021-09-21 MED ORDER — HYDROMORPHONE HCL 1 MG/ML IJ SOLN
INTRAMUSCULAR | Status: AC
  Filled 2021-09-21: qty 1

## 2021-09-21 MED ORDER — PROPOFOL 10 MG/ML IV BOLUS
INTRAVENOUS | Status: DC | PRN
  Administered 2021-09-21: 200 mg via INTRAVENOUS

## 2021-09-21 MED ORDER — CHLORHEXIDINE GLUCONATE 0.12 % MT SOLN
15.0000 mL | Freq: Once | OROMUCOSAL | Status: AC
  Filled 2021-09-21: qty 15

## 2021-09-21 MED ORDER — DEXAMETHASONE SODIUM PHOSPHATE 10 MG/ML IJ SOLN
INTRAMUSCULAR | Status: DC | PRN
  Administered 2021-09-21: 10 mg via INTRAVENOUS

## 2021-09-21 MED ORDER — MIDAZOLAM HCL 2 MG/2ML IJ SOLN
INTRAMUSCULAR | Status: DC | PRN
  Administered 2021-09-21: 2 mg via INTRAVENOUS

## 2021-09-21 MED ORDER — ONDANSETRON HCL 4 MG/2ML IJ SOLN
INTRAMUSCULAR | Status: AC
  Filled 2021-09-21: qty 2

## 2021-09-21 MED ORDER — FENTANYL CITRATE (PF) 100 MCG/2ML IJ SOLN
INTRAMUSCULAR | Status: AC
  Filled 2021-09-21: qty 2

## 2021-09-21 MED ORDER — HYDROMORPHONE HCL 1 MG/ML IJ SOLN
0.5000 mg | INTRAMUSCULAR | Status: DC | PRN
  Administered 2021-09-21: 0.5 mg via INTRAVENOUS

## 2021-09-21 MED ORDER — MEPERIDINE HCL 100 MG/ML IJ SOLN: 6.2500 mg | INTRAMUSCULAR | Status: DC | PRN

## 2021-09-21 MED ORDER — OXYCODONE HCL 5 MG PO TABS: 5.0000 mg | ORAL_TABLET | Freq: Once | ORAL | Status: DC

## 2021-09-21 NOTE — Anesthesia Procedure Notes (Signed)
Procedure Name: Intubation Date/Time: 09/21/2021 7:50 AM Performed by: Bryson Corona, CRNA Pre-anesthesia Checklist: Patient identified, Emergency Drugs available, Suction available and Patient being monitored Patient Re-evaluated:Patient Re-evaluated prior to induction Oxygen Delivery Method: Circle System Utilized Preoxygenation: Pre-oxygenation with 100% oxygen Induction Type: IV induction Ventilation: Mask ventilation without difficulty Laryngoscope Size: Mac and 3 Grade View: Grade I Tube type: Oral Tube size: 8.5 mm Number of attempts: 1 Airway Equipment and Method: Stylet and Oral airway Placement Confirmation: ETT inserted through vocal cords under direct vision, positive ETCO2 and breath sounds checked- equal and bilateral Secured at: 24 cm Tube secured with: Tape Dental Injury: Teeth and Oropharynx as per pre-operative assessment

## 2021-09-21 NOTE — Op Note (Signed)
Video Bronchoscopy with Endobronchial Ultrasound Procedure Note  Date of Operation: 09/21/2021  Pre-op Diagnosis: Right hilar mass  Post-op Diagnosis: Same  Surgeon: Baltazar Apo  Assistants: None  Anesthesia: General endotracheal anesthesia  Operation: Flexible video fiberoptic bronchoscopy with endobronchial ultrasound and biopsies.  Estimated Blood Loss: Minimal  Complications: None apparent  Indications and History: [name] is a 50 y.o. female with history tobacco use.  She was evaluated for chest pain found to have a right hilar mass with suprahilar and mediastinal lymphadenopathy and extension on CT chest.  Recommendation was made to achieve tissue diagnosis via bronchoscopy with endobronchial ultrasound and biopsies.  The risks, benefits, complications, treatment options and expected outcomes were discussed with the patient.  The possibilities of pneumothorax, pneumonia, reaction to medication, pulmonary aspiration, perforation of a viscus, bleeding, failure to diagnose a condition and creating a complication requiring transfusion or operation were discussed with the patient who freely signed the consent.    Description of Procedure: The patient was examined in the preoperative area and history and data from the preprocedure consultation were reviewed. It was deemed appropriate to proceed.  The patient was taken to [facility] endoscopy room 3, identified as DISAYA WALT and the procedure verified as Flexible Video Fiberoptic Bronchoscopy.  A Time Out was held and the above information confirmed. After being taken to the operating room general anesthesia was initiated and the patient  was orally intubated. The video fiberoptic bronchoscope was introduced via the endotracheal tube and a general inspection was performed which showed normal airways on the left.  There was narrowing and large-scale occlusion of the right upper lobe airway at the orifice due to an endobronchial  lesion consistent with malignancy.  The bronchus intermedius, right lower lobe and right middle lobe airways all looked normal.  Endobronchial brushings and endobronchial biopsies were performed in the right upper lobe airway to be sent for cytology and pathology.  The standard scope was then withdrawn and the endobronchial ultrasound was used to identify and characterize the peritracheal, hilar and bronchial lymph nodes. Inspection showed massive enlargement of all of the right sided nodes.  This was particularly true at station 4R, 10 R, 11 R.  The station 7 node was slightly enlarged as well. Using real-time ultrasound guidance Wang needle biopsies were take from Station 4R, 10 R, 11 R nodes and were sent for cytology. The patient tolerated the procedure well without apparent complications. There was no significant blood loss. The bronchoscope was withdrawn. Anesthesia was reversed and the patient was taken to the PACU for recovery.   Samples: 1. Wang needle biopsies from 4R node 2. Wang needle biopsies from 10 R node 3. Wang needle biopsies from 11 R node 4.  Endobronchial brushings right upper lobe airway 5.  Endobronchial biopsies right upper lobe airway  Plans:  The patient will be discharged from the PACU to home when recovered from anesthesia. We will review the cytology, pathology and microbiology results with the patient when they become available. Outpatient followup will be with Dr. Lamonte Sakai, Dr. Julien Nordmann, and Dr. Sondra Come.    Baltazar Apo, MD, PhD 09/21/2021, 8:44 AM [employer] [facility] [phone] or if no answer before 7:00PM call [phone] For any issues after 7:00PM please call eLink [phone]

## 2021-09-21 NOTE — Anesthesia Preprocedure Evaluation (Addendum)
Anesthesia Evaluation  Patient identified by MRN, date of birth, ID band Patient awake    Reviewed: Allergy & Precautions, NPO status , Patient's Chart, lab work & pertinent test results  Airway Mallampati: I  TM Distance: >3 FB Neck ROM: Full    Dental  (+) Missing, Poor Dentition, Dental Advisory Given, Chipped,    Pulmonary asthma , COPD,  COPD inhaler, Current Smoker,  Right hilar mass, lung cancer Current smoker, 15 pack year history  <1ppd   Pulmonary exam normal breath sounds clear to auscultation       Cardiovascular negative cardio ROS Normal cardiovascular exam Rhythm:Regular Rate:Normal     Neuro/Psych PSYCHIATRIC DISORDERS Anxiety Depression negative neurological ROS     GI/Hepatic negative GI ROS, (+)     substance abuse (hevay drinking on weekends)  ,   Endo/Other  negative endocrine ROS  Renal/GU negative Renal ROS  negative genitourinary   Musculoskeletal  (+) Arthritis , Osteoarthritis,    Abdominal   Peds negative pediatric ROS (+)  Hematology negative hematology ROS (+) hct 42.6   Anesthesia Other Findings Taking percocet for pain   Reproductive/Obstetrics negative OB ROS                            Anesthesia Physical Anesthesia Plan  ASA: 3  Anesthesia Plan: General   Post-op Pain Management:    Induction: Intravenous  PONV Risk Score and Plan: 2 and Ondansetron, Dexamethasone, Midazolam and Treatment may vary due to age or medical condition  Airway Management Planned: Oral ETT  Additional Equipment: None  Intra-op Plan:   Post-operative Plan: Extubation in OR  Informed Consent: I have reviewed the patients History and Physical, chart, labs and discussed the procedure including the risks, benefits and alternatives for the proposed anesthesia with the patient or authorized representative who has indicated his/her understanding and acceptance.      Dental advisory given  Plan Discussed with: CRNA  Anesthesia Plan Comments:         Anesthesia Quick Evaluation

## 2021-09-21 NOTE — H&P (Signed)
Sheila DEDE is an 50 y.o. female.   Chief Complaint: Right lung mass HPI: 50 year old woman with a history of tobacco use, COPD.  She was found to have a 7.6 x 4.5 cm right hilar mass, extending into the subcarinal and paratracheal regions when she was evaluated by CT chest 09/03/2021 for chest discomfort and cough. She has been seen by thoracic oncology, radiation oncology.  Tissue diagnosis has not yet been obtained.  She presents today for bronchoscopy, endobronchial ultrasound and biopsies. She has continued to have some mild chest discomfort, has cough.  She feels like she needs to clear secretions but is never able to get them out completely.  She denies any hemoptysis.  She has had some headache.  Past Medical History:  Diagnosis Date   Ankle sprain    left   Anxiety    treated for panic attacks in the past.   Asthma    COPD (chronic obstructive pulmonary disease) (HCC)    Depression    Irregular heart rate    Lung cancer (HCC)    Renal disorder    kidney infections    Past Surgical History:  Procedure Laterality Date   APPENDECTOMY     LUMBAR LAMINECTOMY N/A 05/13/2018   Procedure: LEFT LUMBAR FOUR-FIVE MICRODISCECTOMY;  Surgeon: Marybelle Killings, MD;  Location: [place];  Service: Orthopedics;  Laterality: N/A;   TUBAL LIGATION     TUBAL LIGATION  1998    Family History  Problem Relation Age of Onset   Hypertension Mother    Hypertension Father    Heart disease Father    Social History:  reports that she has been smoking cigarettes. She has a 15.00 pack-year smoking history. She has never used smokeless tobacco. She reports current alcohol use. She reports that she does not currently use drugs after having used the following drugs: Marijuana.  Allergies:  Allergies  Allergen Reactions   Naproxen Nausea Only   Bactrim [Sulfamethoxazole-Trimethoprim] Nausea Only    Medications Prior to Admission  Medication Sig Dispense Refill   albuterol (PROVENTIL HFA;VENTOLIN HFA)  108 (90 Base) MCG/ACT inhaler Inhale 2 puffs into the lungs every 6 (six) hours as needed for wheezing or shortness of breath. (Patient not taking: Reported on 09/15/2021) 1 Inhaler 2   ALPRAZolam (XANAX) 0.25 MG tablet Take 1 tablet (0.25 mg total) by mouth 3 (three) times daily. As needed for anxiety 30 tablet 0   oxyCODONE-acetaminophen (PERCOCET/ROXICET) 5-325 MG tablet Take 1 tablet by mouth every 4 (four) hours as needed for severe pain. 30 tablet 0   oxyCODONE-acetaminophen (PERCOCET/ROXICET) 5-325 MG tablet Take 1 tablet by mouth every 6 (six) hours as needed for severe pain. 15 tablet 0    No results found for this or any previous visit (from the past 48 hour(s)). No results found.  Review of Systems  Blood pressure 117/75, pulse 91, temperature 98.7 F (37.1 C), temperature source Oral, resp. rate 18, height 5\' 8"  (1.727 m), weight 75.8 kg, last menstrual period 04/24/2018, SpO2 97 %. Physical Exam  Gen: Pleasant, well-nourished, in no distress,  normal affect  ENT: No lesions,  mouth clear,  oropharynx clear, no postnasal drip  Neck: No JVD, no stridor  Lungs: No use of accessory muscles, coarse rhonchi bilaterally, more so on the right.  No wheezing  Cardiovascular: RRR, heart sounds normal, no murmur or gallops, no peripheral edema  Abdomen: soft and NT, no HSM,  BS normal  Musculoskeletal: No deformities, no cyanosis or clubbing  Neuro: alert, awake, non focal  Skin: Warm, no lesions or rash     Assessment/Plan Right hilar mass and patient with history of tobacco use, consistent with primary lung cancer. -Plan for bronchoscopy, endobronchial ultrasound with biopsies to obtain a tissue diagnosis. -Procedure discussed with the patient in full, risk, benefits reviewed, she understands and agrees with the plan to proceed.  Collene Gobble, MD 09/21/2021, 7:25 AM

## 2021-09-21 NOTE — Anesthesia Postprocedure Evaluation (Signed)
Anesthesia Post Note  Patient: [name]  Procedure(s) Performed: VIDEO BRONCHOSCOPY WITH ENDOBRONCHIAL ULTRASOUND BRONCHIAL NEEDLE ASPIRATION BIOPSIES BRONCHIAL BRUSHINGS BRONCHIAL BIOPSIES     Patient location during evaluation: PACU Anesthesia Type: General Level of consciousness: awake and alert, oriented and patient cooperative Pain management: pain level controlled Vital Signs Assessment: post-procedure vital signs reviewed and stable Respiratory status: spontaneous breathing, nonlabored ventilation and respiratory function stable Cardiovascular status: blood pressure returned to baseline and stable Postop Assessment: no apparent nausea or vomiting Anesthetic complications: no Comments: Pleuritic pain post-procedure, resolved w/ minimal narcotic    No notable events documented.  Last Vitals:  Vitals:   09/21/21 0930 09/21/21 0937  BP:  110/70  Pulse: 96 96  Resp: 13 14  Temp:    SpO2: 93% 93%    Last Pain:  Vitals:   09/21/21 0921  TempSrc:   PainSc: 0-No pain                 Pervis Hocking

## 2021-09-21 NOTE — Transfer of Care (Signed)
Immediate Anesthesia Transfer of Care Note  Patient: [name]  Procedure(s) Performed: VIDEO BRONCHOSCOPY WITH ENDOBRONCHIAL ULTRASOUND BRONCHIAL NEEDLE ASPIRATION BIOPSIES BRONCHIAL BRUSHINGS BRONCHIAL BIOPSIES  Patient Location: PACU  Anesthesia Type:General  Level of Consciousness: awake, alert  and oriented  Airway & Oxygen Therapy: Patient Spontanous Breathing and Patient connected to nasal cannula oxygen  Post-op Assessment: Report given to RN and Post -op Vital signs reviewed and stable.  Post vital signs: Reviewed and stable  Last Vitals:  Vitals Value Taken Time  BP 112/76 09/21/21 0854  Temp    Pulse 93 09/21/21 0858  Resp 23 09/21/21 0858  SpO2 100 % 09/21/21 0858  Vitals shown include unvalidated device data.  Last Pain:  Vitals:   09/21/21 0608  TempSrc: Oral  PainSc:       Patients Stated Pain Goal: 0 (49/32/41 9914)  Complications: Pt reporting pain on right side, shortness of breath, and feeling sick. Albuterol and Zofran given and Dr. Doroteo Glassman notified.

## 2021-09-21 NOTE — Discharge Instructions (Signed)
Flexible Bronchoscopy, Care After This sheet gives you information about how to care for yourself after your test. Your doctor may also give you more specific instructions. If you have problems or questions, contact your doctor. Follow these instructions at home: Eating and drinking Do not eat or drink anything (not even water) for 2 hours after your test, or until your numbing medicine (local anesthetic) wears off. When your numbness is gone and your cough and gag reflexes have come back, you may: Eat only soft foods. Slowly drink liquids. The day after the test, go back to your normal diet. Driving Do not drive for 24 hours if you were given a medicine to help you relax (sedative). Do not drive or use heavy machinery while taking prescription pain medicine. General instructions  Take over-the-counter and prescription medicines only as told by your doctor. Return to your normal activities as told. Ask what activities are safe for you. Do not use any products that have nicotine or tobacco in them. This includes cigarettes and e-cigarettes. If you need help quitting, ask your doctor. Keep all follow-up visits as told by your doctor. This is important. It is very important if you had a tissue sample (biopsy) taken. Get help right away if: You have shortness of breath that gets worse. You get light-headed. You feel like you are going to pass out (faint). You have chest pain. You cough up: More than a little blood. More blood than before. Summary Do not eat or drink anything (not even water) for 2 hours after your test, or until your numbing medicine wears off. Do not use cigarettes. Do not use e-cigarettes. Get help right away if you have chest pain.  Please call our office for any questions or concerns.  [phone].  This information is not intended to replace advice given to you by your health care provider. Make sure you discuss any questions you have with your health care  provider. Document Released: 09/08/2009 Document Revised: 10/24/2017 Document Reviewed: 11/29/2016 Elsevier Patient Education  2020 [employer].

## 2021-09-24 ENCOUNTER — Ambulatory Visit: Payer: Medicaid Other

## 2021-09-24 ENCOUNTER — Encounter (HOSPITAL_COMMUNITY): Payer: Self-pay | Admitting: Emergency Medicine

## 2021-09-24 ENCOUNTER — Telehealth: Payer: Self-pay | Admitting: *Deleted

## 2021-09-24 ENCOUNTER — Ambulatory Visit: Payer: Medicaid Other | Admitting: Radiation Oncology

## 2021-09-24 NOTE — Telephone Encounter (Signed)
I followed up on [name]'s PET scan. I reached out to radiology and was able to get it scheduled. I called [name] with an update on her scan appt and pre-procedure instructions.  I will updated Rad Onc Data processing manager and let her know that the patient had bx with pending results.  Patient verbalized understanding of appt and instructions.

## 2021-09-25 ENCOUNTER — Ambulatory Visit: Payer: Medicaid Other

## 2021-09-25 ENCOUNTER — Other Ambulatory Visit (HOSPITAL_COMMUNITY): Payer: Medicaid Other

## 2021-09-25 ENCOUNTER — Encounter: Payer: Self-pay | Admitting: *Deleted

## 2021-09-25 NOTE — Progress Notes (Signed)
[place] CSW Psychosocial Distress Screening   Social Work was referred by distress screening protocol.  The patient scored a 10 on the Psychosocial Distress Thermometer which indicates moderate distress. Social Work Intern contacted patient by phone to assess for distress and other psychosocial needs, left messages on 10/18 & 09/12/21, and reached pt on 09/25/21.  Patient reported that her anxiety has decreased and she is feeling more hopeful. She is still fearful of the unknown and has shed some tears, but is ready to start radiation treatment and says "I haven't given up yet". Pt demonstrated a sense of humor and ability for insight during the call. She said she walked in the park today with her youngest daughter and felt gratitude for nature and family.  Intern and patient discussed common feelings and emotions when being diagnosed with cancer, and the importance of support during treatment. Pt denied any resource/ financial needs at this time. Intern informed patient of the support team roles and support services at [facility], provided CSW contact information and encouraged patient to call with any questions or concerns.   ONCBCN DISTRESS SCREENING 09/10/2021  Screening Type Initial Screening  Distress experienced in past week (1-10) 10  Practical problem type Work/school  Emotional problem type Depression;Nervousness/Anxiety;Adjusting to illness;Feeling hopeless  Spiritual/Religous concerns type Loss of sense of purpose;Relating to God  Information Concerns Type Lack of info about diagnosis;Lack of info about treatment  Physical Problem type Pain;Breathing  Referral to clinical social work Yes    Rosary Lively, Social Work Intern Supervised by Gwinda Maine, LCSW

## 2021-09-26 ENCOUNTER — Ambulatory Visit: Payer: Medicaid Other

## 2021-09-26 ENCOUNTER — Other Ambulatory Visit: Payer: Self-pay

## 2021-09-26 ENCOUNTER — Telehealth: Payer: Self-pay | Admitting: Emergency Medicine

## 2021-09-26 ENCOUNTER — Ambulatory Visit
Admission: RE | Admit: 2021-09-26 | Discharge: 2021-09-26 | Disposition: A | Discharge status: Home / Self Care | Payer: Medicaid Other | Source: Ambulatory Visit | Attending: Radiation Oncology | Admitting: Radiation Oncology

## 2021-09-26 DIAGNOSIS — C3411 Malignant neoplasm of upper lobe, right bronchus or lung: Secondary | ICD-10-CM | POA: Insufficient documentation

## 2021-09-26 LAB — CYTOLOGY - NON PAP

## 2021-09-26 MED ORDER — OXYCODONE-ACETAMINOPHEN 5-325 MG PO TABS: 1.0000 | ORAL_TABLET | ORAL | 0 refills | Status: DC | PRN

## 2021-09-26 NOTE — Telephone Encounter (Signed)
Spoke with the patient and reviewed path results >> [facility]. She is seeing Rad Onc and Med Onc.

## 2021-09-26 NOTE — Progress Notes (Signed)
Pt here for patient teaching.    Pt given Radiation and You booklet, skin care instructions, and Sonafine.    Reviewed areas of pertinence such as fatigue, hair loss, nausea and vomiting, skin changes, cough, and shortness of breath .   Pt able to give teach back of to pat skin and use unscented/gentle soap,apply Sonafine bid and avoid applying anything to skin within 4 hours of treatment.   Pt verbalizes understanding of information given and will contact nursing with any questions or concerns.    [email or web address]

## 2021-09-27 ENCOUNTER — Telehealth: Payer: Self-pay | Admitting: *Deleted

## 2021-09-27 ENCOUNTER — Inpatient Hospital Stay (HOSPITAL_BASED_OUTPATIENT_CLINIC_OR_DEPARTMENT_OTHER): Payer: Medicaid Other | Admitting: Internal Medicine

## 2021-09-27 ENCOUNTER — Other Ambulatory Visit: Payer: Self-pay | Admitting: Medical Oncology

## 2021-09-27 ENCOUNTER — Inpatient Hospital Stay: Payer: Medicaid Other

## 2021-09-27 ENCOUNTER — Encounter: Payer: Self-pay | Admitting: *Deleted

## 2021-09-27 ENCOUNTER — Ambulatory Visit
Admission: RE | Admit: 2021-09-27 | Discharge: 2021-09-27 | Disposition: A | Discharge status: Home / Self Care | Payer: Medicaid Other | Source: Ambulatory Visit | Attending: Radiation Oncology | Admitting: Radiation Oncology

## 2021-09-27 VITALS — BP 120/92 | HR 118 | Temp 97.4°F | Resp 20 | Ht 68.0 in | Wt 173.6 lb

## 2021-09-27 DIAGNOSIS — J029 Acute pharyngitis, unspecified: Secondary | ICD-10-CM | POA: Insufficient documentation

## 2021-09-27 DIAGNOSIS — Z79899 Other long term (current) drug therapy: Secondary | ICD-10-CM | POA: Insufficient documentation

## 2021-09-27 DIAGNOSIS — R590 Localized enlarged lymph nodes: Secondary | ICD-10-CM | POA: Insufficient documentation

## 2021-09-27 DIAGNOSIS — Z9049 Acquired absence of other specified parts of digestive tract: Secondary | ICD-10-CM | POA: Insufficient documentation

## 2021-09-27 DIAGNOSIS — Z881 Allergy status to other antibiotic agents status: Secondary | ICD-10-CM | POA: Insufficient documentation

## 2021-09-27 DIAGNOSIS — I700 Atherosclerosis of aorta: Secondary | ICD-10-CM | POA: Insufficient documentation

## 2021-09-27 DIAGNOSIS — R5383 Other fatigue: Secondary | ICD-10-CM | POA: Insufficient documentation

## 2021-09-27 DIAGNOSIS — C7951 Secondary malignant neoplasm of bone: Secondary | ICD-10-CM | POA: Insufficient documentation

## 2021-09-27 DIAGNOSIS — Z886 Allergy status to analgesic agent status: Secondary | ICD-10-CM | POA: Insufficient documentation

## 2021-09-27 DIAGNOSIS — R112 Nausea with vomiting, unspecified: Secondary | ICD-10-CM | POA: Insufficient documentation

## 2021-09-27 DIAGNOSIS — R630 Anorexia: Secondary | ICD-10-CM | POA: Insufficient documentation

## 2021-09-27 DIAGNOSIS — J439 Emphysema, unspecified: Secondary | ICD-10-CM | POA: Insufficient documentation

## 2021-09-27 DIAGNOSIS — C3411 Malignant neoplasm of upper lobe, right bronchus or lung: Secondary | ICD-10-CM

## 2021-09-27 DIAGNOSIS — R059 Cough, unspecified: Secondary | ICD-10-CM | POA: Insufficient documentation

## 2021-09-27 DIAGNOSIS — R079 Chest pain, unspecified: Secondary | ICD-10-CM | POA: Insufficient documentation

## 2021-09-27 DIAGNOSIS — R0602 Shortness of breath: Secondary | ICD-10-CM | POA: Insufficient documentation

## 2021-09-27 DIAGNOSIS — K209 Esophagitis, unspecified without bleeding: Secondary | ICD-10-CM | POA: Insufficient documentation

## 2021-09-27 DIAGNOSIS — R634 Abnormal weight loss: Secondary | ICD-10-CM | POA: Insufficient documentation

## 2021-09-27 DIAGNOSIS — R61 Generalized hyperhidrosis: Secondary | ICD-10-CM | POA: Insufficient documentation

## 2021-09-27 DIAGNOSIS — Z5111 Encounter for antineoplastic chemotherapy: Secondary | ICD-10-CM | POA: Insufficient documentation

## 2021-09-27 DIAGNOSIS — R0609 Other forms of dyspnea: Secondary | ICD-10-CM | POA: Insufficient documentation

## 2021-09-27 DIAGNOSIS — R1310 Dysphagia, unspecified: Secondary | ICD-10-CM | POA: Insufficient documentation

## 2021-09-27 LAB — CMP (CANCER CENTER ONLY)
ALT: 11 U/L (ref 0–44)
AST: 13 U/L — ABNORMAL LOW (ref 15–41)
Albumin: 4.1 g/dL (ref 3.5–5.0)
Alkaline Phosphatase: 77 U/L (ref 38–126)
Anion gap: 9 (ref 5–15)
BUN: 10 mg/dL (ref 6–20)
CO2: 22 mmol/L (ref 22–32)
Calcium: 9.9 mg/dL (ref 8.9–10.3)
Chloride: 108 mmol/L (ref 98–111)
Creatinine: 0.73 mg/dL (ref 0.44–1.00)
GFR, Estimated: >60 mL/min (ref >60)
Glucose, Bld: 88 mg/dL (ref 70–99)
Potassium: 3.8 mmol/L (ref 3.5–5.1)
Sodium: 139 mmol/L (ref 135–145)
Total Bilirubin: 0.7 mg/dL (ref 0.3–1.2)
Total Protein: 8.3 g/dL — ABNORMAL HIGH (ref 6.5–8.1)

## 2021-09-27 LAB — CBC WITH DIFFERENTIAL (CANCER CENTER ONLY)
Abs Immature Granulocytes: 0.04 10*3/uL (ref 0.00–0.07)
Basophils Absolute: 0 10*3/uL (ref 0.0–0.1)
Basophils Relative: 1 %
Eosinophils Absolute: 0.1 10*3/uL (ref 0.0–0.5)
Eosinophils Relative: 2 %
HCT: 43.6 % (ref 36.0–46.0)
Hemoglobin: 14.9 g/dL (ref 12.0–15.0)
Immature Granulocytes: 1 %
Lymphocytes Relative: 25 %
Lymphs Abs: 1.8 10*3/uL (ref 0.7–4.0)
MCH: 32.9 pg (ref 26.0–34.0)
MCHC: 34.2 g/dL (ref 30.0–36.0)
MCV: 96.2 fL (ref 80.0–100.0)
Monocytes Absolute: 0.5 10*3/uL (ref 0.1–1.0)
Monocytes Relative: 7 %
Neutro Abs: 4.7 10*3/uL (ref 1.7–7.7)
Neutrophils Relative %: 64 %
Platelet Count: 331 10*3/uL (ref 150–400)
RBC: 4.53 MIL/uL (ref 3.87–5.11)
RDW: 14.3 % (ref 11.5–15.5)
WBC Count: 7.3 10*3/uL (ref 4.0–10.5)
nRBC: 0 % (ref 0.0–0.2)

## 2021-09-27 NOTE — Telephone Encounter (Signed)
Per Dr. Julien Nordmann, I called patient and scheduled her to be seen tomorrow with Dr. Julien Nordmann.  She is aware of appt

## 2021-09-27 NOTE — Progress Notes (Signed)
Per Dr. 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  They will cancel the test.

## 2021-09-27 NOTE — Progress Notes (Signed)
[facility] [phone]   [phone]  OFFICE PROGRESS NOTE  [name], PA-C [place] [address]  DIAGNOSIS: Limited stage (T3, N2, M0) small cell lung cancer presented with right hilar/suprahilar mass with occlusion of the right upper lobe bronchus in addition to right paratracheal, right infra hilar and subcarinal lymphadenopathy as well as additional right upper lobe and right apical pulmonary nodules.  This was diagnosed in October 2022.  PRIOR THERAPY: None  CURRENT THERAPY: Systemic chemotherapy with cisplatin 80 Mg/M2 on day 1 and etoposide 100 Mg/M2 on days 1, 2 and 3 every 3 weeks.  First dose October 02, 2021.  This will be concurrent with radiotherapy under the care of Dr. Sondra Come.  INTERVAL HISTORY: [name] 50 y.o. female returns to the clinic today for follow-up visit accompanied by her mother.  The patient is feeling fine today but very anxious about her recent diagnosis with the small cell lung cancer.  She started concurrent radiotherapy 2 days ago and has been tolerating it fairly well.  She continues to have the chest pressure as well as cough and shortness of breath with exertion but no hemoptysis.  She denied having any fever or chills.  She has no nausea, vomiting, diarrhea or constipation.  She has no headache or visual changes.  She underwent video bronchoscopy with endobronchial ultrasound and biopsies under the care of Dr. Lamonte Sakai on 09/21/2021 and the final pathology was consistent with small cell lung cancer.  She is here today for evaluation and discussion of her treatment options based on the new findings.  She is scheduled for a PET scan on October 04, 2021.  MEDICAL HISTORY: Past Medical History:  Diagnosis Date   Ankle sprain    left   Anxiety    treated for panic attacks in the past.   Asthma    COPD (chronic obstructive pulmonary disease) (HCC)    Depression    Irregular heart  rate    Lung cancer (HCC)    Renal disorder    kidney infections    ALLERGIES:  is allergic to naproxen and bactrim [sulfamethoxazole-trimethoprim].  MEDICATIONS:  Current Outpatient Medications  Medication Sig Dispense Refill   albuterol (PROVENTIL HFA;VENTOLIN HFA) 108 (90 Base) MCG/ACT inhaler Inhale 2 puffs into the lungs every 6 (six) hours as needed for wheezing or shortness of breath. (Patient not taking: Reported on 09/15/2021) 1 Inhaler 2   ALPRAZolam (XANAX) 0.25 MG tablet Take 1 tablet (0.25 mg total) by mouth 3 (three) times daily. As needed for anxiety 30 tablet 0   oxyCODONE-acetaminophen (PERCOCET/ROXICET) 5-325 MG tablet Take 1 tablet by mouth every 6 (six) hours as needed for severe pain. 15 tablet 0   oxyCODONE-acetaminophen (PERCOCET/ROXICET) 5-325 MG tablet Take 1 tablet by mouth every 4 (four) hours as needed for severe pain. 30 tablet 0   No current facility-administered medications for this visit.    SURGICAL HISTORY:  Past Surgical History:  Procedure Laterality Date   APPENDECTOMY     BRONCHIAL BIOPSY  09/21/2021   Procedure: BRONCHIAL BIOPSIES;  Surgeon: Collene Gobble, MD;  Location: [facility] ENDOSCOPY;  Service: Pulmonary;;   BRONCHIAL BRUSHINGS  09/21/2021   Procedure: BRONCHIAL BRUSHINGS;  Surgeon: Collene Gobble, MD;  Location: [facility] ENDOSCOPY;  Service: Pulmonary;;   BRONCHIAL NEEDLE ASPIRATION BIOPSY  09/21/2021   Procedure: BRONCHIAL NEEDLE ASPIRATION BIOPSIES;  Surgeon: Collene Gobble, MD;  Location: [facility] ENDOSCOPY;  Service: Pulmonary;;   LUMBAR LAMINECTOMY N/A 05/13/2018   Procedure: LEFT LUMBAR FOUR-FIVE MICRODISCECTOMY;  Surgeon: Marybelle Killings, MD;  Location: [place];  Service: Orthopedics;  Laterality: N/A;   TUBAL LIGATION     TUBAL LIGATION  1998   VIDEO BRONCHOSCOPY WITH ENDOBRONCHIAL ULTRASOUND N/A 09/21/2021   Procedure: VIDEO BRONCHOSCOPY WITH ENDOBRONCHIAL ULTRASOUND;  Surgeon: Collene Gobble, MD;  Location: [place] ENDOSCOPY;  Service: Pulmonary;   Laterality: N/A;    REVIEW OF SYSTEMS:  Constitutional: positive for fatigue Eyes: negative Ears, nose, mouth, throat, and face: negative Respiratory: positive for cough, dyspnea on exertion, and pleurisy/chest pain Cardiovascular: negative Gastrointestinal: negative Genitourinary:negative Integument/breast: negative Hematologic/lymphatic: negative Musculoskeletal:negative Neurological: negative Behavioral/Psych: negative Endocrine: negative Allergic/Immunologic: negative   PHYSICAL EXAMINATION: General appearance: alert, cooperative, fatigued, and no distress Head: Normocephalic, without obvious abnormality, atraumatic Neck: no adenopathy, no JVD, supple, symmetrical, trachea midline, and thyroid not enlarged, symmetric, no tenderness/mass/nodules Lymph nodes: Cervical, supraclavicular, and axillary nodes normal. Resp: wheezes RUL Back: symmetric, no curvature. ROM normal. No CVA tenderness. Cardio: regular rate and rhythm, S1, S2 normal, no murmur, click, rub or gallop GI: soft, non-tender; bowel sounds normal; no masses,  no organomegaly Extremities: extremities normal, atraumatic, no cyanosis or edema Neurologic: Alert and oriented X 3, normal strength and tone. Normal symmetric reflexes. Normal coordination and gait  ECOG PERFORMANCE STATUS: 1 - Symptomatic but completely ambulatory  Blood pressure (!) 120/92, pulse (!) 118, temperature (!) 97.4 F (36.3 C), temperature source Tympanic, resp. rate 20, height 5\' 8"  (1.727 m), weight 173 lb 9.6 oz (78.7 kg), last menstrual period 04/24/2018, SpO2 96 %.  LABORATORY DATA: Lab Results  Component Value Date   WBC 7.3 09/27/2021   HGB 14.9 09/27/2021   HCT 43.6 09/27/2021   MCV 96.2 09/27/2021   PLT 331 09/27/2021      Chemistry      Component Value Date/Time   NA 139 09/27/2021 0929   NA 139 02/18/2017 0932   K 3.8 09/27/2021 0929   CL 108 09/27/2021 0929   CO2 22 09/27/2021 0929   BUN 10 09/27/2021 0929   BUN 11  02/18/2017 0932   CREATININE 0.73 09/27/2021 0929      Component Value Date/Time   CALCIUM 9.9 09/27/2021 0929   ALKPHOS 77 09/27/2021 0929   AST 13 (L) 09/27/2021 0929   ALT 11 09/27/2021 0929   BILITOT 0.7 09/27/2021 0929       RADIOGRAPHIC STUDIES: DG Chest 2 View  Result Date: 09/15/2021 CLINICAL DATA:  Shortness of breath. Recent diagnosis of lung cancer. Cough. Chest pain. EXAM: CHEST - 2 VIEW COMPARISON:  Chest CT 09/03/2021 FINDINGS: Normal heart size. Stable mediastinal contours with right suprahilar mass. Emphysema with chronic bronchial thickening. No new airspace disease. No pneumothorax or pleural effusion. No acute osseous abnormalities are seen. IMPRESSION: 1. Unchanged right suprahilar mass, recently assessed on chest CT. 2. No acute chest finding. Electronically Signed   By: Keith Rake M.D.   On: 09/15/2021 17:05   CT CHEST W CONTRAST  Result Date: 09/03/2021 CLINICAL DATA:  Right rib pain, possible right hilar mass. EXAM: CT CHEST WITH CONTRAST TECHNIQUE: Multidetector CT imaging of the chest was performed during intravenous contrast administration. CONTRAST:  48mL OMNIPAQUE IOHEXOL 300 MG/ML  SOLN COMPARISON:  September 03, 2015. FINDINGS: Cardiovascular: No significant vascular findings. Normal heart size. No pericardial effusion. Mediastinum/Nodes: Thyroid gland and esophagus are unremarkable. Large mass measuring 7.6 x 4.5 cm is seen extending from right hilar region  into precarinal region consistent with malignancy or metastatic disease, which appears to be severely compressing right upper lobe bronchus. 3.2 x 2.7 cm right paratracheal mass is noted as well. Lungs/Pleura: No pneumothorax or pleural effusion is noted. Emphysematous disease is noted bilaterally. Right upper lobe postobstructive atelectasis or pneumonia is noted. 1.2 cm rounded density is seen in the right upper lobe concerning for malignancy or metastatic disease. 1.4 x 1.1 cm posterior right apical  density is noted which may represent mass or scarring. Upper Abdomen: No acute abnormality. Musculoskeletal: No chest wall abnormality. No acute or significant osseous findings. IMPRESSION: 7.6 x 4.5 cm mass is seen extending from right hilar region into precarinal region consistent with malignancy or metastatic disease. It appears to be severely compressing the right upper lobe bronchus. 3.2 x 2.7 cm right paratracheal mass is also noted concerning for metastatic disease. There does appear to be some degree of right upper lobe postobstructive atelectasis or possibly pneumonia. 1.2 cm rounded density is seen more laterally in the right upper lobe concerning for malignancy or metastatic disease. 1.4 x 1.1 cm posterior right apical density is noted which may represent mass or scarring. Bronchoscopy is recommended for further evaluation. These results will be called to the ordering clinician or representative by the Radiologist Assistant, and communication documented in the PACS or zVision Dashboard. Emphysema (ICD10-J43.9). Electronically Signed   By: Marijo Conception M.D.   On: 09/03/2021 16:32   CT Angio Chest PE W/Cm &/Or Wo Cm  Result Date: 09/15/2021 CLINICAL DATA:  PE suspected, high prob Shortness of breath.  Recent diagnosis of lung cancer. EXAM: CT ANGIOGRAPHY CHEST WITH CONTRAST TECHNIQUE: Multidetector CT imaging of the chest was performed using the standard protocol during bolus administration of intravenous contrast. Multiplanar CT image reconstructions and MIPs were obtained to evaluate the vascular anatomy. CONTRAST:  71mL OMNIPAQUE IOHEXOL 350 MG/ML SOLN COMPARISON:  Radiograph earlier today. Chest CT 09/03/2021, 10 days ago FINDINGS: Cardiovascular: There are no filling defects within the pulmonary arteries to suggest pulmonary embolus. The right upper lobe pulmonary artery is attenuated and encased by right suprahilar mass, occluded distally, but no evidence of intraluminal thrombus. Mild aortic  atherosclerosis without aneurysm. Heart is normal in size. No pericardial effusion or thickening. Mediastinum/Nodes: Presumed central right upper lobe mass is contiguous with the right hilum and mediastinal adenopathy. It is difficult to accurately assess the size of the mass due to confluent adjacent lymph nodes, however mass and nodal conglomerate span at least 6.5 x 4.7 cm, series 5, image 118. This previously measured 6.1 x 4.6 cm on my retrospective measurement. This causes complete occlusion of the right upper lobe bronchus. There is right paratracheal adenopathy with lymph node measuring 2.5 cm short axis, series 4, image 41. There is a prominent subcarinal node. Enlarged right infrahilar nodes measure up to 16 mm, series 4, image 87. 9 mm high paratracheal node series 4, image 29. There is no contralateral left hilar adenopathy. Unremarkable CT appearance of the thyroid gland and esophagus. Lungs/Pleura: Right upper lobe suprahilar mass which is contiguous with adenopathy, as described above. Borders are irregular with multiple satellite nodules and ground-glass extending towards the apex. There is a separate subpleural nodule in the right upper lobe measuring 1.4 x 1.9 cm unchanged by my retrospective measurement, series 6, image 34. 5 mm subpleural right apical nodule, series 6, image 38. Paramediastinal atelectasis in the right upper lobe, likely postobstructive. No pulmonary nodules in the left lung. Moderate emphysema. No pleural  fluid. Upper Abdomen: No adrenal nodule. No visualized liver lesion. No acute findings in the included upper abdomen. Musculoskeletal: No destructive lytic lesion. No blastic osseous lesion. No acute osseous abnormalities are seen. No chest wall soft tissue abnormality. Review of the MIP images confirms the above findings. IMPRESSION: 1. No pulmonary embolus. 2. Right hilar/suprahilar mass which is contiguous with adenopathy consistent with malignancy. This causes complete  occlusion of the right upper lobe bronchus. The right upper lobe pulmonary artery is attenuated and encased by the right suprahilar mass, but no evidence of intraluminal thrombus. 3. Right paratracheal, right infrahilar, and subcarinal adenopathy. 4. Additional right upper lobe and right apical pulmonary nodules. 5. No definite acute findings or change from recent chest CT. 6. Moderate emphysema. Aortic Atherosclerosis (ICD10-I70.0) and Emphysema (ICD10-J43.9). Electronically Signed   By: Keith Rake M.D.   On: 09/15/2021 20:17   MR Brain W Wo Contrast  Result Date: 09/20/2021 CLINICAL DATA:  50 year old female with right hilar lung mass on recent CTs this month. Staging lung cancer. EXAM: MRI HEAD WITHOUT AND WITH CONTRAST TECHNIQUE: Multiplanar, multiecho pulse sequences of the brain and surrounding structures were obtained without and with intravenous contrast. CONTRAST:  60mL GADAVIST GADOBUTROL 1 MMOL/ML IV SOLN COMPARISON:  CTA chest 09/15/2021 FINDINGS: Brain: No midline shift, mass effect, or evidence of intracranial mass lesion. No abnormal enhancement identified. No dural thickening identified. Normal cerebral volume. No restricted diffusion to suggest acute infarction. No ventriculomegaly, extra-axial collection or acute intracranial hemorrhage. Cervicomedullary junction and pituitary are within normal limits. There is moderate patchy T2 and FLAIR hyperintensity in the pons, but no associated enhancement or mass effect. No chronic cerebral blood products. Elsewhere gray and white matter signal is within normal limits for age. No cortical encephalomalacia identified. Deep gray nuclei and cerebellum are within normal limits. Vascular: Major intracranial vascular flow voids are preserved. The right vertebral artery appears dominant. The major dural venous sinuses are enhancing and appear to be patent. Skull and upper cervical spine: Negative visible cervical spine and spinal cord. Visualized bone  marrow signal is within normal limits. Sinuses/Orbits: Negative orbits. Paranasal sinuses are clear aside from subtle mucosal thickening or a tiny retention cyst in the right maxillary alveolar recess. Other: Mastoids are clear. Visible internal auditory structures appear normal. Visible scalp and face soft tissues appear grossly negative. IMPRESSION: 1. No metastatic disease or acute intracranial abnormality. 2. Moderate patchy signal changes in the pons, probably due to chronic small vessel disease. But otherwise normal for age MRI appearance of the brain. Electronically Signed   By: Genevie Ann M.D.   On: 09/20/2021 08:53   DG CHEST PORT 1 VIEW  Result Date: 09/21/2021 CLINICAL DATA:  50 year old female with history of shortness of breath following bronchoscopy. EXAM: PORTABLE CHEST 1 VIEW COMPARISON:  Chest x-ray 09/15/2021. FINDINGS: Large right upper lobe mass again noted, better demonstrated on recent chest CT 09/15/2021. Fullness in the right paratracheal region reflects underlying malignant lymphadenopathy. Mild emphysematous changes. Patchy areas of interstitial prominence throughout the mid to lower lungs bilaterally where there is also diffuse peribronchial cuffing, similar to the prior study. No new acute consolidative airspace disease. No pneumothorax. No evidence of pulmonary edema. Heart size is normal. IMPRESSION: 1. Stable radiographic appearance of the chest with no pneumothorax or other acute complicating features following recent bronchoscopy. Electronically Signed   By: Vinnie Langton M.D.   On: 09/21/2021 09:17    ASSESSMENT AND PLAN: This is a very pleasant 50 years old white  female recently diagnosed with limited stage (T3, N2, MX) small cell lung cancer presented with right hilar/suprahilar mass with occlusion of the right upper lobe bronchus in addition to right paratracheal, right infrahilar and subcarinal lymphadenopathy as well as right upper lobe and right apical pulmonary nodules  diagnosed in October 2022. The patient had MRI of the brain performed recently that showed no evidence of metastatic disease to the brain.  Her PET scan is scheduled for October 04, 2021. I had a lengthy discussion with the patient and her mother today about her current condition and treatment options. I recommended for the patient treatment with systemic chemotherapy with cisplatin 80 Mg/M2 on day 1 and 2 etoposide 100 Mg/M2 on days 1, 2 and 3 every 3 weeks for 4 cycles concurrent with radiotherapy for 6 weeks during this course. If the PET scan showed any concerning findings for metastatic disease, I may consider changing her treatment to carboplatin, etoposide, Cosela and Imfinzi starting from cycle #2 I discussed with the patient the adverse effect of this treatment including but not limited to alopecia, myelosuppression, nausea and vomiting, peripheral neuropathy, liver or renal dysfunction. The patient is expected to start the first cycle of this treatment on October 02, 2021. I will arrange for the patient to have a chemotherapy education class before the first dose of her treatment. I will call her pharmacy with prescription for Compazine 10 mg p.o. every 6 hours as needed for nausea. The patient will come back for follow-up visit in 2 weeks for evaluation and management of any adverse effect of her treatment. She was advised to call immediately if she has any other concerning symptoms in the interval. The patient voices understanding of current disease status and treatment options and is in agreement with the current care plan.  All questions were answered. The patient knows to call the clinic with any problems, questions or concerns. We can certainly see the patient much sooner if necessary.  The total time spent in the appointment was 35 minutes.  Disclaimer: This note was dictated with voice recognition software. Similar sounding words can inadvertently be transcribed and may not be  corrected upon review.

## 2021-09-27 NOTE — Progress Notes (Signed)
START ON PATHWAY REGIMEN - Small Cell Lung     A cycle is every 21 days:     Etoposide      Cisplatin   **Always confirm dose/schedule in your pharmacy ordering system**  Patient Characteristics: Newly Diagnosed, Preoperative or Nonsurgical Candidate (Clinical Staging), First Line, Limited Stage, Nonsurgical Candidate Therapeutic Status: Newly Diagnosed, Preoperative or Nonsurgical Candidate (Clinical Staging) AJCC T Category: cT3 AJCC N Category: cN2 AJCC M Category: cM0 AJCC 8 Stage Grouping: IIIB Stage Classification: Limited Surgical Candidacy: Nonsurgical Candidate Intent of Therapy: Curative Intent, Discussed with Patient

## 2021-09-28 ENCOUNTER — Encounter: Payer: Self-pay | Admitting: Internal Medicine

## 2021-09-28 ENCOUNTER — Ambulatory Visit
Admission: RE | Admit: 2021-09-28 | Discharge: 2021-09-28 | Disposition: A | Discharge status: Home / Self Care | Payer: Medicaid Other | Source: Ambulatory Visit | Attending: Radiation Oncology | Admitting: Radiation Oncology

## 2021-09-28 ENCOUNTER — Ambulatory Visit: Payer: Medicaid Other | Admitting: Internal Medicine

## 2021-09-28 ENCOUNTER — Other Ambulatory Visit: Payer: Self-pay

## 2021-09-28 DIAGNOSIS — Z7189 Other specified counseling: Secondary | ICD-10-CM | POA: Insufficient documentation

## 2021-09-28 DIAGNOSIS — Z5111 Encounter for antineoplastic chemotherapy: Secondary | ICD-10-CM | POA: Insufficient documentation

## 2021-09-28 DIAGNOSIS — C3411 Malignant neoplasm of upper lobe, right bronchus or lung: Secondary | ICD-10-CM | POA: Diagnosis not present

## 2021-09-29 ENCOUNTER — Ambulatory Visit: Payer: Medicaid Other

## 2021-10-01 ENCOUNTER — Ambulatory Visit
Admission: RE | Admit: 2021-10-01 | Discharge: 2021-10-01 | Disposition: A | Discharge status: Home / Self Care | Payer: Medicaid Other | Source: Ambulatory Visit | Attending: Radiation Oncology | Admitting: Radiation Oncology

## 2021-10-01 ENCOUNTER — Other Ambulatory Visit: Payer: Self-pay

## 2021-10-01 DIAGNOSIS — C3411 Malignant neoplasm of upper lobe, right bronchus or lung: Secondary | ICD-10-CM | POA: Diagnosis not present

## 2021-10-01 MED FILL — Dexamethasone Sodium Phosphate Inj 100 MG/10ML: INTRAMUSCULAR | Qty: 1 | Status: AC

## 2021-10-01 MED FILL — Fosaprepitant Dimeglumine For IV Infusion 150 MG (Base Eq): INTRAVENOUS | Qty: 5 | Status: AC

## 2021-10-02 ENCOUNTER — Encounter: Payer: Self-pay | Admitting: Internal Medicine

## 2021-10-02 ENCOUNTER — Ambulatory Visit: Payer: Medicaid Other | Admitting: Internal Medicine

## 2021-10-02 ENCOUNTER — Inpatient Hospital Stay: Payer: Medicaid Other

## 2021-10-02 ENCOUNTER — Other Ambulatory Visit (HOSPITAL_COMMUNITY): Payer: Self-pay

## 2021-10-02 ENCOUNTER — Ambulatory Visit
Admission: RE | Admit: 2021-10-02 | Discharge: 2021-10-02 | Disposition: A | Discharge status: Home / Self Care | Payer: Medicaid Other | Source: Ambulatory Visit | Attending: Radiation Oncology | Admitting: Radiation Oncology

## 2021-10-02 ENCOUNTER — Other Ambulatory Visit: Payer: Medicaid Other

## 2021-10-02 ENCOUNTER — Other Ambulatory Visit: Payer: Self-pay

## 2021-10-02 DIAGNOSIS — C3411 Malignant neoplasm of upper lobe, right bronchus or lung: Secondary | ICD-10-CM

## 2021-10-02 LAB — CMP (CANCER CENTER ONLY)
ALT: 16 U/L (ref 0–44)
AST: 18 U/L (ref 15–41)
Albumin: 4 g/dL (ref 3.5–5.0)
Alkaline Phosphatase: 77 U/L (ref 38–126)
Anion gap: 9 (ref 5–15)
BUN: 9 mg/dL (ref 6–20)
CO2: 24 mmol/L (ref 22–32)
Calcium: 10.1 mg/dL (ref 8.9–10.3)
Chloride: 105 mmol/L (ref 98–111)
Creatinine: 0.67 mg/dL (ref 0.44–1.00)
GFR, Estimated: >60 mL/min (ref >60)
Glucose, Bld: 98 mg/dL (ref 70–99)
Potassium: 3.7 mmol/L (ref 3.5–5.1)
Sodium: 138 mmol/L (ref 135–145)
Total Bilirubin: 0.6 mg/dL (ref 0.3–1.2)
Total Protein: 8.3 g/dL — ABNORMAL HIGH (ref 6.5–8.1)

## 2021-10-02 LAB — CBC WITH DIFFERENTIAL (CANCER CENTER ONLY)
Abs Immature Granulocytes: 0.03 10*3/uL (ref 0.00–0.07)
Basophils Absolute: 0 10*3/uL (ref 0.0–0.1)
Basophils Relative: 0 %
Eosinophils Absolute: 0.1 10*3/uL (ref 0.0–0.5)
Eosinophils Relative: 1 %
HCT: 42.5 % (ref 36.0–46.0)
Hemoglobin: 14.5 g/dL (ref 12.0–15.0)
Immature Granulocytes: 1 %
Lymphocytes Relative: 22 %
Lymphs Abs: 1.4 10*3/uL (ref 0.7–4.0)
MCH: 32.7 pg (ref 26.0–34.0)
MCHC: 34.1 g/dL (ref 30.0–36.0)
MCV: 95.9 fL (ref 80.0–100.0)
Monocytes Absolute: 0.6 10*3/uL (ref 0.1–1.0)
Monocytes Relative: 9 %
Neutro Abs: 4.3 10*3/uL (ref 1.7–7.7)
Neutrophils Relative %: 67 %
Platelet Count: 284 10*3/uL (ref 150–400)
RBC: 4.43 MIL/uL (ref 3.87–5.11)
RDW: 14 % (ref 11.5–15.5)
WBC Count: 6.2 10*3/uL (ref 4.0–10.5)
nRBC: 0 % (ref 0.0–0.2)

## 2021-10-02 LAB — MAGNESIUM: Magnesium: 1.6 mg/dL — ABNORMAL LOW (ref 1.7–2.4)

## 2021-10-02 MED ORDER — PROCHLORPERAZINE MALEATE 10 MG PO TABS
10.0000 mg | ORAL_TABLET | Freq: Four times a day (QID) | ORAL | 1 refills | Status: DC | PRN
  Filled 2021-10-02: qty 30, 8d supply, fill #0

## 2021-10-02 MED FILL — Dexamethasone Sodium Phosphate Inj 100 MG/10ML: INTRAMUSCULAR | Qty: 1 | Status: AC

## 2021-10-02 NOTE — Progress Notes (Signed)
Met w/ pt to introduce myself as her Arboriculturist.  Pt's ins should pay for her treatment at 100% so copay assistance shouldn't be needed.  I informed her of the [employer], went over what it covers and gave her an expense sheet.  Pt would like to apply and since she's not receiving any income right now she provided a letter of support and was approved for the $1000 grant.

## 2021-10-03 ENCOUNTER — Ambulatory Visit
Admission: RE | Admit: 2021-10-03 | Discharge: 2021-10-03 | Disposition: A | Discharge status: Home / Self Care | Payer: Medicaid Other | Source: Ambulatory Visit | Attending: Radiation Oncology | Admitting: Radiation Oncology

## 2021-10-03 ENCOUNTER — Other Ambulatory Visit (HOSPITAL_COMMUNITY): Payer: Self-pay

## 2021-10-03 ENCOUNTER — Other Ambulatory Visit: Payer: Self-pay | Admitting: Internal Medicine

## 2021-10-03 ENCOUNTER — Inpatient Hospital Stay: Payer: Medicaid Other

## 2021-10-03 ENCOUNTER — Encounter: Payer: Self-pay | Admitting: Internal Medicine

## 2021-10-03 VITALS — BP 125/87 | HR 111 | Temp 98.3°F | Resp 24

## 2021-10-03 DIAGNOSIS — C3411 Malignant neoplasm of upper lobe, right bronchus or lung: Secondary | ICD-10-CM | POA: Diagnosis not present

## 2021-10-03 MED ORDER — OXYCODONE-ACETAMINOPHEN 5-325 MG PO TABS
1.0000 | ORAL_TABLET | Freq: Three times a day (TID) | ORAL | 0 refills | Status: DC | PRN
  Filled 2021-10-03: qty 30, 10d supply, fill #0

## 2021-10-03 MED ORDER — SODIUM CHLORIDE 0.9 % IV SOLN
10.0000 mg | Freq: Once | INTRAVENOUS | Status: AC
  Administered 2021-10-03: 10 mg via INTRAVENOUS
  Filled 2021-10-03: qty 10

## 2021-10-03 MED ORDER — ACETAMINOPHEN 325 MG PO TABS
325.0000 mg | ORAL_TABLET | Freq: Once | ORAL | Status: AC
  Administered 2021-10-03: 325 mg via ORAL
  Filled 2021-10-03: qty 1

## 2021-10-03 MED ORDER — SODIUM CHLORIDE 0.9 % IV SOLN
150.0000 mg | Freq: Once | INTRAVENOUS | Status: DC
  Filled 2021-10-03: qty 5

## 2021-10-03 MED ORDER — PALONOSETRON HCL INJECTION 0.25 MG/5ML
0.2500 mg | Freq: Once | INTRAVENOUS | Status: AC
  Administered 2021-10-03: 0.25 mg via INTRAVENOUS
  Filled 2021-10-03: qty 5

## 2021-10-03 MED ORDER — OXYCODONE HCL 5 MG PO TABS
5.0000 mg | ORAL_TABLET | Freq: Once | ORAL | Status: AC
  Administered 2021-10-03: 5 mg via ORAL
  Filled 2021-10-03: qty 1

## 2021-10-03 MED ORDER — OXYCODONE-ACETAMINOPHEN 5-325 MG PO TABS
1.0000 | ORAL_TABLET | Freq: Three times a day (TID) | ORAL | 0 refills | Status: DC | PRN
Start: 2021-10-03 — End: 2021-10-03

## 2021-10-03 MED ORDER — SODIUM CHLORIDE 0.9 % IV SOLN
150.0000 mg | Freq: Once | INTRAVENOUS | Status: AC
  Administered 2021-10-03: 150 mg via INTRAVENOUS
  Filled 2021-10-03: qty 150

## 2021-10-03 MED ORDER — SODIUM CHLORIDE 0.9 % IV SOLN
100.0000 mg/m2 | Freq: Once | INTRAVENOUS | Status: AC
  Administered 2021-10-03: 190 mg via INTRAVENOUS
  Filled 2021-10-03: qty 9.5

## 2021-10-03 MED ORDER — SODIUM CHLORIDE 0.9 % IV SOLN
10.0000 mg | Freq: Once | INTRAVENOUS | Status: DC
  Filled 2021-10-03: qty 1

## 2021-10-03 MED ORDER — SODIUM CHLORIDE 0.9 % IV SOLN
80.0000 mg/m2 | Freq: Once | INTRAVENOUS | Status: AC
  Administered 2021-10-03: 155 mg via INTRAVENOUS
  Filled 2021-10-03: qty 155

## 2021-10-03 MED ORDER — SODIUM CHLORIDE 0.9 % IV SOLN: Freq: Once | INTRAVENOUS | Status: AC

## 2021-10-03 MED ORDER — POTASSIUM CHLORIDE IN NACL 20-0.9 MEQ/L-% IV SOLN
Freq: Once | INTRAVENOUS | Status: AC
  Filled 2021-10-03: qty 1000

## 2021-10-03 MED ORDER — MAGNESIUM SULFATE 2 GM/50ML IV SOLN
2.0000 g | Freq: Once | INTRAVENOUS | Status: AC
  Administered 2021-10-03: 2 g via INTRAVENOUS
  Filled 2021-10-03: qty 50

## 2021-10-03 NOTE — Progress Notes (Signed)
Per Dr. Julien Nordmann ok to treat with tachycardia today.

## 2021-10-03 NOTE — Patient Instructions (Signed)
[facility] ONCOLOG[facility]  Discharge Instructions: Thank you for choosing [facility] to provide your oncology and hematology care.   If you have a lab appointment with the [facility], please go directly to the [facility] and check in at the registration area.   Wear comfortable clothing and clothing appropriate for easy access to any Portacath or PICC line.   We strive to give you quality time with your provider. You may need to reschedule your appointment if you arrive late (15 or more minutes).  Arriving late affects you and other patients whose appointments are after yours.  Also, if you miss three or more appointments without notifying the office, you may be dismissed from the clinic at the provider's discretion.      For prescription refill requests, have your pharmacy contact our office and allow 72 hours for refills to be completed.    Today you received the following chemotherapy and/or immunotherapy agents Cisplatin & Etoposide      To help prevent nausea and vomiting after your treatment, we encourage you to take your nausea medication as directed.  BELOW ARE SYMPTOMS THAT SHOULD BE REPORTED IMMEDIATELY: *FEVER GREATER THAN 100.4 F (38 C) OR HIGHER *CHILLS OR SWEATING *NAUSEA AND VOMITING THAT IS NOT CONTROLLED WITH YOUR NAUSEA MEDICATION *UNUSUAL SHORTNESS OF BREATH *UNUSUAL BRUISING OR BLEEDING *URINARY PROBLEMS (pain or burning when urinating, or frequent urination) *BOWEL PROBLEMS (unusual diarrhea, constipation, pain near the anus) TENDERNESS IN MOUTH AND THROAT WITH OR WITHOUT PRESENCE OF ULCERS (sore throat, sores in mouth, or a toothache) UNUSUAL RASH, SWELLING OR PAIN  UNUSUAL VAGINAL DISCHARGE OR ITCHING   Items with * indicate a potential emergency and should be followed up as soon as possible or go to the Emergency Department if any problems should occur.  Please show the CHEMOTHERAPY ALERT CARD or IMMUNOTHERAPY ALERT CARD at  check-in to the Emergency Department and triage nurse.  Should you have questions after your visit or need to cancel or reschedule your appointment, please contact [facility]  Dept: [phone]  and follow the prompts.  Office hours are 8:00 a.m. to 4:30 p.m. Monday - Friday. Please note that voicemails left after 4:00 p.m. may not be returned until the following business day.  We are closed weekends and major holidays. You have access to a nurse at all times for urgent questions. Please call the main number to the clinic Dept: [phone] and follow the prompts.   For any non-urgent questions, you may also contact your provider using MyChart. We now offer e-Visits for anyone 65 and older to request care online for non-urgent symptoms. For details visit [email or web address].   Also download the MyChart app! Go to the app store, search "MyChart", open the app, select [facility], and log in with your MyChart username and password.  Due to Covid, a mask is required upon entering the hospital/clinic. If you do not have a mask, one will be given to you upon arrival. For doctor visits, patients may have 1 support person aged 14 or older with them. For treatment visits, patients cannot have anyone with them due to current Covid guidelines and our immunocompromised population.   Cisplatin injection What is this medication? CISPLATIN (SIS pla tin) is a chemotherapy drug. It targets fast dividing cells, like cancer cells, and causes these cells to die. This medicine is used to treat many types of cancer like bladder, ovarian, and testicular cancers. This medicine may  be used for other purposes; ask your health care provider or pharmacist if you have questions. COMMON BRAND NAME(S): Platinol, Platinol -AQ What should I tell my care team before I take this medication? They need to know if you have any of these conditions: eye disease, vision problems hearing  problems kidney disease low blood counts, like white cells, platelets, or red blood cells tingling of the fingers or toes, or other nerve disorder an unusual or allergic reaction to cisplatin, carboplatin, oxaliplatin, other medicines, foods, dyes, or preservatives pregnant or trying to get pregnant breast-feeding How should I use this medication? This drug is given as an infusion into a vein. It is administered in a hospital or clinic by a specially trained health care professional. Talk to your pediatrician regarding the use of this medicine in children. Special care may be needed. Overdosage: If you think you have taken too much of this medicine contact a poison control center or emergency room at once. NOTE: This medicine is only for you. Do not share this medicine with others. What if I miss a dose? It is important not to miss a dose. Call your doctor or health care professional if you are unable to keep an appointment. What may interact with this medication? This medicine may interact with the following medications: foscarnet certain antibiotics like amikacin, gentamicin, neomycin, polymyxin B, streptomycin, tobramycin, vancomycin This list may not describe all possible interactions. Give your health care provider a list of all the medicines, herbs, non-prescription drugs, or dietary supplements you use. Also tell them if you smoke, drink alcohol, or use illegal drugs. Some items may interact with your medicine. What should I watch for while using this medication? Your condition will be monitored carefully while you are receiving this medicine. You will need important blood work done while you are taking this medicine. This drug may make you feel generally unwell. This is not uncommon, as chemotherapy can affect healthy cells as well as cancer cells. Report any side effects. Continue your course of treatment even though you feel ill unless your doctor tells you to stop. This medicine may  increase your risk of getting an infection. Call your healthcare professional for advice if you get a fever, chills, or sore throat, or other symptoms of a cold or flu. Do not treat yourself. Try to avoid being around people who are sick. Avoid taking medicines that contain aspirin, acetaminophen, ibuprofen, naproxen, or ketoprofen unless instructed by your healthcare professional. These medicines may hide a fever. This medicine may increase your risk to bruise or bleed. Call your doctor or health care professional if you notice any unusual bleeding. Be careful brushing and flossing your teeth or using a toothpick because you may get an infection or bleed more easily. If you have any dental work done, tell your dentist you are receiving this medicine. Do not become pregnant while taking this medicine or for 14 months after stopping it. Women should inform their healthcare professional if they wish to become pregnant or think they might be pregnant. Men should not father a child while taking this medicine and for 11 months after stopping it. There is potential for serious side effects to an unborn child. Talk to your healthcare professional for more information. Do not breast-feed an infant while taking this medicine. This medicine has caused ovarian failure in some women. This medicine may make it more difficult to get pregnant. Talk to your healthcare professional if you are concerned about your fertility. This medicine  has caused decreased sperm counts in some men. This may make it more difficult to father a child. Talk to your healthcare professional if you are concerned about your fertility. Drink fluids as directed while you are taking this medicine. This will help protect your kidneys. Call your doctor or health care professional if you get diarrhea. Do not treat yourself. What side effects may I notice from receiving this medication? Side effects that you should report to your doctor or health care  professional as soon as possible: allergic reactions like skin rash, itching or hives, swelling of the face, lips, or tongue blurred vision changes in vision decreased hearing or ringing of the ears nausea, vomiting pain, redness, or irritation at site where injected pain, tingling, numbness in the hands or feet signs and symptoms of bleeding such as bloody or black, tarry stools; red or dark brown urine; spitting up blood or brown material that looks like coffee grounds; red spots on the skin; unusual bruising or bleeding from the eyes, gums, or nose signs and symptoms of infection like fever; chills; cough; sore throat; pain or trouble passing urine signs and symptoms of kidney injury like trouble passing urine or change in the amount of urine signs and symptoms of low red blood cells or anemia such as unusually weak or tired; feeling faint or lightheaded; falls; breathing problems Side effects that usually do not require medical attention (report to your doctor or health care professional if they continue or are bothersome): loss of appetite mouth sores muscle cramps This list may not describe all possible side effects. Call your doctor for medical advice about side effects. You may report side effects to FDA at 1-800-FDA-1088. Where should I keep my medication? This drug is given in a hospital or clinic and will not be stored at home. NOTE: This sheet is a summary. It may not cover all possible information. If you have questions about this medicine, talk to your doctor, pharmacist, or health care provider.  2022 Elsevier/Gold Standard (2021-07-31 00:00:00)  Etoposide, VP-16 injection What is this medication? ETOPOSIDE, VP-16 (e toe POE side) is a chemotherapy drug. It is used to treat testicular cancer, lung cancer, and other cancers. This medicine may be used for other purposes; ask your health care provider or pharmacist if you have questions. COMMON BRAND NAME(S): Etopophos,  Toposar, VePesid What should I tell my care team before I take this medication? They need to know if you have any of these conditions: infection kidney disease liver disease low blood counts, like low white cell, platelet, or red cell counts an unusual or allergic reaction to etoposide, other medicines, foods, dyes, or preservatives pregnant or trying to get pregnant breast-feeding How should I use this medication? This medicine is for infusion into a vein. It is administered in a hospital or clinic by a specially trained health care professional. Talk to your pediatrician regarding the use of this medicine in children. Special care may be needed. Overdosage: If you think you have taken too much of this medicine contact a poison control center or emergency room at once. NOTE: This medicine is only for you. Do not share this medicine with others. What if I miss a dose? It is important not to miss your dose. Call your doctor or health care professional if you are unable to keep an appointment. What may interact with this medication? This medicine may interact with the following medications: warfarin This list may not describe all possible interactions. Give your health  care provider a list of all the medicines, herbs, non-prescription drugs, or dietary supplements you use. Also tell them if you smoke, drink alcohol, or use illegal drugs. Some items may interact with your medicine. What should I watch for while using this medication? Visit your doctor for checks on your progress. This drug may make you feel generally unwell. This is not uncommon, as chemotherapy can affect healthy cells as well as cancer cells. Report any side effects. Continue your course of treatment even though you feel ill unless your doctor tells you to stop. In some cases, you may be given additional medicines to help with side effects. Follow all directions for their use. Call your doctor or health care professional for  advice if you get a fever, chills or sore throat, or other symptoms of a cold or flu. Do not treat yourself. This drug decreases your body's ability to fight infections. Try to avoid being around people who are sick. This medicine may increase your risk to bruise or bleed. Call your doctor or health care professional if you notice any unusual bleeding. Talk to your doctor about your risk of cancer. You may be more at risk for certain types of cancers if you take this medicine. Do not become pregnant while taking this medicine or for at least 6 months after stopping it. Women should inform their doctor if they wish to become pregnant or think they might be pregnant. Women of child-bearing potential will need to have a negative pregnancy test before starting this medicine. There is a potential for serious side effects to an unborn child. Talk to your health care professional or pharmacist for more information. Do not breast-feed an infant while taking this medicine. Men must use a latex condom during sexual contact with a woman while taking this medicine and for at least 4 months after stopping it. A latex condom is needed even if you have had a vasectomy. Contact your doctor right away if your partner becomes pregnant. Do not donate sperm while taking this medicine and for at least 4 months after you stop taking this medicine. Men should inform their doctors if they wish to father a child. This medicine may lower sperm counts. What side effects may I notice from receiving this medication? Side effects that you should report to your doctor or health care professional as soon as possible: allergic reactions like skin rash, itching or hives, swelling of the face, lips, or tongue low blood counts - this medicine may decrease the number of white blood cells, red blood cells, and platelets. You may be at increased risk for infections and bleeding nausea, vomiting redness, blistering, peeling or loosening of the  skin, including inside the mouth signs and symptoms of infection like fever; chills; cough; sore throat; pain or trouble passing urine signs and symptoms of low red blood cells or anemia such as unusually weak or tired; feeling faint or lightheaded; falls; breathing problems unusual bruising or bleeding Side effects that usually do not require medical attention (report to your doctor or health care professional if they continue or are bothersome): changes in taste diarrhea hair loss loss of appetite mouth sores This list may not describe all possible side effects. Call your doctor for medical advice about side effects. You may report side effects to FDA at 1-800-FDA-1088. Where should I keep my medication? This drug is given in a hospital or clinic and will not be stored at home. NOTE: This sheet is a summary. It may  not cover all possible information. If you have questions about this medicine, talk to your doctor, pharmacist, or health care provider.  2022 Elsevier/Gold Standard (2021-07-31 00:00:00)

## 2021-10-04 ENCOUNTER — Ambulatory Visit (HOSPITAL_COMMUNITY): Payer: Medicaid Other

## 2021-10-04 ENCOUNTER — Ambulatory Visit
Admission: RE | Admit: 2021-10-04 | Discharge: 2021-10-04 | Disposition: A | Discharge status: Home / Self Care | Payer: Medicaid Other | Source: Ambulatory Visit | Attending: Radiation Oncology | Admitting: Radiation Oncology

## 2021-10-04 ENCOUNTER — Inpatient Hospital Stay: Payer: Medicaid Other

## 2021-10-04 ENCOUNTER — Other Ambulatory Visit: Payer: Self-pay

## 2021-10-04 VITALS — BP 119/86 | HR 98 | Temp 97.9°F | Resp 18

## 2021-10-04 DIAGNOSIS — C3411 Malignant neoplasm of upper lobe, right bronchus or lung: Secondary | ICD-10-CM

## 2021-10-04 MED ORDER — SODIUM CHLORIDE 0.9 % IV SOLN: Freq: Once | INTRAVENOUS | Status: AC

## 2021-10-04 MED ORDER — SODIUM CHLORIDE 0.9 % IV SOLN
10.0000 mg | Freq: Once | INTRAVENOUS | Status: AC
  Administered 2021-10-04: 10 mg via INTRAVENOUS
  Filled 2021-10-04: qty 10

## 2021-10-04 MED ORDER — SODIUM CHLORIDE 0.9 % IV SOLN
100.0000 mg/m2 | Freq: Once | INTRAVENOUS | Status: AC
  Administered 2021-10-04: 190 mg via INTRAVENOUS
  Filled 2021-10-04: qty 9.5

## 2021-10-04 MED FILL — Dexamethasone Sodium Phosphate Inj 100 MG/10ML: INTRAMUSCULAR | Qty: 1 | Status: AC

## 2021-10-04 NOTE — Patient Instructions (Signed)
[facility] ONCOLOGY  Discharge Instructions: Thank you for choosing [facility] to provide your oncology and hematology care.   If you have a lab appointment with the [facility], please go directly to the [facility] and check in at the registration area.   Wear comfortable clothing and clothing appropriate for easy access to any Portacath or PICC line.   We strive to give you quality time with your provider. You may need to reschedule your appointment if you arrive late (15 or more minutes).  Arriving late affects you and other patients whose appointments are after yours.  Also, if you miss three or more appointments without notifying the office, you may be dismissed from the clinic at the provider's discretion.      For prescription refill requests, have your pharmacy contact our office and allow 72 hours for refills to be completed.    Today you received the following chemotherapy and/or immunotherapy agents Etoposide      To help prevent nausea and vomiting after your treatment, we encourage you to take your nausea medication as directed.  BELOW ARE SYMPTOMS THAT SHOULD BE REPORTED IMMEDIATELY: *FEVER GREATER THAN 100.4 F (38 C) OR HIGHER *CHILLS OR SWEATING *NAUSEA AND VOMITING THAT IS NOT CONTROLLED WITH YOUR NAUSEA MEDICATION *UNUSUAL SHORTNESS OF BREATH *UNUSUAL BRUISING OR BLEEDING *URINARY PROBLEMS (pain or burning when urinating, or frequent urination) *BOWEL PROBLEMS (unusual diarrhea, constipation, pain near the anus) TENDERNESS IN MOUTH AND THROAT WITH OR WITHOUT PRESENCE OF ULCERS (sore throat, sores in mouth, or a toothache) UNUSUAL RASH, SWELLING OR PAIN  UNUSUAL VAGINAL DISCHARGE OR ITCHING   Items with * indicate a potential emergency and should be followed up as soon as possible or go to the Emergency Department if any problems should occur.  Please show the CHEMOTHERAPY ALERT CARD or IMMUNOTHERAPY ALERT CARD at check-in to  the Emergency Department and triage nurse.  Should you have questions after your visit or need to cancel or reschedule your appointment, please contact [facility]  Dept: [phone]  and follow the prompts.  Office hours are 8:00 a.m. to 4:30 p.m. Monday - Friday. Please note that voicemails left after 4:00 p.m. may not be returned until the following business day.  We are closed weekends and major holidays. You have access to a nurse at all times for urgent questions. Please call the main number to the clinic Dept: [phone] and follow the prompts.   For any non-urgent questions, you may also contact your provider using MyChart. We now offer e-Visits for anyone 71 and older to request care online for non-urgent symptoms. For details visit [email or web address].   Also download the MyChart app! Go to the app store, search "MyChart", open the app, select [facility], and log in with your MyChart username and password.  Due to Covid, a mask is required upon entering the hospital/clinic. If you do not have a mask, one will be given to you upon arrival. For doctor visits, patients may have 1 support person aged 24 or older with them. For treatment visits, patients cannot have anyone with them due to current Covid guidelines and our immunocompromised population.

## 2021-10-05 ENCOUNTER — Inpatient Hospital Stay: Payer: Medicaid Other

## 2021-10-05 ENCOUNTER — Ambulatory Visit
Admission: RE | Admit: 2021-10-05 | Discharge: 2021-10-05 | Disposition: A | Discharge status: Home / Self Care | Payer: Medicaid Other | Source: Ambulatory Visit | Attending: Radiation Oncology | Admitting: Radiation Oncology

## 2021-10-05 VITALS — BP 133/86 | HR 95 | Temp 97.8°F | Resp 16

## 2021-10-05 DIAGNOSIS — C3411 Malignant neoplasm of upper lobe, right bronchus or lung: Secondary | ICD-10-CM

## 2021-10-05 MED ORDER — SODIUM CHLORIDE 0.9 % IV SOLN
100.0000 mg/m2 | Freq: Once | INTRAVENOUS | Status: AC
  Administered 2021-10-05: 190 mg via INTRAVENOUS
  Filled 2021-10-05: qty 9.5

## 2021-10-05 MED ORDER — SODIUM CHLORIDE 0.9 % IV SOLN
10.0000 mg | Freq: Once | INTRAVENOUS | Status: AC
  Administered 2021-10-05: 10 mg via INTRAVENOUS
  Filled 2021-10-05: qty 10
  Filled 2021-10-05: qty 1

## 2021-10-05 MED ORDER — SODIUM CHLORIDE 0.9 % IV SOLN: Freq: Once | INTRAVENOUS | Status: AC

## 2021-10-05 NOTE — Patient Instructions (Signed)
[facility]  Discharge Instructions: Thank you for choosing [facility] to provide your oncology and hematology care.   If you have a lab appointment with the [facility], please go directly to the Cancer Center and check in at the registration area.   Wear comfortable clothing and clothing appropriate for easy access to any Portacath or PICC line.   We strive to give you quality time with your provider. You may need to reschedule your appointment if you arrive late (15 or more minutes).  Arriving late affects you and other patients whose appointments are after yours.  Also, if you miss three or more appointments without notifying the office, you may be dismissed from the clinic at the provider's discretion.      For prescription refill requests, have your pharmacy contact our office and allow 72 hours for refills to be completed.    Today you received the following chemotherapy and/or immunotherapy agents: etoposide.     To help prevent nausea and vomiting after your treatment, we encourage you to take your nausea medication as directed.  BELOW ARE SYMPTOMS THAT SHOULD BE REPORTED IMMEDIATELY: . *FEVER GREATER THAN 100.4 F (38 C) OR HIGHER . *CHILLS OR SWEATING . *NAUSEA AND VOMITING THAT IS NOT CONTROLLED WITH YOUR NAUSEA MEDICATION . *UNUSUAL SHORTNESS OF BREATH . *UNUSUAL BRUISING OR BLEEDING . *URINARY PROBLEMS (pain or burning when urinating, or frequent urination) . *BOWEL PROBLEMS (unusual diarrhea, constipation, pain near the anus) . TENDERNESS IN MOUTH AND THROAT WITH OR WITHOUT PRESENCE OF ULCERS (sore throat, sores in mouth, or a toothache) . UNUSUAL RASH, SWELLING OR PAIN  . UNUSUAL VAGINAL DISCHARGE OR ITCHING   Items with * indicate a potential emergency and should be followed up as soon as possible or go to the Emergency Department if any problems should occur.  Please show the CHEMOTHERAPY ALERT CARD or IMMUNOTHERAPY ALERT  CARD at check-in to the Emergency Department and triage nurse.  Should you have questions after your visit or need to cancel or reschedule your appointment, please contact [facility]  Dept: [phone]  and follow the prompts.  Office hours are 8:00 a.m. to 4:30 p.m. Monday - Friday. Please note that voicemails left after 4:00 p.m. may not be returned until the following business day.  We are closed weekends and major holidays. You have access to a nurse at all times for urgent questions. Please call the main number to the clinic Dept: [phone] and follow the prompts.   For any non-urgent questions, you may also contact your provider using MyChart. We now offer e-Visits for anyone 18 and older to request care online for non-urgent symptoms. For details visit [email or web address].   Also download the MyChart app! Go to the app store, search "MyChart", open the app, select [facility], and log in with your MyChart username and password.  Due to Covid, a mask is required upon entering the hospital/clinic. If you do not have a mask, one will be given to you upon arrival. For doctor visits, patients may have 1 support person aged 18 or older with them. For treatment visits, patients cannot have anyone with them due to current Covid guidelines and our immunocompromised population.   

## 2021-10-06 ENCOUNTER — Ambulatory Visit: Payer: Medicaid Other

## 2021-10-07 ENCOUNTER — Ambulatory Visit: Payer: Medicaid Other

## 2021-10-08 ENCOUNTER — Ambulatory Visit
Admission: RE | Admit: 2021-10-08 | Discharge: 2021-10-08 | Disposition: A | Discharge status: Home / Self Care | Payer: Medicaid Other | Source: Ambulatory Visit | Attending: Radiation Oncology | Admitting: Radiation Oncology

## 2021-10-08 ENCOUNTER — Other Ambulatory Visit: Payer: Self-pay

## 2021-10-08 DIAGNOSIS — C3411 Malignant neoplasm of upper lobe, right bronchus or lung: Secondary | ICD-10-CM | POA: Diagnosis not present

## 2021-10-08 NOTE — Progress Notes (Signed)
[facility] OFFICE PROGRESS NOTE  [name], PA-C [place] [address]  DIAGNOSIS: Limited stage (T3, N2, M0) small cell lung cancer presented with right hilar/suprahilar mass with occlusion of the right upper lobe bronchus in addition to right paratracheal, right infra hilar and subcarinal lymphadenopathy as well as additional right upper lobe and right apical pulmonary nodules.  This was diagnosed in October 2022.  PRIOR THERAPY: None   CURRENT THERAPY: Systemic chemotherapy with cisplatin 80 Mg/M2 on day 1 and etoposide 100 Mg/M2 on days 1, 2 and 3 every 3 weeks.  First dose October 03, 2021.  This will be concurrent with radiotherapy under the care of Dr. Sondra Come. Status post 1 cycle.   INTERVAL HISTORY: [name] 50 y.o. female returns to the clinic today for a follow-up visit accompanied by her mother.  The patient is feeling fair today without any concerning complaints except for sore throat with swallowing. She is scheduled for radiation until 11/07/21. She has viscous lidocaine but it makes her sick. She has carafate but also has a hard time with the consistency and taste of this and is only using this once a day. She states she is not eating or drinking a lot due to the pain. Her weight is stable compared to her last appointment but she did lose 6 lbs since October. She notes she is not a big water drinker and has not been staying hydrated.   The patient was recently diagnosed with small cell lung cancer, pending staging work-up.  Due to the patient being symptomatic, based on the CT scan, she is being treated as a limited stage small cell lung cancer; however, she is scheduled for her staging PET scan tomorrow on 10/12/2021.  There was some delays with insurance authorization for her staging work-up.  The patient underwent her first cycle of treatment last week and tolerated it fairly well. Today she denies any fevers. She has been having  night sweats frequently for a few months. She continues to have occasional chest pressure exacerbated by coughing. She also has a cough that is thick and she has a hard time getting it up. She has some emesis mostly from getting choked on phlegm or having a hard time tolerating tastes/textures of things in her mouth. She states she is prone to vomiting because of this and has been that way for most of her life. She has dyspnea on exertion.  She denies any diarrhea or constipation.  Denies any headache or visual changes.  The patient is here today for evaluation and 1 week follow-up visit to manage any adverse side effects of treatment.      MEDICAL HISTORY: Past Medical History:  Diagnosis Date   Ankle sprain    left   Anxiety    treated for panic attacks in the past.   Asthma    COPD (chronic obstructive pulmonary disease) (HCC)    Depression    Irregular heart rate    Lung cancer (HCC)    Renal disorder    kidney infections    ALLERGIES:  is allergic to naproxen and bactrim [sulfamethoxazole-trimethoprim].  MEDICATIONS:  Current Outpatient Medications  Medication Sig Dispense Refill   magnesium oxide (MAG-OX) 400 (240 Mg) MG tablet Take 1 tablet (400 mg total) by mouth daily. 30 tablet 2   ondansetron (ZOFRAN ODT) 8 MG disintegrating tablet Dissolve 1 tablet (8 mg total) by mouth every 8 (eight) hours as needed for  nausea or vomiting. 30 tablet 2   albuterol (PROVENTIL HFA;VENTOLIN HFA) 108 (90 Base) MCG/ACT inhaler Inhale 2 puffs into the lungs every 6 (six) hours as needed for wheezing or shortness of breath. (Patient not taking: Reported on 09/15/2021) 1 Inhaler 2   ALPRAZolam (XANAX) 0.25 MG tablet Take 1 tablet (0.25 mg total) by mouth 3 (three) times daily. As needed for anxiety 30 tablet 0   lidocaine (XYLOCAINE) 2 % solution Use as directed 15 mLs in the mouth or throat as needed for mouth pain. Swallow ~ 20 minutes prior to meals, dilute as per writtten instructions 100 mL 2    oxyCODONE-acetaminophen (PERCOCET/ROXICET) 5-325 MG tablet Take 1 tablet by mouth every 8 (eight) hours as needed for severe pain. 30 tablet 0   prochlorperazine (COMPAZINE) 10 MG tablet Take 1 tablet (10 mg total) by mouth every 6 (six) hours as needed for nausea or vomiting. 30 tablet 1   sucralfate (CARAFATE) 1 g tablet Take 1 tablet (1 g total) by mouth 4 (four) times daily -  with meals and at bedtime. Crush and dissolve in 10 mL of warm water prior to swallowing 120 tablet 1   No current facility-administered medications for this visit.    SURGICAL HISTORY:  Past Surgical History:  Procedure Laterality Date   APPENDECTOMY     BRONCHIAL BIOPSY  09/21/2021   Procedure: BRONCHIAL BIOPSIES;  Surgeon: Collene Gobble, MD;  Location: [facility] ENDOSCOPY;  Service: Pulmonary;;   BRONCHIAL BRUSHINGS  09/21/2021   Procedure: BRONCHIAL BRUSHINGS;  Surgeon: Collene Gobble, MD;  Location: [facility] ENDOSCOPY;  Service: Pulmonary;;   BRONCHIAL NEEDLE ASPIRATION BIOPSY  09/21/2021   Procedure: BRONCHIAL NEEDLE ASPIRATION BIOPSIES;  Surgeon: Collene Gobble, MD;  Location: [facility] ENDOSCOPY;  Service: Pulmonary;;   LUMBAR LAMINECTOMY N/A 05/13/2018   Procedure: LEFT LUMBAR FOUR-FIVE MICRODISCECTOMY;  Surgeon: Marybelle Killings, MD;  Location: [facility];  Service: Orthopedics;  Laterality: N/A;   TUBAL LIGATION     TUBAL LIGATION  1998   VIDEO BRONCHOSCOPY WITH ENDOBRONCHIAL ULTRASOUND N/A 09/21/2021   Procedure: VIDEO BRONCHOSCOPY WITH ENDOBRONCHIAL ULTRASOUND;  Surgeon: Collene Gobble, MD;  Location: [facility] ENDOSCOPY;  Service: Pulmonary;  Laterality: N/A;    REVIEW OF SYSTEMS:   Review of Systems  Constitutional: Positive for decreased appetite and weight loss. Negative for chills and fever.  HENT:   Negative for mouth sores, nosebleeds, sore throat and trouble swallowing.   Eyes: Negative for eye problems and icterus.  Respiratory: Positive for cough and shortness of breath. Negative for hemoptysis and wheezing.    Cardiovascular: Positive for chest discomfort with coughing. Negative for leg swelling.  Gastrointestinal: Positive for nausea/dry heaving with coughing/textures of food. Negative for abdominal pain, constipation, and diarrhea.  Genitourinary: Negative for bladder incontinence, difficulty urinating, dysuria, frequency and hematuria.   Musculoskeletal: Negative for back pain, gait problem, neck pain and neck stiffness.  Skin: Negative for itching and rash.  Neurological: Negative for dizziness, extremity weakness, gait problem, headaches, light-headedness and seizures.  Hematological: Negative for adenopathy. Does not bruise/bleed easily.  Psychiatric/Behavioral: Negative for confusion, depression and sleep disturbance. The patient is not nervous/anxious.     PHYSICAL EXAMINATION:  Blood pressure 110/86, pulse (!) 126, temperature (!) 96.6 F (35.9 C), temperature source Tympanic, resp. rate 18, weight 174 lb 6.4 oz (79.1 kg), last menstrual period 04/24/2018, SpO2 95 %.  ECOG PERFORMANCE STATUS: 1  Physical Exam  Constitutional: Oriented to person, place, and time and well-developed, well-nourished, and in  no distress.  HENT:  Head: Normocephalic and atraumatic.  Mouth/Throat: Oropharynx is clear and moist. No oropharyngeal exudate.  Eyes: Conjunctivae are normal. Right eye exhibits no discharge. Left eye exhibits no discharge. No scleral icterus.  Neck: Normal range of motion. Neck supple.  Cardiovascular: tachycardic, regular rhythm, normal heart sounds and intact distal pulses.   Pulmonary/Chest: Effort normal and breath sounds normal. No respiratory distress. No wheezes. No rales.  Abdominal: Soft. Bowel sounds are normal. Exhibits no distension and no mass. There is no tenderness.  Musculoskeletal: Normal range of motion. Exhibits no edema.  Lymphadenopathy:    No cervical adenopathy.  Neurological: Alert and oriented to person, place, and time. Exhibits normal muscle tone. Gait  normal. Coordination normal.  Skin: Skin is warm and dry. No rash noted. Not diaphoretic. No erythema. No pallor.  Psychiatric: Mood, memory and judgment normal.  Vitals reviewed.  LABORATORY DATA: Lab Results  Component Value Date   WBC 3.8 (L) 10/11/2021   HGB 13.4 10/11/2021   HCT 39.1 10/11/2021   MCV 94.2 10/11/2021   PLT 159 10/11/2021      Chemistry      Component Value Date/Time   NA 135 10/11/2021 0724   NA 139 02/18/2017 0932   K 4.0 10/11/2021 0724   CL 102 10/11/2021 0724   CO2 21 (L) 10/11/2021 0724   BUN 16 10/11/2021 0724   BUN 11 02/18/2017 0932   CREATININE 0.68 10/11/2021 0724      Component Value Date/Time   CALCIUM 10.0 10/11/2021 0724   ALKPHOS 73 10/11/2021 0724   AST 16 10/11/2021 0724   ALT 15 10/11/2021 0724   BILITOT 0.9 10/11/2021 0724       RADIOGRAPHIC STUDIES:  DG Chest 2 View  Result Date: 09/15/2021 CLINICAL DATA:  Shortness of breath. Recent diagnosis of lung cancer. Cough. Chest pain. EXAM: CHEST - 2 VIEW COMPARISON:  Chest CT 09/03/2021 FINDINGS: Normal heart size. Stable mediastinal contours with right suprahilar mass. Emphysema with chronic bronchial thickening. No new airspace disease. No pneumothorax or pleural effusion. No acute osseous abnormalities are seen. IMPRESSION: 1. Unchanged right suprahilar mass, recently assessed on chest CT. 2. No acute chest finding. Electronically Signed   By: Keith Rake M.D.   On: 09/15/2021 17:05   CT Angio Chest PE W/Cm &/Or Wo Cm  Result Date: 09/15/2021 CLINICAL DATA:  PE suspected, high prob Shortness of breath.  Recent diagnosis of lung cancer. EXAM: CT ANGIOGRAPHY CHEST WITH CONTRAST TECHNIQUE: Multidetector CT imaging of the chest was performed using the standard protocol during bolus administration of intravenous contrast. Multiplanar CT image reconstructions and MIPs were obtained to evaluate the vascular anatomy. CONTRAST:  60mL OMNIPAQUE IOHEXOL 350 MG/ML SOLN COMPARISON:   Radiograph earlier today. Chest CT 09/03/2021, 10 days ago FINDINGS: Cardiovascular: There are no filling defects within the pulmonary arteries to suggest pulmonary embolus. The right upper lobe pulmonary artery is attenuated and encased by right suprahilar mass, occluded distally, but no evidence of intraluminal thrombus. Mild aortic atherosclerosis without aneurysm. Heart is normal in size. No pericardial effusion or thickening. Mediastinum/Nodes: Presumed central right upper lobe mass is contiguous with the right hilum and mediastinal adenopathy. It is difficult to accurately assess the size of the mass due to confluent adjacent lymph nodes, however mass and nodal conglomerate span at least 6.5 x 4.7 cm, series 5, image 118. This previously measured 6.1 x 4.6 cm on my retrospective measurement. This causes complete occlusion of the right upper lobe bronchus.  There is right paratracheal adenopathy with lymph node measuring 2.5 cm short axis, series 4, image 41. There is a prominent subcarinal node. Enlarged right infrahilar nodes measure up to 16 mm, series 4, image 87. 9 mm high paratracheal node series 4, image 29. There is no contralateral left hilar adenopathy. Unremarkable CT appearance of the thyroid gland and esophagus. Lungs/Pleura: Right upper lobe suprahilar mass which is contiguous with adenopathy, as described above. Borders are irregular with multiple satellite nodules and ground-glass extending towards the apex. There is a separate subpleural nodule in the right upper lobe measuring 1.4 x 1.9 cm unchanged by my retrospective measurement, series 6, image 34. 5 mm subpleural right apical nodule, series 6, image 38. Paramediastinal atelectasis in the right upper lobe, likely postobstructive. No pulmonary nodules in the left lung. Moderate emphysema. No pleural fluid. Upper Abdomen: No adrenal nodule. No visualized liver lesion. No acute findings in the included upper abdomen. Musculoskeletal: No  destructive lytic lesion. No blastic osseous lesion. No acute osseous abnormalities are seen. No chest wall soft tissue abnormality. Review of the MIP images confirms the above findings. IMPRESSION: 1. No pulmonary embolus. 2. Right hilar/suprahilar mass which is contiguous with adenopathy consistent with malignancy. This causes complete occlusion of the right upper lobe bronchus. The right upper lobe pulmonary artery is attenuated and encased by the right suprahilar mass, but no evidence of intraluminal thrombus. 3. Right paratracheal, right infrahilar, and subcarinal adenopathy. 4. Additional right upper lobe and right apical pulmonary nodules. 5. No definite acute findings or change from recent chest CT. 6. Moderate emphysema. Aortic Atherosclerosis (ICD10-I70.0) and Emphysema (ICD10-J43.9). Electronically Signed   By: Keith Rake M.D.   On: 09/15/2021 20:17   MR Brain W Wo Contrast  Result Date: 09/20/2021 CLINICAL DATA:  50 year old female with right hilar lung mass on recent CTs this month. Staging lung cancer. EXAM: MRI HEAD WITHOUT AND WITH CONTRAST TECHNIQUE: Multiplanar, multiecho pulse sequences of the brain and surrounding structures were obtained without and with intravenous contrast. CONTRAST:  17mL GADAVIST GADOBUTROL 1 MMOL/ML IV SOLN COMPARISON:  CTA chest 09/15/2021 FINDINGS: Brain: No midline shift, mass effect, or evidence of intracranial mass lesion. No abnormal enhancement identified. No dural thickening identified. Normal cerebral volume. No restricted diffusion to suggest acute infarction. No ventriculomegaly, extra-axial collection or acute intracranial hemorrhage. Cervicomedullary junction and pituitary are within normal limits. There is moderate patchy T2 and FLAIR hyperintensity in the pons, but no associated enhancement or mass effect. No chronic cerebral blood products. Elsewhere gray and white matter signal is within normal limits for age. No cortical encephalomalacia  identified. Deep gray nuclei and cerebellum are within normal limits. Vascular: Major intracranial vascular flow voids are preserved. The right vertebral artery appears dominant. The major dural venous sinuses are enhancing and appear to be patent. Skull and upper cervical spine: Negative visible cervical spine and spinal cord. Visualized bone marrow signal is within normal limits. Sinuses/Orbits: Negative orbits. Paranasal sinuses are clear aside from subtle mucosal thickening or a tiny retention cyst in the right maxillary alveolar recess. Other: Mastoids are clear. Visible internal auditory structures appear normal. Visible scalp and face soft tissues appear grossly negative. IMPRESSION: 1. No metastatic disease or acute intracranial abnormality. 2. Moderate patchy signal changes in the pons, probably due to chronic small vessel disease. But otherwise normal for age MRI appearance of the brain. Electronically Signed   By: Genevie Ann M.D.   On: 09/20/2021 08:53   DG CHEST PORT 1 VIEW  Result Date: 09/21/2021 CLINICAL DATA:  50 year old female with history of shortness of breath following bronchoscopy. EXAM: PORTABLE CHEST 1 VIEW COMPARISON:  Chest x-ray 09/15/2021. FINDINGS: Large right upper lobe mass again noted, better demonstrated on recent chest CT 09/15/2021. Fullness in the right paratracheal region reflects underlying malignant lymphadenopathy. Mild emphysematous changes. Patchy areas of interstitial prominence throughout the mid to lower lungs bilaterally where there is also diffuse peribronchial cuffing, similar to the prior study. No new acute consolidative airspace disease. No pneumothorax. No evidence of pulmonary edema. Heart size is normal. IMPRESSION: 1. Stable radiographic appearance of the chest with no pneumothorax or other acute complicating features following recent bronchoscopy. Electronically Signed   By: Vinnie Langton M.D.   On: 09/21/2021 09:17     ASSESSMENT/PLAN:  This is a very  pleasant 50 year old Caucasian female diagnosed with limited stage (T3, N2, MX) small cell lung cancer.  She presented with a right hilar/suprahilar mass with occlusion of the right upper lobe bronchus in addition to right paratracheal, right infrahilar, and subcarinal lymphadenopathy.  She also has right upper lobe and right apical pulmonary nodules.  She was diagnosed in October 2022.  Her brain MRI did not show any evidence of metastatic disease to the brain.  Her PET scan is not scheduled until tomorrow 10/12/2021.  The patient is currently undergoing treatment with systemic chemotherapy cisplatin 80 mg per metered squared on days 1, etoposide 100 mg per metered squared on days 1, 2, and 3 IV every 3 weeks.  She is also receiving concurrent radiation.  She is status post 1 cycle and tolerated it fairly well.   At the patient's staging PET scan tomorrow shows evidence for extensive stage disease, Dr. Julien Nordmann will change her treatment to carboplatin, etoposide, and Imfinzi starting from cycle #2.   Labs reviewed.  Recommend that she continue with cycle number 2 in 2 weeks as scheduled.  We will see her back for follow-up visit in 2 weeks for evaluation before starting cycle #2.  She knows the importance of attending her PET scan tomorrow as scheduled.  Most of her nausea/vomiting seems to be from coughing or inability to tolerate certain textures. She states she is prone to this and has had problems with this most of her life. I will send her zofran ODT if needed for nausea since it hurts to swallow pills. Reviewed that this should be taken every 8 hours PRN at least 3 days after chemotherapy due to her receiving aloxi in the clinic.   Will reach out to radiation oncology to see if they have additional recommendations for her esophagitis. She cannot tolerate viscous lidocaine. She also has only been using carafate once a day. I reviewed this with her and encouraged her to try to use it as  prescribed before meals and bedtime. Encouraged and reviewed why she needs to stay hydrated with cisplatin to avoid renal insuffiencey/nephrotoxicity. Encouraged to flavor water if that helps. Encouraged to drink supplemental drinks and drink things at room temp since she has esophagitis.   Encouraged to use delsym and robitussin for cough. She was encouraged to use mucinex if needed to break up secretions.   Her pulse was 108 upon recheck. Once again, encouraged hydration. Rhythm regular. However, if she ever develops syncope, light headedness, shortness of breath, or chest pain, she knows to go to the ER.   Her magnesium is low. Will send 400 mg p.o. daily to pharmacy of magnesium oxide.   The patient was advised  to call immediately if she has any concerning symptoms in the interval. The patient voices understanding of current disease status and treatment options and is in agreement with the current care plan. All questions were answered. The patient knows to call the clinic with any problems, questions or concerns. We can certainly see the patient much sooner if necessary      No orders of the defined types were placed in this encounter.    The total time spent in the appointment was 20-29 minutes.  Enriqueta Augusta L Alyiah Ulloa, PA-C 10/11/21

## 2021-10-09 ENCOUNTER — Other Ambulatory Visit (HOSPITAL_COMMUNITY): Payer: Self-pay

## 2021-10-09 ENCOUNTER — Other Ambulatory Visit: Payer: Self-pay | Admitting: Radiation Oncology

## 2021-10-09 ENCOUNTER — Ambulatory Visit
Admission: RE | Admit: 2021-10-09 | Discharge: 2021-10-09 | Disposition: A | Discharge status: Home / Self Care | Payer: Medicaid Other | Source: Ambulatory Visit | Attending: Radiation Oncology | Admitting: Radiation Oncology

## 2021-10-09 ENCOUNTER — Encounter: Payer: Self-pay | Admitting: Internal Medicine

## 2021-10-09 DIAGNOSIS — C3411 Malignant neoplasm of upper lobe, right bronchus or lung: Secondary | ICD-10-CM | POA: Diagnosis not present

## 2021-10-09 MED ORDER — SUCRALFATE 1 G PO TABS
1.0000 g | ORAL_TABLET | Freq: Three times a day (TID) | ORAL | 1 refills | Status: DC
  Filled 2021-10-09: qty 120, 30d supply, fill #0

## 2021-10-09 MED ORDER — LIDOCAINE VISCOUS HCL 2 % MT SOLN
15.0000 mL | OROMUCOSAL | 2 refills | Status: DC | PRN
  Filled 2021-10-09: qty 100, 3d supply, fill #0

## 2021-10-09 MED ORDER — SONAFINE EX EMUL
1.0000 "application " | Freq: Once | CUTANEOUS | Status: AC
  Administered 2021-10-09: 1 via TOPICAL

## 2021-10-10 ENCOUNTER — Other Ambulatory Visit: Payer: Self-pay

## 2021-10-10 ENCOUNTER — Ambulatory Visit
Admission: RE | Admit: 2021-10-10 | Discharge: 2021-10-10 | Disposition: A | Discharge status: Home / Self Care | Payer: Medicaid Other | Source: Ambulatory Visit | Attending: Radiation Oncology | Admitting: Radiation Oncology

## 2021-10-10 DIAGNOSIS — C3411 Malignant neoplasm of upper lobe, right bronchus or lung: Secondary | ICD-10-CM | POA: Diagnosis not present

## 2021-10-11 ENCOUNTER — Inpatient Hospital Stay (HOSPITAL_BASED_OUTPATIENT_CLINIC_OR_DEPARTMENT_OTHER): Payer: Medicaid Other

## 2021-10-11 ENCOUNTER — Other Ambulatory Visit (HOSPITAL_COMMUNITY): Payer: Self-pay

## 2021-10-11 ENCOUNTER — Inpatient Hospital Stay (HOSPITAL_BASED_OUTPATIENT_CLINIC_OR_DEPARTMENT_OTHER): Payer: Medicaid Other | Admitting: Physician Assistant

## 2021-10-11 ENCOUNTER — Ambulatory Visit
Admission: RE | Admit: 2021-10-11 | Discharge: 2021-10-11 | Disposition: A | Discharge status: Home / Self Care | Payer: Medicaid Other | Source: Ambulatory Visit | Attending: Radiation Oncology | Admitting: Radiation Oncology

## 2021-10-11 ENCOUNTER — Encounter: Payer: Self-pay | Admitting: Internal Medicine

## 2021-10-11 VITALS — BP 110/86 | HR 126 | Temp 96.6°F | Resp 18 | Wt 174.4 lb

## 2021-10-11 DIAGNOSIS — R112 Nausea with vomiting, unspecified: Secondary | ICD-10-CM

## 2021-10-11 DIAGNOSIS — C3411 Malignant neoplasm of upper lobe, right bronchus or lung: Secondary | ICD-10-CM

## 2021-10-11 LAB — CBC WITH DIFFERENTIAL (CANCER CENTER ONLY)
Abs Immature Granulocytes: 0.03 10*3/uL (ref 0.00–0.07)
Basophils Absolute: 0 10*3/uL (ref 0.0–0.1)
Basophils Relative: 0 %
Eosinophils Absolute: 0 10*3/uL (ref 0.0–0.5)
Eosinophils Relative: 1 %
HCT: 39.1 % (ref 36.0–46.0)
Hemoglobin: 13.4 g/dL (ref 12.0–15.0)
Immature Granulocytes: 1 %
Lymphocytes Relative: 22 %
Lymphs Abs: 0.8 10*3/uL (ref 0.7–4.0)
MCH: 32.3 pg (ref 26.0–34.0)
MCHC: 34.3 g/dL (ref 30.0–36.0)
MCV: 94.2 fL (ref 80.0–100.0)
Monocytes Absolute: 0.1 10*3/uL (ref 0.1–1.0)
Monocytes Relative: 3 %
Neutro Abs: 2.8 10*3/uL (ref 1.7–7.7)
Neutrophils Relative %: 73 %
Platelet Count: 159 10*3/uL (ref 150–400)
RBC: 4.15 MIL/uL (ref 3.87–5.11)
RDW: 13 % (ref 11.5–15.5)
WBC Count: 3.8 10*3/uL — ABNORMAL LOW (ref 4.0–10.5)
nRBC: 0 % (ref 0.0–0.2)

## 2021-10-11 LAB — CMP (CANCER CENTER ONLY)
ALT: 15 U/L (ref 0–44)
AST: 16 U/L (ref 15–41)
Albumin: 3.8 g/dL (ref 3.5–5.0)
Alkaline Phosphatase: 73 U/L (ref 38–126)
Anion gap: 12 (ref 5–15)
BUN: 16 mg/dL (ref 6–20)
CO2: 21 mmol/L — ABNORMAL LOW (ref 22–32)
Calcium: 10 mg/dL (ref 8.9–10.3)
Chloride: 102 mmol/L (ref 98–111)
Creatinine: 0.68 mg/dL (ref 0.44–1.00)
GFR, Estimated: >60 mL/min (ref >60)
Glucose, Bld: 109 mg/dL — ABNORMAL HIGH (ref 70–99)
Potassium: 4 mmol/L (ref 3.5–5.1)
Sodium: 135 mmol/L (ref 135–145)
Total Bilirubin: 0.9 mg/dL (ref 0.3–1.2)
Total Protein: 7.9 g/dL (ref 6.5–8.1)

## 2021-10-11 LAB — MAGNESIUM: Magnesium: 1.5 mg/dL — ABNORMAL LOW (ref 1.7–2.4)

## 2021-10-11 MED ORDER — ONDANSETRON 8 MG PO TBDP
8.0000 mg | ORAL_TABLET | Freq: Three times a day (TID) | ORAL | 2 refills | Status: DC | PRN
  Filled 2021-10-11: qty 30, 10d supply, fill #0

## 2021-10-11 MED ORDER — MAGNESIUM OXIDE -MG SUPPLEMENT 400 (240 MG) MG PO TABS
400.0000 mg | ORAL_TABLET | Freq: Every day | ORAL | 2 refills | Status: DC
Start: 2021-10-11 — End: 2021-10-30
  Filled 2021-10-11 (×2): qty 30, 30d supply, fill #0

## 2021-10-12 ENCOUNTER — Encounter (HOSPITAL_COMMUNITY)
Admission: RE | Admit: 2021-10-12 | Discharge: 2021-10-12 | Disposition: A | Discharge status: Home / Self Care | Payer: Medicaid Other | Source: Ambulatory Visit | Attending: Radiation Oncology | Admitting: Radiation Oncology

## 2021-10-12 ENCOUNTER — Ambulatory Visit
Admission: RE | Admit: 2021-10-12 | Discharge: 2021-10-12 | Disposition: A | Discharge status: Home / Self Care | Payer: Medicaid Other | Source: Ambulatory Visit | Attending: Radiation Oncology | Admitting: Radiation Oncology

## 2021-10-12 ENCOUNTER — Other Ambulatory Visit: Payer: Self-pay

## 2021-10-12 DIAGNOSIS — R918 Other nonspecific abnormal finding of lung field: Secondary | ICD-10-CM | POA: Insufficient documentation

## 2021-10-12 LAB — GLUCOSE, CAPILLARY: Glucose-Capillary: 98 mg/dL (ref 70–99)

## 2021-10-12 MED ORDER — FLUDEOXYGLUCOSE F - 18 (FDG) INJECTION
8.6000 | Freq: Once | INTRAVENOUS | Status: AC
  Administered 2021-10-12: 8.6 via INTRAVENOUS

## 2021-10-13 ENCOUNTER — Ambulatory Visit: Payer: Medicaid Other

## 2021-10-14 ENCOUNTER — Ambulatory Visit: Payer: Medicaid Other

## 2021-10-14 ENCOUNTER — Ambulatory Visit
Admission: RE | Admit: 2021-10-14 | Discharge: 2021-10-14 | Disposition: A | Discharge status: Home / Self Care | Payer: Medicaid Other | Source: Ambulatory Visit | Attending: Radiation Oncology | Admitting: Radiation Oncology

## 2021-10-14 DIAGNOSIS — C3411 Malignant neoplasm of upper lobe, right bronchus or lung: Secondary | ICD-10-CM | POA: Diagnosis not present

## 2021-10-15 ENCOUNTER — Other Ambulatory Visit: Payer: Self-pay | Admitting: Internal Medicine

## 2021-10-15 ENCOUNTER — Encounter: Payer: Self-pay | Admitting: Internal Medicine

## 2021-10-15 ENCOUNTER — Other Ambulatory Visit (HOSPITAL_COMMUNITY): Payer: Self-pay

## 2021-10-15 ENCOUNTER — Other Ambulatory Visit: Payer: Self-pay

## 2021-10-15 ENCOUNTER — Ambulatory Visit
Admission: RE | Admit: 2021-10-15 | Discharge: 2021-10-15 | Disposition: A | Discharge status: Home / Self Care | Payer: Medicaid Other | Source: Ambulatory Visit | Attending: Radiation Oncology | Admitting: Radiation Oncology

## 2021-10-15 DIAGNOSIS — C3411 Malignant neoplasm of upper lobe, right bronchus or lung: Secondary | ICD-10-CM | POA: Diagnosis not present

## 2021-10-15 MED ORDER — OXYCODONE-ACETAMINOPHEN 5-325 MG PO TABS
1.0000 | ORAL_TABLET | Freq: Three times a day (TID) | ORAL | 0 refills | Status: DC | PRN
  Filled 2021-10-15: qty 30, 10d supply, fill #0

## 2021-10-16 ENCOUNTER — Ambulatory Visit
Admission: RE | Admit: 2021-10-16 | Discharge: 2021-10-16 | Disposition: A | Discharge status: Home / Self Care | Payer: Medicaid Other | Source: Ambulatory Visit | Attending: Radiation Oncology | Admitting: Radiation Oncology

## 2021-10-16 ENCOUNTER — Inpatient Hospital Stay: Payer: Medicaid Other

## 2021-10-16 DIAGNOSIS — C3411 Malignant neoplasm of upper lobe, right bronchus or lung: Secondary | ICD-10-CM

## 2021-10-16 LAB — CBC WITH DIFFERENTIAL (CANCER CENTER ONLY)
Abs Immature Granulocytes: 0 10*3/uL (ref 0.00–0.07)
Basophils Absolute: 0 10*3/uL (ref 0.0–0.1)
Basophils Relative: 1 %
Eosinophils Absolute: 0.1 10*3/uL (ref 0.0–0.5)
Eosinophils Relative: 4 %
HCT: 35.1 % — ABNORMAL LOW (ref 36.0–46.0)
Hemoglobin: 11.7 g/dL — ABNORMAL LOW (ref 12.0–15.0)
Immature Granulocytes: 0 %
Lymphocytes Relative: 42 %
Lymphs Abs: 0.7 10*3/uL (ref 0.7–4.0)
MCH: 33 pg (ref 26.0–34.0)
MCHC: 33.3 g/dL (ref 30.0–36.0)
MCV: 98.9 fL (ref 80.0–100.0)
Monocytes Absolute: 0.2 10*3/uL (ref 0.1–1.0)
Monocytes Relative: 10 %
Neutro Abs: 0.7 10*3/uL — ABNORMAL LOW (ref 1.7–7.7)
Neutrophils Relative %: 43 %
Platelet Count: 82 10*3/uL — ABNORMAL LOW (ref 150–400)
RBC: 3.55 MIL/uL — ABNORMAL LOW (ref 3.87–5.11)
RDW: 13.2 % (ref 11.5–15.5)
WBC Count: 1.7 10*3/uL — ABNORMAL LOW (ref 4.0–10.5)
nRBC: 0 % (ref 0.0–0.2)

## 2021-10-16 LAB — CMP (CANCER CENTER ONLY)
ALT: 10 U/L (ref 0–44)
AST: 9 U/L — ABNORMAL LOW (ref 15–41)
Albumin: 3.6 g/dL (ref 3.5–5.0)
Alkaline Phosphatase: 75 U/L (ref 38–126)
Anion gap: 8 (ref 5–15)
BUN: 11 mg/dL (ref 6–20)
CO2: 26 mmol/L (ref 22–32)
Calcium: 9.8 mg/dL (ref 8.9–10.3)
Chloride: 105 mmol/L (ref 98–111)
Creatinine: 0.71 mg/dL (ref 0.44–1.00)
GFR, Estimated: >60 mL/min (ref >60)
Glucose, Bld: 116 mg/dL — ABNORMAL HIGH (ref 70–99)
Potassium: 3.7 mmol/L (ref 3.5–5.1)
Sodium: 139 mmol/L (ref 135–145)
Total Bilirubin: 0.3 mg/dL (ref 0.3–1.2)
Total Protein: 7.6 g/dL (ref 6.5–8.1)

## 2021-10-16 LAB — MAGNESIUM: Magnesium: 1.5 mg/dL — ABNORMAL LOW (ref 1.7–2.4)

## 2021-10-17 ENCOUNTER — Ambulatory Visit
Admission: RE | Admit: 2021-10-17 | Discharge: 2021-10-17 | Disposition: A | Discharge status: Home / Self Care | Payer: Medicaid Other | Source: Ambulatory Visit | Attending: Radiation Oncology | Admitting: Radiation Oncology

## 2021-10-17 ENCOUNTER — Other Ambulatory Visit: Payer: Self-pay

## 2021-10-17 DIAGNOSIS — C3411 Malignant neoplasm of upper lobe, right bronchus or lung: Secondary | ICD-10-CM | POA: Diagnosis not present

## 2021-10-19 ENCOUNTER — Telehealth: Payer: Self-pay

## 2021-10-19 NOTE — Telephone Encounter (Signed)
This nurse received a message from this patient stating that she has not been able to eat due to severe pain in chest and throat, lightheaded and unable to get warm for 2 days.  This nurse spoke with symptom management clinic PA and was advised to offer her an appointment for today to come in for fluids.  This nurse spoke to patient who stated that she has been able to get some water and Gatorade and the lightheadedness and she has warmed up a lot.  She states the pain is still severe but she does understand that it is due to the radiation.  She denied the [facility] appointment and stated that she will wait for her appointment on Monday with MD. No further questions or concerns at this time.

## 2021-10-19 NOTE — Telephone Encounter (Signed)
This nurse received a message from this patient stating that she has not been able to eat due to severe pain in chest and throat, lightheaded and unable to get warm for 2 days.  This nurse spoke with symptom management clinic PA and was advised to offer her an appointment for today to come in for fluids.  This nurse spoke to patient who stated that she has been able to get some water and Gatorade and the lightheadedness and she has warmed up a lot.  She states the pain is still severe but she does understand that it is due to the radiation.  She denied the [facility] appointment and stated that she will wait for her appointment on Monday with MD. No further questions or concerns at this time.

## 2021-10-22 ENCOUNTER — Encounter: Payer: Self-pay | Admitting: *Deleted

## 2021-10-22 ENCOUNTER — Inpatient Hospital Stay: Payer: Medicaid Other

## 2021-10-22 ENCOUNTER — Other Ambulatory Visit (HOSPITAL_COMMUNITY): Payer: Self-pay

## 2021-10-22 ENCOUNTER — Inpatient Hospital Stay (HOSPITAL_BASED_OUTPATIENT_CLINIC_OR_DEPARTMENT_OTHER): Payer: Medicaid Other | Admitting: Internal Medicine

## 2021-10-22 ENCOUNTER — Encounter: Payer: Self-pay | Admitting: Internal Medicine

## 2021-10-22 ENCOUNTER — Ambulatory Visit: Admission: RE | Admit: 2021-10-22 | Payer: Medicaid Other | Source: Ambulatory Visit

## 2021-10-22 ENCOUNTER — Other Ambulatory Visit: Payer: Self-pay | Admitting: Radiation Oncology

## 2021-10-22 ENCOUNTER — Other Ambulatory Visit: Payer: Self-pay

## 2021-10-22 VITALS — BP 101/79 | HR 115 | Temp 98.0°F | Resp 19 | Ht 68.0 in | Wt 171.8 lb

## 2021-10-22 DIAGNOSIS — C3411 Malignant neoplasm of upper lobe, right bronchus or lung: Secondary | ICD-10-CM

## 2021-10-22 LAB — CBC WITH DIFFERENTIAL (CANCER CENTER ONLY)
Abs Immature Granulocytes: 0.24 10*3/uL — ABNORMAL HIGH (ref 0.00–0.07)
Basophils Absolute: 0 10*3/uL (ref 0.0–0.1)
Basophils Relative: 1 %
Eosinophils Absolute: 0 10*3/uL (ref 0.0–0.5)
Eosinophils Relative: 1 %
HCT: 35.9 % — ABNORMAL LOW (ref 36.0–46.0)
Hemoglobin: 12.5 g/dL (ref 12.0–15.0)
Immature Granulocytes: 8 %
Lymphocytes Relative: 25 %
Lymphs Abs: 0.8 10*3/uL (ref 0.7–4.0)
MCH: 33.1 pg (ref 26.0–34.0)
MCHC: 34.8 g/dL (ref 30.0–36.0)
MCV: 95 fL (ref 80.0–100.0)
Monocytes Absolute: 0.8 10*3/uL (ref 0.1–1.0)
Monocytes Relative: 25 %
Neutro Abs: 1.3 10*3/uL — ABNORMAL LOW (ref 1.7–7.7)
Neutrophils Relative %: 40 %
Platelet Count: 390 10*3/uL (ref 150–400)
RBC: 3.78 MIL/uL — ABNORMAL LOW (ref 3.87–5.11)
RDW: 13.5 % (ref 11.5–15.5)
WBC Count: 3.1 10*3/uL — ABNORMAL LOW (ref 4.0–10.5)
nRBC: 0 % (ref 0.0–0.2)

## 2021-10-22 LAB — CMP (CANCER CENTER ONLY)
ALT: 9 U/L (ref 0–44)
AST: 11 U/L — ABNORMAL LOW (ref 15–41)
Albumin: 3.7 g/dL (ref 3.5–5.0)
Alkaline Phosphatase: 79 U/L (ref 38–126)
Anion gap: 11 (ref 5–15)
BUN: 12 mg/dL (ref 6–20)
CO2: 19 mmol/L — ABNORMAL LOW (ref 22–32)
Calcium: 10.1 mg/dL (ref 8.9–10.3)
Chloride: 107 mmol/L (ref 98–111)
Creatinine: 0.72 mg/dL (ref 0.44–1.00)
GFR, Estimated: >60 mL/min (ref >60)
Glucose, Bld: 99 mg/dL (ref 70–99)
Potassium: 3.6 mmol/L (ref 3.5–5.1)
Sodium: 137 mmol/L (ref 135–145)
Total Bilirubin: 0.4 mg/dL (ref 0.3–1.2)
Total Protein: 8 g/dL (ref 6.5–8.1)

## 2021-10-22 LAB — MAGNESIUM: Magnesium: 1.5 mg/dL — ABNORMAL LOW (ref 1.7–2.4)

## 2021-10-22 MED ORDER — SODIUM CHLORIDE 0.9 % IV SOLN: 2.0000 g | Freq: Once | INTRAVENOUS | Status: DC

## 2021-10-22 MED ORDER — SODIUM CHLORIDE 0.9 % IV SOLN: Freq: Once | INTRAVENOUS | Status: AC

## 2021-10-22 MED ORDER — ONDANSETRON HCL 4 MG/2ML IJ SOLN
8.0000 mg | Freq: Once | INTRAMUSCULAR | Status: AC
  Administered 2021-10-22: 14:00:00 8 mg via INTRAVENOUS
  Filled 2021-10-22: qty 4

## 2021-10-22 MED ORDER — XTAMPZA ER 18 MG PO C12A
18.0000 mg | EXTENDED_RELEASE_CAPSULE | Freq: Two times a day (BID) | ORAL | 0 refills | Status: DC
  Filled 2021-10-22: qty 14, 7d supply, fill #0

## 2021-10-22 MED ORDER — SODIUM CHLORIDE 0.9 % IV SOLN: 8.0000 mg | Freq: Once | INTRAVENOUS | Status: DC

## 2021-10-22 MED ORDER — MAGNESIUM SULFATE 2 GM/50ML IV SOLN
2.0000 g | Freq: Once | INTRAVENOUS | Status: AC
  Administered 2021-10-22: 14:00:00 2 g via INTRAVENOUS
  Filled 2021-10-22: qty 50

## 2021-10-22 MED ORDER — OXYCODONE HCL 5 MG PO TABS
5.0000 mg | ORAL_TABLET | ORAL | 0 refills | Status: DC | PRN
  Filled 2021-10-22: qty 30, 5d supply, fill #0

## 2021-10-22 MED ORDER — XTAMPZA ER 18 MG PO C12A
18.0000 mg | EXTENDED_RELEASE_CAPSULE | Freq: Two times a day (BID) | ORAL | 0 refills | Status: DC
  Filled 2021-10-22: qty 28, 14d supply, fill #0

## 2021-10-22 NOTE — Patient Instructions (Signed)
°Hypomagnesemia °Hypomagnesemia is a condition in which the level of magnesium in the blood is too low. Magnesium is a mineral that is found in many foods. It is used in many different processes in the body. Hypomagnesemia can affect every organ in the body. In severe cases, it can cause life-threatening problems. °What are the causes? °This condition may be caused by: °Not getting enough magnesium in your diet or not having enough healthy foods to eat (malnutrition). °Problems with magnesium absorption in the intestines. °Dehydration. °Excessive use of alcohol. °Vomiting. °Severe or long-term (chronic) diarrhea. °Some medicines, including medicines that make you urinate more often (diuretics). °Certain diseases, such as kidney disease, diabetes, celiac disease, and overactive thyroid. °What are the signs or symptoms? °Symptoms of this condition include: °Loss of appetite, nausea, and vomiting. °Involuntary shaking or trembling of a body part (tremor). °Muscle weakness or tingling in the arms and legs. °Sudden tightening of muscles (muscle spasms). °Confusion. °Psychiatric issues, such as: °Depression and irritability. °Psychosis. °A feeling of fluttering of the heart (palpitations). °Seizures. °These symptoms are more severe if magnesium levels drop suddenly. °How is this diagnosed? °This condition may be diagnosed based on: °Your symptoms and medical history. °A physical exam. °Blood and urine tests. °How is this treated? °Treatment depends on the cause and the severity of the condition. It may be treated by: °Taking a magnesium supplement. This can be taken in pill form. If the condition is severe, magnesium is usually given through an IV. °Making changes to your diet. You may be directed to eat foods that have a lot of magnesium, such as green leafy vegetables, peas, beans, and nuts. °Not drinking alcohol. If you are struggling not to drink, ask your health care provider for help. °Follow these instructions at  home: °Eating and drinking °  °Make sure that your diet includes foods with magnesium. Foods that have a lot of magnesium in them include: °Green leafy vegetables, such as spinach and broccoli. °Beans and peas. °Nuts and seeds, such as almonds and sunflower seeds. °Whole grains, such as whole grain bread and fortified cereals. °Drink fluids that contain salts and minerals (electrolytes), such as sports drinks, when you are active. °Do not drink alcohol. °General instructions °Take over-the-counter and prescription medicines only as told by your health care provider. °Take magnesium supplements as directed if your health care provider tells you to take them. °Have your magnesium levels monitored as told by your health care provider. °Keep all follow-up visits. This is important. °Contact a health care provider if: °You get worse instead of better. °Your symptoms return. °Get help right away if: °You develop severe muscle weakness. °You have trouble breathing. °You feel that your heart is racing. °These symptoms may represent a serious problem that is an emergency. Do not wait to see if the symptoms will go away. Get medical help right away. Call your local emergency services (911 in the [place] Do not drive yourself to the hospital. °Summary °Hypomagnesemia is a condition in which the level of magnesium in the blood is too low. °Hypomagnesemia can affect every organ in the body. °Treatment may include eating more foods that contain magnesium, taking magnesium supplements, and not drinking alcohol. °Have your magnesium levels monitored as told by your health care provider. °This information is not intended to replace advice given to you by your health care provider. Make sure you discuss any questions you have with your health care provider. °Document Revised: 04/10/2021 Document Reviewed: 04/10/2021 °Elsevier Patient Education © 2022   Elsevier Inc. ° °

## 2021-10-22 NOTE — Progress Notes (Signed)
Oncology Nurse Navigator Documentation  Oncology Nurse Navigator Flowsheets 10/22/2021 09/12/2021  Abnormal Finding Date 09/03/2021 09/03/2021  Confirmed Diagnosis Date 09/21/2021 -  Diagnosis Status Confirmed Diagnosis Complete Additional Work Up  Planned Course of Treatment Chemo/Radiation Concurrent -  Phase of Treatment Radiation -  Chemotherapy Actual Start Date: 10/02/2021 -  Radiation Actual Start Date: 09/26/2021 -  Navigator Follow Up Date: 11/05/2021 09/14/2021  Navigator Follow Up Reason: Follow-up Appointment Appointment Review  Navigator Location [facility]  Navigator Encounter Type Clinic/MDC;Follow-up Appt Initial MedOnc  Treatment Initiated Date 09/26/2021 -  Patient Visit Type Follow-up;MedOnc Initial;MedOnc  Treatment Phase Treatment -  Barriers/Navigation Needs Education Coordination of Care;Education  Education Other Other;Smoking cessation  Interventions Psycho-Social Support;Education Coordination of Care;Psycho-Social Support;Education  Acuity Level 2-Minimal Needs (1-2 Barriers Identified) Level 3-Moderate Needs (3-4 Barriers Identified)  Coordination of Care - Appts  Education Method Verbal Written;Verbal  Time Spent with Patient 28 83

## 2021-10-22 NOTE — Progress Notes (Signed)
[facility] Telephone:([phone]   [phone]  OFFICE PROGRESS NOTE  [name], PA-C [place] [address]  DIAGNOSIS: Limited stage (T3, N2, M0) small cell lung cancer presented with right hilar/suprahilar mass with occlusion of the right upper lobe bronchus in addition to right paratracheal, right infra hilar and subcarinal lymphadenopathy as well as additional right upper lobe and right apical pulmonary nodules.  This was diagnosed in October 2022.  PRIOR THERAPY: None  CURRENT THERAPY: Systemic chemotherapy with cisplatin 80 Mg/M2 on day 1 and etoposide 100 Mg/M2 on days 1, 2 and 3 every 3 weeks.  First dose October 02, 2021.  This will be concurrent with radiotherapy under the care of Dr. Sondra Come.  Status post 1 cycle.  INTERVAL HISTORY: [name] 50 y.o. female returns to the clinic today for follow-up visit accompanied by her mother.  The patient is feeling fine today except for the odynophagia from the radiotherapy.  She denied having any current chest pain and she has significant improvement of her breathing.  She denied having any hemoptysis but continues to have mild cough.  She has no nausea, vomiting, diarrhea or constipation.  She has no headache or visual changes.  She denied having any significant fever or chills.  The patient had a PET scan for staging of her disease and she is here for evaluation and discussion of her scan results and treatment options.   MEDICAL HISTORY: Past Medical History:  Diagnosis Date   Ankle sprain    left   Anxiety    treated for panic attacks in the past.   Asthma    COPD (chronic obstructive pulmonary disease) (HCC)    Depression    Irregular heart rate    Lung cancer (HCC)    Renal disorder    kidney infections    ALLERGIES:  is allergic to naproxen and bactrim [sulfamethoxazole-trimethoprim].  MEDICATIONS:  Current Outpatient Medications  Medication Sig Dispense  Refill   albuterol (PROVENTIL HFA;VENTOLIN HFA) 108 (90 Base) MCG/ACT inhaler Inhale 2 puffs into the lungs every 6 (six) hours as needed for wheezing or shortness of breath. (Patient not taking: Reported on 09/15/2021) 1 Inhaler 2   ALPRAZolam (XANAX) 0.25 MG tablet Take 1 tablet (0.25 mg total) by mouth 3 (three) times daily. As needed for anxiety 30 tablet 0   lidocaine (XYLOCAINE) 2 % solution Use as directed 15 mLs in the mouth or throat as needed for mouth pain. Swallow ~ 20 minutes prior to meals, dilute as per writtten instructions 100 mL 2   magnesium oxide (MAG-OX) 400 (240 Mg) MG tablet Take 1 tablet (400 mg total) by mouth daily. 30 tablet 2   ondansetron (ZOFRAN ODT) 8 MG disintegrating tablet Dissolve 1 tablet (8 mg total) by mouth every 8 (eight) hours as needed for nausea or vomiting. 30 tablet 2   oxyCODONE-acetaminophen (PERCOCET/ROXICET) 5-325 MG tablet Take 1 tablet by mouth every 8 (eight) hours as needed for severe pain. 30 tablet 0   prochlorperazine (COMPAZINE) 10 MG tablet Take 1 tablet (10 mg total) by mouth every 6 (six) hours as needed for nausea or vomiting. 30 tablet 1   sucralfate (CARAFATE) 1 g tablet Take 1 tablet (1 g total) by mouth 4 (four) times daily -  with meals and at bedtime. Crush and dissolve in 10 mL of warm water prior to swallowing 120 tablet 1   No current facility-administered medications  for this visit.    SURGICAL HISTORY:  Past Surgical History:  Procedure Laterality Date   APPENDECTOMY     BRONCHIAL BIOPSY  09/21/2021   Procedure: BRONCHIAL BIOPSIES;  Surgeon: Collene Gobble, MD;  Location: [facility] ENDOSCOPY;  Service: Pulmonary;;   BRONCHIAL BRUSHINGS  09/21/2021   Procedure: BRONCHIAL BRUSHINGS;  Surgeon: Collene Gobble, MD;  Location: [facility] ENDOSCOPY;  Service: Pulmonary;;   BRONCHIAL NEEDLE ASPIRATION BIOPSY  09/21/2021   Procedure: BRONCHIAL NEEDLE ASPIRATION BIOPSIES;  Surgeon: Collene Gobble, MD;  Location: [facility] ENDOSCOPY;  Service:  Pulmonary;;   LUMBAR LAMINECTOMY N/A 05/13/2018   Procedure: LEFT LUMBAR FOUR-FIVE MICRODISCECTOMY;  Surgeon: Marybelle Killings, MD;  Location: [facility];  Service: Orthopedics;  Laterality: N/A;   TUBAL LIGATION     TUBAL LIGATION  1998   VIDEO BRONCHOSCOPY WITH ENDOBRONCHIAL ULTRASOUND N/A 09/21/2021   Procedure: VIDEO BRONCHOSCOPY WITH ENDOBRONCHIAL ULTRASOUND;  Surgeon: Collene Gobble, MD;  Location: [facility] ENDOSCOPY;  Service: Pulmonary;  Laterality: N/A;    REVIEW OF SYSTEMS:  Constitutional: positive for anorexia, fatigue, and weight loss Eyes: negative Ears, nose, mouth, throat, and face: negative Respiratory: positive for cough, dyspnea on exertion, and pleurisy/chest pain Cardiovascular: negative Gastrointestinal: positive for odynophagia Genitourinary:negative Integument/breast: negative Hematologic/lymphatic: negative Musculoskeletal:negative Neurological: negative Behavioral/Psych: negative Endocrine: negative Allergic/Immunologic: negative   PHYSICAL EXAMINATION: General appearance: alert, cooperative, fatigued, and no distress Head: Normocephalic, without obvious abnormality, atraumatic Neck: no adenopathy, no JVD, supple, symmetrical, trachea midline, and thyroid not enlarged, symmetric, no tenderness/mass/nodules Lymph nodes: Cervical, supraclavicular, and axillary nodes normal. Resp: clear to auscultation bilaterally Back: symmetric, no curvature. ROM normal. No CVA tenderness. Cardio: regular rate and rhythm, S1, S2 normal, no murmur, click, rub or gallop GI: soft, non-tender; bowel sounds normal; no masses,  no organomegaly Extremities: extremities normal, atraumatic, no cyanosis or edema Neurologic: Alert and oriented X 3, normal strength and tone. Normal symmetric reflexes. Normal coordination and gait  ECOG PERFORMANCE STATUS: 1 - Symptomatic but completely ambulatory  Blood pressure 101/79, pulse (!) 115, temperature 98 F (36.7 C), temperature source Tympanic, resp.  rate 19, height 5\' 8"  (1.727 m), weight 171 lb 12.8 oz (77.9 kg), last menstrual period 04/24/2018, SpO2 98 %.  LABORATORY DATA: Lab Results  Component Value Date   WBC 3.1 (L) 10/22/2021   HGB 12.5 10/22/2021   HCT 35.9 (L) 10/22/2021   MCV 95.0 10/22/2021   PLT 390 10/22/2021      Chemistry      Component Value Date/Time   NA 137 10/22/2021 0818   NA 139 02/18/2017 0932   K 3.6 10/22/2021 0818   CL 107 10/22/2021 0818   CO2 19 (L) 10/22/2021 0818   BUN 12 10/22/2021 0818   BUN 11 02/18/2017 0932   CREATININE 0.72 10/22/2021 0818      Component Value Date/Time   CALCIUM 10.1 10/22/2021 0818   ALKPHOS 79 10/22/2021 0818   AST 11 (L) 10/22/2021 0818   ALT 9 10/22/2021 0818   BILITOT 0.4 10/22/2021 0818       RADIOGRAPHIC STUDIES: NM PET Image Initial (PI) Skull Base To Thigh  Result Date: 10/15/2021 CLINICAL DATA:  Initial treatment strategy for non-small cell lung cancer. EXAM: NUCLEAR MEDICINE PET SKULL BASE TO THIGH TECHNIQUE: 8.6 mCi F-18 FDG was injected intravenously. Full-ring PET imaging was performed from the skull base to thigh after the radiotracer. CT data was obtained and used for attenuation correction and anatomic localization. Fasting blood glucose: 98 mg/dl COMPARISON:  CT  scan 09/15/2021 FINDINGS: Mediastinal blood pool activity: SUV max 2.0 Liver activity: SUV max NA NECK: No hypermetabolic lymph nodes in the neck. Incidental CT findings: none CHEST: The right hilar mass is much smaller when compared to the prior CT scan suggesting a good response to chemo radiation therapy. The mass measures approximately 2 cm and previously measured 6 cm. The mass is hypermetabolic with SUV max of 2.11. Adjacent hilar and mediastinal adenopathy is also much smaller. The right paratracheal node measures 15 mm and previously measured 36 mm. SUV max is 6.57. The somewhat triangular shaped right upper lobe nodule posteriorly measures 13 mm and previously measured 19 mm. SUV max  is 2.92 and this is likely an improving metastatic focus. A second small nodule in the right lung apex is also smaller. No new or progressive lung nodules. No breast masses, supraclavicular or axillary adenopathy. Mild hypermetabolism involving the esophagus is likely radiation related. Incidental CT findings: Stable significant/advanced underlying emphysematous changes. ABDOMEN/PELVIS: No findings suspicious for abdominal/pelvic metastatic disease. No hepatic or adrenal gland lesions are identified. No enlarged or hypermetabolic abdominal/pelvic lymph nodes. Incidental CT findings: none SKELETON: There are several hypermetabolic bone lesions consistent with metastatic disease. There appeared to be healing pathologic fractures involving the right seventh and eighth anterolateral ribs. SUV max is 5.0. There is also a small hypermetabolic focus involving the left transverse process of T5. Left iliac and left sacral bone lesions are also noted. Incidental CT findings: none IMPRESSION: 1. Hypermetabolic right upper lobe/perihilar mass and adjacent hypermetabolic adenopathy consistent with known non-small cell lung cancer. Significant interval improvement since the prior CT scan suggesting a good response to chemo radiation. 2. Right apical nodules are smaller also. 3. No findings for abdominal/pelvic metastatic disease. 4. Osseous metastatic disease as detailed above. Electronically Signed   By: Marijo Sanes M.D.   On: 10/15/2021 09:08    ASSESSMENT AND PLAN: This is a very pleasant 50 years old white female recently diagnosed with limited stage (T3, N2, MX) small cell lung cancer presented with right hilar/suprahilar mass with occlusion of the right upper lobe bronchus in addition to right paratracheal, right infrahilar and subcarinal lymphadenopathy as well as right upper lobe and right apical pulmonary nodules diagnosed in October 2022. The patient had MRI of the brain performed recently that showed no evidence  of metastatic disease to the brain.   The patient is started systemic chemotherapy with cisplatin 80 Mg/M2 on day 1 and 2 etoposide 100 Mg/M2 on days 1, 2 and 3 status post 1 cycle.  This is concurrent with radiotherapy. She has been tolerating this treatment well except for the odynophagia from the radiotherapy.  She will receive prescription of Hycet by Dr. Sondra Come today. She had a PET scan performed recently for staging of her disease and it showed significant improvement in her disease after the first cycle of her chemotherapy.  There was no evidence of metastatic disease outside the chest. I recommended for the patient to proceed with cycle #2 tomorrow as planned. For the odynophagia, dehydration and lack of appetite, I will arrange for the patient to receive 1 L of normal saline with Zofran 8 mg IV as well as magnesium sulfate 2 g IV today. The patient will come back for follow-up visit in 3 weeks for evaluation before starting cycle #3. She was advised to call immediately if she has any other concerning symptoms in the interval The patient voices understanding of current disease status and treatment options and is in  agreement with the current care plan.  All questions were answered. The patient knows to call the clinic with any problems, questions or concerns. We can certainly see the patient much sooner if necessary.  The total time spent in the appointment was 30 minutes.  Disclaimer: This note was dictated with voice recognition software. Similar sounding words can inadvertently be transcribed and may not be corrected upon review.

## 2021-10-23 ENCOUNTER — Ambulatory Visit: Payer: Medicaid Other

## 2021-10-23 ENCOUNTER — Other Ambulatory Visit (HOSPITAL_COMMUNITY): Payer: Self-pay

## 2021-10-23 ENCOUNTER — Inpatient Hospital Stay: Payer: Medicaid Other

## 2021-10-23 VITALS — BP 117/82 | HR 115 | Temp 98.1°F | Resp 18

## 2021-10-23 DIAGNOSIS — C3411 Malignant neoplasm of upper lobe, right bronchus or lung: Secondary | ICD-10-CM

## 2021-10-23 MED ORDER — SODIUM CHLORIDE 0.9 % IV SOLN
80.0000 mg/m2 | Freq: Once | INTRAVENOUS | Status: AC
  Administered 2021-10-23: 155 mg via INTRAVENOUS
  Filled 2021-10-23: qty 155

## 2021-10-23 MED ORDER — SODIUM CHLORIDE 0.9 % IV SOLN
100.0000 mg/m2 | Freq: Once | INTRAVENOUS | Status: AC
  Administered 2021-10-23: 190 mg via INTRAVENOUS
  Filled 2021-10-23: qty 9.5

## 2021-10-23 MED ORDER — PALONOSETRON HCL INJECTION 0.25 MG/5ML
0.2500 mg | Freq: Once | INTRAVENOUS | Status: AC
  Administered 2021-10-23: 0.25 mg via INTRAVENOUS

## 2021-10-23 MED ORDER — SODIUM CHLORIDE 0.9 % IV SOLN
150.0000 mg | Freq: Once | INTRAVENOUS | Status: AC
  Administered 2021-10-23: 150 mg via INTRAVENOUS
  Filled 2021-10-23: qty 150

## 2021-10-23 MED ORDER — MAGNESIUM SULFATE 2 GM/50ML IV SOLN
2.0000 g | Freq: Once | INTRAVENOUS | Status: AC
  Administered 2021-10-23: 2 g via INTRAVENOUS

## 2021-10-23 MED ORDER — SODIUM CHLORIDE 0.9 % IV SOLN
10.0000 mg | Freq: Once | INTRAVENOUS | Status: AC
  Administered 2021-10-23: 10 mg via INTRAVENOUS
  Filled 2021-10-23: qty 10

## 2021-10-23 MED ORDER — SODIUM CHLORIDE 0.9 % IV SOLN
Freq: Once | INTRAVENOUS | Status: AC
Start: 2021-10-23 — End: 2021-10-23

## 2021-10-23 MED ORDER — POTASSIUM CHLORIDE IN NACL 20-0.9 MEQ/L-% IV SOLN
Freq: Once | INTRAVENOUS | Status: AC
  Filled 2021-10-23: qty 1000

## 2021-10-23 MED FILL — Dexamethasone Sodium Phosphate Inj 100 MG/10ML: INTRAMUSCULAR | Qty: 1 | Status: AC

## 2021-10-23 NOTE — Progress Notes (Signed)
Ok to proceed with treatment with anc of 1.3 and elevated heart rate per Dr. Julien Nordmann. Ok to treat with urine output of 136ml prior to premeds per Doctor as well

## 2021-10-23 NOTE — Patient Instructions (Signed)
[facility]COL[facility]  Discharge Instructions: Thank you for choosing [facility] to provide your oncology and hematology care.   If you have a lab appointment with the [facility], please go directly to the [facility] and check in at the registration area.   Wear comfortable clothing and clothing appropriate for easy access to any Portacath or PICC line.   We strive to give you quality time with your provider. You may need to reschedule your appointment if you arrive late (15 or more minutes).  Arriving late affects you and other patients whose appointments are after yours.  Also, if you miss three or more appointments without notifying the office, you may be dismissed from the clinic at the provider's discretion.      For prescription refill requests, have your pharmacy contact our office and allow 72 hours for refills to be completed.    Today you received the following chemotherapy and/or immunotherapy agents: Cisplatin and etoposide       To help prevent nausea and vomiting after your treatment, we encourage you to take your nausea medication as directed.  BELOW ARE SYMPTOMS THAT SHOULD BE REPORTED IMMEDIATELY: *FEVER GREATER THAN 100.4 F (38 C) OR HIGHER *CHILLS OR SWEATING *NAUSEA AND VOMITING THAT IS NOT CONTROLLED WITH YOUR NAUSEA MEDICATION *UNUSUAL SHORTNESS OF BREATH *UNUSUAL BRUISING OR BLEEDING *URINARY PROBLEMS (pain or burning when urinating, or frequent urination) *BOWEL PROBLEMS (unusual diarrhea, constipation, pain near the anus) TENDERNESS IN MOUTH AND THROAT WITH OR WITHOUT PRESENCE OF ULCERS (sore throat, sores in mouth, or a toothache) UNUSUAL RASH, SWELLING OR PAIN  UNUSUAL VAGINAL DISCHARGE OR ITCHING   Items with * indicate a potential emergency and should be followed up as soon as possible or go to the Emergency Department if any problems should occur.  Please show the CHEMOTHERAPY ALERT CARD or IMMUNOTHERAPY ALERT CARD  at check-in to the Emergency Department and triage nurse.  Should you have questions after your visit or need to cancel or reschedule your appointment, please contact [facility]  Dept: [phone]  and follow the prompts.  Office hours are 8:00 a.m. to 4:30 p.m. Monday - Friday. Please note that voicemails left after 4:00 p.m. may not be returned until the following business day.  We are closed weekends and major holidays. You have access to a nurse at all times for urgent questions. Please call the main number to the clinic Dept: [phone] and follow the prompts.   For any non-urgent questions, you may also contact your provider using MyChart. We now offer e-Visits for anyone 50 and older to request care online for non-urgent symptoms. For details visit [email or web address].   Also download the MyChart app! Go to the app store, search "MyChart", open the app, select [facility], and log in with your MyChart username and password.  Due to Covid, a mask is required upon entering the hospital/clinic. If you do not have a mask, one will be given to you upon arrival. For doctor visits, patients may have 1 support person aged 50 or older with them. For treatment visits, patients cannot have anyone with them due to current Covid guidelines and our immunocompromised population.

## 2021-10-24 ENCOUNTER — Other Ambulatory Visit: Payer: Self-pay

## 2021-10-24 ENCOUNTER — Ambulatory Visit: Payer: Medicaid Other

## 2021-10-24 ENCOUNTER — Inpatient Hospital Stay: Payer: Medicaid Other

## 2021-10-24 VITALS — BP 118/78 | HR 98 | Temp 98.2°F | Resp 18

## 2021-10-24 DIAGNOSIS — C3411 Malignant neoplasm of upper lobe, right bronchus or lung: Secondary | ICD-10-CM | POA: Diagnosis not present

## 2021-10-24 MED ORDER — SODIUM CHLORIDE 0.9 % IV SOLN: Freq: Once | INTRAVENOUS | Status: AC

## 2021-10-24 MED ORDER — SODIUM CHLORIDE 0.9 % IV SOLN
10.0000 mg | Freq: Once | INTRAVENOUS | Status: AC
  Administered 2021-10-24: 10 mg via INTRAVENOUS
  Filled 2021-10-24: qty 10

## 2021-10-24 MED ORDER — SODIUM CHLORIDE 0.9 % IV SOLN
100.0000 mg/m2 | Freq: Once | INTRAVENOUS | Status: AC
  Administered 2021-10-24: 190 mg via INTRAVENOUS
  Filled 2021-10-24: qty 9.5

## 2021-10-24 MED FILL — Dexamethasone Sodium Phosphate Inj 100 MG/10ML: INTRAMUSCULAR | Qty: 1 | Status: AC

## 2021-10-25 ENCOUNTER — Inpatient Hospital Stay: Payer: Medicaid Other | Attending: Internal Medicine

## 2021-10-25 ENCOUNTER — Ambulatory Visit
Admission: RE | Admit: 2021-10-25 | Discharge: 2021-10-25 | Disposition: A | Discharge status: Home / Self Care | Payer: Medicaid Other | Source: Ambulatory Visit | Attending: Radiation Oncology | Admitting: Radiation Oncology

## 2021-10-25 VITALS — BP 140/85 | HR 97 | Temp 98.0°F | Resp 17 | Ht 68.0 in | Wt 179.8 lb

## 2021-10-25 DIAGNOSIS — Z79899 Other long term (current) drug therapy: Secondary | ICD-10-CM | POA: Insufficient documentation

## 2021-10-25 DIAGNOSIS — R590 Localized enlarged lymph nodes: Secondary | ICD-10-CM | POA: Insufficient documentation

## 2021-10-25 DIAGNOSIS — E860 Dehydration: Secondary | ICD-10-CM | POA: Insufficient documentation

## 2021-10-25 DIAGNOSIS — R634 Abnormal weight loss: Secondary | ICD-10-CM | POA: Insufficient documentation

## 2021-10-25 DIAGNOSIS — Z5111 Encounter for antineoplastic chemotherapy: Secondary | ICD-10-CM | POA: Diagnosis present

## 2021-10-25 DIAGNOSIS — R5383 Other fatigue: Secondary | ICD-10-CM | POA: Diagnosis not present

## 2021-10-25 DIAGNOSIS — R1310 Dysphagia, unspecified: Secondary | ICD-10-CM | POA: Diagnosis not present

## 2021-10-25 DIAGNOSIS — C3411 Malignant neoplasm of upper lobe, right bronchus or lung: Secondary | ICD-10-CM | POA: Insufficient documentation

## 2021-10-25 DIAGNOSIS — R630 Anorexia: Secondary | ICD-10-CM | POA: Diagnosis not present

## 2021-10-25 MED ORDER — SODIUM CHLORIDE 0.9 % IV SOLN
10.0000 mg | Freq: Once | INTRAVENOUS | Status: AC
  Administered 2021-10-25: 10 mg via INTRAVENOUS
  Filled 2021-10-25: qty 10

## 2021-10-25 MED ORDER — SODIUM CHLORIDE 0.9 % IV SOLN
100.0000 mg/m2 | Freq: Once | INTRAVENOUS | Status: AC
  Administered 2021-10-25: 190 mg via INTRAVENOUS
  Filled 2021-10-25: qty 9.5

## 2021-10-25 MED ORDER — SODIUM CHLORIDE 0.9 % IV SOLN: Freq: Once | INTRAVENOUS | Status: AC

## 2021-10-25 NOTE — Patient Instructions (Signed)
[facility] ONCOLOG[facility]  Discharge Instructions: Thank you for choosing [facility] to provide your oncology and hematology care.   If you have a lab appointment with the [facility], please go directly to the [facility] and check in at the registration area.   Wear comfortable clothing and clothing appropriate for easy access to any Portacath or PICC line.   We strive to give you quality time with your provider. You may need to reschedule your appointment if you arrive late (15 or more minutes).  Arriving late affects you and other patients whose appointments are after yours.  Also, if you miss three or more appointments without notifying the office, you may be dismissed from the clinic at the provider's discretion.      For prescription refill requests, have your pharmacy contact our office and allow 72 hours for refills to be completed.    Today you received the following chemotherapy and/or immunotherapy agent: Etoposide.   To help prevent nausea and vomiting after your treatment, we encourage you to take your nausea medication as directed.  BELOW ARE SYMPTOMS THAT SHOULD BE REPORTED IMMEDIATELY: *FEVER GREATER THAN 100.4 F (38 C) OR HIGHER *CHILLS OR SWEATING *NAUSEA AND VOMITING THAT IS NOT CONTROLLED WITH YOUR NAUSEA MEDICATION *UNUSUAL SHORTNESS OF BREATH *UNUSUAL BRUISING OR BLEEDING *URINARY PROBLEMS (pain or burning when urinating, or frequent urination) *BOWEL PROBLEMS (unusual diarrhea, constipation, pain near the anus) TENDERNESS IN MOUTH AND THROAT WITH OR WITHOUT PRESENCE OF ULCERS (sore throat, sores in mouth, or a toothache) UNUSUAL RASH, SWELLING OR PAIN  UNUSUAL VAGINAL DISCHARGE OR ITCHING   Items with * indicate a potential emergency and should be followed up as soon as possible or go to the Emergency Department if any problems should occur.  Please show the CHEMOTHERAPY ALERT CARD or IMMUNOTHERAPY ALERT CARD at check-in to the  Emergency Department and triage nurse.  Should you have questions after your visit or need to cancel or reschedule your appointment, please contact [facility]  Dept: [phone]  and follow the prompts.  Office hours are 8:00 a.m. to 4:30 p.m. Monday - Friday. Please note that voicemails left after 4:00 p.m. may not be returned until the following business day.  We are closed weekends and major holidays. You have access to a nurse at all times for urgent questions. Please call the main number to the clinic Dept: [phone] and follow the prompts.   For any non-urgent questions, you may also contact your provider using MyChart. We now offer e-Visits for anyone 51 and older to request care online for non-urgent symptoms. For details visit [email or web address].   Also download the MyChart app! Go to the app store, search "MyChart", open the app, select [facility], and log in with your MyChart username and password.  Due to Covid, a mask is required upon entering the hospital/clinic. If you do not have a mask, one will be given to you upon arrival. For doctor visits, patients may have 1 support person aged 60 or older with them. For treatment visits, patients cannot have anyone with them due to current Covid guidelines and our immunocompromised population.

## 2021-10-26 ENCOUNTER — Other Ambulatory Visit: Payer: Self-pay

## 2021-10-26 ENCOUNTER — Ambulatory Visit
Admission: RE | Admit: 2021-10-26 | Discharge: 2021-10-26 | Disposition: A | Discharge status: Home / Self Care | Payer: Medicaid Other | Source: Ambulatory Visit | Attending: Radiation Oncology | Admitting: Radiation Oncology

## 2021-10-26 ENCOUNTER — Other Ambulatory Visit (HOSPITAL_COMMUNITY): Payer: Self-pay

## 2021-10-26 DIAGNOSIS — Z5111 Encounter for antineoplastic chemotherapy: Secondary | ICD-10-CM | POA: Diagnosis not present

## 2021-10-27 ENCOUNTER — Ambulatory Visit: Payer: Medicaid Other

## 2021-10-27 ENCOUNTER — Other Ambulatory Visit: Payer: Self-pay | Admitting: Radiation Oncology

## 2021-10-27 MED ORDER — ALPRAZOLAM 0.25 MG PO TABS: 0.2500 mg | ORAL_TABLET | Freq: Three times a day (TID) | ORAL | 0 refills | Status: DC

## 2021-10-28 ENCOUNTER — Ambulatory Visit: Payer: Medicaid Other

## 2021-10-29 ENCOUNTER — Other Ambulatory Visit: Payer: Self-pay

## 2021-10-29 ENCOUNTER — Ambulatory Visit
Admission: RE | Admit: 2021-10-29 | Discharge: 2021-10-29 | Disposition: A | Discharge status: Home / Self Care | Payer: Medicaid Other | Source: Ambulatory Visit | Attending: Radiation Oncology | Admitting: Radiation Oncology

## 2021-10-29 DIAGNOSIS — Z5111 Encounter for antineoplastic chemotherapy: Secondary | ICD-10-CM | POA: Diagnosis not present

## 2021-10-30 ENCOUNTER — Inpatient Hospital Stay: Payer: Medicaid Other

## 2021-10-30 ENCOUNTER — Ambulatory Visit
Admission: RE | Admit: 2021-10-30 | Discharge: 2021-10-30 | Disposition: A | Discharge status: Home / Self Care | Payer: Medicaid Other | Source: Ambulatory Visit | Attending: Radiation Oncology | Admitting: Radiation Oncology

## 2021-10-30 ENCOUNTER — Other Ambulatory Visit: Payer: Self-pay | Admitting: Radiation Oncology

## 2021-10-30 ENCOUNTER — Other Ambulatory Visit: Payer: Self-pay | Admitting: Internal Medicine

## 2021-10-30 ENCOUNTER — Other Ambulatory Visit (HOSPITAL_COMMUNITY): Payer: Self-pay

## 2021-10-30 DIAGNOSIS — Z5111 Encounter for antineoplastic chemotherapy: Secondary | ICD-10-CM | POA: Diagnosis not present

## 2021-10-30 DIAGNOSIS — C3411 Malignant neoplasm of upper lobe, right bronchus or lung: Secondary | ICD-10-CM

## 2021-10-30 DIAGNOSIS — R112 Nausea with vomiting, unspecified: Secondary | ICD-10-CM

## 2021-10-30 LAB — CMP (CANCER CENTER ONLY)
ALT: 10 U/L (ref 0–44)
AST: 12 U/L — ABNORMAL LOW (ref 15–41)
Albumin: 3.6 g/dL (ref 3.5–5.0)
Alkaline Phosphatase: 72 U/L (ref 38–126)
Anion gap: 11 (ref 5–15)
BUN: 19 mg/dL (ref 6–20)
CO2: 24 mmol/L (ref 22–32)
Calcium: 9.2 mg/dL (ref 8.9–10.3)
Chloride: 104 mmol/L (ref 98–111)
Creatinine: 0.8 mg/dL (ref 0.44–1.00)
GFR, Estimated: >60 mL/min (ref >60)
Glucose, Bld: 99 mg/dL (ref 70–99)
Potassium: 3.4 mmol/L — ABNORMAL LOW (ref 3.5–5.1)
Sodium: 139 mmol/L (ref 135–145)
Total Bilirubin: 0.8 mg/dL (ref 0.3–1.2)
Total Protein: 7.6 g/dL (ref 6.5–8.1)

## 2021-10-30 LAB — CBC WITH DIFFERENTIAL (CANCER CENTER ONLY)
Abs Immature Granulocytes: 0.14 10*3/uL — ABNORMAL HIGH (ref 0.00–0.07)
Basophils Absolute: 0 10*3/uL (ref 0.0–0.1)
Basophils Relative: 0 %
Eosinophils Absolute: 0 10*3/uL (ref 0.0–0.5)
Eosinophils Relative: 0 %
HCT: 33.2 % — ABNORMAL LOW (ref 36.0–46.0)
Hemoglobin: 11.5 g/dL — ABNORMAL LOW (ref 12.0–15.0)
Immature Granulocytes: 2 %
Lymphocytes Relative: 9 %
Lymphs Abs: 0.6 10*3/uL — ABNORMAL LOW (ref 0.7–4.0)
MCH: 33.9 pg (ref 26.0–34.0)
MCHC: 34.6 g/dL (ref 30.0–36.0)
MCV: 97.9 fL (ref 80.0–100.0)
Monocytes Absolute: 0.1 10*3/uL (ref 0.1–1.0)
Monocytes Relative: 2 %
Neutro Abs: 6 10*3/uL (ref 1.7–7.7)
Neutrophils Relative %: 87 %
Platelet Count: 298 10*3/uL (ref 150–400)
RBC: 3.39 MIL/uL — ABNORMAL LOW (ref 3.87–5.11)
RDW: 13.8 % (ref 11.5–15.5)
WBC Count: 6.9 10*3/uL (ref 4.0–10.5)
nRBC: 0 % (ref 0.0–0.2)

## 2021-10-30 LAB — MAGNESIUM: Magnesium: 1.3 mg/dL — ABNORMAL LOW (ref 1.7–2.4)

## 2021-10-30 MED ORDER — MAGNESIUM OXIDE -MG SUPPLEMENT 400 (240 MG) MG PO TABS
400.0000 mg | ORAL_TABLET | Freq: Three times a day (TID) | ORAL | 2 refills | Status: DC
Start: 2021-10-30 — End: 2022-01-07

## 2021-10-30 MED ORDER — XTAMPZA ER 18 MG PO C12A
18.0000 mg | EXTENDED_RELEASE_CAPSULE | Freq: Two times a day (BID) | ORAL | 0 refills | Status: DC
  Filled 2021-10-30: qty 28, 14d supply, fill #0

## 2021-10-30 MED ORDER — OXYCODONE HCL 5 MG PO TABS
5.0000 mg | ORAL_TABLET | ORAL | 0 refills | Status: DC | PRN
  Filled 2021-10-30: qty 30, 5d supply, fill #0

## 2021-10-31 ENCOUNTER — Ambulatory Visit: Payer: Medicaid Other

## 2021-10-31 ENCOUNTER — Ambulatory Visit
Admission: RE | Admit: 2021-10-31 | Discharge: 2021-10-31 | Disposition: A | Discharge status: Home / Self Care | Payer: Medicaid Other | Source: Ambulatory Visit | Attending: Radiation Oncology | Admitting: Radiation Oncology

## 2021-10-31 ENCOUNTER — Other Ambulatory Visit: Payer: Self-pay

## 2021-10-31 ENCOUNTER — Other Ambulatory Visit (HOSPITAL_COMMUNITY): Payer: Self-pay

## 2021-10-31 DIAGNOSIS — Z5111 Encounter for antineoplastic chemotherapy: Secondary | ICD-10-CM | POA: Diagnosis not present

## 2021-11-01 ENCOUNTER — Ambulatory Visit: Payer: Medicaid Other

## 2021-11-02 ENCOUNTER — Ambulatory Visit
Admission: RE | Admit: 2021-11-02 | Discharge: 2021-11-02 | Disposition: A | Discharge status: Home / Self Care | Payer: Medicaid Other | Source: Ambulatory Visit | Attending: Radiation Oncology | Admitting: Radiation Oncology

## 2021-11-02 ENCOUNTER — Other Ambulatory Visit: Payer: Self-pay

## 2021-11-02 DIAGNOSIS — Z5111 Encounter for antineoplastic chemotherapy: Secondary | ICD-10-CM | POA: Diagnosis not present

## 2021-11-04 ENCOUNTER — Ambulatory Visit: Payer: Medicaid Other

## 2021-11-05 ENCOUNTER — Ambulatory Visit
Admission: RE | Admit: 2021-11-05 | Discharge: 2021-11-05 | Disposition: A | Discharge status: Home / Self Care | Payer: Medicaid Other | Source: Ambulatory Visit | Attending: Radiation Oncology | Admitting: Radiation Oncology

## 2021-11-05 ENCOUNTER — Other Ambulatory Visit: Payer: Self-pay

## 2021-11-05 DIAGNOSIS — Z5111 Encounter for antineoplastic chemotherapy: Secondary | ICD-10-CM | POA: Diagnosis not present

## 2021-11-06 ENCOUNTER — Ambulatory Visit: Payer: Medicaid Other

## 2021-11-06 ENCOUNTER — Encounter: Payer: Self-pay | Admitting: Internal Medicine

## 2021-11-06 ENCOUNTER — Ambulatory Visit
Admission: RE | Admit: 2021-11-06 | Discharge: 2021-11-06 | Disposition: A | Discharge status: Home / Self Care | Payer: Medicaid Other | Source: Ambulatory Visit | Attending: Radiation Oncology | Admitting: Radiation Oncology

## 2021-11-06 ENCOUNTER — Other Ambulatory Visit (HOSPITAL_COMMUNITY): Payer: Self-pay

## 2021-11-06 ENCOUNTER — Other Ambulatory Visit: Payer: Self-pay | Admitting: Radiation Oncology

## 2021-11-06 ENCOUNTER — Inpatient Hospital Stay: Payer: Medicaid Other

## 2021-11-06 DIAGNOSIS — C3411 Malignant neoplasm of upper lobe, right bronchus or lung: Secondary | ICD-10-CM

## 2021-11-06 DIAGNOSIS — Z5111 Encounter for antineoplastic chemotherapy: Secondary | ICD-10-CM | POA: Diagnosis not present

## 2021-11-06 LAB — CBC WITH DIFFERENTIAL (CANCER CENTER ONLY)
Abs Immature Granulocytes: 0.01 10*3/uL (ref 0.00–0.07)
Basophils Absolute: 0 10*3/uL (ref 0.0–0.1)
Basophils Relative: 0 %
Eosinophils Absolute: 0.1 10*3/uL (ref 0.0–0.5)
Eosinophils Relative: 4 %
HCT: 28.5 % — ABNORMAL LOW (ref 36.0–46.0)
Hemoglobin: 9.6 g/dL — ABNORMAL LOW (ref 12.0–15.0)
Immature Granulocytes: 1 %
Lymphocytes Relative: 33 %
Lymphs Abs: 0.5 10*3/uL — ABNORMAL LOW (ref 0.7–4.0)
MCH: 33.3 pg (ref 26.0–34.0)
MCHC: 33.7 g/dL (ref 30.0–36.0)
MCV: 99 fL (ref 80.0–100.0)
Monocytes Absolute: 0.2 10*3/uL (ref 0.1–1.0)
Monocytes Relative: 13 %
Neutro Abs: 0.8 10*3/uL — ABNORMAL LOW (ref 1.7–7.7)
Neutrophils Relative %: 49 %
Platelet Count: 77 10*3/uL — ABNORMAL LOW (ref 150–400)
RBC: 2.88 MIL/uL — ABNORMAL LOW (ref 3.87–5.11)
RDW: 13.6 % (ref 11.5–15.5)
WBC Count: 1.6 10*3/uL — ABNORMAL LOW (ref 4.0–10.5)
nRBC: 0 % (ref 0.0–0.2)

## 2021-11-06 LAB — CMP (CANCER CENTER ONLY)
ALT: 12 U/L (ref 0–44)
AST: 13 U/L — ABNORMAL LOW (ref 15–41)
Albumin: 3.5 g/dL (ref 3.5–5.0)
Alkaline Phosphatase: 90 U/L (ref 38–126)
Anion gap: 8 (ref 5–15)
BUN: 12 mg/dL (ref 6–20)
CO2: 22 mmol/L (ref 22–32)
Calcium: 9.2 mg/dL (ref 8.9–10.3)
Chloride: 108 mmol/L (ref 98–111)
Creatinine: 0.85 mg/dL (ref 0.44–1.00)
GFR, Estimated: >60 mL/min (ref >60)
Glucose, Bld: 102 mg/dL — ABNORMAL HIGH (ref 70–99)
Potassium: 3.2 mmol/L — ABNORMAL LOW (ref 3.5–5.1)
Sodium: 138 mmol/L (ref 135–145)
Total Bilirubin: 0.3 mg/dL (ref 0.3–1.2)
Total Protein: 7.2 g/dL (ref 6.5–8.1)

## 2021-11-06 LAB — MAGNESIUM: Magnesium: 1.3 mg/dL — ABNORMAL LOW (ref 1.7–2.4)

## 2021-11-06 MED ORDER — XTAMPZA ER 18 MG PO C12A
18.0000 mg | EXTENDED_RELEASE_CAPSULE | Freq: Two times a day (BID) | ORAL | 0 refills | Status: DC
Start: 2021-11-06 — End: 2021-12-27
  Filled 2021-11-06 – 2021-11-14 (×2): qty 28, 14d supply, fill #0

## 2021-11-06 MED ORDER — OXYCODONE HCL 5 MG PO TABS
5.0000 mg | ORAL_TABLET | ORAL | 0 refills | Status: DC | PRN
  Filled 2021-11-06: qty 30, 5d supply, fill #0

## 2021-11-06 MED ORDER — SUCRALFATE 1 G PO TABS: 1.0000 g | ORAL_TABLET | Freq: Three times a day (TID) | ORAL | 1 refills | Status: DC

## 2021-11-07 ENCOUNTER — Other Ambulatory Visit: Payer: Self-pay

## 2021-11-07 ENCOUNTER — Other Ambulatory Visit (HOSPITAL_COMMUNITY): Payer: Self-pay

## 2021-11-07 ENCOUNTER — Ambulatory Visit
Admission: RE | Admit: 2021-11-07 | Discharge: 2021-11-07 | Disposition: A | Discharge status: Home / Self Care | Payer: Medicaid Other | Source: Ambulatory Visit | Attending: Radiation Oncology | Admitting: Radiation Oncology

## 2021-11-07 ENCOUNTER — Ambulatory Visit: Payer: Medicaid Other

## 2021-11-07 DIAGNOSIS — Z5111 Encounter for antineoplastic chemotherapy: Secondary | ICD-10-CM | POA: Diagnosis not present

## 2021-11-08 ENCOUNTER — Ambulatory Visit
Admission: RE | Admit: 2021-11-08 | Discharge: 2021-11-08 | Disposition: A | Discharge status: Home / Self Care | Payer: Medicaid Other | Source: Ambulatory Visit | Attending: Radiation Oncology | Admitting: Radiation Oncology

## 2021-11-08 ENCOUNTER — Telehealth: Payer: Self-pay | Admitting: Medical Oncology

## 2021-11-08 ENCOUNTER — Other Ambulatory Visit: Payer: Self-pay | Admitting: Radiation Oncology

## 2021-11-08 ENCOUNTER — Other Ambulatory Visit (HOSPITAL_COMMUNITY): Payer: Self-pay

## 2021-11-08 ENCOUNTER — Ambulatory Visit: Payer: Medicaid Other

## 2021-11-08 DIAGNOSIS — Z5111 Encounter for antineoplastic chemotherapy: Secondary | ICD-10-CM | POA: Diagnosis not present

## 2021-11-08 MED ORDER — ALPRAZOLAM 0.25 MG PO TABS
0.2500 mg | ORAL_TABLET | Freq: Three times a day (TID) | ORAL | 0 refills | Status: DC
  Filled 2021-11-08: qty 30, 10d supply, fill #0

## 2021-11-08 NOTE — Telephone Encounter (Signed)
Pt.notified

## 2021-11-08 NOTE — Telephone Encounter (Signed)
-----   Message from Curt Bears, MD sent at 10/30/2021  9:28 PM EST ----- Please let the patient know that she will need to take her magnesium oxide 3 times a day.  I sent a new prescription.  Thank you. ----- Message ----- From: Buel Ream, Lab In [facility] [place] Sent: 10/30/2021   9:32 AM EST To: Curt Bears, MD

## 2021-11-09 ENCOUNTER — Other Ambulatory Visit (HOSPITAL_COMMUNITY): Payer: Self-pay

## 2021-11-09 ENCOUNTER — Ambulatory Visit
Admission: RE | Admit: 2021-11-09 | Discharge: 2021-11-09 | Disposition: A | Discharge status: Home / Self Care | Payer: Medicaid Other | Source: Ambulatory Visit | Attending: Radiation Oncology | Admitting: Radiation Oncology

## 2021-11-09 ENCOUNTER — Other Ambulatory Visit: Payer: Self-pay

## 2021-11-09 DIAGNOSIS — Z5111 Encounter for antineoplastic chemotherapy: Secondary | ICD-10-CM | POA: Diagnosis not present

## 2021-11-11 ENCOUNTER — Emergency Department (HOSPITAL_COMMUNITY)
Admission: EM | Admit: 2021-11-11 | Discharge: 2021-11-11 | Disposition: A | Discharge status: Home / Self Care | Payer: Medicaid Other | Attending: Emergency Medicine | Admitting: Emergency Medicine

## 2021-11-11 ENCOUNTER — Other Ambulatory Visit: Payer: Self-pay

## 2021-11-11 ENCOUNTER — Encounter (HOSPITAL_COMMUNITY): Payer: Self-pay | Admitting: Emergency Medicine

## 2021-11-11 DIAGNOSIS — C3490 Malignant neoplasm of unspecified part of unspecified bronchus or lung: Secondary | ICD-10-CM | POA: Diagnosis not present

## 2021-11-11 DIAGNOSIS — B370 Candidal stomatitis: Secondary | ICD-10-CM | POA: Diagnosis not present

## 2021-11-11 DIAGNOSIS — F17210 Nicotine dependence, cigarettes, uncomplicated: Secondary | ICD-10-CM | POA: Diagnosis not present

## 2021-11-11 DIAGNOSIS — J449 Chronic obstructive pulmonary disease, unspecified: Secondary | ICD-10-CM | POA: Diagnosis not present

## 2021-11-11 DIAGNOSIS — Z79899 Other long term (current) drug therapy: Secondary | ICD-10-CM | POA: Diagnosis not present

## 2021-11-11 DIAGNOSIS — R112 Nausea with vomiting, unspecified: Secondary | ICD-10-CM

## 2021-11-11 DIAGNOSIS — J45909 Unspecified asthma, uncomplicated: Secondary | ICD-10-CM | POA: Diagnosis not present

## 2021-11-11 LAB — CBC WITH DIFFERENTIAL/PLATELET
Abs Immature Granulocytes: 0.23 10*3/uL — ABNORMAL HIGH (ref 0.00–0.07)
Basophils Absolute: 0.1 10*3/uL (ref 0.0–0.1)
Basophils Relative: 2 %
Eosinophils Absolute: 0.1 10*3/uL (ref 0.0–0.5)
Eosinophils Relative: 3 %
HCT: 33.2 % — ABNORMAL LOW (ref 36.0–46.0)
Hemoglobin: 11.2 g/dL — ABNORMAL LOW (ref 12.0–15.0)
Immature Granulocytes: 8 %
Lymphocytes Relative: 23 %
Lymphs Abs: 0.7 10*3/uL (ref 0.7–4.0)
MCH: 33.6 pg (ref 26.0–34.0)
MCHC: 33.7 g/dL (ref 30.0–36.0)
MCV: 99.7 fL (ref 80.0–100.0)
Monocytes Absolute: 0.8 10*3/uL (ref 0.1–1.0)
Monocytes Relative: 27 %
Neutro Abs: 1.2 10*3/uL — ABNORMAL LOW (ref 1.7–7.7)
Neutrophils Relative %: 37 %
Platelets: 348 10*3/uL (ref 150–400)
RBC: 3.33 MIL/uL — ABNORMAL LOW (ref 3.87–5.11)
RDW: 15.4 % (ref 11.5–15.5)
WBC: 3 10*3/uL — ABNORMAL LOW (ref 4.0–10.5)
nRBC: 0 % (ref 0.0–0.2)

## 2021-11-11 LAB — COMPREHENSIVE METABOLIC PANEL
ALT: 10 U/L (ref 0–44)
AST: 11 U/L — ABNORMAL LOW (ref 15–41)
Albumin: 3.8 g/dL (ref 3.5–5.0)
Alkaline Phosphatase: 77 U/L (ref 38–126)
Anion gap: 7 (ref 5–15)
BUN: 12 mg/dL (ref 6–20)
CO2: 18 mmol/L — ABNORMAL LOW (ref 22–32)
Calcium: 9.4 mg/dL (ref 8.9–10.3)
Chloride: 110 mmol/L (ref 98–111)
Creatinine, Ser: 0.9 mg/dL (ref 0.44–1.00)
GFR, Estimated: >60 mL/min (ref >60)
Glucose, Bld: 96 mg/dL (ref 70–99)
Potassium: 3.4 mmol/L — ABNORMAL LOW (ref 3.5–5.1)
Sodium: 135 mmol/L (ref 135–145)
Total Bilirubin: 0.5 mg/dL (ref 0.3–1.2)
Total Protein: 7.7 g/dL (ref 6.5–8.1)

## 2021-11-11 LAB — MAGNESIUM: Magnesium: 1.7 mg/dL (ref 1.7–2.4)

## 2021-11-11 LAB — LIPASE, BLOOD: Lipase: 31 U/L (ref 11–51)

## 2021-11-11 MED ORDER — PROMETHAZINE HCL 25 MG PO TABS: 25.0000 mg | ORAL_TABLET | Freq: Four times a day (QID) | ORAL | 0 refills | Status: DC | PRN

## 2021-11-11 MED ORDER — ONDANSETRON HCL 4 MG/2ML IJ SOLN: 4.0000 mg | Freq: Once | INTRAMUSCULAR | Status: DC

## 2021-11-11 MED ORDER — LORAZEPAM 2 MG/ML IJ SOLN
1.0000 mg | Freq: Once | INTRAMUSCULAR | Status: AC
  Administered 2021-11-11: 12:00:00 1 mg via INTRAVENOUS
  Filled 2021-11-11: qty 1

## 2021-11-11 MED ORDER — NYSTATIN 100000 UNIT/ML MT SUSP: 500000.0000 [IU] | Freq: Four times a day (QID) | OROMUCOSAL | 0 refills | Status: AC

## 2021-11-11 MED ORDER — LACTATED RINGERS IV BOLUS
1000.0000 mL | Freq: Once | INTRAVENOUS | Status: AC
  Administered 2021-11-11: 13:00:00 1000 mL via INTRAVENOUS

## 2021-11-11 MED ORDER — NYSTATIN 100000 UNIT/ML MT SUSP
5.0000 mL | Freq: Once | OROMUCOSAL | Status: DC
  Filled 2021-11-11: qty 5

## 2021-11-11 NOTE — ED Provider Notes (Signed)
4:47 PM Care of the patient assumed at signout.  Now, on exam, she is awake, alert, has tolerated oral intake.  I inquired about her degree of nausea, and offered admission for monitoring, versus discharge with outpatient follow-up.  Patient requests this latter option, states that she is comfortable with it.  She will receive nystatin here for thrush, be discharged with new antiemetic, follow-up with primary care.   Carmin Muskrat, MD 11/11/21 [phone]

## 2021-11-11 NOTE — ED Provider Notes (Signed)
[facility] DEPT Provider Note   CSN: [id number] Arrival date & time: 11/11/21  1012     History Chief Complaint  Patient presents with   Emesis   Nausea    [name] is a 50 y.o. female.  Patient is a 50 year old female who is currently being treated for lung cancer with chemotherapy and radiation who presents with nausea and vomiting started yesterday.  [name] has we will keep anything down.  [name] has a burning in her throat and upper chest.  No abdominal pain.  No diarrhea.  No fevers.  No other chest pain or shortness of breath.      Past Medical History:  Diagnosis Date   Ankle sprain    left   Anxiety    treated for panic attacks in the past.   Asthma    COPD (chronic obstructive pulmonary disease) (HCC)    Depression    Irregular heart rate    Lung cancer (HCC)    Renal disorder    kidney infections    Patient Active Problem List   Diagnosis Date Noted   Encounter for antineoplastic chemotherapy 09/28/2021   Malignant neoplasm of right upper lobe of lung ([facility]) 09/05/2021   Status post lumbar microdiscectomy 06/24/2018   Left lumbar radiculitis 04/25/2017   Lumbar degenerative disc disease 04/25/2017    Past Surgical History:  Procedure Laterality Date   APPENDECTOMY     BRONCHIAL BIOPSY  09/21/2021   Procedure: BRONCHIAL BIOPSIES;  Surgeon: Collene Gobble, MD;  Location: [facility];  Service: Pulmonary;;   BRONCHIAL BRUSHINGS  09/21/2021   Procedure: BRONCHIAL BRUSHINGS;  Surgeon: Collene Gobble, MD;  Location: [facility];  Service: Pulmonary;;   BRONCHIAL NEEDLE ASPIRATION BIOPSY  09/21/2021   Procedure: BRONCHIAL NEEDLE ASPIRATION BIOPSIES;  Surgeon: Collene Gobble, MD;  Location: [facility];  Service: Pulmonary;;   LUMBAR LAMINECTOMY N/A 05/13/2018   Procedure: LEFT LUMBAR FOUR-FIVE MICRODISCECTOMY;  Surgeon: Marybelle Killings, MD;  Location: [facility];  Service: Orthopedics;  Laterality: N/A;   TUBAL LIGATION      TUBAL LIGATION  1998   VIDEO BRONCHOSCOPY WITH ENDOBRONCHIAL ULTRASOUND N/A 09/21/2021   Procedure: VIDEO BRONCHOSCOPY WITH ENDOBRONCHIAL ULTRASOUND;  Surgeon: Collene Gobble, MD;  Location: [facility];  Service: Pulmonary;  Laterality: N/A;     OB History   No obstetric history on file.     Family History  Problem Relation Age of Onset   Hypertension Mother    Hypertension Father    Heart disease Father     Social History   Tobacco Use   Smoking status: Every Day    Packs/day: 0.50    Years: 30.00    Pack years: 15.00    Types: Cigarettes   Smokeless tobacco: Never  Vaping Use   Vaping Use: Never used  Substance Use Topics   Alcohol use: Yes    Comment: rare   Drug use: Not Currently    Types: Marijuana    Comment: occ-    Home Medications Prior to Admission medications   Medication Sig Start Date End Date Taking? Authorizing Provider  albuterol (PROVENTIL HFA;VENTOLIN HFA) 108 (90 Base) MCG/ACT inhaler Inhale 2 puffs into the lungs every 6 (six) hours as needed for wheezing or shortness of breath. Patient not taking: Reported on 09/15/2021 03/13/17   Alfonse Spruce, FNP  ALPRAZolam Duanne Moron) 0.25 MG tablet Take 1 tablet (0.25 mg total) by mouth 3 (three) times daily as needed for  anxiety 11/08/21   Gery Pray, MD  lidocaine (XYLOCAINE) 2 % solution Use as directed 15 mLs in the mouth or throat as needed for mouth pain. Swallow ~ 20 minutes prior to meals, dilute as per writtten instructions 10/09/21   Gery Pray, MD  magnesium oxide (MAG-OX) 400 (240 Mg) MG tablet Take 1 tablet (400 mg total) by mouth 3 (three) times daily. 10/30/21   Curt Bears, MD  ondansetron (ZOFRAN ODT) 8 MG disintegrating tablet Dissolve 1 tablet (8 mg total) by mouth every 8 (eight) hours as needed for nausea or vomiting. 10/11/21   Heilingoetter, Cassandra L, PA-C  oxyCODONE (OXY IR/ROXICODONE) 5 MG immediate release tablet Take 1 tablet by mouth every 4 hours as needed for  severe pain. 11/06/21   Gery Pray, MD  oxyCODONE ER [facility]) 18 MG C12A Take 1 capsule by mouth 2 times daily. 11/06/21   Gery Pray, MD  oxyCODONE-acetaminophen (PERCOCET/ROXICET) 5-325 MG tablet Take 1 tablet by mouth every 8 (eight) hours as needed for severe pain. 10/15/21   Curt Bears, MD  prochlorperazine (COMPAZINE) 10 MG tablet Take 1 tablet (10 mg total) by mouth every 6 (six) hours as needed for nausea or vomiting. 10/02/21   Curt Bears, MD  sucralfate (CARAFATE) 1 g tablet Take 1 tablet (1 g total) by mouth 4 (four) times daily -  with meals and at bedtime. Crush and dissolve in 10 mL of warm water prior to swallowing 11/06/21   Gery Pray, MD    Allergies    Naproxen and Bactrim [sulfamethoxazole-trimethoprim]  Review of Systems   Review of Systems  Constitutional:  Negative for chills, diaphoresis, fatigue and fever.  HENT:  Positive for sore throat. Negative for congestion, rhinorrhea and sneezing.   Eyes: Negative.   Respiratory:  Negative for cough, chest tightness and shortness of breath.   Cardiovascular:  Negative for chest pain and leg swelling.  Gastrointestinal:  Positive for nausea and vomiting. Negative for abdominal pain, blood in stool and diarrhea.  Genitourinary:  Negative for difficulty urinating, flank pain, frequency and hematuria.  Musculoskeletal:  Negative for arthralgias and back pain.  Skin:  Negative for rash.  Neurological:  Negative for dizziness, speech difficulty, weakness, numbness and headaches.   Physical Exam Updated Vital Signs BP 101/72 (BP Location: Right Arm)    Pulse (!) 112    Temp 98.7 F (37.1 C) (Oral)    Resp 20    Ht 5\' 8"  (1.727 m)    Wt 77.6 kg    LMP 04/24/2018    SpO2 98%    BMI 26.00 kg/m   Physical Exam Constitutional:      Appearance: [name] is well-developed.  HENT:     Head: Normocephalic and atraumatic.     Mouth/Throat:     Comments: White coating on the tongue consistent with oral  thrush Eyes:     Pupils: Pupils are equal, round, and reactive to light.  Cardiovascular:     Rate and Rhythm: Regular rhythm. Tachycardia present.     Heart sounds: Normal heart sounds.  Pulmonary:     Effort: Pulmonary effort is normal. No respiratory distress.     Breath sounds: Normal breath sounds. No wheezing or rales.  Chest:     Chest wall: No tenderness.  Abdominal:     General: Bowel sounds are normal.     Palpations: Abdomen is soft.     Tenderness: There is no abdominal tenderness. There is no guarding or rebound.  Musculoskeletal:  General: Normal range of motion.     Cervical back: Normal range of motion and neck supple.  Lymphadenopathy:     Cervical: No cervical adenopathy.  Skin:    General: Skin is warm and dry.     Findings: No rash.  Neurological:     Mental Status: [name] is alert and oriented to person, place, and time.    ED Results / Procedures / Treatments   Labs (all labs ordered are listed, but only abnormal results are displayed) Labs Reviewed  COMPREHENSIVE METABOLIC PANEL - Abnormal; Notable for the following components:      Result Value   Potassium 3.4 (*)    CO2 18 (*)    AST 11 (*)    All other components within normal limits  CBC WITH DIFFERENTIAL/PLATELET - Abnormal; Notable for the following components:   WBC 3.0 (*)    RBC 3.33 (*)    Hemoglobin 11.2 (*)    HCT 33.2 (*)    Neutro Abs 1.2 (*)    Abs Immature Granulocytes 0.23 (*)    All other components within normal limits  LIPASE, BLOOD  MAGNESIUM  URINALYSIS, ROUTINE W REFLEX MICROSCOPIC    EKG EKG Interpretation  Date/Time:  Sunday November 11 2021 11:10:10 EST Ventricular Rate:  113 PR Interval:  120 QRS Duration: 106 QT Interval:  484 QTC Calculation: 664 R Axis:   78 Text Interpretation: Ectopic atrial tachycardia, unifocal Borderline repolarization abnormality Prolonged QT interval Confirmed by Malvin Johns [phone]) on 11/11/2021 11:22:33 AM  Radiology No  results found.  Procedures Procedures   Medications Ordered in ED Medications  LORazepam (ATIVAN) injection 1 mg (1 mg Intravenous Given 11/11/21 1209)  lactated ringers bolus 1,000 mL (1,000 mLs Intravenous New Bag/Given 11/11/21 1245)    ED Course  I have reviewed the triage vital signs and the nursing notes.  Pertinent labs & imaging results that were available during my care of the patient were reviewed by me and considered in my medical decision making (see chart for details).    MDM Rules/Calculators/A&P                         Patient is a 50 year old female undergoing chemotherapy and radiation therapy for non-small carcinoma of the lung presenting with nausea and vomiting with burning in her throat.  [name] does have evidence of oral thrush on my exam.  Her labs are nonconcerning.  [name] is mildly tachycardic.  Was given IV fluids.  [name] does have a bit of a prolonged QT interval.  [name] was given Ativan for antiemetic effects.  [name] is feeling better after this.  [name] is awaiting fluid bolus and p.o. trial.  Dr. Vanita Panda to take over this pending reevaluation.     Final Clinical Impression(s) / ED Diagnoses Final diagnoses:  Nausea and vomiting, unspecified vomiting type  Oral thrush    Rx / DC Orders ED Discharge Orders     None        Malvin Johns, MD 11/11/21 1339

## 2021-11-11 NOTE — Discharge Instructions (Signed)
Please be sure to follow-up with your physician for appropriate ongoing care.  Return here for concerning changes in your condition

## 2021-11-11 NOTE — ED Triage Notes (Signed)
Complains of N/V for the past few days, unable to keep anything down. Denies diarrhea or fevers. Pt reports burning in her throat and chest, thinks it is from the radiation. Undergoing chemo and radiation.

## 2021-11-11 NOTE — ED Provider Notes (Signed)
Emergency Medicine Provider Triage Evaluation Note  [name] , a 50 y.o. female  was evaluated in triage.  Pt complains of n/v.  Is undergoing chemo/radiation for lung cancer.  No abd pain.  Has burning to throat/chest.  No fever.  Review of Systems  Positive: Vomiting/burning to throat Negative: Abd pain, fever  Physical Exam  BP 104/84 (BP Location: Left Arm)    Pulse (!) 122    Temp 97.9 F (36.6 C) (Oral)    Resp 18    Ht 5\' 8"  (1.727 m)    Wt 77.6 kg    LMP 04/24/2018    SpO2 98%    BMI 26.00 kg/m  Gen:   Awake, no distress , tachycardic   Resp:  Normal effort   MSK:   Moves extremities without difficulty   Other:     Medical Decision Making  Medically screening exam initiated at 10:51 AM.  Appropriate orders placed.  [name] was informed that the remainder of the evaluation will be completed by another provider, this initial triage assessment does not replace that evaluation, and the importance of remaining in the ED until their evaluation is complete.      Malvin Johns, MD 11/11/21 1052

## 2021-11-12 ENCOUNTER — Ambulatory Visit: Payer: Medicaid Other

## 2021-11-12 ENCOUNTER — Ambulatory Visit
Admission: RE | Admit: 2021-11-12 | Discharge: 2021-11-12 | Disposition: A | Discharge status: Home / Self Care | Payer: Medicaid Other | Source: Ambulatory Visit | Attending: Radiation Oncology | Admitting: Radiation Oncology

## 2021-11-12 DIAGNOSIS — Z5111 Encounter for antineoplastic chemotherapy: Secondary | ICD-10-CM | POA: Diagnosis not present

## 2021-11-13 ENCOUNTER — Ambulatory Visit
Admission: RE | Admit: 2021-11-13 | Discharge: 2021-11-13 | Disposition: A | Discharge status: Home / Self Care | Payer: Medicaid Other | Source: Ambulatory Visit | Attending: Radiation Oncology | Admitting: Radiation Oncology

## 2021-11-13 ENCOUNTER — Inpatient Hospital Stay: Payer: Medicaid Other

## 2021-11-13 ENCOUNTER — Inpatient Hospital Stay (HOSPITAL_BASED_OUTPATIENT_CLINIC_OR_DEPARTMENT_OTHER): Payer: Medicaid Other | Admitting: Internal Medicine

## 2021-11-13 ENCOUNTER — Other Ambulatory Visit: Payer: Self-pay

## 2021-11-13 ENCOUNTER — Encounter: Payer: Self-pay | Admitting: Radiation Oncology

## 2021-11-13 VITALS — HR 113

## 2021-11-13 VITALS — BP 105/74 | HR 114 | Temp 96.4°F | Resp 19 | Ht 68.0 in | Wt 166.0 lb

## 2021-11-13 DIAGNOSIS — E860 Dehydration: Secondary | ICD-10-CM | POA: Diagnosis not present

## 2021-11-13 DIAGNOSIS — Z5111 Encounter for antineoplastic chemotherapy: Secondary | ICD-10-CM

## 2021-11-13 DIAGNOSIS — C3411 Malignant neoplasm of upper lobe, right bronchus or lung: Secondary | ICD-10-CM

## 2021-11-13 LAB — CBC WITH DIFFERENTIAL (CANCER CENTER ONLY)
Abs Immature Granulocytes: 0.24 10*3/uL — ABNORMAL HIGH (ref 0.00–0.07)
Basophils Absolute: 0.1 10*3/uL (ref 0.0–0.1)
Basophils Relative: 2 %
Eosinophils Absolute: 0.1 10*3/uL (ref 0.0–0.5)
Eosinophils Relative: 2 %
HCT: 31.4 % — ABNORMAL LOW (ref 36.0–46.0)
Hemoglobin: 10.9 g/dL — ABNORMAL LOW (ref 12.0–15.0)
Immature Granulocytes: 6 %
Lymphocytes Relative: 18 %
Lymphs Abs: 0.7 10*3/uL (ref 0.7–4.0)
MCH: 34 pg (ref 26.0–34.0)
MCHC: 34.7 g/dL (ref 30.0–36.0)
MCV: 97.8 fL (ref 80.0–100.0)
Monocytes Absolute: 0.9 10*3/uL (ref 0.1–1.0)
Monocytes Relative: 24 %
Neutro Abs: 1.8 10*3/uL (ref 1.7–7.7)
Neutrophils Relative %: 48 %
Platelet Count: 392 10*3/uL (ref 150–400)
RBC: 3.21 MIL/uL — ABNORMAL LOW (ref 3.87–5.11)
RDW: 15.5 % (ref 11.5–15.5)
Smear Review: NORMAL
WBC Count: 3.8 10*3/uL — ABNORMAL LOW (ref 4.0–10.5)
nRBC: 0 % (ref 0.0–0.2)

## 2021-11-13 LAB — CMP (CANCER CENTER ONLY)
ALT: 6 U/L (ref 0–44)
AST: 10 U/L — ABNORMAL LOW (ref 15–41)
Albumin: 3.9 g/dL (ref 3.5–5.0)
Alkaline Phosphatase: 84 U/L (ref 38–126)
Anion gap: 11 (ref 5–15)
BUN: 9 mg/dL (ref 6–20)
CO2: 20 mmol/L — ABNORMAL LOW (ref 22–32)
Calcium: 9.4 mg/dL (ref 8.9–10.3)
Chloride: 107 mmol/L (ref 98–111)
Creatinine: 0.83 mg/dL (ref 0.44–1.00)
GFR, Estimated: >60 mL/min (ref >60)
Glucose, Bld: 99 mg/dL (ref 70–99)
Potassium: 3.1 mmol/L — ABNORMAL LOW (ref 3.5–5.1)
Sodium: 138 mmol/L (ref 135–145)
Total Bilirubin: 0.3 mg/dL (ref 0.3–1.2)
Total Protein: 7.4 g/dL (ref 6.5–8.1)

## 2021-11-13 LAB — MAGNESIUM: Magnesium: 1.5 mg/dL — ABNORMAL LOW (ref 1.7–2.4)

## 2021-11-13 MED ORDER — SODIUM CHLORIDE 0.9 % IV SOLN
150.0000 mg | Freq: Once | INTRAVENOUS | Status: AC
  Administered 2021-11-13: 12:00:00 150 mg via INTRAVENOUS
  Filled 2021-11-13: qty 150

## 2021-11-13 MED ORDER — PALONOSETRON HCL INJECTION 0.25 MG/5ML
0.2500 mg | Freq: Once | INTRAVENOUS | Status: AC
  Administered 2021-11-13: 12:00:00 0.25 mg via INTRAVENOUS
  Filled 2021-11-13: qty 5

## 2021-11-13 MED ORDER — POTASSIUM CHLORIDE IN NACL 20-0.9 MEQ/L-% IV SOLN
Freq: Once | INTRAVENOUS | Status: AC
  Filled 2021-11-13: qty 1000

## 2021-11-13 MED ORDER — OXYCODONE HCL 5 MG PO TABS
5.0000 mg | ORAL_TABLET | Freq: Once | ORAL | Status: AC
  Administered 2021-11-13: 10:00:00 5 mg via ORAL
  Filled 2021-11-13: qty 1

## 2021-11-13 MED ORDER — SODIUM CHLORIDE 0.9 % IV SOLN
10.0000 mg | Freq: Once | INTRAVENOUS | Status: AC
  Administered 2021-11-13: 12:00:00 10 mg via INTRAVENOUS
  Filled 2021-11-13: qty 10

## 2021-11-13 MED ORDER — ACETAMINOPHEN 325 MG PO TABS
325.0000 mg | ORAL_TABLET | Freq: Once | ORAL | Status: AC
  Administered 2021-11-13: 10:00:00 325 mg via ORAL
  Filled 2021-11-13: qty 1

## 2021-11-13 MED ORDER — SODIUM CHLORIDE 0.9 % IV SOLN: Freq: Once | INTRAVENOUS | Status: AC

## 2021-11-13 MED ORDER — ACETAMINOPHEN 325 MG PO TABS
650.0000 mg | ORAL_TABLET | Freq: Once | ORAL | Status: AC
  Administered 2021-11-13: 15:00:00 650 mg via ORAL
  Filled 2021-11-13: qty 2

## 2021-11-13 MED ORDER — SODIUM CHLORIDE 0.9 % IV SOLN
100.0000 mg/m2 | Freq: Once | INTRAVENOUS | Status: AC
  Administered 2021-11-13: 15:00:00 190 mg via INTRAVENOUS
  Filled 2021-11-13: qty 9.5

## 2021-11-13 MED ORDER — MAGNESIUM SULFATE 2 GM/50ML IV SOLN
2.0000 g | Freq: Once | INTRAVENOUS | Status: AC
  Administered 2021-11-13: 10:00:00 2 g via INTRAVENOUS
  Filled 2021-11-13: qty 50

## 2021-11-13 MED ORDER — SODIUM CHLORIDE 0.9 % IV SOLN
80.0000 mg/m2 | Freq: Once | INTRAVENOUS | Status: AC
  Administered 2021-11-13: 14:00:00 155 mg via INTRAVENOUS
  Filled 2021-11-13: qty 155

## 2021-11-13 NOTE — Patient Instructions (Addendum)
[facility]  Discharge Instructions: Thank you for choosing [facility] to provide your oncology and hematology care.   If you have a lab appointment with the [facility], please go directly to the [facility] and check in at the registration area.   Wear comfortable clothing and clothing appropriate for easy access to any Portacath or PICC line.   We strive to give you quality time with your provider. You may need to reschedule your appointment if you arrive late (15 or more minutes).  Arriving late affects you and other patients whose appointments are after yours.  Also, if you miss three or more appointments without notifying the office, you may be dismissed from the clinic at the providers discretion.      For prescription refill requests, have your pharmacy contact our office and allow 72 hours for refills to be completed.    Today you received the following chemotherapy and/or immunotherapy agents: Cisplatin and Etoposide.   To help prevent nausea and vomiting after your treatment, we encourage you to take your nausea medication as directed.  BELOW ARE SYMPTOMS THAT SHOULD BE REPORTED IMMEDIATELY: *FEVER GREATER THAN 100.4 F (38 C) OR HIGHER *CHILLS OR SWEATING *NAUSEA AND VOMITING THAT IS NOT CONTROLLED WITH YOUR NAUSEA MEDICATION *UNUSUAL SHORTNESS OF BREATH *UNUSUAL BRUISING OR BLEEDING *URINARY PROBLEMS (pain or burning when urinating, or frequent urination) *BOWEL PROBLEMS (unusual diarrhea, constipation, pain near the anus) TENDERNESS IN MOUTH AND THROAT WITH OR WITHOUT PRESENCE OF ULCERS (sore throat, sores in mouth, or a toothache) UNUSUAL RASH, SWELLING OR PAIN  UNUSUAL VAGINAL DISCHARGE OR ITCHING   Items with * indicate a potential emergency and should be followed up as soon as possible or go to the Emergency Department if any problems should occur.  Please show the CHEMOTHERAPY ALERT CARD or IMMUNOTHERAPY ALERT CARD at  check-in to the Emergency Department and triage nurse.  Should you have questions after your visit or need to cancel or reschedule your appointment, please contact [facility]  Dept: [phone]  and follow the prompts.  Office hours are 8:00 a.m. to 4:30 p.m. Monday - Friday. Please note that voicemails left after 4:00 p.m. may not be returned until the following business day.  We are closed weekends and major holidays. You have access to a nurse at all times for urgent questions. Please call the main number to the clinic Dept: [phone] and follow the prompts.   For any non-urgent questions, you may also contact your provider using MyChart. We now offer e-Visits for anyone 41 and older to request care online for non-urgent symptoms. For details visit [email or web address].   Also download the MyChart app! Go to the app store, search "MyChart", open the app, select [facility], and log in with your MyChart username and password.  Due to Covid, a mask is required upon entering the hospital/clinic. If you do not have a mask, one will be given to you upon arrival. For doctor visits, patients may have 1 support person aged 60 or older with them. For treatment visits, patients cannot have anyone with them due to current Covid guidelines and our immunocompromised population.   Acetaminophen; Oxycodone Tablets What is this medication? ACETAMINOPHEN; OXYCODONE (a set a MEE noe fen; ox i KOE done) treats moderate pain. It is prescribed when other pain medications have not worked or cannot be tolerated. It works by blocking pain signals in the brain. This medication is a combination  of acetaminophen and an opioid. This medicine may be used for other purposes; ask your health care provider or pharmacist if you have questions. COMMON BRAND NAME(S): Endocet, Magnacet, Nalocet, Narvox, Percocet, Perloxx, Primalev, Primlev, Prolate, Roxicet, Tylox, Xolox What should I tell my  care team before I take this medication? They need to know if you have any of these conditions: Brain tumor Drug abuse or addiction Head injury Heart disease If you often drink alcohol Kidney disease Liver disease Low adrenal gland function Lung disease, asthma, or breathing problem Seizures Stomach or intestine problems Taken an MAOI like Marplan, Nardil, or Parnate in the last 14 days An unusual or allergic reaction to acetaminophen, oxycodone, other medications, foods, dyes or preservatives Pregnant or trying to get pregnant Breast-feeding How should I use this medication? Take this medicine by mouth with a full glass of water. Take it as directed on the label. You can take it with or without food. If it upsets your stomach, take it with food. Do not use it more often than directed. There may be unused or extra doses in the bottle after you finish your treatment. Talk to your health care provider if you have questions about your dose. A special MedGuide will be given to you by the pharmacist with each prescription and refill. Be sure to read this information carefully each time. Talk to your care team about the use of this medication in children. Special care may be needed. Patients over 10 years of age may have a stronger reaction and need a smaller dose. Overdosage: If you think you have taken too much of this medicine contact a poison control center or emergency room at once. NOTE: This medicine is only for you. Do not share this medicine with others. What if I miss a dose? If you miss a dose, take it as soon as you remember. Then, take your next dose 12 hours later. Do not take double or extra doses. What may interact with this medication? This medication may interact with the following: Alcohol Antihistamines for allergy, cough and cold Antiviral medications for HIV or AIDS Atropine Certain antibiotics like clarithromycin, erythromycin, linezolid, rifampin Certain medications  for anxiety or sleep Certain medications for bladder problems like oxybutynin, tolterodine Certain medications for depression like amitriptyline, fluoxetine, sertraline Certain medications for fungal infections like ketoconazole, itraconazole, voriconazole Certain medications for migraine headache like almotriptan, eletriptan, frovatriptan, naratriptan, rizatriptan, sumatriptan, zolmitriptan Certain medications for nausea or vomiting like dolasetron, ondansetron, palonosetron Certain medications for Parkinson's disease like benztropine, trihexyphenidyl Certain medications for seizures like phenobarbital, phenytoin, primidone Certain medications for stomach problems like dicyclomine, hyoscyamine Certain medications for travel sickness like scopolamine Diuretics General anesthetics like halothane, isoflurane, methoxyflurane, propofol Ipratropium Local anesthetics like lidocaine, pramoxine, tetracaine MAOIs like Carbex, Eldepryl, Marplan, Nardil, and Parnate Medications that relax muscles for surgery Methylene blue Nilotinib Other medications with acetaminophen Other narcotic medications for pain or cough Phenothiazines like chlorpromazine, mesoridazine, prochlorperazine, thioridazine This list may not describe all possible interactions. Give your health care provider a list of all the medicines, herbs, non-prescription drugs, or dietary supplements you use. Also tell them if you smoke, drink alcohol, or use illegal drugs. Some items may interact with your medicine. What should I watch for while using this medication? Tell your care team if your pain does not go away, if it gets worse, or if you have new or a different type of pain. You may develop tolerance to this medication. Tolerance means that you will need a  higher dose of the medication for pain relief. Tolerance is normal and is expected if you take this medication for a long time. There are different types of narcotic medications  (opioids) for pain. If you take more than one type at the same time, you may have more side effects. Give your care team a list of all medications you use. They will tell you how much medication to take. Do not take more medication than directed. Call emergency services if you have problems breathing. Do not suddenly stop taking your medication because you may develop a severe reaction. Your body becomes used to the medication. This does NOT mean you are addicted. Addiction is a behavior related to getting and using a medication for a nonmedical reason. If you have pain, you have a medical reason to take pain medication. Your care team will tell you how much medication to take. If your care team wants you to stop the medication, the dose will be slowly lowered over time to avoid any side effects. Talk to your care team about naloxone and how to get it. Naloxone is an emergency medication used for an opioid overdose. An overdose can happen if you take too much opioid. It can also happen if an opioid is taken with some other medications or substances, like alcohol. Know the symptoms of an overdose, like trouble breathing, unusually tired or sleepy, or not being able to respond or wake up. Make sure to tell caregivers and close contacts where it is stored. Make sure they know how to use it. After naloxone is given, you must call emergency services. Naloxone is a temporary treatment. Repeat doses may be needed. Do not take other medications that contain acetaminophen with this medication. Many non-prescription medications contain acetaminophen. Always read labels carefully. If you have questions, ask your care team. If you take too much acetaminophen, get medical help right away. Too much acetaminophen can be very dangerous and cause liver damage. Even if you do not have symptoms, it is important to get help right away. You may get drowsy or dizzy. Do not drive, use machinery, or do anything that needs mental  alertness until you know how this medication affects you. Do not stand up or sit up quickly, especially if you are an older patient. This reduces the risk of dizzy or fainting spells. Alcohol may interfere with the effect of this medication. Avoid alcoholic drinks. This medication will cause constipation. If you do not have a bowel movement for 3 days, call your care team. Your mouth may get dry. Chewing sugarless gum or sucking hard candy and drinking plenty of water may help. Contact your care team if the problem does not go away or is severe. What side effects may I notice from receiving this medication? Side effects that you should report to your care team as soon as possible: Allergic reactions--skin rash, itching, hives, swelling of the face, lips, tongue, or throat CNS depression--slow or shallow breathing, shortness of breath, feeling faint, dizziness, confusion, trouble staying awake Liver injury--right upper belly pain, loss of appetite, nausea, light-colored stool, dark yellow or brown urine, yellowing skin or eyes, unusual weakness or fatigue Low adrenal gland function--nausea, vomiting, loss of appetite, unusual weakness or fatigue, dizziness Low blood pressure--dizziness, feeling faint or lightheaded, blurry vision Redness, blistering, peeling, or loosening of the skin, including inside the mouth Side effects that usually do not require medical attention (report to your care team if they continue or are bothersome): Constipation Dizziness Drowsiness  Dry mouth Headache Nausea Trouble sleeping Upset stomach Vomiting This list may not describe all possible side effects. Call your doctor for medical advice about side effects. You may report side effects to FDA at 1-800-FDA-1088. Where should I keep my medication? Keep out of the reach of children and pets. This medication can be abused. Keep it in a safe place to protect it from theft. Do not share it with anyone. It is only for you.  Selling or giving away this medication is dangerous and against the law. Store at room temperature between 20 and 25 degrees C (68 and 77 degrees F). Protect from light. Get rid of any unused medicine after the expiration date. This medication may cause harm and death if it is taken by other adults, children, or pets. It is important to get rid of the medication as soon as you no longer need it, or it is expired. You can do this in two ways: Take the medication to a medication take-back program. Check with your pharmacy or law enforcement to find a location. If you cannot return the medication, flush it down the toilet. NOTE: This sheet is a summary. It may not cover all possible information. If you have questions about this medicine, talk to your doctor, pharmacist, or health care provider.  2022 Elsevier/Gold Standard (2020-11-23 00:00:00)

## 2021-11-13 NOTE — Progress Notes (Signed)
Per Dr. Julien Nordmann, ok for treatment today with elevated heart rate. Pt. denies chest pain, dizziness, and no shortness of breath.

## 2021-11-13 NOTE — Progress Notes (Signed)
[place] [place] [phone]   [phone]  OFFICE PROGRESS NOTE  [name], PA-C [place] [address]  DIAGNOSIS: Limited stage (T3, N2, M0) small cell lung cancer presented with right hilar/suprahilar mass with occlusion of the right upper lobe bronchus in addition to right paratracheal, right infra hilar and subcarinal lymphadenopathy as well as additional right upper lobe and right apical pulmonary nodules.  This was diagnosed in October 2022.  PRIOR THERAPY: None  CURRENT THERAPY: Systemic chemotherapy with cisplatin 80 Mg/M2 on day 1 and etoposide 100 Mg/M2 on days 1, 2 and 3 every 3 weeks.  First dose October 02, 2021.  This will be concurrent with radiotherapy under the care of Dr. Sondra Come.  Status post 2 cycles.  INTERVAL HISTORY: [name] 50 y.o. female returns to the clinic today for follow-up visit accompanied by her mother.  The patient is feeling fine today with no concerning complaints except for the swallowing difficulty from the radiation with lack of appetite and dehydration.  She went to the emergency room few days ago complaining of the same problems.  She denied having any current chest pain, shortness of breath, cough or hemoptysis.  She denied having any fever or chills.  She has no nausea, vomiting, diarrhea or constipation.  She is expected to complete the last fraction of radiotherapy today.  The patient is here today for evaluation before starting cycle #3.  MEDICAL HISTORY: Past Medical History:  Diagnosis Date   Ankle sprain    left   Anxiety    treated for panic attacks in the past.   Asthma    COPD (chronic obstructive pulmonary disease) (HCC)    Depression    Irregular heart rate    Lung cancer (HCC)    Renal disorder    kidney infections    ALLERGIES:  is allergic to naproxen and bactrim [sulfamethoxazole-trimethoprim].  MEDICATIONS:  Current Outpatient Medications  Medication  Sig Dispense Refill   albuterol (PROVENTIL HFA;VENTOLIN HFA) 108 (90 Base) MCG/ACT inhaler Inhale 2 puffs into the lungs every 6 (six) hours as needed for wheezing or shortness of breath. 1 Inhaler 2   ALPRAZolam (XANAX) 0.25 MG tablet Take 1 tablet (0.25 mg total) by mouth 3 (three) times daily as needed for anxiety 30 tablet 0   diphenhydramine-acetaminophen (TYLENOL PM) 25-500 MG TABS tablet Take 3 tablets by mouth at bedtime as needed (sleep).     lidocaine (XYLOCAINE) 2 % solution Use as directed 15 mLs in the mouth or throat as needed for mouth pain. Swallow ~ 20 minutes prior to meals, dilute as per writtten instructions (Patient not taking: Reported on 11/11/2021) 100 mL 2   magnesium oxide (MAG-OX) 400 (240 Mg) MG tablet Take 1 tablet (400 mg total) by mouth 3 (three) times daily. 90 tablet 2   nystatin (MYCOSTATIN) 100000 UNIT/ML suspension Take 5 mLs (500,000 Units total) by mouth 4 (four) times daily for 7 days. 60 mL 0   ondansetron (ZOFRAN ODT) 8 MG disintegrating tablet Dissolve 1 tablet (8 mg total) by mouth every 8 (eight) hours as needed for nausea or vomiting. 30 tablet 2   oxyCODONE (OXY IR/ROXICODONE) 5 MG immediate release tablet Take 1 tablet by mouth every 4 hours as needed for severe pain. 30 tablet 0   oxyCODONE ER (XTAMPZA ER) 18 MG C12A Take 1 capsule by mouth 2 times daily. (Patient not taking: Reported on 11/11/2021) 28 capsule  0   oxyCODONE-acetaminophen (PERCOCET/ROXICET) 5-325 MG tablet Take 1 tablet by mouth every 8 (eight) hours as needed for severe pain. (Patient not taking: Reported on 11/11/2021) 30 tablet 0   prochlorperazine (COMPAZINE) 10 MG tablet Take 1 tablet (10 mg total) by mouth every 6 (six) hours as needed for nausea or vomiting. 30 tablet 1   promethazine (PHENERGAN) 25 MG tablet Take 1 tablet (25 mg total) by mouth every 6 (six) hours as needed for nausea or vomiting. 30 tablet 0   sucralfate (CARAFATE) 1 g tablet Take 1 tablet (1 g total) by mouth 4  (four) times daily -  with meals and at bedtime. Crush and dissolve in 10 mL of warm water prior to swallowing 120 tablet 1   No current facility-administered medications for this visit.    SURGICAL HISTORY:  Past Surgical History:  Procedure Laterality Date   APPENDECTOMY     BRONCHIAL BIOPSY  09/21/2021   Procedure: BRONCHIAL BIOPSIES;  Surgeon: Collene Gobble, MD;  Location[facility] ENDOSCOPY;  Service: Pulmonary;;   BRONCHIAL BRUSHINGS  09/21/2021   Procedure: BRONCHIAL BRUSHINGS;  Surgeon: Collene Gobble, MD;  Location: [facility] ENDOSCOPY;  Service: Pulmonary;;   BRONCHIAL NEEDLE ASPIRATION BIOPSY  09/21/2021   Procedure: BRONCHIAL NEEDLE ASPIRATION BIOPSIES;  Surgeon: Collene Gobble, MD;  Location: [facility] ENDOSCOPY;  Service: Pulmonary;;   LUMBAR LAMINECTOMY N/A 05/13/2018   Procedure: LEFT LUMBAR FOUR-FIVE MICRODISCECTOMY;  Surgeon: Marybelle Killings, MD;  Location: [facility];  Service: Orthopedics;  Laterality: N/A;   TUBAL LIGATION     TUBAL LIGATION  1998   VIDEO BRONCHOSCOPY WITH ENDOBRONCHIAL ULTRASOUND N/A 09/21/2021   Procedure: VIDEO BRONCHOSCOPY WITH ENDOBRONCHIAL ULTRASOUND;  Surgeon: Collene Gobble, MD;  Location: [facility] ENDOSCOPY;  Service: Pulmonary;  Laterality: N/A;    REVIEW OF SYSTEMS:  A comprehensive review of systems was negative except for: Constitutional: positive for anorexia, fatigue, and weight loss Gastrointestinal: positive for odynophagia   PHYSICAL EXAMINATION: General appearance: alert, cooperative, fatigued, and no distress Head: Normocephalic, without obvious abnormality, atraumatic Neck: no adenopathy, no JVD, supple, symmetrical, trachea midline, and thyroid not enlarged, symmetric, no tenderness/mass/nodules Lymph nodes: Cervical, supraclavicular, and axillary nodes normal. Resp: clear to auscultation bilaterally Back: symmetric, no curvature. ROM normal. No CVA tenderness. Cardio: regular rate and rhythm, S1, S2 normal, no murmur, click, rub or gallop GI: soft,  non-tender; bowel sounds normal; no masses,  no organomegaly Extremities: extremities normal, atraumatic, no cyanosis or edema  ECOG PERFORMANCE STATUS: 1 - Symptomatic but completely ambulatory  Blood pressure 105/74, pulse (!) 114, temperature (!) 96.4 F (35.8 C), temperature source Tympanic, resp. rate 19, height 5\' 8"  (1.727 m), weight 166 lb (75.3 kg), last menstrual period 04/24/2018, SpO2 93 %.  LABORATORY DATA: Lab Results  Component Value Date   WBC 3.8 (L) 11/13/2021   HGB 10.9 (L) 11/13/2021   HCT 31.4 (L) 11/13/2021   MCV 97.8 11/13/2021   PLT 392 11/13/2021      Chemistry      Component Value Date/Time   NA 135 11/11/2021 1114   NA 139 02/18/2017 0932   K 3.4 (L) 11/11/2021 1114   CL 110 11/11/2021 1114   CO2 18 (L) 11/11/2021 1114   BUN 12 11/11/2021 1114   BUN 11 02/18/2017 0932   CREATININE 0.90 11/11/2021 1114   CREATININE 0.85 11/06/2021 0951      Component Value Date/Time   CALCIUM 9.4 11/11/2021 1114   ALKPHOS 77 11/11/2021 1114   AST  11 (L) 11/11/2021 1114   AST 13 (L) 11/06/2021 0951   ALT 10 11/11/2021 1114   ALT 12 11/06/2021 0951   BILITOT 0.5 11/11/2021 1114   BILITOT 0.3 11/06/2021 0951       RADIOGRAPHIC STUDIES: No results found.  ASSESSMENT AND PLAN: This is a very pleasant 50 years old white female recently diagnosed with limited stage (T3, N2, MX) small cell lung cancer presented with right hilar/suprahilar mass with occlusion of the right upper lobe bronchus in addition to right paratracheal, right infrahilar and subcarinal lymphadenopathy as well as right upper lobe and right apical pulmonary nodules diagnosed in October 2022. The patient had MRI of the brain performed recently that showed no evidence of metastatic disease to the brain.   The patient is started systemic chemotherapy with cisplatin 80 Mg/M2 on day 1 and 2 etoposide 100 Mg/M2 on days 1, 2 and 3 status post 2 cycles.  This is concurrent with radiotherapy.  She is  expected to complete the last fraction of radiotherapy today. The patient has been tolerating her systemic chemotherapy fairly well. I recommended for her to proceed with cycle #3 today as planned. I will see her back for follow-up visit in 3 weeks for evaluation before starting cycle #4. For the dehydration and odynophagia, I will arrange for the patient to receive IV fluids on Friday, November 16, 2021. She was advised to call immediately if she has any other concerning symptoms in the interval. The patient voices understanding of current disease status and treatment options and is in agreement with the current care plan.  All questions were answered. The patient knows to call the clinic with any problems, questions or concerns. We can certainly see the patient much sooner if necessary.  Disclaimer: This note was dictated with voice recognition software. Similar sounding words can inadvertently be transcribed and may not be corrected upon review.

## 2021-11-13 NOTE — Progress Notes (Signed)
Per Dr. Julien Nordmann, it is ok to treat pt today with Cisplatin and VP-16 and urine output of 100 ml.

## 2021-11-14 ENCOUNTER — Other Ambulatory Visit (HOSPITAL_COMMUNITY): Payer: Self-pay

## 2021-11-14 ENCOUNTER — Inpatient Hospital Stay: Payer: Medicaid Other

## 2021-11-14 VITALS — BP 123/89 | HR 93 | Temp 98.2°F | Resp 16

## 2021-11-14 DIAGNOSIS — Z5111 Encounter for antineoplastic chemotherapy: Secondary | ICD-10-CM | POA: Diagnosis not present

## 2021-11-14 DIAGNOSIS — C3411 Malignant neoplasm of upper lobe, right bronchus or lung: Secondary | ICD-10-CM

## 2021-11-14 MED ORDER — SODIUM CHLORIDE 0.9 % IV SOLN: Freq: Once | INTRAVENOUS | Status: AC

## 2021-11-14 MED ORDER — SODIUM CHLORIDE 0.9 % IV SOLN
10.0000 mg | Freq: Once | INTRAVENOUS | Status: AC
  Administered 2021-11-14: 14:00:00 10 mg via INTRAVENOUS
  Filled 2021-11-14: qty 10

## 2021-11-14 MED ORDER — SODIUM CHLORIDE 0.9 % IV SOLN
100.0000 mg/m2 | Freq: Once | INTRAVENOUS | Status: AC
  Administered 2021-11-14: 15:00:00 190 mg via INTRAVENOUS
  Filled 2021-11-14: qty 9.5

## 2021-11-14 NOTE — Patient Instructions (Signed)
[facility]COL[facility]  Discharge Instructions: Thank you for choosing [facility] to provide your oncology and hematology care.   If you have a lab appointment with the [facility], please go directly to the [facility] and check in at the registration area.   Wear comfortable clothing and clothing appropriate for easy access to any Portacath or PICC line.   We strive to give you quality time with your provider. You may need to reschedule your appointment if you arrive late (15 or more minutes).  Arriving late affects you and other patients whose appointments are after yours.  Also, if you miss three or more appointments without notifying the office, you may be dismissed from the clinic at the providers discretion.      For prescription refill requests, have your pharmacy contact our office and allow 72 hours for refills to be completed.    Today you received the following chemotherapy and/or immunotherapy agent: Etoposide.   To help prevent nausea and vomiting after your treatment, we encourage you to take your nausea medication as directed.  BELOW ARE SYMPTOMS THAT SHOULD BE REPORTED IMMEDIATELY: *FEVER GREATER THAN 100.4 F (38 C) OR HIGHER *CHILLS OR SWEATING *NAUSEA AND VOMITING THAT IS NOT CONTROLLED WITH YOUR NAUSEA MEDICATION *UNUSUAL SHORTNESS OF BREATH *UNUSUAL BRUISING OR BLEEDING *URINARY PROBLEMS (pain or burning when urinating, or frequent urination) *BOWEL PROBLEMS (unusual diarrhea, constipation, pain near the anus) TENDERNESS IN MOUTH AND THROAT WITH OR WITHOUT PRESENCE OF ULCERS (sore throat, sores in mouth, or a toothache) UNUSUAL RASH, SWELLING OR PAIN  UNUSUAL VAGINAL DISCHARGE OR ITCHING   Items with * indicate a potential emergency and should be followed up as soon as possible or go to the Emergency Department if any problems should occur.  Please show the CHEMOTHERAPY ALERT CARD or IMMUNOTHERAPY ALERT CARD at check-in to the  Emergency Department and triage nurse.  Should you have questions after your visit or need to cancel or reschedule your appointment, please contact [facility]  Dept: [phone]  and follow the prompts.  Office hours are 8:00 a.m. to 4:30 p.m. Monday - Friday. Please note that voicemails left after 4:00 p.m. may not be returned until the following business day.  We are closed weekends and major holidays. You have access to a nurse at all times for urgent questions. Please call the main number to the clinic Dept: [phone] and follow the prompts.   For any non-urgent questions, you may also contact your provider using MyChart. We now offer e-Visits for anyone 33 and older to request care online for non-urgent symptoms. For details visit [email or web address].   Also download the MyChart app! Go to the app store, search "MyChart", open the app, select [facility], and log in with your MyChart username and password.  Due to Covid, a mask is required upon entering the hospital/clinic. If you do not have a mask, one will be given to you upon arrival. For doctor visits, patients may have 1 support person aged 108 or older with them. For treatment visits, patients cannot have anyone with them due to current Covid guidelines and our immunocompromised population.

## 2021-11-15 ENCOUNTER — Other Ambulatory Visit (HOSPITAL_COMMUNITY): Payer: Self-pay

## 2021-11-15 ENCOUNTER — Inpatient Hospital Stay: Payer: Medicaid Other

## 2021-11-15 ENCOUNTER — Other Ambulatory Visit: Payer: Self-pay

## 2021-11-15 ENCOUNTER — Other Ambulatory Visit: Payer: Self-pay | Admitting: Medical Oncology

## 2021-11-15 VITALS — BP 133/81 | HR 98 | Temp 98.7°F | Resp 16

## 2021-11-15 DIAGNOSIS — Z5111 Encounter for antineoplastic chemotherapy: Secondary | ICD-10-CM | POA: Diagnosis not present

## 2021-11-15 DIAGNOSIS — B370 Candidal stomatitis: Secondary | ICD-10-CM

## 2021-11-15 DIAGNOSIS — C3411 Malignant neoplasm of upper lobe, right bronchus or lung: Secondary | ICD-10-CM

## 2021-11-15 MED ORDER — SODIUM CHLORIDE 0.9 % IV SOLN: Freq: Once | INTRAVENOUS | Status: AC

## 2021-11-15 MED ORDER — SODIUM CHLORIDE 0.9 % IV SOLN
100.0000 mg/m2 | Freq: Once | INTRAVENOUS | Status: AC
  Administered 2021-11-15: 16:00:00 190 mg via INTRAVENOUS
  Filled 2021-11-15: qty 9.5

## 2021-11-15 MED ORDER — SODIUM CHLORIDE 0.9 % IV SOLN
10.0000 mg | Freq: Once | INTRAVENOUS | Status: AC
  Administered 2021-11-15: 15:00:00 10 mg via INTRAVENOUS
  Filled 2021-11-15: qty 10

## 2021-11-15 MED ORDER — NYSTATIN 100000 UNIT/ML MT SUSP
5.0000 mL | Freq: Four times a day (QID) | OROMUCOSAL | 0 refills | Status: DC | PRN
  Filled 2021-11-15: qty 240, 8d supply, fill #0

## 2021-11-15 NOTE — Patient Instructions (Signed)
[facility] ONCOL[facility]  Discharge Instructions: Thank you for choosing [facility] to provide your oncology and hematology care.   If you have a lab appointment with the [facility], please go directly to the [facility] and check in at the registration area.   Wear comfortable clothing and clothing appropriate for easy access to any Portacath or PICC line.   We strive to give you quality time with your provider. You may need to reschedule your appointment if you arrive late (15 or more minutes).  Arriving late affects you and other patients whose appointments are after yours.  Also, if you miss three or more appointments without notifying the office, you may be dismissed from the clinic at the providers discretion.      For prescription refill requests, have your pharmacy contact our office and allow 72 hours for refills to be completed.    Today you received the following chemotherapy and/or immunotherapy agent: Etoposide     To help prevent nausea and vomiting after your treatment, we encourage you to take your nausea medication as directed.  BELOW ARE SYMPTOMS THAT SHOULD BE REPORTED IMMEDIATELY: *FEVER GREATER THAN 100.4 F (38 C) OR HIGHER *CHILLS OR SWEATING *NAUSEA AND VOMITING THAT IS NOT CONTROLLED WITH YOUR NAUSEA MEDICATION *UNUSUAL SHORTNESS OF BREATH *UNUSUAL BRUISING OR BLEEDING *URINARY PROBLEMS (pain or burning when urinating, or frequent urination) *BOWEL PROBLEMS (unusual diarrhea, constipation, pain near the anus) TENDERNESS IN MOUTH AND THROAT WITH OR WITHOUT PRESENCE OF ULCERS (sore throat, sores in mouth, or a toothache) UNUSUAL RASH, SWELLING OR PAIN  UNUSUAL VAGINAL DISCHARGE OR ITCHING   Items with * indicate a potential emergency and should be followed up as soon as possible or go to the Emergency Department if any problems should occur.  Please show the CHEMOTHERAPY ALERT CARD or IMMUNOTHERAPY ALERT CARD at check-in to  the Emergency Department and triage nurse.  Should you have questions after your visit or need to cancel or reschedule your appointment, please contact [facility]  Dept: [phone]  and follow the prompts.  Office hours are 8:00 a.m. to 4:30 p.m. Monday - Friday. Please note that voicemails left after 4:00 p.m. may not be returned until the following business day.  We are closed weekends and major holidays. You have access to a nurse at all times for urgent questions. Please call the main number to the clinic Dept: [phone] and follow the prompts.   For any non-urgent questions, you may also contact your provider using MyChart. We now offer e-Visits for anyone 58 and older to request care online for non-urgent symptoms. For details visit [email or web address].   Also download the MyChart app! Go to the app store, search "MyChart", open the app, select , and log in with your MyChart username and password.  Due to Covid, a mask is required upon entering the hospital/clinic. If you do not have a mask, one will be given to you upon arrival. For doctor visits, patients may have 1 support person aged 64 or older with them. For treatment visits, patients cannot have anyone with them due to current Covid guidelines and our immunocompromised population.

## 2021-11-15 NOTE — Progress Notes (Signed)
Pt has poor venous access. Per Dr Julien Nordmann , it is okay to leave in her peripheral IV for IV fluids tomorrow.

## 2021-11-16 ENCOUNTER — Other Ambulatory Visit: Payer: Self-pay

## 2021-11-16 ENCOUNTER — Inpatient Hospital Stay: Payer: Medicaid Other

## 2021-11-16 VITALS — BP 108/66 | HR 108 | Temp 98.4°F | Resp 18

## 2021-11-16 DIAGNOSIS — E860 Dehydration: Secondary | ICD-10-CM

## 2021-11-16 DIAGNOSIS — Z5111 Encounter for antineoplastic chemotherapy: Secondary | ICD-10-CM | POA: Diagnosis not present

## 2021-11-16 MED ORDER — SODIUM CHLORIDE 0.9 % IV SOLN: Freq: Once | INTRAVENOUS | Status: AC

## 2021-11-16 MED ORDER — ONDANSETRON HCL 4 MG/2ML IJ SOLN
8.0000 mg | Freq: Once | INTRAMUSCULAR | Status: AC
  Administered 2021-11-16: 12:00:00 8 mg via INTRAVENOUS
  Filled 2021-11-16: qty 4

## 2021-11-16 MED ORDER — SODIUM CHLORIDE 0.9 % IV SOLN: Freq: Once | INTRAVENOUS | Status: DC

## 2021-11-16 NOTE — Patient Instructions (Signed)

## 2021-11-20 ENCOUNTER — Telehealth: Payer: Self-pay | Admitting: Medical Oncology

## 2021-11-20 ENCOUNTER — Other Ambulatory Visit: Payer: Self-pay

## 2021-11-20 ENCOUNTER — Inpatient Hospital Stay: Payer: Medicaid Other

## 2021-11-20 DIAGNOSIS — E876 Hypokalemia: Secondary | ICD-10-CM

## 2021-11-20 DIAGNOSIS — C3411 Malignant neoplasm of upper lobe, right bronchus or lung: Secondary | ICD-10-CM

## 2021-11-20 DIAGNOSIS — Z5111 Encounter for antineoplastic chemotherapy: Secondary | ICD-10-CM | POA: Diagnosis not present

## 2021-11-20 LAB — CMP (CANCER CENTER ONLY)
ALT: 11 U/L (ref 0–44)
AST: 11 U/L — ABNORMAL LOW (ref 15–41)
Albumin: 3.8 g/dL (ref 3.5–5.0)
Alkaline Phosphatase: 66 U/L (ref 38–126)
Anion gap: 10 (ref 5–15)
BUN: 18 mg/dL (ref 6–20)
CO2: 25 mmol/L (ref 22–32)
Calcium: 9.5 mg/dL (ref 8.9–10.3)
Chloride: 103 mmol/L (ref 98–111)
Creatinine: 0.83 mg/dL (ref 0.44–1.00)
GFR, Estimated: >60 mL/min (ref >60)
Glucose, Bld: 109 mg/dL — ABNORMAL HIGH (ref 70–99)
Potassium: 2.7 mmol/L — CL (ref 3.5–5.1)
Sodium: 138 mmol/L (ref 135–145)
Total Bilirubin: 1 mg/dL (ref 0.3–1.2)
Total Protein: 7.1 g/dL (ref 6.5–8.1)

## 2021-11-20 LAB — CBC WITH DIFFERENTIAL (CANCER CENTER ONLY)
Abs Immature Granulocytes: 0.05 10*3/uL (ref 0.00–0.07)
Basophils Absolute: 0 10*3/uL (ref 0.0–0.1)
Basophils Relative: 0 %
Eosinophils Absolute: 0 10*3/uL (ref 0.0–0.5)
Eosinophils Relative: 0 %
HCT: 25.5 % — ABNORMAL LOW (ref 36.0–46.0)
Hemoglobin: 9 g/dL — ABNORMAL LOW (ref 12.0–15.0)
Immature Granulocytes: 2 %
Lymphocytes Relative: 10 %
Lymphs Abs: 0.3 10*3/uL — ABNORMAL LOW (ref 0.7–4.0)
MCH: 34.4 pg — ABNORMAL HIGH (ref 26.0–34.0)
MCHC: 35.3 g/dL (ref 30.0–36.0)
MCV: 97.3 fL (ref 80.0–100.0)
Monocytes Absolute: 0.1 10*3/uL (ref 0.1–1.0)
Monocytes Relative: 2 %
Neutro Abs: 2.9 10*3/uL (ref 1.7–7.7)
Neutrophils Relative %: 86 %
Platelet Count: 166 10*3/uL (ref 150–400)
RBC: 2.62 MIL/uL — ABNORMAL LOW (ref 3.87–5.11)
RDW: 13.9 % (ref 11.5–15.5)
WBC Count: 3.3 10*3/uL — ABNORMAL LOW (ref 4.0–10.5)
nRBC: 0 % (ref 0.0–0.2)

## 2021-11-20 LAB — MAGNESIUM: Magnesium: 1 mg/dL — ABNORMAL LOW (ref 1.7–2.4)

## 2021-11-20 MED ORDER — POTASSIUM CHLORIDE CRYS ER 20 MEQ PO TBCR: 40.0000 meq | EXTENDED_RELEASE_TABLET | Freq: Every day | ORAL | 0 refills | Status: DC

## 2021-11-20 NOTE — Telephone Encounter (Signed)
Low K+-Pt notified to pick up [facility]UR rx and start it today and to keep taking her Magnesium.  Nausea with vomiting after eating or taking pills she vomits -It is usually  less than 8 oz.  She is taking compazine which helps relieve nausea .Last dose was last night.  I offered an appt with [facility] . She said "let me take this compazine or phenergan today and see if it controls N/V".  I asked her to call tomorrow with update to see if she wanted fluids. She said she would call tomorrow.

## 2021-11-27 ENCOUNTER — Telehealth: Payer: Self-pay | Admitting: Internal Medicine

## 2021-11-27 ENCOUNTER — Other Ambulatory Visit (HOSPITAL_COMMUNITY): Payer: Self-pay

## 2021-11-27 ENCOUNTER — Other Ambulatory Visit: Payer: Self-pay

## 2021-11-27 ENCOUNTER — Telehealth: Payer: Self-pay

## 2021-11-27 ENCOUNTER — Ambulatory Visit: Payer: Medicaid Other

## 2021-11-27 ENCOUNTER — Other Ambulatory Visit: Payer: Self-pay | Admitting: Medical Oncology

## 2021-11-27 ENCOUNTER — Inpatient Hospital Stay: Payer: Medicaid Other | Attending: Internal Medicine

## 2021-11-27 ENCOUNTER — Other Ambulatory Visit: Payer: Self-pay | Admitting: Internal Medicine

## 2021-11-27 DIAGNOSIS — R0609 Other forms of dyspnea: Secondary | ICD-10-CM | POA: Insufficient documentation

## 2021-11-27 DIAGNOSIS — R112 Nausea with vomiting, unspecified: Secondary | ICD-10-CM | POA: Diagnosis not present

## 2021-11-27 DIAGNOSIS — R5383 Other fatigue: Secondary | ICD-10-CM | POA: Insufficient documentation

## 2021-11-27 DIAGNOSIS — R059 Cough, unspecified: Secondary | ICD-10-CM | POA: Insufficient documentation

## 2021-11-27 DIAGNOSIS — E876 Hypokalemia: Secondary | ICD-10-CM | POA: Diagnosis not present

## 2021-11-27 DIAGNOSIS — Z79899 Other long term (current) drug therapy: Secondary | ICD-10-CM | POA: Insufficient documentation

## 2021-11-27 DIAGNOSIS — Z5111 Encounter for antineoplastic chemotherapy: Secondary | ICD-10-CM | POA: Insufficient documentation

## 2021-11-27 DIAGNOSIS — T451X5A Adverse effect of antineoplastic and immunosuppressive drugs, initial encounter: Secondary | ICD-10-CM | POA: Diagnosis not present

## 2021-11-27 DIAGNOSIS — K208 Other esophagitis without bleeding: Secondary | ICD-10-CM | POA: Diagnosis not present

## 2021-11-27 DIAGNOSIS — D649 Anemia, unspecified: Secondary | ICD-10-CM

## 2021-11-27 DIAGNOSIS — C3411 Malignant neoplasm of upper lobe, right bronchus or lung: Secondary | ICD-10-CM | POA: Insufficient documentation

## 2021-11-27 DIAGNOSIS — R61 Generalized hyperhidrosis: Secondary | ICD-10-CM | POA: Diagnosis not present

## 2021-11-27 DIAGNOSIS — E860 Dehydration: Secondary | ICD-10-CM | POA: Insufficient documentation

## 2021-11-27 DIAGNOSIS — E878 Other disorders of electrolyte and fluid balance, not elsewhere classified: Secondary | ICD-10-CM | POA: Diagnosis not present

## 2021-11-27 LAB — CBC WITH DIFFERENTIAL (CANCER CENTER ONLY)
Abs Immature Granulocytes: 0.03 10*3/uL (ref 0.00–0.07)
Basophils Absolute: 0 10*3/uL (ref 0.0–0.1)
Basophils Relative: 0 %
Eosinophils Absolute: 0 10*3/uL (ref 0.0–0.5)
Eosinophils Relative: 2 %
HCT: 20.2 % — ABNORMAL LOW (ref 36.0–46.0)
Hemoglobin: 7.2 g/dL — ABNORMAL LOW (ref 12.0–15.0)
Immature Granulocytes: 3 %
Lymphocytes Relative: 39 %
Lymphs Abs: 0.4 10*3/uL — ABNORMAL LOW (ref 0.7–4.0)
MCH: 34.1 pg — ABNORMAL HIGH (ref 26.0–34.0)
MCHC: 35.6 g/dL (ref 30.0–36.0)
MCV: 95.7 fL (ref 80.0–100.0)
Monocytes Absolute: 0.5 10*3/uL (ref 0.1–1.0)
Monocytes Relative: 45 %
Neutro Abs: 0.1 10*3/uL — CL (ref 1.7–7.7)
Neutrophils Relative %: 11 %
Platelet Count: 43 10*3/uL — ABNORMAL LOW (ref 150–400)
RBC: 2.11 MIL/uL — ABNORMAL LOW (ref 3.87–5.11)
RDW: 13.2 % (ref 11.5–15.5)
Smear Review: NORMAL
WBC Count: 1.1 10*3/uL — ABNORMAL LOW (ref 4.0–10.5)
nRBC: 0 % (ref 0.0–0.2)

## 2021-11-27 LAB — CMP (CANCER CENTER ONLY)
ALT: 5 U/L (ref 0–44)
AST: 7 U/L — ABNORMAL LOW (ref 15–41)
Albumin: 3.7 g/dL (ref 3.5–5.0)
Alkaline Phosphatase: 86 U/L (ref 38–126)
Anion gap: 14 (ref 5–15)
BUN: 8 mg/dL (ref 6–20)
CO2: 17 mmol/L — ABNORMAL LOW (ref 22–32)
Calcium: 9.1 mg/dL (ref 8.9–10.3)
Chloride: 101 mmol/L (ref 98–111)
Creatinine: 1 mg/dL (ref 0.44–1.00)
GFR, Estimated: >60 mL/min (ref >60)
Glucose, Bld: 116 mg/dL — ABNORMAL HIGH (ref 70–99)
Potassium: 3 mmol/L — ABNORMAL LOW (ref 3.5–5.1)
Sodium: 132 mmol/L — ABNORMAL LOW (ref 135–145)
Total Bilirubin: 0.6 mg/dL (ref 0.3–1.2)
Total Protein: 7.4 g/dL (ref 6.5–8.1)

## 2021-11-27 LAB — MAGNESIUM: Magnesium: 1 mg/dL — ABNORMAL LOW (ref 1.7–2.4)

## 2021-11-27 MED ORDER — MAGNESIUM SULFATE 4 GM/100ML IV SOLN
4.0000 g | Freq: Once | INTRAVENOUS | Status: DC
  Filled 2021-11-27: qty 100

## 2021-11-27 MED ORDER — SODIUM CHLORIDE 0.9 % IV SOLN: 4.0000 g | Freq: Once | INTRAVENOUS | Status: DC

## 2021-11-27 NOTE — Progress Notes (Signed)
Type and screen ,ABO and prepare released.

## 2021-11-27 NOTE — Telephone Encounter (Signed)
Scheduled per 01/03 scheduled message, patient has been called and notified of all upcoming appointments this week.

## 2021-11-27 NOTE — Addendum Note (Signed)
Addended by: Margaret Pyle on: 11/27/2021 10:22 AM   Modules accepted: Orders

## 2021-11-27 NOTE — Telephone Encounter (Signed)
Pt was here for labs and states she doesn't feel well. She states she has a cough and overall does not feel well. Pt was advised we can see her in [facility] but they will not open until 10am. Pt agreed to wait to be seen but then left at 9:30am stating she no longer wanted to wait.   After pt left, call was received indicating the following:  CRITICAL VALUE STICKER  CRITICAL VALUE: ANC = 0.1  RECEIVER (on-site recipient of call): Abelina Bachelor, RN  [place] NOTIFIED: 11/27/21 at 10:04am  MESSENGER (representative from lab): Pam  MD NOTIFIED: [facility]  TIME OF NOTIFICATION: 11/27/21  RESPONSE: Notification given to dR. Mohamed for follow-up with pt.

## 2021-11-28 ENCOUNTER — Inpatient Hospital Stay: Payer: Medicaid Other

## 2021-11-28 ENCOUNTER — Other Ambulatory Visit: Payer: Self-pay | Admitting: Medical Oncology

## 2021-11-28 ENCOUNTER — Inpatient Hospital Stay (HOSPITAL_BASED_OUTPATIENT_CLINIC_OR_DEPARTMENT_OTHER): Payer: Medicaid Other

## 2021-11-28 ENCOUNTER — Ambulatory Visit (HOSPITAL_COMMUNITY)
Admission: RE | Admit: 2021-11-28 | Discharge: 2021-11-28 | Disposition: A | Discharge status: Home / Self Care | Payer: Medicaid Other | Source: Ambulatory Visit | Attending: Physician Assistant | Admitting: Physician Assistant

## 2021-11-28 ENCOUNTER — Ambulatory Visit (HOSPITAL_BASED_OUTPATIENT_CLINIC_OR_DEPARTMENT_OTHER): Payer: Medicaid Other | Admitting: Physician Assistant

## 2021-11-28 ENCOUNTER — Other Ambulatory Visit: Payer: Self-pay | Admitting: Physician Assistant

## 2021-11-28 VITALS — BP 107/71 | HR 107 | Temp 98.3°F | Resp 20

## 2021-11-28 DIAGNOSIS — C3411 Malignant neoplasm of upper lobe, right bronchus or lung: Secondary | ICD-10-CM

## 2021-11-28 DIAGNOSIS — D709 Neutropenia, unspecified: Secondary | ICD-10-CM

## 2021-11-28 DIAGNOSIS — D649 Anemia, unspecified: Secondary | ICD-10-CM

## 2021-11-28 DIAGNOSIS — R051 Acute cough: Secondary | ICD-10-CM | POA: Diagnosis present

## 2021-11-28 DIAGNOSIS — E860 Dehydration: Secondary | ICD-10-CM

## 2021-11-28 LAB — ABO/RH: ABO/RH(D): B POS

## 2021-11-28 LAB — PREPARE RBC (CROSSMATCH)

## 2021-11-28 MED ORDER — FILGRASTIM-SNDZ 300 MCG/0.5ML IJ SOSY
300.0000 ug | PREFILLED_SYRINGE | Freq: Once | INTRAMUSCULAR | Status: AC
  Administered 2021-11-28: 300 ug via SUBCUTANEOUS
  Filled 2021-11-28: qty 0.5

## 2021-11-28 MED ORDER — TBO-FILGRASTIM 300 MCG/0.5ML ~~LOC~~ SOSY
300.0000 ug | PREFILLED_SYRINGE | Freq: Once | SUBCUTANEOUS | Status: DC
  Filled 2021-11-28: qty 0.5

## 2021-11-28 MED ORDER — DIPHENHYDRAMINE HCL 25 MG PO CAPS
25.0000 mg | ORAL_CAPSULE | Freq: Once | ORAL | Status: AC
  Administered 2021-11-28: 25 mg via ORAL
  Filled 2021-11-28: qty 1

## 2021-11-28 MED ORDER — SODIUM CHLORIDE 0.9 % IV SOLN: 2.0000 g | Freq: Once | INTRAVENOUS | Status: DC

## 2021-11-28 MED ORDER — ACETAMINOPHEN 325 MG PO TABS
650.0000 mg | ORAL_TABLET | Freq: Once | ORAL | Status: AC
  Administered 2021-11-28: 650 mg via ORAL
  Filled 2021-11-28: qty 2

## 2021-11-28 MED ORDER — SODIUM CHLORIDE 0.9 % IV SOLN: Freq: Once | INTRAVENOUS | Status: DC

## 2021-11-28 MED ORDER — SODIUM CHLORIDE 0.9 % IV SOLN: INTRAVENOUS | Status: AC

## 2021-11-28 MED ORDER — ONDANSETRON HCL 4 MG/2ML IJ SOLN
8.0000 mg | Freq: Once | INTRAMUSCULAR | Status: AC
  Administered 2021-11-28: 8 mg via INTRAVENOUS
  Filled 2021-11-28: qty 4

## 2021-11-28 MED ORDER — HYDROCODONE BIT-HOMATROP MBR 5-1.5 MG/5ML PO SOLN: 5.0000 mL | Freq: Four times a day (QID) | ORAL | 0 refills | Status: DC | PRN

## 2021-11-28 MED ORDER — MAGNESIUM SULFATE 2 GM/50ML IV SOLN
2.0000 g | Freq: Once | INTRAVENOUS | Status: AC
  Administered 2021-11-28: 2 g via INTRAVENOUS
  Filled 2021-11-28: qty 50

## 2021-11-28 MED ORDER — SODIUM CHLORIDE 0.9% IV SOLUTION
250.0000 mL | Freq: Once | INTRAVENOUS | Status: AC
  Administered 2021-11-28: 250 mL via INTRAVENOUS

## 2021-11-28 MED ORDER — AMOXICILLIN-POT CLAVULANATE 875-125 MG PO TABS
1.0000 | ORAL_TABLET | Freq: Two times a day (BID) | ORAL | 0 refills | Status: AC
Start: 2021-11-28 — End: 2021-12-05

## 2021-11-28 MED ORDER — DOXYCYCLINE HYCLATE 100 MG PO TABS: 100.0000 mg | ORAL_TABLET | Freq: Two times a day (BID) | ORAL | 0 refills | Status: AC

## 2021-11-28 NOTE — Progress Notes (Signed)
Zarxio has been substituted for granix.  Larene Beach, PharmD

## 2021-11-28 NOTE — Progress Notes (Signed)
Symptom Management Consult note [name]    Patient Care Team: Etter Sjogren as PCP - General (Physician Assistant)    Name of the patient: [name]  [id number]  07/26/1971   Date of visit: 11/28/2021    Chief complaint/ Reason for visit- cough  Oncology History  Malignant neoplasm of right upper lobe of lung ([place])  09/05/2021 Initial Diagnosis   Malignant neoplasm of right upper lobe of lung ([place])   09/12/2021 Cancer Staging   Staging form: Lung, AJCC 8th Edition - Clinical: Stage IVA (cT4, cN2, cM1a) - Signed by Curt Bears, MD on 09/12/2021    10/03/2021 -  Chemotherapy   Patient is on Treatment Plan : LUNG SMALL CELL Cisplatin D1 + Etoposide D1-3 q21d       Current Therapy: cisplatin day 1 cycle 3 on 11/13/21, etoposide on day 3 cycle 3 11/15/21  Interval history- [name] is a 51 yo female with oncologic history as listed above seen in the infusion center today with chief complaint of cough, congestion, and chills x several weeks. Patient states symptoms started before Christmas although she does not remember exactly when. Her symptoms have worsened over the last week.  Her cough is sometimes productive with white phlegm. he states her cough is keeping her awake at night and she is tired from lack of sleep. She states her cough sounds wet and she feels congestion in her chest. She has not tried taking OTC medications prior to arrival. Denies any sick contacts or covid exposures. She states she has been having a sore throat and she did report finishing radiation x 2 weeks ago. She can tolerate sips of water and food although states it is painful. She hopes pain will improve now that she is done with radiation. She has prescription for Carafate at home although does not take it because it makes her vomit. She denies fever, weight loss, night sweats, nasal congestion, otalgia, chest pain, shortness of breath, abdominal pain, nausea,  emesis, urinary symptom, diarrhea, rash.   ROS  All other systems are reviewed and are negative for acute change except as noted in the HPI.    Allergies  Allergen Reactions   Naproxen Nausea Only   Bactrim [Sulfamethoxazole-Trimethoprim] Nausea Only     Past Medical History:  Diagnosis Date   Ankle sprain    left   Anxiety    treated for panic attacks in the past.   Asthma    COPD (chronic obstructive pulmonary disease) (HCC)    Depression    Irregular heart rate    Lung cancer (HCC)    Renal disorder    kidney infections     Past Surgical History:  Procedure Laterality Date   APPENDECTOMY     BRONCHIAL BIOPSY  09/21/2021   Procedure: BRONCHIAL BIOPSIES;  Surgeon: Collene Gobble, MD;  Location: [place];  Service: Pulmonary;;   BRONCHIAL BRUSHINGS  09/21/2021   Procedure: BRONCHIAL BRUSHINGS;  Surgeon: Collene Gobble, MD;  Location: [place];  Service: Pulmonary;;   BRONCHIAL NEEDLE ASPIRATION BIOPSY  09/21/2021   Procedure: BRONCHIAL NEEDLE ASPIRATION BIOPSIES;  Surgeon: Collene Gobble, MD;  Location: [facility];  Service: Pulmonary;;   LUMBAR LAMINECTOMY N/A 05/13/2018   Procedure: LEFT LUMBAR FOUR-FIVE MICRODISCECTOMY;  Surgeon: Marybelle Killings, MD;  Location: [place];  Service: Orthopedics;  Laterality: N/A;   TUBAL LIGATION     TUBAL LIGATION  1998   VIDEO BRONCHOSCOPY WITH  ENDOBRONCHIAL ULTRASOUND N/A 09/21/2021   Procedure: VIDEO BRONCHOSCOPY WITH ENDOBRONCHIAL ULTRASOUND;  Surgeon: Collene Gobble, MD;  Location: [facility];  Service: Pulmonary;  Laterality: N/A;    Social History   Socioeconomic History   Marital status: Legally Separated    Spouse name: Not on file   Number of children: Not on file   Years of education: Not on file   Highest education level: Not on file  Occupational History   Not on file  Tobacco Use   Smoking status: Every Day    Packs/day: 0.50    Years: 30.00    Pack years: 15.00    Types: Cigarettes   Smokeless  tobacco: Never  Vaping Use   Vaping Use: Never used  Substance and Sexual Activity   Alcohol use: Yes    Comment: rare   Drug use: Not Currently    Types: Marijuana    Comment: occ-   Sexual activity: Not on file  Other Topics Concern   Not on file  Social History Narrative   Not on file   Social Determinants of Health   Financial Resource Strain: Not on file  Food Insecurity: Not on file  Transportation Needs: Not on file  Physical Activity: Not on file  Stress: Not on file  Social Connections: Not on file  Intimate Partner Violence: Not on file    Family History  Problem Relation Age of Onset   Hypertension Mother    Hypertension Father    Heart disease Father      Current Outpatient Medications:    amoxicillin-clavulanate (AUGMENTIN) 875-125 MG tablet, Take 1 tablet by mouth 2 (two) times daily for 7 days., Disp: 14 tablet, Rfl: 0   doxycycline (VIBRA-TABS) 100 MG tablet, Take 1 tablet (100 mg total) by mouth 2 (two) times daily for 7 days., Disp: 14 tablet, Rfl: 0   HYDROcodone bit-homatropine (HYCODAN) 5-1.5 MG/5ML syrup, Take 5 mLs by mouth every 6 (six) hours as needed for cough., Disp: 120 mL, Rfl: 0   albuterol (PROVENTIL HFA;VENTOLIN HFA) 108 (90 Base) MCG/ACT inhaler, Inhale 2 puffs into the lungs every 6 (six) hours as needed for wheezing or shortness of breath., Disp: 1 Inhaler, Rfl: 2   ALPRAZolam (XANAX) 0.25 MG tablet, Take 1 tablet (0.25 mg total) by mouth 3 (three) times daily as needed for anxiety, Disp: 30 tablet, Rfl: 0   diphenhydramine-acetaminophen (TYLENOL PM) 25-500 MG TABS tablet, Take 3 tablets by mouth at bedtime as needed (sleep)., Disp: , Rfl:    lidocaine (XYLOCAINE) 2 % solution, Use as directed 15 mLs in the mouth or throat as needed for mouth pain. Swallow ~ 20 minutes prior to meals, dilute as per writtten instructions (Patient not taking: Reported on 11/11/2021), Disp: 100 mL, Rfl: 2   magic mouthwash (nystatin, hydrocortisone,  diphenhydrAMINE) suspension, Take 5 mLs by mouth 4 times daily as needed for mouth pain., Disp: 240 mL, Rfl: 0   magnesium oxide (MAG-OX) 400 (240 Mg) MG tablet, Take 1 tablet (400 mg total) by mouth 3 (three) times daily., Disp: 90 tablet, Rfl: 2   ondansetron (ZOFRAN ODT) 8 MG disintegrating tablet, Dissolve 1 tablet (8 mg total) by mouth every 8 (eight) hours as needed for nausea or vomiting., Disp: 30 tablet, Rfl: 2   oxyCODONE (OXY IR/ROXICODONE) 5 MG immediate release tablet, Take 1 tablet by mouth every 4 hours as needed for severe pain., Disp: 30 tablet, Rfl: 0   oxyCODONE ER (XTAMPZA ER) 18 MG C12A, Take  1 capsule by mouth 2 times daily. (Patient not taking: Reported on 11/11/2021), Disp: 28 capsule, Rfl: 0   oxyCODONE-acetaminophen (PERCOCET/ROXICET) 5-325 MG tablet, Take 1 tablet by mouth every 8 (eight) hours as needed for severe pain. (Patient not taking: Reported on 11/11/2021), Disp: 30 tablet, Rfl: 0   potassium chloride SA (KLOR-CON M) 20 MEQ tablet, Take 2 tablets (40 mEq total) by mouth daily., Disp: 20 tablet, Rfl: 0   prochlorperazine (COMPAZINE) 10 MG tablet, Take 1 tablet (10 mg total) by mouth every 6 (six) hours as needed for nausea or vomiting., Disp: 30 tablet, Rfl: 1   promethazine (PHENERGAN) 25 MG tablet, Take 1 tablet (25 mg total) by mouth every 6 (six) hours as needed for nausea or vomiting., Disp: 30 tablet, Rfl: 0   sucralfate (CARAFATE) 1 g tablet, Take 1 tablet (1 g total) by mouth 4 (four) times daily -  with meals and at bedtime. Crush and dissolve in 10 mL of warm water prior to swallowing, Disp: 120 tablet, Rfl: 1  PHYSICAL EXAM: ECOG FS:1 - Symptomatic but completely ambulatory   T: 98.6 F   BP: 100/60   HR: 111 Resp 18  O2: 99% on room air Physical Exam Vitals and nursing note reviewed.  Constitutional:      Appearance: She is well-developed. She is not ill-appearing or toxic-appearing.  HENT:     Head: Normocephalic and atraumatic.     Comments: No  sinus or temporal tenderness.      Right Ear: External ear normal.     Left Ear: External ear normal.     Nose: Nose normal.  Eyes:     General: No scleral icterus.       Right eye: No discharge.        Left eye: No discharge.     Conjunctiva/sclera: Conjunctivae normal.  Neck:     Vascular: No JVD.  Cardiovascular:     Rate and Rhythm: Regular rhythm. Tachycardia present.     Pulses: Normal pulses.     Heart sounds: Normal heart sounds.  Pulmonary:     Effort: Pulmonary effort is normal.     Comments: Productive cough during exam. Rales heard in bilateral lung bases posteriorly. Speaking full sentences. Abdominal:     General: There is no distension.  Musculoskeletal:        General: Normal range of motion.     Cervical back: Normal range of motion.  Skin:    General: Skin is warm and dry.  Neurological:     Mental Status: She is oriented to person, place, and time.     GCS: GCS eye subscore is 4. GCS verbal subscore is 5. GCS motor subscore is 6.     Comments: Fluent speech, no facial droop.  Psychiatric:        Behavior: Behavior normal.       LABORATORY DATA: I have reviewed the data as listed CBC Latest Ref Rng & Units 11/27/2021 11/20/2021 11/13/2021  WBC 4.0 - 10.5 K/uL 1.1(L) 3.3(L) 3.8(L)  Hemoglobin 12.0 - 15.0 g/dL 7.2(L) 9.0(L) 10.9(L)  Hematocrit 36.0 - 46.0 % 20.2(L) 25.5(L) 31.4(L)  Platelets 150 - 400 K/uL 43(L) 166 392     CMP Latest Ref Rng & Units 11/27/2021 11/20/2021 11/13/2021  Glucose 70 - 99 mg/dL 116(H) 109(H) 99  BUN 6 - 20 mg/dL 8 18 9   Creatinine 0.44 - 1.00 mg/dL 1.00 0.83 0.83  Sodium 135 - 145 mmol/L 132(L) 138 138  Potassium 3.5 -  5.1 mmol/L 3.0(L) 2.7(LL) 3.1(L)  Chloride 98 - 111 mmol/L 101 103 107  CO2 22 - 32 mmol/L 17(L) 25 20(L)  Calcium 8.9 - 10.3 mg/dL 9.1 9.5 9.4  Total Protein 6.5 - 8.1 g/dL 7.4 7.1 7.4  Total Bilirubin 0.3 - 1.2 mg/dL 0.6 1.0 0.3  Alkaline Phos 38 - 126 U/L 86 66 84  AST 15 - 41 U/L 7(L) 11(L) 10(L)   ALT 0 - 44 U/L 5 11 6        RADIOGRAPHIC STUDIES: I have personally reviewed the radiological images as listed and agreed with the findings in the report. No images are attached to the encounter. No results found.   ASSESSMENT & PLAN: Patient is a 51 y.o. female with history of small cell lung cancer followed by oncologist Dr. Julien Nordmann.   #) Cough- Patient non toxic appearing. Afebrile, tachycardic. Looking at previous vitals she has history of tachycardic, not new today. She has no shortness of breath or chest pain. She has productive cough and rales on exam. Chest xray ordered and I will call her with results. As she is having difficulty sleeping from cough Hycodan ordered. I have reviewed the PDMP during this encounter. Patient is out of her narcotic pain medication and knows not to take multiple narcotics at once.     #)Neutropenia-  I viewed labs from yesterday saw WBC 1.1 and ANC 0.1. Patient received Zarxio today ordered by oncologist prior to her clinic visit. Given her symptoms as above and neutropenia will cover with antibiotics. Chart review shows an EKG from 12/22 with prolonged QT therefore will avoid Cipro and treat with Augmentin and doxycyline instead after discussion with pharmacist. Discussed neutropenic precautions.  #)Small cell lung cancer- Patient has next appointment schedule with oncology team 1/10. Continue treatment per oncologist.     ED precautions discussed with patients should she develop new or worsening symptoms.    Visit Diagnosis: 1. Acute cough   2. Neutropenia, unspecified type ([place])   3. Small cell lung cancer, right upper lobe ([place])      Orders Placed This Encounter  Procedures   DG Chest 2 View    Standing Status:   Future    Standing Expiration Date:   11/28/2022    Order Specific Question:   Reason for Exam (SYMPTOM  OR DIAGNOSIS REQUIRED)    Answer:   cough, neutropenia, chills    Order Specific Question:   Is patient pregnant?     Answer:   No    Order Specific Question:   Preferred imaging location?    Answer:   [facility]    All questions were answered. The patient knows to call the clinic with any problems, questions or concerns. No barriers to learning was detected.  I have spent a total of 30 minutes minutes of face-to-face and non-face-to-face time, preparing to see the patient, obtaining and/or reviewing separately obtained history, performing a medically appropriate examination, counseling and educating the patient, ordering tests,  documenting clinical information in the electronic health record, and care coordination.     Thank you for allowing me to participate in the care of this patient.    Barrie Folk, PA-C Department of Hematology/[facility] at [facility] Phone: [phone]  [phone]    11/28/2021 1:33 PM

## 2021-11-28 NOTE — Patient Instructions (Signed)
Blood Transfusion, Adult, Care After This sheet gives you information about how to care for yourself after your procedure. Your doctor may also give you more specific instructions. If you have problems or questions, contact your doctor. What can I expect after the procedure? After the procedure, it is common to have: Bruising and soreness at the IV site. A headache. Follow these instructions at home: Insertion site care   Follow instructions from your doctor about how to take care of your insertion site. This is where an IV tube was put into your vein. Make sure you: Wash your hands with soap and water before and after you change your bandage (dressing). If you cannot use soap and water, use hand sanitizer. Change your bandage as told by your doctor. Check your insertion site every day for signs of infection. Check for: Redness, swelling, or pain. Bleeding from the site. Warmth. Pus or a bad smell. General instructions Take over-the-counter and prescription medicines only as told by your doctor. Rest as told by your doctor. Go back to your normal activities as told by your doctor. Keep all follow-up visits as told by your doctor. This is important. Contact a doctor if: You have itching or red, swollen areas of skin (hives). You feel worried or nervous (anxious). You feel weak after doing your normal activities. You have redness, swelling, warmth, or pain around the insertion site. You have blood coming from the insertion site, and the blood does not stop with pressure. You have pus or a bad smell coming from the insertion site. Get help right away if: You have signs of a serious reaction. This may be coming from an allergy or the body's defense system (immune system). Signs include: Trouble breathing or shortness of breath. Swelling of the face or feeling warm (flushed). Fever or chills. Head, chest, or back pain. Dark pee (urine) or blood in the pee. Widespread rash. Fast  heartbeat. Feeling dizzy or light-headed. You may receive your blood transfusion in an outpatient setting. If so, you will be told whom to contact to report any reactions. These symptoms may be an emergency. Do not wait to see if the symptoms will go away. Get medical help right away. Call your local emergency services (911 in the [place] Do not drive yourself to the hospital. Summary Bruising and soreness at the IV site are common. Check your insertion site every day for signs of infection. Rest as told by your doctor. Go back to your normal activities as told by your doctor. Get help right away if you have signs of a serious reaction. This information is not intended to replace advice given to you by your health care provider. Make sure you discuss any questions you have with your health care provider. Document Revised: 03/08/2021 Document Reviewed: 05/06/2019 Elsevier Patient Education  2022 [employer]  

## 2021-11-28 NOTE — Patient Instructions (Signed)
Chest xray ordered. I will call you with results.  2 antibiotics sent to the pharmacy for you- augmentin and doxycyline. Take as prescribed.  -Narcotic cough medicine Hycodan sent to the pharmacy for you also. Take all medications with food so it does not upset your stomach

## 2021-11-29 ENCOUNTER — Other Ambulatory Visit: Payer: Self-pay

## 2021-11-29 ENCOUNTER — Inpatient Hospital Stay: Payer: Medicaid Other

## 2021-11-29 VITALS — BP 98/80 | HR 122 | Temp 98.1°F | Resp 20

## 2021-11-29 DIAGNOSIS — E860 Dehydration: Secondary | ICD-10-CM

## 2021-11-29 DIAGNOSIS — Z5111 Encounter for antineoplastic chemotherapy: Secondary | ICD-10-CM | POA: Diagnosis not present

## 2021-11-29 LAB — TYPE AND SCREEN
ABO/RH(D): B POS
Antibody Screen: NEGATIVE
Unit division: 0
Unit division: 0

## 2021-11-29 LAB — BPAM RBC
Blood Product Expiration Date: 202301202359
Blood Product Expiration Date: 202301202359
ISSUE DATE / TIME: 202301041206
ISSUE DATE / TIME: 202301041206
Unit Type and Rh: 7300
Unit Type and Rh: 7300

## 2021-11-29 MED ORDER — FILGRASTIM-SNDZ 300 MCG/0.5ML IJ SOSY
300.0000 ug | PREFILLED_SYRINGE | Freq: Once | INTRAMUSCULAR | Status: AC
  Administered 2021-11-29: 300 ug via SUBCUTANEOUS
  Filled 2021-11-29: qty 0.5

## 2021-11-30 ENCOUNTER — Inpatient Hospital Stay: Payer: Medicaid Other

## 2021-11-30 VITALS — BP 117/92 | HR 121 | Temp 98.1°F | Resp 18

## 2021-11-30 DIAGNOSIS — E860 Dehydration: Secondary | ICD-10-CM

## 2021-11-30 DIAGNOSIS — Z5111 Encounter for antineoplastic chemotherapy: Secondary | ICD-10-CM | POA: Diagnosis not present

## 2021-11-30 MED ORDER — FILGRASTIM-SNDZ 300 MCG/0.5ML IJ SOSY
300.0000 ug | PREFILLED_SYRINGE | Freq: Once | INTRAMUSCULAR | Status: AC
  Administered 2021-11-30: 300 ug via SUBCUTANEOUS
  Filled 2021-11-30: qty 0.5

## 2021-12-02 NOTE — Progress Notes (Signed)
[facility] OFFICE PROGRESS NOTE  [name], PA-C [place] [address]  DIAGNOSIS: Limited stage (T3, N2, M0) small cell lung cancer presented with right hilar/suprahilar mass with occlusion of the right upper lobe bronchus in addition to right paratracheal, right infra hilar and subcarinal lymphadenopathy as well as additional right upper lobe and right apical pulmonary nodules.  This was diagnosed in October 2022.  PRIOR THERAPY: None  CURRENT THERAPY: Systemic chemotherapy with cisplatin 80 Mg/M2 on day 1 and etoposide 100 Mg/M2 on days 1, 2 and 3 every 3 weeks.  First dose October 03, 2021.  This will be concurrent with radiotherapy under the care of Dr. Sondra Come. Status post 3 cycles.   INTERVAL HISTORY: [name] 51 y.o. female returns to the clinic today for a follow-up visit accompanied by her mom.  The patient is currently undergoing treatment with systemic chemotherapy and radiation for her limited stage small cell lung cancer.  She completed the radiation portion of her treatment a few weeks ago.  She had developed radiation-induced esophagitis with her treatment.  She continues to report significant esophagitis at this time.  She reportedly is unable to tolerate viscous lidocaine and Carafate.  Reportedly the texture and consistency of this causes emesis.  Dr. Sondra Come was previously prescribing extended release and immediate release oxycodone.  She lost about 10 pound since her last appointment.  She does drink some supplemental drinks but reportedly has a hard time getting even soft consistency food down.  With her last cycle of treatment, the patient developed significant neutropenia with an ANC of 0.1.  She received Zarxio injections daily x3.  She also developed a cough in the interval for which she saw symptom management clinic.  Chest x-ray did not show any acute infection.  Her chest x-ray actually noted a decrease in size of the lung  mass.  She was given Hycodan for cough and insomnia.  She continues to deny any sick contacts, fevers, chills, nasal congestion, or sore throat besides the sore throat from her radiation.  She states the Hycodan is effective for her cough and does help her sleep for a few hours to 2 controlling her cough.  She also received IV fluids and Zofran in the symptom management clinic due to dehydration and poor p.o. intake.  She states the IV fluid was very helpful for her weakness.  She also discussed that the IV antiemetics helped her for a few days.  The patient reportedly only takes her Zofran for antiemetics and does not take the Compazine or Phenergan.  The patient was also found to have significant electrolyte abnormalities with hypomagnesemia and hypokalemia.  The patient did not realize that she was supposed to be taking her potassium chloride and magnesium prescription on an outpatient basis since she was also receiving this in the clinic.  Overall she is feeling fair today.  This may be her last round of chemotherapy and she feels that she is well enough to proceed today as scheduled.  She reports she gets frequent night sweats which has been going on for several months.  Chest pressure exacerbated by coughing since diagnosis.  She has emesis secondary to getting "choked up" on phlegm.  Denies any hemoptysis.  Reports her baseline dyspnea on exertion.  The patient unfortunately continues to smoke cigarettes.  She estimates a pack of cigarettes lasts her 2 to 3 days.  Denies any diarrhea or constipation.  Denies any headache  or visual changes.  She is here today for evaluation and consideration of starting cycle #4 of treatment.  MEDICAL HISTORY: Past Medical History:  Diagnosis Date   Ankle sprain    left   Anxiety    treated for panic attacks in the past.   Asthma    COPD (chronic obstructive pulmonary disease) (HCC)    Depression    Irregular heart rate    Lung cancer (HCC)    Renal disorder     kidney infections    ALLERGIES:  is allergic to naproxen and bactrim [sulfamethoxazole-trimethoprim].  MEDICATIONS:  Current Outpatient Medications  Medication Sig Dispense Refill   albuterol (PROVENTIL HFA;VENTOLIN HFA) 108 (90 Base) MCG/ACT inhaler Inhale 2 puffs into the lungs every 6 (six) hours as needed for wheezing or shortness of breath. 1 Inhaler 2   amoxicillin-clavulanate (AUGMENTIN) 875-125 MG tablet Take 1 tablet by mouth 2 (two) times daily for 7 days. 14 tablet 0   diphenhydramine-acetaminophen (TYLENOL PM) 25-500 MG TABS tablet Take 3 tablets by mouth at bedtime as needed (sleep).     doxycycline (VIBRA-TABS) 100 MG tablet Take 1 tablet (100 mg total) by mouth 2 (two) times daily for 7 days. 14 tablet 0   ondansetron (ZOFRAN ODT) 8 MG disintegrating tablet Dissolve 1 tablet (8 mg total) by mouth every 8 (eight) hours as needed for nausea or vomiting. 30 tablet 2   potassium chloride 20 MEQ/15ML (10%) SOLN Take 15 mLs (20 mEq total) by mouth 2 (two) times daily. 210 mL 0   ALPRAZolam (XANAX) 0.25 MG tablet Take 1 tablet (0.25 mg total) by mouth 3 (three) times daily as needed for anxiety (Patient not taking: Reported on 12/04/2021) 30 tablet 0   HYDROcodone bit-homatropine (HYCODAN) 5-1.5 MG/5ML syrup Take 5 mLs by mouth every 6 (six) hours as needed for cough. 120 mL 0   lidocaine (XYLOCAINE) 2 % solution Use as directed 15 mLs in the mouth or throat as needed for mouth pain. Swallow ~ 20 minutes prior to meals, dilute as per writtten instructions (Patient not taking: Reported on 11/11/2021) 100 mL 2   magnesium oxide (MAG-OX) 400 (240 Mg) MG tablet Take 1 tablet (400 mg total) by mouth 3 (three) times daily. (Patient not taking: Reported on 12/04/2021) 90 tablet 2   oxyCODONE ER (XTAMPZA ER) 18 MG C12A Take 1 capsule by mouth 2 times daily. (Patient not taking: Reported on 11/11/2021) 28 capsule 0   oxyCODONE-acetaminophen (PERCOCET/ROXICET) 5-325 MG tablet Take 1 tablet by mouth  every 8 (eight) hours as needed for severe pain. 30 tablet 0   sucralfate (CARAFATE) 1 g tablet Take 1 tablet (1 g total) by mouth 4 (four) times daily -  with meals and at bedtime. Crush and dissolve in 10 mL of warm water prior to swallowing (Patient not taking: Reported on 12/04/2021) 120 tablet 1   No current facility-administered medications for this visit.    SURGICAL HISTORY:  Past Surgical History:  Procedure Laterality Date   APPENDECTOMY     BRONCHIAL BIOPSY  09/21/2021   Procedure: BRONCHIAL BIOPSIES;  Surgeon: Collene Gobble, MD;  Location: [facility] ENDOSCOPY;  Service: Pulmonary;;   BRONCHIAL BRUSHINGS  09/21/2021   Procedure: BRONCHIAL BRUSHINGS;  Surgeon: Collene Gobble, MD;  Location: [facility] ENDOSCOPY;  Service: Pulmonary;;   BRONCHIAL NEEDLE ASPIRATION BIOPSY  09/21/2021   Procedure: BRONCHIAL NEEDLE ASPIRATION BIOPSIES;  Surgeon: Collene Gobble, MD;  Location: [facility] ENDOSCOPY;  Service: Pulmonary;;   LUMBAR LAMINECTOMY N/A 05/13/2018  Procedure: LEFT LUMBAR FOUR-FIVE MICRODISCECTOMY;  Surgeon: Marybelle Killings, MD;  Location: [facility];  Service: Orthopedics;  Laterality: N/A;   TUBAL LIGATION     TUBAL LIGATION  1998   VIDEO BRONCHOSCOPY WITH ENDOBRONCHIAL ULTRASOUND N/A 09/21/2021   Procedure: VIDEO BRONCHOSCOPY WITH ENDOBRONCHIAL ULTRASOUND;  Surgeon: Collene Gobble, MD;  Location: [facility] ENDOSCOPY;  Service: Pulmonary;  Laterality: N/A;    REVIEW OF SYSTEMS:   Review of Systems  Constitutional: Positive for fatigue, decreased appetite, and weight loss.  Negative for chills and fever.  HENT: Positive for radiation-induced esophagitis.  Negative for mouth sores, nosebleeds, sore throat and trouble swallowing.   Eyes: Negative for eye problems and icterus.  Respiratory: Positive for cough.  Positive for baseline dyspnea on exertion.  Negative for hemoptysis and wheezing.   Cardiovascular: Negative for chest pain and leg swelling.  Gastrointestinal: Positive for nausea and vomiting.   Negative for abdominal pain, constipation, and diarrhea.  Genitourinary: Negative for bladder incontinence, difficulty urinating, dysuria, frequency and hematuria.   Musculoskeletal: Negative for back pain, gait problem, neck pain and neck stiffness.  Skin: Negative for itching and rash.  Neurological: Negative for dizziness, extremity weakness, gait problem, headaches, light-headedness and seizures.  Hematological: Negative for adenopathy. Does not bruise/bleed easily.  Psychiatric/Behavioral: Negative for confusion, depression and sleep disturbance. The patient is not nervous/anxious.     PHYSICAL EXAMINATION:  Blood pressure 100/82, pulse (!) 114, temperature (!) 97.2 F (36.2 C), resp. rate 18, weight 157 lb 6 oz (71.4 kg), last menstrual period 04/24/2018, SpO2 90 %.  ECOG PERFORMANCE STATUS: 1  Physical Exam  Constitutional: Oriented to person, place, and time and well-developed, well-nourished, and in no distress. HENT:  Head: Normocephalic and atraumatic.  Mouth/Throat: Oropharynx is clear and moist. No oropharyngeal exudate.  Eyes: Conjunctivae are normal. Right eye exhibits no discharge. Left eye exhibits no discharge. No scleral icterus.  Neck: Normal range of motion. Neck supple.  Cardiovascular: Tachycardia, regular rhythm, normal heart sounds and intact distal pulses.   Pulmonary/Chest: Effort normal and breath sounds normal. No respiratory distress. No wheezes. No rales.  Abdominal: Soft. Bowel sounds are normal. Exhibits no distension and no mass. There is no tenderness.  Musculoskeletal: Normal range of motion. Exhibits no edema.  Lymphadenopathy:    No cervical adenopathy.  Neurological: Alert and oriented to person, place, and time. Exhibits normal muscle tone. Gait normal. Coordination normal.  Skin: Skin is warm and dry. No rash noted. Not diaphoretic. No erythema. No pallor.  Psychiatric: Mood, memory and judgment normal.  Vitals reviewed.  LABORATORY DATA: Lab  Results  Component Value Date   WBC 6.4 12/04/2021   HGB 11.0 (L) 12/04/2021   HCT 31.7 (L) 12/04/2021   MCV 94.9 12/04/2021   PLT 131 (L) 12/04/2021      Chemistry      Component Value Date/Time   NA 136 12/04/2021 0754   NA 139 02/18/2017 0932   K 2.7 (LL) 12/04/2021 0754   CL 104 12/04/2021 0754   CO2 20 (L) 12/04/2021 0754   BUN 10 12/04/2021 0754   BUN 11 02/18/2017 0932   CREATININE 0.80 12/04/2021 0754      Component Value Date/Time   CALCIUM 9.0 12/04/2021 0754   ALKPHOS 112 12/04/2021 0754   AST 9 (L) 12/04/2021 0754   ALT 7 12/04/2021 0754   BILITOT 0.7 12/04/2021 0754       RADIOGRAPHIC STUDIES:  DG Chest 2 View  Result Date: 11/28/2021 CLINICAL DATA:  Cough, neutropenia and chills. EXAM: CHEST - 2 VIEW COMPARISON:  09/21/2021 FINDINGS: Normal heart size. No signs of pleural effusion or edema. Significant reduction and previously noted perihilar right upper lobe lung mass consistent with known non-small cell lung cancer. No acute airspace consolidation or atelectasis. No acute osseous findings. Previously noted multifocal FDG avid bone metastases are better seen on prior PET-CT from 10/12/2021. IMPRESSION: 1. No acute cardiopulmonary abnormalities. 2. Significant reduction in right upper lobe lung mass consistent with known non-small cell lung cancer. Electronically Signed   By: Kerby Moors M.D.   On: 11/28/2021 16:17     ASSESSMENT/PLAN:  This is a very pleasant 51 year old Caucasian female diagnosed with limited stage (T3, N2, MX) small cell lung cancer.  She presented with a right hilar/suprahilar mass with occlusion of the right upper lobe bronchus in addition to right paratracheal, right infrahilar, and subcarinal lymphadenopathy.  She also has right upper lobe and right apical pulmonary nodules.  She was diagnosed in October 2022.  Her brain MRI did not show any evidence of metastatic disease to the brain.   The patient is currently undergoing treatment  with systemic chemotherapy cisplatin 80 mg per metered squared on days 1, etoposide 100 mg per metered squared on days 1, 2, and 3 IV every 3 weeks.  She  completed her concurrent radiation portion of treatment under the care of Dr. Sondra Come.  She is status post 3 cycles.   The patient was seen with Dr. Julien Nordmann. Labs were reviewed.  The patient's white blood cell count and hemoglobin are significantly improved this week.  We will continue to monitor her labs closely weekly.  If she becomes neutropenic, we will arrange for her to come in and receive Zarxio injections again if needed.  Dr. Julien Nordmann recommends that she proceed with cycle #4 today as scheduled at the same dose.  This may be her last round of chemotherapy.  I will arrange for her to receive a sample to blood bank weekly in case she needs another blood transfusion.   I will arrange for a restaging CT scan of the chest prior to her next appointment in 3 weeks.   She is prescribed Compazine, Phenergan, and Zofran; however, she reportedly has a hard time taking pills.  She has sublingual Zofran.  It was helpful for her last week coming in for IV fluids and IV antiemetics.  Advised her to call us in the interval if she needs additional IV fluids and IV antiemetics since she is not compliant with taking her home antiemetics.  I will send a refill of her Hycodan for her cough.  She states this is effective for her and helps her slight without coughing.   The patient continues to have a significant amount of radiation-induced esophagitis despite having completed radiation a few weeks ago.  She is unable to tolerate viscous lidocaine or Carafate due to the consistency/texture of this.  The patient has been told previously to bring her pain medications to her appointments if needed.  I will send her a one-time refill of Percocet 5-325.  Discussed I would like her to try to only take this if she has significant pain.  Discussed that this contains Tylenol and  so to be cautious to not exceed the daily dose Tylenol.  Discussed with Dr. Julien Nordmann, we will refer her to palliative care for additional recommendations regarding cough, decreased appetite, radiation-induced esophagitis, and nausea and vomiting. I sent her prescription to the incorrect pharmacy. I have  called the [facility] on [address] and cancelled the prescription and have resent the prescription to the [facility].   The patient's potassium is low today 2.7 and her magnesium is low 1.4.  She did not realize that she was supposed to be taking at home potassium and magnesium as she sometimes has received this in the clinic.  We will arrange for her to receive 40 mill equivalents of IV potassium today and 4 g of magnesium.  I also sent her home with a prescription of liquid potassium to take 40 mEq twice daily for 7 days and magnesium 400 milligrams p.o. daily.  The patient was given a work note today to excuse her leave of absence until 12/26/2021.  We discussed the importance of good nutrition.  Discussed alternatives and soft food diet with the patient.  Also discussed supplemental drinks.  Strongly encouraged the patient to quit smoking.  Discussed this will likely help her appetite and cough.   The patient was advised to call immediately if she has any concerning symptoms in the interval. The patient voices understanding of current disease status and treatment options and is in agreement with the current care plan. All questions were answered. The patient knows to call the clinic with any problems, questions or concerns. We can certainly see the patient much sooner if necessary           Orders Placed This Encounter  Procedures   CT Chest W Contrast    Standing Status:   Future    Standing Expiration Date:   12/04/2022    Order Specific Question:   If indicated for the ordered procedure, I authorize the administration of contrast media per Radiology protocol     Answer:   Yes    Order Specific Question:   Is patient pregnant?    Answer:   No    Order Specific Question:   Preferred imaging location?    Answer:   [facility]   Sample to Blood Bank    Standing Status:   Standing    Number of Occurrences:   3    Standing Expiration Date:   12/04/2022      Tobe Sos Johana Hopkinson, PA-C 12/04/21  ADDENDUM: Hematology/Oncology Attending: I had a face-to-face encounter with the patient today.  I reviewed her record, lab and recommended her care plan.  This is a very pleasant 51 years old white female diagnosed with limited stage (T3, N2, M0) small cell lung cancer presented with right hilar/suprahilar mass with occlusion of the right upper lobe bronchus in addition to right paratracheal, right infrahilar and subcarinal lymphadenopathy as well as right upper lobe and right apical pulmonary nodules diagnosed in October 2022. She is currently undergoing a course of systemic chemotherapy with cisplatin and etoposide status post 3 cycles.  This was concurrent with radiotherapy during the first 2 cycles which is completed. She continues to tolerate this treatment well except for the odynophagia issues and she is taking a lot of pain medication at this point with some concern about drug abuse. I recommended for the patient to proceed with cycle #4 today as planned.  I strongly encouraged her to take her medication as prescribed and to wean herself slowly as her odynophagia improves. For the hypokalemia and hypomagnesemia, will arrange for the patient to receive supplemental potassium chloride and magnesium sulfate intravenously in the clinic in addition to oral magnesium oxide and potassium chloride at home. For the nausea, she has prescription  for Compazine Phenergan and Zofran on as-needed basis. The patient will come back for follow-up visit in 3 weeks for evaluation with repeat CT scan of the chest for restaging of her disease. She was advised to call  immediately if she has any concerning symptoms in the interval. The total time spent in the appointment was 30 minutes. Disclaimer: This note was dictated with voice recognition software. Similar sounding words can inadvertently be transcribed and may be missed upon review. Eilleen Kempf, MD 12/04/21

## 2021-12-03 MED FILL — Dexamethasone Sodium Phosphate Inj 100 MG/10ML: INTRAMUSCULAR | Qty: 1 | Status: AC

## 2021-12-03 MED FILL — Fosaprepitant Dimeglumine For IV Infusion 150 MG (Base Eq): INTRAVENOUS | Qty: 5 | Status: AC

## 2021-12-04 ENCOUNTER — Inpatient Hospital Stay: Payer: Medicaid Other

## 2021-12-04 ENCOUNTER — Inpatient Hospital Stay (HOSPITAL_BASED_OUTPATIENT_CLINIC_OR_DEPARTMENT_OTHER): Payer: Medicaid Other | Admitting: Physician Assistant

## 2021-12-04 ENCOUNTER — Other Ambulatory Visit: Payer: Self-pay

## 2021-12-04 ENCOUNTER — Other Ambulatory Visit (HOSPITAL_COMMUNITY): Payer: Self-pay

## 2021-12-04 ENCOUNTER — Encounter: Payer: Self-pay | Admitting: Internal Medicine

## 2021-12-04 VITALS — BP 100/82 | HR 114 | Temp 97.2°F | Resp 18 | Wt 157.4 lb

## 2021-12-04 DIAGNOSIS — C3411 Malignant neoplasm of upper lobe, right bronchus or lung: Secondary | ICD-10-CM

## 2021-12-04 DIAGNOSIS — G893 Neoplasm related pain (acute) (chronic): Secondary | ICD-10-CM | POA: Diagnosis not present

## 2021-12-04 DIAGNOSIS — Z5111 Encounter for antineoplastic chemotherapy: Secondary | ICD-10-CM

## 2021-12-04 DIAGNOSIS — R059 Cough, unspecified: Secondary | ICD-10-CM

## 2021-12-04 LAB — CMP (CANCER CENTER ONLY)
ALT: 7 U/L (ref 0–44)
AST: 9 U/L — ABNORMAL LOW (ref 15–41)
Albumin: 3.5 g/dL (ref 3.5–5.0)
Alkaline Phosphatase: 112 U/L (ref 38–126)
Anion gap: 12 (ref 5–15)
BUN: 10 mg/dL (ref 6–20)
CO2: 20 mmol/L — ABNORMAL LOW (ref 22–32)
Calcium: 9 mg/dL (ref 8.9–10.3)
Chloride: 104 mmol/L (ref 98–111)
Creatinine: 0.8 mg/dL (ref 0.44–1.00)
GFR, Estimated: >60 mL/min (ref >60)
Glucose, Bld: 106 mg/dL — ABNORMAL HIGH (ref 70–99)
Potassium: 2.7 mmol/L — CL (ref 3.5–5.1)
Sodium: 136 mmol/L (ref 135–145)
Total Bilirubin: 0.7 mg/dL (ref 0.3–1.2)
Total Protein: 7.1 g/dL (ref 6.5–8.1)

## 2021-12-04 LAB — CBC WITH DIFFERENTIAL (CANCER CENTER ONLY)
Abs Immature Granulocytes: 0.34 10*3/uL — ABNORMAL HIGH (ref 0.00–0.07)
Basophils Absolute: 0 10*3/uL (ref 0.0–0.1)
Basophils Relative: 1 %
Eosinophils Absolute: 0 10*3/uL (ref 0.0–0.5)
Eosinophils Relative: 0 %
HCT: 31.7 % — ABNORMAL LOW (ref 36.0–46.0)
Hemoglobin: 11 g/dL — ABNORMAL LOW (ref 12.0–15.0)
Immature Granulocytes: 5 %
Lymphocytes Relative: 17 %
Lymphs Abs: 1.1 10*3/uL (ref 0.7–4.0)
MCH: 32.9 pg (ref 26.0–34.0)
MCHC: 34.7 g/dL (ref 30.0–36.0)
MCV: 94.9 fL (ref 80.0–100.0)
Monocytes Absolute: 1 10*3/uL (ref 0.1–1.0)
Monocytes Relative: 16 %
Neutro Abs: 3.9 10*3/uL (ref 1.7–7.7)
Neutrophils Relative %: 61 %
Platelet Count: 131 10*3/uL — ABNORMAL LOW (ref 150–400)
RBC: 3.34 MIL/uL — ABNORMAL LOW (ref 3.87–5.11)
RDW: 16.5 % — ABNORMAL HIGH (ref 11.5–15.5)
WBC Count: 6.4 10*3/uL (ref 4.0–10.5)
nRBC: 0 % (ref 0.0–0.2)

## 2021-12-04 LAB — MAGNESIUM: Magnesium: 1.4 mg/dL — ABNORMAL LOW (ref 1.7–2.4)

## 2021-12-04 MED ORDER — POTASSIUM CHLORIDE IN NACL 40-0.9 MEQ/L-% IV SOLN
Freq: Once | INTRAVENOUS | Status: AC
  Filled 2021-12-04: qty 1000

## 2021-12-04 MED ORDER — SODIUM CHLORIDE 0.9 % IV SOLN: Freq: Once | INTRAVENOUS | Status: AC

## 2021-12-04 MED ORDER — SODIUM CHLORIDE 0.9 % IV SOLN
150.0000 mg | Freq: Once | INTRAVENOUS | Status: AC
  Administered 2021-12-04: 150 mg via INTRAVENOUS
  Filled 2021-12-04: qty 150

## 2021-12-04 MED ORDER — SODIUM CHLORIDE 0.9 % IV SOLN
80.0000 mg/m2 | Freq: Once | INTRAVENOUS | Status: AC
  Administered 2021-12-04: 155 mg via INTRAVENOUS
  Filled 2021-12-04: qty 155

## 2021-12-04 MED ORDER — POTASSIUM CHLORIDE 20 MEQ/15ML (10%) PO SOLN: 20.0000 meq | Freq: Two times a day (BID) | ORAL | 0 refills | Status: DC

## 2021-12-04 MED ORDER — POTASSIUM CHLORIDE 20 MEQ/15ML (10%) PO SOLN
20.0000 meq | Freq: Two times a day (BID) | ORAL | 0 refills | Status: DC
  Filled 2021-12-04: qty 210, 7d supply, fill #0

## 2021-12-04 MED ORDER — PALONOSETRON HCL INJECTION 0.25 MG/5ML
0.2500 mg | Freq: Once | INTRAVENOUS | Status: AC
  Administered 2021-12-04: 0.25 mg via INTRAVENOUS
  Filled 2021-12-04: qty 5

## 2021-12-04 MED ORDER — SODIUM CHLORIDE 0.9 % IV SOLN
10.0000 mg | Freq: Once | INTRAVENOUS | Status: AC
  Administered 2021-12-04: 10 mg via INTRAVENOUS
  Filled 2021-12-04: qty 10

## 2021-12-04 MED ORDER — OXYCODONE-ACETAMINOPHEN 5-325 MG PO TABS
1.0000 | ORAL_TABLET | Freq: Three times a day (TID) | ORAL | 0 refills | Status: DC | PRN
  Filled 2021-12-04: qty 30, 10d supply, fill #0

## 2021-12-04 MED ORDER — MAGNESIUM SULFATE 4 GM/100ML IV SOLN
4.0000 g | Freq: Once | INTRAVENOUS | Status: AC
  Administered 2021-12-04: 4 g via INTRAVENOUS
  Filled 2021-12-04: qty 100

## 2021-12-04 MED ORDER — HYDROCODONE BIT-HOMATROP MBR 5-1.5 MG/5ML PO SOLN
5.0000 mL | Freq: Four times a day (QID) | ORAL | 0 refills | Status: DC | PRN
  Filled 2021-12-04: qty 120, 6d supply, fill #0

## 2021-12-04 MED ORDER — OXYCODONE-ACETAMINOPHEN 5-325 MG PO TABS: 1.0000 | ORAL_TABLET | Freq: Three times a day (TID) | ORAL | 0 refills | Status: DC | PRN

## 2021-12-04 MED ORDER — HYDROCODONE BIT-HOMATROP MBR 5-1.5 MG/5ML PO SOLN: 5.0000 mL | Freq: Four times a day (QID) | ORAL | 0 refills | Status: DC | PRN

## 2021-12-04 MED ORDER — SODIUM CHLORIDE 0.9 % IV SOLN
100.0000 mg/m2 | Freq: Once | INTRAVENOUS | Status: AC
  Administered 2021-12-04: 190 mg via INTRAVENOUS
  Filled 2021-12-04: qty 9.5

## 2021-12-04 MED FILL — Dexamethasone Sodium Phosphate Inj 100 MG/10ML: INTRAMUSCULAR | Qty: 1 | Status: AC

## 2021-12-04 NOTE — Progress Notes (Signed)
Ok to treat with elevated pulse per [place], [place].

## 2021-12-04 NOTE — Patient Instructions (Addendum)
[facility]  Discharge Instructions: Thank you for choosing [facility] to provide your oncology and hematology care.   If you have a lab appointment with the [facility], please go directly to the [facility] and check in at the registration area.   Wear comfortable clothing and clothing appropriate for easy access to any Portacath or PICC line.   We strive to give you quality time with your provider. You may need to reschedule your appointment if you arrive late (15 or more minutes).  Arriving late affects you and other patients whose appointments are after yours.  Also, if you miss three or more appointments without notifying the office, you may be dismissed from the clinic at the providers discretion.      For prescription refill requests, have your pharmacy contact our office and allow 72 hours for refills to be completed.    Today you received the following chemotherapy and/or immunotherapy agent: cisplatin and etoposide.   To help prevent nausea and vomiting after your treatment, we encourage you to take your nausea medication as directed.  BELOW ARE SYMPTOMS THAT SHOULD BE REPORTED IMMEDIATELY: *FEVER GREATER THAN 100.4 F (38 C) OR HIGHER *CHILLS OR SWEATING *NAUSEA AND VOMITING THAT IS NOT CONTROLLED WITH YOUR NAUSEA MEDICATION *UNUSUAL SHORTNESS OF BREATH *UNUSUAL BRUISING OR BLEEDING *URINARY PROBLEMS (pain or burning when urinating, or frequent urination) *BOWEL PROBLEMS (unusual diarrhea, constipation, pain near the anus) TENDERNESS IN MOUTH AND THROAT WITH OR WITHOUT PRESENCE OF ULCERS (sore throat, sores in mouth, or a toothache) UNUSUAL RASH, SWELLING OR PAIN  UNUSUAL VAGINAL DISCHARGE OR ITCHING   Items with * indicate a potential emergency and should be followed up as soon as possible or go to the Emergency Department if any problems should occur.  Please show the CHEMOTHERAPY ALERT CARD or IMMUNOTHERAPY ALERT CARD at  check-in to the Emergency Department and triage nurse.  Should you have questions after your visit or need to cancel or reschedule your appointment, please contact [facility]  Dept: [phone]  and follow the prompts.  Office hours are 8:00 a.m. to 4:30 p.m. Monday - Friday. Please note that voicemails left after 4:00 p.m. may not be returned until the following business day.  We are closed weekends and major holidays. You have access to a nurse at all times for urgent questions. Please call the main number to the clinic Dept: [phone] and follow the prompts.   For any non-urgent questions, you may also contact your provider using MyChart. We now offer e-Visits for anyone 64 and older to request care online for non-urgent symptoms. For details visit [email or web address].   Also download the MyChart app! Go to the app store, search "MyChart", open the app, select [facility], and log in with your MyChart username and password.  Due to Covid, a mask is required upon entering the hospital/clinic. If you do not have a mask, one will be given to you upon arrival. For doctor visits, patients may have 1 support person aged 27 or older with them. For treatment visits, patients cannot have anyone with them due to current Covid guidelines and our immunocompromised population.

## 2021-12-05 ENCOUNTER — Inpatient Hospital Stay: Payer: Medicaid Other

## 2021-12-05 ENCOUNTER — Encounter: Payer: Self-pay | Admitting: *Deleted

## 2021-12-05 VITALS — BP 114/89 | HR 116 | Temp 98.1°F | Resp 18

## 2021-12-05 DIAGNOSIS — Z5111 Encounter for antineoplastic chemotherapy: Secondary | ICD-10-CM | POA: Diagnosis not present

## 2021-12-05 DIAGNOSIS — C3411 Malignant neoplasm of upper lobe, right bronchus or lung: Secondary | ICD-10-CM

## 2021-12-05 MED ORDER — SODIUM CHLORIDE 0.9 % IV SOLN: Freq: Once | INTRAVENOUS | Status: AC

## 2021-12-05 MED ORDER — SODIUM CHLORIDE 0.9 % IV SOLN
10.0000 mg | Freq: Once | INTRAVENOUS | Status: AC
  Administered 2021-12-05: 10 mg via INTRAVENOUS
  Filled 2021-12-05: qty 10

## 2021-12-05 MED ORDER — SODIUM CHLORIDE 0.9 % IV SOLN
100.0000 mg/m2 | Freq: Once | INTRAVENOUS | Status: AC
  Administered 2021-12-05: 190 mg via INTRAVENOUS
  Filled 2021-12-05: qty 9.5

## 2021-12-05 MED FILL — Dexamethasone Sodium Phosphate Inj 100 MG/10ML: INTRAMUSCULAR | Qty: 1 | Status: AC

## 2021-12-05 NOTE — Patient Instructions (Signed)
[facility]  Discharge Instructions: Thank you for choosing [facility] to provide your oncology and hematology care.   If you have a lab appointment with the [facility], please go directly to the [facility] and check in at the registration area.   Wear comfortable clothing and clothing appropriate for easy access to any Portacath or PICC line.   We strive to give you quality time with your provider. You may need to reschedule your appointment if you arrive late (15 or more minutes).  Arriving late affects you and other patients whose appointments are after yours.  Also, if you miss three or more appointments without notifying the office, you may be dismissed from the clinic at the providers discretion.      For prescription refill requests, have your pharmacy contact our office and allow 72 hours for refills to be completed.    Today you received the following chemotherapy and/or immunotherapy agents: Etoposide   To help prevent nausea and vomiting after your treatment, we encourage you to take your nausea medication as directed.  BELOW ARE SYMPTOMS THAT SHOULD BE REPORTED IMMEDIATELY: *FEVER GREATER THAN 100.4 F (38 C) OR HIGHER *CHILLS OR SWEATING *NAUSEA AND VOMITING THAT IS NOT CONTROLLED WITH YOUR NAUSEA MEDICATION *UNUSUAL SHORTNESS OF BREATH *UNUSUAL BRUISING OR BLEEDING *URINARY PROBLEMS (pain or burning when urinating, or frequent urination) *BOWEL PROBLEMS (unusual diarrhea, constipation, pain near the anus) TENDERNESS IN MOUTH AND THROAT WITH OR WITHOUT PRESENCE OF ULCERS (sore throat, sores in mouth, or a toothache) UNUSUAL RASH, SWELLING OR PAIN  UNUSUAL VAGINAL DISCHARGE OR ITCHING   Items with * indicate a potential emergency and should be followed up as soon as possible or go to the Emergency Department if any problems should occur.  Please show the CHEMOTHERAPY ALERT CARD or IMMUNOTHERAPY ALERT CARD at check-in to the  Emergency Department and triage nurse.  Should you have questions after your visit or need to cancel or reschedule your appointment, please contact [facility]  Dept: [phone]  and follow the prompts.  Office hours are 8:00 a.m. to 4:30 p.m. Monday - Friday. Please note that voicemails left after 4:00 p.m. may not be returned until the following business day.  We are closed weekends and major holidays. You have access to a nurse at all times for urgent questions. Please call the main number to the clinic Dept: [phone] and follow the prompts.   For any non-urgent questions, you may also contact your provider using MyChart. We now offer e-Visits for anyone 78 and older to request care online for non-urgent symptoms. For details visit [email or web address].   Also download the MyChart app! Go to the app store, search "MyChart", open the app, select [facility], and log in with your MyChart username and password.  Due to Covid, a mask is required upon entering the hospital/clinic. If you do not have a mask, one will be given to you upon arrival. For doctor visits, patients may have 1 support person aged 38 or older with them. For treatment visits, patients cannot have anyone with them due to current Covid guidelines and our immunocompromised population.

## 2021-12-05 NOTE — Progress Notes (Signed)
OK to trt w/ elevated HR today per [place], PA

## 2021-12-05 NOTE — Progress Notes (Signed)
[facility] CSW Progress Notes  Social Work Theatre manager met with [name] today in infusion to introduce herself to pt after speaking at length on the phone. Pt reports she is doing well and denies any financial or resource needs at this time. Pt says she is very pleased to be completing this round of treatment.  SW intern shared Support Services monthly calendar of offerings and encouraged pt to contact CSW with any future needs or concerns.  Rosary Lively, SW Intern Supervised by Gwinda Maine, LCSW

## 2021-12-06 ENCOUNTER — Other Ambulatory Visit: Payer: Self-pay | Admitting: Physician Assistant

## 2021-12-06 ENCOUNTER — Inpatient Hospital Stay: Payer: Medicaid Other

## 2021-12-06 ENCOUNTER — Other Ambulatory Visit: Payer: Self-pay

## 2021-12-06 VITALS — BP 124/89 | HR 104 | Temp 98.7°F | Resp 18

## 2021-12-06 DIAGNOSIS — R000 Tachycardia, unspecified: Secondary | ICD-10-CM

## 2021-12-06 DIAGNOSIS — C3411 Malignant neoplasm of upper lobe, right bronchus or lung: Secondary | ICD-10-CM

## 2021-12-06 DIAGNOSIS — Z5111 Encounter for antineoplastic chemotherapy: Secondary | ICD-10-CM | POA: Diagnosis not present

## 2021-12-06 MED ORDER — SODIUM CHLORIDE 0.9 % IV SOLN: Freq: Once | INTRAVENOUS | Status: AC

## 2021-12-06 MED ORDER — SODIUM CHLORIDE 0.9 % IV SOLN
100.0000 mg/m2 | Freq: Once | INTRAVENOUS | Status: AC
  Administered 2021-12-06: 190 mg via INTRAVENOUS
  Filled 2021-12-06: qty 9.5

## 2021-12-06 MED ORDER — SODIUM CHLORIDE 0.9 % IV SOLN
10.0000 mg | Freq: Once | INTRAVENOUS | Status: AC
  Administered 2021-12-06: 10 mg via INTRAVENOUS
  Filled 2021-12-06: qty 10

## 2021-12-06 NOTE — Patient Instructions (Signed)
[facility]  Happy Last Treatment Day!  Discharge Instructions: Thank you for choosing [facility] to provide your oncology and hematology care.   If you have a lab appointment with the [facility], please go directly to the [facility] and check in at the registration area.   Wear comfortable clothing and clothing appropriate for easy access to any Portacath or PICC line.   We strive to give you quality time with your provider. You may need to reschedule your appointment if you arrive late (15 or more minutes).  Arriving late affects you and other patients whose appointments are after yours.  Also, if you miss three or more appointments without notifying the office, you may be dismissed from the clinic at the providers discretion.      For prescription refill requests, have your pharmacy contact our office and allow 72 hours for refills to be completed.    Today you received the following chemotherapy and/or immunotherapy agents: Etoposide       To help prevent nausea and vomiting after your treatment, we encourage you to take your nausea medication as directed.  BELOW ARE SYMPTOMS THAT SHOULD BE REPORTED IMMEDIATELY: *FEVER GREATER THAN 100.4 F (38 C) OR HIGHER *CHILLS OR SWEATING *NAUSEA AND VOMITING THAT IS NOT CONTROLLED WITH YOUR NAUSEA MEDICATION *UNUSUAL SHORTNESS OF BREATH *UNUSUAL BRUISING OR BLEEDING *URINARY PROBLEMS (pain or burning when urinating, or frequent urination) *BOWEL PROBLEMS (unusual diarrhea, constipation, pain near the anus) TENDERNESS IN MOUTH AND THROAT WITH OR WITHOUT PRESENCE OF ULCERS (sore throat, sores in mouth, or a toothache) UNUSUAL RASH, SWELLING OR PAIN  UNUSUAL VAGINAL DISCHARGE OR ITCHING   Items with * indicate a potential emergency and should be followed up as soon as possible or go to the Emergency Department if any problems should occur.  Please show the CHEMOTHERAPY ALERT CARD or  IMMUNOTHERAPY ALERT CARD at check-in to the Emergency Department and triage nurse.  Should you have questions after your visit or need to cancel or reschedule your appointment, please contact [facility]  Dept: [phone]  and follow the prompts.  Office hours are 8:00 a.m. to 4:30 p.m. Monday - Friday. Please note that voicemails left after 4:00 p.m. may not be returned until the following business day.  We are closed weekends and major holidays. You have access to a nurse at all times for urgent questions. Please call the main number to the clinic Dept: [phone] and follow the prompts.   For any non-urgent questions, you may also contact your provider using MyChart. We now offer e-Visits for anyone 51 and older to request care online for non-urgent symptoms. For details visit [email or web address].   Also download the MyChart app! Go to the app store, search "MyChart", open the app, select [facility], and log in with your MyChart username and password.  Due to Covid, a mask is required upon entering the hospital/clinic. If you do not have a mask, one will be given to you upon arrival. For doctor visits, patients may have 1 support person aged 51 or older with them. For treatment visits, patients cannot have anyone with them due to current Covid guidelines and our immunocompromised population.

## 2021-12-06 NOTE — Progress Notes (Signed)
Per Cassandra Heilingoetter, PA-C - okay to treat with elevated heart rate.

## 2021-12-10 ENCOUNTER — Other Ambulatory Visit (HOSPITAL_COMMUNITY): Payer: Self-pay

## 2021-12-11 ENCOUNTER — Inpatient Hospital Stay: Payer: Medicaid Other

## 2021-12-11 ENCOUNTER — Inpatient Hospital Stay: Payer: Medicaid Other | Admitting: Nurse Practitioner

## 2021-12-12 ENCOUNTER — Encounter: Payer: Self-pay | Admitting: Radiation Oncology

## 2021-12-14 ENCOUNTER — Inpatient Hospital Stay (HOSPITAL_COMMUNITY)
Admission: EM | Admit: 2021-12-14 | Discharge: 2021-12-27 | DRG: 177 | Disposition: A | Discharge status: Home / Self Care | Payer: Medicaid Other | Attending: Internal Medicine | Admitting: Internal Medicine

## 2021-12-14 ENCOUNTER — Encounter (HOSPITAL_COMMUNITY): Payer: Self-pay | Admitting: Emergency Medicine

## 2021-12-14 ENCOUNTER — Emergency Department (HOSPITAL_COMMUNITY): Payer: Medicaid Other

## 2021-12-14 DIAGNOSIS — J449 Chronic obstructive pulmonary disease, unspecified: Secondary | ICD-10-CM | POA: Diagnosis present

## 2021-12-14 DIAGNOSIS — Z882 Allergy status to sulfonamides status: Secondary | ICD-10-CM | POA: Diagnosis not present

## 2021-12-14 DIAGNOSIS — R739 Hyperglycemia, unspecified: Secondary | ICD-10-CM

## 2021-12-14 DIAGNOSIS — R0603 Acute respiratory distress: Secondary | ICD-10-CM | POA: Diagnosis not present

## 2021-12-14 DIAGNOSIS — D701 Agranulocytosis secondary to cancer chemotherapy: Secondary | ICD-10-CM | POA: Diagnosis not present

## 2021-12-14 DIAGNOSIS — R0781 Pleurodynia: Secondary | ICD-10-CM

## 2021-12-14 DIAGNOSIS — Z9221 Personal history of antineoplastic chemotherapy: Secondary | ICD-10-CM

## 2021-12-14 DIAGNOSIS — Z20822 Contact with and (suspected) exposure to covid-19: Secondary | ICD-10-CM | POA: Diagnosis present

## 2021-12-14 DIAGNOSIS — J9600 Acute respiratory failure, unspecified whether with hypoxia or hypercapnia: Secondary | ICD-10-CM

## 2021-12-14 DIAGNOSIS — K449 Diaphragmatic hernia without obstruction or gangrene: Secondary | ICD-10-CM | POA: Diagnosis present

## 2021-12-14 DIAGNOSIS — N179 Acute kidney failure, unspecified: Secondary | ICD-10-CM | POA: Diagnosis present

## 2021-12-14 DIAGNOSIS — R1314 Dysphagia, pharyngoesophageal phase: Secondary | ICD-10-CM | POA: Diagnosis present

## 2021-12-14 DIAGNOSIS — E871 Hypo-osmolality and hyponatremia: Secondary | ICD-10-CM | POA: Diagnosis present

## 2021-12-14 DIAGNOSIS — Z7189 Other specified counseling: Secondary | ICD-10-CM

## 2021-12-14 DIAGNOSIS — D61810 Antineoplastic chemotherapy induced pancytopenia: Secondary | ICD-10-CM | POA: Diagnosis present

## 2021-12-14 DIAGNOSIS — G893 Neoplasm related pain (acute) (chronic): Secondary | ICD-10-CM | POA: Diagnosis present

## 2021-12-14 DIAGNOSIS — T66XXXA Radiation sickness, unspecified, initial encounter: Secondary | ICD-10-CM | POA: Diagnosis present

## 2021-12-14 DIAGNOSIS — E876 Hypokalemia: Secondary | ICD-10-CM | POA: Insufficient documentation

## 2021-12-14 DIAGNOSIS — I82621 Acute embolism and thrombosis of deep veins of right upper extremity: Secondary | ICD-10-CM | POA: Diagnosis not present

## 2021-12-14 DIAGNOSIS — D649 Anemia, unspecified: Secondary | ICD-10-CM

## 2021-12-14 DIAGNOSIS — G4700 Insomnia, unspecified: Secondary | ICD-10-CM

## 2021-12-14 DIAGNOSIS — R609 Edema, unspecified: Secondary | ICD-10-CM | POA: Diagnosis not present

## 2021-12-14 DIAGNOSIS — R0602 Shortness of breath: Secondary | ICD-10-CM

## 2021-12-14 DIAGNOSIS — R7989 Other specified abnormal findings of blood chemistry: Secondary | ICD-10-CM | POA: Diagnosis not present

## 2021-12-14 DIAGNOSIS — I82629 Acute embolism and thrombosis of deep veins of unspecified upper extremity: Secondary | ICD-10-CM

## 2021-12-14 DIAGNOSIS — K208 Other esophagitis without bleeding: Secondary | ICD-10-CM | POA: Diagnosis present

## 2021-12-14 DIAGNOSIS — D696 Thrombocytopenia, unspecified: Secondary | ICD-10-CM | POA: Diagnosis present

## 2021-12-14 DIAGNOSIS — Z886 Allergy status to analgesic agent status: Secondary | ICD-10-CM

## 2021-12-14 DIAGNOSIS — J9601 Acute respiratory failure with hypoxia: Secondary | ICD-10-CM | POA: Diagnosis present

## 2021-12-14 DIAGNOSIS — Z720 Tobacco use: Secondary | ICD-10-CM | POA: Diagnosis present

## 2021-12-14 DIAGNOSIS — Z79899 Other long term (current) drug therapy: Secondary | ICD-10-CM

## 2021-12-14 DIAGNOSIS — C801 Malignant (primary) neoplasm, unspecified: Secondary | ICD-10-CM | POA: Diagnosis not present

## 2021-12-14 DIAGNOSIS — E878 Other disorders of electrolyte and fluid balance, not elsewhere classified: Secondary | ICD-10-CM

## 2021-12-14 DIAGNOSIS — T82868A Thrombosis of vascular prosthetic devices, implants and grafts, initial encounter: Secondary | ICD-10-CM | POA: Diagnosis not present

## 2021-12-14 DIAGNOSIS — R112 Nausea with vomiting, unspecified: Secondary | ICD-10-CM | POA: Diagnosis not present

## 2021-12-14 DIAGNOSIS — R000 Tachycardia, unspecified: Secondary | ICD-10-CM

## 2021-12-14 DIAGNOSIS — D61818 Other pancytopenia: Secondary | ICD-10-CM | POA: Diagnosis not present

## 2021-12-14 DIAGNOSIS — Z515 Encounter for palliative care: Secondary | ICD-10-CM

## 2021-12-14 DIAGNOSIS — R042 Hemoptysis: Secondary | ICD-10-CM | POA: Diagnosis not present

## 2021-12-14 DIAGNOSIS — T18128A Food in esophagus causing other injury, initial encounter: Secondary | ICD-10-CM | POA: Diagnosis not present

## 2021-12-14 DIAGNOSIS — J189 Pneumonia, unspecified organism: Secondary | ICD-10-CM | POA: Diagnosis not present

## 2021-12-14 DIAGNOSIS — R1310 Dysphagia, unspecified: Secondary | ICD-10-CM

## 2021-12-14 DIAGNOSIS — J690 Pneumonitis due to inhalation of food and vomit: Secondary | ICD-10-CM | POA: Diagnosis present

## 2021-12-14 DIAGNOSIS — C3411 Malignant neoplasm of upper lobe, right bronchus or lung: Secondary | ICD-10-CM | POA: Diagnosis present

## 2021-12-14 DIAGNOSIS — Y712 Prosthetic and other implants, materials and accessory cardiovascular devices associated with adverse incidents: Secondary | ICD-10-CM | POA: Diagnosis not present

## 2021-12-14 DIAGNOSIS — K222 Esophageal obstruction: Secondary | ICD-10-CM | POA: Diagnosis present

## 2021-12-14 DIAGNOSIS — E860 Dehydration: Secondary | ICD-10-CM | POA: Diagnosis present

## 2021-12-14 DIAGNOSIS — R5081 Fever presenting with conditions classified elsewhere: Secondary | ICD-10-CM | POA: Diagnosis not present

## 2021-12-14 DIAGNOSIS — R531 Weakness: Secondary | ICD-10-CM | POA: Diagnosis not present

## 2021-12-14 DIAGNOSIS — R0902 Hypoxemia: Secondary | ICD-10-CM

## 2021-12-14 DIAGNOSIS — E872 Acidosis, unspecified: Secondary | ICD-10-CM | POA: Diagnosis present

## 2021-12-14 DIAGNOSIS — Z8249 Family history of ischemic heart disease and other diseases of the circulatory system: Secondary | ICD-10-CM

## 2021-12-14 DIAGNOSIS — F17210 Nicotine dependence, cigarettes, uncomplicated: Secondary | ICD-10-CM | POA: Diagnosis present

## 2021-12-14 DIAGNOSIS — T451X5A Adverse effect of antineoplastic and immunosuppressive drugs, initial encounter: Secondary | ICD-10-CM | POA: Diagnosis present

## 2021-12-14 DIAGNOSIS — D709 Neutropenia, unspecified: Secondary | ICD-10-CM | POA: Diagnosis not present

## 2021-12-14 DIAGNOSIS — Y842 Radiological procedure and radiotherapy as the cause of abnormal reaction of the patient, or of later complication, without mention of misadventure at the time of the procedure: Secondary | ICD-10-CM | POA: Diagnosis present

## 2021-12-14 DIAGNOSIS — Z452 Encounter for adjustment and management of vascular access device: Secondary | ICD-10-CM

## 2021-12-14 DIAGNOSIS — R079 Chest pain, unspecified: Secondary | ICD-10-CM | POA: Insufficient documentation

## 2021-12-14 LAB — COMPREHENSIVE METABOLIC PANEL
ALT: 13 U/L (ref 0–44)
AST: 17 U/L (ref 15–41)
Albumin: 3.5 g/dL (ref 3.5–5.0)
Alkaline Phosphatase: 76 U/L (ref 38–126)
Anion gap: 15 (ref 5–15)
BUN: 22 mg/dL — ABNORMAL HIGH (ref 6–20)
CO2: 16 mmol/L — ABNORMAL LOW (ref 22–32)
Calcium: 8.3 mg/dL — ABNORMAL LOW (ref 8.9–10.3)
Chloride: 102 mmol/L (ref 98–111)
Creatinine, Ser: 1.64 mg/dL — ABNORMAL HIGH (ref 0.44–1.00)
GFR, Estimated: 38 mL/min — ABNORMAL LOW (ref >60)
Glucose, Bld: 145 mg/dL — ABNORMAL HIGH (ref 70–99)
Potassium: 2.7 mmol/L — CL (ref 3.5–5.1)
Sodium: 133 mmol/L — ABNORMAL LOW (ref 135–145)
Total Bilirubin: 0.9 mg/dL (ref 0.3–1.2)
Total Protein: 7.4 g/dL (ref 6.5–8.1)

## 2021-12-14 LAB — PROTIME-INR
INR: 0.9 (ref 0.8–1.2)
Prothrombin Time: 12.4 seconds (ref 11.4–15.2)

## 2021-12-14 LAB — MAGNESIUM: Magnesium: 1.1 mg/dL — ABNORMAL LOW (ref 1.7–2.4)

## 2021-12-14 LAB — PREPARE RBC (CROSSMATCH)

## 2021-12-14 LAB — LACTIC ACID, PLASMA
Lactic Acid, Venous: 2.9 mmol/L (ref 0.5–1.9)
Lactic Acid, Venous: 2.1 mmol/L (ref 0.5–1.9)

## 2021-12-14 LAB — RESP PANEL BY RT-PCR (FLU A&B, COVID) ARPGX2
Influenza A by PCR: NEGATIVE
Influenza B by PCR: NEGATIVE
SARS Coronavirus 2 by RT PCR: NEGATIVE

## 2021-12-14 LAB — TROPONIN I (HIGH SENSITIVITY)
Troponin I (High Sensitivity): 3 ng/L (ref <18)
Troponin I (High Sensitivity): <2 ng/L (ref <18)

## 2021-12-14 MED ORDER — POTASSIUM CHLORIDE 10 MEQ/100ML IV SOLN
10.0000 meq | INTRAVENOUS | Status: AC
  Administered 2021-12-14 – 2021-12-15 (×6): 10 meq via INTRAVENOUS
  Filled 2021-12-14 (×6): qty 100

## 2021-12-14 MED ORDER — ACETAMINOPHEN 650 MG RE SUPP
650.0000 mg | Freq: Four times a day (QID) | RECTAL | Status: DC | PRN
  Administered 2021-12-17: 650 mg via RECTAL
  Filled 2021-12-14: qty 1

## 2021-12-14 MED ORDER — PROCHLORPERAZINE EDISYLATE 10 MG/2ML IJ SOLN
10.0000 mg | Freq: Once | INTRAMUSCULAR | Status: AC
  Administered 2021-12-14: 10 mg via INTRAVENOUS
  Filled 2021-12-14: qty 2

## 2021-12-14 MED ORDER — MAGNESIUM SULFATE 2 GM/50ML IV SOLN
2.0000 g | Freq: Once | INTRAVENOUS | Status: AC
  Administered 2021-12-14: 2 g via INTRAVENOUS
  Filled 2021-12-14: qty 50

## 2021-12-14 MED ORDER — FENTANYL CITRATE PF 50 MCG/ML IJ SOSY
50.0000 ug | PREFILLED_SYRINGE | Freq: Once | INTRAMUSCULAR | Status: AC
  Administered 2021-12-14: 50 ug via INTRAVENOUS
  Filled 2021-12-14: qty 1

## 2021-12-14 MED ORDER — SODIUM CHLORIDE 0.9 % IV SOLN: INTRAVENOUS | Status: AC

## 2021-12-14 MED ORDER — MAGNESIUM SULFATE 2 GM/50ML IV SOLN
2.0000 g | Freq: Once | INTRAVENOUS | Status: AC
Start: 2021-12-15 — End: 2021-12-15
  Administered 2021-12-14: 2 g via INTRAVENOUS
  Filled 2021-12-14: qty 50

## 2021-12-14 MED ORDER — PROCHLORPERAZINE EDISYLATE 10 MG/2ML IJ SOLN
10.0000 mg | Freq: Four times a day (QID) | INTRAMUSCULAR | Status: DC | PRN
  Administered 2021-12-15 – 2021-12-19 (×7): 10 mg via INTRAVENOUS
  Filled 2021-12-14 (×7): qty 2

## 2021-12-14 MED ORDER — ONDANSETRON HCL 4 MG/2ML IJ SOLN
4.0000 mg | Freq: Once | INTRAMUSCULAR | Status: AC
  Administered 2021-12-14: 4 mg via INTRAVENOUS
  Filled 2021-12-14: qty 2

## 2021-12-14 MED ORDER — DM-GUAIFENESIN ER 30-600 MG PO TB12
1.0000 | ORAL_TABLET | Freq: Two times a day (BID) | ORAL | Status: DC
  Administered 2021-12-15 – 2021-12-20 (×9): 1 via ORAL
  Filled 2021-12-14 (×11): qty 1

## 2021-12-14 MED ORDER — GUAIFENESIN-DM 100-10 MG/5ML PO SYRP
5.0000 mL | ORAL_SOLUTION | ORAL | Status: DC | PRN
  Administered 2021-12-15 – 2021-12-20 (×9): 5 mL via ORAL
  Filled 2021-12-14 (×9): qty 10

## 2021-12-14 MED ORDER — ACETAMINOPHEN 325 MG PO TABS
650.0000 mg | ORAL_TABLET | Freq: Four times a day (QID) | ORAL | Status: DC | PRN
  Administered 2021-12-14 – 2021-12-17 (×6): 650 mg via ORAL
  Filled 2021-12-14 (×8): qty 2

## 2021-12-14 MED ORDER — IOHEXOL 350 MG/ML SOLN
75.0000 mL | Freq: Once | INTRAVENOUS | Status: AC | PRN
  Administered 2021-12-14: 75 mL via INTRAVENOUS

## 2021-12-14 MED ORDER — SODIUM CHLORIDE 0.9 % IV SOLN: 10.0000 mL/h | Freq: Once | INTRAVENOUS | Status: DC

## 2021-12-14 MED ORDER — SODIUM CHLORIDE 0.9 % IV SOLN
10.0000 mL/h | Freq: Once | INTRAVENOUS | Status: AC
  Administered 2021-12-17: 10 mL/h via INTRAVENOUS

## 2021-12-14 MED ORDER — LACTATED RINGERS IV BOLUS
1000.0000 mL | Freq: Once | INTRAVENOUS | Status: AC
  Administered 2021-12-14: 1000 mL via INTRAVENOUS

## 2021-12-14 NOTE — ED Notes (Signed)
Critical Results Reported to C.Karle Starch MD  WBC 0.3 Platelets 6 Neutrophils 0.0

## 2021-12-14 NOTE — Progress Notes (Signed)
The patient is requesting pain medication for a pain level of 9. Messaged [employer].

## 2021-12-14 NOTE — ED Notes (Signed)
ED TO INPATIENT HANDOFF REPORT  ED Nurse Name and Phone #: [phone] Erick Colace, RN  S Name/Age/Gender [name] 51 y.o. female Room/Bed: WA14/WA14  Code Status   Code Status: Prior  Home/SNF/Other Home Patient oriented to: self, place, time, and situation Is this baseline? Yes   Triage Complete: Triage complete  Chief Complaint Nausea and vomiting [R11.2]  Triage Note Patient BIB mother, reports SOB and pain in chest this week, worsening today. Hx lung cancer. Completed chemo last week per mother.   Allergies Allergies  Allergen Reactions   Naproxen Nausea Only   Bactrim [Sulfamethoxazole-Trimethoprim] Nausea Only    Level of Care/Admitting Diagnosis ED Disposition     ED Disposition  Admit   Condition  --   Comment  Hospital Area: [facility] [[id number]]  Level of Care: Med-Surg [16]  May admit patient to [facility] or [facility] if equivalent level of care is available:: Yes  Covid Evaluation: Confirmed COVID Negative  Diagnosis: Nausea and vomiting [744752]  Admitting Physician: Bernadette Hoit [[id number]]  Attending Physician: Bernadette Hoit [[id number]]  Estimated length of stay: past midnight tomorrow  Certification:: I certify this patient will need inpatient services for at least 2 midnights          B Medical/Surgery History Past Medical History:  Diagnosis Date   Ankle sprain    left   Anxiety    treated for panic attacks in the past.   Asthma    COPD (chronic obstructive pulmonary disease) ([facility])    Depression    History of radiation therapy    Right lung- 09/26/21-11/13/21- Dr. Gery Pray   Irregular heart rate    Lung cancer [facility])    Renal disorder    kidney infections   Past Surgical History:  Procedure Laterality Date   APPENDECTOMY     BRONCHIAL BIOPSY  09/21/2021   Procedure: BRONCHIAL BIOPSIES;  Surgeon: Collene Gobble, MD;  [facility] ENDOSCOPY;  Service: Pulmonary;;   BRONCHIAL BRUSHINGS   09/21/2021   Procedure: BRONCHIAL BRUSHINGS;  Surgeon: Collene Gobble, MD;  Location: [facility];  Service: Pulmonary;;   BRONCHIAL NEEDLE ASPIRATION BIOPSY  09/21/2021   Procedure: BRONCHIAL NEEDLE ASPIRATION BIOPSIES;  Surgeon: Collene Gobble, MD;  Location: [facility] ENDOSCOPY;  Service: Pulmonary;;   LUMBAR LAMINECTOMY N/A 05/13/2018   Procedure: LEFT LUMBAR FOUR-FIVE MICRODISCECTOMY;  Surgeon: Marybelle Killings, MD;  Location: [facility];  Service: Orthopedics;  Laterality: N/A;   TUBAL LIGATION     TUBAL LIGATION  1998   VIDEO BRONCHOSCOPY WITH ENDOBRONCHIAL ULTRASOUND N/A 09/21/2021   Procedure: VIDEO BRONCHOSCOPY WITH ENDOBRONCHIAL ULTRASOUND;  Surgeon: Collene Gobble, MD;  Location: [facility] ENDOSCOPY;  Service: Pulmonary;  Laterality: N/A;     A IV Location/Drains/Wounds Patient Lines/Drains/Airways Status     Active Line/Drains/Airways     Name Placement date Placement time Site Days   Peripheral IV 11/13/21 22 G Left;Upper;Posterior;Lateral Forearm 11/13/21  1250  Forearm  31   Peripheral IV 12/14/21 20 G 1" Left Antecubital 12/14/21  1626  Antecubital  less than 1   Peripheral IV 12/14/21 20 G Right Hand 12/14/21  1627  Hand  less than 1   Incision (Closed) 05/13/18 Back Other (Comment) 05/13/18  1543  -- 1311            Intake/Output Last 24 hours  Intake/Output Summary (Last 24 hours) at 12/14/2021 1958 Last data filed at 12/14/2021 1951 Gross per 24 hour  Intake 50 ml  Output --  Net 50 ml    Labs/Imaging Results for orders placed or performed during the hospital encounter of 12/14/21 (from the past 48 hour(s))  Lactic acid, plasma     Status: Abnormal   Collection Time: 12/14/21  4:21 PM  Result Value Ref Range   Lactic Acid, Venous 2.9 (HH) 0.5 - 1.9 mmol/L    Comment: CRITICAL RESULT CALLED TO, READ BACK BY AND VERIFIED WITH: LEONARD,S. RN @1735  ON 12/14/2021 BY COHEN,K Performed at [facility], [place] [address] [place], [place] [address]    Comprehensive metabolic panel     Status: Abnormal   Collection Time: 12/14/21  4:26 PM  Result Value Ref Range   Sodium 133 (L) 135 - 145 mmol/L   Potassium 2.7 (LL) 3.5 - 5.1 mmol/L    Comment: CRITICAL RESULT CALLED TO, READ BACK BY AND VERIFIED WITH: LEONARD,S. RN @1735  ON 12/14/2021 BY COHEN,K    Chloride 102 98 - 111 mmol/L   CO2 16 (L) 22 - 32 mmol/L   Glucose, Bld 145 (H) 70 - 99 mg/dL    Comment: Glucose reference range applies only to samples taken after fasting for at least 8 hours.   BUN 22 (H) 6 - 20 mg/dL   Creatinine, Ser 1.64 (H) 0.44 - 1.00 mg/dL   Calcium 8.3 (L) 8.9 - 10.3 mg/dL   Total Protein 7.4 6.5 - 8.1 g/dL   Albumin 3.5 3.5 - 5.0 g/dL   AST 17 15 - 41 U/L   ALT 13 0 - 44 U/L   Alkaline Phosphatase 76 38 - 126 U/L   Total Bilirubin 0.9 0.3 - 1.2 mg/dL   GFR, Estimated 38 (L) >60 mL/min    Comment: (NOTE) Calculated using the CKD-EPI Creatinine Equation (2021)    Anion gap 15 5 - 15    Comment: Performed at [facility], [place] [address], [place],  [address]  CBC with Differential     Status: Abnormal   Collection Time: 12/14/21  4:26 PM  Result Value Ref Range   WBC 0.3 (LL) 4.0 - 10.5 K/uL    Comment: REPEATED TO VERIFY WHITE COUNT CONFIRMED ON SMEAR THIS CRITICAL RESULT HAS VERIFIED AND BEEN CALLED TO S[place],B BY PATRICIA LUZOLO ON 01 20 2023 AT 1722, AND HAS BEEN READ BACK. CRITICAL RESULTS VERIFIED    RBC 2.04 (L) 3.87 - 5.11 MIL/uL   Hemoglobin 7.0 (L) 12.0 - 15.0 g/dL   HCT 19.5 (L) 36.0 - 46.0 %   MCV 95.6 80.0 - 100.0 fL   MCH 34.3 (H) 26.0 - 34.0 pg   MCHC 35.9 30.0 - 36.0 g/dL   RDW 14.9 11.5 - 15.5 %   Platelets 6 (LL) 150 - 400 K/uL    Comment: SPECIMEN CHECKED FOR CLOTS Immature Platelet Fraction may be clinically indicated, consider ordering this additional test [id number] REPEATED TO VERIFY PLATELET COUNT CONFIRMED BY SMEAR    nRBC 0.0 0.0 - 0.2 %   Neutrophils Relative % 12 %   Neutro Abs 0.0 (LL)  1.7 - 7.7 K/uL    Comment: REPEATED TO VERIFY THIS CRITICAL RESULT HAS VERIFIED AND BEEN CALLED TO S[place],B BY PATRICIA LUZOLO ON 01 20 2023 AT 1722, AND HAS BEEN READ BACK. CRITICAL RESULTS VERIFIED    Lymphocytes Relative 82 %   Lymphs Abs 0.3 (L) 0.7 - 4.0 K/uL   Monocytes Relative 3 %   Monocytes Absolute 0.0 (L) 0.1 - 1.0 K/uL   Eosinophils Relative 3 %  Eosinophils Absolute 0.0 0.0 - 0.5 K/uL   Basophils Relative 0 %   Basophils Absolute 0.0 0.0 - 0.1 K/uL   Immature Granulocytes 0 %   Abs Immature Granulocytes 0.00 0.00 - 0.07 K/uL   Polychromasia PRESENT     Comment: Performed at [facility], [place] [address] [place], [place] [address]  Troponin I (High Sensitivity)     Status: None   Collection Time: 12/14/21  4:26 PM  Result Value Ref Range   Troponin I (High Sensitivity) 3 <18 ng/L    Comment: (NOTE) Elevated high sensitivity troponin I (hsTnI) values and significant  changes across serial measurements may suggest ACS but many other  chronic and acute conditions are known to elevate hsTnI results.  Refer to the Links section for chest pain algorithms and additional  guidance. Performed at [facility], [place] [address] [place], [place] [address]   Protime-INR     Status: None   Collection Time: 12/14/21  4:26 PM  Result Value Ref Range   Prothrombin Time 12.4 11.4 - 15.2 seconds   INR 0.9 0.8 - 1.2    Comment: (NOTE) INR goal varies based on device and disease states. Performed at [facility], [place] [address], [place], [place] [address]   Magnesium     Status: Abnormal   Collection Time: 12/14/21  4:26 PM  Result Value Ref Range   Magnesium 1.1 (L) 1.7 - 2.4 mg/dL    Comment: Performed at [facility], [place] [address] [place],  [address]  Resp Panel by RT-PCR (Flu A&B, Covid) Nasopharyngeal Swab     Status: None   Collection Time: 12/14/21  4:38 PM   Specimen: Nasopharyngeal  Swab; Nasopharyngeal(NP) swabs in vial transport medium  Result Value Ref Range   SARS Coronavirus 2 by RT PCR NEGATIVE NEGATIVE    Comment: (NOTE) SARS-CoV-2 target nucleic acids are NOT DETECTED.  The SARS-CoV-2 RNA is generally detectable in upper respiratory specimens during the acute phase of infection. The lowest concentration of SARS-CoV-2 viral copies this assay can detect is 138 copies/mL. A negative result does not preclude SARS-Cov-2 infection and should not be used as the sole basis for treatment or other patient management decisions. A negative result may occur with  improper specimen collection/handling, submission of specimen other than nasopharyngeal swab, presence of viral mutation(s) within the areas targeted by this assay, and inadequate number of viral copies(<138 copies/mL). A negative result must be combined with clinical observations, patient history, and epidemiological information. The expected result is Negative.  Fact Sheet for Patients:  EntrepreneurPulse.com.au  Fact Sheet for Healthcare Providers:  IncredibleEmployment.be  This test is no t yet approved or cleared by the [place] FDA and  has been authorized for detection and/or diagnosis of SARS-CoV-2 by FDA under an Emergency Use Authorization (EUA). This EUA will remain  in effect (meaning this test can be used) for the duration of the COVID-19 declaration under Section 564(b)(1) of the Act, 21 U.S.C.section 360bbb-3(b)(1), unless the authorization is terminated  or revoked sooner.       Influenza A by PCR NEGATIVE NEGATIVE   Influenza B by PCR NEGATIVE NEGATIVE    Comment: (NOTE) The Xpert Xpress SARS-CoV-2/FLU/RSV plus assay is intended as an aid in the diagnosis of influenza from Nasopharyngeal swab specimens and should not be used as a sole basis for treatment. Nasal washings and aspirates are unacceptable for Xpert Xpress  SARS-CoV-2/FLU/RSV testing.  Fact Sheet for  Patients: EntrepreneurPulse.com.au  Fact Sheet for Healthcare Providers: IncredibleEmployment.be  This test is not yet approved or cleared by the [place] FDA and has been authorized for detection and/or diagnosis of SARS-CoV-2 by FDA under an Emergency Use Authorization (EUA). This EUA will remain in effect (meaning this test can be used) for the duration of the COVID-19 declaration under Section 564(b)(1) of the Act, 21 U.S.C. section 360bbb-3(b)(1), unless the authorization is terminated or revoked.  Performed at [facility], [place] [address], [place], [place] [address]   Lactic acid, plasma     Status: Abnormal   Collection Time: 12/14/21  6:26 PM  Result Value Ref Range   Lactic Acid, Venous 2.1 (HH) 0.5 - 1.9 mmol/L    Comment: CRITICAL RESULT CALLED TO, READ BACK BY AND VERIFIED WITH: Reanna Scoggin, RN @ 6073 ON 71/04/2693 BY Ruffin Frederick, MLT Performed at [facility], [place] [address] [place], [place] [address]   Troponin I (High Sensitivity)     Status: None   Collection Time: 12/14/21  6:26 PM  Result Value Ref Range   Troponin I (High Sensitivity) <2 <18 ng/L    Comment: (NOTE) Elevated high sensitivity troponin I (hsTnI) values and significant  changes across serial measurements may suggest ACS but many other  chronic and acute conditions are known to elevate hsTnI results.  Refer to the "Links" section for chest pain algorithms and additional  guidance. Performed at [facility], [place] [address], [place],  [address]    CT Angio Chest PE W/Cm &/Or Wo Cm  Result Date: 12/14/2021 CLINICAL DATA:  Right chest pain, tachycardia. Lung cancer. Shortness of breath. EXAM: CT ANGIOGRAPHY CHEST WITH CONTRAST TECHNIQUE: Multidetector CT imaging of the chest was performed using the standard protocol during bolus administration of intravenous  contrast. Multiplanar CT image reconstructions and MIPs were obtained to evaluate the vascular anatomy. RADIATION DOSE REDUCTION: This exam was performed according to the departmental dose-optimization program which includes automated exposure control, adjustment of the mA and/or kV according to patient size and/or use of iterative reconstruction technique. CONTRAST:  63mL OMNIPAQUE IOHEXOL 350 MG/ML SOLN COMPARISON:  Chest CT 09/15/2021.  PET CT 10/12/2021. FINDINGS: Cardiovascular: No filling defects in the pulmonary arteries to suggest pulmonary emboli. Heart is normal size. Aorta is normal caliber. Mediastinum/Nodes: Esophagus appears thick walled in the upper to midthoracic region. Previously seen right paratracheal adenopathy in the area much improved. No measurable enlarged mediastinal, hilar or axillary adenopathy. Lungs/Pleura: Previously seen right hilar mass improved, measuring 1.5 cm compared to 2 cm on prior PET CT and 6 cm on prior chest CT. Mild nodular airspace disease in the right upper lobe in the area of dense consolidation previously. Scarring in the right apex. Mild emphysema. Left upper lobe nodule posteriorly on image 40 of series 10 measures 5 mm, stable. No new or enlarging pulmonary nodules. No effusions. Upper Abdomen: Imaging into the upper abdomen demonstrates no acute findings. Musculoskeletal: Probable healing pathologic fractures in the right anterior 7th and 8th ribs are again seen, unchanged. No acute fracture. Review of the MIP images confirms the above findings. IMPRESSION: No evidence of pulmonary embolus. Improving mediastinal and right hilar mass/adenopathy. Thickened upper to midthoracic esophagus. 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 Aortic Atherosclerosis (ICD10-I70.0) and Emphysema (ICD10-J43.9). Electronically Signed   By: Rolm Baptise M.D.   On: 12/14/2021 18:36   DG Chest Port 1  View  Result Date: 12/14/2021 CLINICAL DATA:  Chest pain short of breath EXAM: PORTABLE CHEST 1 VIEW COMPARISON:  11/28/2021, CT 09/15/2021 FINDINGS: No acute airspace disease or effusion. Stable cardiomediastinal silhouette. Similar right suprahilar opacity corresponding to known history of lung cancer. No pneumothorax IMPRESSION: No active disease. Similar small right suprahilar opacity, corresponding to history of lung cancer. Electronically Signed   By: Donavan Foil M.D.   On: 12/14/2021 16:41    Pending Labs Unresulted Labs (From admission, onward)     Start     Ordered   12/14/21 1751  Type and screen Delta  Once,   STAT       Comments: [place]    12/14/21 1751   12/14/21 1743  Prepare platelet pheresis  (Adult Blood Adminstration - Platelets (Pheresed))  Once,   R        12/14/21 1742   12/14/21 1742  Prepare RBC (crossmatch)  (Adult Blood Administration - PRBC)  Once,   R       Question Answer Comment  # of Units 1 unit   Transfusion Indications Symptomatic Anemia   Number of Units to Keep Ahead NO units ahead   If emergent release call blood bank Not emergent release      12/14/21 1742   12/14/21 1626  Urinalysis, Routine w reflex microscopic  Once,   STAT        12/14/21 1625   12/14/21 1626  Culture, blood (routine x 2)  BLOOD CULTURE X 2,   STAT      12/14/21 1625   Signed and Held  HIV Antibody (routine testing w rflx)  (HIV Antibody (Routine testing w reflex) panel)  Once,   R        Signed and Held   Signed and Held  Comprehensive metabolic panel  Tomorrow morning,   R        Signed and Held   Signed and Held  CBC  Tomorrow morning,   R        Signed and Held   Signed and Held  Magnesium  Tomorrow morning,   R        Signed and Held   Signed and Held  Phosphorus  Tomorrow morning,   R        Signed and Held            Vitals/Pain Today's Vitals   12/14/21 1800 12/14/21 1830 12/14/21 1845 12/14/21 1920  BP:  114/80 107/73 102/73 108/76  Pulse: (!) 102 96 (!) 102 99  Resp: 15 19 15 19   Temp:    97.7 F (36.5 C)  TempSrc:    Oral  SpO2: 99% 100% 98% 98%    Isolation Precautions No active isolations  Medications Medications  potassium chloride 10 mEq in 100 mL IVPB (10 mEq Intravenous New Bag/Given 12/14/21 1942)  0.9 %  sodium chloride infusion (has no administration in time range)  lactated ringers bolus 1,000 mL (1,000 mLs Intravenous New Bag/Given 12/14/21 1712)  fentaNYL (SUBLIMAZE) injection 50 mcg (50 mcg Intravenous Given 12/14/21 1708)  ondansetron (ZOFRAN) injection 4 mg (4 mg Intravenous Given 12/14/21 1708)  magnesium sulfate IVPB 2 g 50 mL (0 g Intravenous Stopped 12/14/21 1951)  iohexol (OMNIPAQUE) 350 MG/ML injection 75 mL (75 mLs Intravenous Contrast Given 12/14/21 1808)  fentaNYL (SUBLIMAZE) injection 50 mcg (50 mcg Intravenous Given 12/14/21 1834)  prochlorperazine (COMPAZINE) injection 10 mg (10 mg Intravenous Given 12/14/21 1835)  Mobility walks with person assist     Focused Assessments    R Recommendations: See Admitting Provider Note  Report given to:   Additional Notes:

## 2021-12-14 NOTE — ED Provider Notes (Signed)
[facility] EMERGENC[facility] DEPARTMENT Provider Note    CSN: [id number] Arrival date & time: 12/14/21 1608  History Chief Complaint  Patient presents with   Shortness of Breath    [name] is a 51 y.o. female with history of lung cancer, just finished her chemo treatments last week has had nausea, vomiting, poor PO intake and R sided chest pains for the last 3 days with increasing SOB. She is here with her mother who supplements her history. It is not unusual for her to feel poorly after a chemo treatment but this is worse than usual. She does not have any known mets. She has not had a fever. She has been unable to keep down her medications this week. She has never had a pleural effusion or PE. Mother reports she has had low K and low Mg in the past.   Home Medications Prior to Admission medications   Medication Sig Start Date End Date Taking? Authorizing Provider  albuterol (PROVENTIL HFA;VENTOLIN HFA) 108 (90 Base) MCG/ACT inhaler Inhale 2 puffs into the lungs every 6 (six) hours as needed for wheezing or shortness of breath. 03/13/17   Alfonse Spruce, FNP  ALPRAZolam Duanne Moron) 0.25 MG tablet Take 1 tablet (0.25 mg total) by mouth 3 (three) times daily as needed for anxiety Patient not taking: Reported on 12/04/2021 11/08/21   Gery Pray, MD  diphenhydramine-acetaminophen (TYLENOL PM) 25-500 MG TABS tablet Take 3 tablets by mouth at bedtime as needed (sleep).    [provider]  HYDROcodone bit-homatropine (HYCODAN) 5-1.5 MG/5ML syrup Take 5 mLs by mouth every 6 (six) hours as needed for cough. 12/04/21   Heilingoetter, Cassandra L, PA-C  lidocaine (XYLOCAINE) 2 % solution Use as directed 15 mLs in the mouth or throat as needed for mouth pain. Swallow ~ 20 minutes prior to meals, dilute as per writtten instructions Patient not taking: Reported on 11/11/2021 10/09/21   Gery Pray, MD  magnesium oxide (MAG-OX) 400 (240 Mg) MG tablet Take 1 tablet (400 mg total) by mouth 3  (three) times daily. Patient not taking: Reported on 12/04/2021 10/30/21   Curt Bears, MD  ondansetron (ZOFRAN ODT) 8 MG disintegrating tablet Dissolve 1 tablet (8 mg total) by mouth every 8 (eight) hours as needed for nausea or vomiting. 10/11/21   Heilingoetter, Cassandra L, PA-C  oxyCODONE ER (XTAMPZA ER) 18 MG C12A Take 1 capsule by mouth 2 times daily. Patient not taking: Reported on 11/11/2021 11/06/21   Gery Pray, MD  oxyCODONE-acetaminophen (PERCOCET/ROXICET) 5-325 MG tablet Take 1 tablet by mouth every 8 hours as needed for severe pain. 12/04/21   Heilingoetter, Cassandra L, PA-C  potassium chloride 20 MEQ/15ML (10%) SOLN Take 15 mLs by mouth 2 times daily. 12/04/21   Heilingoetter, Cassandra L, PA-C  sucralfate (CARAFATE) 1 g tablet Take 1 tablet (1 g total) by mouth 4 (four) times daily -  with meals and at bedtime. Crush and dissolve in 10 mL of warm water prior to swallowing Patient not taking: Reported on 12/04/2021 11/06/21   Gery Pray, MD     Allergies    Naproxen and Bactrim [sulfamethoxazole-trimethoprim]   Review of Systems   Review of Systems Please see HPI for pertinent positives and negatives  Physical Exam BP 102/73    Pulse (!) 102    Temp 97.8 F (36.6 C) (Oral)    Resp 15    LMP 04/24/2018    SpO2 98%   Physical Exam Vitals and nursing note reviewed.  Constitutional:      Appearance: Normal appearance.  HENT:     Head: Normocephalic and atraumatic.     Nose: Nose normal.     Mouth/Throat:     Mouth: Mucous membranes are moist.  Eyes:     Extraocular Movements: Extraocular movements intact.     Conjunctiva/sclera: Conjunctivae normal.     Comments: Pale sclera  Cardiovascular:     Rate and Rhythm: Tachycardia present.  Pulmonary:     Effort: Pulmonary effort is normal.     Breath sounds: Normal breath sounds. No wheezing, rhonchi or rales.  Abdominal:     General: Abdomen is flat.     Palpations: Abdomen is soft.     Tenderness: There is  no abdominal tenderness.  Musculoskeletal:        General: No swelling. Normal range of motion.     Cervical back: Neck supple.  Skin:    General: Skin is warm and dry.  Neurological:     General: No focal deficit present.     Mental Status: She is alert.  Psychiatric:        Mood and Affect: Mood normal.    ED Results / Procedures / Treatments   EKG EKG Interpretation  Date/Time:  Friday December 14 2021 16:22:04 EST Ventricular Rate:  137 PR Interval:  186 QRS Duration: 92 QT Interval:  323 QTC Calculation: 488 R Axis:   83 Text Interpretation: Sinus tachycardia Ventricular premature complex Aberrant conduction of SV complex(es) LAE, consider biatrial enlargement Abnormal T, consider ischemia, diffuse leads Minimal ST elevation, lateral leads Baseline wander in lead(s) V5 Since last tracing Rate faster Confirmed by Calvert Cantor [phone]) on 12/14/2021 4:37:32 PM  Procedures .Critical Care Performed by: Truddie Hidden, MD Authorized by: Truddie Hidden, MD   Critical care provider statement:    Critical care time (minutes):  40   Critical care time was exclusive of:  Separately billable procedures and treating other patients   Critical care was necessary to treat or prevent imminent or life-threatening deterioration of the following conditions:  Shock and metabolic crisis   Critical care was time spent personally by me on the following activities:  Development of treatment plan with patient or surrogate, discussions with consultants, evaluation of patient's response to treatment, examination of patient, ordering and review of laboratory studies, ordering and review of radiographic studies, ordering and performing treatments and interventions, pulse oximetry, re-evaluation of patient's condition and review of old charts   Care discussed with: admitting provider    Medications Ordered in the ED Medications  potassium chloride 10 mEq in 100 mL IVPB (10 mEq Intravenous New  Bag/Given 12/14/21 1833)  magnesium sulfate IVPB 2 g 50 mL (2 g Intravenous New Bag/Given 12/14/21 1834)  0.9 %  sodium chloride infusion (has no administration in time range)  lactated ringers bolus 1,000 mL (1,000 mLs Intravenous New Bag/Given 12/14/21 1712)  fentaNYL (SUBLIMAZE) injection 50 mcg (50 mcg Intravenous Given 12/14/21 1708)  ondansetron (ZOFRAN) injection 4 mg (4 mg Intravenous Given 12/14/21 1708)  iohexol (OMNIPAQUE) 350 MG/ML injection 75 mL (75 mLs Intravenous Contrast Given 12/14/21 1808)  fentaNYL (SUBLIMAZE) injection 50 mcg (50 mcg Intravenous Given 12/14/21 1834)  prochlorperazine (COMPAZINE) injection 10 mg (10 mg Intravenous Given 12/14/21 1835)    Initial Impression and Plan  Patient with lung cancer, here with worsening nausea, vomiting and chest pain. Noted to be sinus tachycardia on monitor. Will give IVF, pain/nausea meds for comfort. Check labs and CXR.  May need CTA if no other obvious cause found.   ED Course   Clinical Course as of 12/14/21 1931  Fri Dec 14, 2021  1645 CXR neg for acute process.  [CS]  3361 INR is normal. [CS]  2244 Per RN, lab called with critical low WBC, ANC and PLT. Awaiting final results to see Hgb, but she has had pancytopenia before so suspect this will be low too based on exam.  [CS]  1725 CBC shows pancytopenia.  [CS]  55 Spoke with Dr. Chryl Heck, Oncology, who recommends beginning PLT and PRBC transfusions. She will order filgrastim if indicated after chart review. Her K and Mg are also both low, will begin repletion. HR is improving with IVF. No fever or other signs of infection on CXR. Will send for CTA to rule out PE or occult PNA [CS]  1843 CTA is neg for PE. Will discuss admission with Hospitalist.  [CS]  1926 Spoke with Dr. Josephine Cables, Hospitalist, who will evaluate for admission.  [CS]    Clinical Course User Index [CS] Truddie Hidden, MD     MDM Rules/Calculators/A&P Medical Decision Making Amount and/or Complexity of Data  Reviewed Independent Historian: parent External Data Reviewed: labs and notes. Labs: ordered. Decision-making details documented in ED Course. Radiology: ordered and independent interpretation performed. Decision-making details documented in ED Course. ECG/medicine tests: ordered and independent interpretation performed. Decision-making details documented in ED Course.  Risk Prescription drug management. Parenteral controlled substances. Decision regarding hospitalization.    Final Clinical Impression(s) / ED Diagnoses Final diagnoses:  Pancytopenia ([place])  Hypokalemia  Hypomagnesemia  Chemotherapy induced nausea and vomiting    Rx / DC Orders ED Discharge Orders     None        Truddie Hidden, MD 12/14/21 1931

## 2021-12-14 NOTE — H&P (Signed)
History and Physical  [name] ZOX:[id number] DOB: 03-28-1971 DOA: 12/14/2021  Referring physician: Truddie Hidden, MD PCP: Sheila Reichmann, PA-C  Patient coming from: Home  Chief Complaint: Nausea and vomiting  HPI: [name] is a 51 y.o. female with medical history significant for small cell lung cancer, COPD, tobacco abuse who presents to the emergency department accompanied by mother due to 1 day onset of nausea and vomiting and 3-day onset of increasing shortness of breath, she also complained of right-sided chest pain which worsened with coughing.  Last chemotherapy was last week, she states that she usually feel sick after that chemotherapy, however, current symptoms are worse than normal.  She states that she has not been able to keep her medications down due to the vomiting and mother at bedside endorsed that patient has had low magnesium and potassium in the past.  She denies fever, chills, headache, diarrhea or constipation  ED Course:  In the emergency department, she was mildly tachycardic, otherwise, other vital signs were within normal range.  Work-up in the ED pancytopenia, hyponatremia, hypokalemia, hyperglycemia, lactic acidosis.  BUN/creatinine 22/1.64 (baseline creatinine at 0.8-0.9).  Troponin x2 was, magnesium 1.1.  Influenza A, B, SARS coronavirus 2 was negative. Chest x-ray showed no active disease. Similar small right suprahilar opacity, corresponding to history of lung cancer. CT angiogram of the chest with contrast showed no evidence of pulmonary embolus.  Improving mediastinal and right hilar mass/adenopathy. She was treated with IV fentanyl, IV hydration was provided, magnesium was replenished, Zofran was given and Compazine was given.  Oncology was consulted and recommended blood transfusion and platelet transfusion and we will plan to follow-up with patient in the morning per ED physician.  Hospitalist was asked to admit patient for further evaluation  and management  Review of Systems: A full 10 point Review of Systems was done, except as stated above, all other Review of systems were negative.   Past Medical History:  Diagnosis Date   Ankle sprain    left   Anxiety    treated for panic attacks in the past.   Asthma    COPD (chronic obstructive pulmonary disease) (HCC)    Depression    History of radiation therapy    Right lung- 09/26/21-11/13/21- Dr. Gery Pray   Irregular heart rate    Lung cancer [facility])    Renal disorder    kidney infections   Past Surgical History:  Procedure Laterality Date   APPENDECTOMY     BRONCHIAL BIOPSY  09/21/2021   Procedure: BRONCHIAL BIOPSIES;  Surgeon: Collene Gobble, MD;  Location: [facility];  Service: Pulmonary;;   BRONCHIAL BRUSHINGS  09/21/2021   Procedure: BRONCHIAL BRUSHINGS;  Surgeon: Collene Gobble, MD;  Location: [facility];  Service: Pulmonary;;   BRONCHIAL NEEDLE ASPIRATION BIOPSY  09/21/2021   Procedure: BRONCHIAL NEEDLE ASPIRATION BIOPSIES;  Surgeon: Collene Gobble, MD;  Location: [facility];  Service: Pulmonary;;   LUMBAR LAMINECTOMY N/A 05/13/2018   Procedure: LEFT LUMBAR FOUR-FIVE MICRODISCECTOMY;  Surgeon: Marybelle Killings, MD;  Location: [facility];  Service: Orthopedics;  Laterality: N/A;   TUBAL LIGATION     TUBAL LIGATION  1998   VIDEO BRONCHOSCOPY WITH ENDOBRONCHIAL ULTRASOUND N/A 09/21/2021   Procedure: VIDEO BRONCHOSCOPY WITH ENDOBRONCHIAL ULTRASOUND;  Surgeon: Collene Gobble, MD;  Location: [facility];  Service: Pulmonary;  Laterality: N/A;    Social History:  reports that she has been smoking cigarettes. She has a 15.00 pack-year smoking history. She has  never used smokeless tobacco. She reports current alcohol use. She reports that she does not currently use drugs after having used the following drugs: Marijuana.   Allergies  Allergen Reactions   Naproxen Nausea Only   Bactrim [Sulfamethoxazole-Trimethoprim] Nausea Only    Family History  Problem  Relation Age of Onset   Hypertension Mother    Hypertension Father    Heart disease Father      Prior to Admission medications   Medication Sig Start Date End Date Taking? Authorizing Provider  albuterol (PROVENTIL HFA;VENTOLIN HFA) 108 (90 Base) MCG/ACT inhaler Inhale 2 puffs into the lungs every 6 (six) hours as needed for wheezing or shortness of breath. 03/13/17   Alfonse Spruce, FNP  ALPRAZolam Duanne Moron) 0.25 MG tablet Take 1 tablet (0.25 mg total) by mouth 3 (three) times daily as needed for anxiety Patient not taking: Reported on 12/04/2021 11/08/21   Gery Pray, MD  diphenhydramine-acetaminophen (TYLENOL PM) 25-500 MG TABS tablet Take 3 tablets by mouth at bedtime as needed (sleep).    [provider]  HYDROcodone bit-homatropine (HYCODAN) 5-1.5 MG/5ML syrup Take 5 mLs by mouth every 6 (six) hours as needed for cough. 12/04/21   Heilingoetter, Cassandra L, PA-C  lidocaine (XYLOCAINE) 2 % solution Use as directed 15 mLs in the mouth or throat as needed for mouth pain. Swallow ~ 20 minutes prior to meals, dilute as per writtten instructions Patient not taking: Reported on 11/11/2021 10/09/21   Gery Pray, MD  magnesium oxide (MAG-OX) 400 (240 Mg) MG tablet Take 1 tablet (400 mg total) by mouth 3 (three) times daily. Patient not taking: Reported on 12/04/2021 10/30/21   Curt Bears, MD  ondansetron (ZOFRAN ODT) 8 MG disintegrating tablet Dissolve 1 tablet (8 mg total) by mouth every 8 (eight) hours as needed for nausea or vomiting. 10/11/21   Heilingoetter, Cassandra L, PA-C  oxyCODONE ER (XTAMPZA ER) 18 MG C12A Take 1 capsule by mouth 2 times daily. Patient not taking: Reported on 11/11/2021 11/06/21   Gery Pray, MD  oxyCODONE-acetaminophen (PERCOCET/ROXICET) 5-325 MG tablet Take 1 tablet by mouth every 8 hours as needed for severe pain. 12/04/21   Heilingoetter, Cassandra L, PA-C  potassium chloride 20 MEQ/15ML (10%) SOLN Take 15 mLs by mouth 2 times daily. 12/04/21    Heilingoetter, Cassandra L, PA-C  sucralfate (CARAFATE) 1 g tablet Take 1 tablet (1 g total) by mouth 4 (four) times daily -  with meals and at bedtime. Crush and dissolve in 10 mL of warm water prior to swallowing Patient not taking: Reported on 12/04/2021 11/06/21   Gery Pray, MD    Physical Exam: BP 107/72 (BP Location: Right Arm)    Pulse (!) 104    Temp 99.6 F (37.6 C) (Oral)    Resp 15    LMP 04/24/2018    SpO2 100%   General: 51 y.o. year-old female well developed well nourished in no acute distress.  Alert and oriented x3. HEENT: Dry mucous membrane.  NCAT, EOMI, pale conjunctiva Neck: Supple, trachea medial Cardiovascular: Tachycardia.  Regular rate and rhythm with no rubs or gallops.  No thyromegaly or JVD noted.  No lower extremity edema. 2/4 pulses in all 4 extremities. Respiratory: Clear to auscultation with no wheezes or rales. Good inspiratory effort. Abdomen: Soft, nontender nondistended with normal bowel sounds x4 quadrants. Muskuloskeletal: No cyanosis, clubbing or edema noted bilaterally Neuro: CN II-XII intact, sensation, reflexes intact Skin: No ulcerative lesions noted or rashes Psychiatry: Judgement and insight appear normal. Mood  is appropriate for condition and setting          Labs on Admission:  Basic Metabolic Panel: Recent Labs  Lab 12/14/21 1626  NA 133*  K 2.7*  CL 102  CO2 16*  GLUCOSE 145*  BUN 22*  CREATININE 1.64*  CALCIUM 8.3*  MG 1.1*   Liver Function Tests: Recent Labs  Lab 12/14/21 1626  AST 17  ALT 13  ALKPHOS 76  BILITOT 0.9  PROT 7.4  ALBUMIN 3.5   No results for input(s): LIPASE, AMYLASE in the last 168 hours. No results for input(s): AMMONIA in the last 168 hours. CBC: Recent Labs  Lab 12/14/21 1626  WBC 0.3*  NEUTROABS 0.0*  HGB 7.0*  HCT 19.5*  MCV 95.6  PLT 6*   Cardiac Enzymes: No results for input(s): CKTOTAL, CKMB, CKMBINDEX, TROPONINI in the last 168 hours.  BNP (last 3 results) No results for  input(s): BNP in the last 8760 hours.  ProBNP (last 3 results) No results for input(s): PROBNP in the last 8760 hours.  CBG: No results for input(s): GLUCAP in the last 168 hours.  Radiological Exams on Admission: CT Angio Chest PE W/Cm &/Or Wo Cm  Result Date: 12/14/2021 CLINICAL DATA:  Right chest pain, tachycardia. Lung cancer. Shortness of breath. EXAM: CT ANGIOGRAPHY CHEST WITH CONTRAST TECHNIQUE: Multidetector CT imaging of the chest was performed using the standard protocol during bolus administration of intravenous contrast. Multiplanar CT image reconstructions and MIPs were obtained to evaluate the vascular anatomy. RADIATION DOSE REDUCTION: This exam was performed according to the departmental dose-optimization program which includes automated exposure control, adjustment of the mA and/or kV according to patient size and/or use of iterative reconstruction technique. CONTRAST:  50mL OMNIPAQUE IOHEXOL 350 MG/ML SOLN COMPARISON:  Chest CT 09/15/2021.  PET CT 10/12/2021. FINDINGS: Cardiovascular: No filling defects in the pulmonary arteries to suggest pulmonary emboli. Heart is normal size. Aorta is normal caliber. Mediastinum/Nodes: Esophagus appears thick walled in the upper to midthoracic region. Previously seen right paratracheal adenopathy in the area much improved. No measurable enlarged mediastinal, hilar or axillary adenopathy. Lungs/Pleura: Previously seen right hilar mass improved, measuring 1.5 cm compared to 2 cm on prior PET CT and 6 cm on prior chest CT. Mild nodular airspace disease in the right upper lobe in the area of dense consolidation previously. Scarring in the right apex. Mild emphysema. Left upper lobe nodule posteriorly on image 40 of series 10 measures 5 mm, stable. No new or enlarging pulmonary nodules. No effusions. Upper Abdomen: Imaging into the upper abdomen demonstrates no acute findings. Musculoskeletal: Probable healing pathologic fractures in the right anterior 7th  and 8th ribs are again seen, unchanged. No acute fracture. Review of the MIP images confirms the above findings. IMPRESSION: No evidence of pulmonary embolus. Improving mediastinal and right hilar mass/adenopathy. Thickened upper to midthoracic esophagus. 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 Aortic Atherosclerosis (ICD10-I70.0) and Emphysema (ICD10-J43.9). Electronically Signed   By: Rolm Baptise M.D.   On: 12/14/2021 18:36   DG Chest Port 1 View  Result Date: 12/14/2021 CLINICAL DATA:  Chest pain short of breath EXAM: PORTABLE CHEST 1 VIEW COMPARISON:  11/28/2021, CT 09/15/2021 FINDINGS: No acute airspace disease or effusion. Stable cardiomediastinal silhouette. Similar right suprahilar opacity corresponding to known history of lung cancer. No pneumothorax IMPRESSION: No active disease. Similar small right suprahilar opacity, corresponding to history of lung cancer. Electronically Signed   By:  Donavan Foil M.D.   On: 12/14/2021 16:41    EKG: I independently viewed the EKG done and my findings are as followed: Sinus tachycardia at a rate of 137 bpm VPCs, QTc (488 ms)  Assessment/Plan Present on Admission:  Nausea and vomiting  Dehydration  Principal Problem:   Nausea and vomiting Active Problems:   Dehydration   Chest pain   Lactic acidosis   Hypomagnesemia   Hypokalemia   Hyponatremia   Hyperglycemia   Leukopenia due to antineoplastic chemotherapy (HCC)   Thrombocytopenia (HCC)   Normocytic anemia   AKI (acute kidney injury) ([place])   Generalized weakness  Generalized weakness in the setting of nausea and vomiting Continue IV Compazine p.r.n. Continue IV hydration Patient will be started on clear liquid diet in the morning with plan to advanced as tolerated  Pleuritic chest pain related to cough Troponin x2 was negative Continue Robitussin, Mucinex  Lactic acidosis possibly secondary to  multifactorial Lactic acid 2.9 > 2.1; continue IV hydration Continue to trend lactic acid  Hypokalemia K+ is 2.7 K+ will be replenished Please monitor for AM K+ for further replenishmemnt  Hyponatremia  Na 133, IV hydration was provided  Hyperglycemia possibly reactive CBG 145, patient has no history of T2DM Continue to monitor blood glucose level with morning labs  Leukopenia/Thrombocytopenia She was afebrile This may be due to to chemotherapeutic effect Oncology was consulted per ED physician Platelets will be transfused  Normocytic anemia H/H 7.1/19.5, this was 11.0/31.7 on 12/07/2021 1 unit of PRBC will be transfused Continue to monitor hemoglobin and hematocrit level with morning labs  Acute kidney injury/dehydration BUN/creatinine 22/1.64 (baseline creatinine at 0.8-0.9) Continue gentle hydration Renally adjust medications, avoid nephrotoxic agents/dehydration/hypotension  Small cell lung cancer Patient follows-up with oncology (Dr. 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 CT angiography chest was suggestive of radiation esophagitis Continue Tylenol as needed  COPD Continue albuterol  Tobacco use Patient still uses tobacco, though she has decreased rate of use Patient counseled on tobacco abuse cessation Continue nicotine patch   DVT prophylaxis: SCD  Code Status: Full code  Family Communication: None at bedside  Disposition Plan:  Patient is from:                        home Anticipated DC to:                   SNF or family members home Anticipated DC date:               2-3 days Anticipated DC barriers:          Patient requires inpatient management due to severity of symptoms  Consults called: Oncology  Admission status: Inpatient    Bernadette Hoit MD [facility]  12/14/2021, 11:37 PM

## 2021-12-14 NOTE — ED Triage Notes (Signed)
Patient BIB mother, reports SOB and pain in chest this week, worsening today. Hx lung cancer. Completed chemo last week per mother.

## 2021-12-15 ENCOUNTER — Other Ambulatory Visit: Payer: Self-pay

## 2021-12-15 ENCOUNTER — Encounter (HOSPITAL_COMMUNITY): Payer: Self-pay | Admitting: Internal Medicine

## 2021-12-15 DIAGNOSIS — R112 Nausea with vomiting, unspecified: Secondary | ICD-10-CM | POA: Diagnosis not present

## 2021-12-15 DIAGNOSIS — N179 Acute kidney failure, unspecified: Secondary | ICD-10-CM | POA: Diagnosis not present

## 2021-12-15 DIAGNOSIS — R0781 Pleurodynia: Secondary | ICD-10-CM | POA: Diagnosis not present

## 2021-12-15 DIAGNOSIS — E860 Dehydration: Secondary | ICD-10-CM | POA: Diagnosis not present

## 2021-12-15 DIAGNOSIS — D61818 Other pancytopenia: Secondary | ICD-10-CM | POA: Diagnosis not present

## 2021-12-15 LAB — URINALYSIS, ROUTINE W REFLEX MICROSCOPIC
Bacteria, UA: NONE SEEN
Bilirubin Urine: NEGATIVE
Glucose, UA: NEGATIVE mg/dL
Hgb urine dipstick: NEGATIVE
Ketones, ur: NEGATIVE mg/dL
Leukocytes,Ua: NEGATIVE
Nitrite: POSITIVE — AB
Protein, ur: NEGATIVE mg/dL
Specific Gravity, Urine: 1.033 — ABNORMAL HIGH (ref 1.005–1.030)
pH: 5 (ref 5.0–8.0)

## 2021-12-15 LAB — CBC WITH DIFFERENTIAL/PLATELET
Abs Immature Granulocytes: 0 10*3/uL (ref 0.00–0.07)
Basophils Absolute: 0 10*3/uL (ref 0.0–0.1)
Basophils Relative: 0 %
Eosinophils Absolute: 0 10*3/uL (ref 0.0–0.5)
Eosinophils Relative: 3 %
HCT: 19.5 % — ABNORMAL LOW (ref 36.0–46.0)
Hemoglobin: 7 g/dL — ABNORMAL LOW (ref 12.0–15.0)
Immature Granulocytes: 0 %
Lymphocytes Relative: 82 %
Lymphs Abs: 0.3 10*3/uL — ABNORMAL LOW (ref 0.7–4.0)
MCH: 34.3 pg — ABNORMAL HIGH (ref 26.0–34.0)
MCHC: 35.9 g/dL (ref 30.0–36.0)
MCV: 95.6 fL (ref 80.0–100.0)
Monocytes Absolute: 0 10*3/uL — ABNORMAL LOW (ref 0.1–1.0)
Monocytes Relative: 3 %
Neutro Abs: 0 10*3/uL — CL (ref 1.7–7.7)
Neutrophils Relative %: 12 %
Platelets: 6 10*3/uL — CL (ref 150–400)
RBC: 2.04 MIL/uL — ABNORMAL LOW (ref 3.87–5.11)
RDW: 14.9 % (ref 11.5–15.5)
WBC: 0.3 10*3/uL — CL (ref 4.0–10.5)
nRBC: 0 % (ref 0.0–0.2)

## 2021-12-15 LAB — COMPREHENSIVE METABOLIC PANEL
ALT: 11 U/L (ref 0–44)
AST: 14 U/L — ABNORMAL LOW (ref 15–41)
Albumin: 2.9 g/dL — ABNORMAL LOW (ref 3.5–5.0)
Alkaline Phosphatase: 62 U/L (ref 38–126)
Anion gap: 9 (ref 5–15)
BUN: 16 mg/dL (ref 6–20)
CO2: 19 mmol/L — ABNORMAL LOW (ref 22–32)
Calcium: 8 mg/dL — ABNORMAL LOW (ref 8.9–10.3)
Chloride: 104 mmol/L (ref 98–111)
Creatinine, Ser: 1.23 mg/dL — ABNORMAL HIGH (ref 0.44–1.00)
GFR, Estimated: 54 mL/min — ABNORMAL LOW (ref >60)
Glucose, Bld: 116 mg/dL — ABNORMAL HIGH (ref 70–99)
Potassium: 2.9 mmol/L — ABNORMAL LOW (ref 3.5–5.1)
Sodium: 132 mmol/L — ABNORMAL LOW (ref 135–145)
Total Bilirubin: 0.9 mg/dL (ref 0.3–1.2)
Total Protein: 6 g/dL — ABNORMAL LOW (ref 6.5–8.1)

## 2021-12-15 LAB — LACTIC ACID, PLASMA
Lactic Acid, Venous: 0.9 mmol/L (ref 0.5–1.9)
Lactic Acid, Venous: 1.6 mmol/L (ref 0.5–1.9)

## 2021-12-15 LAB — CBC
HCT: 14.1 % — ABNORMAL LOW (ref 36.0–46.0)
Hemoglobin: 4.8 g/dL — CL (ref 12.0–15.0)
MCH: 33.6 pg (ref 26.0–34.0)
MCHC: 34 g/dL (ref 30.0–36.0)
MCV: 98.6 fL (ref 80.0–100.0)
Platelets: 31 10*3/uL — ABNORMAL LOW (ref 150–400)
RBC: 1.43 MIL/uL — ABNORMAL LOW (ref 3.87–5.11)
RDW: 15.3 % (ref 11.5–15.5)
WBC: 0.2 10*3/uL — CL (ref 4.0–10.5)
nRBC: 0 % (ref 0.0–0.2)

## 2021-12-15 LAB — PHOSPHORUS: Phosphorus: 2 mg/dL — ABNORMAL LOW (ref 2.5–4.6)

## 2021-12-15 LAB — HIV ANTIBODY (ROUTINE TESTING W REFLEX): HIV Screen 4th Generation wRfx: NONREACTIVE

## 2021-12-15 LAB — MAGNESIUM: Magnesium: 1.7 mg/dL (ref 1.7–2.4)

## 2021-12-15 LAB — HEMOGLOBIN AND HEMATOCRIT, BLOOD
HCT: 29.1 % — ABNORMAL LOW (ref 36.0–46.0)
Hemoglobin: 10.4 g/dL — ABNORMAL LOW (ref 12.0–15.0)

## 2021-12-15 LAB — PREPARE RBC (CROSSMATCH)

## 2021-12-15 MED ORDER — HYDROMORPHONE HCL 1 MG/ML IJ SOLN
0.5000 mg | Freq: Once | INTRAMUSCULAR | Status: AC
  Administered 2021-12-15: 0.5 mg via INTRAVENOUS
  Filled 2021-12-15: qty 0.5

## 2021-12-15 MED ORDER — TBO-FILGRASTIM 300 MCG/0.5ML ~~LOC~~ SOSY
300.0000 ug | PREFILLED_SYRINGE | Freq: Every day | SUBCUTANEOUS | Status: AC
  Administered 2021-12-15 – 2021-12-19 (×5): 300 ug via SUBCUTANEOUS
  Filled 2021-12-15 (×5): qty 0.5

## 2021-12-15 MED ORDER — HYDROMORPHONE HCL 1 MG/ML IJ SOLN: 0.5000 mg | INTRAMUSCULAR | Status: DC | PRN

## 2021-12-15 MED ORDER — SODIUM CHLORIDE 0.9% IV SOLUTION: Freq: Once | INTRAVENOUS | Status: DC

## 2021-12-15 MED ORDER — SODIUM CHLORIDE 0.9% IV SOLUTION: Freq: Once | INTRAVENOUS | Status: AC

## 2021-12-15 MED ORDER — OXYCODONE HCL 5 MG PO TABS
5.0000 mg | ORAL_TABLET | ORAL | Status: DC | PRN
  Administered 2021-12-15 – 2021-12-25 (×14): 5 mg via ORAL
  Filled 2021-12-15 (×15): qty 1

## 2021-12-15 MED ORDER — OXYCODONE HCL ER 20 MG PO T12A
20.0000 mg | EXTENDED_RELEASE_TABLET | Freq: Two times a day (BID) | ORAL | Status: DC
  Administered 2021-12-15 – 2021-12-27 (×18): 20 mg via ORAL
  Filled 2021-12-15 (×20): qty 1

## 2021-12-15 MED ORDER — POTASSIUM CHLORIDE CRYS ER 20 MEQ PO TBCR
40.0000 meq | EXTENDED_RELEASE_TABLET | ORAL | Status: AC
  Administered 2021-12-15 (×3): 40 meq via ORAL
  Filled 2021-12-15 (×3): qty 2

## 2021-12-15 MED ORDER — NICOTINE 21 MG/24HR TD PT24
21.0000 mg | MEDICATED_PATCH | Freq: Every day | TRANSDERMAL | Status: DC
  Administered 2021-12-15 – 2021-12-27 (×13): 21 mg via TRANSDERMAL
  Filled 2021-12-15 (×13): qty 1

## 2021-12-15 NOTE — Progress Notes (Signed)
PROGRESS NOTE    [name]  ZOX:[id number] DOB: [birth year] DOA: 12/14/2021 PCP: [name], PA-C    Brief Narrative:  [name] is a 51 year old female with past medical history significant for small cell lung cancer with right hilar/suprahilar mass with occlusion of the right upper lobe bronchus right paratracheal/infrahilar/subcarinal lymphadenopathy diagnosed October 2022 currently on chemotherapy and radiation, COPD, tobacco use disorder who presented to [facility] ED on 1/20 with nausea/vomiting, shortness of breath, right-sided chest pain.  Currently on chemotherapy, last infusion on 12/06/2021.  Her nausea and vomiting has progressed and she has been unable to keep her medications down.  Patient denied fever, no chills, no headache, no diarrhea..  In the ED, temperature 98.4 F, HR 145, RR 18, BP 104/30, SPO2 100% on room air.  Sodium 133, potassium 3.7, chloride 102, CO2 16, glucose 145, BUN 22, creatinine 1.64, AST 17, ALT 13, total bilirubin 0.9.  WBC 0.3, hemoglobin 7.0, platelets 6.  High sensitivity troponin 3, <2.  Lactic acid 2.9.  Chest x-ray with no active cardiopulmonary disease process, similar small right suprahilar opacity.  CT angiogram chest with no evidence of pulmonary embolism, improving mediastinal/right hilar mass/adenopathy.  Oncology was consulted.  TRH consulted for further evaluation and management of intractable nausea/vomiting, pancytopenia.   Assessment & Plan:   Principal Problem:   Nausea and vomiting Active Problems:   Dehydration   Chest pain   Lactic acidosis   Hypomagnesemia   Hypokalemia   Hyponatremia   Hyperglycemia   Leukopenia due to antineoplastic chemotherapy (HCC)   Thrombocytopenia (HCC)   Normocytic anemia   AKI (acute kidney injury) ([place])   Generalized weakness   Intractable nausea/vomiting Lactic acidosis: Resolved Patient presenting to ED with progressive nausea and vomiting, and inability to tolerate oral intake or  her home medications.  Elevated lactic acid of 2.9 admission, likely secondary to dehydration. --Clear liquid diet, will advance as tolerates --NS at100 mL/h --Compazine 10 mg IV q6h PRN  Pancytopenia High presentation, WBC count 0.3, hemoglobin 7.0, platelets 6.  Transfused 1 unit platelets with improvement to 31.  Hemoglobin now down to 4.8.  Etiology likely secondary to active chemotherapy. --Oncology consult: Pending --Transfusing 2 unit PRBC; repeat hemoglobin 2 hours following transfusion  Hypokalemia Hypomagnesemia Potassium 2.9 this morning, magnesium 1.7; will continue repletion. --Follow electrolytes daily  Hyponatremia Sodium 133 on admission, likely secondary to dehydration from nausea/vomiting and inability to tolerate oral intake. --NS at 100 mL/h --BMP daily  Acute renal failure Creatinine 1.64 admission, likely secondary to prerenal azotemia in the setting of dehydration and intractable nausea/vomiting as above. --Continue IV fluid hydration with NS at 100 mL/h --Avoid nephrotoxins, renal dose all medications --BMP in a.m.  Small cell lung cancer with right hilar/suprahilar mass with occlusion of the right upper lobe bronchus right paratracheal/infrahilar/subcarinal lymphadenopathy Follows with medical oncology outpatient, Dr. Julien Nordmann.  Last chemotherapy infusion 12/06/2021.  Now with pancytopenia likely related to chemotherapy. --Oncology consulted as above  Cancer associated pain: --OxyContin 20 mg p.o. every 12 hours --Oxycodone 5 mg p.o. q4h PRN moderate pain -- Dilaudid 0.5 mg IV q4h PRN severe breakthrough pain  COPD Not oxygen dependent at baseline. --Albuterol neb every 2 hours as needed shortness of breath/wheezing  Tobacco use disorder --Counseled on need for complete cessation --Nicotine patch    DVT prophylaxis: SCDs Start: 12/14/21 2118   Code Status: Full Code Family Communication: No family present at bedside this morning  Disposition  Plan:  Level of care: Med-Surg  Status is: Inpatient  Remains inpatient appropriate because: Awaiting oncology consultation, transfusing PRBCs, replacing electrolytes; anticipate discharge home in 3-4 days.      Consultants:  Oncology, Dr. Chryl Heck  Procedures:  None  Antimicrobials:  None   Subjective: Patient seen examined at bedside, resting comfortably.  Complaining of pain to her right-sided chest.  Requesting restart of her home pain medications.  No other specific complaints or concerns at this time.  Currently receiving blood transfusion.  Awaiting oncology evaluation.  Denies headache, no current vomiting/diarrhea, no shortness of breath, no abdominal pain, no fever/chills/night sweats, no paresthesias.  No acute concerns overnight per nursing staff.  Objective: Vitals:   12/15/21 0334 12/15/21 0558 12/15/21 0730 12/15/21 0755  BP: 101/62 129/78 120/77 111/72  Pulse: (!) 108 (!) 118 (!) 120 (!) 121  Resp: 16 16 16 16   Temp: 100 F (37.8 C) 99.6 F (37.6 C) 99.8 F (37.7 C) 100 F (37.8 C)  TempSrc: Oral Oral Oral   SpO2: 97% 98% 99%   Weight:      Height:        Intake/Output Summary (Last 24 hours) at 12/15/2021 1005 Last data filed at 12/15/2021 6468 Gross per 24 hour  Intake 2367 ml  Output 100 ml  Net 2267 ml   Filed Weights   12/15/21 0117  Weight: 68.8 kg    Examination:  General exam: Appears calm and comfortable chronically ill-appearing, appears older than stated age Respiratory system: Clear to auscultation. Respiratory effort normal.  On room air Cardiovascular system: S1 & S2 heard, RRR. No JVD, murmurs, rubs, gallops or clicks. No pedal edema. Gastrointestinal system: Abdomen is nondistended, soft and nontender. No organomegaly or masses felt. Normal bowel sounds heard. Central nervous system: Alert and oriented. No focal neurological deficits. Extremities: Symmetric 5 x 5 power. Skin: No rashes, lesions or ulcers Psychiatry: Judgement and  insight appear normal. Mood & affect appropriate.     Data Reviewed: I have personally reviewed following labs and imaging studies  CBC: Recent Labs  Lab 12/14/21 1626 12/15/21 0307  WBC 0.3* 0.2*  NEUTROABS 0.0*  --   HGB 7.0* 4.8*  HCT 19.5* 14.1*  MCV 95.6 98.6  PLT 6* 31*   Basic Metabolic Panel: Recent Labs  Lab 12/14/21 1626 12/15/21 0307  NA 133* 132*  K 2.7* 2.9*  CL 102 104  CO2 16* 19*  GLUCOSE 145* 116*  BUN 22* 16  CREATININE 1.64* 1.23*  CALCIUM 8.3* 8.0*  MG 1.1* 1.7  PHOS  --  2.0*   GFR: Estimated Creatinine Clearance: 55.2 mL/min (A) (by C-G formula based on SCr of 1.23 mg/dL (H)). Liver Function Tests: Recent Labs  Lab 12/14/21 1626 12/15/21 0307  AST 17 14*  ALT 13 11  ALKPHOS 76 62  BILITOT 0.9 0.9  PROT 7.4 6.0*  ALBUMIN 3.5 2.9*   No results for input(s): LIPASE, AMYLASE in the last 168 hours. No results for input(s): AMMONIA in the last 168 hours. Coagulation Profile: Recent Labs  Lab 12/14/21 1626  INR 0.9   Cardiac Enzymes: No results for input(s): CKTOTAL, CKMB, CKMBINDEX, TROPONINI in the last 168 hours. BNP (last 3 results) No results for input(s): PROBNP in the last 8760 hours. HbA1C: No results for input(s): HGBA1C in the last 72 hours. CBG: No results for input(s): GLUCAP in the last 168 hours. Lipid Profile: No results for input(s): CHOL, HDL, LDLCALC, TRIG, CHOLHDL, LDLDIRECT in the last 72 hours. Thyroid Function Tests: No results for input(s):  TSH, T4TOTAL, FREET4, T3FREE, THYROIDAB in the last 72 hours. Anemia Panel: No results for input(s): VITAMINB12, FOLATE, FERRITIN, TIBC, IRON, RETICCTPCT in the last 72 hours. Sepsis Labs: Recent Labs  Lab 12/14/21 1621 12/14/21 1826 12/14/21 2347 12/15/21 0307  LATICACIDVEN 2.9* 2.1* 0.9 1.6    Recent Results (from the past 240 hour(s))  Culture, blood (routine x 2)     Status: None (Preliminary result)   Collection Time: 12/14/21  4:26 PM   Specimen: BLOOD  RIGHT HAND  Result Value Ref Range Status   Specimen Description   Final    BLOOD RIGHT HAND Performed at [facility] [address] [place], [place] [address]    Special Requests   Final    BOTTLES DRAWN AEROBIC AND ANAEROBIC Blood Culture results may not be optimal due to an inadequate volume of blood received in culture bottles Performed at [facility] [address], [place], [place] [address]    Culture   Final    NO GROWTH < 12 HOURS Performed at [facility] [address], [place], [place] [address]    Report Status PENDING  Incomplete  Culture, blood (routine x 2)     Status: None (Preliminary result)   Collection Time: 12/14/21  4:31 PM   Specimen: Left Antecubital; Blood  Result Value Ref Range Status   Specimen Description   Final    LEFT ANTECUBITAL Performed at [facility] [address], [place], [place] [address]    Special Requests   Final    BOTTLES DRAWN AEROBIC AND ANAEROBIC Blood Culture adequate volume Performed at [facility] [address] [place], [place] [address]    Culture   Final    NO GROWTH < 12 HOURS Performed at [facility] [address] [place],  [address]    Report Status PENDING  Incomplete  Resp Panel by RT-PCR (Flu A&B, Covid) Nasopharyngeal Swab     Status: None   Collection Time: 12/14/21  4:38 PM   Specimen: Nasopharyngeal Swab; Nasopharyngeal(NP) swabs in vial transport medium  Result Value Ref Range Status   SARS Coronavirus 2 by RT PCR NEGATIVE NEGATIVE Final    Comment: (NOTE) SARS-CoV-2 target nucleic acids are NOT DETECTED.  The SARS-CoV-2 RNA is generally detectable in upper respiratory specimens during the acute phase of infection. The lowest concentration of SARS-CoV-2 viral copies this assay can detect is 138 copies/mL. A negative result does not preclude SARS-Cov-2 infection and should not be used as the sole  basis for treatment or other patient management decisions. A negative result may occur with  improper specimen collection/handling, submission of specimen other than nasopharyngeal swab, presence of viral mutation(s) within the areas targeted by this assay, and inadequate number of viral copies(<138 copies/mL). A negative result must be combined with clinical observations, patient history, and epidemiological information. The expected result is Negative.  Fact Sheet for Patients:  EntrepreneurPulse.com.au  Fact Sheet for Healthcare Providers:  IncredibleEmployment.be  This test is no t yet approved or cleared by the [place] FDA and  has been authorized for detection and/or diagnosis of SARS-CoV-2 by FDA under an Emergency Use Authorization (EUA). This EUA will remain  in effect (meaning this test can be used) for the duration of the COVID-19 declaration under Section 564(b)(1) of the Act, 21 U.S.C.section 360bbb-3(b)(1), unless the authorization is terminated  or revoked sooner.       Influenza A by PCR  NEGATIVE NEGATIVE Final   Influenza B by PCR NEGATIVE NEGATIVE Final    Comment: (NOTE) The Xpert Xpress SARS-CoV-2/FLU/RSV plus assay is intended as an aid in the diagnosis of influenza from Nasopharyngeal swab specimens and should not be used as a sole basis for treatment. Nasal washings and aspirates are unacceptable for Xpert Xpress SARS-CoV-2/FLU/RSV testing.  Fact Sheet for Patients: EntrepreneurPulse.com.au  Fact Sheet for Healthcare Providers: IncredibleEmployment.be  This test is not yet approved or cleared by the [place] FDA and has been authorized for detection and/or diagnosis of SARS-CoV-2 by FDA under an Emergency Use Authorization (EUA). This EUA will remain in effect (meaning this test can be used) for the duration of the COVID-19 declaration under Section 564(b)(1) of the Act,  21 U.S.C. section 360bbb-3(b)(1), unless the authorization is terminated or revoked.  Performed at [facility], [place] [address] [place], [place] [address]          Radiology Studies: CT Angio Chest PE W/Cm &/Or Wo Cm  Result Date: 12/14/2021 CLINICAL DATA:  Right chest pain, tachycardia. Lung cancer. Shortness of breath. EXAM: CT ANGIOGRAPHY CHEST WITH CONTRAST TECHNIQUE: Multidetector CT imaging of the chest was performed using the standard protocol during bolus administration of intravenous contrast. Multiplanar CT image reconstructions and MIPs were obtained to evaluate the vascular anatomy. RADIATION DOSE REDUCTION: This exam was performed according to the departmental dose-optimization program which includes automated exposure control, adjustment of the mA and/or kV according to patient size and/or use of iterative reconstruction technique. CONTRAST:  45mL OMNIPAQUE IOHEXOL 350 MG/ML SOLN COMPARISON:  Chest CT 09/15/2021.  PET CT 10/12/2021. FINDINGS: Cardiovascular: No filling defects in the pulmonary arteries to suggest pulmonary emboli. Heart is normal size. Aorta is normal caliber. Mediastinum/Nodes: Esophagus appears thick walled in the upper to midthoracic region. Previously seen right paratracheal adenopathy in the area much improved. No measurable enlarged mediastinal, hilar or axillary adenopathy. Lungs/Pleura: Previously seen right hilar mass improved, measuring 1.5 cm compared to 2 cm on prior PET CT and 6 cm on prior chest CT. Mild nodular airspace disease in the right upper lobe in the area of dense consolidation previously. Scarring in the right apex. Mild emphysema. Left upper lobe nodule posteriorly on image 40 of series 10 measures 5 mm, stable. No new or enlarging pulmonary nodules. No effusions. Upper Abdomen: Imaging into the upper abdomen demonstrates no acute findings. Musculoskeletal: Probable healing pathologic fractures in the right anterior 7th and  8th ribs are again seen, unchanged. No acute fracture. Review of the MIP images confirms the above findings. IMPRESSION: No evidence of pulmonary embolus. Improving mediastinal and right hilar mass/adenopathy. Thickened upper to midthoracic esophagus. 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 Aortic Atherosclerosis (ICD10-I70.0) and Emphysema (ICD10-J43.9). Electronically Signed   By: Rolm Baptise M.D.   On: 12/14/2021 18:36   DG Chest Port 1 View  Result Date: 12/14/2021 CLINICAL DATA:  Chest pain short of breath EXAM: PORTABLE CHEST 1 VIEW COMPARISON:  11/28/2021, CT 09/15/2021 FINDINGS: No acute airspace disease or effusion. Stable cardiomediastinal silhouette. Similar right suprahilar opacity corresponding to known history of lung cancer. No pneumothorax IMPRESSION: No active disease. Similar small right suprahilar opacity, corresponding to history of lung cancer. Electronically Signed   By: Donavan Foil M.D.   On: 12/14/2021 16:41        Scheduled Meds:  dextromethorphan-guaiFENesin  1 tablet Oral BID   nicotine  21 mg  Transdermal Daily   oxyCODONE  20 mg Oral Q12H   potassium chloride  40 mEq Oral Q4H   Continuous Infusions:  sodium chloride     sodium chloride 100 mL/hr at 12/15/21 0644     LOS: 1 day    Time spent: 48 minutes spent on chart review, discussion with nursing staff, consultants, updating family and interview/physical exam; more than 50% of that time was spent in counseling and/or coordination of care.    Clevland Cork J [place] DO [facility] Available via Epic secure chat 7am-7pm After these hours, please refer to coverage provider listed on [email or web address] 12/15/2021, 10:05 AM

## 2021-12-15 NOTE — Plan of Care (Signed)
  Problem: Education: Goal: Knowledge of General Education information will improve Description: Including pain rating scale, medication(s)/side effects and non-pharmacologic comfort measures Outcome: Progressing   Problem: Activity: Goal: Risk for activity intolerance will decrease Outcome: Progressing   Problem: Pain Managment: Goal: General experience of comfort will improve Outcome: Progressing   

## 2021-12-15 NOTE — Consult Note (Signed)
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 Upon presentation to ED, she was found to have severe pancytopenia, and also admitted for intractable nausea, inability to tolerate oral intake. She needed blood transfusion 2 weeks ago. She denies any unsual cough or worsening SOB. No change in bowel habits or urinary habits. She is feeling better now, denies any fevers or chills. Rest of the pertinent 10 point ROS reviewed and negative.  Past Medical History:  Diagnosis Date   Ankle sprain    left   Anxiety    treated for panic attacks in the past.   Asthma    COPD (chronic obstructive pulmonary disease) (HCC)    Depression    History of radiation therapy    Right lung- 09/26/21-11/13/21- Dr. Gery Pray   Irregular heart rate    Lung cancer [facility])    Renal disorder    kidney infections  :   Past Surgical History:  Procedure Laterality Date   APPENDECTOMY     BRONCHIAL BIOPSY  09/21/2021   Procedure: BRONCHIAL BIOPSIES;  Surgeon: Collene Gobble, MD;  Location: [place];  Service: Pulmonary;;   BRONCHIAL BRUSHINGS  09/21/2021   Procedure: BRONCHIAL BRUSHINGS;  Surgeon: Collene Gobble, MD;  Location: [place];  Service: Pulmonary;;   BRONCHIAL NEEDLE ASPIRATION BIOPSY  09/21/2021   Procedure: BRONCHIAL NEEDLE ASPIRATION BIOPSIES;  Surgeon: Collene Gobble, MD;  Location: [facility];  Service: Pulmonary;;   LUMBAR LAMINECTOMY N/A 05/13/2018   Procedure: LEFT LUMBAR FOUR-FIVE MICRODISCECTOMY;  Surgeon: Marybelle Killings, MD;  Location: [place];  Service: Orthopedics;  Laterality:  N/A;   TUBAL LIGATION     TUBAL LIGATION  1998   VIDEO BRONCHOSCOPY WITH ENDOBRONCHIAL ULTRASOUND N/A 09/21/2021   Procedure: VIDEO BRONCHOSCOPY WITH ENDOBRONCHIAL ULTRASOUND;  Surgeon: Collene Gobble, MD;  Location: [place] [facility];  Service: Pulmonary;  Laterality: N/A;  :   Current Facility-Administered Medications  Medication Dose Route Frequency Provider Last Rate Last Admin   0.9 %  sodium chloride infusion (Manually program via Guardrails IV Fluids)   Intravenous Once [place] Eric J, DO       0.9 %  sodium chloride infusion  10 mL/hr Intravenous Once Adefeso, Oladapo, DO       0.9 %  sodium chloride infusion   Intravenous Continuous Lovey Newcomer T, NP 100 mL/hr at 12/15/21 0644 New Bag at 12/15/21 0644   acetaminophen (TYLENOL) tablet 650 mg  650 mg Oral Q6H PRN Adefeso, Oladapo, DO   650 mg at 12/15/21 0998   Or   acetaminophen (TYLENOL) suppository 650 mg  650 mg Rectal Q6H PRN Adefeso, Oladapo, DO       dextromethorphan-guaiFENesin (MUCINEX DM) 30-600 MG per 12 hr tablet 1 tablet  1 tablet Oral BID Adefeso, Oladapo, DO   1 tablet at 12/15/21 3382   guaiFENesin-dextromethorphan (ROBITUSSIN DM) 100-10 MG/5ML syrup 5 mL  5 mL Oral Q4H PRN Adefeso, Oladapo, DO   5 mL at 12/15/21 0121   HYDROmorphone (DILAUDID) injection 0.5 mg  0.5 mg Intravenous Q4H PRN [place] Eric J, DO  nicotine (NICODERM CQ - dosed in mg/24 hours) patch 21 mg  21 mg Transdermal Daily Adefeso, Oladapo, DO   21 mg at 12/15/21 7124   oxyCODONE (Oxy IR/ROXICODONE) immediate release tablet 5 mg  5 mg Oral Q4H PRN [place] Eric J, DO   5 mg at 12/15/21 1041   oxyCODONE (OXYCONTIN) 12 hr tablet 20 mg  20 mg Oral Q12H [place] Donnamarie Poag, DO   20 mg at 12/15/21 5809   potassium chloride SA (KLOR-CON M) CR tablet 40 mEq  40 mEq Oral Q4H [place] Donnamarie Poag, DO   40 mEq at 12/15/21 1041   prochlorperazine (COMPAZINE) injection 10 mg  10 mg Intravenous Q6H PRN Adefeso, Oladapo, DO   10 mg at 12/15/21 0014   Tbo-Filgrastim  (GRANIX) injection 300 mcg  300 mcg Subcutaneous Daily Srinivas Lippman, MD          Allergies  Allergen Reactions   Naproxen Nausea Only   Bactrim [Sulfamethoxazole-Trimethoprim] Nausea Only  :   Family History  Problem Relation Age of Onset   Hypertension Mother    Hypertension Father    Heart disease Father   :   Social History   Socioeconomic History   Marital status: Legally Separated    Spouse name: Not on file   Number of children: Not on file   Years of education: Not on file   Highest education level: Not on file  Occupational History   Not on file  Tobacco Use   Smoking status: Every Day    Packs/day: 0.50    Years: 30.00    Pack years: 15.00    Types: Cigarettes   Smokeless tobacco: Never  Vaping Use   Vaping Use: Never used  Substance and Sexual Activity   Alcohol use: Yes    Comment: rare   Drug use: Not Currently    Types: Marijuana    Comment: occ-   Sexual activity: Not on file  Other Topics Concern   Not on file  Social History Narrative   Not on file   Social Determinants of Health   Financial Resource Strain: Not on file  Food Insecurity: Not on file  Transportation Needs: Not on file  Physical Activity: Not on file  Stress: Not on file  Social Connections: Not on file  Intimate Partner Violence: Not on file  :  Pertinent items are noted in HPI.  Exam: Patient Vitals for the past 24 hrs:  BP Temp Temp src Pulse Resp SpO2 Height Weight  12/15/21 1124 112/76 98.6 F (37 C) -- (!) 120 16 -- -- --  12/15/21 1052 117/81 98.9 F (37.2 C) Oral (!) 116 16 97 % -- --  12/15/21 1050 117/81 98.9 F (37.2 C) Oral (!) 116 16 97 % -- --  12/15/21 0755 111/72 100 F (37.8 C) -- (!) 121 16 -- -- --  12/15/21 0730 120/77 99.8 F (37.7 C) Oral (!) 120 16 99 % -- --  12/15/21 0558 129/78 99.6 F (37.6 C) Oral (!) 118 16 98 % -- --  12/15/21 0334 101/62 100 F (37.8 C) Oral (!) 108 16 97 % -- --  12/15/21 0334 101/62 100 F (37.8 C) --  (!) 108 16 -- -- --  12/15/21 0302 110/61 99.9 F (37.7 C) Oral (!) 114 15 93 % -- --  12/15/21 0241 (!) 102/58 99.9 F (37.7 C) Oral (!) 114 19 92 % -- --  12/15/21 0128 107/65 99.6 F (37.6 C) Oral (!) 113  15 93 % -- --  12/15/21 0128 107/65 99.6 F (37.6 C) -- (!) 113 15 -- -- --  12/15/21 0117 -- -- -- -- -- -- 5\' 8"  (1.727 m) 151 lb 10.8 oz (68.8 kg)  12/15/21 0043 93/60 99.9 F (37.7 C) Oral (!) 117 16 91 % -- --  12/14/21 2147 107/72 99.6 F (37.6 C) Oral (!) 104 15 100 % -- --  12/14/21 2000 110/77 97.7 F (36.5 C) Oral 100 20 100 % -- --  12/14/21 1920 108/76 97.7 F (36.5 C) Oral 99 19 98 % -- --  12/14/21 1845 102/73 -- -- (!) 102 15 98 % -- --  12/14/21 1830 107/73 -- -- 96 19 100 % -- --  12/14/21 1800 114/80 -- -- (!) 102 15 99 % -- --  12/14/21 1730 108/78 -- -- (!) 108 16 99 % -- --  12/14/21 1715 98/75 -- -- (!) 108 (!) 21 98 % -- --  12/14/21 1645 102/71 -- -- (!) 111 18 100 % -- --  12/14/21 1630 112/82 -- -- (!) 124 (!) 21 100 % -- --  12/14/21 1624 108/79 97.8 F (36.6 C) Oral (!) 135 (!) 22 100 % -- --  12/14/21 1614 (!) 104/30 98.4 F (36.9 C) Oral (!) 145 18 100 % -- --    Physical Exam Constitutional:      Appearance: She is well-developed.  HENT:     Head: Normocephalic.     Comments: alopecia Cardiovascular:     Rate and Rhythm: Normal rate and regular rhythm.  Pulmonary:     Effort: No respiratory distress.     Comments: Scattered wheezing, she couldn't take a deep breath, ends up coughing  Abdominal:     Palpations: Abdomen is soft. There is no mass.     Tenderness: There is no abdominal tenderness.  Musculoskeletal:        General: Normal range of motion.     Cervical back: Normal range of motion and neck supple.  Lymphadenopathy:     Cervical: No cervical adenopathy.  Skin:    General: Skin is warm and dry.  Neurological:     General: No focal deficit present.     Mental Status: She is alert.  Psychiatric:        Mood and Affect:  Mood normal.      Lab Results  Component Value Date   WBC 0.2 (LL) 12/15/2021   HGB 4.8 (LL) 12/15/2021   HCT 14.1 (L) 12/15/2021   PLT 31 (L) 12/15/2021   GLUCOSE 116 (H) 12/15/2021   ALT 11 12/15/2021   AST 14 (L) 12/15/2021   NA 132 (L) 12/15/2021   K 2.9 (L) 12/15/2021   CL 104 12/15/2021   CREATININE 1.23 (H) 12/15/2021   BUN 16 12/15/2021   CO2 19 (L) 12/15/2021    DG Chest 2 View  Result Date: 11/28/2021 CLINICAL DATA:  Cough, neutropenia and chills. EXAM: CHEST - 2 VIEW COMPARISON:  09/21/2021 FINDINGS: Normal heart size. No signs of pleural effusion or edema. Significant reduction and previously noted perihilar right upper lobe lung mass consistent with known non-small cell lung cancer. No acute airspace consolidation or atelectasis. No acute osseous findings. Previously noted multifocal FDG avid bone metastases are better seen on prior PET-CT from 10/12/2021. IMPRESSION: 1. No acute cardiopulmonary abnormalities. 2. Significant reduction in right upper lobe lung mass consistent with known non-small cell lung cancer. Electronically Signed   By: Queen Slough.D.  On: 11/28/2021 16:17   CT Angio Chest PE W/Cm &/Or Wo Cm  Result Date: 12/14/2021 CLINICAL DATA:  Right chest pain, tachycardia. Lung cancer. Shortness of breath. EXAM: CT ANGIOGRAPHY CHEST WITH CONTRAST TECHNIQUE: Multidetector CT imaging of the chest was performed using the standard protocol during bolus administration of intravenous contrast. Multiplanar CT image reconstructions and MIPs were obtained to evaluate the vascular anatomy. RADIATION DOSE REDUCTION: This exam was performed according to the departmental dose-optimization program which includes automated exposure control, adjustment of the mA and/or kV according to patient size and/or use of iterative reconstruction technique. CONTRAST:  12mL OMNIPAQUE IOHEXOL 350 MG/ML SOLN COMPARISON:  Chest CT 09/15/2021.  PET CT 10/12/2021. FINDINGS: Cardiovascular: No  filling defects in the pulmonary arteries to suggest pulmonary emboli. Heart is normal size. Aorta is normal caliber. Mediastinum/Nodes: Esophagus appears thick walled in the upper to midthoracic region. Previously seen right paratracheal adenopathy in the area much improved. No measurable enlarged mediastinal, hilar or axillary adenopathy. Lungs/Pleura: Previously seen right hilar mass improved, measuring 1.5 cm compared to 2 cm on prior PET CT and 6 cm on prior chest CT. Mild nodular airspace disease in the right upper lobe in the area of dense consolidation previously. Scarring in the right apex. Mild emphysema. Left upper lobe nodule posteriorly on image 40 of series 10 measures 5 mm, stable. No new or enlarging pulmonary nodules. No effusions. Upper Abdomen: Imaging into the upper abdomen demonstrates no acute findings. Musculoskeletal: Probable healing pathologic fractures in the right anterior 7th and 8th ribs are again seen, unchanged. No acute fracture. Review of the MIP images confirms the above findings. IMPRESSION: No evidence of pulmonary embolus. Improving mediastinal and right hilar mass/adenopathy. Thickened upper to midthoracic esophagus. 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 Aortic Atherosclerosis (ICD10-I70.0) and Emphysema (ICD10-J43.9). Electronically Signed   By: Rolm Baptise M.D.   On: 12/14/2021 18:36   DG Chest Port 1 View  Result Date: 12/14/2021 CLINICAL DATA:  Chest pain short of breath EXAM: PORTABLE CHEST 1 VIEW COMPARISON:  11/28/2021, CT 09/15/2021 FINDINGS: No acute airspace disease or effusion. Stable cardiomediastinal silhouette. Similar right suprahilar opacity corresponding to known history of lung cancer. No pneumothorax IMPRESSION: No active disease. Similar small right suprahilar opacity, corresponding to history of lung cancer. Electronically Signed   By: Donavan Foil M.D.   On: 12/14/2021  16:41    DG Chest 2 View  Result Date: 11/28/2021 CLINICAL DATA:  Cough, neutropenia and chills. EXAM: CHEST - 2 VIEW COMPARISON:  09/21/2021 FINDINGS: Normal heart size. No signs of pleural effusion or edema. Significant reduction and previously noted perihilar right upper lobe lung mass consistent with known non-small cell lung cancer. No acute airspace consolidation or atelectasis. No acute osseous findings. Previously noted multifocal FDG avid bone metastases are better seen on prior PET-CT from 10/12/2021. IMPRESSION: 1. No acute cardiopulmonary abnormalities. 2. Significant reduction in right upper lobe lung mass consistent with known non-small cell lung cancer. Electronically Signed   By: Kerby Moors M.D.   On: 11/28/2021 16:17   CT Angio Chest PE W/Cm &/Or Wo Cm  Result Date: 12/14/2021 CLINICAL DATA:  Right chest pain, tachycardia. Lung cancer. Shortness of breath. EXAM: CT ANGIOGRAPHY CHEST WITH CONTRAST TECHNIQUE: Multidetector CT imaging of the chest was performed using the standard protocol during bolus administration of intravenous contrast. Multiplanar CT image reconstructions and MIPs were obtained to evaluate the vascular anatomy. RADIATION DOSE  REDUCTION: This exam was performed according to the departmental dose-optimization program which includes automated exposure control, adjustment of the mA and/or kV according to patient size and/or use of iterative reconstruction technique. CONTRAST:  72mL OMNIPAQUE IOHEXOL 350 MG/ML SOLN COMPARISON:  Chest CT 09/15/2021.  PET CT 10/12/2021. FINDINGS: Cardiovascular: No filling defects in the pulmonary arteries to suggest pulmonary emboli. Heart is normal size. Aorta is normal caliber. Mediastinum/Nodes: Esophagus appears thick walled in the upper to midthoracic region. Previously seen right paratracheal adenopathy in the area much improved. No measurable enlarged mediastinal, hilar or axillary adenopathy. Lungs/Pleura: Previously seen right hilar  mass improved, measuring 1.5 cm compared to 2 cm on prior PET CT and 6 cm on prior chest CT. Mild nodular airspace disease in the right upper lobe in the area of dense consolidation previously. Scarring in the right apex. Mild emphysema. Left upper lobe nodule posteriorly on image 40 of series 10 measures 5 mm, stable. No new or enlarging pulmonary nodules. No effusions. Upper Abdomen: Imaging into the upper abdomen demonstrates no acute findings. Musculoskeletal: Probable healing pathologic fractures in the right anterior 7th and 8th ribs are again seen, unchanged. No acute fracture. Review of the MIP images confirms the above findings. IMPRESSION: No evidence of pulmonary embolus. Improving mediastinal and right hilar mass/adenopathy. Thickened upper to midthoracic esophagus. 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 Aortic Atherosclerosis (ICD10-I70.0) and Emphysema (ICD10-J43.9). Electronically Signed   By: Rolm Baptise M.D.   On: 12/14/2021 18:36   DG Chest Port 1 View  Result Date: 12/14/2021 CLINICAL DATA:  Chest pain short of breath EXAM: PORTABLE CHEST 1 VIEW COMPARISON:  11/28/2021, CT 09/15/2021 FINDINGS: No acute airspace disease or effusion. Stable cardiomediastinal silhouette. Similar right suprahilar opacity corresponding to known history of lung cancer. No pneumothorax IMPRESSION: No active disease. Similar small right suprahilar opacity, corresponding to history of lung cancer. Electronically Signed   By: Donavan Foil M.D.   On: 12/14/2021 16:41    Assessment and Plan:   This is a very pleasant 51 yr old female with T3N2 small cell lung cancer recently completed chemotherapy with cisplatin and etoposide, last week completed concurrent portion of radiation about 2 weeks ago admitted with worsening nausea, fatigue, dizziness and some chest pain. She was found to have severe cytopenias on admission. CT PE protocol on  admission showed no evidence of PE, improved mediastinal and right hilar adenopathy some signs of radiation esophagitis. She feels better since admission. She is receiving blood at this time. Platelet count improved after transfusion. Nausea and vomiting currently controlled on PRN compazine Pain reasonably controlled on current pain medication, she is oxycontin 20 mg ever 12 hrs, PRN oxcodone and PRN dilaudid No clear evidence of infection but severe cytopenias, started her on filgrastim. Please discontinue if ANC count greater than 5000 or ANC count greater than 1000 on 2 consecutive days. Hypokalemia, received potassium replacement, still low, primary team assisting with it. AKI improving with hydration Agree with transfusion to maintain a Hb of 7 and platelet count of 20 K. Will inform Dr Julien Nordmann of patient's admission.  The length of time of the face-to-face encounter was 50 minutes. More than 50% of time was spent counseling and coordination of care.     Thank you for this referral.

## 2021-12-15 NOTE — Progress Notes (Signed)
The patient has a MEWS of 3 and a heart rate of 117. Messaged [employer].

## 2021-12-15 NOTE — Progress Notes (Signed)
The patient has a pain level of 9 and is requesting IV Dilaudid of equivalent pain medication. Messaged Dr. Josephine Cables

## 2021-12-15 NOTE — Progress Notes (Signed)
MEWS is 3. Messaged [employer].    12/15/21 0043  Vitals  Temp 99.9 F (37.7 C)  Temp Source Oral  BP 93/60  MAP (mmHg) 69  BP Location Right Arm  BP Method Automatic  Patient Position (if appropriate) Lying  Pulse Rate (!) 117  Pulse Rate Source Monitor  MEWS COLOR  MEWS Score Color Yellow  Oxygen Therapy  SpO2 91 %

## 2021-12-15 NOTE — Progress Notes (Signed)
The patient's WBC count is 0.2 and her hemoglobin is 4.8. This lab draw was done after platelets were administered and before blood administration. Messaged [employer].

## 2021-12-16 DIAGNOSIS — R0781 Pleurodynia: Secondary | ICD-10-CM | POA: Diagnosis not present

## 2021-12-16 DIAGNOSIS — R112 Nausea with vomiting, unspecified: Secondary | ICD-10-CM | POA: Diagnosis not present

## 2021-12-16 DIAGNOSIS — D61818 Other pancytopenia: Secondary | ICD-10-CM | POA: Diagnosis not present

## 2021-12-16 DIAGNOSIS — N179 Acute kidney failure, unspecified: Secondary | ICD-10-CM | POA: Diagnosis not present

## 2021-12-16 LAB — BASIC METABOLIC PANEL
Anion gap: 8 (ref 5–15)
BUN: 12 mg/dL (ref 6–20)
CO2: 18 mmol/L — ABNORMAL LOW (ref 22–32)
Calcium: 7.7 mg/dL — ABNORMAL LOW (ref 8.9–10.3)
Chloride: 105 mmol/L (ref 98–111)
Creatinine, Ser: 0.94 mg/dL (ref 0.44–1.00)
GFR, Estimated: >60 mL/min (ref >60)
Glucose, Bld: 124 mg/dL — ABNORMAL HIGH (ref 70–99)
Potassium: 2.7 mmol/L — CL (ref 3.5–5.1)
Sodium: 131 mmol/L — ABNORMAL LOW (ref 135–145)

## 2021-12-16 LAB — CBC WITH DIFFERENTIAL/PLATELET
HCT: 25.1 % — ABNORMAL LOW (ref 36.0–46.0)
Hemoglobin: 9 g/dL — ABNORMAL LOW (ref 12.0–15.0)
MCH: 32.1 pg (ref 26.0–34.0)
MCHC: 35.9 g/dL (ref 30.0–36.0)
MCV: 89.6 fL (ref 80.0–100.0)
Platelets: 18 10*3/uL — CL (ref 150–400)
RBC: 2.8 MIL/uL — ABNORMAL LOW (ref 3.87–5.11)
RDW: 15.5 % (ref 11.5–15.5)
WBC: 0.2 10*3/uL — CL (ref 4.0–10.5)
nRBC: 0 % (ref 0.0–0.2)

## 2021-12-16 LAB — MAGNESIUM: Magnesium: 1 mg/dL — ABNORMAL LOW (ref 1.7–2.4)

## 2021-12-16 MED ORDER — ADULT MULTIVITAMIN W/MINERALS CH
1.0000 | ORAL_TABLET | Freq: Every day | ORAL | Status: DC
  Administered 2021-12-16 – 2021-12-27 (×9): 1 via ORAL
  Filled 2021-12-16 (×12): qty 1

## 2021-12-16 MED ORDER — ONDANSETRON HCL 4 MG/2ML IJ SOLN
4.0000 mg | Freq: Four times a day (QID) | INTRAMUSCULAR | Status: DC | PRN
  Administered 2021-12-16 – 2021-12-27 (×18): 4 mg via INTRAVENOUS
  Filled 2021-12-16 (×21): qty 2

## 2021-12-16 MED ORDER — POTASSIUM CHLORIDE IN NACL 20-0.9 MEQ/L-% IV SOLN
INTRAVENOUS | Status: DC
  Filled 2021-12-16 (×5): qty 1000

## 2021-12-16 MED ORDER — POTASSIUM CHLORIDE 10 MEQ/100ML IV SOLN
10.0000 meq | INTRAVENOUS | Status: AC
  Administered 2021-12-16 (×3): 10 meq via INTRAVENOUS
  Filled 2021-12-16 (×3): qty 100

## 2021-12-16 MED ORDER — HYDROMORPHONE HCL 1 MG/ML IJ SOLN
1.0000 mg | INTRAMUSCULAR | Status: DC | PRN
  Administered 2021-12-16 – 2021-12-18 (×10): 1 mg via INTRAVENOUS
  Filled 2021-12-16 (×11): qty 1

## 2021-12-16 MED ORDER — POTASSIUM CHLORIDE CRYS ER 20 MEQ PO TBCR
40.0000 meq | EXTENDED_RELEASE_TABLET | ORAL | Status: AC
  Administered 2021-12-16 (×2): 40 meq via ORAL
  Filled 2021-12-16 (×2): qty 2

## 2021-12-16 MED ORDER — MAGNESIUM SULFATE 4 GM/100ML IV SOLN
4.0000 g | Freq: Once | INTRAVENOUS | Status: AC
  Administered 2021-12-16: 4 g via INTRAVENOUS
  Filled 2021-12-16: qty 100

## 2021-12-16 MED ORDER — SODIUM CHLORIDE 0.9% IV SOLUTION: Freq: Once | INTRAVENOUS | Status: AC

## 2021-12-16 MED ORDER — POTASSIUM CHLORIDE CRYS ER 20 MEQ PO TBCR
40.0000 meq | EXTENDED_RELEASE_TABLET | Freq: Two times a day (BID) | ORAL | Status: DC
  Administered 2021-12-16: 40 meq via ORAL
  Filled 2021-12-16: qty 2

## 2021-12-16 MED ORDER — BOOST / RESOURCE BREEZE PO LIQD CUSTOM
1.0000 | Freq: Three times a day (TID) | ORAL | Status: DC
  Administered 2021-12-16 – 2021-12-20 (×2): 1 via ORAL

## 2021-12-16 NOTE — Plan of Care (Signed)
  Problem: Education: Goal: Knowledge of General Education information will improve Description: Including pain rating scale, medication(s)/side effects and non-pharmacologic comfort measures Outcome: Progressing   Problem: Activity: Goal: Risk for activity intolerance will decrease Outcome: Progressing   Problem: Nutrition: Goal: Adequate nutrition will be maintained Outcome: Progressing   

## 2021-12-16 NOTE — Progress Notes (Signed)
Coughing fit produced frank red blood. Medicated for pain. Reports feeling OK now.  On-Call updated.

## 2021-12-16 NOTE — Progress Notes (Signed)
PROGRESS NOTE    [name]  JHE:[id number] DOB: 01-07-1971 DOA: 12/14/2021 PCP: [name], PA-C    Brief Narrative:  [name] is a 51 year old female with past medical history significant for small cell lung cancer with right hilar/suprahilar mass with occlusion of the right upper lobe bronchus right paratracheal/infrahilar/subcarinal lymphadenopathy diagnosed October 2022 currently on chemotherapy and radiation, COPD, tobacco use disorder who presented to [facility] ED on 1/20 with nausea/vomiting, shortness of breath, right-sided chest pain.  Currently on chemotherapy, last infusion on 12/06/2021.  Her nausea and vomiting has progressed and she has been unable to keep her medications down.  Patient denied fever, no chills, no headache, no diarrhea..  In the ED, temperature 98.4 F, HR 145, RR 18, BP 104/30, SPO2 100% on room air.  Sodium 133, potassium 3.7, chloride 102, CO2 16, glucose 145, BUN 22, creatinine 1.64, AST 17, ALT 13, total bilirubin 0.9.  WBC 0.3, hemoglobin 7.0, platelets 6.  High sensitivity troponin 3, <2.  Lactic acid 2.9.  Chest x-ray with no active cardiopulmonary disease process, similar small right suprahilar opacity.  CT angiogram chest with no evidence of pulmonary embolism, improving mediastinal/right hilar mass/adenopathy.  Oncology was consulted.  TRH consulted for further evaluation and management of intractable nausea/vomiting, pancytopenia.   Assessment & Plan:   Principal Problem:   Nausea and vomiting Active Problems:   Dehydration   Chest pain   Lactic acidosis   Hypomagnesemia   Hypokalemia   Hyponatremia   Hyperglycemia   Leukopenia due to antineoplastic chemotherapy (HCC)   Pancytopenia (HCC)   Normocytic anemia   AKI (acute kidney injury) ([place])   Generalized weakness   Intractable nausea/vomiting Lactic acidosis: Resolved Patient presenting to ED with progressive nausea and vomiting, and inability to tolerate oral intake or her  home medications.  Elevated lactic acid of 2.9 admission, likely secondary to dehydration. --Full liquid diet, will advance as tolerates --NS at 100 mL/h --Compazine 10 mg IV q6h PRN --Zofran 4 mg IV every 6 hours as needed refractory nausea/vomiting  Pancytopenia High presentation, WBC count 0.3, hemoglobin 7.0, platelets 6.  Transfused 1 unit platelets with improvement to 31.  Hemoglobin down to 4.8.  Etiology likely secondary to active chemotherapy. --Oncology following; appreciate assistance: Pending --WBC 0.3>0.2>0.2 --Hgb 7.0>4.8>10.4>9.0 (s/p 2u pRBC 1/21) --Plt 6>31>9 (s/p 1u Plt 1/20) --Transfuse 1 unit platelets today --Granix 300 mcg Natural Steps daily (start 1/21) --CBC w/ diff daily  Hypokalemia Hypomagnesemia Potassium 2.7 this morning, magnesium 1.0; will continue repletion. --Follow electrolytes daily  Hyponatremia Sodium 133 on admission, likely secondary to dehydration from nausea/vomiting and inability to tolerate oral intake. --NS at 100 mL/h --BMP daily  Acute renal failure: resolved Creatinine 1.64 admission, likely secondary to prerenal azotemia in the setting of dehydration and intractable nausea/vomiting as above. --Cr 1.64>0.94 --Continue IV fluid hydration with NS at 100 mL/h --Avoid nephrotoxins, renal dose all medications --BMP in a.m.  Small cell lung cancer with right hilar/suprahilar mass with occlusion of the right upper lobe bronchus right paratracheal/infrahilar/subcarinal lymphadenopathy Follows with medical oncology outpatient, Dr. Julien Nordmann.  Last chemotherapy infusion 12/06/2021.  Now with pancytopenia likely related to chemotherapy. --Oncology following  Cancer associated pain: --OxyContin 20 mg p.o. every 12 hours --Oxycodone 5 mg p.o. q4h PRN moderate pain -- Dilaudid 0.5 mg IV q4h PRN severe breakthrough pain  COPD Not oxygen dependent at baseline. --Albuterol neb every 2 hours as needed shortness of breath/wheezing  Tobacco use  disorder --Counseled on need for complete cessation --  Nicotine patch    DVT prophylaxis: SCDs Start: 12/14/21 2118   Code Status: Full Code Family Communication: No family present at bedside this morning  Disposition Plan:  Level of care: Med-Surg Status is: Inpatient  Remains inpatient appropriate because: Awaiting oncology consultation, transfusing PRBCs, replacing electrolytes; anticipate discharge home in 3-4 days.      Consultants:  Oncology, Dr. Chryl Heck  Procedures:  None  Antimicrobials:  None   Subjective: Patient seen examined at bedside, mildly distressed this morning.  Complaining of pain, nausea and dry heaves.  Requesting more pain medication.  Discussed further electrolyte replacement and platelet infusion today.  Started on Granix yesterday.  No other specific complaints or concerns at this time.  Denies headache, no diarrhea, no shortness of breath, no abdominal pain, no fever/chills/night sweats, no paresthesias.  No acute concerns overnight per nursing staff.  Objective: Vitals:   12/16/21 0629 12/16/21 0845 12/16/21 0912 12/16/21 0938  BP: (!) 142/82 123/64 133/83 132/84  Pulse: (!) 113 (!) 127 (!) 126 (!) 125  Resp: 20 18 18 20   Temp: 99.3 F (37.4 C)  99.5 F (37.5 C)   TempSrc: Oral Oral    SpO2: 95% 94%  91%  Weight:      Height:        Intake/Output Summary (Last 24 hours) at 12/16/2021 1027 Last data filed at 12/16/2021 0600 Gross per 24 hour  Intake 3846.42 ml  Output 1 ml  Net 3845.42 ml   Filed Weights   12/15/21 0117  Weight: 68.8 kg    Examination:  General exam: Appears calm and comfortable chronically ill-appearing, appears older than stated age Respiratory system: Clear to auscultation. Respiratory effort normal.  On room air Cardiovascular system: S1 & S2 heard, RRR. No JVD, murmurs, rubs, gallops or clicks. No pedal edema. Gastrointestinal system: Abdomen is nondistended, soft and nontender. No organomegaly or masses felt.  Normal bowel sounds heard. Central nervous system: Alert and oriented. No focal neurological deficits. Extremities: Symmetric 5 x 5 power. Skin: No rashes, lesions or ulcers Psychiatry: Judgement and insight appear normal. Mood & affect appropriate.     Data Reviewed: I have personally reviewed following labs and imaging studies  CBC: Recent Labs  Lab 12/14/21 1626 12/15/21 0307 12/15/21 1648 12/16/21 0304  WBC 0.3* 0.2*  --  0.2*  NEUTROABS 0.0*  --   --  0.0*  HGB 7.0* 4.8* 10.4* 9.0*  HCT 19.5* 14.1* 29.1* 24.8*  MCV 95.6 98.6  --  89.5  PLT 6* 31*  --  9*   Basic Metabolic Panel: Recent Labs  Lab 12/14/21 1626 12/15/21 0307 12/16/21 0304  NA 133* 132* 131*  K 2.7* 2.9* 2.7*  CL 102 104 105  CO2 16* 19* 18*  GLUCOSE 145* 116* 124*  BUN 22* 16 12  CREATININE 1.64* 1.23* 0.94  CALCIUM 8.3* 8.0* 7.7*  MG 1.1* 1.7 1.0*  PHOS  --  2.0*  --    GFR: Estimated Creatinine Clearance: 72.2 mL/min (by C-G formula based on SCr of 0.94 mg/dL). Liver Function Tests: Recent Labs  Lab 12/14/21 1626 12/15/21 0307  AST 17 14*  ALT 13 11  ALKPHOS 76 62  BILITOT 0.9 0.9  PROT 7.4 6.0*  ALBUMIN 3.5 2.9*   No results for input(s): LIPASE, AMYLASE in the last 168 hours. No results for input(s): AMMONIA in the last 168 hours. Coagulation Profile: Recent Labs  Lab 12/14/21 1626  INR 0.9   Cardiac Enzymes: No results for input(s): CKTOTAL,  CKMB, CKMBINDEX, TROPONINI in the last 168 hours. BNP (last 3 results) No results for input(s): PROBNP in the last 8760 hours. HbA1C: No results for input(s): HGBA1C in the last 72 hours. CBG: No results for input(s): GLUCAP in the last 168 hours. Lipid Profile: No results for input(s): CHOL, HDL, LDLCALC, TRIG, CHOLHDL, LDLDIRECT in the last 72 hours. Thyroid Function Tests: No results for input(s): TSH, T4TOTAL, FREET4, T3FREE, THYROIDAB in the last 72 hours. Anemia Panel: No results for input(s): VITAMINB12, FOLATE, FERRITIN,  TIBC, IRON, RETICCTPCT in the last 72 hours. Sepsis Labs: Recent Labs  Lab 12/14/21 1621 12/14/21 1826 12/14/21 2347 12/15/21 0307  LATICACIDVEN 2.9* 2.1* 0.9 1.6    Recent Results (from the past 240 hour(s))  Culture, blood (routine x 2)     Status: None (Preliminary result)   Collection Time: 12/14/21  4:26 PM   Specimen: BLOOD RIGHT HAND  Result Value Ref Range Status   Specimen Description   Final    BLOOD RIGHT HAND Performed at [facility] [address] [place], [place] [address]    Special Requests   Final    BOTTLES DRAWN AEROBIC AND ANAEROBIC Blood Culture results may not be optimal due to an inadequate volume of blood received in culture bottles Performed at [facility] [address] [place], [place] [address]    Culture   Final    NO GROWTH 2 DAYS Performed at [facility] [address], [place], [place] [address]    Report Status PENDING  Incomplete  Culture, blood (routine x 2)     Status: None (Preliminary result)   Collection Time: 12/14/21  4:31 PM   Specimen: Left Antecubital; Blood  Result Value Ref Range Status   Specimen Description   Final    LEFT ANTECUBITAL Performed at [facility] [address], [place], [place] [address]    Special Requests   Final    BOTTLES DRAWN AEROBIC AND ANAEROBIC Blood Culture adequate volume Performed at [facility] [address] [place],  [address]    Culture   Final    NO GROWTH 2 DAYS Performed at [facility] [address] [place],  [address]    Report Status PENDING  Incomplete  Resp Panel by RT-PCR (Flu A&B, Covid) Nasopharyngeal Swab     Status: None   Collection Time: 12/14/21  4:38 PM   Specimen: Nasopharyngeal Swab; Nasopharyngeal(NP) swabs in vial transport medium  Result Value Ref Range Status   SARS Coronavirus 2 by RT PCR NEGATIVE NEGATIVE Final    Comment: (NOTE) SARS-CoV-2 target  nucleic acids are NOT DETECTED.  The SARS-CoV-2 RNA is generally detectable in upper respiratory specimens during the acute phase of infection. The lowest concentration of SARS-CoV-2 viral copies this assay can detect is 138 copies/mL. A negative result does not preclude SARS-Cov-2 infection and should not be used as the sole basis for treatment or other patient management decisions. A negative result may occur with  improper specimen collection/handling, submission of specimen other than nasopharyngeal swab, presence of viral mutation(s) within the areas targeted by this assay, and inadequate number of viral copies(<138 copies/mL). A negative result must be combined with clinical observations, patient history, and epidemiological information. The expected result is Negative.  Fact Sheet for Patients:  EntrepreneurPulse.com.au  Fact Sheet for Healthcare Providers:  IncredibleEmployment.be  This test is no t yet approved or cleared by the [place] and  has been authorized for detection and/or diagnosis of SARS-CoV-2 by FDA under an Emergency Use Authorization (EUA). This EUA will remain  in effect (meaning this test can be used) for the duration of the COVID-19 declaration under Section 564(b)(1) of the Act, 21 U.S.C.section 360bbb-3(b)(1), unless the authorization is terminated  or revoked sooner.       Influenza A by PCR NEGATIVE NEGATIVE Final   Influenza B by PCR NEGATIVE NEGATIVE Final    Comment: (NOTE) The Xpert Xpress SARS-CoV-2/FLU/RSV plus assay is intended as an aid in the diagnosis of influenza from Nasopharyngeal swab specimens and should not be used as a sole basis for treatment. Nasal washings and aspirates are unacceptable for Xpert Xpress SARS-CoV-2/FLU/RSV testing.  Fact Sheet for Patients: EntrepreneurPulse.com.au  Fact Sheet for Healthcare  Providers: IncredibleEmployment.be  This test is not yet approved or cleared by the [place] FDA and has been authorized for detection and/or diagnosis of SARS-CoV-2 by FDA under an Emergency Use Authorization (EUA). This EUA will remain in effect (meaning this test can be used) for the duration of the COVID-19 declaration under Section 564(b)(1) of the Act, 21 U.S.C. section 360bbb-3(b)(1), unless the authorization is terminated or revoked.  Performed at [facility], [place] [address], [place], [place] [address]          Radiology Studies: CT Angio Chest PE W/Cm &/Or Wo Cm  Result Date: 12/14/2021 CLINICAL DATA:  Right chest pain, tachycardia. Lung cancer. Shortness of breath. EXAM: CT ANGIOGRAPHY CHEST WITH CONTRAST TECHNIQUE: Multidetector CT imaging of the chest was performed using the standard protocol during bolus administration of intravenous contrast. Multiplanar CT image reconstructions and MIPs were obtained to evaluate the vascular anatomy. RADIATION DOSE REDUCTION: This exam was performed according to the departmental dose-optimization program which includes automated exposure control, adjustment of the mA and/or kV according to patient size and/or use of iterative reconstruction technique. CONTRAST:  58mL OMNIPAQUE IOHEXOL 350 MG/ML SOLN COMPARISON:  Chest CT 09/15/2021.  PET CT 10/12/2021. FINDINGS: Cardiovascular: No filling defects in the pulmonary arteries to suggest pulmonary emboli. Heart is normal size. Aorta is normal caliber. Mediastinum/Nodes: Esophagus appears thick walled in the upper to midthoracic region. Previously seen right paratracheal adenopathy in the area much improved. No measurable enlarged mediastinal, hilar or axillary adenopathy. Lungs/Pleura: Previously seen right hilar mass improved, measuring 1.5 cm compared to 2 cm on prior PET CT and 6 cm on prior chest CT. Mild nodular airspace disease in the right upper lobe  in the area of dense consolidation previously. Scarring in the right apex. Mild emphysema. Left upper lobe nodule posteriorly on image 40 of series 10 measures 5 mm, stable. No new or enlarging pulmonary nodules. No effusions. Upper Abdomen: Imaging into the upper abdomen demonstrates no acute findings. Musculoskeletal: Probable healing pathologic fractures in the right anterior 7th and 8th ribs are again seen, unchanged. No acute fracture. Review of the MIP images confirms the above findings. IMPRESSION: No evidence of pulmonary embolus. Improving mediastinal and right hilar mass/adenopathy. Thickened upper to midthoracic esophagus. 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 Aortic Atherosclerosis (ICD10-I70.0) and Emphysema (ICD10-J43.9). Electronically Signed   By: Rolm Baptise M.D.   On: 12/14/2021 18:36   DG Chest Port 1 View  Result Date: 12/14/2021 CLINICAL DATA:  Chest pain short of breath EXAM: PORTABLE CHEST 1 VIEW COMPARISON:  11/28/2021, CT 09/15/2021 FINDINGS: No acute airspace disease or effusion. Stable cardiomediastinal  silhouette. Similar right suprahilar opacity corresponding to known history of lung cancer. No pneumothorax IMPRESSION: No active disease. Similar small right suprahilar opacity, corresponding to history of lung cancer. Electronically Signed   By: Donavan Foil M.D.   On: 12/14/2021 16:41        Scheduled Meds:  sodium chloride   Intravenous Once   dextromethorphan-guaiFENesin  1 tablet Oral BID   nicotine  21 mg Transdermal Daily   oxyCODONE  20 mg Oral Q12H   potassium chloride  40 mEq Oral Q3H   Tbo-Filgrastim  300 mcg Subcutaneous Daily   Continuous Infusions:  sodium chloride     magnesium sulfate bolus IVPB 4 g (12/16/21 0906)     LOS: 2 days    Time spent: 47 minutes spent on chart review, discussion with nursing staff, consultants, updating family and interview/physical exam;  more than 50% of that time was spent in counseling and/or coordination of care.    Findley Vi J [place] DO [facility] Available via Epic secure chat 7am-7pm After these hours, please refer to coverage provider listed on [email or web address] 12/16/2021, 10:27 AM

## 2021-12-16 NOTE — Progress Notes (Signed)
Initial Nutrition Assessment  INTERVENTION:   -Recommend repletion of phosphorus (2.0 on 1/21)  -Boost Breeze po TID, each supplement provides 250 kcal and 9 grams of protein  -Multivitamin with minerals daily  -Once N/V more controlled, can switch to Ensure Plus High Protein po BID, each supplement provides 350 kcal and 20 grams of protein.   NUTRITION DIAGNOSIS:   Increased nutrient needs related to cancer and cancer related treatments as evidenced by estimated needs.  GOAL:   Patient will meet greater than or equal to 90% of their needs  MONITOR:   PO intake, Supplement acceptance, Labs, Weight trends, I & O's  REASON FOR ASSESSMENT:   Malnutrition Screening Tool    ASSESSMENT:   51 year old female with past medical history significant for small cell lung cancer with right hilar/suprahilar mass with occlusion of the right upper lobe bronchus right paratracheal/infrahilar/subcarinal lymphadenopathy diagnosed October 2022 currently on chemotherapy and radiation, COPD, tobacco use disorder who presented to [facility] ED on 1/20 with nausea/vomiting, shortness of breath, right-sided chest pain.  Currently on chemotherapy, last infusion on 12/06/2021.  Her nausea and vomiting has progressed and she has been unable to keep her medications down.  Unable to reach patient at this time. Will gather history and attempt NFPE in person at a later date.  Per chart review, pt with history of chemotherapy and radiation for small cell lung Ca. Last chemo 1/12. Pt developed N/V 3 days PTA. Pt has been unable to keep anything down including her medications. Labs show low levels of her electrolytes.  Will order Boost Breeze given continued N/V, dry heaves today. Pt now on full liquids. If N/V can be better controlled will switch to Ensure for more kcals/protein.   Per weight records, pt has lost 28 lbs since 10/25/21 (15% wt loss x 1.5 months, significant for time frame). Suspect some degree of  malnutrition is present.  Medications: KLOR-CON, IV Mg sulfate, KCl  Labs reviewed:  Low Na, K, Phos, Mg  NUTRITION - FOCUSED PHYSICAL EXAM:  Unable to complete, working remotely.  Diet Order:   Diet Order             Diet full liquid Room service appropriate? Yes; Fluid consistency: Thin  Diet effective now                   EDUCATION NEEDS:   No education needs have been identified at this time  Skin:  Skin Assessment: Reviewed RN Assessment  Last BM:  1/21  Height:   Ht Readings from Last 1 Encounters:  12/15/21 5\' 8"  (1.727 m)    Weight:   Wt Readings from Last 1 Encounters:  12/15/21 68.8 kg    BMI:  Body mass index is 23.06 kg/m.  Estimated Nutritional Needs:   Kcal:  2100-2300  Protein:  105-120g  Fluid:  2.1L/day  Clayton Bibles, MS, RD, LDN Inpatient Clinical Dietitian Contact information available via Amion

## 2021-12-17 ENCOUNTER — Inpatient Hospital Stay (HOSPITAL_COMMUNITY): Payer: Medicaid Other

## 2021-12-17 ENCOUNTER — Other Ambulatory Visit: Payer: Self-pay

## 2021-12-17 DIAGNOSIS — N179 Acute kidney failure, unspecified: Secondary | ICD-10-CM | POA: Diagnosis not present

## 2021-12-17 DIAGNOSIS — R112 Nausea with vomiting, unspecified: Secondary | ICD-10-CM | POA: Diagnosis not present

## 2021-12-17 DIAGNOSIS — D61818 Other pancytopenia: Secondary | ICD-10-CM | POA: Diagnosis not present

## 2021-12-17 DIAGNOSIS — R0781 Pleurodynia: Secondary | ICD-10-CM | POA: Diagnosis not present

## 2021-12-17 LAB — BPAM RBC
Blood Product Expiration Date: 202302082359
Blood Product Expiration Date: 202302092359
Blood Product Expiration Date: 202302092359
ISSUE DATE / TIME: 202301210311
ISSUE DATE / TIME: 202301210731
ISSUE DATE / TIME: 202301211047
Unit Type and Rh: 7300
Unit Type and Rh: 7300
Unit Type and Rh: 7300

## 2021-12-17 LAB — TYPE AND SCREEN
ABO/RH(D): B POS
Antibody Screen: NEGATIVE
Unit division: 0
Unit division: 0
Unit division: 0

## 2021-12-17 LAB — BASIC METABOLIC PANEL
Anion gap: 10 (ref 5–15)
BUN: 8 mg/dL (ref 6–20)
CO2: 20 mmol/L — ABNORMAL LOW (ref 22–32)
Calcium: 8.2 mg/dL — ABNORMAL LOW (ref 8.9–10.3)
Chloride: 103 mmol/L (ref 98–111)
Creatinine, Ser: 1.02 mg/dL — ABNORMAL HIGH (ref 0.44–1.00)
GFR, Estimated: >60 mL/min (ref >60)
Glucose, Bld: 119 mg/dL — ABNORMAL HIGH (ref 70–99)
Potassium: 3.1 mmol/L — ABNORMAL LOW (ref 3.5–5.1)
Sodium: 133 mmol/L — ABNORMAL LOW (ref 135–145)

## 2021-12-17 LAB — D-DIMER, QUANTITATIVE: D-Dimer, Quant: 1.32 ug/mL-FEU — ABNORMAL HIGH (ref 0.00–0.50)

## 2021-12-17 LAB — CBC WITH DIFFERENTIAL/PLATELET
HCT: 28.2 % — ABNORMAL LOW (ref 36.0–46.0)
Hemoglobin: 9.6 g/dL — ABNORMAL LOW (ref 12.0–15.0)
MCH: 31.7 pg (ref 26.0–34.0)
MCHC: 34 g/dL (ref 30.0–36.0)
MCV: 93.1 fL (ref 80.0–100.0)
Platelets: 16 10*3/uL — CL (ref 150–400)
RBC: 3.03 MIL/uL — ABNORMAL LOW (ref 3.87–5.11)
RDW: 16 % — ABNORMAL HIGH (ref 11.5–15.5)
WBC: 0.3 10*3/uL — CL (ref 4.0–10.5)
nRBC: 0 % (ref 0.0–0.2)

## 2021-12-17 LAB — BPAM PLATELET PHERESIS
Blood Product Expiration Date: 202301212359
Blood Product Expiration Date: 202301232359
ISSUE DATE / TIME: 202301210104
ISSUE DATE / TIME: 202301220847
Unit Type and Rh: 6200
Unit Type and Rh: 6200

## 2021-12-17 LAB — CBC
HCT: 23.5 % — ABNORMAL LOW (ref 36.0–46.0)
Hemoglobin: 8.2 g/dL — ABNORMAL LOW (ref 12.0–15.0)
MCH: 32 pg (ref 26.0–34.0)
MCHC: 34.9 g/dL (ref 30.0–36.0)
MCV: 91.8 fL (ref 80.0–100.0)
Platelets: 8 10*3/uL — CL (ref 150–400)
RBC: 2.56 MIL/uL — ABNORMAL LOW (ref 3.87–5.11)
RDW: 15.4 % (ref 11.5–15.5)
WBC: 0.3 10*3/uL — CL (ref 4.0–10.5)
nRBC: 0 % (ref 0.0–0.2)

## 2021-12-17 LAB — COMPREHENSIVE METABOLIC PANEL
ALT: 12 U/L (ref 0–44)
AST: 11 U/L — ABNORMAL LOW (ref 15–41)
Albumin: 2.8 g/dL — ABNORMAL LOW (ref 3.5–5.0)
Alkaline Phosphatase: 59 U/L (ref 38–126)
Anion gap: 8 (ref 5–15)
BUN: 8 mg/dL (ref 6–20)
CO2: 20 mmol/L — ABNORMAL LOW (ref 22–32)
Calcium: 8 mg/dL — ABNORMAL LOW (ref 8.9–10.3)
Chloride: 103 mmol/L (ref 98–111)
Creatinine, Ser: 0.79 mg/dL (ref 0.44–1.00)
GFR, Estimated: >60 mL/min (ref >60)
Glucose, Bld: 127 mg/dL — ABNORMAL HIGH (ref 70–99)
Potassium: 3.7 mmol/L (ref 3.5–5.1)
Sodium: 131 mmol/L — ABNORMAL LOW (ref 135–145)
Total Bilirubin: 1 mg/dL (ref 0.3–1.2)
Total Protein: 6.7 g/dL (ref 6.5–8.1)

## 2021-12-17 LAB — MAGNESIUM: Magnesium: 1.5 mg/dL — ABNORMAL LOW (ref 1.7–2.4)

## 2021-12-17 LAB — PREPARE PLATELET PHERESIS
Unit division: 0
Unit division: 0

## 2021-12-17 LAB — PHOSPHORUS: Phosphorus: 1.5 mg/dL — ABNORMAL LOW (ref 2.5–4.6)

## 2021-12-17 MED ORDER — POTASSIUM CHLORIDE 10 MEQ/100ML IV SOLN
10.0000 meq | INTRAVENOUS | Status: AC
  Administered 2021-12-17 (×3): 10 meq via INTRAVENOUS
  Filled 2021-12-17 (×3): qty 100

## 2021-12-17 MED ORDER — SODIUM CHLORIDE 0.9 % IV SOLN: 2.0000 g | Freq: Three times a day (TID) | INTRAVENOUS | Status: DC

## 2021-12-17 MED ORDER — LORAZEPAM 2 MG/ML IJ SOLN
0.5000 mg | Freq: Four times a day (QID) | INTRAMUSCULAR | Status: DC | PRN
  Administered 2021-12-17 – 2021-12-19 (×5): 0.5 mg via INTRAVENOUS
  Filled 2021-12-17 (×5): qty 1

## 2021-12-17 MED ORDER — VANCOMYCIN HCL 1000 MG/200ML IV SOLN
1000.0000 mg | Freq: Two times a day (BID) | INTRAVENOUS | Status: DC
  Administered 2021-12-18 – 2021-12-19 (×3): 1000 mg via INTRAVENOUS
  Filled 2021-12-17 (×3): qty 200

## 2021-12-17 MED ORDER — POTASSIUM PHOSPHATES 15 MMOLE/5ML IV SOLN
30.0000 mmol | Freq: Once | INTRAVENOUS | Status: AC
  Administered 2021-12-17: 30 mmol via INTRAVENOUS
  Filled 2021-12-17: qty 10

## 2021-12-17 MED ORDER — POTASSIUM CHLORIDE CRYS ER 20 MEQ PO TBCR
30.0000 meq | EXTENDED_RELEASE_TABLET | ORAL | Status: AC
  Administered 2021-12-17 (×2): 30 meq via ORAL
  Filled 2021-12-17 (×2): qty 1

## 2021-12-17 MED ORDER — SODIUM CHLORIDE 0.9 % IV SOLN
2.0000 g | Freq: Three times a day (TID) | INTRAVENOUS | Status: AC
  Administered 2021-12-17 – 2021-12-24 (×21): 2 g via INTRAVENOUS
  Filled 2021-12-17 (×21): qty 2

## 2021-12-17 MED ORDER — SODIUM CHLORIDE 0.9 % IV SOLN: INTRAVENOUS | Status: DC | PRN

## 2021-12-17 MED ORDER — VANCOMYCIN HCL 1500 MG/300ML IV SOLN
1500.0000 mg | Freq: Once | INTRAVENOUS | Status: AC
Start: 2021-12-17 — End: 2021-12-17
  Administered 2021-12-17: 1500 mg via INTRAVENOUS
  Filled 2021-12-17: qty 300

## 2021-12-17 MED ORDER — MAGNESIUM SULFATE 4 GM/100ML IV SOLN
4.0000 g | Freq: Once | INTRAVENOUS | Status: AC
  Administered 2021-12-17: 4 g via INTRAVENOUS
  Filled 2021-12-17: qty 100

## 2021-12-17 MED ORDER — METOPROLOL TARTRATE 5 MG/5ML IV SOLN
5.0000 mg | Freq: Once | INTRAVENOUS | Status: AC
  Administered 2021-12-17: 5 mg via INTRAVENOUS
  Filled 2021-12-17: qty 5

## 2021-12-17 MED ORDER — CHLORHEXIDINE GLUCONATE CLOTH 2 % EX PADS
6.0000 | MEDICATED_PAD | Freq: Every day | CUTANEOUS | Status: DC
  Administered 2021-12-17 – 2021-12-26 (×8): 6 via TOPICAL

## 2021-12-17 MED ORDER — ACETAMINOPHEN 650 MG RE SUPP: 325.0000 mg | RECTAL | Status: DC | PRN

## 2021-12-17 NOTE — Significant Event (Signed)
Rapid Response Event Note   Reason for Call : Respiratory Distress, Tachycardia Notified by beside RN and [place] charge RN that patient had increased HR into the 150s, and increased RR, and work of breathing. Asked for the staff to obtain an EKG. Upon arrival, EKG conducted, and it showed Sinus Tachycardia. Patient was alert and oriented, but having difficulty breathing.   Initial Focused Assessment:  Neuro: Alert and oriented x 4, moves extremities but weak Cardiac: HR 140s-150s, ST confirmed by EKG  Pulmonary: O2 Sats 94-95%, [place] 7L  Interventions:  Notified MD [place] -MD Ordered 5mg  of IV metoprolol -CXR -Transfer to [place] of Care:  Transfer to [place]     Event Summary:   MD Notified: MD [place] notified by beside RN  Call Time: [place] Time: 2233 End Time: [place], RN

## 2021-12-17 NOTE — Progress Notes (Signed)
Pharmacy Antibiotic Note  [name] is a 51 y.o. female admitted on 12/14/2021 with febrile neutropenia  Pharmacy has been consulted for vancomycin dosing.  Plan: Vancomycin 1500 mg IV x 1 then vancomycin 1000 mg IV q12 for est AUC 539.5 using SCr 0.8, Vd 0.72 Cefepime 2 gm q8 per MD F/u renal function, WBC, temp, culture data Vancomycin levels as needed  Height: 5\' 8"  (172.7 cm) Weight: 68.8 kg (151 lb 10.8 oz) IBW/kg (Calculated) : 63.9  Temp (24hrs), Avg:99.7 F (37.6 C), Min:98.4 F (36.9 C), Max:101.1 F (38.4 C)  Recent Labs  Lab 12/14/21 1621 12/14/21 1626 12/14/21 1626 12/14/21 1826 12/14/21 2347 12/15/21 0307 12/16/21 0304 12/16/21 2154 12/17/21 0321 12/17/21 1701  WBC  --  0.3*   < >  --   --  0.2* 0.2* 0.2* 0.3* 0.3*  CREATININE  --  1.64*  --   --   --  1.23* 0.94  --  1.02* 0.79  LATICACIDVEN 2.9*  --   --  2.1* 0.9 1.6  --   --   --   --    < > = values in this interval not displayed.    Estimated Creatinine Clearance: 84.9 mL/min (by C-G formula based on SCr of 0.79 mg/dL).    Allergies  Allergen Reactions   Naproxen Nausea Only   Bactrim [Sulfamethoxazole-Trimethoprim] Nausea Only    Thank you for allowing pharmacy to be a part of this patients care.  Eudelia Bunch, Pharm.D 12/17/2021 6:11 PM

## 2021-12-17 NOTE — TOC Initial Note (Signed)
Transition of Care [facility]) - Initial/Assessment Note   Patient Details  Name: [name] MRN: [id number] Date of Birth: [birth year]  Transition [facility]) CM/SW Contact:    Sherie Don, LCSW Phone Number: 12/17/2021, 1:04 PM  Clinical Narrative: Readmission checklist completed due to high readmission score. CSW spoke with patient to complete assessment. Per patient, she lives with SO. Patient is able to afford her monthly medications. Patient is independent with ADLs at baseline and does not use any DME at home. Family provides transportation to medical appointments. TOC to follow for possible discharge needs.  Expected Discharge Plan: Home/Self Care Barriers to Discharge: Continued Medical Work up  Patient Goals and CMS Choice Patient states their goals for this hospitalization and ongoing recovery are:: Return home Choice offered to / list presented to : NA  Expected Discharge Plan and Services Expected Discharge Plan: Home/Self Care In-house Referral: Clinical Social Work Post Acute Care Choice: NA Living arrangements for the past 2 months: Single Family Home            DME Arranged: N/A DME Agency: NA  Prior Living Arrangements/Services Living arrangements for the past 2 months: Single Family Home Lives with:: Relatives Patient language and need for interpreter reviewed:: Yes Do you feel safe going back to the place where you live?: Yes      Need for Family Participation in Patient Care: No (Comment) Care giver support system in place?: Yes (comment) Criminal Activity/Legal Involvement Pertinent to Current Situation/Hospitalization: No - Comment as needed  Activities of Daily Living Home Assistive Devices/Equipment: Eyeglasses (reading glasses) ADL Screening (condition at time of admission) Patient's cognitive ability adequate to safely complete daily activities?: Yes Is the patient deaf or have difficulty hearing?: No Does the patient have difficulty seeing, even  when wearing glasses/contacts?: No Does the patient have difficulty concentrating, remembering, or making decisions?: No Patient able to express need for assistance with ADLs?: Yes Does the patient have difficulty dressing or bathing?: No Independently performs ADLs?: No Communication: Independent Dressing (OT): Independent Grooming: Independent Feeding: Independent Bathing: Independent Toileting: Needs assistance Is this a change from baseline?: Pre-admission baseline In/Out Bed: Needs assistance Is this a change from baseline?: Pre-admission baseline Walks in Home: Needs assistance Is this a change from baseline?: Pre-admission baseline Does the patient have difficulty walking or climbing stairs?: Yes Weakness of Legs: Both Weakness of Arms/Hands: Both  Emotional Assessment Attitude/Demeanor/Rapport: Engaged Affect (typically observed): Accepting Orientation: : Oriented to Self, Oriented to Place, Oriented to  Time, Oriented to Situation Alcohol / Substance Use: Tobacco Use Psych Involvement: No (comment)  Admission diagnosis:  Hypokalemia [E87.6] Hypomagnesemia [E83.42] Chemotherapy induced nausea and vomiting [R11.2, T45.1X5A] Pancytopenia (HCC) [D61.818] Nausea and vomiting [R11.2] Patient Active Problem List   Diagnosis Date Noted   Nausea and vomiting 12/14/2021   Chest pain 12/14/2021   Lactic acidosis 12/14/2021   Hypomagnesemia 12/14/2021   Hypokalemia 12/14/2021   Hyponatremia 12/14/2021   Hyperglycemia 12/14/2021   Leukopenia due to antineoplastic chemotherapy ([place]) 12/14/2021   Pancytopenia ([place]) 12/14/2021   Normocytic anemia 12/14/2021   AKI (acute kidney injury) ([place]) 12/14/2021   Generalized weakness 12/14/2021   Dehydration 11/13/2021   Encounter for antineoplastic chemotherapy 09/28/2021   Malignant neoplasm of right upper lobe of lung ([place]) 09/05/2021   Status post lumbar microdiscectomy 06/24/2018   Left lumbar radiculitis 04/25/2017   Lumbar  degenerative disc disease 04/25/2017   PCP:  [name], PA-C Pharmacy:   [facility] [id number] [place], [place] -  [place] [address] [place] [address] Phone: [phone] Fax: [phone]  Readmission Risk Interventions Readmission Risk Prevention Plan 12/17/2021  Transportation Screening Complete  HRI or Home Care Consult Complete  Social Work Consult for [place] Planning/Counseling Complete  Palliative Care Screening Not Applicable  Medication Review Press photographer) Complete  Some recent data might be hidden

## 2021-12-17 NOTE — Plan of Care (Signed)
  Problem: Education: Goal: Knowledge of General Education information will improve Description: Including pain rating scale, medication(s)/side effects and non-pharmacologic comfort measures Outcome: Progressing   Problem: Activity: Goal: Risk for activity intolerance will decrease Outcome: Progressing   Problem: Pain Managment: Goal: General experience of comfort will improve Outcome: Progressing   

## 2021-12-17 NOTE — Progress Notes (Addendum)
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  Currently on chemotherapy, last infusion on 12/06/2021.  Her nausea and vomiting has progressed and she has been unable to keep her medications down.  Patient denied fever, no chills, no headache, no diarrhea..  In the ED, temperature 98.4 F, HR 145, RR 18, BP 104/30, SPO2 100% on room air.  Sodium 133, potassium 3.7, chloride 102, CO2 16, glucose 145, BUN 22, creatinine 1.64, AST 17, ALT 13, total bilirubin 0.9.  WBC 0.3, hemoglobin 7.0, platelets 6.  High sensitivity troponin 3, <2.  Lactic acid 2.9.  Chest x-ray with no active cardiopulmonary disease process, similar small right suprahilar opacity.  CT angiogram chest with no evidence of pulmonary embolism, improving mediastinal/right hilar mass/adenopathy.  Oncology was consulted.  TRH consulted for further evaluation and management of intractable nausea/vomiting, pancytopenia.   Assessment & Plan:   Principal Problem:   Nausea and vomiting Active Problems:   Dehydration   Chest pain   Lactic acidosis   Hypomagnesemia   Hypokalemia   Hyponatremia   Hyperglycemia   Leukopenia due to antineoplastic chemotherapy (HCC)   Pancytopenia (HCC)   Normocytic anemia   AKI (acute kidney injury) ([place])   Generalized weakness  Addendum: Neutropenic fever Left lower lobe pneumonia, immunocompromise state Called to bedside for further evaluation following rapid response with increased work  of breathing, tachypnea, tachycardia.  Primary RN and rapid response RN present at bedside.  EKG with sinus tachycardia.  Not hypoxic but placed on supplemental oxygen for comfort.  Patient appears anxious.  Patient now febrile with temperature 101.1 in the setting of neutropenia.  Chest x-ray with what appears developing left lower lobe infiltrate.  Transferred to stepdown unit. --Blood cultures x2, urinalysis with culture, sputum culture, urine Legionella/strep pneumo antigen ordered --MRSA PCR ordered --Start vancomycin and cefepime --Tylenol PR if unable to tolerate oral --Follow CBC daily --Repeat chest x-ray in a.m. --Close monitoring in SDU, telemetry  Intractable nausea/vomiting Lactic acidosis: Resolved Patient presenting to ED with progressive nausea and vomiting, and inability to tolerate oral intake or her home medications.  Elevated lactic acid of 2.9 admission, likely secondary to dehydration. --Full liquid diet, will advance as tolerates --NS at 100 mL/h --Compazine 10 mg IV q6h PRN --Zofran 4 mg IV every 6 hours as needed refractory nausea/vomiting  Pancytopenia High presentation, WBC count 0.3, hemoglobin 7.0, platelets 6.  Transfused 1 unit platelets with improvement to 31.  Hemoglobin down to 4.8.  Etiology likely secondary to active chemotherapy. --Oncology following; appreciate assistance: Pending --WBC 0.3>0.2>0.2>0.3 --Hgb 7.0>4.8>10.4>9.0 (s/p 2u pRBC 1/21) --Plt 6>31>9>18>16 (s/p 1u Plt 1/20, 1/22) --Granix 300 mcg [place] daily (start 1/21) --CBC w/ diff daily  Hypokalemia Hypomagnesemia Hypophosphatemia Potassium 3.1, magnesium 1.5 and phosphorus 1.5 today; will continue repletion. --Follow electrolytes daily  Hyponatremia Sodium 133 on admission, likely secondary to dehydration from nausea/vomiting and inability to tolerate oral intake. --NS at 100 mL/h --BMP daily  Acute renal failure: resolved Creatinine 1.64 admission, likely secondary to prerenal  azotemia in the setting of dehydration and intractable  nausea/vomiting as above. --Cr 1.64>0.94>1.02 --Continue IV fluid hydration with NS at 100 mL/h --Avoid nephrotoxins, renal dose all medications --BMP in a.m.  Small cell lung cancer with right hilar/suprahilar mass with occlusion of the right upper lobe bronchus right paratracheal/infrahilar/subcarinal lymphadenopathy Follows with medical oncology outpatient, Dr. Julien Nordmann.  Last chemotherapy infusion 12/06/2021.  Now with pancytopenia likely related to chemotherapy. --Oncology following  Cancer associated pain: --OxyContin 20 mg p.o. every 12 hours --Oxycodone 5 mg p.o. q4h PRN moderate pain -- Dilaudid 0.5 mg IV q4h PRN severe breakthrough pain  COPD Not oxygen dependent at baseline. --Albuterol neb every 2 hours as needed shortness of breath/wheezing  Tobacco use disorder --Counseled on need for complete cessation --Nicotine patch    DVT prophylaxis: SCDs Start: 12/14/21 2118   Code Status: Full Code Family Communication: Updated patient's mother who is present at bedside this morning  Disposition Plan:  Level of care: Med-Surg Status is: Inpatient  Remains inpatient appropriate because: Awaiting oncology consultation, transfusing PRBCs, replacing electrolytes; anticipate discharge home in 3-4 days.      Consultants:  Oncology, Dr. Chryl Heck  Procedures:  None  Antimicrobials:  None   Subjective: Patient seen examined at bedside, lying in bed.  Mother present.  Was able to eat a little more of her liquid diet yesterday.  Continues with some mild nausea.  WBC count/ANC remains low, electrolytes also being replaced.  Continues on Granix.  No other specific complaints or concerns at this time.  Denies headache, no diarrhea, no shortness of breath, no abdominal pain, no fever/chills/night sweats, no paresthesias.  No acute concerns overnight per nursing staff.  Objective: Vitals:   12/16/21 2224 12/17/21 0141  12/17/21 0544 12/17/21 0617  BP: 126/74 121/86 127/76   Pulse: (!) 124 (!) 125 (!) 146 (!) 120  Resp: 19 16 16    Temp: (!) 100.7 F (38.2 C) 98.9 F (37.2 C) 98.4 F (36.9 C)   TempSrc: Oral Oral Oral   SpO2: 90% 91% 92%   Weight:      Height:        Intake/Output Summary (Last 24 hours) at 12/17/2021 1053 Last data filed at 12/17/2021 0600 Gross per 24 hour  Intake 3712.5 ml  Output --  Net 3712.5 ml   Filed Weights   12/15/21 0117  Weight: 68.8 kg    Examination:  General exam: Appears calm and comfortable chronically ill-appearing, appears older than stated age Respiratory system: Clear to auscultation. Respiratory effort normal.  On room air Cardiovascular system: S1 & S2 heard, RRR. No JVD, murmurs, rubs, gallops or clicks. No pedal edema. Gastrointestinal system: Abdomen is nondistended, soft and nontender. No organomegaly or masses felt. Normal bowel sounds heard. Central nervous system: Alert and oriented. No focal neurological deficits. Extremities: Symmetric 5 x 5 power. Skin: No rashes, lesions or ulcers Psychiatry: Judgement and insight appear normal. Mood & affect appropriate.     Data Reviewed: I have personally reviewed following labs and imaging studies  CBC: Recent Labs  Lab 12/14/21 1626 12/15/21 0307 12/15/21 1648 12/16/21 0304 12/16/21 2154 12/17/21 0321  WBC 0.3* 0.2*  --  0.2* 0.2* 0.3*  NEUTROABS 0.0*  --   --  0.0*  --   --   HGB 7.0* 4.8* 10.4* 9.0* 9.0* 9.6*  HCT 19.5* 14.1* 29.1* 24.8* 25.1* 28.2*  MCV 95.6 98.6  --  89.5 89.6 93.1  PLT 6* 31*  --  9* 18* 16*   Basic Metabolic Panel: Recent Labs  Lab 12/14/21 1626  12/15/21 0307 12/16/21 0304 12/17/21 0321  NA 133* 132* 131* 133*  K 2.7* 2.9* 2.7* 3.1*  CL 102 104 105 103  CO2 16* 19* 18* 20*  GLUCOSE 145* 116* 124* 119*  BUN 22* 16 12 8   CREATININE 1.64* 1.23* 0.94 1.02*  CALCIUM 8.3* 8.0* 7.7* 8.2*  MG 1.1* 1.7 1.0* 1.5*  PHOS  --  2.0*  --  1.5*   GFR: Estimated  Creatinine Clearance: 66.6 mL/min (A) (by C-G formula based on SCr of 1.02 mg/dL (H)). Liver Function Tests: Recent Labs  Lab 12/14/21 1626 12/15/21 0307  AST 17 14*  ALT 13 11  ALKPHOS 76 62  BILITOT 0.9 0.9  PROT 7.4 6.0*  ALBUMIN 3.5 2.9*   No results for input(s): LIPASE, AMYLASE in the last 168 hours. No results for input(s): AMMONIA in the last 168 hours. Coagulation Profile: Recent Labs  Lab 12/14/21 1626  INR 0.9   Cardiac Enzymes: No results for input(s): CKTOTAL, CKMB, CKMBINDEX, TROPONINI in the last 168 hours. BNP (last 3 results) No results for input(s): PROBNP in the last 8760 hours. HbA1C: No results for input(s): HGBA1C in the last 72 hours. CBG: No results for input(s): GLUCAP in the last 168 hours. Lipid Profile: No results for input(s): CHOL, HDL, LDLCALC, TRIG, CHOLHDL, LDLDIRECT in the last 72 hours. Thyroid Function Tests: No results for input(s): TSH, T4TOTAL, FREET4, T3FREE, THYROIDAB in the last 72 hours. Anemia Panel: No results for input(s): VITAMINB12, FOLATE, FERRITIN, TIBC, IRON, RETICCTPCT in the last 72 hours. Sepsis Labs: Recent Labs  Lab 12/14/21 1621 12/14/21 1826 12/14/21 2347 12/15/21 0307  LATICACIDVEN 2.9* 2.1* 0.9 1.6    Recent Results (from the past 240 hour(s))  Culture, blood (routine x 2)     Status: None (Preliminary result)   Collection Time: 12/14/21  4:26 PM   Specimen: BLOOD RIGHT HAND  Result Value Ref Range Status   Specimen Description   Final    BLOOD RIGHT HAND Performed at [place] [address] [place], [place] [address]    Special Requests   Final    BOTTLES DRAWN AEROBIC AND ANAEROBIC Blood Culture results may not be optimal due to an inadequate volume of blood received in culture bottles Performed at [place] [address] [place], [place] [address]    Culture   Final    NO GROWTH 3 DAYS Performed at [facility], [place] [address],  [place], [place] [address]    Report Status PENDING  Incomplete  Culture, blood (routine x 2)     Status: None (Preliminary result)   Collection Time: 12/14/21  4:31 PM   Specimen: Left Antecubital; Blood  Result Value Ref Range Status   Specimen Description   Final    LEFT ANTECUBITAL Performed at [place] [address], [place], [place] [address]    Special Requests   Final    BOTTLES DRAWN AEROBIC AND ANAEROBIC Blood Culture adequate volume Performed at [facility] [address] [place], [place] [address]    Culture   Final    NO GROWTH 3 DAYS Performed at [facility], [place] [address] [place], [place] [address]    Report Status PENDING  Incomplete  Resp Panel by RT-PCR (Flu A&B, Covid) Nasopharyngeal Swab     Status: None   Collection Time: 12/14/21  4:38 PM   Specimen: Nasopharyngeal Swab; Nasopharyngeal(NP) swabs in vial transport medium  Result  Value Ref Range Status   SARS Coronavirus 2 by RT PCR NEGATIVE NEGATIVE Final    Comment: (NOTE) SARS-CoV-2 target nucleic acids are NOT DETECTED.  The SARS-CoV-2 RNA is generally detectable in upper respiratory specimens during the acute phase of infection. The lowest concentration of SARS-CoV-2 viral copies this assay can detect is 138 copies/mL. A negative result does not preclude SARS-Cov-2 infection and should not be used as the sole basis for treatment or other patient management decisions. A negative result may occur with  improper specimen collection/handling, submission of specimen other than nasopharyngeal swab, presence of viral mutation(s) within the areas targeted by this assay, and inadequate number of viral copies(<138 copies/mL). A negative result must be combined with clinical observations, patient history, and epidemiological information. The expected result is Negative.  Fact Sheet for Patients:  EntrepreneurPulse.com.au  Fact Sheet for  Healthcare Providers:  IncredibleEmployment.be  This test is no t yet approved or cleared by the [place] FDA and  has been authorized for detection and/or diagnosis of SARS-CoV-2 by FDA under an Emergency Use Authorization (EUA). This EUA will remain  in effect (meaning this test can be used) for the duration of the COVID-19 declaration under Section 564(b)(1) of the Act, 21 U.S.C.section 360bbb-3(b)(1), unless the authorization is terminated  or revoked sooner.       Influenza A by PCR NEGATIVE NEGATIVE Final   Influenza B by PCR NEGATIVE NEGATIVE Final    Comment: (NOTE) The Xpert Xpress SARS-CoV-2/FLU/RSV plus assay is intended as an aid in the diagnosis of influenza from Nasopharyngeal swab specimens and should not be used as a sole basis for treatment. Nasal washings and aspirates are unacceptable for Xpert Xpress SARS-CoV-2/FLU/RSV testing.  Fact Sheet for Patients: EntrepreneurPulse.com.au  Fact Sheet for Healthcare Providers: IncredibleEmployment.be  This test is not yet approved or cleared by the [place] FDA and has been authorized for detection and/or diagnosis of SARS-CoV-2 by FDA under an Emergency Use Authorization (EUA). This EUA will remain in effect (meaning this test can be used) for the duration of the COVID-19 declaration under Section 564(b)(1) of the Act, 21 U.S.C. section 360bbb-3(b)(1), unless the authorization is terminated or revoked.  Performed at [facility], [place] [address] [place], [place] [address]          Radiology Studies: No results found.      Scheduled Meds:  sodium chloride   Intravenous Once   dextromethorphan-guaiFENesin  1 tablet Oral BID   feeding supplement  1 Container Oral TID BM   multivitamin with minerals  1 tablet Oral Daily   nicotine  21 mg Transdermal Daily   oxyCODONE  20 mg Oral Q12H   potassium chloride  30 mEq Oral Q3H    Tbo-Filgrastim  300 mcg Subcutaneous Daily   Continuous Infusions:  sodium chloride 10 mL/hr at 12/17/21 0703   0.9 % NaCl with KCl 20 mEq / L 75 mL/hr at 12/17/21 0259   potassium PHOSPHATE IVPB (in mmol)       LOS: 3 days    Time spent: 49 minutes spent on chart review, discussion with nursing staff, consultants, updating family and interview/physical exam; more than 50% of that time was spent in counseling and/or coordination of care.    Vinay Ertl J [place] DO [facility] Available via Epic secure chat 7am-7pm After these hours, please refer to coverage provider listed on [email or web address] 12/17/2021, 10:53 AM

## 2021-12-18 ENCOUNTER — Inpatient Hospital Stay: Payer: Medicaid Other

## 2021-12-18 ENCOUNTER — Inpatient Hospital Stay (HOSPITAL_COMMUNITY): Payer: Medicaid Other

## 2021-12-18 ENCOUNTER — Inpatient Hospital Stay: Payer: Self-pay

## 2021-12-18 DIAGNOSIS — J189 Pneumonia, unspecified organism: Secondary | ICD-10-CM

## 2021-12-18 DIAGNOSIS — J9621 Acute and chronic respiratory failure with hypoxia: Secondary | ICD-10-CM | POA: Insufficient documentation

## 2021-12-18 DIAGNOSIS — D709 Neutropenia, unspecified: Secondary | ICD-10-CM | POA: Diagnosis not present

## 2021-12-18 DIAGNOSIS — C801 Malignant (primary) neoplasm, unspecified: Secondary | ICD-10-CM | POA: Diagnosis not present

## 2021-12-18 DIAGNOSIS — R0781 Pleurodynia: Secondary | ICD-10-CM | POA: Diagnosis not present

## 2021-12-18 DIAGNOSIS — K208 Other esophagitis without bleeding: Secondary | ICD-10-CM | POA: Diagnosis present

## 2021-12-18 DIAGNOSIS — R112 Nausea with vomiting, unspecified: Secondary | ICD-10-CM | POA: Diagnosis not present

## 2021-12-18 DIAGNOSIS — Z7189 Other specified counseling: Secondary | ICD-10-CM

## 2021-12-18 DIAGNOSIS — J690 Pneumonitis due to inhalation of food and vomit: Secondary | ICD-10-CM | POA: Diagnosis present

## 2021-12-18 DIAGNOSIS — D61818 Other pancytopenia: Secondary | ICD-10-CM | POA: Diagnosis not present

## 2021-12-18 DIAGNOSIS — N179 Acute kidney failure, unspecified: Secondary | ICD-10-CM | POA: Diagnosis not present

## 2021-12-18 DIAGNOSIS — J9600 Acute respiratory failure, unspecified whether with hypoxia or hypercapnia: Secondary | ICD-10-CM | POA: Diagnosis present

## 2021-12-18 DIAGNOSIS — T66XXXA Radiation sickness, unspecified, initial encounter: Secondary | ICD-10-CM | POA: Diagnosis present

## 2021-12-18 DIAGNOSIS — Z515 Encounter for palliative care: Secondary | ICD-10-CM

## 2021-12-18 LAB — URINALYSIS, ROUTINE W REFLEX MICROSCOPIC
Bilirubin Urine: NEGATIVE
Glucose, UA: NEGATIVE mg/dL
Ketones, ur: 5 mg/dL — AB
Leukocytes,Ua: NEGATIVE
Nitrite: POSITIVE — AB
Protein, ur: 30 mg/dL — AB
Specific Gravity, Urine: 1.013 (ref 1.005–1.030)
pH: 5 (ref 5.0–8.0)

## 2021-12-18 LAB — CBC WITH DIFFERENTIAL/PLATELET
Abs Immature Granulocytes: 0 10*3/uL (ref 0.00–0.07)
Abs Immature Granulocytes: 0.05 10*3/uL (ref 0.00–0.07)
Basophils Absolute: 0 10*3/uL (ref 0.0–0.1)
Basophils Absolute: 0 10*3/uL (ref 0.0–0.1)
Basophils Relative: 0 %
Basophils Relative: 2 %
Eosinophils Absolute: 0 10*3/uL (ref 0.0–0.5)
Eosinophils Absolute: 0 10*3/uL (ref 0.0–0.5)
Eosinophils Relative: 9 %
Eosinophils Relative: 4 %
HCT: 24.8 % — ABNORMAL LOW (ref 36.0–46.0)
HCT: 24.5 % — ABNORMAL LOW (ref 36.0–46.0)
Hemoglobin: 9 g/dL — ABNORMAL LOW (ref 12.0–15.0)
Hemoglobin: 8.6 g/dL — ABNORMAL LOW (ref 12.0–15.0)
Immature Granulocytes: 0 %
Immature Granulocytes: 6 %
Lymphocytes Relative: 79 %
Lymphocytes Relative: 38 %
Lymphs Abs: 0.2 10*3/uL — ABNORMAL LOW (ref 0.7–4.0)
Lymphs Abs: 0.3 10*3/uL — ABNORMAL LOW (ref 0.7–4.0)
MCH: 32.5 pg (ref 26.0–34.0)
MCH: 32.5 pg (ref 26.0–34.0)
MCHC: 36.3 g/dL — ABNORMAL HIGH (ref 30.0–36.0)
MCHC: 35.1 g/dL (ref 30.0–36.0)
MCV: 89.5 fL (ref 80.0–100.0)
MCV: 92.5 fL (ref 80.0–100.0)
Monocytes Absolute: 0 10*3/uL — ABNORMAL LOW (ref 0.1–1.0)
Monocytes Absolute: 0.2 10*3/uL (ref 0.1–1.0)
Monocytes Relative: 8 %
Monocytes Relative: 26 %
Neutro Abs: 0 10*3/uL — CL (ref 1.7–7.7)
Neutro Abs: 0.2 10*3/uL — CL (ref 1.7–7.7)
Neutrophils Relative %: 4 %
Neutrophils Relative %: 24 %
Platelets: 9 10*3/uL — CL (ref 150–400)
Platelets: 6 10*3/uL — CL (ref 150–400)
RBC: 2.77 MIL/uL — ABNORMAL LOW (ref 3.87–5.11)
RBC: 2.65 MIL/uL — ABNORMAL LOW (ref 3.87–5.11)
RDW: 15.9 % — ABNORMAL HIGH (ref 11.5–15.5)
RDW: 15.5 % (ref 11.5–15.5)
WBC: 0.2 10*3/uL — CL (ref 4.0–10.5)
WBC: 0.8 10*3/uL — CL (ref 4.0–10.5)
nRBC: 0 % (ref 0.0–0.2)
nRBC: 0 % (ref 0.0–0.2)

## 2021-12-18 LAB — BASIC METABOLIC PANEL
Anion gap: 8 (ref 5–15)
Anion gap: 10 (ref 5–15)
BUN: 10 mg/dL (ref 6–20)
BUN: 12 mg/dL (ref 6–20)
CO2: 19 mmol/L — ABNORMAL LOW (ref 22–32)
CO2: 21 mmol/L — ABNORMAL LOW (ref 22–32)
Calcium: 8.3 mg/dL — ABNORMAL LOW (ref 8.9–10.3)
Calcium: 8.4 mg/dL — ABNORMAL LOW (ref 8.9–10.3)
Chloride: 107 mmol/L (ref 98–111)
Chloride: 104 mmol/L (ref 98–111)
Creatinine, Ser: 0.84 mg/dL (ref 0.44–1.00)
Creatinine, Ser: 0.69 mg/dL (ref 0.44–1.00)
GFR, Estimated: >60 mL/min (ref >60)
GFR, Estimated: >60 mL/min (ref >60)
Glucose, Bld: 113 mg/dL — ABNORMAL HIGH (ref 70–99)
Glucose, Bld: 136 mg/dL — ABNORMAL HIGH (ref 70–99)
Potassium: 4.2 mmol/L (ref 3.5–5.1)
Potassium: 3.7 mmol/L (ref 3.5–5.1)
Sodium: 134 mmol/L — ABNORMAL LOW (ref 135–145)
Sodium: 135 mmol/L (ref 135–145)

## 2021-12-18 LAB — CBC
HCT: 23.9 % — ABNORMAL LOW (ref 36.0–46.0)
Hemoglobin: 7.9 g/dL — ABNORMAL LOW (ref 12.0–15.0)
MCH: 31.5 pg (ref 26.0–34.0)
MCHC: 33.1 g/dL (ref 30.0–36.0)
MCV: 95.2 fL (ref 80.0–100.0)
Platelets: 87 10*3/uL — ABNORMAL LOW (ref 150–400)
RBC: 2.51 MIL/uL — ABNORMAL LOW (ref 3.87–5.11)
RDW: 15.5 % (ref 11.5–15.5)
WBC: 1.5 10*3/uL — ABNORMAL LOW (ref 4.0–10.5)
nRBC: 0 % (ref 0.0–0.2)

## 2021-12-18 LAB — MAGNESIUM: Magnesium: 1.5 mg/dL — ABNORMAL LOW (ref 1.7–2.4)

## 2021-12-18 LAB — STREP PNEUMONIAE URINARY ANTIGEN: Strep Pneumo Urinary Antigen: NEGATIVE

## 2021-12-18 LAB — PHOSPHORUS: Phosphorus: 2.4 mg/dL — ABNORMAL LOW (ref 2.5–4.6)

## 2021-12-18 MED ORDER — METOPROLOL TARTRATE 5 MG/5ML IV SOLN
5.0000 mg | Freq: Once | INTRAVENOUS | Status: AC
  Administered 2021-12-18: 08:00:00 5 mg via INTRAVENOUS

## 2021-12-18 MED ORDER — SODIUM CHLORIDE 0.9% IV SOLUTION: Freq: Once | INTRAVENOUS | Status: AC

## 2021-12-18 MED ORDER — FUROSEMIDE 10 MG/ML IJ SOLN
40.0000 mg | Freq: Once | INTRAMUSCULAR | Status: AC
  Administered 2021-12-18: 09:00:00 40 mg via INTRAVENOUS
  Filled 2021-12-18: qty 4

## 2021-12-18 MED ORDER — METOPROLOL TARTRATE 5 MG/5ML IV SOLN
5.0000 mg | INTRAVENOUS | Status: AC
  Administered 2021-12-18: 18:00:00 5 mg via INTRAVENOUS
  Filled 2021-12-18: qty 5

## 2021-12-18 MED ORDER — HYDROMORPHONE HCL 1 MG/ML IJ SOLN
0.5000 mg | INTRAMUSCULAR | Status: DC | PRN
  Administered 2021-12-18 – 2021-12-24 (×37): 1 mg via INTRAVENOUS
  Filled 2021-12-18 (×37): qty 1

## 2021-12-18 MED ORDER — METOPROLOL TARTRATE 5 MG/5ML IV SOLN
INTRAVENOUS | Status: AC
  Filled 2021-12-18: qty 5

## 2021-12-18 MED ORDER — SODIUM CHLORIDE 0.9% FLUSH
10.0000 mL | Freq: Two times a day (BID) | INTRAVENOUS | Status: DC
  Administered 2021-12-18 – 2021-12-19 (×2): 10 mL
  Administered 2021-12-19: 22:00:00 30 mL
  Administered 2021-12-20: 10:00:00 20 mL
  Administered 2021-12-20: 23:00:00 10 mL
  Administered 2021-12-21: 10:00:00 20 mL
  Administered 2021-12-21 – 2021-12-23 (×4): 10 mL
  Administered 2021-12-23: 20 mL
  Administered 2021-12-24 – 2021-12-25 (×3): 10 mL

## 2021-12-18 MED ORDER — METOPROLOL TARTRATE 5 MG/5ML IV SOLN
5.0000 mg | Freq: Once | INTRAVENOUS | Status: AC
  Administered 2021-12-18: 04:00:00 5 mg via INTRAVENOUS

## 2021-12-18 MED ORDER — FAMOTIDINE IN NACL 20-0.9 MG/50ML-% IV SOLN
20.0000 mg | INTRAVENOUS | Status: DC
  Administered 2021-12-18 – 2021-12-27 (×9): 20 mg via INTRAVENOUS
  Filled 2021-12-18 (×10): qty 50

## 2021-12-18 MED ORDER — LABETALOL HCL 5 MG/ML IV SOLN
20.0000 mg | INTRAVENOUS | Status: DC | PRN
  Filled 2021-12-18: qty 4

## 2021-12-18 MED ORDER — METOPROLOL TARTRATE 5 MG/5ML IV SOLN
2.5000 mg | Freq: Three times a day (TID) | INTRAVENOUS | Status: DC
  Administered 2021-12-18 – 2021-12-20 (×6): 2.5 mg via INTRAVENOUS
  Filled 2021-12-18 (×7): qty 5

## 2021-12-18 MED ORDER — RACEPINEPHRINE HCL 2.25 % IN NEBU
INHALATION_SOLUTION | RESPIRATORY_TRACT | Status: AC
  Administered 2021-12-18: 09:00:00 0.5 mL
  Filled 2021-12-18: qty 0.5

## 2021-12-18 MED ORDER — SODIUM CHLORIDE 0.9% FLUSH: 10.0000 mL | INTRAVENOUS | Status: DC | PRN

## 2021-12-18 MED ORDER — PANTOPRAZOLE SODIUM 40 MG IV SOLR
40.0000 mg | Freq: Two times a day (BID) | INTRAVENOUS | Status: DC
  Administered 2021-12-18 – 2021-12-26 (×17): 40 mg via INTRAVENOUS
  Filled 2021-12-18 (×16): qty 40

## 2021-12-18 MED ORDER — LEVALBUTEROL HCL 0.63 MG/3ML IN NEBU
0.6300 mg | INHALATION_SOLUTION | Freq: Four times a day (QID) | RESPIRATORY_TRACT | Status: DC | PRN
  Administered 2021-12-18 – 2021-12-21 (×5): 0.63 mg via RESPIRATORY_TRACT
  Filled 2021-12-18 (×6): qty 3

## 2021-12-18 MED ORDER — MAGNESIUM SULFATE 4 GM/100ML IV SOLN
4.0000 g | Freq: Once | INTRAVENOUS | Status: AC
  Administered 2021-12-18: 09:00:00 4 g via INTRAVENOUS
  Filled 2021-12-18: qty 100

## 2021-12-18 NOTE — Progress Notes (Signed)
Peripherally Inserted Central Catheter Placement  The IV Nurse has discussed with the patient and/or persons authorized to consent for the patient, the purpose of this procedure and the potential benefits and risks involved with this procedure.  The benefits include less needle sticks, lab draws from the catheter, and the patient may be discharged home with the catheter. Risks include, but not limited to, infection, bleeding, blood clot (thrombus formation), and puncture of an artery; nerve damage and irregular heartbeat and possibility to perform a PICC exchange if needed/ordered by physician.  Alternatives to this procedure were also discussed.  Bard Power PICC patient education guide, fact sheet on infection prevention and patient information card has been provided to patient /or left at bedside.    PICC Placement Documentation  PICC Double Lumen 12/18/21 PICC Right Brachial 45 cm 0 cm (Active)  Indication for Insertion or Continuance of Line Poor Vasculature-patient has had multiple peripheral attempts or PIVs lasting less than 24 hours 12/18/21 1500  Exposed Catheter (cm) 0 cm 12/18/21 1500  Site Assessment Clean;Dry;Intact 12/18/21 1500  Lumen #1 Status Flushed;Saline locked;Blood return noted 12/18/21 1500  Lumen #2 Status Blood return noted;Saline locked;Flushed 12/18/21 1500  Dressing Type Transparent;Securing device 12/18/21 1500  Dressing Status Clean;Dry;Intact 12/18/21 1500  Antimicrobial disc in place? Yes 12/18/21 1500  Safety Lock Not Applicable 53/74/82 7078  Dressing Change Due 12/25/21 12/18/21 1500       [name] 12/18/2021, 3:02 PM

## 2021-12-18 NOTE — Consult Note (Signed)
NAME:  [name], MRN:  [id number], DOB:  November 28, 1970, LOS: 4 ADMISSION DATE:  12/14/2021, CONSULTATION DATE:  1/24 REFERRING MD:  [place]  , CHIEF COMPLAINT: acute respiratory failure   History of Present Illness:  51 yof w/ hx per below. Last Chemo w/ Cisplatin and Etoposide 1/10,1/11 and 1/12. She is s/p completion of 3 rounds XRT (~2 wks prior).   Brought in by EMS 1/20 w/ cc: shortness of breath and chest pain. Completed chemo about 8d prior w/ post-chemo course c/b nausea, vomiting and poor po intake. 3d PTA began to notice Right sided CP and increasing shortness of breath. No fevers, chills, HA, sick exposures.  ED workup: pancytopenic.  CXR w/out infiltrates, CT chest negative for Pulmonary emboli, admitted w/ working dx of N/V dehydration w/ associated AKI as well as Pancytopenia, also mild elevated lactic acid   See hospital course below.  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 Completed chemo about 8d prior w/ post-chemo course c/b nausea, vomiting and poor po intake. 3d PTA began to notice Right sided CP and increasing shortness of breath. No fevers, chills, HA, sick exposures.  ED workup: pancytopenic, CXR w/out  infiltrates, CT chest negative for Pulmonary emboli, admitted w/ working dx of N/V dehydration w/ associated AKI as well as Pancytopenia. Treated w/ Compazine, IVFs and cl liq diet. Initial WBC 0.3, hgb 7, PLT 6; received both PRBC and PLT transfusion  1/21 lactic acidosis resolved. Onc consulted for Pancytopenia. Electrolytes replaced. Getting pain meds. Started on Filgrastim by ONC. W/ instructions to  discontinue if ANC count greater than 5000 or ANC count greater than 1000 on 2 consecutive days. Hgb down to 4.8 (got total 2 u PRBC)  1/22 diet advanced to full liq. Got another unit PLTs. Had some hemoptysis w/ cough  1/23 new fever, rapid response called for increased WOB and tachycardia. Pt anxious. PCXR w/ possible LL airspace disease. Vanc and cefepime started.  1/24 remained tachycardic, still having CP and shoulder pain, still having nausea but no emesis. Developed progressive respiratory distress w/ loud audible expiratory wheezing. Pulse oximetry did not drop significantly however O2 was rapidly titrated up w/ little improvement in WOB. PCCM called emergently to bedside. Marked Upper airway expiratory wheezing noted w/ significant reduction during phonation efforts and when reassured. Placed On BIPAP> stat CXR obtained. Got IV lasix. PLTs down to 6K   Interim History / Subjective:  Feels better on BIPAP   Objective   Blood pressure (Abnormal) 146/104, pulse (Abnormal) 116, temperature 98.9 F (37.2 C), temperature source Axillary, resp. rate 20, height 5\' 8"  (1.727 m), weight 68.8 kg, last menstrual period 04/24/2018, SpO2 100 %.    FiO2 (%):  [100 %] 100 %   Intake/Output Summary (Last 24 hours) at 12/18/2021 1029 Last  data filed at 12/18/2021 0754 Gross per 24 hour  Intake 2210.81 ml  Output 600 ml  Net 1610.81 ml   Filed Weights   12/15/21 0117  Weight: 68.8 kg    Examination: General: 51 year old female now On BIPAP w/ improved WOB  HENT: marked upper airway expiratory wheeze  that decreases significantly when attempting phonation  Lungs: decreased t/o WOB improved w/ BIPAP  Cardiovascular: RRR w/out MRG Abdomen: soft not tender  Extremities: warm trace LE edema  Neuro: awake and oriented  GU: voids   Resolved Hospital Problem list   Lactic acidosis  AKI Dehydration   Assessment & Plan:  Principal Problem:   Nausea and vomiting Active Problems:   Acute respiratory failure (HCC)   Aspiration pneumonia (HCC)   Radiation-induced esophagitis   Dehydration   Chest pain   Hypomagnesemia   Hypokalemia   Hyponatremia   Hyperglycemia   Leukopenia due to antineoplastic chemotherapy (HCC)   Pancytopenia (HCC)   Normocytic anemia   AKI (acute kidney injury) ([place])   Generalized weakness   Neutropenic fever (HCC)  Acute respiratory failure. Multifactorial. Does have what appears to be LLL aspiration PNA but has significant upper airway burden as evidenced by upper airway expiratory wheezing that varies in intensity w/ phonation or anxiety reduction raising concern for element of vocal cord dysfxn vs simply radiation induced upper airway inflammation (as also has radiation induced esophagitis)  Plan Supplemental oxygen  Reflux precautions (HOB > 30 degrees) Aggressive reflux Rx (see below) Day 2 vanc and cefepime BIPAP PRN Vocal rest Anxiety reduction  Pulse ox  AM CXR (Think we can avoid intubation for now)   Radiation induced esophagitis ->think this may also be contributing to her chest pain  Plan PPI BID and H2 blockade Reflux precautions  Antiemetics (on-going N/V will only make this worseO   Anxiety (situational)->plays a role in symptom burden here Plan Supportive care  Pancytopenia 2/2 chemo PLTs down again to 6K. Hgb holding Plan  Cont serial cbcs Transfuse for hgb < 7, or if PLTs < 5 K or develops active bleeding  Granix to continue for now but discontinue if ANC count greater than 5000 or ANC count greater than 1000 on 2 consecutive  days.  Fluid and Electrolyte imbalance: hypomagnesemia  Plan Replace and recheck as indicated  Metastatic small cell lung cancer w/ cancer related pain  Plan F/u per onc  Treat pain   Goals of care I discussed life support/CPR ICU care with the patient and then later her mother. At this point she would want full aggressive care. I did tell her mother and daughter that on-going discussion of goals of care are very important w/ people w/ advanced malignancy. Especially as pt indicating that she would NOT want chemo in future to her mom although I'm not sure if this is her final decision.   Best Practice (right click and "Reselect all SmartList Selections" daily)   Diet/type: clear liquids DVT prophylaxis: SCD GI prophylaxis: H2B and PPI Lines: N/A Foley:  N/A Code Status:  full code Last date of multidisciplinary goals of care discussion [discussed 1/24 full code ]  Labs   CBC: Recent Labs  Lab 12/14/21 1626 12/15/21 0307 12/16/21 0304 12/16/21 2154 12/17/21 0321 12/17/21 1701 12/18/21 0237  WBC 0.3*   < > 0.2* 0.2* 0.3* 0.3* 0.8*  NEUTROABS 0.0*  --  0.0*  --   --   --  0.2*  HGB 7.0*   < > 9.0*  9.0* 9.6* 8.2* 8.6*  HCT 19.5*   < > 24.8* 25.1* 28.2* 23.5* 24.5*  MCV 95.6   < > 89.5 89.6 93.1 91.8 92.5  PLT 6*   < > 9* 18* 16* 8* 6*   < > = values in this interval not displayed.    Basic Metabolic Panel: Recent Labs  Lab 12/14/21 1626 12/15/21 0307 12/16/21 0304 12/17/21 0321 12/17/21 1701 12/18/21 0237  NA 133* 132* 131* 133* 131* 134*  K 2.7* 2.9* 2.7* 3.1* 3.7 4.2  CL 102 104 105 103 103 107  CO2 16* 19* 18* 20* 20* 19*  GLUCOSE 145* 116* 124* 119* 127* 113*  BUN 22* 16 12 8 8 10   CREATININE 1.64* 1.23* 0.94 1.02* 0.79 0.84  CALCIUM 8.3* 8.0* 7.7* 8.2* 8.0* 8.3*  MG 1.1* 1.7 1.0* 1.5*  --  1.5*  PHOS  --  2.0*  --  1.5*  --  2.4*   GFR: Estimated Creatinine Clearance: 80.8 mL/min (by C-G formula based on SCr of 0.84 mg/dL). Recent Labs  Lab  12/14/21 1621 12/14/21 1626 12/14/21 1826 12/14/21 2347 12/15/21 0307 12/16/21 0304 12/16/21 2154 12/17/21 0321 12/17/21 1701 12/18/21 0237  WBC  --    < >  --   --  0.2*   < > 0.2* 0.3* 0.3* 0.8*  LATICACIDVEN 2.9*  --  2.1* 0.9 1.6  --   --   --   --   --    < > = values in this interval not displayed.    Liver Function Tests: Recent Labs  Lab 12/14/21 1626 12/15/21 0307 12/17/21 1701  AST 17 14* 11*  ALT 13 11 12   ALKPHOS 76 62 59  BILITOT 0.9 0.9 1.0  PROT 7.4 6.0* 6.7  ALBUMIN 3.5 2.9* 2.8*   No results for input(s): LIPASE, AMYLASE in the last 168 hours. No results for input(s): AMMONIA in the last 168 hours.  ABG    Component Value Date/Time   HCO3 16.0 (L) 01/14/2008 1202   TCO2 17 01/14/2008 1202   ACIDBASEDEF 7.0 (H) 01/14/2008 1202     Coagulation Profile: Recent Labs  Lab 12/14/21 1626  INR 0.9    Cardiac Enzymes: No results for input(s): CKTOTAL, CKMB, CKMBINDEX, TROPONINI in the last 168 hours.  HbA1C: No results found for: HGBA1C  CBG: No results for input(s): GLUCAP in the last 168 hours.  Review of Systems:   Review of Systems  Constitutional:  Positive for fever.  HENT:  Positive for congestion and sore throat.   Eyes: Negative.   Respiratory:  Positive for cough, shortness of breath and wheezing.   Cardiovascular:  Positive for chest pain.  Gastrointestinal:  Positive for nausea.  Genitourinary: Negative.   Musculoskeletal: Negative.   Neurological: Negative.   Endo/Heme/Allergies: Negative.   Psychiatric/Behavioral: Negative.      Past Medical History:  She,  has a past medical history of Ankle sprain, Anxiety, Asthma, COPD (chronic obstructive pulmonary disease) ([place]), Depression, History of radiation therapy, Irregular heart rate, Lung cancer ([place]), and Renal disorder.   Surgical History:   Past Surgical History:  Procedure Laterality Date   APPENDECTOMY     BRONCHIAL BIOPSY  09/21/2021   Procedure: BRONCHIAL  BIOPSIES;  Surgeon: Collene Gobble, MD;  Location: [facility] ENDOSCOPY;  Service: Pulmonary;;   BRONCHIAL BRUSHINGS  09/21/2021   Procedure: BRONCHIAL BRUSHINGS;  Surgeon: Collene Gobble, MD;  Location: [facility] ENDOSCOPY;  Service: Pulmonary;;   BRONCHIAL NEEDLE ASPIRATION BIOPSY  09/21/2021  Procedure: BRONCHIAL NEEDLE ASPIRATION BIOPSIES;  Surgeon: Collene Gobble, MD;  Location: [facility] ENDOSCOPY;  Service: Pulmonary;;   LUMBAR LAMINECTOMY N/A 05/13/2018   Procedure: LEFT LUMBAR FOUR-FIVE MICRODISCECTOMY;  Surgeon: Marybelle Killings, MD;  Location: [place];  Service: Orthopedics;  Laterality: N/A;   TUBAL LIGATION     TUBAL LIGATION  1998   VIDEO BRONCHOSCOPY WITH ENDOBRONCHIAL ULTRASOUND N/A 09/21/2021   Procedure: VIDEO BRONCHOSCOPY WITH ENDOBRONCHIAL ULTRASOUND;  Surgeon: Collene Gobble, MD;  Location: [facility] ENDOSCOPY;  Service: Pulmonary;  Laterality: N/A;     Social History:   reports that she has been smoking cigarettes. She has a 15.00 pack-year smoking history. She has never used smokeless tobacco. She reports current alcohol use. She reports that she does not currently use drugs after having used the following drugs: Marijuana.   Family History:  Her family history includes Heart disease in her father; Hypertension in her father and mother.   Allergies Allergies  Allergen Reactions   Naproxen Nausea Only   Bactrim [Sulfamethoxazole-Trimethoprim] Nausea Only     Home Medications  Prior to Admission medications   Medication Sig Start Date End Date Taking? Authorizing Provider  albuterol (PROVENTIL HFA;VENTOLIN HFA) 108 (90 Base) MCG/ACT inhaler Inhale 2 puffs into the lungs every 6 (six) hours as needed for wheezing or shortness of breath. 03/13/17  Yes Hairston, Mandesia R, FNP  diphenhydramine-acetaminophen (TYLENOL PM) 25-500 MG TABS tablet Take 3 tablets by mouth at bedtime as needed (sleep).   Yes [provider]  magnesium oxide (MAG-OX) 400 (240 Mg) MG tablet Take 1 tablet (400 mg  total) by mouth 3 (three) times daily. 10/30/21  Yes Curt Bears, MD  ondansetron (ZOFRAN ODT) 8 MG disintegrating tablet Dissolve 1 tablet (8 mg total) by mouth every 8 (eight) hours as needed for nausea or vomiting. 10/11/21  Yes Heilingoetter, Cassandra L, PA-C  oxyCODONE ER (XTAMPZA ER) 18 MG C12A Take 1 capsule by mouth 2 times daily. 11/06/21  Yes Gery Pray, MD  oxyCODONE-acetaminophen (PERCOCET/ROXICET) 5-325 MG tablet Take 1 tablet by mouth every 8 hours as needed for severe pain. 12/04/21  Yes Heilingoetter, Cassandra L, PA-C  potassium chloride 20 MEQ/15ML (10%) SOLN Take 15 mLs by mouth 2 times daily. 12/04/21  Yes Heilingoetter, Cassandra L, PA-C     Critical care time: 32 min   Erick Colace ACNP-BC [place] Pager # [phone] OR # [phone] if no answer

## 2021-12-18 NOTE — Progress Notes (Signed)
RT NOTE:  Pt placed on BiPAP per CCMD order, no complications noted while being placed. Pt states she feels comfortable on the BiPAP at this time.

## 2021-12-18 NOTE — Progress Notes (Signed)
Notified Gershon Cull, NP of HR elevated 140-160 ST. She is c/o pain 8/10 rt chest and shoulder and nausea continues . She refuses any PO meds due to nausea and unable to tolerate . See new orders. At bedside continuing to monitor.

## 2021-12-18 NOTE — Progress Notes (Signed)
PROGRESS NOTE    [name]  HYQ:[id number] DOB: [birth year] DOA: 12/14/2021 PCP: [name], PA-C    Brief Narrative:  [name] is a 51 year old female with past medical history significant for small cell lung cancer with right hilar/suprahilar mass with occlusion of the right upper lobe bronchus right paratracheal/infrahilar/subcarinal lymphadenopathy diagnosed October 2022 currently on chemotherapy and radiation, COPD, tobacco use disorder who presented to [facility] ED on 1/20 with nausea/vomiting, shortness of breath, right-sided chest pain.  Currently on chemotherapy, last infusion on 12/06/2021.  Her nausea and vomiting has progressed and she has been unable to keep her medications down.  Patient denied fever, no chills, no headache, no diarrhea..  In the ED, temperature 98.4 F, HR 145, RR 18, BP 104/30, SPO2 100% on room air.  Sodium 133, potassium 3.7, chloride 102, CO2 16, glucose 145, BUN 22, creatinine 1.64, AST 17, ALT 13, total bilirubin 0.9.  WBC 0.3, hemoglobin 7.0, platelets 6.  High sensitivity troponin 3, <2.  Lactic acid 2.9.  Chest x-ray with no active cardiopulmonary disease process, similar small right suprahilar opacity.  CT angiogram chest with no evidence of pulmonary embolism, improving mediastinal/right hilar mass/adenopathy.  Oncology was consulted.  TRH consulted for further evaluation and management of intractable nausea/vomiting, pancytopenia.   Assessment & Plan:   Principal Problem:   Nausea and vomiting Active Problems:   Dehydration   Chest pain   Lactic acidosis   Hypomagnesemia   Hypokalemia   Hyponatremia   Hyperglycemia   Leukopenia due to antineoplastic chemotherapy (HCC)   Pancytopenia (HCC)   Normocytic anemia   AKI (acute kidney injury) ([place])   Generalized weakness   Acute hypoxic respiratory failure with stridor, not POA Neutropenic fever Left lower lobe pneumonia, immunocompromise state Called to bedside for further  evaluation following rapid response with increased work of breathing, tachypnea, tachycardia.  Primary RN and rapid response RN present at bedside.  EKG with sinus tachycardia.  Not hypoxic but placed on supplemental oxygen for comfort.  Patient appears anxious.  Patient now febrile with temperature 101.1 in the setting of neutropenia.  Chest x-ray with what appears developing left lower lobe infiltrate.  Strep pneumo urinary antigen negative. --PCCM following, appreciate assistance --Status post racemic epinephrine neb this morning --BiPAP --Urine Legionella antigen: Pending --MRSA PCR: Pending --Blood cultures x2 1/20: No growth x4 days --Blood culture 1/23: No growth less than 12 hours --vancomycin and cefepime --Follow CBC daily  Intractable nausea/vomiting Lactic acidosis: Resolved Patient presenting to ED with progressive nausea and vomiting, and inability to tolerate oral intake or her home medications.  Elevated lactic acid of 2.9 admission, likely secondary to dehydration. --Full liquid diet, will advance as tolerates --NS with 20 mEq KCl at 75 mL/h --Compazine 10 mg IV q6h PRN --Zofran 4 mg IV every 6 hours as needed refractory nausea/vomiting  Pancytopenia High presentation, WBC count 0.3, hemoglobin 7.0, platelets 6.  Transfused 1 unit platelets with improvement to 31.  Hemoglobin down to 4.8.  Etiology likely secondary to active chemotherapy. --Oncology following; appreciate assistance: Pending --WBC 0.3>0.2>0.2>0.3>0.8 --Hgb 7.0>4.8>10.4>9.0>8.6 (s/p 2u pRBC 1/21) --Plt 6>31>9>18>16>6 (s/p 1u Plt 1/20, 1u 1/22, 3u 1/24) --Granix 300 mcg  daily (start 1/21) --Transfuse 3 unit platelets today --CBC w/ diff daily  Hypokalemia Hypomagnesemia Hypophosphatemia Potassium 4.2, magnesium 1.5 and phosphorus 2.4 today; will continue repletion. --Follow electrolytes daily  Hyponatremia Sodium 133 on admission, likely secondary to dehydration from nausea/vomiting and inability  to tolerate oral intake. --NS at 100  mL/h --BMP daily  Acute renal failure: resolved Creatinine 1.64 admission, likely secondary to prerenal azotemia in the setting of dehydration and intractable nausea/vomiting as above. --Cr 1.64>0.94>1.02>0.84 --Continue IV fluid hydration with NS at 100 mL/h --Avoid nephrotoxins, renal dose all medications --BMP in a.m.  Small cell lung cancer with right hilar/suprahilar mass with occlusion of the right upper lobe bronchus right paratracheal/infrahilar/subcarinal lymphadenopathy Follows with medical oncology outpatient, Dr. Julien Nordmann.  Last chemotherapy infusion 12/06/2021.  Now with pancytopenia likely related to chemotherapy. --Oncology following  Cancer associated pain: --OxyContin 20 mg p.o. every 12 hours --Oxycodone 5 mg p.o. q4h PRN moderate pain -- Dilaudid 0.5 mg IV q4h PRN severe breakthrough pain  COPD Not oxygen dependent at baseline. --Albuterol neb every 2 hours as needed shortness of breath/wheezing  Tobacco use disorder --Counseled on need for complete cessation --Nicotine patch  Ethics: Patient contemplating stopping chemotherapy.  We will consult palliative care for assistance with goals of care and medical decision making.  Currently wishes for intubation and ventilatory support and CPR as of 12/18/2021.    DVT prophylaxis: SCDs Start: 12/14/21 2118   Code Status: Full Code Family Communication: No family present at bedside this morning  Disposition Plan:  Level of care: Stepdown Status is: Inpatient  Remains inpatient appropriate because: Requiring BiPAP, worsening respiratory failure, transfusing platelets today, replacing electrolytes       Consultants:  Oncology, Dr. Chryl Heck PCCM, Dr. Silas Flood Palliative care  Procedures:  BiPAP 1/24  Antimicrobials:  Vancomycin 1/23>> Cefepime 1/23>>   Subjective: Patient seen examined at bedside, lying in bed.  Appears to be in further respiratory distress with  stridor.  RN and RT at bedside, receiving racemic epinephrine neb treatment.  Discussed with PCCM for impending respiratory compromise and possible need for ventilatory support.  Patient states she would be okay with intubation and placed on the ventilator this morning.  PCCM seen at bedside, placed on BiPAP.  WC count/ANC remains low, on Granix.  Platelet count down to 6 today we will transfuse 3 unit platelets given intermittent hemoptysis.  Denies chest pain, no diarrhea, no abdominal pain.  No other acute concerns this morning per nursing staff other than her respiratory demise.  Objective: Vitals:   12/18/21 0800 12/18/21 0834 12/18/21 0846 12/18/21 0914  BP: (!) 146/104     Pulse: (!) 127  (!) 134 (!) 116  Resp: 17  20 20   Temp:      TempSrc:      SpO2: 97% 97% 93% 100%  Weight:      Height:        Intake/Output Summary (Last 24 hours) at 12/18/2021 9702 Last data filed at 12/18/2021 6378 Gross per 24 hour  Intake 2640.16 ml  Output 600 ml  Net 2040.16 ml   Filed Weights   12/15/21 0117  Weight: 68.8 kg    Examination:  General exam: Moderate respiratory distress with stridor, chronically ill-appearing, appears older than stated age Respiratory system: Coarse breath sounds bilaterally, slightly decreased left base, noted stridor, on 10 L high flow nasal cannula, receiving epinephrine neb Cardiovascular system: S1 & S2 heard, tachycardic, regular rhythm. No JVD, murmurs, rubs, gallops or clicks. No pedal edema. Gastrointestinal system: Abdomen is nondistended, soft and nontender. No organomegaly or masses felt. Normal bowel sounds heard. Central nervous system: Alert and oriented. No focal neurological deficits. Extremities: Symmetric 5 x 5 power. Skin: No rashes, lesions or ulcers Psychiatry: Judgement and insight appear normal.  Anxious mood.     Data  Reviewed: I have personally reviewed following labs and imaging studies  CBC: Recent Labs  Lab 12/14/21 1626  12/15/21 0307 12/16/21 0304 12/16/21 2154 12/17/21 0321 12/17/21 1701 12/18/21 0237  WBC 0.3*   < > 0.2* 0.2* 0.3* 0.3* 0.8*  NEUTROABS 0.0*  --  0.0*  --   --   --  0.2*  HGB 7.0*   < > 9.0* 9.0* 9.6* 8.2* 8.6*  HCT 19.5*   < > 24.8* 25.1* 28.2* 23.5* 24.5*  MCV 95.6   < > 89.5 89.6 93.1 91.8 92.5  PLT 6*   < > 9* 18* 16* 8* 6*   < > = values in this interval not displayed.   Basic Metabolic Panel: Recent Labs  Lab 12/14/21 1626 12/15/21 0307 12/16/21 0304 12/17/21 0321 12/17/21 1701 12/18/21 0237  NA 133* 132* 131* 133* 131* 134*  K 2.7* 2.9* 2.7* 3.1* 3.7 4.2  CL 102 104 105 103 103 107  CO2 16* 19* 18* 20* 20* 19*  GLUCOSE 145* 116* 124* 119* 127* 113*  BUN 22* 16 12 8 8 10   CREATININE 1.64* 1.23* 0.94 1.02* 0.79 0.84  CALCIUM 8.3* 8.0* 7.7* 8.2* 8.0* 8.3*  MG 1.1* 1.7 1.0* 1.5*  --  1.5*  PHOS  --  2.0*  --  1.5*  --  2.4*   GFR: Estimated Creatinine Clearance: 80.8 mL/min (by C-G formula based on SCr of 0.84 mg/dL). Liver Function Tests: Recent Labs  Lab 12/14/21 1626 12/15/21 0307 12/17/21 1701  AST 17 14* 11*  ALT 13 11 12   ALKPHOS 76 62 59  BILITOT 0.9 0.9 1.0  PROT 7.4 6.0* 6.7  ALBUMIN 3.5 2.9* 2.8*   No results for input(s): LIPASE, AMYLASE in the last 168 hours. No results for input(s): AMMONIA in the last 168 hours. Coagulation Profile: Recent Labs  Lab 12/14/21 1626  INR 0.9   Cardiac Enzymes: No results for input(s): CKTOTAL, CKMB, CKMBINDEX, TROPONINI in the last 168 hours. BNP (last 3 results) No results for input(s): PROBNP in the last 8760 hours. HbA1C: No results for input(s): HGBA1C in the last 72 hours. CBG: No results for input(s): GLUCAP in the last 168 hours. Lipid Profile: No results for input(s): CHOL, HDL, LDLCALC, TRIG, CHOLHDL, LDLDIRECT in the last 72 hours. Thyroid Function Tests: No results for input(s): TSH, T4TOTAL, FREET4, T3FREE, THYROIDAB in the last 72 hours. Anemia Panel: No results for input(s):  VITAMINB12, FOLATE, FERRITIN, TIBC, IRON, RETICCTPCT in the last 72 hours. Sepsis Labs: Recent Labs  Lab 12/14/21 1621 12/14/21 1826 12/14/21 2347 12/15/21 0307  LATICACIDVEN 2.9* 2.1* 0.9 1.6    Recent Results (from the past 240 hour(s))  Culture, blood (routine x 2)     Status: None (Preliminary result)   Collection Time: 12/14/21  4:26 PM   Specimen: BLOOD RIGHT HAND  Result Value Ref Range Status   Specimen Description   Final    BLOOD RIGHT HAND Performed at [facility] [address] [place], [place] [address]    Special Requests   Final    BOTTLES DRAWN AEROBIC AND ANAEROBIC Blood Culture results may not be optimal due to an inadequate volume of blood received in culture bottles Performed at [facility] [address], [place], [place] [address]    Culture   Final    NO GROWTH 4 DAYS Performed at [facility], [place] [address] [place], [place] [address]    Report Status PENDING  Incomplete  Culture, blood (routine x 2)     Status: None (Preliminary result)   Collection Time: 12/14/21  4:31 PM   Specimen: Left Antecubital; Blood  Result Value Ref Range Status   Specimen Description   Final    LEFT ANTECUBITAL Performed at [facility] [address] [place], [place] [address]    Special Requests   Final    BOTTLES DRAWN AEROBIC AND ANAEROBIC Blood Culture adequate volume Performed at [facility] [address], [place], [place] [address]    Culture   Final    NO GROWTH 4 DAYS Performed at [facility], [place] [address], [place], [place] [address]    Report Status PENDING  Incomplete  Resp Panel by RT-PCR (Flu A&B, Covid) Nasopharyngeal Swab     Status: None   Collection Time: 12/14/21  4:38 PM   Specimen: Nasopharyngeal Swab; Nasopharyngeal(NP) swabs in vial transport medium  Result Value Ref Range Status   SARS Coronavirus 2 by RT PCR NEGATIVE NEGATIVE Final     Comment: (NOTE) SARS-CoV-2 target nucleic acids are NOT DETECTED.  The SARS-CoV-2 RNA is generally detectable in upper respiratory specimens during the acute phase of infection. The lowest concentration of SARS-CoV-2 viral copies this assay can detect is 138 copies/mL. A negative result does not preclude SARS-Cov-2 infection and should not be used as the sole basis for treatment or other patient management decisions. A negative result may occur with  improper specimen collection/handling, submission of specimen other than nasopharyngeal swab, presence of viral mutation(s) within the areas targeted by this assay, and inadequate number of viral copies(<138 copies/mL). A negative result must be combined with clinical observations, patient history, and epidemiological information. The expected result is Negative.  Fact Sheet for Patients:  EntrepreneurPulse.com.au  Fact Sheet for Healthcare Providers:  IncredibleEmployment.be  This test is no t yet approved or cleared by the [place] FDA and  has been authorized for detection and/or diagnosis of SARS-CoV-2 by FDA under an Emergency Use Authorization (EUA). This EUA will remain  in effect (meaning this test can be used) for the duration of the COVID-19 declaration under Section 564(b)(1) of the Act, 21 U.S.C.section 360bbb-3(b)(1), unless the authorization is terminated  or revoked sooner.       Influenza A by PCR NEGATIVE NEGATIVE Final   Influenza B by PCR NEGATIVE NEGATIVE Final    Comment: (NOTE) The Xpert Xpress SARS-CoV-2/FLU/RSV plus assay is intended as an aid in the diagnosis of influenza from Nasopharyngeal swab specimens and should not be used as a sole basis for treatment. Nasal washings and aspirates are unacceptable for Xpert Xpress SARS-CoV-2/FLU/RSV testing.  Fact Sheet for Patients: EntrepreneurPulse.com.au  Fact Sheet for Healthcare  Providers: IncredibleEmployment.be  This test is not yet approved or cleared by the [place] FDA and has been authorized for detection and/or diagnosis of SARS-CoV-2 by FDA under an Emergency Use Authorization (EUA). This EUA will remain in effect (meaning this test can be used) for the duration of the COVID-19 declaration under Section 564(b)(1) of the Act, 21 U.S.C. section 360bbb-3(b)(1), unless the authorization is terminated or revoked.  Performed at [facility], [place] [address] [place], [place] [address]   Culture, blood (routine x 2)     Status: None (Preliminary result)   Collection Time: 12/17/21  6:32 PM   Specimen: BLOOD LEFT HAND  Result Value Ref Range Status   Specimen Description   Final    BLOOD LEFT HAND  Performed at [facility], [place] [address], [place], [place] [address]    Special Requests   Final    BOTTLES DRAWN AEROBIC ONLY Blood Culture results may not be optimal due to an inadequate volume of blood received in culture bottles Performed at [facility] [address] [place], [place] [address]    Culture   Final    NO GROWTH < 12 HOURS Performed at [facility] [address], [place], [place] [address]    Report Status PENDING  Incomplete  Culture, blood (routine x 2)     Status: None (Preliminary result)   Collection Time: 12/17/21  6:32 PM   Specimen: BLOOD LEFT FOREARM  Result Value Ref Range Status   Specimen Description   Final    BLOOD LEFT FOREARM Performed at [facility] [address] [place], [place] [address]    Special Requests   Final    BOTTLES DRAWN AEROBIC ONLY Blood Culture results may not be optimal due to an inadequate volume of blood received in culture bottles Performed at [facility] [address] [place], [place] [address]    Culture   Final    NO GROWTH < 12 HOURS Performed at [facility] [address], [place], [place] [address]    Report Status PENDING  Incomplete         Radiology Studies: DG CHEST PORT 1 VIEW  Result Date: 12/18/2021 CLINICAL DATA:  Shortness of breath EXAM: PORTABLE CHEST 1 VIEW COMPARISON:  12/17/2021 FINDINGS: Lungs are hyperinflated as can be seen with COPD. Hazy left lower lobe airspace disease which may reflect atelectasis versus pneumonia unchanged compared with the prior exam. No pleural effusion or pneumothorax. Heart and mediastinal contours are unremarkable. No acute osseous abnormality. IMPRESSION: Stable hazy left lower lobe airspace disease which may reflect atelectasis versus pneumonia. Electronically Signed   By: Kathreen Devoid M.D.   On: 12/18/2021 08:40   DG CHEST PORT 1 VIEW  Result Date: 12/17/2021 CLINICAL DATA:  Nausea and shortness of breath. EXAM: PORTABLE CHEST 1 VIEW COMPARISON:  Radiographs and chest CTA 3 days ago 12/14/2021, FINDINGS: Developing patchy opacity at the left lung base, new from prior exam. There is increase some bronchial thickening. Normal heart size with unchanged mediastinal contours. No significant pleural effusion or pneumothorax. IMPRESSION: 1. Developing patchy opacity at the left lung base over the last 3 days may be atelectasis or pneumonia. 2. Increased bronchial thickening. Electronically Signed   By: Keith Rake M.D.   On: 12/17/2021 17:35        Scheduled Meds:  sodium chloride   Intravenous Once   Chlorhexidine Gluconate Cloth  6 each Topical Daily   dextromethorphan-guaiFENesin  1 tablet Oral BID   feeding supplement  1 Container Oral TID BM   metoprolol tartrate       metoprolol tartrate  2.5 mg Intravenous Q8H   multivitamin with minerals  1 tablet Oral Daily   nicotine  21 mg Transdermal Daily   oxyCODONE  20 mg Oral Q12H   Tbo-Filgrastim  300 mcg Subcutaneous Daily   Continuous Infusions:  sodium chloride 10 mL/hr at 12/17/21 0703   0.9 % NaCl with KCl 20 mEq / L 75  mL/hr at 12/18/21 0617   ceFEPime (MAXIPIME) IV Stopped (12/18/21 0645)   magnesium sulfate bolus IVPB 4 g (12/18/21 0855)   vancomycin 1,000 mg (12/18/21 0900)     LOS:  4 days    Critical Care Time Upon my evaluation, this patient had a high probability of imminent or life-threatening deterioration due to neutropenic fevers secondary to left lower lobe pneumonia, respiratory failure with stridor now requiring BiPAP, which required my direct attention, intervention, and personal management.  I have personally provided 49 minutes of critical care time exclusive of my time spent on separately billable procedures.  Time includes review of laboratory data, radiology results, discussion with consultants, and monitoring for potential decompensation.      Tajuanna Burnett J [place] DO [facility] Available via Epic secure chat 7am-7pm After these hours, please refer to coverage provider listed on [email or web address] 12/18/2021, 9:52 AM

## 2021-12-18 NOTE — Progress Notes (Addendum)
° ° °  OVERNIGHT PROGRESS REPORT  Notified by RN for tachycardia related to pain as before. Patient describes her pain as an 8/10. She also has nausea without emesis. In light of the Nausea and inability to maintain PO meds. frequency of the IV pain medication is changed.   Also, Medication ordered as prior for tachycardia.    Gershon Cull MSNA MSN ACNPC-AG Acute Care Nurse Practitioner [place]

## 2021-12-18 NOTE — Progress Notes (Addendum)
At approximately 0800, patient complained that she "couldn't breathe." Patient coughing, complaining of R sided chest pain when coughing, patient tachypneic with RR in mid 40s, tachycardic in the 160s. O2 sats at 88%. This RN titrated O2 from 8L HFNC to 10L HFNC. Saturations improved to the mid 90s. RN notified MD. MD to bedside. Medication given for R sided chest pain (see MAR). Patient had continued work of breathing. MD consulted CCM. New orders placed. RN continues to monitor patient at bedside. Patient currently on BIPAP. Currently patient has decreased work of breathing, RR within normal limits at this time. Patient states she is "more comfortable" at this time.

## 2021-12-19 ENCOUNTER — Inpatient Hospital Stay (HOSPITAL_COMMUNITY): Payer: Medicaid Other

## 2021-12-19 DIAGNOSIS — D701 Agranulocytosis secondary to cancer chemotherapy: Secondary | ICD-10-CM

## 2021-12-19 DIAGNOSIS — R5081 Fever presenting with conditions classified elsewhere: Secondary | ICD-10-CM

## 2021-12-19 DIAGNOSIS — C3411 Malignant neoplasm of upper lobe, right bronchus or lung: Secondary | ICD-10-CM

## 2021-12-19 DIAGNOSIS — Z515 Encounter for palliative care: Secondary | ICD-10-CM | POA: Diagnosis not present

## 2021-12-19 DIAGNOSIS — E878 Other disorders of electrolyte and fluid balance, not elsewhere classified: Secondary | ICD-10-CM

## 2021-12-19 DIAGNOSIS — R000 Tachycardia, unspecified: Secondary | ICD-10-CM | POA: Insufficient documentation

## 2021-12-19 DIAGNOSIS — D709 Neutropenia, unspecified: Secondary | ICD-10-CM

## 2021-12-19 DIAGNOSIS — J9601 Acute respiratory failure with hypoxia: Secondary | ICD-10-CM | POA: Diagnosis not present

## 2021-12-19 DIAGNOSIS — G893 Neoplasm related pain (acute) (chronic): Secondary | ICD-10-CM | POA: Diagnosis not present

## 2021-12-19 DIAGNOSIS — T451X5A Adverse effect of antineoplastic and immunosuppressive drugs, initial encounter: Secondary | ICD-10-CM

## 2021-12-19 DIAGNOSIS — Z720 Tobacco use: Secondary | ICD-10-CM | POA: Diagnosis present

## 2021-12-19 DIAGNOSIS — R112 Nausea with vomiting, unspecified: Secondary | ICD-10-CM | POA: Diagnosis not present

## 2021-12-19 DIAGNOSIS — Z7189 Other specified counseling: Secondary | ICD-10-CM | POA: Diagnosis not present

## 2021-12-19 LAB — BASIC METABOLIC PANEL
Anion gap: 8 (ref 5–15)
BUN: 14 mg/dL (ref 6–20)
CO2: 26 mmol/L (ref 22–32)
Calcium: 8.5 mg/dL — ABNORMAL LOW (ref 8.9–10.3)
Chloride: 102 mmol/L (ref 98–111)
Creatinine, Ser: 0.86 mg/dL (ref 0.44–1.00)
GFR, Estimated: >60 mL/min (ref >60)
Glucose, Bld: 114 mg/dL — ABNORMAL HIGH (ref 70–99)
Potassium: 3.2 mmol/L — ABNORMAL LOW (ref 3.5–5.1)
Sodium: 136 mmol/L (ref 135–145)

## 2021-12-19 LAB — CBC WITH DIFFERENTIAL/PLATELET
Abs Immature Granulocytes: 0.1 10*3/uL — ABNORMAL HIGH (ref 0.00–0.07)
Basophils Absolute: 0 10*3/uL (ref 0.0–0.1)
Basophils Relative: 0 %
Eosinophils Absolute: 0 10*3/uL (ref 0.0–0.5)
Eosinophils Relative: 0 %
HCT: 21.4 % — ABNORMAL LOW (ref 36.0–46.0)
Hemoglobin: 7.3 g/dL — ABNORMAL LOW (ref 12.0–15.0)
Lymphocytes Relative: 31 %
Lymphs Abs: 0.3 10*3/uL — ABNORMAL LOW (ref 0.7–4.0)
MCH: 32.4 pg (ref 26.0–34.0)
MCHC: 34.1 g/dL (ref 30.0–36.0)
MCV: 95.1 fL (ref 80.0–100.0)
Metamyelocytes Relative: 1 %
Monocytes Absolute: 0.3 10*3/uL (ref 0.1–1.0)
Monocytes Relative: 26 %
Myelocytes: 6 %
Neutro Abs: 0.4 10*3/uL — CL (ref 1.7–7.7)
Neutrophils Relative %: 36 %
Platelets: 48 10*3/uL — ABNORMAL LOW (ref 150–400)
RBC: 2.25 MIL/uL — ABNORMAL LOW (ref 3.87–5.11)
RDW: 15.7 % — ABNORMAL HIGH (ref 11.5–15.5)
WBC: 1 10*3/uL — CL (ref 4.0–10.5)
nRBC: 0 % (ref 0.0–0.2)

## 2021-12-19 LAB — BPAM PLATELET PHERESIS
Blood Product Expiration Date: 202301252359
Blood Product Expiration Date: 202301252359
Blood Product Expiration Date: 202301262359
ISSUE DATE / TIME: 202301241049
ISSUE DATE / TIME: 202301241344
ISSUE DATE / TIME: 202301241630
Unit Type and Rh: 1700
Unit Type and Rh: 6200
Unit Type and Rh: 7300

## 2021-12-19 LAB — PREPARE PLATELET PHERESIS
Unit division: 0
Unit division: 0
Unit division: 0

## 2021-12-19 LAB — URINE CULTURE: Culture: <10000 — AB

## 2021-12-19 LAB — MRSA NEXT GEN BY PCR, NASAL: MRSA by PCR Next Gen: NOT DETECTED

## 2021-12-19 LAB — CULTURE, BLOOD (ROUTINE X 2)
Culture: NO GROWTH
Culture: NO GROWTH
Special Requests: ADEQUATE

## 2021-12-19 LAB — HEMOGLOBIN AND HEMATOCRIT, BLOOD
HCT: 24.4 % — ABNORMAL LOW (ref 36.0–46.0)
Hemoglobin: 8.4 g/dL — ABNORMAL LOW (ref 12.0–15.0)

## 2021-12-19 LAB — MAGNESIUM: Magnesium: 1.7 mg/dL (ref 1.7–2.4)

## 2021-12-19 LAB — BRAIN NATRIURETIC PEPTIDE: B Natriuretic Peptide: 368.7 pg/mL — ABNORMAL HIGH (ref 0.0–100.0)

## 2021-12-19 LAB — PHOSPHORUS: Phosphorus: 2.8 mg/dL (ref 2.5–4.6)

## 2021-12-19 MED ORDER — POTASSIUM CHLORIDE 10 MEQ/100ML IV SOLN
10.0000 meq | INTRAVENOUS | Status: AC
  Administered 2021-12-19 (×4): 10 meq via INTRAVENOUS
  Filled 2021-12-19 (×4): qty 100

## 2021-12-19 MED ORDER — POTASSIUM CHLORIDE CRYS ER 20 MEQ PO TBCR
40.0000 meq | EXTENDED_RELEASE_TABLET | ORAL | Status: DC
  Filled 2021-12-19 (×2): qty 2

## 2021-12-19 MED ORDER — PROMETHAZINE HCL 25 MG PO TABS
25.0000 mg | ORAL_TABLET | Freq: Four times a day (QID) | ORAL | Status: DC | PRN
  Administered 2021-12-20 – 2021-12-22 (×4): 25 mg via ORAL
  Filled 2021-12-19 (×6): qty 1

## 2021-12-19 MED ORDER — SODIUM CHLORIDE 0.9 % IV SOLN
6.2500 mg | Freq: Four times a day (QID) | INTRAVENOUS | Status: DC | PRN
  Administered 2021-12-19 – 2021-12-21 (×6): 6.25 mg via INTRAVENOUS
  Filled 2021-12-19 (×24): qty 0.25

## 2021-12-19 MED ORDER — ORAL CARE MOUTH RINSE
15.0000 mL | Freq: Two times a day (BID) | OROMUCOSAL | Status: DC
  Administered 2021-12-19 – 2021-12-25 (×14): 15 mL via OROMUCOSAL

## 2021-12-19 MED ORDER — CHLORHEXIDINE GLUCONATE 0.12 % MT SOLN
15.0000 mL | Freq: Two times a day (BID) | OROMUCOSAL | Status: DC
  Administered 2021-12-20 – 2021-12-27 (×14): 15 mL via OROMUCOSAL
  Filled 2021-12-19 (×14): qty 15

## 2021-12-19 MED ORDER — METHYLPREDNISOLONE SODIUM SUCC 40 MG IJ SOLR
40.0000 mg | Freq: Two times a day (BID) | INTRAMUSCULAR | Status: DC
  Administered 2021-12-19 – 2021-12-23 (×9): 40 mg via INTRAVENOUS
  Filled 2021-12-19 (×9): qty 1

## 2021-12-19 MED ORDER — FUROSEMIDE 10 MG/ML IJ SOLN
40.0000 mg | Freq: Once | INTRAMUSCULAR | Status: AC
  Administered 2021-12-19: 10:00:00 40 mg via INTRAVENOUS
  Filled 2021-12-19: qty 4

## 2021-12-19 NOTE — Assessment & Plan Note (Addendum)
Continue antiemetics Advancing diet.

## 2021-12-19 NOTE — Assessment & Plan Note (Addendum)
PPI H2 blocker sucralfate  relfux precautions

## 2021-12-19 NOTE — Consult Note (Signed)
NAME:  [name], MRN:  [id number], DOB:  [birth year], LOS: 5 ADMISSION DATE:  12/14/2021, CONSULTATION DATE:  1/24 REFERRING MD:  [place]  , CHIEF COMPLAINT: acute respiratory failure   History of Present Illness:  83 yof w/ hx per below. Last Chemo w/ Cisplatin and Etoposide 1/10,1/11 and 1/12. She is s/p completion of 3 rounds XRT (~2 wks prior).   Brought in by EMS 1/20 w/ cc: shortness of breath and chest pain. Completed chemo about 8d prior w/ post-chemo course c/b nausea, vomiting and poor po intake. 3d PTA began to notice Right sided CP and increasing shortness of breath. No fevers, chills, HA, sick exposures.  ED workup: pancytopenic.  CXR w/out infiltrates, CT chest negative for Pulmonary emboli, admitted w/ working dx of N/V dehydration w/ associated AKI as well as Pancytopenia, also mild elevated lactic acid   See hospital course below.  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 Completed chemo about 8d prior w/ post-chemo course c/b nausea, vomiting and poor po intake. 3d PTA began to notice Right sided CP and increasing shortness of breath. No fevers, chills, HA, sick exposures.  ED workup: pancytopenic, CXR w/out  infiltrates, CT chest negative for Pulmonary emboli, admitted w/ working dx of N/V dehydration w/ associated AKI as well as Pancytopenia. Treated w/ Compazine, IVFs and cl liq diet. Initial WBC 0.3, hgb 7, PLT 6; received both PRBC and PLT transfusion  1/21 lactic acidosis resolved. Onc consulted for Pancytopenia. Electrolytes replaced. Getting pain meds. Started on Filgrastim by ONC. W/ instructions to  discontinue if ANC count greater than 5000 or ANC count greater than 1000 on 2 consecutive days. Hgb down to 4.8 (got total 2 u PRBC)  1/22 diet advanced to full liq. Got another unit PLTs. Had some hemoptysis w/ cough  1/23 new fever, rapid response called for increased WOB and tachycardia. Pt anxious. PCXR w/ possible LL airspace disease. Vanc and cefepime started.  1/24 remained tachycardic, still having CP and shoulder pain, still having nausea but no emesis. Developed progressive respiratory distress w/ loud audible expiratory wheezing. Pulse oximetry did not drop significantly however O2 was rapidly titrated up w/ little improvement in WOB. PCCM called emergently to bedside. Marked Upper airway expiratory wheezing noted w/ significant reduction during phonation efforts and when reassured. Placed On BIPAP> stat CXR obtained. Got IV lasix. PLTs down to 6K Quickly able to come off BiPAP after reassurance, lasix, PRN anti-HTN.  Interim History / Subjective:  Seems better. Remains on 10L salter. Says she is tired, expresses desire to explore options of treatment different than current care.  Objective   Blood pressure 104/88, pulse 98, temperature 97.8 F (36.6 C), temperature source Axillary, resp. rate 17, height 5\' 8"  (1.727 m), weight 68.8 kg, last menstrual period 04/24/2018,  SpO2 100 %.    FiO2 (%):  [60 %-100 %] 60 %   Intake/Output Summary (Last 24 hours) at 12/19/2021 0758 Last data filed at 12/19/2021 0700 Gross per 24 hour  Intake 2015.75 ml  Output 1875 ml  Net 140.75 ml    Filed  Weights   12/15/21 0117  Weight: 68.8 kg    Examination: General: 51 year old female on salter w/ improved WOB  HENT: marked upper airway expiratory noise that decreases significantly when attempting phonation  Lungs: NWOB Cardiovascular: RRR w/out MRG Abdomen: soft not tender  Extremities: warm trace LE edema  Neuro: awake and oriented   Resolved Hospital Problem list   Lactic acidosis  AKI Dehydration   Assessment & Plan:  Principal Problem:   Nausea and vomiting Active Problems:   Dehydration   Chest pain   Hypomagnesemia   Hypokalemia   Hyponatremia   Hyperglycemia   Leukopenia due to antineoplastic chemotherapy (HCC)   Pancytopenia (HCC)   Normocytic anemia   AKI (acute kidney injury) ([place])   Generalized weakness   Neutropenic fever (HCC)   Acute respiratory failure (HCC)   Aspiration pneumonia (HCC)   Radiation-induced esophagitis  Acute respiratory failure. Multifactorial. Does have what appears to be LLL aspiration PNA but has significant upper airway burden as evidenced by upper airway expiratory wheezing that varies in intensity w/ phonation or anxiety reduction raising concern for element of vocal cord dysfxn vs simply radiation induced upper airway inflammation (as also has radiation induced esophagitis). CXR unchanged 1/25. Plan Supplemental oxygen  Reflux precautions (HOB > 30 degrees) Aggressive reflux Rx (see below) Day 3 vanc and cefepime, stop vancomycin as MRSA swab negative BIPAP PRN Vocal rest Anxiety reduction  Pulse ox   Radiation induced esophagitis ->think this may also be contributing to her chest pain  Plan PPI BID and H2 blockade Reflux precautions  Antiemetics (on-going N/V will only make this worseO   Anxiety (situational)->plays a role in symptom burden here Plan Supportive care  Pancytopenia 2/2 chemo Plts transfused 1/24 Plan  Cont serial cbcs Transfuse for hgb < 7, or if PLTs < 10 K or develops active bleeding  Granix  to continue for now but discontinue if ANC count greater than 5000 or ANC count greater than 1000 on 2 consecutive days.  Fluid and Electrolyte imbalance: hypomagnesemia  Plan Replace and recheck as indicated  Metastatic small cell lung cancer w/ cancer related pain  Plan F/u per onc  Treat pain  Palliative care consult  Goals of care Discussed life support/CPR ICU care with the patient and then later her mother on 1/24. At this point she would want full aggressive care. I did tell her mother and daughter that on-going discussion of goals of care are very important w/ people w/ advanced malignancy. Especially as pt indicating that she would NOT want chemo in future to her mom although I'm not sure if this is her final decision. Re-iterated her desire to stop on 1/25, palliative care consult ordered.  Best Practice (right click and "Reselect all SmartList Selections" daily)   Per Primary  Labs   CBC: Recent Labs  Lab 12/14/21 1626 12/15/21 0307 12/16/21 0304 12/16/21 2154 12/17/21 0321 12/17/21 1701 12/18/21 0237 12/18/21 2140 12/19/21 0535  WBC 0.3*   < > 0.2*   < > 0.3* 0.3* 0.8* 1.5* 1.0*  NEUTROABS 0.0*  --  0.0*  --   --   --  0.2*  --  0.4*  HGB 7.0*   < >  9.0*   < > 9.6* 8.2* 8.6* 7.9* 7.3*  HCT 19.5*   < > 24.8*   < > 28.2* 23.5* 24.5* 23.9* 21.4*  MCV 95.6   < > 89.5   < > 93.1 91.8 92.5 95.2 95.1  PLT 6*   < > 9*   < > 16* 8* 6* 87* 48*   < > = values in this interval not displayed.     Basic Metabolic Panel: Recent Labs  Lab 12/15/21 0307 12/16/21 0304 12/17/21 0321 12/17/21 1701 12/18/21 0237 12/18/21 1208 12/19/21 0535  NA 132* 131* 133* 131* 134* 135 136  K 2.9* 2.7* 3.1* 3.7 4.2 3.7 3.2*  CL 104 105 103 103 107 104 102  CO2 19* 18* 20* 20* 19* 21* 26  GLUCOSE 116* 124* 119* 127* 113* 136* 114*  BUN 16 12 8 8 10 12 14   CREATININE 1.23* 0.94 1.02* 0.79 0.84 0.69 0.86  CALCIUM 8.0* 7.7* 8.2* 8.0* 8.3* 8.4* 8.5*  MG 1.7 1.0* 1.5*  --  1.5*  --   1.7  PHOS 2.0*  --  1.5*  --  2.4*  --  2.8    GFR: Estimated Creatinine Clearance: 78.9 mL/min (by C-G formula based on SCr of 0.86 mg/dL). Recent Labs  Lab 12/14/21 1621 12/14/21 1626 12/14/21 1826 12/14/21 2347 12/15/21 0307 12/16/21 0304 12/17/21 1701 12/18/21 0237 12/18/21 2140 12/19/21 0535  WBC  --    < >  --   --  0.2*   < > 0.3* 0.8* 1.5* 1.0*  LATICACIDVEN 2.9*  --  2.1* 0.9 1.6  --   --   --   --   --    < > = values in this interval not displayed.     Liver Function Tests: Recent Labs  Lab 12/14/21 1626 12/15/21 0307 12/17/21 1701  AST 17 14* 11*  ALT 13 11 12   ALKPHOS 76 62 59  BILITOT 0.9 0.9 1.0  PROT 7.4 6.0* 6.7  ALBUMIN 3.5 2.9* 2.8*    No results for input(s): LIPASE, AMYLASE in the last 168 hours. No results for input(s): AMMONIA in the last 168 hours.  ABG    Component Value Date/Time   HCO3 16.0 (L) 01/14/2008 1202   TCO2 17 01/14/2008 1202   ACIDBASEDEF 7.0 (H) 01/14/2008 1202      Coagulation Profile: Recent Labs  Lab 12/14/21 1626  INR 0.9     Cardiac Enzymes: No results for input(s): CKTOTAL, CKMB, CKMBINDEX, TROPONINI in the last 168 hours.  HbA1C: No results found for: HGBA1C  CBG: No results for input(s): GLUCAP in the last 168 hours.  Review of Systems:   n/a   Past Medical History:  She,  has a past medical history of Ankle sprain, Anxiety, Asthma, COPD (chronic obstructive pulmonary disease) ([place]), Depression, History of radiation therapy, Irregular heart rate, Lung cancer ([place]), and Renal disorder.   Surgical History:   Past Surgical History:  Procedure Laterality Date   APPENDECTOMY     BRONCHIAL BIOPSY  09/21/2021   Procedure: BRONCHIAL BIOPSIES;  Surgeon: Collene Gobble, MD;  Location: [facility] ENDOSCOPY;  Service: Pulmonary;;   BRONCHIAL BRUSHINGS  09/21/2021   Procedure: BRONCHIAL BRUSHINGS;  Surgeon: Collene Gobble, MD;  Location: [facility] ENDOSCOPY;  Service: Pulmonary;;   BRONCHIAL NEEDLE ASPIRATION  BIOPSY  09/21/2021   Procedure: BRONCHIAL NEEDLE ASPIRATION BIOPSIES;  Surgeon: Collene Gobble, MD;  Location: [place] ENDOSCOPY;  Service: Pulmonary;;   LUMBAR LAMINECTOMY N/A 05/13/2018  Procedure: LEFT LUMBAR FOUR-FIVE MICRODISCECTOMY;  Surgeon: Marybelle Killings, MD;  Location: [place];  Service: Orthopedics;  Laterality: N/A;   TUBAL LIGATION     TUBAL LIGATION  1998   VIDEO BRONCHOSCOPY WITH ENDOBRONCHIAL ULTRASOUND N/A 09/21/2021   Procedure: VIDEO BRONCHOSCOPY WITH ENDOBRONCHIAL ULTRASOUND;  Surgeon: Collene Gobble, MD;  Location: [place] ENDOSCOPY;  Service: Pulmonary;  Laterality: N/A;     Social History:   reports that she has been smoking cigarettes. She has a 15.00 pack-year smoking history. She has never used smokeless tobacco. She reports current alcohol use. She reports that she does not currently use drugs after having used the following drugs: Marijuana.   Family History:  Her family history includes Heart disease in her father; Hypertension in her father and mother.   Allergies Allergies  Allergen Reactions   Naproxen Nausea Only   Bactrim [Sulfamethoxazole-Trimethoprim] Nausea Only     Home Medications  Prior to Admission medications   Medication Sig Start Date End Date Taking? Authorizing Provider  albuterol (PROVENTIL HFA;VENTOLIN HFA) 108 (90 Base) MCG/ACT inhaler Inhale 2 puffs into the lungs every 6 (six) hours as needed for wheezing or shortness of breath. 03/13/17  Yes Hairston, Mandesia R, FNP  diphenhydramine-acetaminophen (TYLENOL PM) 25-500 MG TABS tablet Take 3 tablets by mouth at bedtime as needed (sleep).   Yes [provider]  magnesium oxide (MAG-OX) 400 (240 Mg) MG tablet Take 1 tablet (400 mg total) by mouth 3 (three) times daily. 10/30/21  Yes Curt Bears, MD  ondansetron (ZOFRAN ODT) 8 MG disintegrating tablet Dissolve 1 tablet (8 mg total) by mouth every 8 (eight) hours as needed for nausea or vomiting. 10/11/21  Yes Heilingoetter, Cassandra L, PA-C   oxyCODONE ER (XTAMPZA ER) 18 MG C12A Take 1 capsule by mouth 2 times daily. 11/06/21  Yes Gery Pray, MD  oxyCODONE-acetaminophen (PERCOCET/ROXICET) 5-325 MG tablet Take 1 tablet by mouth every 8 hours as needed for severe pain. 12/04/21  Yes Heilingoetter, Cassandra L, PA-C  potassium chloride 20 MEQ/15ML (10%) SOLN Take 15 mLs by mouth 2 times daily. 12/04/21  Yes Heilingoetter, Cassandra L, PA-C     Critical care time: n/a  Lanier Clam, MD [place] for contact info

## 2021-12-19 NOTE — Assessment & Plan Note (Addendum)
2/2 above Started on low dose scheduled metop on 1/24 eICU scheduled PO metop 1/26 overnight Echo with EF 60-65%, RVSF normal (see report) Suspect driven by respiratory issues above Repeat CT to r/o PE given RUE DVT, negative for PE

## 2021-12-19 NOTE — Assessment & Plan Note (Addendum)
Per prior notes, patient contemplating stopping chemo Appreciate palliative care assistance (1/27 note)

## 2021-12-19 NOTE — Assessment & Plan Note (Signed)
Replace potassium, magnesium, phosphorus as needed

## 2021-12-19 NOTE — Assessment & Plan Note (Signed)
resolved 

## 2021-12-19 NOTE — Progress Notes (Addendum)
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  Currently on chemotherapy, last infusion on 12/06/2021.  Her nausea and vomiting has progressed and she has been unable to keep her medications down.  Patient denied fever, no chills, no headache, no diarrhea..   In the ED, temperature 98.4 F, HR 145, RR 18, BP 104/30, SPO2 100% on room air.  Sodium 133, potassium 3.7, chloride 102, CO2 16, glucose 145, BUN 22, creatinine 1.64, AST 17, ALT 13, total bilirubin 0.9.  WBC 0.3, hemoglobin 7.0, platelets 6.  High sensitivity troponin 3, <2.  Lactic acid 2.9.  Chest x-ray with no active cardiopulmonary disease process, similar small right suprahilar opacity.  CT angiogram chest with no evidence of pulmonary embolism, improving mediastinal/right hilar mass/adenopathy.  Oncology was consulted.  TRH consulted for further evaluation and management of intractable nausea/vomiting, pancytopenia.    Assessment & Plan:   Principal Problem:   Acute respiratory failure (HCC) Active Problems:   Pneumonia   Aspiration pneumonia (HCC)   Neutropenic fever (HCC)   Nausea and vomiting   Radiation-induced esophagitis   Pancytopenia (HCC)   Sinus tachycardia   Hypomagnesemia   Hypokalemia   Hypophosphatemia   Electrolyte abnormality   Hyponatremia   AKI (acute kidney injury) ([place])   Malignant neoplasm of right upper lobe of lung (HCC)    Cancer related pain   Tobacco abuse   Goals of care, counseling/discussion   Dehydration   Chest pain   Hyperglycemia   Leukopenia due to antineoplastic chemotherapy (HCC)   Normocytic anemia   Generalized weakness   * Acute respiratory failure (HCC) Currently on 4 L [place]  CXR with patchy opacities in medial R base, developing infection? CT on 1/20 with thickened upper to mid thoracic esophagus, ? Radiation esophagitis Significant upper airway wheezing on exam, appreciate pulmonary assistance -> concern for element of vocal cord dysfunction or radiation induced upper airway inflammation Aggressive reflux treatment Reflux precautions bipap prn Lasix trial per pulm today Will trial steroids Continue antibiotics for pneumonia   Aspiration pneumonia (HCC) Continue cefepime, follow   Neutropenic fever (HCC) Last true fever 1/23 granix Severe neutropenia persistent Requested oncology to see  Radiation-induced esophagitis PPI H2 blocker relfux precautions  Nausea and vomiting- (present on admission) Continue antiemetics  Sinus tachycardia 2/2 above Started on low dose scheduled metop on 1/24 Net positive ~12 L Will get echo, suspect this is driven by respiratory process above   Pancytopenia ([place])- (present on admission) Related to chemo Fluctuating Continue granix Trend counts, transfuse as appropriate  Electrolyte abnormality Replace potassium, magnesium, phosphorus as needed  AKI (acute kidney injury) ([place])- (present on admission) resolved  Hyponatremia- (present on admission) resolved  Cancer related pain Oxycontin, oxycodone, dilaudid for breakthrough  Malignant neoplasm of right upper lobe of lung ([place])- (present on admission) Small Cell Lung Cancer Follows with Dr. Julien Nordmann outpatient Last chemo 12/06/2021  Tobacco abuse Encouraged cessation  Goals of care, counseling/discussion Per prior notes, patient contemplating stopping chemo Appreciate  palliative care assistance (1/24 note)   DVT prophylaxis: SCD Code Status: full Family Communication: mother Disposition:   Status is: Inpatient  Remains inpatient appropriate because: need for further inpatient care, goc discussions, IV steroids, IV abx       Consultants:  PCCM Oncology Palliative care  Procedures:  none  Antimicrobials:  Anti-infectives (From admission, onward)    Start     Dose/Rate Route Frequency Ordered Stop   12/18/21 0800  vancomycin (VANCOREADY) IVPB 1000 mg/200 mL  Status:  Discontinued        1,000 mg 200 mL/hr over 60 Minutes Intravenous Every 12 hours 12/17/21 1859 12/19/21 0912   12/17/21 2200  ceFEPIme (MAXIPIME) 2 g in sodium chloride 0.9 % 100 mL IVPB  Status:  Discontinued        2 g 200 mL/hr over 30 Minutes Intravenous Every 8 hours 12/17/21 1750 12/17/21 1756   12/17/21 1915  vancomycin (VANCOREADY) IVPB 1500 mg/300 mL        1,500 mg 150 mL/hr over 120 Minutes Intravenous  Once 12/17/21 1800 12/17/21 2057   12/17/21 1845  ceFEPIme (MAXIPIME) 2 g in sodium chloride 0.9 % 100 mL IVPB        2 g 200 mL/hr over 30 Minutes Intravenous Every 8 hours 12/17/21 1756         Subjective: C/o SOB Mother at bedside  Objective: Vitals:   12/19/21 1519 12/19/21 1542 12/19/21 1555 12/19/21 1600  BP:  136/87  119/70  Pulse: (!) 122 (!) 141 (!) 124 (!) 119  Resp: 19 17 (!) 9 12  Temp:      TempSrc:      SpO2: 94% (!) 88% 94% 97%  Weight:      Height:        Intake/Output Summary (Last 24 hours) at 12/19/2021 1627 Last data filed at 12/19/2021 1600 Gross per 24 hour  Intake 1643.94 ml  Output 3400 ml  Net -1756.06 ml   Filed Weights   12/15/21 0117  Weight: 68.8 kg    Examination:  General: appears uncomfortable with breathing Cardiovascular: Heart sounds show [name] regular rate, and rhythm.  Lungs: diminished breath sounds, significant upper airway wheezing Abdomen: Soft, nontender, nondistended  Neurological: Alert and  oriented 3. Moves all extremities 4. Cranial nerves II through XII grossly intact. Skin: Warm and dry. No rashes or lesions. Extremities: No clubbing or cyanosis. No edema.   Data Reviewed: I have personally reviewed following labs and imaging studies  CBC: Recent Labs  Lab 12/14/21 1626 12/15/21 0307 12/16/21 0304 12/16/21 2154 12/17/21 0321 12/17/21 1701 12/18/21 0237 12/18/21 2140 12/19/21 0535 12/19/21 1548  WBC 0.3*   < > 0.2*   < > 0.3* 0.3* 0.8* 1.5* 1.0*  --   NEUTROABS 0.0*  --  0.0*  --   --   --  0.2*  --  0.4*  --   HGB 7.0*   < > 9.0*   < > 9.6* 8.2* 8.6* 7.9* 7.3* 8.4*  HCT 19.5*   < > 24.8*   < > 28.2* 23.5* 24.5* 23.9* 21.4* 24.4*  MCV 95.6   < > 89.5   < > 93.1 91.8 92.5 95.2 95.1  --   PLT 6*   < > 9*   < > 16* 8* 6* 87* 48*  --    < > = values in this interval not displayed.    Basic Metabolic Panel: Recent Labs  Lab 12/15/21 0307 12/16/21 0304 12/17/21 0321  12/17/21 1701 12/18/21 0237 12/18/21 1208 12/19/21 0535  NA 132* 131* 133* 131* 134* 135 136  K 2.9* 2.7* 3.1* 3.7 4.2 3.7 3.2*  CL 104 105 103 103 107 104 102  CO2 19* 18* 20* 20* 19* 21* 26  GLUCOSE 116* 124* 119* 127* 113* 136* 114*  BUN 16 12 8 8 10 12 14   CREATININE 1.23* 0.94 1.02* 0.79 0.84 0.69 0.86  CALCIUM 8.0* 7.7* 8.2* 8.0* 8.3* 8.4* 8.5*  MG 1.7 1.0* 1.5*  --  1.5*  --  1.7  PHOS 2.0*  --  1.5*  --  2.4*  --  2.8    GFR: Estimated Creatinine Clearance: 78.9 mL/min (by C-G formula based on SCr of 0.86 mg/dL).  Liver Function Tests: Recent Labs  Lab 12/14/21 1626 12/15/21 0307 12/17/21 1701  AST 17 14* 11*  ALT 13 11 12   ALKPHOS 76 62 59  BILITOT 0.9 0.9 1.0  PROT 7.4 6.0* 6.7  ALBUMIN 3.5 2.9* 2.8*    CBG: No results for input(s): GLUCAP in the last 168 hours.   Recent Results (from the past 240 hour(s))  Culture, blood (routine x 2)     Status: None   Collection Time: 12/14/21  4:26 PM   Specimen: BLOOD RIGHT HAND  Result Value Ref Range Status    Specimen Description   Final    BLOOD RIGHT HAND Performed at [facility] [address], [place], [place] [address]    Special Requests   Final    BOTTLES DRAWN AEROBIC AND ANAEROBIC Blood Culture results may not be optimal due to an inadequate volume of blood received in culture bottles Performed at [facility] [address] [place], [place] [address]    Culture   Final    NO GROWTH 5 DAYS Performed at [facility], [place] [address], [place], [place] [address]    Report Status 12/19/2021 FINAL  Final  Culture, blood (routine x 2)     Status: None   Collection Time: 12/14/21  4:31 PM   Specimen: Left Antecubital; Blood  Result Value Ref Range Status   Specimen Description   Final    LEFT ANTECUBITAL Performed at [facility] [address] [place], [place] [address]    Special Requests   Final    BOTTLES DRAWN AEROBIC AND ANAEROBIC Blood Culture adequate volume Performed at [facility] [address] [place], [place] [address]    Culture   Final    NO GROWTH 5 DAYS Performed at [facility], [place] [address] [place], [place] [address]    Report Status 12/19/2021 FINAL  Final  Resp Panel by RT-PCR (Flu Klein Willcox&B, Covid) Nasopharyngeal Swab     Status: None   Collection Time: 12/14/21  4:38 PM   Specimen: Nasopharyngeal Swab; Nasopharyngeal(NP) swabs in vial transport medium  Result Value Ref Range Status   SARS Coronavirus 2 by RT PCR NEGATIVE NEGATIVE Final    Comment: (NOTE) SARS-CoV-2 target nucleic acids are NOT DETECTED.  The SARS-CoV-2 RNA is generally detectable in upper respiratory specimens during the acute phase of infection. The lowest concentration of SARS-CoV-2 viral copies this assay can detect is 138 copies/mL. Payam Gribble negative result does not preclude SARS-Cov-2 infection and should not be used as the sole basis for treatment or other patient management decisions. Atha Mcbain negative  result may occur with  improper specimen collection/handling, submission of specimen other than nasopharyngeal swab, presence of  viral mutation(s) within the areas targeted by this assay, and inadequate number of viral copies(<138 copies/mL). Jillane Po negative result must be combined with clinical observations, patient history, and epidemiological information. The expected result is Negative.  Fact Sheet for Patients:  EntrepreneurPulse.com.au  Fact Sheet for Healthcare Providers:  IncredibleEmployment.be  This test is no t yet approved or cleared by the [place] FDA and  has been authorized for detection and/or diagnosis of SARS-CoV-2 by FDA under an Emergency Use Authorization (EUA). This EUA will remain  in effect (meaning this test can be used) for the duration of the COVID-19 declaration under Section 564(b)(1) of the Act, 21 U.S.C.section 360bbb-3(b)(1), unless the authorization is terminated  or revoked sooner.       Influenza Lilyona Richner by PCR NEGATIVE NEGATIVE Final   Influenza B by PCR NEGATIVE NEGATIVE Final    Comment: (NOTE) The Xpert Xpress SARS-CoV-2/FLU/RSV plus assay is intended as an aid in the diagnosis of influenza from Nasopharyngeal swab specimens and should not be used as Derec Mozingo sole basis for treatment. Nasal washings and aspirates are unacceptable for Xpert Xpress SARS-CoV-2/FLU/RSV testing.  Fact Sheet for Patients: EntrepreneurPulse.com.au  Fact Sheet for Healthcare Providers: IncredibleEmployment.be  This test is not yet approved or cleared by the [place] FDA and has been authorized for detection and/or diagnosis of SARS-CoV-2 by FDA under an Emergency Use Authorization (EUA). This EUA will remain in effect (meaning this test can be used) for the duration of the COVID-19 declaration under Section 564(b)(1) of the Act, 21 U.S.C. section 360bbb-3(b)(1), unless the authorization is  terminated or revoked.  Performed at [facility], [place] [address] [place], [place] [address]   Culture, blood (routine x 2)     Status: None (Preliminary result)   Collection Time: 12/17/21  6:32 PM   Specimen: BLOOD LEFT HAND  Result Value Ref Range Status   Specimen Description   Final    BLOOD LEFT HAND Performed at [facility] [address], [place], [place] [address]    Special Requests   Final    BOTTLES DRAWN AEROBIC ONLY Blood Culture results may not be optimal due to an inadequate volume of blood received in culture bottles Performed at [facility] [address] [place], [place] [address]    Culture   Final    NO GROWTH 2 DAYS Performed at [facility] [address], [place], [place] [address]    Report Status PENDING  Incomplete  Culture, blood (routine x 2)     Status: None (Preliminary result)   Collection Time: 12/17/21  6:32 PM   Specimen: BLOOD LEFT FOREARM  Result Value Ref Range Status   Specimen Description   Final    BLOOD LEFT FOREARM Performed at [facility] [address], [place], [place] [address]    Special Requests   Final    BOTTLES DRAWN AEROBIC ONLY Blood Culture results may not be optimal due to an inadequate volume of blood received in culture bottles Performed at [facility] [address] [place], [place] [address]    Culture   Final    NO GROWTH 2 DAYS Performed at [facility] [address], [place],  [address]    Report Status PENDING  Incomplete  Urine Culture     Status: Abnormal   Collection Time: 12/18/21  3:00 AM   Specimen: Urine, Clean Catch  Result Value Ref Range Status  Specimen Description   Final    URINE, CLEAN CATCH Performed at [facility], [place] [address] [place], [place] [address]    Special Requests   Final    Immunocompromised Performed at [facility], [place] [address], [place], [place] [address]    Culture (Contrina Orona)  Final    <10,000 COLONIES/mL INSIGNIFICANT GROWTH Performed at [facility] [address] [place], [place] [address]    Report Status 12/19/2021 FINAL  Final  MRSA Next Gen by PCR, Nasal     Status: None   Collection Time: 12/19/21  5:40 AM   Specimen: Nasal Mucosa; Nasal Swab  Result Value Ref Range Status   MRSA by PCR Next Gen NOT DETECTED NOT DETECTED Final    Comment: (NOTE) The GeneXpert MRSA Assay (FDA approved for NASAL specimens only), is one component of [place] comprehensive MRSA colonization surveillance program. It is not intended to diagnose MRSA infection nor to guide or monitor treatment for MRSA infections. Test performance is not FDA approved in patients less than 103 years old. Performed at [facility], [place] [address] [place], [place] [address]          Radiology Studies: DG Chest 1 View  Result Date: 12/18/2021 CLINICAL DATA:  Increased shortness of breath EXAM: CHEST  1 VIEW COMPARISON:  12/18/2021 FINDINGS: The heart size and mediastinal contours are within normal limits. Both lungs are clear. The visualized skeletal structures are unremarkable. IMPRESSION: No active disease. Electronically Signed   By: Kathreen Devoid M.D.   On: 12/18/2021 10:08   DG CHEST PORT 1 VIEW  Result Date: 12/19/2021 CLINICAL DATA:  Pneumonia EXAM: PORTABLE CHEST 1 VIEW COMPARISON:  Chest radiograph 12/19/2021 FINDINGS: There is Keaundra Stehle new right upper extremity PICC terminating at the cavoatrial junction. The cardiomediastinal silhouette is stable. There are patchy opacities in the medial right base, slightly increased from prior. There is no other focal airspace disease. There is no significant pleural effusion. There is no pneumothorax. There is no acute osseous abnormality. IMPRESSION: 1. New right upper extremity PICC with tip at the region of the cavoatrial junction. 2. Patchy opacities in the  medial right base are slightly increased from prior and could reflect developing infection. Electronically Signed   By: Valetta Mole M.D.   On: 12/19/2021 08:04   DG CHEST PORT 1 VIEW  Result Date: 12/18/2021 CLINICAL DATA:  Shortness of breath EXAM: PORTABLE CHEST 1 VIEW COMPARISON:  12/17/2021 FINDINGS: Lungs are hyperinflated as can be seen with COPD. Hazy left lower lobe airspace disease which may reflect atelectasis versus pneumonia unchanged compared with the prior exam. No pleural effusion or pneumothorax. Heart and mediastinal contours are unremarkable. No acute osseous abnormality. IMPRESSION: Stable hazy left lower lobe airspace disease which may reflect atelectasis versus pneumonia. Electronically Signed   By: Kathreen Devoid M.D.   On: 12/18/2021 08:40   DG CHEST PORT 1 VIEW  Result Date: 12/17/2021 CLINICAL DATA:  Nausea and shortness of breath. EXAM: PORTABLE CHEST 1 VIEW COMPARISON:  Radiographs and chest CTA 3 days ago 12/14/2021, FINDINGS: Developing patchy opacity at the left lung base, new from prior exam. There is increase some bronchial thickening. Normal heart size with unchanged mediastinal contours. No significant pleural effusion or pneumothorax. IMPRESSION: 1. Developing patchy opacity at the left lung base over the last 3 days may be atelectasis or pneumonia. 2. Increased bronchial thickening. Electronically Signed   By: Keith Rake M.D.   On:  12/17/2021 17:35   [place] EKG SITE RITE  Result Date: 12/18/2021 If Site Rite image not attached, placement could not be confirmed due to current cardiac rhythm.       Scheduled Meds:  chlorhexidine  15 mL Mouth Rinse BID   Chlorhexidine Gluconate Cloth  6 each Topical Daily   dextromethorphan-guaiFENesin  1 tablet Oral BID   feeding supplement  1 Container Oral TID BM   mouth rinse  15 mL Mouth Rinse q12n4p   methylPREDNISolone (SOLU-MEDROL) injection  40 mg Intravenous Q12H   metoprolol tartrate  2.5 mg Intravenous Q8H    multivitamin with minerals  1 tablet Oral Daily   nicotine  21 mg Transdermal Daily   oxyCODONE  20 mg Oral Q12H   pantoprazole (PROTONIX) IV  40 mg Intravenous Q12H   sodium chloride flush  10-40 mL Intracatheter Q12H   Continuous Infusions:  sodium chloride Stopped (12/19/21 1435)   ceFEPime (MAXIPIME) IV Stopped (12/19/21 1345)   famotidine (PEPCID) IV Stopped (12/19/21 1214)   potassium chloride 10 mEq (12/19/21 1529)   promethazine (PHENERGAN) injection (IM or IVPB) Stopped (12/19/21 1453)     LOS: 5 days    Time spent: over 71 min    Fayrene Helper, MD [facility]   To contact the attending provider between 7A-7P or the covering provider during after hours 7P-7A, please log into the web site [email or web address] and access using universal [place] password for that web site. If you do not have the password, please call the hospital operator.  12/19/2021, 4:27 PM

## 2021-12-19 NOTE — Assessment & Plan Note (Signed)
Small Cell Lung Cancer Follows with Dr. Julien Nordmann outpatient Last chemo 12/06/2021

## 2021-12-19 NOTE — Progress Notes (Signed)
DIAGNOSIS: Limited stage (T3, N2, M0) small cell lung cancer presented with right hilar/suprahilar mass with occlusion of the right upper lobe bronchus in addition to right paratracheal, right infra hilar and subcarinal lymphadenopathy as well as additional right upper lobe and right apical pulmonary nodules.  This was diagnosed in October 2022.   PRIOR THERAPY: None   CURRENT THERAPY: Systemic chemotherapy with cisplatin 80 Mg/M2 on day 1 and etoposide 100 Mg/M2 on days 1, 2 and 3 every 3 weeks.  First dose October 03, 2021.  This will be concurrent with radiotherapy under the care of Dr. Sondra Come. Status post 4 cycles.  Her last cycle of chemotherapy started on December 04, 2021.  Subjective: The patient is seen and examined today.  She continues to complain of persistent cough and chest congestion as well as shortness of breath at baseline.  She was admitted at [facility] on December 14, 2021 complaining of shortness of breath, nausea, chest pain and dizziness.  Blood work at that time showed severe pancytopenia.  Her total white blood count was 0.3 with absolute neutrophil count of 0.  She also had anemia with hemoglobin of 7.0 and platelets count of 6000.  The patient received PRBCs and platelet transfusion as well as Granix for the chemotherapy-induced neutropenia.  Her count is slowly improving.  Her total white blood count today was 1.0 with absolute neutrophil count of 400.  Hemoglobin of 7.3 and platelets count after transfusion of 48,000.  The patient also has significant radiation-induced esophagitis as well as hypomagnesemia and hypokalemia secondary to her previous treatment.  Objective: Vital signs in last 24 hours: Temp:  [97.7 F (36.5 C)-98.2 F (36.8 C)] 97.7 F (36.5 C) (01/25 1630) Pulse Rate:  [93-141] 127 (01/25 1800) Resp:  [9-28] 18 (01/25 1800) BP: (99-169)/(67-121) 115/83 (01/25 1800) SpO2:  [88 %-100 %] 91 % (01/25 1800) FiO2 (%):  [36 %-60 %] 36 % (01/25  1519)  Intake/Output from previous day: 01/24 0701 - 01/25 0700 In: 2276.8 [I.V.:368.1; Blood:1066.3; IV Piggyback:842.4] Out: 2475 [Urine:2475] Intake/Output this shift: No intake/output data recorded.  General appearance: alert, cooperative, fatigued, and mild distress Resp: rales bilaterally Cardio: regular rate and rhythm, S1, S2 normal, no murmur, click, rub or gallop GI: soft, non-tender; bowel sounds normal; no masses,  no organomegaly Extremities: extremities normal, atraumatic, no cyanosis or edema  Lab Results:  Recent Labs    12/18/21 2140 12/19/21 0535 12/19/21 1548  WBC 1.5* 1.0*  --   HGB 7.9* 7.3* 8.4*  HCT 23.9* 21.4* 24.4*  PLT 87* 48*  --    BMET Recent Labs    12/18/21 1208 12/19/21 0535  NA 135 136  K 3.7 3.2*  CL 104 102  CO2 21* 26  GLUCOSE 136* 114*  BUN 12 14  CREATININE 0.69 0.86  CALCIUM 8.4* 8.5*    Studies/Results: DG Chest 1 View  Result Date: 12/18/2021 CLINICAL DATA:  Increased shortness of breath EXAM: CHEST  1 VIEW COMPARISON:  12/18/2021 FINDINGS: The heart size and mediastinal contours are within normal limits. Both lungs are clear. The visualized skeletal structures are unremarkable. IMPRESSION: No active disease. Electronically Signed   By: Kathreen Devoid M.D.   On: 12/18/2021 10:08   DG CHEST PORT 1 VIEW  Result Date: 12/19/2021 CLINICAL DATA:  Pneumonia EXAM: PORTABLE CHEST 1 VIEW COMPARISON:  Chest radiograph 12/19/2021 FINDINGS: There is a new right upper extremity PICC terminating at the cavoatrial junction. The cardiomediastinal silhouette is stable. There are patchy  opacities in the medial right base, slightly increased from prior. There is no other focal airspace disease. There is no significant pleural effusion. There is no pneumothorax. There is no acute osseous abnormality. IMPRESSION: 1. New right upper extremity PICC with tip at the region of the cavoatrial junction. 2. Patchy opacities in the medial right base are  slightly increased from prior and could reflect developing infection. Electronically Signed   By: Valetta Mole M.D.   On: 12/19/2021 08:04   DG CHEST PORT 1 VIEW  Result Date: 12/18/2021 CLINICAL DATA:  Shortness of breath EXAM: PORTABLE CHEST 1 VIEW COMPARISON:  12/17/2021 FINDINGS: Lungs are hyperinflated as can be seen with COPD. Hazy left lower lobe airspace disease which may reflect atelectasis versus pneumonia unchanged compared with the prior exam. No pleural effusion or pneumothorax. Heart and mediastinal contours are unremarkable. No acute osseous abnormality. IMPRESSION: Stable hazy left lower lobe airspace disease which may reflect atelectasis versus pneumonia. Electronically Signed   By: Kathreen Devoid M.D.   On: 12/18/2021 08:40   [place] EKG SITE RITE  Result Date: 12/18/2021 If Site Rite image not attached, placement could not be confirmed due to current cardiac rhythm.   Medications: I have reviewed the patient's current medications.  CODE STATUS: Full code  Assessment/Plan: This is a very pleasant 51 years old white female diagnosed with limited stage (T3, N0, M0) small cell lung cancer presented with right hilar/suprahilar mass with occlusion of the right upper lobe bronchus in addition to right paratracheal and right infrahilar lymphadenopathy diagnosed in October 2022. This is a potentially curable condition. The patient underwent an aggressive course of treatment with systemic chemotherapy with 4 cycles of cisplatin and etoposide concurrent with radiation for 6 weeks during her treatment.  Her last and final cycle of the chemotherapy started December 04, 2021. The patient is currently admitted with chemotherapy induced pancytopenia which is expected in a patient with this condition and this kind of treatment especially with the cumulative side effects close to the end of the treatment. She also has radiation-induced esophagitis. I strongly recommend for the patient to continue with  aggressive supportive therapy for her chemotherapy-induced pancytopenia and also to treat her suspicious aspiration pneumonia aggressively. For the anemia and thrombocytopenia, will continue with PRBCs transfusion for hemoglobin less than 8.0 and platelet transfusion for platelets count less than 20,000. For the chemotherapy-induced neutropenia I will continue the patient on Granix 300 mcg subcutaneously on daily basis until the absolute neutrophil count is over 1500. For the pneumonia, will continue with the current antibiotics. The patient may also benefit from treatment with high-dose prednisone especially with the possibility of radiation-induced pneumonitis. For the radiation-induced esophagitis, will continue with pain medications in addition to Carafate. For the lack of appetite and dehydration.  We will continue with IV hydration to support her blood pressure and kidney function. Thank you again for taking good care of Ms. Guo.  I will continue to follow-up the patient with you and assist in her management on as-needed basis. Disclaimer: This note was dictated with voice recognition software. Similar sounding words can inadvertently be transcribed and may be missed upon review.  LOS: 5 days    Eilleen Kempf 12/19/2021

## 2021-12-19 NOTE — Assessment & Plan Note (Addendum)
Started on OxyContin 20 mg twice daily as a long-acting pain medicine. Patient prefers to use oral Dilaudid instead of oxycodone, will start patient on Dilaudid 2 mg every 4 hours as needed. She will need pain regimen on discharge.  Continue as needed IV Dilaudid.

## 2021-12-19 NOTE — Assessment & Plan Note (Addendum)
related to chemo Fluctuating and persistent thrombocytopenia and anemia WBC improved Improved after transfusion (pRBC's and platelets)

## 2021-12-19 NOTE — Progress Notes (Signed)
Daily Progress Note   Patient Name: [name]       Date: 12/19/2021 DOB: [birth year]  Age: 51 y.o. MRN#: [id number] Attending Physician: Elodia Florence., * Primary Care Physician: Etter Sjogren Admit Date: 12/14/2021  Reason for Consultation/Follow-up: Establishing goals of care  Subjective: I saw and examined [name] this evening.  She was lying in bed in no distress.  She woke but was sleepy (had just received medications).  Reviewed conversation from yesterday but she reports she is sleepy, needs more time to consider her options, and wants to rest.    Length of Stay: 5  Current Medications: Scheduled Meds:   chlorhexidine  15 mL Mouth Rinse BID   Chlorhexidine Gluconate Cloth  6 each Topical Daily   dextromethorphan-guaiFENesin  1 tablet Oral BID   feeding supplement  1 Container Oral TID BM   mouth rinse  15 mL Mouth Rinse q12n4p   methylPREDNISolone (SOLU-MEDROL) injection  40 mg Intravenous Q12H   metoprolol tartrate  2.5 mg Intravenous Q8H   multivitamin with minerals  1 tablet Oral Daily   nicotine  21 mg Transdermal Daily   oxyCODONE  20 mg Oral Q12H   pantoprazole (PROTONIX) IV  40 mg Intravenous Q12H   sodium chloride flush  10-40 mL Intracatheter Q12H    Continuous Infusions:  sodium chloride Stopped (12/19/21 1435)   ceFEPime (MAXIPIME) IV Stopped (12/19/21 2216)   famotidine (PEPCID) IV Stopped (12/19/21 1214)   promethazine (PHENERGAN) injection (IM or IVPB) Stopped (12/19/21 1453)    PRN Meds: sodium chloride, acetaminophen **OR** [DISCONTINUED] acetaminophen, guaiFENesin-dextromethorphan, HYDROmorphone (DILAUDID) injection, labetalol, levalbuterol, LORazepam, ondansetron (ZOFRAN) IV, oxyCODONE, promethazine **OR** promethazine  (PHENERGAN) injection (IM or IVPB), sodium chloride flush  Physical Exam         General: Alert, awake, chronically ill-appearing.   HEENT: No bruits, no goiter, no JVD Heart: Tachycardic. No murmur appreciated. Lungs: Fair air movement, coarse throughout Abdomen: Soft, nontender, nondistended, positive bowel sounds.   Ext: No significant edema Skin: Warm and dry Neuro: Grossly intact, nonfocal.  Vital Signs: BP 108/64 (BP Location: Left Arm)    Pulse (!) 112    Temp 97.9 F (36.6 C) (Axillary)    Resp 10    Ht 5\' 8"  (1.727 m)    Wt 68.8 kg  LMP 04/24/2018    SpO2 96%    BMI 23.06 kg/m  SpO2: SpO2: 96 % O2 Device: O2 Device: High Flow Nasal Cannula O2 Flow Rate: O2 Flow Rate (L/min): 6 L/min  Intake/output summary:  Intake/Output Summary (Last 24 hours) at 12/19/2021 2233 Last data filed at 12/19/2021 6283 Gross per 24 hour  Intake 1483.06 ml  Output 2950 ml  Net -1466.94 ml   LBM: Last BM Date: 12/16/21 Baseline Weight: Weight: 68.8 kg Most recent weight: Weight: 68.8 kg       Palliative Assessment/Data:    Flowsheet Rows    Flowsheet Row Most Recent Value  Intake Tab   Referral Department Hospitalist  Unit at Time of Referral ICU  Palliative Care Primary Diagnosis Cancer  Date Notified 12/17/21  Palliative Care Type New Palliative care  Reason for referral Clarify Goals of Care  Date of Admission 12/14/21  Date first seen by Palliative Care 12/18/21  # of days Palliative referral response time 1 Day(s)  # of days IP prior to Palliative referral 3  Clinical Assessment   Palliative Performance Scale Score 40%  Psychosocial & Spiritual Assessment   Palliative Care Outcomes   Patient/Family meeting held? Yes  Who was at the meeting? Patient  Palliative Care Outcomes Clarified goals of care       Patient Active Problem List   Diagnosis Date Noted   Hypophosphatemia 12/19/2021   Electrolyte abnormality 12/19/2021   Cancer related pain 12/19/2021   Tobacco  abuse 12/19/2021   Sinus tachycardia 12/19/2021   Neutropenic fever ([place]) 12/18/2021   Acute on chronic respiratory failure with hypoxia ([facility]) 12/18/2021   Pneumonia 12/18/2021   Acute respiratory failure ([facility]) 12/18/2021   Aspiration pneumonia ([place]) 12/18/2021   Radiation-induced esophagitis 12/18/2021   Chemotherapy induced nausea and vomiting 12/14/2021   Chest pain 12/14/2021   Hypomagnesemia 12/14/2021   Hypokalemia 12/14/2021   Hyponatremia 12/14/2021   Hyperglycemia 12/14/2021   Leukopenia due to antineoplastic chemotherapy ([place]) 12/14/2021   Pancytopenia ([place]) 12/14/2021   Normocytic anemia 12/14/2021   AKI (acute kidney injury) ([place]) 12/14/2021   Generalized weakness 12/14/2021   Dehydration 11/13/2021   Goals of care, counseling/discussion 09/28/2021   Malignant neoplasm of right upper lobe of lung ([place]) 09/05/2021   Status post lumbar microdiscectomy 06/24/2018   Left lumbar radiculitis 04/25/2017   Lumbar degenerative disc disease 04/25/2017    Palliative Care Assessment & Plan   Patient Profile: 51 y.o. 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  She was transferred to PICU due to increased work of breathing.  She continues to suffer from acute hypoxemic respiratory failure due to left lower lobe pneumonia.  She has been evaluated by and recommendation for continuation of antibiotics BiPAP.  She also has radiation-induced esophagitis.  She is reported she is not interested in further chemotherapy.  Palliative consulted for goals of care.  Recommendations/Plan: Full code/full scope She has reported that she does not want to pursue further chemotherapy.  She reports needing time to consider further before making any decisions.  She desired to continue with plan for full scope/full  code treatment. Palliative to follow-up to continue conversation based upon clinical course.  Goals of Care and Additional Recommendations: Limitations on Scope of Treatment: Full Scope Treatment  Code Status:    Code Status Orders  (From admission,  onward)           Start     Ordered   12/14/21 2118  Full code  Continuous        12/14/21 2118           Code Status History     Date Active Date Inactive Code Status Order ID Comments User Context   05/13/2018 1737 05/14/2018 1211 Full Code [id number]  Lanae Crumbly, PA-C Inpatient       Prognosis:  Unable to determine  Discharge Planning: To Be Determined  Care plan was discussed with patient  Thank you for allowing the Palliative Medicine Team to assist in the care of this patient.  Micheline Rough, MD  Please contact Palliative Medicine Team phone at [phone] for questions and concerns.

## 2021-12-19 NOTE — Progress Notes (Incomplete)
Radiation Oncology         [phone] ________________________________  Name: [name] MRN: [id number]  Date: 12/20/2021  DOB: 1971-06-15  Follow-Up Visit Note  CC: [name], PA-C  Livengood, Jessica J, [place]*  No diagnosis found.  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  The patient tolerated radiation therapy with some difficulty. She reported esophagitis and chest/throat pain from frequent coughing, fatigue, an itching sensation on her back, mild skin irritation of the chest, frequent productive cough, and trouble swallowing that caused her difficulty getting any liquids or solids down. She also reports ongoing chronic tachycardia which was present prior to RT. I advised her to use lotion over RT skin irritation.   Since her initial consultation date (09/10/21), the patient underwent bronchoscopy and biopsies on 09/21/21 under the care of Dr. Lamonte Sakai. RUL biopsies performed revealed malignant cells consistent with small cell carcinoma. FNA of lymph nodes 11R, 10R, and 4R revealed malignant cells consistent with small cell carcinoma in all 3 lymph nodes.   Of note: the patient presented to the ED on 09/15/21 with recent development of left-sided chest pain extending to the top of the shoulder blade and anterior part of the chest, and associated shortness of breath. CTA of the chest was without evidence of pulmonary embolus, and troponin was normal. Suspected discharge diagnosis was left sided musculoskeletal pain, and she was sent home with pain medication. (CTA performed also showed the known right hilar mass consistent with malignancy, and  causing complete occlusion of the right upper lobe bronchus, as well as attenuation and encasement  of the RUL pulmonary artery).   MRI of the brain on 09/18/21 demonstrated no metastatic disease or acute intracranial abnormality.  Accordingly, the patient met with Dr. Julien Nordmann on 09/27/21 to discuss her treatment options given these new findings. Following discussion of the risks and benefits, the patient opted to proceed with chemotherapy consisting of cisplatin on day 1 and 2, etoposide on days 1, 2 and 3 every 3 weeks for 4 cycles, (with concurrent radiotherapy for 6 weeks). First dose on 10/02/21.  During follow up with Dr. Julien Nordmann and his PA on 10/11/21, the patient reported sore throat. She was given viscous lidocaine but this made her sick, she was also given carafate but the taste made this hard for her to ingest and she would only use it once a day. She reported that her sore throat pain made it difficult for her to eat and drink, though her weight remained relatively stable since her last visit. She also reported some emesis though mostly from choking on phlegm or having a hard time tolerating the tastes/ textures of foods/medications, as well as ongoing dyspnea on exertion.   PET on 10/12/21 demonstrated the hypermetabolic right upper lobe/perihilar mass and adjacent hypermetabolic adenopathy, consistent with known non-small cell lung cancer. Significant interval improvement was appreciated compared to prior CT scan, suggestive of a good response to concurrent chemoradiation. The right apical nodes appeared smaller as well. Otherwise, no findings concerning for abdominal/pelvic metastatic disease were appreciated.   On 11/11/21, the patient presented to the ED with nausea and vomiting x1 day.  On evaluation, the patient reported inability to keep anything down, as well as a burning sensation in her throat and upper chest. Examination performed revealed evidence oral thrush, and she was mildly  tachycardic with a mildly prolonged QT interval. She was given IV fluids and ativan for antiemetic effects with relief, and nystatin for thrush. At discharge, she was sent home with a new antiemetic and instructed to follow up with her PCP.  As of 12/19/20, the patient is currently admitted to [facility]. On 12/14/20, the patient presented with nausea/vomiting, 3 days of worsening SOB, and right-sided chest pain exacerbated with coughing. The patient did undergo chemotherapy that prior week which usually causes her to feel sick afterwards, however she reported her presenting symptoms as worse than normal. The patient also endorsed inability to keep her medications down due to vomiting. Workup in the ED revealed pancytopenia, hyponatremia, hypokalemia, hyperglycemia, and lactic acidosis. Chest x-ray performed showed no active disease, and a similar appearance of the small right suprahilar opacity, corresponding to history of lung cancer. CT angiogram of the chest with contrast showed no evidence of pulmonary embolus, and improving mediastinal and right hilar mass/adenopathy. In the ED, magnesium was replenished and she was treated with IV fentanyl, IV hydration, Zofran, and Compazine.  Oncology was consulted and recommended blood transfusion and platelet transfusion, and admitted the patient for further evaluation and management. Most recent CXR on 12/18/20 again showed no evidence of active disease.                               Allergies:  is allergic to naproxen and bactrim [sulfamethoxazole-trimethoprim].  Meds: No current facility-administered medications for this encounter.   No current outpatient medications on file.   Facility-Administered Medications Ordered in Other Encounters  Medication Dose Route Frequency Provider Last Rate Last Admin   0.9 %  sodium chloride infusion   Intravenous PRN [place] Donnamarie Poag, DO   Stopped at 12/19/21 1435   acetaminophen (TYLENOL) tablet 650 mg  650 mg Oral Q6H  PRN Adefeso, Oladapo, DO   650 mg at 12/17/21 0333   ceFEPIme (MAXIPIME) 2 g in sodium chloride 0.9 % 100 mL IVPB  2 g Intravenous Q8H Dimple Nanas, RPH   Stopped at 12/19/21 1345   chlorhexidine (PERIDEX) 0.12 % solution 15 mL  15 mL Mouth Rinse BID [place] Eric J, DO       Chlorhexidine Gluconate Cloth 2 % PADS 6 each  6 each Topical Daily [place] Eric J, DO   6 each at 12/17/21 1648   dextromethorphan-guaiFENesin (MUCINEX DM) 30-600 MG per 12 hr tablet 1 tablet  1 tablet Oral BID Adefeso, Oladapo, DO   1 tablet at 12/18/21 2229   famotidine (PEPCID) IVPB 20 mg premix  20 mg Intravenous Q24H Erick Colace, NP   Stopped at 12/19/21 1214   feeding supplement (BOOST / RESOURCE BREEZE) liquid 1 Container  1 Container Oral TID BM [place] Donnamarie Poag, DO   1 Container at 12/16/21 1444   guaiFENesin-dextromethorphan (ROBITUSSIN DM) 100-10 MG/5ML syrup 5 mL  5 mL Oral Q4H PRN Adefeso, Oladapo, DO   5 mL at 12/17/21 1224   HYDROmorphone (DILAUDID) injection 0.5-1 mg  0.5-1 mg Intravenous Q3H PRN Kathryne Eriksson, NP   1 mg at 12/19/21 1438   labetalol (NORMODYNE) injection 20 mg  20 mg Intravenous Q4H PRN Hunsucker, Bonna Gains, MD       levalbuterol Penne Lash)  nebulizer solution 0.63 mg  0.63 mg Nebulization Q6H PRN [place] Eric J, DO   0.63 mg at 12/19/21 0344   LORazepam (ATIVAN) injection 0.5 mg  0.5 mg Intravenous Q6H PRN [place] Eric J, DO   0.5 mg at 12/19/21 0346   MEDLINE mouth rinse  15 mL Mouth Rinse q12n4p [place] Eric J, DO   15 mL at 12/19/21 1529   methylPREDNISolone sodium succinate (SOLU-MEDROL) 40 mg/mL injection 40 mg  40 mg Intravenous Q12H Elodia Florence., MD   40 mg at 12/19/21 1033   metoprolol tartrate (LOPRESSOR) injection 2.5 mg  2.5 mg Intravenous Q8H [place] Donnamarie Poag, DO   2.5 mg at 12/19/21 1316   multivitamin with minerals tablet 1 tablet  1 tablet Oral Daily [place] Eric J, DO   1 tablet at 12/18/21 0932   nicotine (NICODERM CQ - dosed in mg/24 hours) patch 21  mg  21 mg Transdermal Daily Adefeso, Oladapo, DO   21 mg at 12/19/21 0954   ondansetron (ZOFRAN) injection 4 mg  4 mg Intravenous Q6H PRN [place] Eric J, DO   4 mg at 12/19/21 1316   oxyCODONE (Oxy IR/ROXICODONE) immediate release tablet 5 mg  5 mg Oral Q4H PRN [place] Eric J, DO   5 mg at 12/18/21 1805   oxyCODONE (OXYCONTIN) 12 hr tablet 20 mg  20 mg Oral Q12H [place] Donnamarie Poag, DO   20 mg at 12/18/21 2227   pantoprazole (PROTONIX) injection 40 mg  40 mg Intravenous Q12H Erick Colace, NP   40 mg at 12/19/21 0955   potassium chloride 10 mEq in 100 mL IVPB  10 mEq Intravenous Q1 Hr x 4 Elodia Florence., MD 100 mL/hr at 12/19/21 1632 10 mEq at 12/19/21 1632   promethazine (PHENERGAN) tablet 25 mg  25 mg Oral Q6H PRN Elodia Florence., MD       Or   promethazine (PHENERGAN) 6.25 mg in sodium chloride 0.9 % 50 mL IVPB  6.25 mg Intravenous Q6H PRN Elodia Florence., MD   Stopped at 12/19/21 1453   sodium chloride flush (NS) 0.9 % injection 10-40 mL  10-40 mL Intracatheter Q12H [place] Donnamarie Poag, DO   10 mL at 12/19/21 0955   sodium chloride flush (NS) 0.9 % injection 10-40 mL  10-40 mL Intracatheter PRN [place] Eric J, DO        Physical Findings: The patient is in no acute distress. Patient is alert and oriented.  vitals were not taken for this visit. .  No significant changes. Lungs are clear to auscultation bilaterally. Heart has regular rate and rhythm. No palpable cervical, supraclavicular, or axillary adenopathy. Abdomen soft, non-tender, normal bowel sounds. ***   Lab Findings: Lab Results  Component Value Date   WBC 1.0 (LL) 12/19/2021   HGB 8.4 (L) 12/19/2021   HCT 24.4 (L) 12/19/2021   MCV 95.1 12/19/2021   PLT 48 (L) 12/19/2021    Radiographic Findings: DG Chest 1 View  Result Date: 12/18/2021 CLINICAL DATA:  Increased shortness of breath EXAM: CHEST  1 VIEW COMPARISON:  12/18/2021 FINDINGS: The heart size and mediastinal contours are within normal limits.  Both lungs are clear. The visualized skeletal structures are unremarkable. IMPRESSION: No active disease. Electronically Signed   By: Kathreen Devoid M.D.   On: 12/18/2021 10:08   DG Chest 2 View  Result Date: 11/28/2021 CLINICAL DATA:  Cough, neutropenia and chills. EXAM: CHEST - 2 VIEW COMPARISON:  09/21/2021 FINDINGS:  Normal heart size. No signs of pleural effusion or edema. Significant reduction and previously noted perihilar right upper lobe lung mass consistent with known non-small cell lung cancer. No acute airspace consolidation or atelectasis. No acute osseous findings. Previously noted multifocal FDG avid bone metastases are better seen on prior PET-CT from 10/12/2021. IMPRESSION: 1. No acute cardiopulmonary abnormalities. 2. Significant reduction in right upper lobe lung mass consistent with known non-small cell lung cancer. Electronically Signed   By: Kerby Moors M.D.   On: 11/28/2021 16:17   CT Angio Chest PE W/Cm &/Or Wo Cm  Result Date: 12/14/2021 CLINICAL DATA:  Right chest pain, tachycardia. Lung cancer. Shortness of breath. EXAM: CT ANGIOGRAPHY CHEST WITH CONTRAST TECHNIQUE: Multidetector CT imaging of the chest was performed using the standard protocol during bolus administration of intravenous contrast. Multiplanar CT image reconstructions and MIPs were obtained to evaluate the vascular anatomy. RADIATION DOSE REDUCTION: This exam was performed according to the departmental dose-optimization program which includes automated exposure control, adjustment of the mA and/or kV according to patient size and/or use of iterative reconstruction technique. CONTRAST:  81m OMNIPAQUE IOHEXOL 350 MG/ML SOLN COMPARISON:  Chest CT 09/15/2021.  PET CT 10/12/2021. FINDINGS: Cardiovascular: No filling defects in the pulmonary arteries to suggest pulmonary emboli. Heart is normal size. Aorta is normal caliber. Mediastinum/Nodes: Esophagus appears thick walled in the upper to midthoracic region. Previously  seen right paratracheal adenopathy in the area much improved. No measurable enlarged mediastinal, hilar or axillary adenopathy. Lungs/Pleura: Previously seen right hilar mass improved, measuring 1.5 cm compared to 2 cm on prior PET CT and 6 cm on prior chest CT. Mild nodular airspace disease in the right upper lobe in the area of dense consolidation previously. Scarring in the right apex. Mild emphysema. Left upper lobe nodule posteriorly on image 40 of series 10 measures 5 mm, stable. No new or enlarging pulmonary nodules. No effusions. Upper Abdomen: Imaging into the upper abdomen demonstrates no acute findings. Musculoskeletal: Probable healing pathologic fractures in the right anterior 7th and 8th ribs are again seen, unchanged. No acute fracture. Review of the MIP images confirms the above findings. IMPRESSION: No evidence of pulmonary embolus. Improving mediastinal and right hilar mass/adenopathy. Thickened upper to midthoracic esophagus. 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 Aortic Atherosclerosis (ICD10-I70.0) and Emphysema (ICD10-J43.9). Electronically Signed   By: KRolm BaptiseM.D.   On: 12/14/2021 18:36   DG CHEST PORT 1 VIEW  Result Date: 12/19/2021 CLINICAL DATA:  Pneumonia EXAM: PORTABLE CHEST 1 VIEW COMPARISON:  Chest radiograph 12/19/2021 FINDINGS: There is a new right upper extremity PICC terminating at the cavoatrial junction. The cardiomediastinal silhouette is stable. There are patchy opacities in the medial right base, slightly increased from prior. There is no other focal airspace disease. There is no significant pleural effusion. There is no pneumothorax. There is no acute osseous abnormality. IMPRESSION: 1. New right upper extremity PICC with tip at the region of the cavoatrial junction. 2. Patchy opacities in the medial right base are slightly increased from prior and could reflect developing infection.  Electronically Signed   By: PValetta MoleM.D.   On: 12/19/2021 08:04   DG CHEST PORT 1 VIEW  Result Date: 12/18/2021 CLINICAL DATA:  Shortness of breath EXAM: PORTABLE CHEST 1 VIEW COMPARISON:  12/17/2021 FINDINGS: Lungs are hyperinflated as can be seen with COPD. Hazy left lower lobe airspace disease which may reflect atelectasis versus pneumonia unchanged compared  with the prior exam. No pleural effusion or pneumothorax. Heart and mediastinal contours are unremarkable. No acute osseous abnormality. IMPRESSION: Stable hazy left lower lobe airspace disease which may reflect atelectasis versus pneumonia. Electronically Signed   By: Kathreen Devoid M.D.   On: 12/18/2021 08:40   DG CHEST PORT 1 VIEW  Result Date: 12/17/2021 CLINICAL DATA:  Nausea and shortness of breath. EXAM: PORTABLE CHEST 1 VIEW COMPARISON:  Radiographs and chest CTA 3 days ago 12/14/2021, FINDINGS: Developing patchy opacity at the left lung base, new from prior exam. There is increase some bronchial thickening. Normal heart size with unchanged mediastinal contours. No significant pleural effusion or pneumothorax. IMPRESSION: 1. Developing patchy opacity at the left lung base over the last 3 days may be atelectasis or pneumonia. 2. Increased bronchial thickening. Electronically Signed   By: Keith Rake M.D.   On: 12/17/2021 17:35   DG Chest Port 1 View  Result Date: 12/14/2021 CLINICAL DATA:  Chest pain short of breath EXAM: PORTABLE CHEST 1 VIEW COMPARISON:  11/28/2021, CT 09/15/2021 FINDINGS: No acute airspace disease or effusion. Stable cardiomediastinal silhouette. Similar right suprahilar opacity corresponding to known history of lung cancer. No pneumothorax IMPRESSION: No active disease. Similar small right suprahilar opacity, corresponding to history of lung cancer. Electronically Signed   By: Donavan Foil M.D.   On: 12/14/2021 16:41   [place] EKG SITE RITE  Result Date: 12/18/2021 If Site Rite image not attached, placement  could not be confirmed due to current cardiac rhythm.   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  The patient is recovering from the effects of radiation.  ***  Plan:  ***   *** minutes of total time was spent for this patient encounter, including preparation, face-to-face counseling with the patient and coordination of care, physical exam, and documentation of the encounter. ____________________________________  Blair Promise, PhD, MD  This document serves as a record of services personally performed by Gery Pray, MD. It was created on his behalf by Roney Mans, a trained medical scribe. The creation of this record is based on the scribe's personal observations and the provider's statements to them. This document has been checked and approved by the attending provider.

## 2021-12-19 NOTE — Assessment & Plan Note (Addendum)
CT 1/26 with circumferential wall thickening of RUL bronchus with intraluminal debris and subsequent bronchial narrowing, RLL bronchial thickening with intraluminal debris and adjacent peribronchovascular opacities in RLL - infectious vs inflammatory Seen by pulm, concern for element of vocal cord dysfunction or radiation induced upper airway inflammation Aggressive reflux treatment Reflux precautions Oxygen or BiPAP as needed to keep saturations more than 90% and per respiratory distress. Patient completed 7 days of antibiotic therapy. Currently on trial of steroids, using IV steroids as she is n.p.o.  Will change to oral steroid once she is able to safely swallow. -We will change her tapering dose of prednisone after endoscopic procedure today. -We will change to oral Protonix after procedure today.

## 2021-12-19 NOTE — Assessment & Plan Note (Addendum)
Last true fever 1/23 granix per oncology Neutropenia resolved

## 2021-12-19 NOTE — Hospital Course (Addendum)
[name] is [name] 51 year old female with past medical history significant for small cell lung cancer with right hilar/suprahilar mass with occlusion of the right upper lobe bronchus right paratracheal/infrahilar/subcarinal lymphadenopathy diagnosed October 2022 currently on chemotherapy and radiation, COPD, tobacco use disorder who presented to [facility] ED on 1/20 with nausea/vomiting, shortness of breath, right-sided chest pain.  Currently on chemotherapy, last infusion on 12/06/2021.  Her nausea and vomiting has progressed and she has been unable to keep her medications down.  Patient denied fever, no chills, no headache, no diarrhea..  She was admitted with CT findings concerning for radiation esophagitis and developed CXR findings concerning for aspiration pneumonia.  Pulmonary was consulted due to progressive respiratory distress and thought her symptoms related to radiation induced upper airway inflammation and/or vocal cord dysfunction.  She's improved with steroids, antibiotics, and lasix from respiratory standpoint.  Being treated with PPI, H2 blocker, and sucralfate for her radiation esophagitis.  Barium swallow concerning for stricture, GI following.  Hospitalization complicated by pancytopenia, RUE DVT now on heparin.  See below for additional details

## 2021-12-19 NOTE — Consult Note (Signed)
Consultation Note Date: 12/19/2021   Patient Name: [name]  DOB: June 06, 1971  MRN: [id number]  Age / Sex: 51 y.o., female  PCP: [name], PA-C Referring Physician: Elodia Florence., *  Reason for Consultation: Establishing goals of care  HPI/Patient Profile: 51 y.o. 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  She was transferred to PICU due to increased work of breathing.  She continues to suffer from acute hypoxemic respiratory failure due to left lower lobe pneumonia.  She has been evaluated by and recommendation for continuation of antibiotics BiPAP.  She also has radiation-induced esophagitis.  She is reported she is not interested in further chemotherapy.  Palliative consulted for goals of care.  Clinical Assessment and Goals of Care: I met today with Ms. Mehlhaff.   I introduced palliative care as specialized medical care for people living with serious illness. It focuses on providing relief from the symptoms and stress of a serious illness. The goal is to improve quality of life for both the patient and the family.  We discussed clinical course as well as wishes moving forward in regard to advanced directives.  We discussed surrogate decision making.  Concepts specific to code status and rehospitalization discussed.  We discussed difference between a aggressive medical intervention path and a palliative, comfort focused care path.  Values and goals of care important to Ms. Kleiber were attempted to be elicited.  She states today that she is not interested in further chemotherapy for her cancer, however, she states that she wants to continue with full scope of treatment here in the hospital.  I  recommended she continue to consider what things are most important to her moving forward and we can continue to discuss what interventions are likely to get her closer to those goals.   Questions and concerns addressed.   PMT will continue to support holistically.   SUMMARY OF RECOMMENDATIONS   -Full code/full scope -She reports today that she does not think that she wants to pursue further chemotherapy.  We had discussion regarding long-term goals of care in light of this and need to consider what interventions are likely to allow her to meet her goals of getting out of the hospital and spend time with her family.  We also discussed consideration for limitations of care in light of the fact she has incurable small cell cancer.  Today, she reports needing time to consider further before making any decisions.  She desired to continue with plan for full scope/full code treatment. -I talked with her about surrogate decision making if she were to become confused and unable to speak for herself.  She tells me that her mother would be the best person to speak on her behalf.  We discussed about surrogate decision making in [place] if someone does not have paperwork naming surrogate.  At  this point, per [place] law, her surrogates would be joint decision-making between her mother, her father, and her 4 adult children. -Palliative to follow-up to continue conversation based upon clinical course.  Code Status/Advance Care Planning: Full code  Palliative Prophylaxis:  Bowel Regimen and Frequent Pain Assessment  Prognosis:  Unable to determine  Discharge Planning: To Be Determined      Primary Diagnoses: Present on Admission:  Nausea and vomiting  Chest pain  (Resolved) Lactic acidosis  Hypomagnesemia  Hypokalemia  Hyponatremia  Hyperglycemia  Leukopenia due to antineoplastic chemotherapy (HCC)  Pancytopenia (HCC)  Normocytic anemia  AKI (acute kidney injury) ([place])  Dehydration   I have  reviewed the medical record, interviewed the patient and family, and examined the patient. The following aspects are pertinent.  Past Medical History:  Diagnosis Date   Ankle sprain    left   Anxiety    treated for panic attacks in the past.   Asthma    COPD (chronic obstructive pulmonary disease) (HCC)    Depression    History of radiation therapy    Right lung- 09/26/21-11/13/21- Dr. Gery Pray   Irregular heart rate    Lung cancer ([place])    Renal disorder    kidney infections   Social History   Socioeconomic History   Marital status: Legally Separated    Spouse name: Not on file   Number of children: Not on file   Years of education: Not on file   Highest education level: Not on file  Occupational History   Not on file  Tobacco Use   Smoking status: Every Day    Packs/day: 0.50    Years: 30.00    Pack years: 15.00    Types: Cigarettes   Smokeless tobacco: Never  Vaping Use   Vaping Use: Never used  Substance and Sexual Activity   Alcohol use: Yes    Comment: rare   Drug use: Not Currently    Types: Marijuana    Comment: occ-   Sexual activity: Not on file  Other Topics Concern   Not on file  Social History Narrative   Not on file   Social Determinants of Health   Financial Resource Strain: Not on file  Food Insecurity: Not on file  Transportation Needs: Not on file  Physical Activity: Not on file  Stress: Not on file  Social Connections: Not on file   Family History  Problem Relation Age of Onset   Hypertension Mother    Hypertension Father    Heart disease Father    Scheduled Meds:  chlorhexidine  15 mL Mouth Rinse BID   Chlorhexidine Gluconate Cloth  6 each Topical Daily   dextromethorphan-guaiFENesin  1 tablet Oral BID   feeding supplement  1 Container Oral TID BM   mouth rinse  15 mL Mouth Rinse q12n4p   metoprolol tartrate  2.5 mg Intravenous Q8H   multivitamin with minerals  1 tablet Oral Daily   nicotine  21 mg Transdermal Daily    oxyCODONE  20 mg Oral Q12H   pantoprazole (PROTONIX) IV  40 mg Intravenous Q12H   potassium chloride  40 mEq Oral Q4H   sodium chloride flush  10-40 mL Intracatheter Q12H   Tbo-Filgrastim  300 mcg Subcutaneous Daily   Continuous Infusions:  sodium chloride 10 mL/hr at 12/19/21 0849   ceFEPime (MAXIPIME) IV Stopped (12/19/21 9622)   famotidine (PEPCID) IV Stopped (12/18/21 1245)   vancomycin Stopped (12/19/21 0843)   PRN Meds:.sodium chloride, acetaminophen **  OR** [DISCONTINUED] acetaminophen, guaiFENesin-dextromethorphan, HYDROmorphone (DILAUDID) injection, labetalol, levalbuterol, LORazepam, ondansetron (ZOFRAN) IV, oxyCODONE, prochlorperazine, sodium chloride flush Medications Prior to Admission:  Prior to Admission medications   Medication Sig Start Date End Date Taking? Authorizing Provider  albuterol (PROVENTIL HFA;VENTOLIN HFA) 108 (90 Base) MCG/ACT inhaler Inhale 2 puffs into the lungs every 6 (six) hours as needed for wheezing or shortness of breath. 03/13/17  Yes Hairston, Mandesia R, FNP  diphenhydramine-acetaminophen (TYLENOL PM) 25-500 MG TABS tablet Take 3 tablets by mouth at bedtime as needed (sleep).   Yes [provider]  magnesium oxide (MAG-OX) 400 (240 Mg) MG tablet Take 1 tablet (400 mg total) by mouth 3 (three) times daily. 10/30/21  Yes Curt Bears, MD  ondansetron (ZOFRAN ODT) 8 MG disintegrating tablet Dissolve 1 tablet (8 mg total) by mouth every 8 (eight) hours as needed for nausea or vomiting. 10/11/21  Yes Heilingoetter, Cassandra L, PA-C  oxyCODONE ER (XTAMPZA ER) 18 MG C12A Take 1 capsule by mouth 2 times daily. 11/06/21  Yes Gery Pray, MD  oxyCODONE-acetaminophen (PERCOCET/ROXICET) 5-325 MG tablet Take 1 tablet by mouth every 8 hours as needed for severe pain. 12/04/21  Yes Heilingoetter, Cassandra L, PA-C  potassium chloride 20 MEQ/15ML (10%) SOLN Take 15 mLs by mouth 2 times daily. 12/04/21  Yes Heilingoetter, Cassandra L, PA-C   Allergies   Allergen Reactions   Naproxen Nausea Only   Bactrim [Sulfamethoxazole-Trimethoprim] Nausea Only   Review of Systems  Constitutional:  Positive for fatigue.  Respiratory:  Positive for cough and shortness of breath.   Cardiovascular:  Positive for chest pain.  Gastrointestinal:  Positive for nausea.  Neurological:  Positive for weakness.  Psychiatric/Behavioral:  Positive for sleep disturbance.    Physical Exam General: Alert, awake, chronically ill-appearing.   HEENT: No bruits, no goiter, no JVD Heart: Tachycardic. No murmur appreciated. Lungs: Fair air movement, coarse throughout Abdomen: Soft, nontender, nondistended, positive bowel sounds.   Ext: No significant edema Skin: Warm and dry Neuro: Grossly intact, nonfocal.  Vital Signs: BP (!) 115/94    Pulse 100    Temp 97.8 F (36.6 C) (Axillary)    Resp (!) 21    Ht $R'5\' 8"'sf$  (1.727 m)    Wt 68.8 kg    LMP 04/24/2018    SpO2 98%    BMI 23.06 kg/m  Pain Scale: 0-10 POSS *See Group Information*: 1-Acceptable,Awake and alert Pain Score: 10-Worst pain ever   SpO2: SpO2: 98 % O2 Device:SpO2: 98 % O2 Flow Rate: .O2 Flow Rate (L/min): 10 L/min  IO: Intake/output summary:  Intake/Output Summary (Last 24 hours) at 12/19/2021 0850 Last data filed at 12/19/2021 0849 Gross per 24 hour  Intake 2148.96 ml  Output 1875 ml  Net 273.96 ml    LBM: Last BM Date: 12/16/21 Baseline Weight: Weight: 68.8 kg Most recent weight: Weight: 68.8 kg     Palliative Assessment/Data:   Flowsheet Rows    Flowsheet Row Most Recent Value  Intake Tab   Referral Department Hospitalist  Unit at Time of Referral ICU  Palliative Care Primary Diagnosis Cancer  Date Notified 12/17/21  Palliative Care Type New Palliative care  Reason for referral Clarify Goals of Care  Date of Admission 12/14/21  Date first seen by Palliative Care 12/18/21  # of days Palliative referral response time 1 Day(s)  # of days IP prior to Palliative referral 3  Clinical  Assessment   Palliative Performance Scale Score 40%  Psychosocial & Spiritual Assessment   Palliative Care  Outcomes   Patient/Family meeting held? Yes  Who was at the meeting? Patient  Palliative Care Outcomes Clarified goals of care       Time In: 1730 Time Out: 1850 Time Total: 80 Greater than 50%  of this time was spent counseling and coordinating care related to the above assessment and plan.  Signed by: Micheline Rough, MD   Please contact Palliative Medicine Team phone at [phone] for questions and concerns.  For individual provider: See Shea Evans

## 2021-12-19 NOTE — Assessment & Plan Note (Addendum)
Encouraged cessation.  Have nicotine patch.

## 2021-12-19 NOTE — Progress Notes (Incomplete)
°  Radiation Oncology         [phone] ________________________________  Patient Name: [name] MRN: [id number] DOB: 1971/10/23 Referring Physician: Peggye Pitt J Date of Service: 11/13/2021 [facility], [place]                                                        End Of Treatment Note  Diagnoses: R91.1-Solitary pulmonary nodule  Cancer Staging: Stage IVA (cT4, cN2, cM1a) Mass of the right lung  Intent: Curative  Radiation Treatment Dates: 09/26/2021 through 11/13/2021 Site Technique Total Dose (Gy) Dose per Fx (Gy) Completed Fx Beam Energies  Lung, Right: Lung_Rt 3D 60/60 2 30/30 6X, 10X   Narrative: The patient tolerated radiation therapy with some difficulty. She reports esophagitis and chest/throat pain from frequent coughing, fatigue, itching sensation on her back, mild skin irritation of the chest, frequent productive cough, and trouble swallowing that causes her difficulty getting any liquids or solids down. She also reports ongoing chronic tachycardia which was present prior to RT. On 11/11/21, the patient also presented to the ED for dehydration.   She is scheduled for her 3rd cycle of chemotherapy tomorrow.  Plan: The patient will follow-up with radiation oncology in one month. I advised her to discuss getting more regular IV fluids with her med/onc physician.  I also advised her to use lotion over RT skin irritation  ________________________________________________ -----------------------------------  Blair Promise, PhD, MD  This document serves as a record of services personally performed by Gery Pray, MD. It was created on his behalf by Roney Mans, a trained medical scribe. The creation of this record is based on the scribe's personal observations and the provider's statements to them. This document has been checked and approved by the attending provider.

## 2021-12-19 NOTE — Assessment & Plan Note (Addendum)
Completed cefepime.  All-time aspiration precautions.  Clinically improving.

## 2021-12-20 ENCOUNTER — Inpatient Hospital Stay (HOSPITAL_COMMUNITY): Payer: Medicaid Other

## 2021-12-20 ENCOUNTER — Ambulatory Visit
Admission: RE | Admit: 2021-12-20 | Discharge: 2021-12-20 | Disposition: A | Discharge status: Home / Self Care | Payer: Medicaid Other | Source: Ambulatory Visit | Attending: Radiation Oncology | Admitting: Radiation Oncology

## 2021-12-20 DIAGNOSIS — R0603 Acute respiratory distress: Secondary | ICD-10-CM | POA: Diagnosis not present

## 2021-12-20 DIAGNOSIS — I82629 Acute embolism and thrombosis of deep veins of unspecified upper extremity: Secondary | ICD-10-CM

## 2021-12-20 DIAGNOSIS — R609 Edema, unspecified: Secondary | ICD-10-CM | POA: Diagnosis not present

## 2021-12-20 DIAGNOSIS — R7989 Other specified abnormal findings of blood chemistry: Secondary | ICD-10-CM | POA: Diagnosis not present

## 2021-12-20 DIAGNOSIS — J9601 Acute respiratory failure with hypoxia: Secondary | ICD-10-CM

## 2021-12-20 HISTORY — DX: Personal history of irradiation: Z92.3

## 2021-12-20 LAB — CBC WITH DIFFERENTIAL/PLATELET
Abs Immature Granulocytes: 0.22 10*3/uL — ABNORMAL HIGH (ref 0.00–0.07)
Basophils Absolute: 0 10*3/uL (ref 0.0–0.1)
Basophils Relative: 1 %
Eosinophils Absolute: 0 10*3/uL (ref 0.0–0.5)
Eosinophils Relative: 0 %
HCT: 22.7 % — ABNORMAL LOW (ref 36.0–46.0)
Hemoglobin: 7.8 g/dL — ABNORMAL LOW (ref 12.0–15.0)
Immature Granulocytes: 7 %
Lymphocytes Relative: 10 %
Lymphs Abs: 0.3 10*3/uL — ABNORMAL LOW (ref 0.7–4.0)
MCH: 32.6 pg (ref 26.0–34.0)
MCHC: 34.4 g/dL (ref 30.0–36.0)
MCV: 95 fL (ref 80.0–100.0)
Monocytes Absolute: 1.3 10*3/uL — ABNORMAL HIGH (ref 0.1–1.0)
Monocytes Relative: 39 %
Neutro Abs: 1.4 10*3/uL — ABNORMAL LOW (ref 1.7–7.7)
Neutrophils Relative %: 43 %
Platelets: 41 10*3/uL — ABNORMAL LOW (ref 150–400)
RBC: 2.39 MIL/uL — ABNORMAL LOW (ref 3.87–5.11)
RDW: 15.6 % — ABNORMAL HIGH (ref 11.5–15.5)
WBC: 3.2 10*3/uL — ABNORMAL LOW (ref 4.0–10.5)
nRBC: 0 % (ref 0.0–0.2)

## 2021-12-20 LAB — COMPREHENSIVE METABOLIC PANEL
ALT: 27 U/L (ref 0–44)
AST: 20 U/L (ref 15–41)
Albumin: 2.7 g/dL — ABNORMAL LOW (ref 3.5–5.0)
Alkaline Phosphatase: 68 U/L (ref 38–126)
Anion gap: 7 (ref 5–15)
BUN: 20 mg/dL (ref 6–20)
CO2: 25 mmol/L (ref 22–32)
Calcium: 9.1 mg/dL (ref 8.9–10.3)
Chloride: 102 mmol/L (ref 98–111)
Creatinine, Ser: 0.87 mg/dL (ref 0.44–1.00)
GFR, Estimated: >60 mL/min (ref >60)
Glucose, Bld: 131 mg/dL — ABNORMAL HIGH (ref 70–99)
Potassium: 3.8 mmol/L (ref 3.5–5.1)
Sodium: 134 mmol/L — ABNORMAL LOW (ref 135–145)
Total Bilirubin: 0.9 mg/dL (ref 0.3–1.2)
Total Protein: 6.4 g/dL — ABNORMAL LOW (ref 6.5–8.1)

## 2021-12-20 LAB — MAGNESIUM: Magnesium: 1.4 mg/dL — ABNORMAL LOW (ref 1.7–2.4)

## 2021-12-20 LAB — BRAIN NATRIURETIC PEPTIDE: B Natriuretic Peptide: 203.8 pg/mL — ABNORMAL HIGH (ref 0.0–100.0)

## 2021-12-20 LAB — ECHOCARDIOGRAM COMPLETE
AR max vel: 1.89 cm2
AV Area VTI: 2.03 cm2
AV Area mean vel: 1.84 cm2
AV Mean grad: 3 mmHg
AV Peak grad: 6 mmHg
Ao pk vel: 1.22 m/s
Area-P 1/2: 3.77 cm2
Height: 68 in
S' Lateral: 3.6 cm
Weight: 2426.82 oz

## 2021-12-20 LAB — TSH: TSH: 0.457 u[IU]/mL (ref 0.350–4.500)

## 2021-12-20 LAB — LEGIONELLA PNEUMOPHILA SEROGP 1 UR AG: L. pneumophila Serogp 1 Ur Ag: NEGATIVE

## 2021-12-20 LAB — PHOSPHORUS: Phosphorus: 2 mg/dL — ABNORMAL LOW (ref 2.5–4.6)

## 2021-12-20 MED ORDER — SUCRALFATE 1 GM/10ML PO SUSP
1.0000 g | Freq: Three times a day (TID) | ORAL | Status: DC
  Administered 2021-12-20 – 2021-12-27 (×24): 1 g via ORAL
  Filled 2021-12-20 (×25): qty 10

## 2021-12-20 MED ORDER — METOPROLOL TARTRATE 25 MG PO TABS
12.5000 mg | ORAL_TABLET | Freq: Two times a day (BID) | ORAL | Status: AC
  Administered 2021-12-20 – 2021-12-25 (×8): 12.5 mg via ORAL
  Filled 2021-12-20 (×9): qty 1

## 2021-12-20 MED ORDER — POTASSIUM CHLORIDE 20 MEQ PO PACK
40.0000 meq | PACK | Freq: Once | ORAL | Status: AC
  Administered 2021-12-20: 40 meq via ORAL
  Filled 2021-12-20: qty 2

## 2021-12-20 MED ORDER — IOHEXOL 350 MG/ML SOLN
80.0000 mL | Freq: Once | INTRAVENOUS | Status: AC | PRN
  Administered 2021-12-20: 65 mL via INTRAVENOUS

## 2021-12-20 MED ORDER — K PHOS MONO-SOD PHOS DI & MONO 155-852-130 MG PO TABS
500.0000 mg | ORAL_TABLET | Freq: Two times a day (BID) | ORAL | Status: AC
  Administered 2021-12-20 (×2): 500 mg via ORAL
  Filled 2021-12-20 (×2): qty 2

## 2021-12-20 MED ORDER — MAGNESIUM SULFATE 4 GM/100ML IV SOLN
4.0000 g | Freq: Once | INTRAVENOUS | Status: AC
  Administered 2021-12-20: 4 g via INTRAVENOUS
  Filled 2021-12-20: qty 100

## 2021-12-20 MED ORDER — FUROSEMIDE 10 MG/ML IJ SOLN
40.0000 mg | Freq: Once | INTRAMUSCULAR | Status: AC
  Administered 2021-12-20: 40 mg via INTRAVENOUS
  Filled 2021-12-20: qty 4

## 2021-12-20 MED ORDER — HEPARIN (PORCINE) 25000 UT/250ML-% IV SOLN
1300.0000 [IU]/h | INTRAVENOUS | Status: DC
  Administered 2021-12-20: 1100 [IU]/h via INTRAVENOUS
  Filled 2021-12-20: qty 250

## 2021-12-20 MED ORDER — METOPROLOL TARTRATE 5 MG/5ML IV SOLN: 2.5000 mg | INTRAVENOUS | Status: DC | PRN

## 2021-12-20 MED ORDER — ALPRAZOLAM 0.25 MG PO TABS
0.2500 mg | ORAL_TABLET | Freq: Three times a day (TID) | ORAL | Status: AC | PRN
  Administered 2021-12-21 – 2021-12-22 (×4): 0.25 mg via ORAL
  Filled 2021-12-20 (×4): qty 1

## 2021-12-20 NOTE — Assessment & Plan Note (Addendum)
PICC line related DVT. Discontinue PICC line.  Discontinue heparin. Eliquis for 3 months.  Platelets are more than 70,000. Oncology will schedule follow-up.

## 2021-12-20 NOTE — Progress Notes (Signed)
HR increased to 150 and pt desatted to 85%. O2 increased to 10L. PRN neb given. HR returned to 115. Will continue to monitor.

## 2021-12-20 NOTE — Consult Note (Signed)
NAME:  [name], MRN:  [id number], DOB:  Sep 10, 1971, LOS: 6 ADMISSION DATE:  12/14/2021, CONSULTATION DATE:  1/24 REFERRING MD:  [place]  , CHIEF COMPLAINT: acute respiratory failure   History of Present Illness:  51 yof w/ hx per below. Last Chemo w/ Cisplatin and Etoposide 1/10,1/11 and 1/12. She is s/p completion of 3 rounds XRT (~2 wks prior).   Brought in by EMS 1/20 w/ cc: shortness of breath and chest pain. Completed chemo about 8d prior w/ post-chemo course c/b nausea, vomiting and poor po intake. 3d PTA began to notice Right sided CP and increasing shortness of breath. No fevers, chills, HA, sick exposures.  ED workup: pancytopenic.  CXR w/out infiltrates, CT chest negative for Pulmonary emboli, admitted w/ working dx of N/V dehydration w/ associated AKI as well as Pancytopenia, also mild elevated lactic acid   See hospital course below.  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 Completed chemo about 8d prior w/ post-chemo course c/b nausea, vomiting and poor po intake. 3d PTA began to notice Right sided CP and increasing shortness of breath. No fevers, chills, HA, sick exposures.  ED workup: pancytopenic, CXR w/out  infiltrates, CT chest negative for Pulmonary emboli, admitted w/ working dx of N/V dehydration w/ associated AKI as well as Pancytopenia. Treated w/ Compazine, IVFs and cl liq diet. Initial WBC 0.3, hgb 7, PLT 6; received both PRBC and PLT transfusion  1/21 lactic acidosis resolved. Onc consulted for Pancytopenia. Electrolytes replaced. Getting pain meds. Started on Filgrastim by ONC. W/ instructions to  discontinue if ANC count greater than 5000 or ANC count greater than 1000 on 2 consecutive days. Hgb down to 4.8 (got total 2 u PRBC)  1/22 diet advanced to full liq. Got another unit PLTs. Had some hemoptysis w/ cough  1/23 new fever, rapid response called for increased WOB and tachycardia. Pt anxious. PCXR w/ possible LL airspace disease. Vanc and cefepime started.  1/24 remained tachycardic, still having CP and shoulder pain, still having nausea but no emesis. Developed progressive respiratory distress w/ loud audible expiratory wheezing. Pulse oximetry did not drop significantly however O2 was rapidly titrated up w/ little improvement in WOB. PCCM called emergently to bedside. Marked Upper airway expiratory wheezing noted w/ significant reduction during phonation efforts and when reassured. Placed On BIPAP> stat CXR obtained. Got IV lasix. PLTs down to 6K Quickly able to come off BiPAP after reassurance, lasix, PRN anti-HTN.  Interim History / Subjective:  Brighter this morning. Coughing some. Feels better.  Objective   Blood pressure (!) 143/97, pulse (!) 116, temperature 97.7 F (36.5 C), temperature source Axillary, resp. rate 17, height 5\' 8"  (1.727 m), weight 68.8 kg, last menstrual period 04/24/2018, SpO2 99 %.    FiO2 (%):  [36 %-60 %]  36 %   Intake/Output Summary (Last 24 hours) at 12/20/2021 6659 Last data filed at 12/20/2021 9357 Gross per 24 hour  Intake 1035.77 ml  Output 2600 ml  Net -1564.23 ml    Filed Weights   12/15/21 0117  Weight: 68.8 kg    Examination: General: 51  year old female on salter w/ improved WOB  Lungs: NWOB Cardiovascular: RRR w/out MRG Abdomen: soft not tender  Extremities: warm trace LE edema  Neuro: awake and oriented   Resolved Hospital Problem list   Lactic acidosis  AKI Dehydration   Assessment & Plan:  Principal Problem:   Acute respiratory failure (HCC) Active Problems:   Malignant neoplasm of right upper lobe of lung (HCC)   Goals of care, counseling/discussion   Dehydration   Chemotherapy induced nausea and vomiting   Chest pain   Hypomagnesemia   Hypokalemia   Hyponatremia   Hyperglycemia   Leukopenia due to antineoplastic chemotherapy (HCC)   Pancytopenia (HCC)   Normocytic anemia   AKI (acute kidney injury) ([place])   Generalized weakness   Neutropenic fever (HCC)   Pneumonia   Aspiration pneumonia (HCC)   Radiation-induced esophagitis   Hypophosphatemia   Electrolyte abnormality   Cancer related pain   Tobacco abuse   Sinus tachycardia  Acute respiratory failure. Multifactorial. Does have what appears to be LLL aspiration PNA but has significant upper airway burden as evidenced by upper airway expiratory wheezing that varies in intensity w/ phonation or anxiety reduction raising concern for element of vocal cord dysfxn vs simply radiation induced upper airway inflammation (as also has radiation induced esophagitis). CXR unchanged 1/25. Plan Supplemental oxygen  Reflux precautions (HOB > 30 degrees) Aggressive reflux Rx (see below) Day 4  cefepime, stop vancomycin 1/25 as MRSA swab negative BIPAP PRN Vocal rest Anxiety reduction  Pulse ox  Continue diuresis, continue diuresis with IV lasix as kidney function tolerates  Radiation induced esophagitis ->think this may also be contributing to her chest pain  Plan PPI BID and H2 blockade Reflux precautions  Antiemetics (on-going N/V will only make this worse)   Anxiety (situational)->plays a role in symptom burden here Plan Supportive  care  Pancytopenia 2/2 chemo: Improving after granix Plts transfused 1/24 Plan  Cont serial cbcs Transfuse for hgb < 7, or if PLTs < 10 K or develops active bleeding   Fluid and Electrolyte imbalance: hypomagnesemia  Plan Replace and recheck as indicated  Metastatic small cell lung cancer w/ cancer related pain  Plan F/u per onc  Treat pain  Palliative care consult  Goals of care Discussed life support/CPR ICU care with the patient and then later her mother on 1/24. At this point she would want full aggressive care. I did tell her mother and daughter that on-going discussion of goals of care are very important w/ people w/ advanced malignancy. Especially as pt indicating that she would NOT want chemo in future to her mom although I'm not sure if this is her final decision. Re-iterated her desire to stop on 1/25, palliative care consult ordered.  PCCM will sign off.  Best Practice (right click and "Reselect all SmartList Selections" daily)   Per Primary  Labs   CBC: Recent Labs  Lab 12/14/21 1626 12/15/21 0307 12/16/21 0304 12/16/21 2154 12/17/21 1701 12/18/21 0237 12/18/21 2140 12/19/21 0535 12/19/21 1548 12/20/21 0435  WBC 0.3*   < > 0.2*   < > 0.3* 0.8* 1.5* 1.0*  --  3.2*  NEUTROABS 0.0*  --  0.0*  --   --  0.2*  --  0.4*  --  1.4*  HGB 7.0*   < > 9.0*   < > 8.2* 8.6* 7.9* 7.3* 8.4* 7.8*  HCT 19.5*   < > 24.8*   < > 23.5* 24.5* 23.9* 21.4* 24.4* 22.7*  MCV 95.6   < > 89.5   < > 91.8 92.5 95.2 95.1  --  95.0  PLT 6*   < > 9*   < > 8* 6* 87* 48*  --  41*   < > = values in this interval not displayed.     Basic Metabolic Panel: Recent Labs  Lab 12/15/21 0307 12/16/21 0304 12/17/21 0321 12/17/21 1701 12/18/21 0237 12/18/21 1208 12/19/21 0535 12/20/21 0435  NA 132* 131* 133* 131* 134* 135 136 134*  K 2.9* 2.7* 3.1* 3.7 4.2 3.7 3.2* 3.8  CL 104 105 103 103 107 104 102 102  CO2 19* 18* 20* 20* 19* 21* 26 25  GLUCOSE 116* 124* 119* 127* 113* 136* 114* 131*   BUN 16 12 8 8 10 12 14 20   CREATININE 1.23* 0.94 1.02* 0.79 0.84 0.69 0.86 0.87  CALCIUM 8.0* 7.7* 8.2* 8.0* 8.3* 8.4* 8.5* 9.1  MG 1.7 1.0* 1.5*  --  1.5*  --  1.7 1.4*  PHOS 2.0*  --  1.5*  --  2.4*  --  2.8 2.0*    GFR: Estimated Creatinine Clearance: 78 mL/min (by C-G formula based on SCr of 0.87 mg/dL). Recent Labs  Lab 12/14/21 1621 12/14/21 1626 12/14/21 1826 12/14/21 2347 12/15/21 0307 12/16/21 0304 12/18/21 0237 12/18/21 2140 12/19/21 0535 12/20/21 0435  WBC  --    < >  --   --  0.2*   < > 0.8* 1.5* 1.0* 3.2*  LATICACIDVEN 2.9*  --  2.1* 0.9 1.6  --   --   --   --   --    < > = values in this interval not displayed.     Liver Function Tests: Recent Labs  Lab 12/14/21 1626 12/15/21 0307 12/17/21 1701 12/20/21 0435  AST 17 14* 11* 20  ALT 13 11 12 27   ALKPHOS 76 62 59 68  BILITOT 0.9 0.9 1.0 0.9  PROT 7.4 6.0* 6.7 6.4*  ALBUMIN 3.5 2.9* 2.8* 2.7*    No results for input(s): LIPASE, AMYLASE in the last 168 hours. No results for input(s): AMMONIA in the last 168 hours.  ABG    Component Value Date/Time   HCO3 16.0 (L) 01/14/2008 1202   TCO2 17 01/14/2008 1202   ACIDBASEDEF 7.0 (H) 01/14/2008 1202      Coagulation Profile: Recent Labs  Lab 12/14/21 1626  INR 0.9     Cardiac Enzymes: No results for input(s): CKTOTAL, CKMB, CKMBINDEX, TROPONINI in the last 168 hours.  HbA1C: No results found for: HGBA1C  CBG: No results for input(s): GLUCAP in the last 168 hours.  Review of Systems:   n/a   Past Medical History:  She,  has a past medical history of Ankle sprain, Anxiety, Asthma, COPD (chronic obstructive pulmonary disease) ([place]), Depression, History of radiation therapy, Irregular heart rate, Lung cancer ([place]), and Renal disorder.   Surgical History:   Past Surgical History:  Procedure Laterality Date   APPENDECTOMY     BRONCHIAL BIOPSY  09/21/2021   Procedure: BRONCHIAL BIOPSIES;  Surgeon: Collene Gobble, MD;  Location: [facility]  ENDOSCOPY;  Service: Pulmonary;;   BRONCHIAL BRUSHINGS  09/21/2021   Procedure:  BRONCHIAL BRUSHINGS;  Surgeon: Collene Gobble, MD;  Location: [facility] ENDOSCOPY;  Service: Pulmonary;;   BRONCHIAL NEEDLE ASPIRATION BIOPSY  09/21/2021   Procedure: BRONCHIAL NEEDLE ASPIRATION BIOPSIES;  Surgeon: Collene Gobble, MD;  Location: [facility] ENDOSCOPY;  Service: Pulmonary;;   LUMBAR LAMINECTOMY N/A 05/13/2018   Procedure: LEFT LUMBAR FOUR-FIVE MICRODISCECTOMY;  Surgeon: Marybelle Killings, MD;  Location: [place];  Service: Orthopedics;  Laterality: N/A;   TUBAL LIGATION     TUBAL LIGATION  1998   VIDEO BRONCHOSCOPY WITH ENDOBRONCHIAL ULTRASOUND N/A 09/21/2021   Procedure: VIDEO BRONCHOSCOPY WITH ENDOBRONCHIAL ULTRASOUND;  Surgeon: Collene Gobble, MD;  Location: [place] ENDOSCOPY;  Service: Pulmonary;  Laterality: N/A;     Social History:   reports that she has been smoking cigarettes. She has a 15.00 pack-year smoking history. She has never used smokeless tobacco. She reports current alcohol use. She reports that she does not currently use drugs after having used the following drugs: Marijuana.   Family History:  Her family history includes Heart disease in her father; Hypertension in her father and mother.   Allergies Allergies  Allergen Reactions   Naproxen Nausea Only   Bactrim [Sulfamethoxazole-Trimethoprim] Nausea Only     Home Medications  Prior to Admission medications   Medication Sig Start Date End Date Taking? Authorizing Provider  albuterol (PROVENTIL HFA;VENTOLIN HFA) 108 (90 Base) MCG/ACT inhaler Inhale 2 puffs into the lungs every 6 (six) hours as needed for wheezing or shortness of breath. 03/13/17  Yes Hairston, Mandesia R, FNP  diphenhydramine-acetaminophen (TYLENOL PM) 25-500 MG TABS tablet Take 3 tablets by mouth at bedtime as needed (sleep).   Yes [provider]  magnesium oxide (MAG-OX) 400 (240 Mg) MG tablet Take 1 tablet (400 mg total) by mouth 3 (three) times daily. 10/30/21  Yes  Curt Bears, MD  ondansetron (ZOFRAN ODT) 8 MG disintegrating tablet Dissolve 1 tablet (8 mg total) by mouth every 8 (eight) hours as needed for nausea or vomiting. 10/11/21  Yes Heilingoetter, Cassandra L, PA-C  oxyCODONE ER (XTAMPZA ER) 18 MG C12A Take 1 capsule by mouth 2 times daily. 11/06/21  Yes Gery Pray, MD  oxyCODONE-acetaminophen (PERCOCET/ROXICET) 5-325 MG tablet Take 1 tablet by mouth every 8 hours as needed for severe pain. 12/04/21  Yes Heilingoetter, Cassandra L, PA-C  potassium chloride 20 MEQ/15ML (10%) SOLN Take 15 mLs by mouth 2 times daily. 12/04/21  Yes Heilingoetter, Cassandra L, PA-C     Critical care time: n/a  Lanier Clam, MD [place] for contact info

## 2021-12-20 NOTE — Progress Notes (Signed)
Patient requested to get dilaudid instead of her scheduled oxycontin as she feels it helps better with her pain and additionally helps with the coughing. MD notified and approved use with a lower pain scale. Oncology to be consulted tomorrow regarding need for change in pain regimen.

## 2021-12-20 NOTE — Progress Notes (Signed)
RUE venous duplex has been completed.  Preliminary findings given to Dr. Florene Glen.  Results can be found under chart review under CV PROC. 12/20/2021 2:04 PM Kennice Finnie RVT, RDMS

## 2021-12-20 NOTE — Progress Notes (Signed)
[facility] ADULT ICU REPLACEMENT PROTOCOL   The patient does apply for the [facility] Adult ICU Electrolyte Replacment Protocol based on the criteria listed below:   1.Exclusion criteria: TCTS patients, ECMO patients, and Dialysis patients 2. Is GFR >/= 30 ml/min? Yes.    Patient's GFR today is >60 3. Is SCr </= 2? Yes.   Patient's SCr is 0.69 mg/dL 4. Did SCr increase >/= 0.5 in 24 hours? No. 5.Pt's weight >40kg  Yes.   6. Abnormal electrolyte(s): mag 1.4  7. Electrolytes replaced per protocol 8.  Call MD STAT for K+ </= 2.5, Phos </= 1, or Mag </= 1 Physician:    Ronda Fairly A 12/20/2021 5:51 AM

## 2021-12-20 NOTE — Progress Notes (Signed)
Elink notified of HR 130s sustained. Orders received for PO metoprolol with PRN IV.

## 2021-12-20 NOTE — Progress Notes (Signed)
ANTICOAGULATION CONSULT NOTE - Initial Consult  Pharmacy Consult for Heparin Indication: DVT  Allergies  Allergen Reactions   Naproxen Nausea Only   Bactrim [Sulfamethoxazole-Trimethoprim] Nausea Only    Patient Measurements: Height: 5\' 8"  (172.7 cm) Weight: 68.8 kg (151 lb 10.8 oz) IBW/kg (Calculated) : 63.9 Heparin Dosing Weight: TBW  Vital Signs: Temp: 99 F (37.2 C) (01/26 1544) Temp Source: Oral (01/26 1544) BP: 120/58 (01/26 1600) Pulse Rate: 120 (01/26 1600)  Labs: Recent Labs    12/18/21 1208 12/18/21 2140 12/19/21 0535 12/19/21 1548 12/20/21 0435  HGB  --  7.9* 7.3* 8.4* 7.8*  HCT  --  23.9* 21.4* 24.4* 22.7*  PLT  --  87* 48*  --  41*  CREATININE 0.69  --  0.86  --  0.87    Estimated Creatinine Clearance: 78 mL/min (by C-G formula based on SCr of 0.87 mg/dL).   Medical History: Past Medical History:  Diagnosis Date   Ankle sprain    left   Anxiety    treated for panic attacks in the past.   Asthma    COPD (chronic obstructive pulmonary disease) ([place])    Depression    History of radiation therapy    Right lung- 09/26/21-11/13/21- Dr. Gery Pray   Irregular heart rate    Lung cancer [facility])    Renal disorder    kidney infections    Medications:  Infusions:   sodium chloride Stopped (12/19/21 1435)   ceFEPime (MAXIPIME) IV Stopped (12/20/21 1412)   famotidine (PEPCID) IV Stopped (12/20/21 1325)   promethazine (PHENERGAN) injection (IM or IVPB) 6.25 mg (12/20/21 1621)    Assessment: [name] admtted on 1/20 with N/V, respiratory failure.  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  Pharmacy is now consulted to dose Heparin (no bolus, low goal per Dr Florene Glen) for acute upper extremity DVT in the setting of PICC line.   Oncology is consulted and following for pancytopenia related to chemo.  Per [facility] note oncology is "ok with anticoagulation without bolus." No recent anticoagulation.  Granix given 1/21- 1/25.  CBC:  Hgb  remains low at 7.8, Plt decreased to 41k  Goal of Therapy:  Heparin level 0.3-0.5 units/ml Monitor platelets by anticoagulation protocol: Yes   Plan:  NO bolus Start heparin infusion at 1100 units/hr Check anti-Xa level in 6 hours and daily while on heparin.  Note low goal range 0.3-0.5 units/ml.  Check daily CBC - continue to monitor H&H and platelets   Gretta Arab PharmD, BCPS Clinical Pharmacist [facility] main pharmacy [phone] 12/20/2021 4:59 PM

## 2021-12-20 NOTE — Progress Notes (Incomplete)
Pt called out for nausea medicine and had episode of vomiting brown/mucous emesis while waiting for phenergan pill to be tubed up from pharmacy.

## 2021-12-20 NOTE — Progress Notes (Signed)
eLink Physician-Brief Progress Note Patient Name: [name] DOB: 06-03-1971 MRN: [id number]   Date of Service  12/20/2021  HPI/Events of Note  Patient with resting sinus tachycardia, no distress when viewed via camera, CTA chest and echocardiogram done today are both unremarkable, she is on PRN iv Metoprolol, and PRN iv Lorazepam respectively.  eICU Interventions  TSH check ordered, Metoprolol 12.5 mg po BID ordered, Xanax 0.25 mg po TID PRN ordered, PRN iv Metoprolol changed to 2.5 mg iv Q 4 hours PRN heart rate > 115 with hold parameters for BP.        Kerry Kass Lynkin Saini 12/20/2021, 7:55 PM

## 2021-12-20 NOTE — Progress Notes (Signed)
Echocardiogram 2D Echocardiogram has been performed.  Arlyss Gandy 12/20/2021, 1:53 PM

## 2021-12-20 NOTE — Plan of Care (Signed)
°  Problem: Nutrition: Goal: Adequate nutrition will be maintained Outcome: Progressing   Problem: Clinical Measurements: Goal: Ability to maintain clinical measurements within normal limits will improve Outcome: Not Progressing Goal: Cardiovascular complication will be avoided Outcome: Not Progressing   Problem: Pain Managment: Goal: General experience of comfort will improve Outcome: Not Progressing

## 2021-12-20 NOTE — Progress Notes (Addendum)
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  Currently on chemotherapy, last infusion on 12/06/2021.  Her nausea and vomiting has progressed and she has been unable to keep her medications down.  Patient denied fever, no chills, no headache, no diarrhea..   In the ED, temperature 98.4 F, HR 145, RR 18, BP 104/30, SPO2 100% on room air.  Sodium 133, potassium 3.7, chloride 102, CO2 16, glucose 145, BUN 22, creatinine 1.64, AST 17, ALT 13, total bilirubin 0.9.  WBC 0.3, hemoglobin 7.0, platelets 6.  High sensitivity troponin 3, <2.  Lactic acid 2.9.  Chest x-ray with no active cardiopulmonary disease process, similar small right suprahilar opacity.  CT angiogram chest with no evidence of pulmonary embolism, improving mediastinal/right hilar mass/adenopathy.  Oncology was consulted.  TRH consulted for further evaluation and management of intractable nausea/vomiting, pancytopenia.    Assessment & Plan:   Principal Problem:   Acute respiratory failure (HCC) Active Problems:   Pneumonia   Aspiration pneumonia (HCC)   Neutropenic fever ([place])   Chemotherapy induced nausea and vomiting   Radiation-induced esophagitis   Pancytopenia (HCC)   Sinus tachycardia   Hypomagnesemia   Hypokalemia   Hypophosphatemia   Electrolyte abnormality   DVT of upper extremity (deep vein thrombosis) (HCC)   Hyponatremia   AKI (acute kidney  injury) ([place])   Malignant neoplasm of right upper lobe of lung ([name])   Cancer related pain   Tobacco abuse   Goals of care, counseling/discussion   Dehydration   Chest pain   Hyperglycemia   Leukopenia due to antineoplastic chemotherapy (HCC)   Normocytic anemia   Generalized weakness   * Acute respiratory failure (HCC) Currently on 6 L [place]  CXR with patchy opacities in medial R base, developing infection? CT on 1/20 with thickened upper to mid thoracic esophagus, ? Radiation esophagitis Significant upper airway wheezing on exam, appreciate pulmonary assistance -> concern for element of vocal cord dysfunction or radiation induced upper airway inflammation Aggressive reflux treatment Reflux precautions bipap prn Lasix as tolerated per pulm  Will trial steroids Continue Cefepime Seems to be improving with lasix/steroids/abx   Aspiration pneumonia (HCC) Continue cefepime, follow   Neutropenic fever (HCC) Last true fever 1/23 granix per oncology ANC 1.4 today, improving, follow Requested oncology to see  Radiation-induced esophagitis PPI H2 blocker sucralfate relfux precautions  Chemotherapy induced nausea and vomiting Continue antiemetics  Sinus tachycardia 2/2 above Started on low dose scheduled metop on 1/24 Net positive ~12 L Echo with EF 60-65%, RVSF normal (see report) Suspect driven by respiratory issues above   Pancytopenia ([name])- (present on admission) Related to chemo Fluctuating and persistent Continue granix Trend counts, transfuse as appropriate  DVT of upper extremity (deep vein thrombosis) ([place]) Related to PICC Get IV access, then will remove PICC  Not great anticoagulation candidate at this time due to thrombocytopenia Already had negative CT PE study this admission, but given DVT and tachy, will consider repeating  Addendum, discussed with oncology,  ok with anticoagulation without bolus - will start heparin and follow counts closely - leave  PICC for now  Electrolyte abnormality Replace potassium, magnesium, phosphorus as needed  AKI (acute kidney injury) ([name])- (present on admission) resolved  Hyponatremia- (present on admission) resolved  Cancer related pain Oxycontin, oxycodone, dilaudid for breakthrough  Malignant neoplasm of right upper lobe of lung ([name])- (present on admission) Small Cell Lung Cancer Follows with Dr. Julien Nordmann outpatient Last chemo 12/06/2021  Tobacco abuse Encouraged cessation  Goals of care, counseling/discussion Per prior notes, patient contemplating stopping chemo Appreciate palliative care assistance (1/24 note)   DVT prophylaxis: SCD Code Status: full Family Communication: mother Disposition:   Status is: Inpatient  Remains inpatient appropriate because: need for further inpatient care, goc discussions, IV steroids, IV abx       Consultants:  PCCM Oncology Palliative care  Procedures:  none  Antimicrobials:  Anti-infectives (From admission, onward)    Start     Dose/Rate Route Frequency Ordered Stop   12/18/21 0800  vancomycin (VANCOREADY) IVPB 1000 mg/200 mL  Status:  Discontinued        1,000 mg 200 mL/hr over 60 Minutes Intravenous Every 12 hours 12/17/21 1859 12/19/21 0912   12/17/21 2200  ceFEPIme (MAXIPIME) 2 g in sodium chloride 0.9 % 100 mL IVPB  Status:  Discontinued        2 g 200 mL/hr over 30 Minutes Intravenous Every 8 hours 12/17/21 1750 12/17/21 1756   12/17/21 1915  vancomycin (VANCOREADY) IVPB 1500 mg/300 mL        1,500 mg 150 mL/hr over 120 Minutes Intravenous  Once 12/17/21 1800 12/17/21 2057   12/17/21 1845  ceFEPIme (MAXIPIME) 2 g in sodium chloride 0.9 % 100 mL IVPB        2 g 200 mL/hr over 30 Minutes Intravenous Every 8 hours 12/17/21 1756         Subjective: Appears more comfortable today  Objective: Vitals:   12/20/21 1000 12/20/21 1217 12/20/21 1544 12/20/21 1600  BP:  (!) 143/94 134/85 (!) 120/58  Pulse: (!) 117 (!) 117 (!)  117 (!) 120  Resp: 16 17 (!) 26 18  Temp:  98.3 F (36.8 C) 99 F (37.2 C)   TempSrc:  Oral Oral   SpO2: 96% 94% 100% 97%  Weight:      Height:        Intake/Output Summary (Last 24 hours) at 12/20/2021 1643 Last data filed at 12/20/2021 1623 Gross per 24 hour  Intake 990.36 ml  Output 3400 ml  Net -2409.64 ml   Filed Weights   12/15/21 0117  Weight: 68.8 kg    Examination:  General: No acute distress. Cardiovascular: RRR Lungs: unlabored Abdomen: Soft, nontender, nondistended  Neurological: Alert and oriented 3. Moves all extremities 4. Cranial nerves II through XII grossly intact. Skin: Warm and dry. No rashes or lesions. Extremities: No clubbing or cyanosis. No edema.    Data Reviewed: I have personally reviewed following labs and imaging studies  CBC: Recent Labs  Lab 12/14/21 1626 12/15/21 0307 12/16/21 0304 12/16/21 2154 12/17/21 1701 12/18/21 0237 12/18/21 2140 12/19/21 0535 12/19/21 1548 12/20/21 0435  WBC 0.3*   < > 0.2*   < > 0.3* 0.8* 1.5* 1.0*  --  3.2*  NEUTROABS 0.0*  --  0.0*  --   --  0.2*  --  0.4*  --  1.4*  HGB 7.0*   < > 9.0*   < > 8.2* 8.6* 7.9* 7.3* 8.4*  7.8*  HCT 19.5*   < > 24.8*   < > 23.5* 24.5* 23.9* 21.4* 24.4* 22.7*  MCV 95.6   < > 89.5   < > 91.8 92.5 95.2 95.1  --  95.0  PLT 6*   < > 9*   < > 8* 6* 87* 48*  --  41*   < > = values in this interval not displayed.    Basic Metabolic Panel: Recent Labs  Lab 12/15/21 0307 12/16/21 0304 12/17/21 0321 12/17/21 1701 12/18/21 0237 12/18/21 1208 12/19/21 0535 12/20/21 0435  NA 132* 131* 133* 131* 134* 135 136 134*  K 2.9* 2.7* 3.1* 3.7 4.2 3.7 3.2* 3.8  CL 104 105 103 103 107 104 102 102  CO2 19* 18* 20* 20* 19* 21* 26 25  GLUCOSE 116* 124* 119* 127* 113* 136* 114* 131*  BUN 16 12 8 8 10 12 14 20   CREATININE 1.23* 0.94 1.02* 0.79 0.84 0.69 0.86 0.87  CALCIUM 8.0* 7.7* 8.2* 8.0* 8.3* 8.4* 8.5* 9.1  MG 1.7 1.0* 1.5*  --  1.5*  --  1.7 1.4*  PHOS 2.0*  --  1.5*  --  2.4*   --  2.8 2.0*    GFR: Estimated Creatinine Clearance: 78 mL/min (by C-G formula based on SCr of 0.87 mg/dL).  Liver Function Tests: Recent Labs  Lab 12/14/21 1626 12/15/21 0307 12/17/21 1701 12/20/21 0435  AST 17 14* 11* 20  ALT 13 11 12 27   ALKPHOS 76 62 59 68  BILITOT 0.9 0.9 1.0 0.9  PROT 7.4 6.0* 6.7 6.4*  ALBUMIN 3.5 2.9* 2.8* 2.7*    CBG: No results for input(s): GLUCAP in the last 168 hours.   Recent Results (from the past 240 hour(s))  Culture, blood (routine x 2)     Status: None   Collection Time: 12/14/21  4:26 PM   Specimen: BLOOD RIGHT HAND  Result Value Ref Range Status   Specimen Description   Final    BLOOD RIGHT HAND Performed at [facility] [address], [place], [place] [address]    Special Requests   Final    BOTTLES DRAWN AEROBIC AND ANAEROBIC Blood Culture results may not be optimal due to an inadequate volume of blood received in culture bottles Performed at [facility] [address], [place], [place] [address]    Culture   Final    NO GROWTH 5 DAYS Performed at [facility], [facility] [address], [place], [place] [address]    Report Status 12/19/2021 FINAL  Final  Culture, blood (routine x 2)     Status: None   Collection Time: 12/14/21  4:31 PM   Specimen: Left Antecubital; Blood  Result Value Ref Range Status   Specimen Description   Final    LEFT ANTECUBITAL Performed at [facility] [address] [place], [place] [address]    Special Requests   Final    BOTTLES DRAWN AEROBIC AND ANAEROBIC Blood Culture adequate volume Performed at [facility] [address], [place], [place] [address]    Culture   Final    NO GROWTH 5 DAYS Performed at [facility], [place] [address] [place],  [address]    Report Status 12/19/2021 FINAL  Final  Resp Panel by RT-PCR (Flu Yilin Weedon&B, Covid) Nasopharyngeal Swab     Status: None   Collection Time: 12/14/21   4:38 PM   Specimen: Nasopharyngeal Swab; Nasopharyngeal(NP) swabs in  vial transport medium  Result Value Ref Range Status   SARS Coronavirus 2 by RT PCR NEGATIVE NEGATIVE Final    Comment: (NOTE) SARS-CoV-2 target nucleic acids are NOT DETECTED.  The SARS-CoV-2 RNA is generally detectable in upper respiratory specimens during the acute phase of infection. The lowest concentration of SARS-CoV-2 viral copies this assay can detect is 138 copies/mL. Amand Lemoine negative result does not preclude SARS-Cov-2 infection and should not be used as the sole basis for treatment or other patient management decisions. Ralf Konopka negative result may occur with  improper specimen collection/handling, submission of specimen other than nasopharyngeal swab, presence of viral mutation(s) within the areas targeted by this assay, and inadequate number of viral copies(<138 copies/mL). Sarabelle Genson negative result must be combined with clinical observations, patient history, and epidemiological information. The expected result is Negative.  Fact Sheet for Patients:  EntrepreneurPulse.com.au  Fact Sheet for Healthcare Providers:  IncredibleEmployment.be  This test is no t yet approved or cleared by the [place] FDA and  has been authorized for detection and/or diagnosis of SARS-CoV-2 by FDA under an Emergency Use Authorization (EUA). This EUA will remain  in effect (meaning this test can be used) for the duration of the COVID-19 declaration under Section 564(b)(1) of the Act, 21 U.S.C.section 360bbb-3(b)(1), unless the authorization is terminated  or revoked sooner.       Influenza Montez Cuda by PCR NEGATIVE NEGATIVE Final   Influenza B by PCR NEGATIVE NEGATIVE Final    Comment: (NOTE) The Xpert Xpress SARS-CoV-2/FLU/RSV plus assay is intended as an aid in the diagnosis of influenza from Nasopharyngeal swab specimens and should not be used as Lyal Husted sole basis for treatment. Nasal washings and aspirates  are unacceptable for Xpert Xpress SARS-CoV-2/FLU/RSV testing.  Fact Sheet for Patients: EntrepreneurPulse.com.au  Fact Sheet for Healthcare Providers: IncredibleEmployment.be  This test is not yet approved or cleared by the [place] FDA and has been authorized for detection and/or diagnosis of SARS-CoV-2 by FDA under an Emergency Use Authorization (EUA). This EUA will remain in effect (meaning this test can be used) for the duration of the COVID-19 declaration under Section 564(b)(1) of the Act, 21 U.S.C. section 360bbb-3(b)(1), unless the authorization is terminated or revoked.  Performed at [facility], [place] [address], [place], [place] [address]   Culture, blood (routine x 2)     Status: None (Preliminary result)   Collection Time: 12/17/21  6:32 PM   Specimen: BLOOD LEFT HAND  Result Value Ref Range Status   Specimen Description   Final    BLOOD LEFT HAND Performed at [facility] [address] [place], [place] [address]    Special Requests   Final    BOTTLES DRAWN AEROBIC ONLY Blood Culture results may not be optimal due to an inadequate volume of blood received in culture bottles Performed at [facility] [address], [place], [place] [address]    Culture   Final    NO GROWTH 3 DAYS Performed at [facility], [place] [address] [place], [place] [address]    Report Status PENDING  Incomplete  Culture, blood (routine x 2)     Status: None (Preliminary result)   Collection Time: 12/17/21  6:32 PM   Specimen: BLOOD LEFT FOREARM  Result Value Ref Range Status   Specimen Description   Final    BLOOD LEFT FOREARM Performed at [facility] [address] [place],  [address]    Special Requests  Final    BOTTLES DRAWN AEROBIC ONLY Blood Culture results may not be optimal due to an inadequate volume of blood received in culture  bottles Performed at [facility], [place] [address], [place], [place] [address]    Culture   Final    NO GROWTH 3 DAYS Performed at [facility], [place] [address], [place], [place] [address]    Report Status PENDING  Incomplete  Urine Culture     Status: Abnormal   Collection Time: 12/18/21  3:00 AM   Specimen: Urine, Clean Catch  Result Value Ref Range Status   Specimen Description   Final    URINE, CLEAN CATCH Performed at [facility], [place] [address] [place], [place] [address]    Special Requests   Final    Immunocompromised Performed at [facility], [place] [address], [place], [place] [address]    Culture ([place])  Final    <10,000 COLONIES/mL INSIGNIFICANT GROWTH Performed at [facility] [address], [place], [place] [address]    Report Status 12/19/2021 FINAL  Final  MRSA Next Gen by PCR, Nasal     Status: None   Collection Time: 12/19/21  5:40 AM   Specimen: Nasal Mucosa; Nasal Swab  Result Value Ref Range Status   MRSA by PCR Next Gen NOT DETECTED NOT DETECTED Final    Comment: (NOTE) The GeneXpert MRSA Assay (FDA approved for NASAL specimens only), is one component of Ladarius Seubert comprehensive MRSA colonization surveillance program. It is not intended to diagnose MRSA infection nor to guide or monitor treatment for MRSA infections. Test performance is not FDA approved in patients less than 25 years old. Performed at [facility], [place] [address], [place], [place] [address]          Radiology Studies: DG CHEST PORT 1 VIEW  Result Date: 12/19/2021 CLINICAL DATA:  Pneumonia EXAM: PORTABLE CHEST 1 VIEW COMPARISON:  Chest radiograph 12/19/2021 FINDINGS: There is Marenda Accardi new right upper extremity PICC terminating at the cavoatrial junction. The cardiomediastinal silhouette is stable. There are patchy opacities in the medial right base, slightly increased from prior. There is no other focal airspace  disease. There is no significant pleural effusion. There is no pneumothorax. There is no acute osseous abnormality. IMPRESSION: 1. New right upper extremity PICC with tip at the region of the cavoatrial junction. 2. Patchy opacities in the medial right base are slightly increased from prior and could reflect developing infection. Electronically Signed   By: Valetta Mole M.D.   On: 12/19/2021 08:04   ECHOCARDIOGRAM COMPLETE  Result Date: 12/20/2021    ECHOCARDIOGRAM REPORT   Patient Name:   GEANIE PACIFICO Date of Exam: 12/20/2021 Medical Rec #:  [id number]      Height:       68.0 in Accession #:    [id number]     Weight:       151.7 lb Date of Birth:  02-Nov-1971      BSA:          1.817 m Patient Age:    46 years       BP:           143/94 mmHg Patient Gender: F              HR:           117 bpm. Exam Location:  Inpatient Procedure: 2D Echo Indications:    Elevated brain natriuretic peptide level  History:        Patient has prior history of Echocardiogram examinations and                 Patient has no prior history of Echocardiogram examinations.                 COPD. Irregular heartrate.  Sonographer:    Arlyss Gandy Referring Phys: [place]  1. Left ventricular ejection fraction, by estimation, is 60 to 65%. The left ventricle has normal function. The left ventricle has no regional wall motion abnormalities. Left ventricular diastolic parameters were normal.  2. Right ventricular systolic function is normal. The right ventricular size is normal. Tricuspid regurgitation signal is inadequate for assessing PA pressure.  3. The pericardial effusion is anterior to the right ventricle.  4. The mitral valve is normal in structure. Trivial mitral valve regurgitation. No evidence of mitral stenosis.  5. The aortic valve is normal in structure. Aortic valve regurgitation is not visualized. No aortic stenosis is present.  6. The inferior vena cava is normal in size with <50% respiratory  variability, suggesting right atrial pressure of 8 mmHg. FINDINGS  Left Ventricle: Left ventricular ejection fraction, by estimation, is 60 to 65%. The left ventricle has normal function. The left ventricle has no regional wall motion abnormalities. The left ventricular internal cavity size was normal in size. There is  no left ventricular hypertrophy. Left ventricular diastolic parameters were normal. Normal left ventricular filling pressure. Right Ventricle: The right ventricular size is normal. No increase in right ventricular wall thickness. Right ventricular systolic function is normal. Tricuspid regurgitation signal is inadequate for assessing PA pressure. Left Atrium: Left atrial size was normal in size. Right Atrium: Right atrial size was normal in size. Pericardium: Trivial pericardial effusion is present. The pericardial effusion is anterior to the right ventricle. Mitral Valve: The mitral valve is normal in structure. Trivial mitral valve regurgitation. No evidence of mitral valve stenosis. Tricuspid Valve: The tricuspid valve is normal in structure. Tricuspid valve regurgitation is not demonstrated. No evidence of tricuspid stenosis. Aortic Valve: The aortic valve is normal in structure. Aortic valve regurgitation is not visualized. No aortic stenosis is present. Aortic valve mean gradient measures 3.0 mmHg. Aortic valve peak gradient measures 6.0 mmHg. Aortic valve area, by VTI measures 2.03 cm. Pulmonic Valve: The pulmonic valve was normal in structure. Pulmonic valve regurgitation is trivial. No evidence of pulmonic stenosis. Aorta: The aortic root is normal in size and structure. Venous: The inferior vena cava is normal in size with less than 50% respiratory variability, suggesting right atrial pressure of 8 mmHg. IAS/Shunts: No atrial level shunt detected by color flow Doppler.  LEFT VENTRICLE PLAX 2D LVIDd:         4.40 cm   Diastology LVIDs:         3.60 cm   LV e' medial:    14.10 cm/s LV PW:          0.90 cm   LV E/e' medial:  8.5 LV IVS:        0.90 cm   LV e' lateral:   17.70 cm/s LVOT diam:     2.00 cm   LV E/e' lateral: 6.8 LV SV:         41 LV SV Index:   22 LVOT Area:     3.14 cm  RIGHT VENTRICLE             IVC RV Basal  diam:  3.00 cm     IVC diam: 2.20 cm RV Mid diam:    3.60 cm RV S prime:     17.30 cm/s TAPSE (M-mode): 1.6 cm LEFT ATRIUM             Index        RIGHT ATRIUM          Index LA diam:        3.20 cm 1.76 cm/m   RA Area:     9.01 cm LA Vol (A2C):   33.7 ml 18.55 ml/m  RA Volume:   16.80 ml 9.25 ml/m LA Vol (A4C):   37.0 ml 20.36 ml/m LA Biplane Vol: 35.3 ml 19.43 ml/m  AORTIC VALVE AV Area (Vmax):    1.89 cm AV Area (Vmean):   1.84 cm AV Area (VTI):     2.03 cm AV Vmax:           122.00 cm/s AV Vmean:          86.000 cm/s AV VTI:            0.201 m AV Peak Grad:      6.0 mmHg AV Mean Grad:      3.0 mmHg LVOT Vmax:         73.50 cm/s LVOT Vmean:        50.500 cm/s LVOT VTI:          0.130 m LVOT/AV VTI ratio: 0.65  AORTA Ao Root diam: 3.20 cm Ao Asc diam:  3.20 cm MITRAL VALVE MV Area (PHT): 3.77 cm     SHUNTS MV Decel Time: 201 msec     Systemic VTI:  0.13 m MV E velocity: 120.00 cm/s  Systemic Diam: 2.00 cm Fransico Him MD Electronically signed by Fransico Him MD Signature Date/Time: 12/20/2021/2:34:21 PM    Final    VAS [place] UPPER EXTREMITY VENOUS DUPLEX  Result Date: 12/20/2021 UPPER VENOUS STUDY  Patient Name:  CALAIS SVEHLA  Date of Exam:   12/20/2021 Medical Rec #: [id number]       Accession #:    [id number] Date of Birth: 1971/07/08       Patient Gender: F Patient Age:   62 years Exam Location[facility]  [facility] Procedure:      VAS [place] UPPER EXTREMITY VENOUS DUPLEX Referring Phys: Jaycelynn Knickerbocker POWELL JR --------------------------------------------------------------------------------  Indications: Edema Other Indications: PICC line in RUE. Risk Factors: CA patient on chemotherapy. Comparison Study: No previous exams Performing Technologist: Jody Hill RVT, RDMS  Examination  Guidelines: Amond Speranza complete evaluation includes B-mode imaging, spectral Doppler, color Doppler, and power Doppler as needed of all accessible portions of each vessel. Bilateral testing is considered an integral part of Fritz Cauthon complete examination. Limited examinations for reoccurring indications may be performed as noted.  Right Findings: +----------+------------+---------+-----------+----------+---------------------+  RIGHT      Compressible Phasicity Spontaneous Properties        Summary         +----------+------------+---------+-----------+----------+---------------------+  IJV            Full        Yes        Yes                                       +----------+------------+---------+-----------+----------+---------------------+  Subclavian     Full        Yes  Yes                                       +----------+------------+---------+-----------+----------+---------------------+  Axillary       Full        Yes        Yes                                       +----------+------------+---------+-----------+----------+---------------------+  Brachial       None        No         No                 Acute - one of paired                                                                  brachials        +----------+------------+---------+-----------+----------+---------------------+  Radial         Full                                                             +----------+------------+---------+-----------+----------+---------------------+  Ulnar          Full                                                             +----------+------------+---------+-----------+----------+---------------------+  Cephalic       Full                                                             +----------+------------+---------+-----------+----------+---------------------+  Basilic      Partial       Yes        Yes                        Acute           +----------+------------+---------+-----------+----------+---------------------+  Left Findings: +----------+------------+---------+-----------+----------+-------+  LEFT       Compressible Phasicity Spontaneous Properties Summary  +----------+------------+---------+-----------+----------+-------+  Subclavian     Full        Yes        Yes                         +----------+------------+---------+-----------+----------+-------+  Summary:  Right: Findings consistent with acute deep vein thrombosis involving the right brachial veins. Findings consistent with acute superficial vein thrombosis involving the right basilic vein.  Left: No evidence of thrombosis in  the subclavian.  *See table(s) above for measurements and observations.    Preliminary         Scheduled Meds:  chlorhexidine  15 mL Mouth Rinse BID   Chlorhexidine Gluconate Cloth  6 each Topical Daily   dextromethorphan-guaiFENesin  1 tablet Oral BID   feeding supplement  1 Container Oral TID BM   mouth rinse  15 mL Mouth Rinse q12n4p   methylPREDNISolone (SOLU-MEDROL) injection  40 mg Intravenous Q12H   metoprolol tartrate  2.5 mg Intravenous Q8H   multivitamin with minerals  1 tablet Oral Daily   nicotine  21 mg Transdermal Daily   oxyCODONE  20 mg Oral Q12H   pantoprazole (PROTONIX) IV  40 mg Intravenous Q12H   phosphorus  500 mg Oral BID   sodium chloride flush  10-40 mL Intracatheter Q12H   sucralfate  1 g Oral TID WC & HS   Continuous Infusions:  sodium chloride Stopped (12/19/21 1435)   ceFEPime (MAXIPIME) IV Stopped (12/20/21 1412)   famotidine (PEPCID) IV Stopped (12/20/21 1325)   promethazine (PHENERGAN) injection (IM or IVPB) 6.25 mg (12/20/21 1621)     LOS: 6 days    Time spent: over 30 min    Fayrene Helper, MD [facility]   To contact the attending provider between 7A-7P or the covering provider during after hours 7P-7A, please log into the web site [email or web address] and access using universal [facility] password for that web site. If you do not have the password, please call the hospital operator.  12/20/2021, 4:43 PM

## 2021-12-21 ENCOUNTER — Ambulatory Visit (HOSPITAL_COMMUNITY)
Admission: RE | Admit: 2021-12-21 | Discharge: 2021-12-21 | Disposition: A | Discharge status: Home / Self Care | Payer: Medicaid Other | Source: Ambulatory Visit | Attending: Physician Assistant | Admitting: Physician Assistant

## 2021-12-21 DIAGNOSIS — J9601 Acute respiratory failure with hypoxia: Secondary | ICD-10-CM | POA: Diagnosis not present

## 2021-12-21 DIAGNOSIS — Z515 Encounter for palliative care: Secondary | ICD-10-CM | POA: Diagnosis not present

## 2021-12-21 DIAGNOSIS — Z7189 Other specified counseling: Secondary | ICD-10-CM | POA: Diagnosis not present

## 2021-12-21 DIAGNOSIS — C3411 Malignant neoplasm of upper lobe, right bronchus or lung: Secondary | ICD-10-CM | POA: Diagnosis not present

## 2021-12-21 LAB — CBC WITH DIFFERENTIAL/PLATELET
Abs Immature Granulocytes: 0.6 10*3/uL — ABNORMAL HIGH (ref 0.00–0.07)
Band Neutrophils: 2 %
Basophils Absolute: 0 10*3/uL (ref 0.0–0.1)
Basophils Relative: 0 %
Eosinophils Absolute: 0 10*3/uL (ref 0.0–0.5)
Eosinophils Relative: 0 %
HCT: 22.4 % — ABNORMAL LOW (ref 36.0–46.0)
Hemoglobin: 7.7 g/dL — ABNORMAL LOW (ref 12.0–15.0)
Lymphocytes Relative: 12 %
Lymphs Abs: 0.7 10*3/uL (ref 0.7–4.0)
MCH: 32.5 pg (ref 26.0–34.0)
MCHC: 34.4 g/dL (ref 30.0–36.0)
MCV: 94.5 fL (ref 80.0–100.0)
Metamyelocytes Relative: 4 %
Monocytes Absolute: 0.4 10*3/uL (ref 0.1–1.0)
Monocytes Relative: 6 %
Myelocytes: 2 %
Neutro Abs: 4.5 10*3/uL (ref 1.7–7.7)
Neutrophils Relative %: 70 %
Platelets: 32 10*3/uL — ABNORMAL LOW (ref 150–400)
Promyelocytes Relative: 4 %
RBC: 2.37 MIL/uL — ABNORMAL LOW (ref 3.87–5.11)
RDW: 15.3 % (ref 11.5–15.5)
WBC: 6.2 10*3/uL (ref 4.0–10.5)
nRBC: 0.5 % — ABNORMAL HIGH (ref 0.0–0.2)

## 2021-12-21 LAB — COMPREHENSIVE METABOLIC PANEL
ALT: 32 U/L (ref 0–44)
AST: 23 U/L (ref 15–41)
Albumin: 2.7 g/dL — ABNORMAL LOW (ref 3.5–5.0)
Alkaline Phosphatase: 77 U/L (ref 38–126)
Anion gap: 10 (ref 5–15)
BUN: 25 mg/dL — ABNORMAL HIGH (ref 6–20)
CO2: 25 mmol/L (ref 22–32)
Calcium: 8.7 mg/dL — ABNORMAL LOW (ref 8.9–10.3)
Chloride: 100 mmol/L (ref 98–111)
Creatinine, Ser: 0.95 mg/dL (ref 0.44–1.00)
GFR, Estimated: >60 mL/min (ref >60)
Glucose, Bld: 126 mg/dL — ABNORMAL HIGH (ref 70–99)
Potassium: 3.3 mmol/L — ABNORMAL LOW (ref 3.5–5.1)
Sodium: 135 mmol/L (ref 135–145)
Total Bilirubin: 0.8 mg/dL (ref 0.3–1.2)
Total Protein: 6.4 g/dL — ABNORMAL LOW (ref 6.5–8.1)

## 2021-12-21 LAB — HEPARIN LEVEL (UNFRACTIONATED)
Heparin Unfractionated: <0.1 IU/mL — ABNORMAL LOW (ref 0.30–0.70)
Heparin Unfractionated: 0.11 IU/mL — ABNORMAL LOW (ref 0.30–0.70)

## 2021-12-21 LAB — MAGNESIUM: Magnesium: 1.9 mg/dL (ref 1.7–2.4)

## 2021-12-21 LAB — PHOSPHORUS: Phosphorus: 2.9 mg/dL (ref 2.5–4.6)

## 2021-12-21 MED ORDER — BENZONATATE 100 MG PO CAPS
200.0000 mg | ORAL_CAPSULE | Freq: Two times a day (BID) | ORAL | Status: AC | PRN
  Administered 2021-12-21 – 2021-12-23 (×2): 200 mg via ORAL
  Filled 2021-12-21 (×2): qty 2

## 2021-12-21 MED ORDER — ACETAMINOPHEN 160 MG/5ML PO SOLN
650.0000 mg | Freq: Four times a day (QID) | ORAL | Status: DC | PRN
  Administered 2021-12-26: 650 mg via ORAL
  Filled 2021-12-21: qty 20.3

## 2021-12-21 MED ORDER — ENSURE ENLIVE PO LIQD: 237.0000 mL | Freq: Two times a day (BID) | ORAL | Status: DC

## 2021-12-21 MED ORDER — POTASSIUM CHLORIDE 10 MEQ/50ML IV SOLN
10.0000 meq | INTRAVENOUS | Status: DC
  Administered 2021-12-21: 10 meq via INTRAVENOUS
  Filled 2021-12-21: qty 50

## 2021-12-21 MED ORDER — ACETAMINOPHEN 160 MG/5ML PO SOLN
1000.0000 mg | Freq: Three times a day (TID) | ORAL | Status: AC
Start: 2021-12-21 — End: 2021-12-23
  Administered 2021-12-21 – 2021-12-23 (×9): 1000 mg via ORAL
  Filled 2021-12-21 (×9): qty 40.6

## 2021-12-21 MED ORDER — HYDROCOD POLI-CHLORPHE POLI ER 10-8 MG/5ML PO SUER
5.0000 mL | Freq: Two times a day (BID) | ORAL | Status: DC | PRN
  Administered 2021-12-21 – 2021-12-23 (×3): 5 mL via ORAL
  Filled 2021-12-21 (×3): qty 5

## 2021-12-21 MED ORDER — POTASSIUM CHLORIDE 20 MEQ PO PACK
40.0000 meq | PACK | ORAL | Status: AC
  Administered 2021-12-21 (×2): 40 meq via ORAL
  Filled 2021-12-21 (×2): qty 2

## 2021-12-21 MED ORDER — HEPARIN (PORCINE) 25000 UT/250ML-% IV SOLN
1750.0000 [IU]/h | INTRAVENOUS | Status: DC
  Administered 2021-12-21 (×2): 1300 [IU]/h via INTRAVENOUS
  Administered 2021-12-22: 1750 [IU]/h via INTRAVENOUS
  Filled 2021-12-21 (×2): qty 250

## 2021-12-21 MED ORDER — GUAIFENESIN-CODEINE 100-10 MG/5ML PO SOLN
5.0000 mL | Freq: Four times a day (QID) | ORAL | Status: DC | PRN
  Administered 2021-12-21: 5 mL via ORAL
  Filled 2021-12-21: qty 5

## 2021-12-21 MED ORDER — FUROSEMIDE 10 MG/ML IJ SOLN
40.0000 mg | Freq: Once | INTRAMUSCULAR | Status: AC
  Administered 2021-12-21: 40 mg via INTRAVENOUS
  Filled 2021-12-21: qty 4

## 2021-12-21 NOTE — Progress Notes (Signed)
[facility] Progress Note Patient Name: [name] DOB: 1971/03/14 MRN: [id number]   Date of Service  12/21/2021  HPI/Events of Note  Robitussin DM is failing to suppress patient's cough.  eICU Interventions  Robitussin DM discontinued, and Robitussin AC  substituted.        Kerry Kass Ansar Skoda 12/21/2021, 2:15 AM

## 2021-12-21 NOTE — Progress Notes (Signed)
[facility] ADULT ICU REPLACEMENT PROTOCOL   The patient does apply for the [facility] Adult ICU Electrolyte Replacment Protocol based on the criteria listed below:   1.Exclusion criteria: TCTS patients, ECMO patients, and Dialysis patients 2. Is GFR >/= 30 ml/min? Yes.    Patient's GFR today is >60 3. Is SCr </= 2? Yes.   Patient's SCr is 0.95 mg/dL 4. Did SCr increase >/= 0.5 in 24 hours? No. 5.Pt's weight >40kg  Yes.   6. Abnormal electrolyte(s): K 3.3  7. Electrolytes replaced per protocol 8.  Call MD STAT for K+ </= 2.5, Phos </= 1, or Mag </= 1 Physician:    Ronda Fairly A 12/21/2021 6:05 AM

## 2021-12-21 NOTE — Progress Notes (Signed)
ANTICOAGULATION CONSULT NOTE - follow up  Pharmacy Consult for Heparin Indication: DVT  Allergies  Allergen Reactions   Naproxen Nausea Only   Bactrim [Sulfamethoxazole-Trimethoprim] Nausea Only    Patient Measurements: Height: 5\' 8"  (172.7 cm) Weight: 68.8 kg (151 lb 10.8 oz) IBW/kg (Calculated) : 63.9 Heparin Dosing Weight: TBW  Vital Signs: Temp: 98.2 F (36.8 C) (01/27 0000) Temp Source: Oral (01/27 0000) BP: 122/69 (01/26 2233) Pulse Rate: 111 (01/26 2233)  Labs: Recent Labs    12/18/21 1208 12/18/21 2140 12/19/21 0535 12/19/21 1548 12/20/21 0435 12/21/21 0104  HGB  --  7.9* 7.3* 8.4* 7.8*  --   HCT  --  23.9* 21.4* 24.4* 22.7*  --   PLT  --  87* 48*  --  41*  --   HEPARINUNFRC  --   --   --   --   --  <0.10*  CREATININE 0.69  --  0.86  --  0.87  --      Estimated Creatinine Clearance: 78 mL/min (by C-G formula based on SCr of 0.87 mg/dL).   Medical History: Past Medical History:  Diagnosis Date   Ankle sprain    left   Anxiety    treated for panic attacks in the past.   Asthma    COPD (chronic obstructive pulmonary disease) ([place])    Depression    History of radiation therapy    Right lung- 09/26/21-11/13/21- Dr. Gery Pray   Irregular heart rate    Lung cancer [facility])    Renal disorder    kidney infections    Medications:  Infusions:   sodium chloride Stopped (12/19/21 1435)   ceFEPime (MAXIPIME) IV 2 g (12/20/21 2345)   famotidine (PEPCID) IV Stopped (12/20/21 1325)   heparin 1,100 Units/hr (12/20/21 1811)   promethazine (PHENERGAN) injection (IM or IVPB) Stopped (12/20/21 1636)    Assessment: [name] admtted on 1/20 with N/V, respiratory failure.  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  Pharmacy is now consulted to dose Heparin (no bolus, low goal per Dr Florene Glen) for acute upper extremity DVT in the setting of PICC line.   Oncology is consulted and following for pancytopenia related to chemo.  Per [facility] note  oncology is "ok with anticoagulation without bolus." No recent anticoagulation.  Granix given 1/21- 1/25.  CBC:  Hgb remains low at 7.8, Plt decreased to 41k  Goal of Therapy:  Heparin level 0.3-0.5 units/ml Monitor platelets by anticoagulation protocol: Yes  1st HL<0.1 subtherapeutic on 1100 units/hr Per RN no bleeding or interruptions   Plan:  NO bolus Increase heparin to 1300 units/hr Check anti-Xa level in 6 hours and daily while on heparin.  Note low goal range 0.3-0.5 units/ml.  Check daily CBC - continue to monitor H&H and platelets   Dolly Rias RPh 12/21/2021, 1:35 AM

## 2021-12-21 NOTE — Evaluation (Signed)
Clinical/Bedside Swallow Evaluation Patient Details  Name: [name] MRN: [id number] Date of Birth: 12-Jul-1971  Today's Date: 12/21/2021 Time: SLP Start Time (ACUTE ONLY): 1237 SLP Stop Time (ACUTE ONLY): 1307 SLP Time Calculation (min) (ACUTE ONLY): 30 min  Past Medical History:  Past Medical History:  Diagnosis Date   Ankle sprain    left   Anxiety    treated for panic attacks in the past.   Asthma    COPD (chronic obstructive pulmonary disease) ([facility])    Depression    History of radiation therapy    Right lung- 09/26/21-11/13/21- Dr. Gery Pray   Irregular heart rate    Lung cancer [facility])    Renal disorder    kidney infections   Past Surgical History:  Past Surgical History:  Procedure Laterality Date   APPENDECTOMY     BRONCHIAL BIOPSY  09/21/2021   Procedure: BRONCHIAL BIOPSIES;  Surgeon: Collene Gobble, MD;  Location: [place];  Service: Pulmonary;;   BRONCHIAL BRUSHINGS  09/21/2021   Procedure: BRONCHIAL BRUSHINGS;  Surgeon: Collene Gobble, MD;  Location: [place];  Service: Pulmonary;;   BRONCHIAL NEEDLE ASPIRATION BIOPSY  09/21/2021   Procedure: BRONCHIAL NEEDLE ASPIRATION BIOPSIES;  Surgeon: Collene Gobble, MD;  Location: [facility];  Service: Pulmonary;;   LUMBAR LAMINECTOMY N/A 05/13/2018   Procedure: LEFT LUMBAR FOUR-FIVE MICRODISCECTOMY;  Surgeon: Marybelle Killings, MD;  Location: [place];  Service: Orthopedics;  Laterality: N/A;   TUBAL LIGATION     TUBAL LIGATION  1998   VIDEO BRONCHOSCOPY WITH ENDOBRONCHIAL ULTRASOUND N/A 09/21/2021   Procedure: VIDEO BRONCHOSCOPY WITH ENDOBRONCHIAL ULTRASOUND;  Surgeon: Collene Gobble, MD;  Location: [place] [facility];  Service: Pulmonary;  Laterality: N/A;   HPI:  Pt is a 51 yo female adm [facility] on 1/21 with some increase in dyspnea.  Per medical notes pt has lung cancer undergoing chemoradiation. "She was noted to have vomited w/ carafate and lidocaine viscous; 1/20  w/ cc: shortness of breath and chest pain.  Completed chemo about 8d prior w/ post-chemo course c/b nausea, vomiting and poor po intake.1/22 diet advanced to full liq. Got another unit PLTs. Had some hemoptysis w/ cough 1/23 new fever, rapid response called for increased WOB and tachycardia. Pt anxious. PCXR w/ possible LL airspace disease. 1/24 remained tachycardic, still having CP and shoulder pain, still having nausea but no emesis. Developed progressive respiratory distress w/ loud audible expiratory wheezing. Pulse oximetry did not drop significantly however O2 was rapidly titrated up w/ little improvement in WOB. PCCM called emergently to bedside. Marked Upper airway expiratory wheezing noted w/ significant reduction during phonation efforts and when reassured." Swallow eval ordered.  Pt admits to "choking" on liquids since her cancer diagnosis in October 2022,    Assessment / Plan / Recommendation  Clinical Impression  Patient presents with clinical indications consistent with potential multifactorial dysphagia.   No focal CN deficits present and her voice subjectively judged to be strong.  She does endorse "choking" on liquids since October 2022.  SLP observed pt consuming thin water, thin juice, nectar thick juice, applesauce and graham cracker.  She has a chronic cough at baseline - which increased with intake - however she states she coughs "all of the time". Cough may be effort induced.  Pt without odynophagia during intake observed and subjectively reports thin liquid easier to swallow than nectar thick juice.  She reports head tilt to the left with swallowing liquids to "slow the rate" down - stating  it decreases her coughing.  Also reports since XRT was done one month ago, she has improvement in cough/choking.  Reviewed option of MBS to allow instrumental oropharyngeal eval or esophagram *given XRT and esophagitis* if MD agreed* or monitor for tolerance using compensation strategies.  Pt states she will "do whatever is best".  SLP  recommends to consider MBS to rule out pharyngeal component given her COPD and XRT and continue diet pending instrumental eval.  Using teach back, advised pt to monitor her "choking" with liquids - trying variety of thickness of drinks assessing for improvement and monitoring when coughing, "choking" is occuring. Suspect primary esophageal deficits given XRT but can not rule out pharyngeal/cervical esopahgeal component.  Pt in agreement using teach back.  Given pt's intact cognition, SLP did not leave written instructions. SLP Visit Diagnosis: Dysphagia, unspecified (R13.10)    Aspiration Risk  Mild aspiration risk    Diet Recommendation Regular;Thin liquid   Liquid Administration via: Cup;Straw Medication Administration: Other (Comment) (as tolerated, with puree if problematic) Supervision: Patient able to self feed Compensations: Slow rate;Small sips/bites Postural Changes: Seated upright at 90 degrees;Remain upright for at least 30 minutes after po intake    Other  Recommendations Oral Care Recommendations: Oral care BID Other Recommendations: Other (Comment) (allow thickener use prn if pt finds it helpful)    Recommendations for follow up therapy are one component of a multi-disciplinary discharge planning process, led by the attending physician.  Recommendations may be updated based on patient status, additional functional criteria and insurance authorization.  Follow up Recommendations   TBD     Assistance Recommended at Discharge  TBD  Functional Status Assessment Patient has had a recent decline in their functional status and demonstrates the ability to make significant improvements in function in a reasonable and predictable amount of time.  Frequency and Duration min 1 x/week  1 week       Prognosis Prognosis for Safe Diet Advancement: Fair Barriers to Reach Goals: Time post onset      Swallow Study   General Date of Onset: 12/21/21 HPI: Pt is a 51 yo female adm to [facility]  on 1/21 with some increase in dyspnea.  Per medical notes pt has lung cancer undergoing chemoradiation. "She was noted to have vomited w/ carafate and lidocaine viscous; 1/20  w/ cc: shortness of breath and chest pain. Completed chemo about 8d prior w/ post-chemo course c/b nausea, vomiting and poor po intake.1/22 diet advanced to full liq. Got another unit PLTs. Had some hemoptysis w/ cough 1/23 new fever, rapid response called for increased WOB and tachycardia. Pt anxious. PCXR w/ possible LL airspace disease. 1/24 remained tachycardic, still having CP and shoulder pain, still having nausea but no emesis. Developed progressive respiratory distress w/ loud audible expiratory wheezing. Pulse oximetry did not drop significantly however O2 was rapidly titrated up w/ little improvement in WOB. PCCM called emergently to bedside. Marked Upper airway expiratory wheezing noted w/ significant reduction during phonation efforts and when reassured." Swallow eval ordered.  Pt admits to "choking" on liquids since her cancer diagnosis in October 2022, Type of Study: Bedside Swallow Evaluation Diet Prior to this Study: Thin liquids;Regular Temperature Spikes Noted: No Respiratory Status: Other (comment) (4 L HFNC) History of Recent Intubation: No Behavior/Cognition: Alert;Cooperative;Pleasant mood Oral Cavity Assessment: Within Functional Limits;Other (comment) (some dentition present) Oral Care Completed by SLP: No Oral Cavity - Dentition: Other (Comment);Missing dentition;Poor condition (some dentition, plate) Vision: Functional for self-feeding Self-Feeding Abilities: Able to feed  self Patient Positioning: Upright in bed Baseline Vocal Quality: Normal Volitional Cough: Strong Volitional Swallow: Able to elicit    Oral/Motor/Sensory Function Overall Oral Motor/Sensory Function: Within functional limits   Ice Chips Ice chips: Not tested   Thin Liquid Thin Liquid: Impaired Presentation: Self  Fed;Cup;Straw Pharyngeal  Phase Impairments: Cough - Immediate Other Comments: pt did not pass 3 ounce yale water challenge due to immediate cough post-swallow    Nectar Thick Nectar Thick Liquid: Within functional limits Presentation: Cup;Self Fed;Straw   Honey Thick Honey Thick Liquid: Not tested   Puree Puree: Within functional limits Presentation: Self Fed   Solid     Solid: Impaired Presentation: Self Fed Pharyngeal Phase Impairments: Cough - Delayed      Macario Golds 12/21/2021,1:22 PM  Kathleen Lime, MS [facility] SLP [place] Office [phone] Cell [phone]

## 2021-12-21 NOTE — Progress Notes (Signed)
ANTICOAGULATION CONSULT NOTE - follow up  Pharmacy Consult for Heparin Indication: DVT  Allergies  Allergen Reactions   Naproxen Nausea Only   Bactrim [Sulfamethoxazole-Trimethoprim] Nausea Only    Patient Measurements: Height: 5\' 8"  (172.7 cm) Weight: 68.8 kg (151 lb 10.8 oz) IBW/kg (Calculated) : 63.9 Heparin Dosing Weight: TBW  Vital Signs: Temp: 98.8 F (37.1 C) (01/27 0400) Temp Source: Oral (01/27 0400) BP: 121/72 (01/27 0540) Pulse Rate: 116 (01/27 0800)  Labs: Recent Labs    12/19/21 0535 12/19/21 1548 12/20/21 0435 12/21/21 0104 12/21/21 0503  HGB 7.3* 8.4* 7.8*  --  7.7*  HCT 21.4* 24.4* 22.7*  --  22.4*  PLT 48*  --  41*  --  32*  HEPARINUNFRC  --   --   --  <0.10*  --   CREATININE 0.86  --  0.87  --  0.95     Estimated Creatinine Clearance: 71.5 mL/min (by C-G formula based on SCr of 0.95 mg/dL).   Medical History: Past Medical History:  Diagnosis Date   Ankle sprain    left   Anxiety    treated for panic attacks in the past.   Asthma    COPD (chronic obstructive pulmonary disease) ([place])    Depression    History of radiation therapy    Right lung- 09/26/21-11/13/21- Dr. Gery Pray   Irregular heart rate    Lung cancer [facility])    Renal disorder    kidney infections    Medications:  Infusions:   sodium chloride Stopped (12/19/21 1435)   ceFEPime (MAXIPIME) IV Stopped (12/21/21 0647)   famotidine (PEPCID) IV Stopped (12/20/21 1325)   promethazine (PHENERGAN) injection (IM or IVPB) Stopped (12/21/21 0525)    Assessment: 44 yoF admtted on 1/20 with N/V, respiratory failure.  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  Pharmacy is now consulted to dose Heparin  for acute upper extremity DVT in the setting of PICC line.     No recent anticoagulation.  Granix given 1/21- 1/25.  CBC:  Hgb remains low at 7.8, Plt decreased to 41k  Heparin drip was stopped to discuss with oncology about continuing care with heparin  in light of platelets.   Oncology recommendation now is:  Target Goals ( plts) : hold for <25, aim for 0.3-0.5 less than 50, and standard dose above 50. No bolus at any point  Goal of Therapy:  Heparin level 0.3-0.5 units/ml Monitor platelets by anticoagulation protocol: Yes  Today, 12/21/21  Resume heparin with target goal of 0.3 to 0.5 as plt are 32  Hgb 7.7 SCr < 1, CrCl 71 ml/min     Plan:  NO bolus Target Goals ( plts) : hold for <25, aim for 0.3-0.5 less than 50, and standard dose above 50. No bolus at any point   Resume  heparin at  1300 units/hr Check anti-Xa level in 6 hours and daily while on heparin.  Note low goal range 0.3-0.5 units/ml, currently   Check daily CBC - continue to monitor H&H and platelets     Royetta Asal, PharmD, BCPS 12/21/2021 9:50 AM

## 2021-12-21 NOTE — Progress Notes (Signed)
Nutrition Follow-up  DOCUMENTATION CODES:   Not applicable  INTERVENTION:  - will d/c Boost Breeze per patient's preference. - encourage visitors to bring in foods and beverages she enjoys.    NUTRITION DIAGNOSIS:   Increased nutrient needs related to cancer and cancer related treatments as evidenced by estimated needs.- ongoing  GOAL:   Patient will meet greater than or equal to 90% of their needs -unmet at this time  MONITOR:   PO intake, Labs, Weight trends   ASSESSMENT:   51 year old female with past medical history significant for small cell lung cancer with right hilar/suprahilar mass with occlusion of the right upper lobe bronchus right paratracheal/infrahilar/subcarinal lymphadenopathy diagnosed October 2022 currently on chemotherapy and radiation, COPD, tobacco use disorder who presented to [facility] ED on 1/20 with nausea/vomiting, shortness of breath, right-sided chest pain.  Currently on chemotherapy, last infusion on 12/06/2021.  Her nausea and vomiting has progressed and she has been unable to keep her medications down.  Patient sitting on the side of the bed. No visitors present at the time of RD visit. She shares that she had a coughing spell throughout most of the night.   Diet advanced from CLD to LFD on 1/21 at 1740 and to Soft yesterday at [place]. She has mainly consumed 50-60% at meals since lunch on 1/21. Most recently documented meal completion percentage was 50% of breakfast yesterday.   She shares that she was not feeling up to eating breakfast this AM and that her mom is going to bring her a large breakfast from [facility] tomorrow morning. She does not care for the taste of the food here. She has ordered green beans, meatloaf, and mac and cheese to be delivered for lunch around 1330.   Patient does not like Boost Breeze and refuses to drink them. She was drinking oral nutrition supplements PTA and is no longer open to receiving or consuming them.   She reports some  dizziness with standing and ambulation at home but this has improved. She reports near constant nausea but that it is overall well controlled and that she is able to eat despite this.   She has not been weighed since admission on 1/21.   Labs reviewed; K: 3.3 mmol/l, BUN: 25 mg/dl, Ca: 8.7 mg/dl.  Medications reviewed; 20 mg IV pepcid/day, 40 mg IV lasix x1 dose 1/27, 40 mg solu-medrol BID, 1 tablet multivitamin with minerals/day, 40 mg IV protonix BID, 40 mEq Klor-Con x2 doses 1/27, 1 g carafate TID.     NUTRITION - FOCUSED PHYSICAL EXAM:  Flowsheet Row Most Recent Value  Orbital Region No depletion  Upper Arm Region No depletion  Thoracic and Lumbar Region Unable to assess  Buccal Region No depletion  Temple Region No depletion  Clavicle Bone Region No depletion  Clavicle and Acromion Bone Region No depletion  Scapular Bone Region No depletion  Dorsal Hand No depletion  Patellar Region No depletion  Anterior Thigh Region No depletion  Posterior Calf Region No depletion  Edema (RD Assessment) Mild  [BLE]  Hair Reviewed  [chemo-induced hair loss]  Eyes Reviewed  Mouth Reviewed  Skin Reviewed  Nails Reviewed       Diet Order:   Diet Order             DIET SOFT Room service appropriate? Yes; Fluid consistency: Thin  Diet effective now                   EDUCATION NEEDS:   No education  needs have been identified at this time  Skin:  Skin Assessment: Reviewed RN Assessment  Last BM:  1/22 per flow sheet documentation  Height:   Ht Readings from Last 1 Encounters:  12/15/21 _0  (1.727 m)    Weight:   Wt Readings from Last 1 Encounters:  12/15/21 68.8 kg     Estimated Nutritional Needs:  Kcal:  2100-2300 Protein:  105-120g Fluid:  2.1L/day     Jarome Matin, MS, RD, LDN Inpatient Clinical Dietitian RD pager # available in [facility]  After hours/weekend pager # available in [facility]

## 2021-12-21 NOTE — Progress Notes (Signed)
ANTICOAGULATION CONSULT NOTE - follow up  Pharmacy Consult for Heparin Indication: DVT  Allergies  Allergen Reactions   Naproxen Nausea Only   Bactrim [Sulfamethoxazole-Trimethoprim] Nausea Only    Patient Measurements: Height: 5\' 8"  (172.7 cm) Weight: 68.8 kg (151 lb 10.8 oz) IBW/kg (Calculated) : 63.9 Heparin Dosing Weight: TBW  Vital Signs: Temp: 98.6 F (37 C) (01/27 1900) Temp Source: Oral (01/27 1900) BP: 131/77 (01/27 1423) Pulse Rate: 82 (01/27 1800)  Labs: Recent Labs    12/19/21 0535 12/19/21 1548 12/20/21 0435 12/21/21 0104 12/21/21 0503 12/21/21 1627  HGB 7.3* 8.4* 7.8*  --  7.7*  --   HCT 21.4* 24.4* 22.7*  --  22.4*  --   PLT 48*  --  41*  --  32*  --   HEPARINUNFRC  --   --   --  <0.10*  --  0.11*  CREATININE 0.86  --  0.87  --  0.95  --      Estimated Creatinine Clearance: 71.5 mL/min (by C-G formula based on SCr of 0.95 mg/dL).   Medical History: Past Medical History:  Diagnosis Date   Ankle sprain    left   Anxiety    treated for panic attacks in the past.   Asthma    COPD (chronic obstructive pulmonary disease) ([place])    Depression    History of radiation therapy    Right lung- 09/26/21-11/13/21- Dr. Gery Pray   Irregular heart rate    Lung cancer [facility])    Renal disorder    kidney infections    Medications:  Infusions:   sodium chloride Stopped (12/19/21 1435)   ceFEPime (MAXIPIME) IV Stopped (12/21/21 1432)   famotidine (PEPCID) IV Stopped (12/21/21 1300)   heparin 1,600 Units/hr (12/21/21 1801)   promethazine (PHENERGAN) injection (IM or IVPB) Stopped (12/21/21 1155)    Assessment: 39 yoF admtted on 1/20 with N/V, respiratory failure.  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  Pharmacy is now consulted to dose Heparin  for acute upper extremity DVT in the setting of PICC line.     No recent anticoagulation.  Granix given 1/21- 1/25.  CBC:  Hgb remains low at 7.8, Plt decreased to  41k  Heparin drip was stopped to discuss with oncology about continuing care with heparin in light of platelets.   Oncology recommendation now is:  Target Goals ( plts) : hold for <25, aim for 0.3-0.5 less than 50, and standard dose above 50. No bolus at any point  Goal of Therapy:  Heparin level 0.3-0.5 units/ml Monitor platelets by anticoagulation protocol: Yes  Today, 12/21/21  Resume heparin with target goal of 0.3 to 0.5 as plt are 32  Hgb 7.7 SCr < 1, CrCl 71 ml/min  Most recent heparin level remains SUBtherapeutic on 1300 units/hr  Plan:  Platelets < 25k: hold heparin Platelets 25k - 50k: target HL 0.3-0.5 units/mL Platelets > 50k: target HL 0.3-0.7 units/mL NO bolus at any point  Increase heparin to 1600 units/hr Check anti-Xa level in 6 hours and daily while on heparin.  Note low goal range 0.3-0.5 units/ml, currently   Check daily CBC - continue to monitor H&H and platelets  Reuel Boom, PharmD, BCPS [phone] 12/21/2021, 8:09 PM

## 2021-12-21 NOTE — Progress Notes (Signed)
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  Currently on chemotherapy, last infusion on 12/06/2021.  Her nausea and vomiting has progressed and she has been unable to keep her medications down.  Patient denied fever, no chills, no headache, no diarrhea..   In the ED, temperature 98.4 F, HR 145, RR 18, BP 104/30, SPO2 100% on room air.  Sodium 133, potassium 3.7, chloride 102, CO2 16, glucose 145, BUN 22, creatinine 1.64, AST 17, ALT 13, total bilirubin 0.9.  WBC 0.3, hemoglobin 7.0, platelets 6.  High sensitivity troponin 3, <2.  Lactic acid 2.9.  Chest x-ray with no active cardiopulmonary disease process, similar small right suprahilar opacity.  CT angiogram chest with no evidence of pulmonary embolism, improving mediastinal/right hilar mass/adenopathy.  Oncology was consulted.  TRH consulted for further evaluation and management of intractable nausea/vomiting, pancytopenia.    Assessment & Plan:   Principal Problem:   Acute respiratory failure (HCC) Active Problems:   Pneumonia   Aspiration pneumonia (HCC)   Neutropenic fever (HCC)   Chemotherapy induced nausea and vomiting   Radiation-induced esophagitis   Pancytopenia (HCC)   Sinus tachycardia   Hypomagnesemia   Hypokalemia   Hypophosphatemia   Electrolyte abnormality   DVT of upper extremity (deep vein thrombosis) (HCC)   Hyponatremia   AKI (acute kidney  injury) ([place])   Malignant neoplasm of right upper lobe of lung (HCC)   Cancer related pain   Tobacco abuse   Goals of care, counseling/discussion   Dehydration   Chest pain   Hyperglycemia   Leukopenia due to antineoplastic chemotherapy (HCC)   Normocytic anemia   Generalized weakness   * Acute respiratory failure (HCC) Currently on 4 L [place]  CT 1/26 with circumferential wall thickening of RUL bronchus with intraluminal debris and subsequent bronchial narrowing, RLL bronchial thickening with intraluminal debris and adjacent peribronchovascular opacities in RLL - infectious vs inflammatory Seen by pulm, concern for element of vocal cord dysfunction or radiation induced upper airway inflammation Aggressive reflux treatment Reflux precautions bipap prn Lasix as tolerated per pulm  Will trial steroids Continue Cefepime Seems to be improving with lasix/steroids/abx - continue   Aspiration pneumonia ([place]) Continue cefepime, follow   Neutropenic fever ([place]) Last true fever 1/23 granix per oncology ANC 4.5 today, improved Requested oncology to see  Radiation-induced esophagitis PPI H2 blocker sucralfate  relfux precautions  Chemotherapy induced nausea and vomiting Continue antiemetics  Sinus tachycardia 2/2 above Started on low dose scheduled metop on 1/24 eICU scheduled PO metop 1/26 overnight Net positive ~12 L Echo with EF 60-65%, RVSF normal (see report) Suspect driven by respiratory issues above Repeat CT to r/o PE done last night given RUE DVT, negative for PE   Pancytopenia ([place])- (present on admission) Related to chemo Fluctuating and persistent thrombocytopenia and anemia WBC improved Trend counts, transfuse as appropriate  DVT of upper extremity (deep vein thrombosis) ([place]) Related to PICC Discussed with oncology given thrombocytopenia -> recommending holding anticoagulation for platelets <25,  plan to aim for lower range of therapeutic for heparin when  platelets 25-50, and standard dose anticoagulation for >50 --- will discuss with pharmacy.  Will leave picc for now     Electrolyte abnormality Replace potassium, magnesium, phosphorus as needed  AKI (acute kidney injury) ([place])- (present on admission) resolved  Hyponatremia- (present on admission) resolved  Cancer related pain Oxycontin, oxycodone, dilaudid for breakthrough  Malignant neoplasm of right upper lobe of lung ([place])- (present on admission) [place] with Dr. Julien Nordmann outpatient Last chemo 12/06/2021  Tobacco abuse Encouraged cessation  Goals of care, counseling/discussion Per prior notes, patient contemplating stopping chemo Appreciate palliative care assistance (1/24 note)   DVT prophylaxis: SCD Code Status: full Family Communication: mother Disposition:   Status is: Inpatient  Remains inpatient appropriate because: need for further inpatient care, goc discussions, IV steroids, IV abx       Consultants:  PCCM Oncology Palliative care  Procedures:  none  Antimicrobials:  Anti-infectives (From admission, onward)    Start     Dose/Rate Route Frequency Ordered Stop   12/18/21 0800  vancomycin (VANCOREADY) IVPB 1000 mg/200 mL  Status:  Discontinued        1,000 mg 200 mL/hr over 60 Minutes Intravenous Every 12 hours 12/17/21 1859 12/19/21 0912   12/17/21 2200  ceFEPIme (MAXIPIME) 2 g in sodium chloride 0.9 % 100 mL IVPB  Status:  Discontinued        2 g 200 mL/hr over 30 Minutes Intravenous Every 8 hours 12/17/21 1750 12/17/21 1756   12/17/21 1915  vancomycin (VANCOREADY) IVPB 1500 mg/300 mL        1,500 mg 150 mL/hr over 120 Minutes Intravenous  Once 12/17/21 1800 12/17/21 2057   12/17/21 1845  ceFEPIme (MAXIPIME) 2 g in sodium chloride 0.9 % 100 mL IVPB        2 g 200 mL/hr over 30 Minutes Intravenous Every 8 hours 12/17/21 1756         Subjective: C/o pain - refused long acting oxy for some reason last night? Feels  worse today, tearful at times  Objective: Vitals:   12/21/21 0700 12/21/21 0740 12/21/21 0800 12/21/21 0814  BP:      Pulse: 86  (!) 116   Resp: 12  (!) 23   Temp:  98.9 F (37.2 C)    TempSrc:  Oral    SpO2: 96%  97% 96%  Weight:      Height:        Intake/Output Summary (Last 24 hours) at 12/21/2021 0918 Last data filed at 12/21/2021 1610 Gross per 24 hour  Intake 717.15 ml  Output 1650 ml  Net -932.85 ml   Filed Weights   12/15/21 0117  Weight: 68.8 kg    Examination:  General: appears uncomfortable Cardiovascular: RRR Lungs: coarse breath sounds Abdomen: Soft, nontender, nondistended Neurological: Alert and oriented 3. Moves all extremities 4 . Cranial nerves II through XII grossly intact. Skin: Warm and dry. No rashes or lesions. Extremities: No clubbing or cyanosis. No edema.   Data Reviewed: I have personally reviewed following labs and imaging studies  CBC: Recent Labs  Lab 12/16/21 0304 12/16/21 2154 12/18/21 0237 12/18/21 2140 12/19/21 0535 12/19/21 1548 12/20/21 0435 12/21/21 0503  WBC 0.2*   < > 0.8* 1.5* 1.0*  --  3.2* 6.2  NEUTROABS 0.0*  --  0.2*  --  0.4*  --  1.4* 4.5  HGB 9.0*   < > 8.6* 7.9* 7.3* 8.4*  7.8* 7.7*  HCT 24.8*   < > 24.5* 23.9* 21.4* 24.4* 22.7* 22.4*  MCV 89.5   < > 92.5 95.2 95.1  --  95.0 94.5  PLT 9*   < > 6* 87* 48*  --  41* 32*   < > = values in this interval not displayed.    Basic Metabolic Panel: Recent Labs  Lab 12/17/21 0321 12/17/21 1701 12/18/21 0237 12/18/21 1208 12/19/21 0535 12/20/21 0435 12/21/21 0503  NA 133*   < > 134* 135 136 134* 135  K 3.1*   < > 4.2 3.7 3.2* 3.8 3.3*  CL 103   < > 107 104 102 102 100  CO2 20*   < > 19* 21* 26 25 25   GLUCOSE 119*   < > 113* 136* 114* 131* 126*  BUN 8   < > 10 12 14 20  25*  CREATININE 1.02*   < > 0.84 0.69 0.86 0.87 0.95  CALCIUM 8.2*   < > 8.3* 8.4* 8.5* 9.1 8.7*  MG 1.5*  --  1.5*  --  1.7 1.4* 1.9  PHOS 1.5*  --  2.4*  --  2.8 2.0* 2.9   < > = values  in this interval not displayed.    GFR: Estimated Creatinine Clearance: 71.5 mL/min (by C-G formula based on SCr of 0.95 mg/dL).  Liver Function Tests: Recent Labs  Lab 12/14/21 1626 12/15/21 0307 12/17/21 1701 12/20/21 0435 12/21/21 0503  AST 17 14* 11* 20 23  ALT 13 11 12 27  32  ALKPHOS 76 62 59 68 77  BILITOT 0.9 0.9 1.0 0.9 0.8  PROT 7.4 6.0* 6.7 6.4* 6.4*  ALBUMIN 3.5 2.9* 2.8* 2.7* 2.7*    CBG: No results for input(s): GLUCAP in the last 168 hours.   Recent Results (from the past 240 hour(s))  Culture, blood (routine x 2)     Status: None   Collection Time: 12/14/21  4:26 PM   Specimen: BLOOD RIGHT HAND  Result Value Ref Range Status   Specimen Description   Final    BLOOD RIGHT HAND Performed at [facility] [address] [place], [place] [address]    Special Requests   Final    BOTTLES DRAWN AEROBIC AND ANAEROBIC Blood Culture results may not be optimal due to an inadequate volume of blood received in culture bottles Performed at [facility] [address] [place], [place] [address]    Culture   Final    NO GROWTH 5 DAYS Performed at [facility], [place] [address] [place], [place] [address]    Report Status 12/19/2021 FINAL  Final  Culture, blood (routine x 2)     Status: None   Collection Time: 12/14/21  4:31 PM   Specimen: Left Antecubital; Blood  Result Value Ref Range Status   Specimen Description   Final    LEFT ANTECUBITAL Performed at [facility] [address] [place], [place] [address]    Special Requests   Final    BOTTLES DRAWN AEROBIC AND ANAEROBIC Blood Culture adequate volume Performed at [facility] [address], [place], [place] [address]    Culture   Final    NO GROWTH 5 DAYS Performed at [facility], [place] [address] [place], [place] [address]    Report Status 12/19/2021 FINAL  Final  Resp Panel by RT-PCR (Flu Tekeyah Santiago&B, Covid)  Nasopharyngeal Swab     Status: None  Collection Time: 12/14/21  4:38 PM   Specimen: Nasopharyngeal Swab; Nasopharyngeal(NP) swabs in vial transport medium  Result Value Ref Range Status   SARS Coronavirus 2 by RT PCR NEGATIVE NEGATIVE Final    Comment: (NOTE) SARS-CoV-2 target nucleic acids are NOT DETECTED.  The SARS-CoV-2 RNA is generally detectable in upper respiratory specimens during the acute phase of infection. The lowest concentration of SARS-CoV-2 viral copies this assay can detect is 138 copies/mL. Seanpatrick Maisano negative result does not preclude SARS-Cov-2 infection and should not be used as the sole basis for treatment or other patient management decisions. Slyvia Lartigue negative result may occur with  improper specimen collection/handling, submission of specimen other than nasopharyngeal swab, presence of viral mutation(s) within the areas targeted by this assay, and inadequate number of viral copies(<138 copies/mL). Hadja Harral negative result must be combined with clinical observations, patient history, and epidemiological information. The expected result is Negative.  Fact Sheet for Patients:  EntrepreneurPulse.com.au  Fact Sheet for Healthcare Providers:  IncredibleEmployment.be  This test is no t yet approved or cleared by the [place] FDA and  has been authorized for detection and/or diagnosis of SARS-CoV-2 by FDA under an Emergency Use Authorization (EUA). This EUA will remain  in effect (meaning this test can be used) for the duration of the COVID-19 declaration under Section 564(b)(1) of the Act, 21 U.S.C.section 360bbb-3(b)(1), unless the authorization is terminated  or revoked sooner.       Influenza [name] by PCR NEGATIVE NEGATIVE Final   Influenza B by PCR NEGATIVE NEGATIVE Final    Comment: (NOTE) The Xpert Xpress SARS-CoV-2/FLU/RSV plus assay is intended as an aid in the diagnosis of influenza from Nasopharyngeal swab specimens and should not be  used as [name] sole basis for treatment. Nasal washings and aspirates are unacceptable for Xpert Xpress SARS-CoV-2/FLU/RSV testing.  Fact Sheet for Patients: EntrepreneurPulse.com.au  Fact Sheet for Healthcare Providers: IncredibleEmployment.be  This test is not yet approved or cleared by the [place] FDA and has been authorized for detection and/or diagnosis of SARS-CoV-2 by FDA under an Emergency Use Authorization (EUA). This EUA will remain in effect (meaning this test can be used) for the duration of the COVID-19 declaration under Section 564(b)(1) of the Act, 21 U.S.C. section 360bbb-3(b)(1), unless the authorization is terminated or revoked.  Performed at [facility], [place] [address] [place], [place] [address]   Culture, blood (routine x 2)     Status: None (Preliminary result)   Collection Time: 12/17/21  6:32 PM   Specimen: BLOOD LEFT HAND  Result Value Ref Range Status   Specimen Description   Final    BLOOD LEFT HAND Performed at [facility] [address], [place], [place] [address]    Special Requests   Final    BOTTLES DRAWN AEROBIC ONLY Blood Culture results may not be optimal due to an inadequate volume of blood received in culture bottles Performed at [facility] [address], [place], [place] [address]    Culture   Final    NO GROWTH 4 DAYS Performed at [facility], [place] [address], [place], [place] [address]    Report Status PENDING  Incomplete  Culture, blood (routine x 2)     Status: None (Preliminary result)   Collection Time: 12/17/21  6:32 PM   Specimen: BLOOD LEFT FOREARM  Result Value Ref Range Status   Specimen Description   Final    BLOOD LEFT FOREARM Performed at [facility], [place] [address] [place], [place] [address]    Special Requests   Final    BOTTLES DRAWN AEROBIC ONLY Blood Culture results may not be optimal due  to an inadequate volume of blood received in culture bottles Performed at [facility] [address] [place], [place] [address]    Culture   Final    NO GROWTH 4 DAYS Performed at [facility], [place] [address] [place], [place] [address]    Report Status PENDING  Incomplete  Urine Culture     Status: Abnormal   Collection Time: 12/18/21  3:00 AM   Specimen: Urine, Clean Catch  Result Value Ref Range Status   Specimen Description   Final    URINE, CLEAN CATCH Performed at [facility], [place] [address], [place], [place] [address]    Special Requests   Final    Immunocompromised Performed at [facility], [place] [address] [place], [place] [address]    Culture (Paulo Keimig)  Final    <10,000 COLONIES/mL INSIGNIFICANT GROWTH Performed at [facility] [address] [place], [place] [address]    Report Status 12/19/2021 FINAL  Final  MRSA Next Gen by PCR, Nasal     Status: None   Collection Time: 12/19/21  5:40 AM   Specimen: Nasal Mucosa; Nasal Swab  Result Value Ref Range Status   MRSA by PCR Next Gen NOT DETECTED NOT DETECTED Final    Comment: (NOTE) The GeneXpert MRSA Assay (FDA approved for NASAL specimens only), is one component of Abijah Roussel comprehensive MRSA colonization surveillance program. It is not intended to diagnose MRSA infection nor to guide or monitor treatment for MRSA infections. Test performance is not FDA approved in patients less than 56 years old. Performed at [facility], [place] [address] [place], [place] [address]          Radiology Studies: CT Angio Chest Pulmonary Embolism (PE) W or WO Contrast  Result Date: 12/20/2021 CLINICAL DATA:  Shortness of breath. Cough. Tachycardia. DVT. History of lung cancer. EXAM: CT ANGIOGRAPHY CHEST WITH CONTRAST TECHNIQUE: Multidetector CT imaging of the chest was performed using the standard protocol during bolus administration of intravenous  contrast. Multiplanar CT image reconstructions and MIPs were obtained to evaluate the vascular anatomy. RADIATION DOSE REDUCTION: This exam was performed according to the departmental dose-optimization program which includes automated exposure control, adjustment of the mA and/or kV according to patient size and/or use of iterative reconstruction technique. CONTRAST:  35mL OMNIPAQUE IOHEXOL 350 MG/ML SOLN COMPARISON:  Radiograph yesterday. Chest CTA 6 days ago 12/24/2021, PET CT 10/12/2021 reviewed FINDINGS: Cardiovascular: Examination is diagnostic to the distal segmental level. No central pulmonary embolus. The subsegmental branches are not well assessed due to contrast bolus timing. The heart is normal in size. No pericardial effusion. Aortic atherosclerosis without dissection or acute aortic findings. Right upper extremity PICC tip in the lower SVC. There is contrast refluxing into the hepatic veins and IVC. Mediastinum/Nodes: Anterior paratracheal nodes measure up to 8 mm, not significantly changed from prior. There is Alany Borman 10 mm right hilar lymph node. Circumferential wall thickening of the mid and upper esophagus which is mildly improved. The distal esophagus is patulous. Lungs/Pleura: Emphysema. Right suprahilar nodule measures 18 mm, series 6, image 60. There is circumferential wall thickening of the right upper lobe bronchus with intraluminal debris and subsequent bronchial narrowing. Well demarcated subpleural opacity in the right upper lobe, series 6, image 30,  15 mm and not significantly changed from prior exam and prior PET. Left upper lobe 5 mm nodule series 6, image 37, unchanged. There is debris within the right lower lobe bronchus with bronchial thickening and adjacent peribronchovascular opacities in the right lower lobe, for example series 6, image 82. Basilar assessment is limited by motion. No significant pleural effusion. Upper Abdomen: No acute findings. Musculoskeletal: Healing right anterior  seventh and eighth rib fractures are unchanged. No new osseous findings. Review of the MIP images confirms the above findings. IMPRESSION: 1. No central pulmonary embolus. 2. There is contrast refluxing into the hepatic veins and IVC suggestive elevated right heart pressures. 3. Right suprahilar nodule measuring 18 mm, consistent with known malignancy. 4. There is circumferential wall thickening of the right upper lobe bronchus with intraluminal debris and subsequent bronchial narrowing. Right lower lobe bronchial thickening with intraluminal debris and adjacent peribronchovascular opacities in the right lower lobe, likely infectious or inflammatory. Given prominent esophageal thickening, the possibility of aspiration is raised. 5. Unchanged tiny left upper lobe nodule. Unchanged subpleural opacity at the right lung apex. 6. Emphysema. Aortic Atherosclerosis (ICD10-I70.0) and Emphysema (ICD10-J43.9). Electronically Signed   By: Keith Rake M.D.   On: 12/20/2021 17:54   ECHOCARDIOGRAM COMPLETE  Result Date: 12/20/2021    ECHOCARDIOGRAM REPORT   Patient Name:   [name] Date of Exam: 12/20/2021 Medical Rec #:  [id number]      Height:       68.0 in Accession #:    [id number]     Weight:       151.7 lb Date of Birth:  Jan 26, 1971      BSA:          1.817 m Patient Age:    46 years       BP:           143/94 mmHg Patient Gender: F              HR:           117 bpm. Exam Location:  Inpatient Procedure: 2D Echo Indications:    Elevated brain natriuretic peptide level  History:        Patient has prior history of Echocardiogram examinations and                 Patient has no prior history of Echocardiogram examinations.                 COPD. Irregular heartrate.  Sonographer:    Arlyss Gandy Referring Phys: [place]  1. Left ventricular ejection fraction, by estimation, is 60 to 65%. The left ventricle has normal function. The left ventricle has no regional wall motion  abnormalities. Left ventricular diastolic parameters were normal.  2. Right ventricular systolic function is normal. The right ventricular size is normal. Tricuspid regurgitation signal is inadequate for assessing PA pressure.  3. The pericardial effusion is anterior to the right ventricle.  4. The mitral valve is normal in structure. Trivial mitral valve regurgitation. No evidence of mitral stenosis.  5. The aortic valve is normal in structure. Aortic valve regurgitation is not visualized. No aortic stenosis is present.  6. The inferior vena cava is normal in size with <50% respiratory variability, suggesting right atrial pressure of 8 mmHg. FINDINGS  Left Ventricle: Left ventricular ejection fraction, by estimation, is 60 to 65%. The left ventricle has normal function. The left ventricle has no regional wall motion abnormalities. The left  ventricular internal cavity size was normal in size. There is  no left ventricular hypertrophy. Left ventricular diastolic parameters were normal. Normal left ventricular filling pressure. Right Ventricle: The right ventricular size is normal. No increase in right ventricular wall thickness. Right ventricular systolic function is normal. Tricuspid regurgitation signal is inadequate for assessing PA pressure. Left Atrium: Left atrial size was normal in size. Right Atrium: Right atrial size was normal in size. Pericardium: Trivial pericardial effusion is present. The pericardial effusion is anterior to the right ventricle. Mitral Valve: The mitral valve is normal in structure. Trivial mitral valve regurgitation. No evidence of mitral valve stenosis. Tricuspid Valve: The tricuspid valve is normal in structure. Tricuspid valve regurgitation is not demonstrated. No evidence of tricuspid stenosis. Aortic Valve: The aortic valve is normal in structure. Aortic valve regurgitation is not visualized. No aortic stenosis is present. Aortic valve mean gradient measures 3.0 mmHg. Aortic valve  peak gradient measures 6.0 mmHg. Aortic valve area, by VTI measures 2.03 cm. Pulmonic Valve: The pulmonic valve was normal in structure. Pulmonic valve regurgitation is trivial. No evidence of pulmonic stenosis. Aorta: The aortic root is normal in size and structure. Venous: The inferior vena cava is normal in size with less than 50% respiratory variability, suggesting right atrial pressure of 8 mmHg. IAS/Shunts: No atrial level shunt detected by color flow Doppler.  LEFT VENTRICLE PLAX 2D LVIDd:         4.40 cm   Diastology LVIDs:         3.60 cm   LV e' medial:    14.10 cm/s LV PW:         0.90 cm   LV E/e' medial:  8.5 LV IVS:        0.90 cm   LV e' lateral:   17.70 cm/s LVOT diam:     2.00 cm   LV E/e' lateral: 6.8 LV SV:         41 LV SV Index:   22 LVOT Area:     3.14 cm  RIGHT VENTRICLE             IVC RV Basal diam:  3.00 cm     IVC diam: 2.20 cm RV Mid diam:    3.60 cm RV S prime:     17.30 cm/s TAPSE (M-mode): 1.6 cm LEFT ATRIUM             Index        RIGHT ATRIUM          Index LA diam:        3.20 cm 1.76 cm/m   RA Area:     9.01 cm LA Vol (A2C):   33.7 ml 18.55 ml/m  RA Volume:   16.80 ml 9.25 ml/m LA Vol (A4C):   37.0 ml 20.36 ml/m LA Biplane Vol: 35.3 ml 19.43 ml/m  AORTIC VALVE AV Area (Vmax):    1.89 cm AV Area (Vmean):   1.84 cm AV Area (VTI):     2.03 cm AV Vmax:           122.00 cm/s AV Vmean:          86.000 cm/s AV VTI:            0.201 m AV Peak Grad:      6.0 mmHg AV Mean Grad:      3.0 mmHg LVOT Vmax:         73.50 cm/s LVOT Vmean:  50.500 cm/s LVOT VTI:          0.130 m LVOT/AV VTI ratio: 0.65  AORTA Ao Root diam: 3.20 cm Ao Asc diam:  3.20 cm MITRAL VALVE MV Area (PHT): 3.77 cm     SHUNTS MV Decel Time: 201 msec     Systemic VTI:  0.13 m MV E velocity: 120.00 cm/s  Systemic Diam: 2.00 cm Fransico Him MD Electronically signed by Fransico Him MD Signature Date/Time: 12/20/2021/2:34:21 PM    Final    VAS [place] UPPER EXTREMITY VENOUS DUPLEX  Result Date: 12/21/2021 UPPER  VENOUS STUDY  Patient Name:  [name]  Date of Exam:   12/20/2021 Medical Rec #: [id number]       Accession #:    [id number] Date of Birth: 07/02/1971       Patient Gender: F Patient Age:   26 years Exam Location:  [facility] Procedure:      VAS [place] UPPER EXTREMITY VENOUS DUPLEX Referring Phys: Annjanette Wertenberger POWELL JR --------------------------------------------------------------------------------  Indications: Edema Other Indications: PICC line in RUE. Risk Factors: CA patient on chemotherapy. Comparison Study: No previous exams Performing Technologist: Jody Hill RVT, RDMS  Examination Guidelines: Auriana Scalia complete evaluation includes B-mode imaging, spectral Doppler, color Doppler, and power Doppler as needed of all accessible portions of each vessel. Bilateral testing is considered an integral part of [place] [place] complete examination. Limited examinations for reoccurring indications may be performed as noted.  Right Findings: +----------+------------+---------+-----------+----------+---------------------+  RIGHT      Compressible Phasicity Spontaneous Properties        Summary         +----------+------------+---------+-----------+----------+---------------------+  IJV            Full        Yes        Yes                                       +----------+------------+---------+-----------+----------+---------------------+  Subclavian     Full        Yes        Yes                                       +----------+------------+---------+-----------+----------+---------------------+  Axillary       Full        Yes        Yes                                       +----------+------------+---------+-----------+----------+---------------------+  Brachial       None        No         No                 Acute - one of paired                                                                  brachials        +----------+------------+---------+-----------+----------+---------------------+  Radial  Full                                                              +----------+------------+---------+-----------+----------+---------------------+  Ulnar          Full                                                             +----------+------------+---------+-----------+----------+---------------------+  Cephalic       Full                                                             +----------+------------+---------+-----------+----------+---------------------+  Basilic      Partial       Yes        Yes                        Acute          +----------+------------+---------+-----------+----------+---------------------+  Left Findings: +----------+------------+---------+-----------+----------+-------+  LEFT       Compressible Phasicity Spontaneous Properties Summary  +----------+------------+---------+-----------+----------+-------+  Subclavian     Full        Yes        Yes                         +----------+------------+---------+-----------+----------+-------+  Summary:  Right: Findings consistent with acute deep vein thrombosis involving the right brachial veins. Findings consistent with acute superficial vein thrombosis involving the right basilic vein.  Left: No evidence of thrombosis in the subclavian.  *See table(s) above for measurements and observations.  Diagnosing physician: Jamelle Haring Electronically signed by Jamelle Haring on 12/21/2021 at 5:12:32 AM.    Final         Scheduled Meds:  acetaminophen (TYLENOL) oral liquid 160 mg/5 mL  1,000 mg Oral Q8H   chlorhexidine  15 mL Mouth Rinse BID   Chlorhexidine Gluconate Cloth  6 each Topical Daily   feeding supplement  1 Container Oral TID BM   furosemide  40 mg Intravenous Once   mouth rinse  15 mL Mouth Rinse q12n4p   methylPREDNISolone (SOLU-MEDROL) injection  40 mg Intravenous Q12H   metoprolol tartrate  12.5 mg Oral BID   multivitamin with minerals  1 tablet Oral Daily   nicotine  21 mg Transdermal Daily   oxyCODONE  20 mg Oral Q12H   pantoprazole (PROTONIX) IV  40  mg Intravenous Q12H   potassium chloride  40 mEq Oral Q4H   sodium chloride flush  10-40 mL Intracatheter Q12H   sucralfate  1 g Oral TID WC & HS   Continuous Infusions:  sodium chloride Stopped (12/19/21 1435)   ceFEPime (MAXIPIME) IV Stopped (12/21/21 0647)   famotidine (PEPCID) IV Stopped (12/20/21 1325)   promethazine (PHENERGAN) injection (IM or IVPB) Stopped (12/21/21 0525)     LOS: 7 days    Time spent: over  30 min    Fayrene Helper, MD [facility]   To contact the attending provider between 7A-7P or the covering provider during after hours 7P-7A, please log into the web site [email or web address] and access using universal [facility] password for that web site. If you do not have the password, please call the hospital operator.  12/21/2021, 9:18 AM

## 2021-12-22 DIAGNOSIS — J9601 Acute respiratory failure with hypoxia: Secondary | ICD-10-CM | POA: Diagnosis not present

## 2021-12-22 LAB — HEPARIN LEVEL (UNFRACTIONATED)
Heparin Unfractionated: 0.24 IU/mL — ABNORMAL LOW (ref 0.30–0.70)
Heparin Unfractionated: 0.53 IU/mL (ref 0.30–0.70)
Heparin Unfractionated: 0.54 IU/mL (ref 0.30–0.70)

## 2021-12-22 LAB — CBC WITH DIFFERENTIAL/PLATELET
Abs Immature Granulocytes: 0.84 10*3/uL — ABNORMAL HIGH (ref 0.00–0.07)
Basophils Absolute: 0.1 10*3/uL (ref 0.0–0.1)
Basophils Relative: 1 %
Eosinophils Absolute: 0 10*3/uL (ref 0.0–0.5)
Eosinophils Relative: 0 %
HCT: 21.4 % — ABNORMAL LOW (ref 36.0–46.0)
Hemoglobin: 7.1 g/dL — ABNORMAL LOW (ref 12.0–15.0)
Immature Granulocytes: 15 %
Lymphocytes Relative: 12 %
Lymphs Abs: 0.7 10*3/uL (ref 0.7–4.0)
MCH: 32 pg (ref 26.0–34.0)
MCHC: 33.2 g/dL (ref 30.0–36.0)
MCV: 96.4 fL (ref 80.0–100.0)
Monocytes Absolute: 1 10*3/uL (ref 0.1–1.0)
Monocytes Relative: 17 %
Neutro Abs: 3 10*3/uL (ref 1.7–7.7)
Neutrophils Relative %: 55 %
Platelets: 25 10*3/uL — CL (ref 150–400)
RBC: 2.22 MIL/uL — ABNORMAL LOW (ref 3.87–5.11)
RDW: 15.3 % (ref 11.5–15.5)
WBC: 5.6 10*3/uL (ref 4.0–10.5)
nRBC: 0.5 % — ABNORMAL HIGH (ref 0.0–0.2)

## 2021-12-22 LAB — CBC
HCT: 27.1 % — ABNORMAL LOW (ref 36.0–46.0)
Hemoglobin: 9 g/dL — ABNORMAL LOW (ref 12.0–15.0)
MCH: 31.3 pg (ref 26.0–34.0)
MCHC: 33.2 g/dL (ref 30.0–36.0)
MCV: 94.1 fL (ref 80.0–100.0)
Platelets: 54 10*3/uL — ABNORMAL LOW (ref 150–400)
RBC: 2.88 MIL/uL — ABNORMAL LOW (ref 3.87–5.11)
RDW: 14.6 % (ref 11.5–15.5)
WBC: 8.8 10*3/uL (ref 4.0–10.5)
nRBC: 0.6 % — ABNORMAL HIGH (ref 0.0–0.2)

## 2021-12-22 LAB — CULTURE, BLOOD (ROUTINE X 2)
Culture: NO GROWTH
Culture: NO GROWTH

## 2021-12-22 LAB — COMPREHENSIVE METABOLIC PANEL
ALT: 25 U/L (ref 0–44)
AST: 13 U/L — ABNORMAL LOW (ref 15–41)
Albumin: 2.5 g/dL — ABNORMAL LOW (ref 3.5–5.0)
Alkaline Phosphatase: 63 U/L (ref 38–126)
Anion gap: 7 (ref 5–15)
BUN: 28 mg/dL — ABNORMAL HIGH (ref 6–20)
CO2: 25 mmol/L (ref 22–32)
Calcium: 8.9 mg/dL (ref 8.9–10.3)
Chloride: 100 mmol/L (ref 98–111)
Creatinine, Ser: 1.05 mg/dL — ABNORMAL HIGH (ref 0.44–1.00)
GFR, Estimated: >60 mL/min (ref >60)
Glucose, Bld: 127 mg/dL — ABNORMAL HIGH (ref 70–99)
Potassium: 4 mmol/L (ref 3.5–5.1)
Sodium: 132 mmol/L — ABNORMAL LOW (ref 135–145)
Total Bilirubin: 0.9 mg/dL (ref 0.3–1.2)
Total Protein: 5.7 g/dL — ABNORMAL LOW (ref 6.5–8.1)

## 2021-12-22 LAB — MAGNESIUM: Magnesium: 1.5 mg/dL — ABNORMAL LOW (ref 1.7–2.4)

## 2021-12-22 LAB — PHOSPHORUS: Phosphorus: 2.9 mg/dL (ref 2.5–4.6)

## 2021-12-22 LAB — PREPARE RBC (CROSSMATCH)

## 2021-12-22 MED ORDER — HEPARIN (PORCINE) 25000 UT/250ML-% IV SOLN: 1650.0000 [IU]/h | INTRAVENOUS | Status: DC

## 2021-12-22 MED ORDER — FUROSEMIDE 10 MG/ML IJ SOLN
40.0000 mg | Freq: Once | INTRAMUSCULAR | Status: AC
Start: 2021-12-22 — End: 2021-12-22
  Administered 2021-12-22: 40 mg via INTRAVENOUS
  Filled 2021-12-22: qty 4

## 2021-12-22 MED ORDER — HEPARIN (PORCINE) 25000 UT/250ML-% IV SOLN
1550.0000 [IU]/h | INTRAVENOUS | Status: DC
  Administered 2021-12-22 – 2021-12-24 (×3): 1550 [IU]/h via INTRAVENOUS
  Filled 2021-12-22 (×4): qty 250

## 2021-12-22 MED ORDER — SODIUM CHLORIDE 0.9% IV SOLUTION: Freq: Once | INTRAVENOUS | Status: DC

## 2021-12-22 MED ORDER — SODIUM CHLORIDE 0.9% IV SOLUTION
Freq: Once | INTRAVENOUS | Status: AC
  Administered 2021-12-22: 10 mL/h via INTRAVENOUS

## 2021-12-22 NOTE — Progress Notes (Signed)
ANTICOAGULATION CONSULT NOTE - follow up  Pharmacy Consult for Heparin Indication: DVT  Allergies  Allergen Reactions   Naproxen Nausea Only   Bactrim [Sulfamethoxazole-Trimethoprim] Nausea Only    Patient Measurements: Height: 5\' 8"  (172.7 cm) Weight: 68.8 kg (151 lb 10.8 oz) IBW/kg (Calculated) : 63.9 Heparin Dosing Weight: TBW  Vital Signs: Temp: 97.8 F (36.6 C) (01/27 2300) Temp Source: Oral (01/27 2300) BP: 131/77 (01/27 1423) Pulse Rate: 111 (01/27 2050)  Labs: Recent Labs    12/19/21 0535 12/19/21 1548 12/20/21 0435 12/21/21 0104 12/21/21 0503 12/21/21 1627 12/21/21 2356  HGB 7.3* 8.4* 7.8*  --  7.7*  --   --   HCT 21.4* 24.4* 22.7*  --  22.4*  --   --   PLT 48*  --  41*  --  32*  --   --   HEPARINUNFRC  --   --   --  <0.10*  --  0.11* 0.24*  CREATININE 0.86  --  0.87  --  0.95  --   --      Estimated Creatinine Clearance: 71.5 mL/min (by C-G formula based on SCr of 0.95 mg/dL).   Medical History: Past Medical History:  Diagnosis Date   Ankle sprain    left   Anxiety    treated for panic attacks in the past.   Asthma    COPD (chronic obstructive pulmonary disease) ([place])    Depression    History of radiation therapy    Right lung- 09/26/21-11/13/21- Dr. Gery Pray   Irregular heart rate    Lung cancer [facility])    Renal disorder    kidney infections    Medications:  Infusions:   sodium chloride Stopped (12/19/21 1435)   ceFEPime (MAXIPIME) IV Stopped (12/21/21 2240)   famotidine (PEPCID) IV Stopped (12/21/21 1300)   heparin 1,600 Units/hr (12/21/21 1801)   promethazine (PHENERGAN) injection (IM or IVPB) Stopped (12/21/21 1155)    Assessment: [name] admtted on 1/20 with N/V, respiratory failure.  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  Pharmacy is now consulted to dose Heparin  for acute upper extremity DVT in the setting of PICC line.     No recent anticoagulation.  Granix given 1/21- 1/25.  CBC:  Hgb  remains low at 7.8, Plt decreased to 41k  Heparin drip was stopped to discuss with oncology about continuing care with heparin in light of platelets.   Oncology recommendation now is:  Target Goals ( plts) : hold for <25, aim for 0.3-0.5 less than 50, and standard dose above 50. No bolus at any point  Goal of Therapy:  Heparin level 0.3-0.5 units/ml Monitor platelets by anticoagulation protocol: Yes  Today, 12/22/21  Resume heparin with target goal of 0.3 to 0.5 as plt are 32  Hgb 7.7 SCr < 1, CrCl 71 ml/min  Most recent heparin level remains SUBtherapeutic on 1600 units/hr Per RN no bleeding  Plan:  Platelets < 25k: hold heparin Platelets 25k - 50k: target HL 0.3-0.5 units/mL Platelets > 50k: target HL 0.3-0.7 units/mL NO bolus at any point  Increase heparin to 1750 units/hr Check anti-Xa level in 6 hours and daily while on heparin.  Note low goal range 0.3-0.5 units/ml, currently   Check daily CBC - continue to monitor H&H and platelets  Dolly Rias RPh 12/22/2021, 12:29 AM

## 2021-12-22 NOTE — Progress Notes (Signed)
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  Currently on chemotherapy, last infusion on 12/06/2021.  Her nausea and vomiting has progressed and she has been unable to keep her medications down.  Patient denied fever, no chills, no headache, no diarrhea..   In the ED, temperature 98.4 F, HR 145, RR 18, BP 104/30, SPO2 100% on room air.  Sodium 133, potassium 3.7, chloride 102, CO2 16, glucose 145, BUN 22, creatinine 1.64, AST 17, ALT 13, total bilirubin 0.9.  WBC 0.3, hemoglobin 7.0, platelets 6.  High sensitivity troponin 3, <2.  Lactic acid 2.9.  Chest x-ray with no active cardiopulmonary disease process, similar small right suprahilar opacity.  CT angiogram chest with no evidence of pulmonary embolism, improving mediastinal/right hilar mass/adenopathy.  Oncology was consulted.  TRH consulted for further evaluation and management of intractable nausea/vomiting, pancytopenia.    Assessment & Plan:   Principal Problem:   Acute respiratory failure (HCC) Active Problems:   Pneumonia   Aspiration pneumonia (HCC)   Neutropenic fever (HCC)   Chemotherapy induced nausea and vomiting   Radiation-induced esophagitis   Pancytopenia (HCC)   Sinus tachycardia   Hypomagnesemia   Hypokalemia   Hypophosphatemia   Electrolyte abnormality   DVT of upper extremity (deep vein thrombosis) (HCC)   Hyponatremia   AKI (acute kidney  injury) ([place])   Malignant neoplasm of right upper lobe of lung ([place])   Cancer related pain   Tobacco abuse   Goals of care, counseling/discussion   Dehydration   Chest pain   Hyperglycemia   Leukopenia due to antineoplastic chemotherapy (HCC)   Normocytic anemia   Generalized weakness   * Acute respiratory failure (HCC) Currently on 2 L [place]  CT 1/26 with circumferential wall thickening of RUL bronchus with intraluminal debris and subsequent bronchial narrowing, RLL bronchial thickening with intraluminal debris and adjacent peribronchovascular opacities in RLL - infectious vs inflammatory Seen by pulm, concern for element of vocal cord dysfunction or radiation induced upper airway inflammation Aggressive reflux treatment Reflux precautions bipap prn Lasix as tolerated per pulm  Will trial steroids Continue Cefepime Seems to be improving with lasix/steroids/abx - continue    Aspiration pneumonia (HCC) Continue cefepime, follow   Neutropenic fever ([place]) Last true fever 1/23 granix per oncology Neutropenia resolved   Radiation-induced esophagitis PPI H2 blocker sucralfate  relfux precautions  Chemotherapy induced nausea and vomiting Continue antiemetics  Sinus tachycardia 2/2 above Started on low dose scheduled metop on 1/24 eICU scheduled PO metop 1/26 overnight Net positive ~12 L Echo with EF 60-65%, RVSF normal (see report) Suspect driven by respiratory issues above Repeat CT to r/o PE given RUE DVT, negative for PE   Pancytopenia ([place])- (present on admission) Related to chemo Fluctuating and persistent thrombocytopenia and anemia WBC improved Trend counts, transfuse as appropriate - will transfuse platelets and pRBC today  DVT of upper extremity (deep vein thrombosis) ([place]) Related to PICC Discussed with oncology given thrombocytopenia -> recommending holding anticoagulation for platelets <25,  plan to aim for lower range of therapeutic for heparin when  platelets 25-50, and standard dose anticoagulation for >50 Will leave picc for now Transfuse platelets today given downtrend - follow closely     Electrolyte abnormality Replace potassium, magnesium, phosphorus as needed  AKI (acute kidney injury) ([place])- (present on admission) resolved  Hyponatremia- (present on admission) resolved  Cancer related pain Oxycontin, oxycodone, dilaudid for breakthrough  Malignant neoplasm of right upper lobe of lung ([place])- (present on admission) Small Cell Lung Cancer Follows with Dr. Julien Nordmann outpatient Last chemo 12/06/2021  Tobacco abuse Encouraged cessation  Goals of care, counseling/discussion Per prior notes, patient contemplating stopping chemo Appreciate palliative care assistance (1/27 note)   DVT prophylaxis: SCD Code Status: full Family Communication: mother Disposition:   Status is: Inpatient  Remains inpatient appropriate because: need for further inpatient care, goc discussions, IV steroids, IV abx       Consultants:  PCCM Oncology Palliative care  Procedures:  none  Antimicrobials:  Anti-infectives (From admission, onward)    Start     Dose/Rate Route Frequency Ordered Stop   12/18/21 0800  vancomycin (VANCOREADY) IVPB 1000 mg/200 mL  Status:  Discontinued        1,000 mg 200 mL/hr over 60 Minutes Intravenous Every 12 hours 12/17/21 1859 12/19/21 0912   12/17/21 2200  ceFEPIme (MAXIPIME) 2 g in sodium chloride 0.9 % 100 mL IVPB  Status:  Discontinued        2 g 200 mL/hr over 30 Minutes Intravenous Every 8 hours 12/17/21 1750 12/17/21 1756   12/17/21 1915  vancomycin (VANCOREADY) IVPB 1500 mg/300 mL        1,500 mg 150 mL/hr over 120 Minutes Intravenous  Once 12/17/21 1800 12/17/21 2057   12/17/21 1845  ceFEPIme (MAXIPIME) 2 g in sodium chloride 0.9 % 100 mL IVPB        2 g 200 mL/hr over 30 Minutes Intravenous Every 8 hours 12/17/21 1756 12/24/21 2159       Subjective: Feels generally  better Discussed plan today  Objective: Vitals:   12/22/21 0300 12/22/21 0400 12/22/21 0734 12/22/21 0852  BP:  (!) 144/85  140/85  Pulse:  93  97  Resp:  (!) 21  16  Temp: 97.6 F (36.4 C)  98.1 F (36.7 C)   TempSrc: Oral  Oral   SpO2:  98%  97%  Weight:      Height:        Intake/Output Summary (Last 24 hours) at 12/22/2021 0939 Last data filed at 12/22/2021 0630 Gross per 24 hour  Intake 638.23 ml  Output 1450 ml  Net -811.77 ml   Filed Weights   12/15/21 0117  Weight: 68.8 kg    Examination:  General: No acute distress. Cardiovascular: RRR Lungs: improving lung sounds Abdomen: Soft, nontender, nondistended Neurological: Alert and oriented 3. Moves all extremities 4 . Cranial nerves II through XII grossly intact. Skin: Warm and dry. No rashes or lesions. Extremities: RUE edema, palpable radial pulse, ecchymoses noted  Data Reviewed: I have personally reviewed following labs and imaging studies  CBC: Recent Labs  Lab 12/18/21 0237 12/18/21 2140 12/19/21 0535 12/19/21 1548 12/20/21 0435 12/21/21 0503 12/22/21 0514  WBC 0.8* 1.5* 1.0*  --  3.2* 6.2 5.6  NEUTROABS 0.2*  --  0.4*  --  1.4* 4.5 3.0  HGB 8.6* 7.9* 7.3* 8.4* 7.8* 7.7* 7.1*  HCT 24.5* 23.9* 21.4* 24.4* 22.7* 22.4* 21.4*  MCV 92.5 95.2 95.1  --  95.0 94.5 96.4  PLT 6* 87* 48*  --  41* 32* 25*    Basic Metabolic Panel: Recent Labs  Lab 12/18/21 0237 12/18/21 1208 12/19/21 0535 12/20/21 0435 12/21/21 0503 12/22/21 0513 12/22/21 0514  NA 134* 135 136 134* 135  --  132*  K 4.2 3.7 3.2* 3.8 3.3*  --  4.0  CL 107 104 102 102 100  --  100  CO2 19* 21* 26 25 25   --  25  GLUCOSE 113* 136* 114* 131* 126*  --  127*  BUN 10 12 14 20  25*  --  28*  CREATININE 0.84 0.69 0.86 0.87 0.95  --  1.05*  CALCIUM 8.3* 8.4* 8.5* 9.1 8.7*  --  8.9  MG 1.5*  --  1.7 1.4* 1.9 1.5*  --   PHOS 2.4*  --  2.8 2.0* 2.9 2.9  --     GFR: Estimated Creatinine Clearance: 64.7 mL/min (Jo Booze) (by C-G formula based  on SCr of 1.05 mg/dL (H)).  Liver Function Tests: Recent Labs  Lab 12/17/21 1701 12/20/21 0435 12/21/21 0503 12/22/21 0514  AST 11* 20 23 13*  ALT 12 27 32 25  ALKPHOS 59 68 77 63  BILITOT 1.0 0.9 0.8 0.9  PROT 6.7 6.4* 6.4* 5.7*  ALBUMIN 2.8* 2.7* 2.7* 2.5*    CBG: No results for input(s): GLUCAP in the last 168 hours.   Recent Results (from the past 240 hour(s))  Culture, blood (routine x 2)     Status: None   Collection Time: 12/14/21  4:26 PM   Specimen: BLOOD RIGHT HAND  Result Value Ref Range Status   Specimen Description   Final    BLOOD RIGHT HAND Performed at [facility] [address] [place], [place] [address]    Special Requests   Final    BOTTLES DRAWN AEROBIC AND ANAEROBIC Blood Culture results may not be optimal due to an inadequate volume of blood received in culture bottles Performed at [facility] [address], [place], [place] [address]    Culture   Final    NO GROWTH 5 DAYS Performed at [facility], [place] [address], [place], [place] [address]    Report Status 12/19/2021 FINAL  Final  Culture, blood (routine x 2)     Status: None   Collection Time: 12/14/21  4:31 PM   Specimen: Left Antecubital; Blood  Result Value Ref Range Status   Specimen Description   Final    LEFT ANTECUBITAL Performed at [facility] [address] [place], [place] [address]    Special Requests   Final    BOTTLES DRAWN AEROBIC AND ANAEROBIC Blood Culture adequate volume Performed at [facility] [address] [place], [place] [address]    Culture   Final    NO GROWTH 5 DAYS Performed at [facility], [place] [address], [place],  [address]    Report Status 12/19/2021 FINAL  Final  Resp Panel by RT-PCR (Flu Yilin Weedon&B, Covid) Nasopharyngeal Swab     Status: None   Collection Time: 12/14/21  4:38 PM   Specimen: Nasopharyngeal Swab; Nasopharyngeal(NP) swabs in vial transport  medium  Result Value Ref Range Status   SARS Coronavirus 2 by RT PCR NEGATIVE NEGATIVE Final    Comment: (NOTE) SARS-CoV-2 target nucleic acids are NOT DETECTED.  The SARS-CoV-2 RNA is generally detectable in upper respiratory specimens during the acute phase of infection. The lowest concentration  of SARS-CoV-2 viral copies this assay can detect is 138 copies/mL. Rawson Minix negative result does not preclude SARS-Cov-2 infection and should not be used as the sole basis for treatment or other patient management decisions. Therron Sells negative result may occur with  improper specimen collection/handling, submission of specimen other than nasopharyngeal swab, presence of viral mutation(s) within the areas targeted by this assay, and inadequate number of viral copies(<138 copies/mL). Mordche Hedglin negative result must be combined with clinical observations, patient history, and epidemiological information. The expected result is Negative.  Fact Sheet for Patients:  EntrepreneurPulse.com.au  Fact Sheet for Healthcare Providers:  IncredibleEmployment.be  This test is no t yet approved or cleared by the [place] FDA and  has been authorized for detection and/or diagnosis of SARS-CoV-2 by FDA under an Emergency Use Authorization (EUA). This EUA will remain  in effect (meaning this test can be used) for the duration of the COVID-19 declaration under Section 564(b)(1) of the Act, 21 U.S.C.section 360bbb-3(b)(1), unless the authorization is terminated  or revoked sooner.       Influenza Paola Flynt by PCR NEGATIVE NEGATIVE Final   Influenza B by PCR NEGATIVE NEGATIVE Final    Comment: (NOTE) The Xpert Xpress SARS-CoV-2/FLU/RSV plus assay is intended as an aid in the diagnosis of influenza from Nasopharyngeal swab specimens and should not be used as Gracie Gupta sole basis for treatment. Nasal washings and aspirates are unacceptable for Xpert Xpress SARS-CoV-2/FLU/RSV testing.  Fact Sheet for  Patients: EntrepreneurPulse.com.au  Fact Sheet for Healthcare Providers: IncredibleEmployment.be  This test is not yet approved or cleared by the [place] FDA and has been authorized for detection and/or diagnosis of SARS-CoV-2 by FDA under an Emergency Use Authorization (EUA). This EUA will remain in effect (meaning this test can be used) for the duration of the COVID-19 declaration under Section 564(b)(1) of the Act, 21 U.S.C. section 360bbb-3(b)(1), unless the authorization is terminated or revoked.  Performed at [facility], [place] [address], [place], [place] [address]   Culture, blood (routine x 2)     Status: None   Collection Time: 12/17/21  6:32 PM   Specimen: BLOOD LEFT HAND  Result Value Ref Range Status   Specimen Description   Final    BLOOD LEFT HAND Performed at [facility] [address], [place], [place] [address]    Special Requests   Final    BOTTLES DRAWN AEROBIC ONLY Blood Culture results may not be optimal due to an inadequate volume of blood received in culture bottles Performed at [facility] [address] [place], [place] [address]    Culture   Final    NO GROWTH 5 DAYS Performed at [facility], [place] [address], [place], [place] [address]    Report Status 12/22/2021 FINAL  Final  Culture, blood (routine x 2)     Status: None   Collection Time: 12/17/21  6:32 PM   Specimen: BLOOD LEFT FOREARM  Result Value Ref Range Status   Specimen Description   Final    BLOOD LEFT FOREARM Performed at [facility] [address] [place], [place] [address]    Special Requests   Final    BOTTLES DRAWN AEROBIC ONLY Blood Culture results may not be optimal due to an inadequate volume of blood received in culture bottles Performed at [facility] [address] [place], [place] [address]    Culture   Final    NO GROWTH  5  DAYS Performed at [facility], [place] [address], [place], [place] [address]    Report Status 12/22/2021 FINAL  Final  Urine Culture     Status: Abnormal   Collection Time: 12/18/21  3:00 AM   Specimen: Urine, Clean Catch  Result Value Ref Range Status   Specimen Description   Final    URINE, CLEAN CATCH Performed at [facility], [place] [address], [place], [place] [address]    Special Requests   Final    Immunocompromised Performed at [facility], [place] [address], [place], [place] [address]    Culture (Morgin Halls)  Final    <10,000 COLONIES/mL INSIGNIFICANT GROWTH Performed at [facility] [address], [place], [place] [address]    Report Status 12/19/2021 FINAL  Final  MRSA Next Gen by PCR, Nasal     Status: None   Collection Time: 12/19/21  5:40 AM   Specimen: Nasal Mucosa; Nasal Swab  Result Value Ref Range Status   MRSA by PCR Next Gen NOT DETECTED NOT DETECTED Final    Comment: (NOTE) The GeneXpert MRSA Assay (FDA approved for NASAL specimens only), is one component of Navdeep Halt comprehensive MRSA colonization surveillance program. It is not intended to diagnose MRSA infection nor to guide or monitor treatment for MRSA infections. Test performance is not FDA approved in patients less than 67 years old. Performed at [facility], [place] [address], [place],  [address]          Radiology Studies: CT Angio Chest Pulmonary Embolism (PE) W or WO Contrast  Result Date: 12/20/2021 CLINICAL DATA:  Shortness of breath. Cough. Tachycardia. DVT. History of lung cancer. EXAM: CT ANGIOGRAPHY CHEST WITH CONTRAST TECHNIQUE: Multidetector CT imaging of the chest was performed using the standard protocol during bolus administration of intravenous contrast. Multiplanar CT image reconstructions and MIPs were obtained to evaluate the vascular anatomy. RADIATION DOSE REDUCTION: This exam was performed according to the departmental  dose-optimization program which includes automated exposure control, adjustment of the mA and/or kV according to patient size and/or use of iterative reconstruction technique. CONTRAST:  59mL OMNIPAQUE IOHEXOL 350 MG/ML SOLN COMPARISON:  Radiograph yesterday. Chest CTA 6 days ago 12/24/2021, PET CT 10/12/2021 reviewed FINDINGS: Cardiovascular: Examination is diagnostic to the distal segmental level. No central pulmonary embolus. The subsegmental branches are not well assessed due to contrast bolus timing. The heart is normal in size. No pericardial effusion. Aortic atherosclerosis without dissection or acute aortic findings. Right upper extremity PICC tip in the lower SVC. There is contrast refluxing into the hepatic veins and IVC. Mediastinum/Nodes: Anterior paratracheal nodes measure up to 8 mm, not significantly changed from prior. There is Yasamin Karel 10 mm right hilar lymph node. Circumferential wall thickening of the mid and upper esophagus which is mildly improved. The distal esophagus is patulous. Lungs/Pleura: Emphysema. Right suprahilar nodule measures 18 mm, series 6, image 60. There is circumferential wall thickening of the right upper lobe bronchus with intraluminal debris and subsequent bronchial narrowing. Well demarcated subpleural opacity in the right upper lobe, series 6, image 30, 15 mm and not significantly changed from prior exam and prior PET. Left upper lobe 5 mm nodule series 6, image 37, unchanged. There is debris within the right lower lobe bronchus with bronchial thickening and adjacent peribronchovascular opacities in the right lower lobe, for example series 6, image 82. Basilar assessment is limited by motion. No significant pleural effusion. Upper Abdomen: No acute findings. Musculoskeletal: Healing right  anterior seventh and eighth rib fractures are unchanged. No new osseous findings. Review of the MIP images confirms the above findings. IMPRESSION: 1. No central pulmonary embolus. 2. There is  contrast refluxing into the hepatic veins and IVC suggestive elevated right heart pressures. 3. Right suprahilar nodule measuring 18 mm, consistent with known malignancy. 4. There is circumferential wall thickening of the right upper lobe bronchus with intraluminal debris and subsequent bronchial narrowing. Right lower lobe bronchial thickening with intraluminal debris and adjacent peribronchovascular opacities in the right lower lobe, likely infectious or inflammatory. Given prominent esophageal thickening, the possibility of aspiration is raised. 5. Unchanged tiny left upper lobe nodule. Unchanged subpleural opacity at the right lung apex. 6. Emphysema. Aortic Atherosclerosis (ICD10-I70.0) and Emphysema (ICD10-J43.9). Electronically Signed   By: Keith Rake M.D.   On: 12/20/2021 17:54   ECHOCARDIOGRAM COMPLETE  Result Date: 12/20/2021    ECHOCARDIOGRAM REPORT   Patient Name:   [name] Date of Exam: 12/20/2021 Medical Rec #:  [id number]      Height:       68.0 in Accession #:    [id number]     Weight:       151.7 lb Date of Birth:  [birth year]      BSA:          1.817 m Patient Age:    67 years       BP:           143/94 mmHg Patient Gender: F              HR:           117 bpm. Exam Location:  Inpatient Procedure: 2D Echo Indications:    Elevated brain natriuretic peptide level  History:        Patient has prior history of Echocardiogram examinations and                 Patient has no prior history of Echocardiogram examinations.                 COPD. Irregular heartrate.  Sonographer:    Arlyss Gandy Referring Phys: [place]  1. Left ventricular ejection fraction, by estimation, is 60 to 65%. The left ventricle has normal function. The left ventricle has no regional wall motion abnormalities. Left ventricular diastolic parameters were normal.  2. Right ventricular systolic function is normal. The right ventricular size is normal. Tricuspid regurgitation signal is  inadequate for assessing PA pressure.  3. The pericardial effusion is anterior to the right ventricle.  4. The mitral valve is normal in structure. Trivial mitral valve regurgitation. No evidence of mitral stenosis.  5. The aortic valve is normal in structure. Aortic valve regurgitation is not visualized. No aortic stenosis is present.  6. The inferior vena cava is normal in size with <50% respiratory variability, suggesting right atrial pressure of 8 mmHg. FINDINGS  Left Ventricle: Left ventricular ejection fraction, by estimation, is 60 to 65%. The left ventricle has normal function. The left ventricle has no regional wall motion abnormalities. The left ventricular internal cavity size was normal in size. There is  no left ventricular hypertrophy. Left ventricular diastolic parameters were normal. Normal left ventricular filling pressure. Right Ventricle: The right ventricular size is normal. No increase in right ventricular wall thickness. Right ventricular systolic function is normal. Tricuspid regurgitation signal is inadequate for assessing PA pressure. Left Atrium: Left atrial size was normal in size. Right Atrium: Right  atrial size was normal in size. Pericardium: Trivial pericardial effusion is present. The pericardial effusion is anterior to the right ventricle. Mitral Valve: The mitral valve is normal in structure. Trivial mitral valve regurgitation. No evidence of mitral valve stenosis. Tricuspid Valve: The tricuspid valve is normal in structure. Tricuspid valve regurgitation is not demonstrated. No evidence of tricuspid stenosis. Aortic Valve: The aortic valve is normal in structure. Aortic valve regurgitation is not visualized. No aortic stenosis is present. Aortic valve mean gradient measures 3.0 mmHg. Aortic valve peak gradient measures 6.0 mmHg. Aortic valve area, by VTI measures 2.03 cm. Pulmonic Valve: The pulmonic valve was normal in structure. Pulmonic valve regurgitation is trivial. No  evidence of pulmonic stenosis. Aorta: The aortic root is normal in size and structure. Venous: The inferior vena cava is normal in size with less than 50% respiratory variability, suggesting right atrial pressure of 8 mmHg. IAS/Shunts: No atrial level shunt detected by color flow Doppler.  LEFT VENTRICLE PLAX 2D LVIDd:         4.40 cm   Diastology LVIDs:         3.60 cm   LV e' medial:    14.10 cm/s LV PW:         0.90 cm   LV E/e' medial:  8.5 LV IVS:        0.90 cm   LV e' lateral:   17.70 cm/s LVOT diam:     2.00 cm   LV E/e' lateral: 6.8 LV SV:         41 LV SV Index:   22 LVOT Area:     3.14 cm  RIGHT VENTRICLE             IVC RV Basal diam:  3.00 cm     IVC diam: 2.20 cm RV Mid diam:    3.60 cm RV S prime:     17.30 cm/s TAPSE (M-mode): 1.6 cm LEFT ATRIUM             Index        RIGHT ATRIUM          Index LA diam:        3.20 cm 1.76 cm/m   RA Area:     9.01 cm LA Vol (A2C):   33.7 ml 18.55 ml/m  RA Volume:   16.80 ml 9.25 ml/m LA Vol (A4C):   37.0 ml 20.36 ml/m LA Biplane Vol: 35.3 ml 19.43 ml/m  AORTIC VALVE AV Area (Vmax):    1.89 cm AV Area (Vmean):   1.84 cm AV Area (VTI):     2.03 cm AV Vmax:           122.00 cm/s AV Vmean:          86.000 cm/s AV VTI:            0.201 m AV Peak Grad:      6.0 mmHg AV Mean Grad:      3.0 mmHg LVOT Vmax:         73.50 cm/s LVOT Vmean:        50.500 cm/s LVOT VTI:          0.130 m LVOT/AV VTI ratio: 0.65  AORTA Ao Root diam: 3.20 cm Ao Asc diam:  3.20 cm MITRAL VALVE MV Area (PHT): 3.77 cm     SHUNTS MV Decel Time: 201 msec     Systemic VTI:  0.13 m MV E velocity: 120.00 cm/s  Systemic Diam:  2.00 cm Fransico Him MD Electronically signed by Fransico Him MD Signature Date/Time: 12/20/2021/2:34:21 PM    Final    VAS [place] UPPER EXTREMITY VENOUS DUPLEX  Result Date: 12/21/2021 UPPER VENOUS STUDY  Patient Name:  [name]  Date of Exam:   12/20/2021 Medical Rec #: [id number]       Accession #:    [id number] Date of Birth: 1971/07/09       Patient Gender: F  Patient Age:   53 years Exam Location:  [facility] Procedure:      VAS [place] UPPER EXTREMITY VENOUS DUPLEX Referring Phys: Shatavia Santor POWELL JR --------------------------------------------------------------------------------  Indications: Edema Other Indications: PICC line in RUE. Risk Factors: CA patient on chemotherapy. Comparison Study: No previous exams Performing Technologist: Jody Hill RVT, RDMS  Examination Guidelines: Stevie Charter complete evaluation includes B-mode imaging, spectral Doppler, color Doppler, and power Doppler as needed of all accessible portions of each vessel. Bilateral testing is considered an integral part of Cobi Delph complete examination. Limited examinations for reoccurring indications may be performed as noted.  Right Findings: +----------+------------+---------+-----------+----------+---------------------+  RIGHT      Compressible Phasicity Spontaneous Properties        Summary         +----------+------------+---------+-----------+----------+---------------------+  IJV            Full        Yes        Yes                                       +----------+------------+---------+-----------+----------+---------------------+  Subclavian     Full        Yes        Yes                                       +----------+------------+---------+-----------+----------+---------------------+  Axillary       Full        Yes        Yes                                       +----------+------------+---------+-----------+----------+---------------------+  Brachial       None        No         No                 Acute - one of paired                                                                  brachials        +----------+------------+---------+-----------+----------+---------------------+  Radial         Full                                                             +----------+------------+---------+-----------+----------+---------------------+  Ulnar          Full                                                              +----------+------------+---------+-----------+----------+---------------------+  Cephalic       Full                                                             +----------+------------+---------+-----------+----------+---------------------+  Basilic      Partial       Yes        Yes                        Acute          +----------+------------+---------+-----------+----------+---------------------+  Left Findings: +----------+------------+---------+-----------+----------+-------+  LEFT       Compressible Phasicity Spontaneous Properties Summary  +----------+------------+---------+-----------+----------+-------+  Subclavian     Full        Yes        Yes                         +----------+------------+---------+-----------+----------+-------+  Summary:  Right: Findings consistent with acute deep vein thrombosis involving the right brachial veins. Findings consistent with acute superficial vein thrombosis involving the right basilic vein.  Left: No evidence of thrombosis in the subclavian.  *See table(s) above for measurements and observations.  Diagnosing physician: Jamelle Haring Electronically signed by Jamelle Haring on 12/21/2021 at 5:12:32 AM.    Final         Scheduled Meds:  sodium chloride   Intravenous Once   sodium chloride   Intravenous Once   acetaminophen (TYLENOL) oral liquid 160 mg/5 mL  1,000 mg Oral Q8H   chlorhexidine  15 mL Mouth Rinse BID   Chlorhexidine Gluconate Cloth  6 each Topical Daily   feeding supplement  237 mL Oral BID BM   furosemide  40 mg Intravenous Once   mouth rinse  15 mL Mouth Rinse q12n4p   methylPREDNISolone (SOLU-MEDROL) injection  40 mg Intravenous Q12H   metoprolol tartrate  12.5 mg Oral BID   multivitamin with minerals  1 tablet Oral Daily   nicotine  21 mg Transdermal Daily   oxyCODONE  20 mg Oral Q12H   pantoprazole (PROTONIX) IV  40 mg Intravenous Q12H   sodium chloride flush  10-40 mL Intracatheter Q12H   sucralfate  1 g Oral TID WC &  HS   Continuous Infusions:  sodium chloride Stopped (12/19/21 1435)   ceFEPime (MAXIPIME) IV Stopped (12/22/21 0758)   famotidine (PEPCID) IV Stopped (12/21/21 1300)   heparin 1,650 Units/hr (12/22/21 0924)   promethazine (PHENERGAN) injection (IM or IVPB) Stopped (12/21/21 1155)     LOS: 8 days    Time spent: over 91 min    Fayrene Helper, MD [facility]   To contact the attending provider between 7A-7P or the covering provider during after hours 7P-7A, please log into the web site [email or web address] and access using universal [place] password for that web site. If you do not  have the password, please call the hospital operator.  12/22/2021, 9:39 AM

## 2021-12-22 NOTE — Progress Notes (Signed)
Daily Progress Note   Patient Name: [name]       Date: 12/22/2021 DOB: [birth year]  Age: 51 y.o. MRN#: [id number] Attending Physician: [name]., * Primary Care Physician: [name] Admit Date: 12/14/2021  Reason for Consultation/Follow-up: Establishing goals of care  Subjective: I saw and examined [name] this evening.  She was more awake today and was fully able to participate in conversation.  She tells me that the most important things to her are her family and her faith.  She particularly finds joy in her 57-year-old granddaughter.  She became tearful and said that it is not fair to her granddaughter that she has been sick and not really able to enjoy time with her ever since she started chemotherapy/radiation.  She said that this is a driving factor in her thought process to forego further chemotherapy.  She states that she may change her mind about this, however, she feels as though she has only had complications since starting with treatment for her cancer.  She expressed that she was told this is a potentially curable condition, but her treatment was supposed to be finished in late December/early January but we are now at the end of January and she is still getting worse rather than better.  She then stated that she has been talking with people about "natural treatments" and states that she may explore these rather than proceeding with standard medical interventions.  We discussed wishes moving forward in regard to advanced directives.  Concepts specific to code status and rehospitalization discussed and how to go about decision making to focus on things that are likely to allow her to spend time at home if she is at the point where she is not interested  in further disease modifying therapy.  We discussed difference between a aggressive medical intervention path and a palliative, comfort focused care path.  Values and goals of care important to patient and family were attempted to be elicited.  Discussed that if she does make decision to forego further chemotherapy, we should also discuss what other intervention she may not want.  We specifically noted these may include things like ICU level care, pressors, ventilator support, BiPAP, or CPR.  She was appropriately tearful during this discussion and states she will consider over the next  few days.   Questions and concerns addressed.   PMT will continue to support holistically.   Length of Stay: 8  Current Medications: Scheduled Meds:   sodium chloride   Intravenous Once   acetaminophen (TYLENOL) oral liquid 160 mg/5 mL  1,000 mg Oral Q8H   chlorhexidine  15 mL Mouth Rinse BID   Chlorhexidine Gluconate Cloth  6 each Topical Daily   feeding supplement  237 mL Oral BID BM   mouth rinse  15 mL Mouth Rinse q12n4p   methylPREDNISolone (SOLU-MEDROL) injection  40 mg Intravenous Q12H   metoprolol tartrate  12.5 mg Oral BID   multivitamin with minerals  1 tablet Oral Daily   nicotine  21 mg Transdermal Daily   oxyCODONE  20 mg Oral Q12H   pantoprazole (PROTONIX) IV  40 mg Intravenous Q12H   sodium chloride flush  10-40 mL Intracatheter Q12H   sucralfate  1 g Oral TID WC & HS    Continuous Infusions:  sodium chloride Stopped (12/19/21 1435)   ceFEPime (MAXIPIME) IV Stopped (12/22/21 0758)   famotidine (PEPCID) IV Stopped (12/21/21 1300)   heparin 1,750 Units/hr (12/22/21 0626)   promethazine (PHENERGAN) injection (IM or IVPB) Stopped (12/21/21 1155)    PRN Meds: sodium chloride, acetaminophen (TYLENOL) oral liquid 160 mg/5 mL **FOLLOWED BY** [START ON 12/24/2021] acetaminophen (TYLENOL) oral liquid 160 mg/5 mL, ALPRAZolam, benzonatate, chlorpheniramine-HYDROcodone, HYDROmorphone (DILAUDID)  injection, levalbuterol, metoprolol tartrate, ondansetron (ZOFRAN) IV, oxyCODONE, promethazine **OR** promethazine (PHENERGAN) injection (IM or IVPB), sodium chloride flush  Physical Exam         General: Alert, awake, chronically ill-appearing.   HEENT: No bruits, no goiter, no JVD Heart: Tachycardic. No murmur appreciated. Lungs: Fair air movement, coarse throughout Abdomen: Soft, nontender, nondistended, positive bowel sounds.   Ext: No significant edema Skin: Warm and dry Neuro: Grossly intact, nonfocal.  Vital Signs: BP 140/85 (BP Location: Left Arm)    Pulse 97    Temp 97.6 F (36.4 C) (Oral)    Resp 16    Ht 5\' 8"  (1.727 m)    Wt 68.8 kg    LMP 04/24/2018    SpO2 97%    BMI 23.06 kg/m  SpO2: SpO2: 97 % O2 Device: O2 Device: Nasal Cannula O2 Flow Rate: O2 Flow Rate (L/min): 2 L/min  Intake/output summary:  Intake/Output Summary (Last 24 hours) at 12/22/2021 0859 Last data filed at 12/22/2021 0630 Gross per 24 hour  Intake 878.23 ml  Output 1450 ml  Net -571.77 ml    LBM: Last BM Date: 12/16/21 Baseline Weight: Weight: 68.8 kg Most recent weight: Weight: 68.8 kg       Palliative Assessment/Data:    Flowsheet Rows    Flowsheet Row Most Recent Value  Intake Tab   Referral Department Hospitalist  Unit at Time of Referral ICU  Palliative Care Primary Diagnosis Cancer  Date Notified 12/17/21  Palliative Care Type New Palliative care  Reason for referral Clarify Goals of Care  Date of Admission 12/14/21  Date first seen by Palliative Care 12/18/21  # of days Palliative referral response time 1 Day(s)  # of days IP prior to Palliative referral 3  Clinical Assessment   Palliative Performance Scale Score 40%  Psychosocial & Spiritual Assessment   Palliative Care Outcomes   Patient/Family meeting held? Yes  Who was at the meeting? Patient  Palliative Care Outcomes Clarified goals of care       Patient Active Problem List   Diagnosis Date Noted  DVT of upper  extremity (deep vein thrombosis) ([place]) 12/20/2021   Hypophosphatemia 12/19/2021   Electrolyte abnormality 12/19/2021   Cancer related pain 12/19/2021   Tobacco abuse 12/19/2021   Sinus tachycardia 12/19/2021   Neutropenic fever ([place]) 12/18/2021   Acute on chronic respiratory failure with hypoxia ([place]) 12/18/2021   Pneumonia 12/18/2021   Acute respiratory failure ([place]) 12/18/2021   Aspiration pneumonia ([place]) 12/18/2021   Radiation-induced esophagitis 12/18/2021   Chemotherapy induced nausea and vomiting 12/14/2021   Chest pain 12/14/2021   Hypomagnesemia 12/14/2021   Hypokalemia 12/14/2021   Hyponatremia 12/14/2021   Hyperglycemia 12/14/2021   Leukopenia due to antineoplastic chemotherapy ([place]) 12/14/2021   Pancytopenia ([place]) 12/14/2021   Normocytic anemia 12/14/2021   AKI (acute kidney injury) ([place]) 12/14/2021   Generalized weakness 12/14/2021   Dehydration 11/13/2021   Goals of care, counseling/discussion 09/28/2021   Malignant neoplasm of right upper lobe of lung ([place]) 09/05/2021   Status post lumbar microdiscectomy 06/24/2018   Left lumbar radiculitis 04/25/2017   Lumbar degenerative disc disease 04/25/2017    Palliative Care Assessment & Plan   Patient Profile: 51 y.o. 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  She was transferred to PICU due to increased work of breathing.  She continues to suffer from acute hypoxemic respiratory failure due to left lower lobe pneumonia.  She has been evaluated by and recommendation for continuation of antibiotics BiPAP.  She also has radiation-induced esophagitis.  She is reported she is not interested in further chemotherapy.  Palliative consulted for goals of care.  Recommendations/Plan: Full code/full scope She continues to state that she is  not interested in further chemotherapy at this point, however, she does say that she may change her mind about this based upon how she continues to feel. She is looking into other "natural treatments" for cancer therapy. Discussed consideration for other limitations of care, particularly if she is not going to pursue further standard medical interventions.  Specifically discussed CODE STATUS.  She will continue to consider. Palliative to follow-up to continue conversation based upon clinical course.  Goals of Care and Additional Recommendations: Limitations on Scope of Treatment: Full Scope Treatment  Code Status:    Code Status Orders  (From admission, onward)           Start     Ordered   12/14/21 2118  Full code  Continuous        12/14/21 2118           Code Status History     Date Active Date Inactive Code Status Order ID Comments User Context   05/13/2018 1737 05/14/2018 1211 Full Code [id number]  Lanae Crumbly, PA-C Inpatient       Prognosis:  Unable to determine  Discharge Planning: To Be Determined  Care plan was discussed with patient  Total time: 60-minutes  Thank you for allowing the Palliative Medicine Team to assist in the care of this patient.  Micheline Rough, MD  Please contact Palliative Medicine Team phone at [phone] for questions and concerns.

## 2021-12-22 NOTE — Plan of Care (Signed)
°  Problem: Education: Goal: Knowledge of General Education information will improve Description: Including pain rating scale, medication(s)/side effects and non-pharmacologic comfort measures Outcome: Progressing   Problem: Clinical Measurements: Goal: Respiratory complications will improve Outcome: Progressing   Problem: Coping: Goal: Level of anxiety will decrease Outcome: Progressing   Problem: Pain Managment: Goal: General experience of comfort will improve Outcome: Not Progressing

## 2021-12-22 NOTE — Progress Notes (Signed)
Pharmacy Brief Note - Anticoagulation Evening Follow Up:  Patient is a 41 yoF on heparin infusion for acute upper extremity DVT in setting of PICC line. Pt has thrombocytopenia s/p chemotherapy. For full history, see note by Royetta Asal, PharmD from earlier today.   Assessment: HL = 0.53 remains slightly above goal and unchanged despite previous rate decrease to 1650 units/hr Confirmed with RN that heparin infusing at correct rate. Confirmed lab drawn correctly - heparin infusing through peripheral IV in the left forearm, HL drawn from PICC. No signs of bleeding.   Goal: HL 0.3 - 0.5 currently based on Plt count  Plan: Decrease heparin infusion to 1550 units/hr Check 6 hour HL CBC, HL daily while on heparin infusion Monitor for signs of bleeding  Lenis Noon, PharmD 12/22/21 5:36 PM

## 2021-12-22 NOTE — Progress Notes (Signed)
ANTICOAGULATION CONSULT NOTE - follow up  Pharmacy Consult for Heparin Indication: DVT  Allergies  Allergen Reactions   Naproxen Nausea Only   Bactrim [Sulfamethoxazole-Trimethoprim] Nausea Only    Patient Measurements: Height: 5\' 8"  (172.7 cm) Weight: 68.8 kg (151 lb 10.8 oz) IBW/kg (Calculated) : 63.9 Heparin Dosing Weight: TBW  Vital Signs: Temp: 98.1 F (36.7 C) (01/28 0734) Temp Source: Oral (01/28 0734) BP: 140/85 (01/28 0852) Pulse Rate: 97 (01/28 0852)  Labs: Recent Labs    12/20/21 0435 12/21/21 0104 12/21/21 0503 12/21/21 1627 12/21/21 2356 12/22/21 0514  HGB 7.8*  --  7.7*  --   --  7.1*  HCT 22.7*  --  22.4*  --   --  21.4*  PLT 41*  --  32*  --   --  25*  HEPARINUNFRC  --    < >  --  0.11* 0.24* 0.54  CREATININE 0.87  --  0.95  --   --  1.05*   < > = values in this interval not displayed.     Estimated Creatinine Clearance: 64.7 mL/min (A) (by C-G formula based on SCr of 1.05 mg/dL (H)).   Medical History: Past Medical History:  Diagnosis Date   Ankle sprain    left   Anxiety    treated for panic attacks in the past.   Asthma    COPD (chronic obstructive pulmonary disease) ([place])    Depression    History of radiation therapy    Right lung- 09/26/21-11/13/21- Dr. Gery Pray   Irregular heart rate    Lung cancer [facility])    Renal disorder    kidney infections    Medications:  Infusions:   sodium chloride Stopped (12/19/21 1435)   ceFEPime (MAXIPIME) IV Stopped (12/22/21 0758)   famotidine (PEPCID) IV Stopped (12/21/21 1300)   heparin 1,650 Units/hr (12/22/21 0924)   promethazine (PHENERGAN) injection (IM or IVPB) Stopped (12/21/21 1155)    Assessment: 22 yoF admtted on 1/20 with N/V, respiratory failure.  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  Pharmacy is now consulted to dose Heparin  for acute upper extremity DVT in the setting of PICC line.     No recent anticoagulation.  Granix given 1/21- 1/25.   CBC:  Hgb remains low at 7.8, Plt decreased to 41k  Heparin drip was stopped to discuss with oncology about continuing care with heparin in light of platelets. Decision made to continue with modified target based on platelets.   Oncology recommendation now is:  Target Goals ( plts) : hold for <25, aim for 0.3-0.5 less than 50, and standard dose above 50. No bolus at any point  Goal of Therapy:  Heparin level 0.3-0.5 units/ml Monitor platelets by anticoagulation protocol: Yes  Today, 12/22/21  Resume heparin with target goal of 0.3 to 0.5 as plt are 25. Messaged Dr. Florene Glen. Ok to continue at above target. Planning to also transfuse the pt.   Hgb 7.1, low but stable  SCr 1.05, CrCl 65  ml/min  Most recent heparin level is 0.54 remains supratherapeutic with modified target on 1750 units/hr Per RN no bleeding  Plan:  Platelets < 25k: hold heparin Platelets 25k - 50k: target HL 0.3-0.5 units/mL Platelets > 50k: target HL 0.3-0.7 units/mL NO bolus at any point  Decrease heparin to 1650 units/hr Check anti-Xa level in 6 hours and daily while on heparin.  Note low goal range 0.3-0.5 units/ml, currently   Check daily CBC - continue to  monitor H&H and platelets    Royetta Asal, PharmD, BCPS 12/22/2021 10:07 AM

## 2021-12-23 DIAGNOSIS — J9601 Acute respiratory failure with hypoxia: Secondary | ICD-10-CM | POA: Diagnosis not present

## 2021-12-23 LAB — CBC WITH DIFFERENTIAL/PLATELET
Abs Immature Granulocytes: 0.3 10*3/uL — ABNORMAL HIGH (ref 0.00–0.07)
Abs Immature Granulocytes: 1.9 10*3/uL — ABNORMAL HIGH (ref 0.00–0.07)
Basophils Absolute: 0 10*3/uL (ref 0.0–0.1)
Basophils Absolute: 0 10*3/uL (ref 0.0–0.1)
Basophils Relative: 0 %
Basophils Relative: 0 %
Eosinophils Absolute: 0.4 10*3/uL (ref 0.0–0.5)
Eosinophils Absolute: 0 10*3/uL (ref 0.0–0.5)
Eosinophils Relative: 7 %
Eosinophils Relative: 0 %
HCT: 25.5 % — ABNORMAL LOW (ref 36.0–46.0)
HCT: 28.5 % — ABNORMAL LOW (ref 36.0–46.0)
Hemoglobin: 8.4 g/dL — ABNORMAL LOW (ref 12.0–15.0)
Hemoglobin: 9.6 g/dL — ABNORMAL LOW (ref 12.0–15.0)
Immature Granulocytes: 26 %
Lymphocytes Relative: 0 %
Lymphocytes Relative: 9 %
Lymphs Abs: 0 10*3/uL — ABNORMAL LOW (ref 0.7–4.0)
Lymphs Abs: 0.6 10*3/uL — ABNORMAL LOW (ref 0.7–4.0)
MCH: 31.9 pg (ref 26.0–34.0)
MCH: 31.8 pg (ref 26.0–34.0)
MCHC: 32.9 g/dL (ref 30.0–36.0)
MCHC: 33.7 g/dL (ref 30.0–36.0)
MCV: 97 fL (ref 80.0–100.0)
MCV: 94.4 fL (ref 80.0–100.0)
Metamyelocytes Relative: 1 %
Monocytes Absolute: 0.4 10*3/uL (ref 0.1–1.0)
Monocytes Absolute: 1.4 10*3/uL — ABNORMAL HIGH (ref 0.1–1.0)
Monocytes Relative: 8 %
Monocytes Relative: 19 %
Myelocytes: 5 %
Neutro Abs: 4 10*3/uL (ref 1.7–7.7)
Neutro Abs: 3.4 10*3/uL (ref 1.7–7.7)
Neutrophils Relative %: 79 %
Neutrophils Relative %: 46 %
Platelets: 39 10*3/uL — ABNORMAL LOW (ref 150–400)
Platelets: 54 10*3/uL — ABNORMAL LOW (ref 150–400)
RBC: 2.63 MIL/uL — ABNORMAL LOW (ref 3.87–5.11)
RBC: 3.02 MIL/uL — ABNORMAL LOW (ref 3.87–5.11)
RDW: 15 % (ref 11.5–15.5)
RDW: 15 % (ref 11.5–15.5)
WBC: 5 10*3/uL (ref 4.0–10.5)
WBC: 7.3 10*3/uL (ref 4.0–10.5)
nRBC: 0.8 % — ABNORMAL HIGH (ref 0.0–0.2)
nRBC: 0.7 % — ABNORMAL HIGH (ref 0.0–0.2)

## 2021-12-23 LAB — PHOSPHORUS: Phosphorus: 3.2 mg/dL (ref 2.5–4.6)

## 2021-12-23 LAB — COMPREHENSIVE METABOLIC PANEL
ALT: 19 U/L (ref 0–44)
AST: 11 U/L — ABNORMAL LOW (ref 15–41)
Albumin: <1.5 g/dL — ABNORMAL LOW (ref 3.5–5.0)
Alkaline Phosphatase: 61 U/L (ref 38–126)
Anion gap: 8 (ref 5–15)
BUN: 29 mg/dL — ABNORMAL HIGH (ref 6–20)
CO2: 24 mmol/L (ref 22–32)
Calcium: 8.5 mg/dL — ABNORMAL LOW (ref 8.9–10.3)
Chloride: 101 mmol/L (ref 98–111)
Creatinine, Ser: 0.9 mg/dL (ref 0.44–1.00)
GFR, Estimated: >60 mL/min (ref >60)
Glucose, Bld: 123 mg/dL — ABNORMAL HIGH (ref 70–99)
Potassium: 4.1 mmol/L (ref 3.5–5.1)
Sodium: 133 mmol/L — ABNORMAL LOW (ref 135–145)
Total Bilirubin: 0.5 mg/dL (ref 0.3–1.2)
Total Protein: 5.8 g/dL — ABNORMAL LOW (ref 6.5–8.1)

## 2021-12-23 LAB — MAGNESIUM: Magnesium: 1.6 mg/dL — ABNORMAL LOW (ref 1.7–2.4)

## 2021-12-23 LAB — HEPARIN LEVEL (UNFRACTIONATED)
Heparin Unfractionated: 0.42 IU/mL (ref 0.30–0.70)
Heparin Unfractionated: 0.5 IU/mL (ref 0.30–0.70)

## 2021-12-23 MED ORDER — PREDNISONE 10 MG PO TABS
10.0000 mg | ORAL_TABLET | Freq: Every day | ORAL | Status: DC
Start: 2022-01-05 — End: 2021-12-24

## 2021-12-23 MED ORDER — FUROSEMIDE 10 MG/ML IJ SOLN
40.0000 mg | Freq: Once | INTRAMUSCULAR | Status: AC
  Administered 2021-12-23: 40 mg via INTRAVENOUS
  Filled 2021-12-23: qty 4

## 2021-12-23 MED ORDER — PREDNISONE 20 MG PO TABS: 40.0000 mg | ORAL_TABLET | Freq: Every day | ORAL | Status: DC

## 2021-12-23 MED ORDER — MAGNESIUM SULFATE 2 GM/50ML IV SOLN
2.0000 g | Freq: Once | INTRAVENOUS | Status: AC
  Administered 2021-12-23: 2 g via INTRAVENOUS
  Filled 2021-12-23: qty 50

## 2021-12-23 MED ORDER — PREDNISONE 20 MG PO TABS: 20.0000 mg | ORAL_TABLET | Freq: Every day | ORAL | Status: DC

## 2021-12-23 MED ORDER — PREDNISONE 20 MG PO TABS
50.0000 mg | ORAL_TABLET | Freq: Every day | ORAL | Status: DC
  Filled 2021-12-23: qty 1

## 2021-12-23 MED ORDER — PREDNISONE 20 MG PO TABS: 30.0000 mg | ORAL_TABLET | Freq: Every day | ORAL | Status: DC

## 2021-12-23 NOTE — Progress Notes (Signed)
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  Currently on chemotherapy, last infusion on 12/06/2021.  Her nausea and vomiting has progressed and she has been unable to keep her medications down.  Patient denied fever, no chills, no headache, no diarrhea..   In the ED, temperature 98.4 F, HR 145, RR 18, BP 104/30, SPO2 100% on room air.  Sodium 133, potassium 3.7, chloride 102, CO2 16, glucose 145, BUN 22, creatinine 1.64, AST 17, ALT 13, total bilirubin 0.9.  WBC 0.3, hemoglobin 7.0, platelets 6.  High sensitivity troponin 3, <2.  Lactic acid 2.9.  Chest x-ray with no active cardiopulmonary disease process, similar small right suprahilar opacity.  CT angiogram chest with no evidence of pulmonary embolism, improving mediastinal/right hilar mass/adenopathy.  Oncology was consulted.  TRH consulted for further evaluation and management of intractable nausea/vomiting, pancytopenia.    Assessment & Plan:   Principal Problem:   Acute respiratory failure (HCC) Active Problems:   Pneumonia   Aspiration pneumonia (HCC)   Neutropenic fever (HCC)   Chemotherapy induced nausea and vomiting   Radiation-induced esophagitis   Pancytopenia (HCC)   Sinus tachycardia   Hypomagnesemia   Hypokalemia   Hypophosphatemia   Electrolyte abnormality   DVT of upper extremity (deep vein thrombosis) (HCC)   Hyponatremia   AKI (acute kidney  injury) ([place])   Malignant neoplasm of right upper lobe of lung (HCC)   Cancer related pain   Tobacco abuse   Goals of care, counseling/discussion   Dehydration   Chest pain   Hyperglycemia   Leukopenia due to antineoplastic chemotherapy (HCC)   Normocytic anemia   Generalized weakness   * Acute respiratory failure (HCC) Currently on 2 L [place]  CT 1/26 with circumferential wall thickening of RUL bronchus with intraluminal debris and subsequent bronchial narrowing, RLL bronchial thickening with intraluminal debris and adjacent peribronchovascular opacities in RLL - infectious vs inflammatory Seen by pulm, concern for element of vocal cord dysfunction or radiation induced upper airway inflammation Aggressive reflux treatment Reflux precautions bipap prn Lasix as tolerated per pulm  Will trial steroids - taper gradually Continue Cefepime Seems to be improving with lasix/steroids/abx - continue    Aspiration pneumonia (HCC) Continue cefepime, follow   Neutropenic fever ([place]) Last true fever 1/23 granix per oncology Neutropenia resolved   Radiation-induced esophagitis PPI H2 blocker sucralfate  relfux precautions  Chemotherapy induced nausea and vomiting Continue antiemetics  Sinus tachycardia 2/2 above Started on low dose scheduled metop on 1/24 eICU scheduled PO metop 1/26 overnight Echo with EF 60-65%, RVSF normal (see report) Suspect driven by respiratory issues above Repeat CT to r/o PE given RUE DVT, negative for PE   Pancytopenia ([place])- (present on admission) Related to chemo Fluctuating and persistent thrombocytopenia and anemia WBC improved Improved after transfusion - follow Will continue to transfuse as needed  DVT of upper extremity (deep vein thrombosis) ([place]) Related to PICC Discussed with oncology given thrombocytopenia -> recommending holding anticoagulation for platelets <25, plan to  aim for lower range of therapeutic for heparin when platelets  25-50, and standard dose anticoagulation for >50 Will leave picc for now      Electrolyte abnormality Replace potassium, magnesium, phosphorus as needed  AKI (acute kidney injury) ([place])- (present on admission) resolved  Hyponatremia- (present on admission) resolved  Cancer related pain Oxycontin, oxycodone, dilaudid for breakthrough  Malignant neoplasm of right upper lobe of lung ([place])- (present on admission) Small Cell Lung Cancer Follows with Dr. Julien Nordmann outpatient Last chemo 12/06/2021  Tobacco abuse Encouraged cessation  Goals of care, counseling/discussion Per prior notes, patient contemplating stopping chemo Appreciate palliative care assistance (1/27 note)   DVT prophylaxis: SCD Code Status: full Family Communication: mother Disposition:   Status is: Inpatient  Remains inpatient appropriate because: need for further inpatient care, goc discussions, IV steroids, IV abx       Consultants:  PCCM Oncology Palliative care  Procedures:  none  Antimicrobials:  Anti-infectives (From admission, onward)    Start     Dose/Rate Route Frequency Ordered Stop   12/18/21 0800  vancomycin (VANCOREADY) IVPB 1000 mg/200 mL  Status:  Discontinued        1,000 mg 200 mL/hr over 60 Minutes Intravenous Every 12 hours 12/17/21 1859 12/19/21 0912   12/17/21 2200  ceFEPIme (MAXIPIME) 2 g in sodium chloride 0.9 % 100 mL IVPB  Status:  Discontinued        2 g 200 mL/hr over 30 Minutes Intravenous Every 8 hours 12/17/21 1750 12/17/21 1756   12/17/21 1915  vancomycin (VANCOREADY) IVPB 1500 mg/300 mL        1,500 mg 150 mL/hr over 120 Minutes Intravenous  Once 12/17/21 1800 12/17/21 2057   12/17/21 1845  ceFEPIme (MAXIPIME) 2 g in sodium chloride 0.9 % 100 mL IVPB        2 g 200 mL/hr over 30 Minutes Intravenous Every 8 hours 12/17/21 1756 12/24/21 2159       Subjective: No new complaints  Objective: Vitals:   12/23/21 0630 12/23/21 0640 12/23/21 0746 12/23/21  1104  BP:    137/81  Pulse: 89 89    Resp: 16 16    Temp:   (!) 97.3 F (36.3 C)   TempSrc:   Oral   SpO2: 99% 98%    Weight:      Height:        Intake/Output Summary (Last 24 hours) at 12/23/2021 1204 Last data filed at 12/23/2021 0859 Gross per 24 hour  Intake 1429.6 ml  Output 3800 ml  Net -2370.4 ml   Filed Weights   12/15/21 0117  Weight: 68.8 kg    Examination:  General: No acute distress. Cardiovascular: RRR Lungs: unlabored Abdomen: Soft, nontender, nondistended Neurological: Alert and oriented 3. Moves all extremities 4. Cranial nerves II through XII grossly intact. Skin: Warm and dry. No rashes or lesions. Extremities: R arm swelling, palpable radial pulse   Data Reviewed: I have personally reviewed following labs and imaging studies  CBC: Recent Labs  Lab 12/19/21 0535 12/19/21 1548 12/20/21 0435 12/21/21 0503 12/22/21 0514 12/22/21 1948 12/23/21 0449  WBC 1.0*  --  3.2* 6.2 5.6 8.8 5.0  NEUTROABS 0.4*  --  1.4* 4.5 3.0  --  4.0  HGB 7.3*   < > 7.8* 7.7* 7.1* 9.0* 8.4*  HCT 21.4*   < > 22.7* 22.4* 21.4* 27.1* 25.5*  MCV 95.1  --  95.0 94.5 96.4 94.1 97.0  PLT 48*  --  41* 32* 25* 54*  39*   < > = values in this interval not displayed.    Basic Metabolic Panel: Recent Labs  Lab 12/19/21 0535 12/20/21 0435 12/21/21 0503 12/22/21 0513 12/22/21 0514 12/23/21 0449  NA 136 134* 135  --  132* 133*  K 3.2* 3.8 3.3*  --  4.0 4.1  CL 102 102 100  --  100 101  CO2 26 25 25   --  25 24  GLUCOSE 114* 131* 126*  --  127* 123*  BUN 14 20 25*  --  28* 29*  CREATININE 0.86 0.87 0.95  --  1.05* 0.90  CALCIUM 8.5* 9.1 8.7*  --  8.9 8.5*  MG 1.7 1.4* 1.9 1.5*  --  1.6*  PHOS 2.8 2.0* 2.9 2.9  --  3.2    GFR: Estimated Creatinine Clearance: 75.4 mL/min (by C-G formula based on SCr of 0.9 mg/dL).  Liver Function Tests: Recent Labs  Lab 12/17/21 1701 12/20/21 0435 12/21/21 0503 12/22/21 0514 12/23/21 0449  AST 11* 20 23 13* 11*  ALT 12 27 32  25 19  ALKPHOS 59 68 77 63 61  BILITOT 1.0 0.9 0.8 0.9 0.5  PROT 6.7 6.4* 6.4* 5.7* 5.8*  ALBUMIN 2.8* 2.7* 2.7* 2.5* <1.5*    CBG: No results for input(s): GLUCAP in the last 168 hours.   Recent Results (from the past 240 hour(s))  Culture, blood (routine x 2)     Status: None   Collection Time: 12/14/21  4:26 PM   Specimen: BLOOD RIGHT HAND  Result Value Ref Range Status   Specimen Description   Final    BLOOD RIGHT HAND Performed at [facility] [address], [place], [place] [address]    Special Requests   Final    BOTTLES DRAWN AEROBIC AND ANAEROBIC Blood Culture results may not be optimal due to an inadequate volume of blood received in culture bottles Performed at [facility] [address] [place], [place] [address]    Culture   Final    NO GROWTH 5 DAYS Performed at [facility], [place] [address] [place], [place] [address]    Report Status 12/19/2021 FINAL  Final  Culture, blood (routine x 2)     Status: None   Collection Time: 12/14/21  4:31 PM   Specimen: Left Antecubital; Blood  Result Value Ref Range Status   Specimen Description   Final    LEFT ANTECUBITAL Performed at [facility] [address] [place], [place] [address]    Special Requests   Final    BOTTLES DRAWN AEROBIC AND ANAEROBIC Blood Culture adequate volume Performed at [facility] [address], [place], [place] [address]    Culture   Final    NO GROWTH 5 DAYS Performed at [facility], [place] [address], [place], [place] [address]    Report Status 12/19/2021 FINAL  Final  Resp Panel by RT-PCR (Flu Cyndi Montejano&B, Covid) Nasopharyngeal Swab     Status: None   Collection Time: 12/14/21  4:38 PM   Specimen: Nasopharyngeal Swab; Nasopharyngeal(NP) swabs in vial transport medium  Result Value Ref Range Status   SARS Coronavirus 2 by RT PCR NEGATIVE NEGATIVE Final    Comment: (NOTE) SARS-CoV-2 target nucleic  acids are NOT DETECTED.  The SARS-CoV-2 RNA is generally detectable in upper respiratory specimens during the acute phase of infection. The lowest concentration of SARS-CoV-2 viral copies this assay can detect is 138 copies/mL. Shaylen Nephew  negative result does not preclude SARS-Cov-2 infection and should not be used as the sole basis for treatment or other patient management decisions. Rehmat Murtagh negative result may occur with  improper specimen collection/handling, submission of specimen other than nasopharyngeal swab, presence of viral mutation(s) within the areas targeted by this assay, and inadequate number of viral copies(<138 copies/mL). Annise Boran negative result must be combined with clinical observations, patient history, and epidemiological information. The expected result is Negative.  Fact Sheet for Patients:  EntrepreneurPulse.com.au  Fact Sheet for Healthcare Providers:  IncredibleEmployment.be  This test is no t yet approved or cleared by the [place] FDA and  has been authorized for detection and/or diagnosis of SARS-CoV-2 by FDA under an Emergency Use Authorization (EUA). This EUA will remain  in effect (meaning this test can be used) for the duration of the COVID-19 declaration under Section 564(b)(1) of the Act, 21 U.S.C.section 360bbb-3(b)(1), unless the authorization is terminated  or revoked sooner.       Influenza Pheng Prokop by PCR NEGATIVE NEGATIVE Final   Influenza B by PCR NEGATIVE NEGATIVE Final    Comment: (NOTE) The Xpert Xpress SARS-CoV-2/FLU/RSV plus assay is intended as an aid in the diagnosis of influenza from Nasopharyngeal swab specimens and should not be used as Lynora Dymond sole basis for treatment. Nasal washings and aspirates are unacceptable for Xpert Xpress SARS-CoV-2/FLU/RSV testing.  Fact Sheet for Patients: EntrepreneurPulse.com.au  Fact Sheet for Healthcare Providers: IncredibleEmployment.be  This  test is not yet approved or cleared by the [place] FDA and has been authorized for detection and/or diagnosis of SARS-CoV-2 by FDA under an Emergency Use Authorization (EUA). This EUA will remain in effect (meaning this test can be used) for the duration of the COVID-19 declaration under Section 564(b)(1) of the Act, 21 U.S.C. section 360bbb-3(b)(1), unless the authorization is terminated or revoked.  Performed at [facility], [place] [address], [place], [place] [address]   Culture, blood (routine x 2)     Status: None   Collection Time: 12/17/21  6:32 PM   Specimen: BLOOD LEFT HAND  Result Value Ref Range Status   Specimen Description   Final    BLOOD LEFT HAND Performed at [facility] [address] [place], [place] [address]    Special Requests   Final    BOTTLES DRAWN AEROBIC ONLY Blood Culture results may not be optimal due to an inadequate volume of blood received in culture bottles Performed at [facility] [address], [place], [place] [address]    Culture   Final    NO GROWTH 5 DAYS Performed at [facility], [place] [address] [place], [place] [address]    Report Status 12/22/2021 FINAL  Final  Culture, blood (routine x 2)     Status: None   Collection Time: 12/17/21  6:32 PM   Specimen: BLOOD LEFT FOREARM  Result Value Ref Range Status   Specimen Description   Final    BLOOD LEFT FOREARM Performed at [facility] [address], [place], [place] [address]    Special Requests   Final    BOTTLES DRAWN AEROBIC ONLY Blood Culture results may not be optimal due to an inadequate volume of blood received in culture bottles Performed at [facility] [address] [place], [place] [address]    Culture   Final    NO GROWTH 5 DAYS Performed at [facility], [place] [address] [place], [place]  [address]    Report Status 12/22/2021 FINAL  Final  Urine Culture      Status: Abnormal   Collection Time: 12/18/21  3:00 AM   Specimen: Urine, Clean Catch  Result Value Ref Range Status   Specimen Description   Final    URINE, CLEAN CATCH Performed at [facility], [place] [address] [place], [place] [address]    Special Requests   Final    Immunocompromised Performed at [facility], [place] [address], [place], [place] [address]    Culture (Alic Hilburn)  Final    <10,000 COLONIES/mL INSIGNIFICANT GROWTH Performed at [facility] [address], [place], [place] [address]    Report Status 12/19/2021 FINAL  Final  MRSA Next Gen by PCR, Nasal     Status: None   Collection Time: 12/19/21  5:40 AM   Specimen: Nasal Mucosa; Nasal Swab  Result Value Ref Range Status   MRSA by PCR Next Gen NOT DETECTED NOT DETECTED Final    Comment: (NOTE) The GeneXpert MRSA Assay (FDA approved for NASAL specimens only), is one component of Antionetta Ator comprehensive MRSA colonization surveillance program. It is not intended to diagnose MRSA infection nor to guide or monitor treatment for MRSA infections. Test performance is not FDA approved in patients less than 43 years old. Performed at [facility], [place] [address], [place],  [address]          Radiology Studies: No results found.      Scheduled Meds:  sodium chloride   Intravenous Once   acetaminophen (TYLENOL) oral liquid 160 mg/5 mL  1,000 mg Oral Q8H   chlorhexidine  15 mL Mouth Rinse BID   Chlorhexidine Gluconate Cloth  6 each Topical Daily   feeding supplement  237 mL Oral BID BM   furosemide  40 mg Intravenous Once   mouth rinse  15 mL Mouth Rinse q12n4p   metoprolol tartrate  12.5 mg Oral BID   multivitamin with minerals  1 tablet Oral Daily   nicotine  21 mg Transdermal Daily   oxyCODONE  20 mg Oral Q12H   pantoprazole (PROTONIX) IV  40 mg Intravenous Q12H   [START ON 12/24/2021] predniSONE  50 mg Oral Q breakfast   Followed by   Derrill Memo ON  12/27/2021] predniSONE  40 mg Oral Q breakfast   Followed by   Derrill Memo ON 12/30/2021] predniSONE  30 mg Oral Q breakfast   Followed by   Derrill Memo ON 01/02/2022] predniSONE  20 mg Oral Q breakfast   Followed by   Derrill Memo ON 01/05/2022] predniSONE  10 mg Oral Q breakfast   sodium chloride flush  10-40 mL Intracatheter Q12H   sucralfate  1 g Oral TID WC & HS   Continuous Infusions:  sodium chloride Stopped (12/19/21 1435)   ceFEPime (MAXIPIME) IV Stopped (12/23/21 0636)   famotidine (PEPCID) IV Stopped (12/22/21 1238)   heparin 1,550 Units/hr (12/23/21 0859)   promethazine (PHENERGAN) injection (IM or IVPB) Stopped (12/21/21 1155)     LOS: 9 days    Time spent: over 39 min    Fayrene Helper, MD [facility]   To contact the attending provider between 7A-7P or the covering provider during after hours 7P-7A, please log into the web site [email or web address] and access using universal [place] password for that web site. If you do not have the password, please call the hospital operator.  12/23/2021, 12:04 PM

## 2021-12-23 NOTE — Progress Notes (Signed)
ANTICOAGULATION CONSULT NOTE - follow up  Pharmacy Consult for Heparin Indication: DVT  Allergies  Allergen Reactions   Naproxen Nausea Only   Bactrim [Sulfamethoxazole-Trimethoprim] Nausea Only    Patient Measurements: Height: 5\' 8"  (172.7 cm) Weight: 68.8 kg (151 lb 10.8 oz) IBW/kg (Calculated) : 63.9 Heparin Dosing Weight: TBW  Vital Signs: Temp: 97.5 F (36.4 C) (01/28 2300) Temp Source: Axillary (01/28 2300) BP: 147/86 (01/29 0205) Pulse Rate: 79 (01/29 0225)  Labs: Recent Labs    12/20/21 0435 12/21/21 0104 12/21/21 0503 12/21/21 1627 12/22/21 0514 12/22/21 1530 12/22/21 1948 12/23/21 0200  HGB 7.8*  --  7.7*  --  7.1*  --  9.0*  --   HCT 22.7*  --  22.4*  --  21.4*  --  27.1*  --   PLT 41*  --  32*  --  25*  --  54*  --   HEPARINUNFRC  --    < >  --    < > 0.54 0.53  --  0.42  CREATININE 0.87  --  0.95  --  1.05*  --   --   --    < > = values in this interval not displayed.     Estimated Creatinine Clearance: 64.7 mL/min (A) (by C-G formula based on SCr of 1.05 mg/dL (H)).   Medical History: Past Medical History:  Diagnosis Date   Ankle sprain    left   Anxiety    treated for panic attacks in the past.   Asthma    COPD (chronic obstructive pulmonary disease) ([place])    Depression    History of radiation therapy    Right lung- 09/26/21-11/13/21- Dr. Gery Pray   Irregular heart rate    Lung cancer [facility])    Renal disorder    kidney infections    Medications:  Infusions:   sodium chloride Stopped (12/19/21 1435)   ceFEPime (MAXIPIME) IV Stopped (12/22/21 2203)   famotidine (PEPCID) IV Stopped (12/22/21 1238)   heparin 1,550 Units/hr (12/23/21 0000)   promethazine (PHENERGAN) injection (IM or IVPB) Stopped (12/21/21 1155)    Assessment: 41 yoF admtted on 1/20 with N/V, respiratory failure.  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  Pharmacy is now consulted to dose Heparin  for acute upper extremity DVT in the  setting of PICC line.     No recent anticoagulation.  Granix given 1/21- 1/25.  CBC:  Hgb remains low at 7.8, Plt decreased to 41k  Heparin drip was stopped to discuss with oncology about continuing care with heparin in light of platelets.   Oncology recommendation now is:  Target Goals ( plts) : hold for <25, aim for 0.3-0.5 less than 50, and standard dose above 50. No bolus at any point  Goal of Therapy:  Heparin level 0.3-0.5 units/ml Monitor platelets by anticoagulation protocol: Yes  Today, 12/23/21  HL 0.42 therapeutic on 1550 units/hr Per RN no bleeding  Plan:  Platelets < 25k: hold heparin Platelets 25k - 50k: target HL 0.3-0.5 units/mL Platelets > 50k: target HL 0.3-0.7 units/mL NO bolus at any point  Continue heparin drip at 1550 units/hr Check anti-Xa level in 6 hours and daily while on heparin.  Note low goal range 0.3-0.5 units/ml, currently   Check daily CBC - continue to monitor H&H and platelets  Dolly Rias RPh 12/23/2021, 2:54 AM

## 2021-12-23 NOTE — Progress Notes (Signed)
ANTICOAGULATION CONSULT NOTE - follow up  Pharmacy Consult for Heparin Indication: DVT  Allergies  Allergen Reactions   Naproxen Nausea Only   Bactrim [Sulfamethoxazole-Trimethoprim] Nausea Only    Patient Measurements: Height: 5\' 8"  (172.7 cm) Weight: 68.8 kg (151 lb 10.8 oz) IBW/kg (Calculated) : 63.9 Heparin Dosing Weight: TBW  Vital Signs: Temp: 97.3 F (36.3 C) (01/29 0746) Temp Source: Oral (01/29 0746) BP: 125/63 (01/29 0500) Pulse Rate: 89 (01/29 0640)  Labs: Recent Labs    12/21/21 0503 12/21/21 1627 12/22/21 0514 12/22/21 1530 12/22/21 1948 12/23/21 0200 12/23/21 0449 12/23/21 0817  HGB 7.7*  --  7.1*  --  9.0*  --  8.4*  --   HCT 22.4*  --  21.4*  --  27.1*  --  25.5*  --   PLT 32*  --  25*  --  54*  --  39*  --   HEPARINUNFRC  --    < > 0.54 0.53  --  0.42  --  0.50  CREATININE 0.95  --  1.05*  --   --   --  0.90  --    < > = values in this interval not displayed.     Estimated Creatinine Clearance: 75.4 mL/min (by C-G formula based on SCr of 0.9 mg/dL).   Medical History: Past Medical History:  Diagnosis Date   Ankle sprain    left   Anxiety    treated for panic attacks in the past.   Asthma    COPD (chronic obstructive pulmonary disease) ([place])    Depression    History of radiation therapy    Right lung- 09/26/21-11/13/21- Dr. Gery Pray   Irregular heart rate    Lung cancer [facility])    Renal disorder    kidney infections    Medications:  Infusions:   sodium chloride Stopped (12/19/21 1435)   ceFEPime (MAXIPIME) IV Stopped (12/23/21 0636)   famotidine (PEPCID) IV Stopped (12/22/21 1238)   heparin 1,550 Units/hr (12/23/21 0859)   promethazine (PHENERGAN) injection (IM or IVPB) Stopped (12/21/21 1155)    Assessment: 76 yoF admtted on 1/20 with N/V, respiratory failure.  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  Pharmacy is now consulted to dose Heparin  for acute upper extremity DVT in the setting of  PICC line.     No recent anticoagulation.  Granix given 1/21- 1/25.  CBC:  Hgb remains low at 7.8, Plt decreased to 41k  Heparin drip was stopped to discuss with oncology about continuing care with heparin in light of platelets.   Oncology recommendation now is:  Target Goals ( plts) : hold for <25, aim for 0.3-0.5 less than 50, and standard dose above 50. No bolus at any point  Goal of Therapy:  Heparin level 0.3-0.5 units/ml Monitor platelets by anticoagulation protocol: Yes  Today, 12/23/21  HL 0.50 therapeutic on 1550 units/hr Hgb is 8.4, plt 39  Per RN no bleeding  Plan:  Platelets < 25k: hold heparin Platelets 25k - 50k: target HL 0.3-0.5 units/mL Platelets > 50k: target HL 0.3-0.7 units/mL NO bolus at any point  Continue heparin drip at 1550 units/hr Check anti-Xa level in 6 hours and daily while on heparin.  Note low goal range 0.3-0.5 units/ml, currently   Check daily CBC - continue to monitor H&H and platelets   Royetta Asal, PharmD, BCPS 12/23/2021 10:40 AM

## 2021-12-23 NOTE — Assessment & Plan Note (Deleted)
Replace and follow. ?

## 2021-12-24 ENCOUNTER — Inpatient Hospital Stay (HOSPITAL_COMMUNITY): Payer: Medicaid Other

## 2021-12-24 DIAGNOSIS — C3411 Malignant neoplasm of upper lobe, right bronchus or lung: Secondary | ICD-10-CM | POA: Diagnosis not present

## 2021-12-24 DIAGNOSIS — G4700 Insomnia, unspecified: Secondary | ICD-10-CM

## 2021-12-24 DIAGNOSIS — J690 Pneumonitis due to inhalation of food and vomit: Principal | ICD-10-CM

## 2021-12-24 DIAGNOSIS — Z7189 Other specified counseling: Secondary | ICD-10-CM | POA: Diagnosis not present

## 2021-12-24 DIAGNOSIS — J9601 Acute respiratory failure with hypoxia: Secondary | ICD-10-CM | POA: Diagnosis not present

## 2021-12-24 LAB — CBC WITH DIFFERENTIAL/PLATELET
Abs Immature Granulocytes: 0.9 10*3/uL — ABNORMAL HIGH (ref 0.00–0.07)
Basophils Absolute: 0 10*3/uL (ref 0.0–0.1)
Basophils Relative: 0 %
Eosinophils Absolute: 0 10*3/uL (ref 0.0–0.5)
Eosinophils Relative: 0 %
HCT: 28.3 % — ABNORMAL LOW (ref 36.0–46.0)
Hemoglobin: 9.2 g/dL — ABNORMAL LOW (ref 12.0–15.0)
Lymphocytes Relative: 24 %
Lymphs Abs: 1.7 10*3/uL (ref 0.7–4.0)
MCH: 31.7 pg (ref 26.0–34.0)
MCHC: 32.5 g/dL (ref 30.0–36.0)
MCV: 97.6 fL (ref 80.0–100.0)
Metamyelocytes Relative: 4 %
Monocytes Absolute: 0.3 10*3/uL (ref 0.1–1.0)
Monocytes Relative: 4 %
Myelocytes: 8 %
Neutro Abs: 4.3 10*3/uL (ref 1.7–7.7)
Neutrophils Relative %: 60 %
Platelets: 52 10*3/uL — ABNORMAL LOW (ref 150–400)
RBC: 2.9 MIL/uL — ABNORMAL LOW (ref 3.87–5.11)
RDW: 15.1 % (ref 11.5–15.5)
WBC Morphology: ABNORMAL
WBC: 7.1 10*3/uL (ref 4.0–10.5)
nRBC: 0.8 % — ABNORMAL HIGH (ref 0.0–0.2)

## 2021-12-24 LAB — MAGNESIUM: Magnesium: 1.9 mg/dL (ref 1.7–2.4)

## 2021-12-24 LAB — BPAM RBC
Blood Product Expiration Date: 202302222359
ISSUE DATE / TIME: 202301281453
Unit Type and Rh: 7300

## 2021-12-24 LAB — COMPREHENSIVE METABOLIC PANEL
ALT: 17 U/L (ref 0–44)
AST: 10 U/L — ABNORMAL LOW (ref 15–41)
Albumin: 2.8 g/dL — ABNORMAL LOW (ref 3.5–5.0)
Alkaline Phosphatase: 60 U/L (ref 38–126)
Anion gap: 9 (ref 5–15)
BUN: 27 mg/dL — ABNORMAL HIGH (ref 6–20)
CO2: 24 mmol/L (ref 22–32)
Calcium: 9 mg/dL (ref 8.9–10.3)
Chloride: 100 mmol/L (ref 98–111)
Creatinine, Ser: 0.92 mg/dL (ref 0.44–1.00)
GFR, Estimated: >60 mL/min (ref >60)
Glucose, Bld: 86 mg/dL (ref 70–99)
Potassium: 3.8 mmol/L (ref 3.5–5.1)
Sodium: 133 mmol/L — ABNORMAL LOW (ref 135–145)
Total Bilirubin: 0.7 mg/dL (ref 0.3–1.2)
Total Protein: 6.1 g/dL — ABNORMAL LOW (ref 6.5–8.1)

## 2021-12-24 LAB — PREPARE PLATELET PHERESIS
Unit division: 0
Unit division: 0

## 2021-12-24 LAB — BPAM PLATELET PHERESIS
Blood Product Expiration Date: 202301282359
Blood Product Expiration Date: 202301292359
ISSUE DATE / TIME: 202301281026
ISSUE DATE / TIME: 202301281304
Unit Type and Rh: 600
Unit Type and Rh: 9500

## 2021-12-24 LAB — TYPE AND SCREEN
ABO/RH(D): B POS
Antibody Screen: NEGATIVE
Unit division: 0

## 2021-12-24 LAB — PHOSPHORUS: Phosphorus: 2.3 mg/dL — ABNORMAL LOW (ref 2.5–4.6)

## 2021-12-24 LAB — HEPARIN LEVEL (UNFRACTIONATED): Heparin Unfractionated: 0.31 IU/mL (ref 0.30–0.70)

## 2021-12-24 MED ORDER — TRAZODONE HCL 50 MG PO TABS
50.0000 mg | ORAL_TABLET | Freq: Every day | ORAL | Status: DC
  Administered 2021-12-25 – 2021-12-26 (×2): 50 mg via ORAL
  Filled 2021-12-24 (×2): qty 1

## 2021-12-24 MED ORDER — HEPARIN (PORCINE) 25000 UT/250ML-% IV SOLN
1550.0000 [IU]/h | INTRAVENOUS | Status: DC
  Administered 2021-12-24 – 2021-12-25 (×2): 1550 [IU]/h via INTRAVENOUS
  Filled 2021-12-24: qty 250

## 2021-12-24 MED ORDER — METHYLPREDNISOLONE SODIUM SUCC 40 MG IJ SOLR
40.0000 mg | Freq: Every day | INTRAMUSCULAR | Status: DC
  Administered 2021-12-25 – 2021-12-26 (×2): 40 mg via INTRAVENOUS
  Filled 2021-12-24 (×2): qty 1

## 2021-12-24 MED ORDER — HYDROMORPHONE HCL 1 MG/ML IJ SOLN
0.5000 mg | INTRAMUSCULAR | Status: DC | PRN
  Administered 2021-12-24 – 2021-12-26 (×15): 1 mg via INTRAVENOUS
  Filled 2021-12-24 (×16): qty 1

## 2021-12-24 MED ORDER — FUROSEMIDE 10 MG/ML IJ SOLN
40.0000 mg | Freq: Once | INTRAMUSCULAR | Status: AC
  Administered 2021-12-24: 40 mg via INTRAVENOUS
  Filled 2021-12-24: qty 4

## 2021-12-24 MED ORDER — HYDROMORPHONE HCL 1 MG/ML IJ SOLN
0.5000 mg | INTRAMUSCULAR | Status: DC | PRN
  Administered 2021-12-24 (×3): 1 mg via INTRAVENOUS
  Filled 2021-12-24 (×3): qty 1

## 2021-12-24 MED ORDER — HEPARIN (PORCINE) 25000 UT/250ML-% IV SOLN: 1550.0000 [IU]/h | INTRAVENOUS | Status: DC

## 2021-12-24 NOTE — Consult Note (Signed)
Referring Provider: [facility] Primary Care Physician:  Etter Sjogren Primary Gastroenterologist:  Althia Forts  Reason for Consultation:  Dysphagia  HPI: [name] is a 51 y.o. female medical history significant for small cell lung cancer, COPD, tobacco abuse presents for evaluation of dysphagia.  Patient states she has been having intermittent dysphagia since October 2022. States it does not occur all the time and may go a week or two without difficulty. Can happen with both solids and liquids, though mainly thicker solids. Last radiation treatment was one month ago, last chem treatment was one week ago. Patient states over the weekend she ate a pot roast and it "got stuck" and since then she has been struggling to even swallow water. She states the food gets stuck in her upper esophagus (points to her suprasternal notch). Denies reflux. Denies pain. Denies nausea/vomiting. Denies melena/hematochezia. States her father has "stomach problems." States she has never had GI issues prior to this. Reports history of colonoscopy at age 74 for abdominal pain that turned out to be an ovarian issue. Patient has used tobacco daily for 40 years, sometimes reaching 3 packs per day. Has not consumed alcohol since cancer diagnosis. Denies NSAID use. Denies blood thinner use.   Past Medical History:  Diagnosis Date   Ankle sprain    left   Anxiety    treated for panic attacks in the past.   Asthma    COPD (chronic obstructive pulmonary disease) (HCC)    Depression    History of radiation therapy    Right lung- 09/26/21-11/13/21- Dr. Gery Pray   Irregular heart rate    Lung cancer [facility])    Renal disorder    kidney infections    Past Surgical History:  Procedure Laterality Date   APPENDECTOMY     BRONCHIAL BIOPSY  09/21/2021   Procedure: BRONCHIAL BIOPSIES;  Surgeon: Collene Gobble, MD;  Location: [place];  Service: Pulmonary;;   BRONCHIAL BRUSHINGS  09/21/2021   Procedure:  BRONCHIAL BRUSHINGS;  Surgeon: Collene Gobble, MD;  Location: [place];  Service: Pulmonary;;   BRONCHIAL NEEDLE ASPIRATION BIOPSY  09/21/2021   Procedure: BRONCHIAL NEEDLE ASPIRATION BIOPSIES;  Surgeon: Collene Gobble, MD;  Location: [facility];  Service: Pulmonary;;   LUMBAR LAMINECTOMY N/A 05/13/2018   Procedure: LEFT LUMBAR FOUR-FIVE MICRODISCECTOMY;  Surgeon: Marybelle Killings, MD;  Location: [place];  Service: Orthopedics;  Laterality: N/A;   TUBAL LIGATION     TUBAL LIGATION  1998   VIDEO BRONCHOSCOPY WITH ENDOBRONCHIAL ULTRASOUND N/A 09/21/2021   Procedure: VIDEO BRONCHOSCOPY WITH ENDOBRONCHIAL ULTRASOUND;  Surgeon: Collene Gobble, MD;  Location: [place] [facility];  Service: Pulmonary;  Laterality: N/A;    Prior to Admission medications   Medication Sig Start Date End Date Taking? Authorizing Provider  albuterol (PROVENTIL HFA;VENTOLIN HFA) 108 (90 Base) MCG/ACT inhaler Inhale 2 puffs into the lungs every 6 (six) hours as needed for wheezing or shortness of breath. 03/13/17  Yes Hairston, Mandesia R, FNP  diphenhydramine-acetaminophen (TYLENOL PM) 25-500 MG TABS tablet Take 3 tablets by mouth at bedtime as needed (sleep).   Yes [provider]  magnesium oxide (MAG-OX) 400 (240 Mg) MG tablet Take 1 tablet (400 mg total) by mouth 3 (three) times daily. 10/30/21  Yes Curt Bears, MD  ondansetron (ZOFRAN ODT) 8 MG disintegrating tablet Dissolve 1 tablet (8 mg total) by mouth every 8 (eight) hours as needed for nausea or vomiting. 10/11/21  Yes Heilingoetter, Cassandra L, PA-C  oxyCODONE ER (XTAMPZA  ER) 18 MG C12A Take 1 capsule by mouth 2 times daily. 11/06/21  Yes Gery Pray, MD  oxyCODONE-acetaminophen (PERCOCET/ROXICET) 5-325 MG tablet Take 1 tablet by mouth every 8 hours as needed for severe pain. 12/04/21  Yes Heilingoetter, Cassandra L, PA-C  potassium chloride 20 MEQ/15ML (10%) SOLN Take 15 mLs by mouth 2 times daily. 12/04/21  Yes Heilingoetter, Cassandra L, PA-C     Scheduled Meds:  sodium chloride   Intravenous Once   chlorhexidine  15 mL Mouth Rinse BID   Chlorhexidine Gluconate Cloth  6 each Topical Daily   feeding supplement  237 mL Oral BID BM   mouth rinse  15 mL Mouth Rinse q12n4p   metoprolol tartrate  12.5 mg Oral BID   multivitamin with minerals  1 tablet Oral Daily   nicotine  21 mg Transdermal Daily   oxyCODONE  20 mg Oral Q12H   pantoprazole (PROTONIX) IV  40 mg Intravenous Q12H   predniSONE  50 mg Oral Q breakfast   Followed by   Derrill Memo ON 12/27/2021] predniSONE  40 mg Oral Q breakfast   Followed by   Derrill Memo ON 12/30/2021] predniSONE  30 mg Oral Q breakfast   Followed by   Derrill Memo ON 01/02/2022] predniSONE  20 mg Oral Q breakfast   Followed by   Derrill Memo ON 01/05/2022] predniSONE  10 mg Oral Q breakfast   sodium chloride flush  10-40 mL Intracatheter Q12H   sucralfate  1 g Oral TID WC & HS   traZODone  50 mg Oral QHS   Continuous Infusions:  sodium chloride Stopped (12/19/21 1435)   ceFEPime (MAXIPIME) IV Stopped (12/24/21 6759)   famotidine (PEPCID) IV Stopped (12/23/21 1247)   heparin 1,550 Units/hr (12/24/21 1045)   promethazine (PHENERGAN) injection (IM or IVPB) Stopped (12/21/21 1155)   PRN Meds:.sodium chloride, [COMPLETED] acetaminophen (TYLENOL) oral liquid 160 mg/5 mL **FOLLOWED BY** acetaminophen (TYLENOL) oral liquid 160 mg/5 mL, chlorpheniramine-HYDROcodone, HYDROmorphone (DILAUDID) injection, levalbuterol, metoprolol tartrate, ondansetron (ZOFRAN) IV, oxyCODONE, promethazine **OR** promethazine (PHENERGAN) injection (IM or IVPB), sodium chloride flush  Allergies as of 12/14/2021 - Review Complete 12/14/2021  Allergen Reaction Noted   Naproxen Nausea Only 09/10/2021   Bactrim [sulfamethoxazole-trimethoprim] Nausea Only 04/06/2012    Family History  Problem Relation Age of Onset   Hypertension Mother    Hypertension Father    Heart disease Father     Social History   Socioeconomic History   Marital status:  Legally Separated    Spouse name: Not on file   Number of children: Not on file   Years of education: Not on file   Highest education level: Not on file  Occupational History   Not on file  Tobacco Use   Smoking status: Every Day    Packs/day: 0.50    Years: 30.00    Pack years: 15.00    Types: Cigarettes   Smokeless tobacco: Never  Vaping Use   Vaping Use: Never used  Substance and Sexual Activity   Alcohol use: Yes    Comment: rare   Drug use: Not Currently    Types: Marijuana    Comment: occ-   Sexual activity: Not on file  Other Topics Concern   Not on file  Social History Narrative   Not on file   Social Determinants of Health   Financial Resource Strain: Not on file  Food Insecurity: Not on file  Transportation Needs: Not on file  Physical Activity: Not on file  Stress: Not on file  Social Connections: Not on file  Intimate Partner Violence: Not on file    Review of Systems: Review of Systems  Constitutional:  Negative for chills and fever.  HENT:  Negative for ear discharge and nosebleeds.   Eyes:  Negative for blurred vision and double vision.  Respiratory:  Negative for cough and hemoptysis.   Cardiovascular:  Negative for chest pain and palpitations.  Gastrointestinal:  Negative for abdominal pain, blood in stool, constipation, diarrhea, heartburn, melena, nausea and vomiting.       Dysphagia  Genitourinary:  Negative for dysuria and urgency.  Musculoskeletal:  Negative for myalgias and neck pain.  Skin:  Negative for itching and rash.  Neurological:  Negative for seizures and loss of consciousness.  Psychiatric/Behavioral:  Negative for substance abuse. The patient is not nervous/anxious.     Physical Exam:Physical Exam Constitutional:      Appearance: Normal appearance.  HENT:     Head: Normocephalic and atraumatic.     Nose: Nose normal. No congestion.     Mouth/Throat:     Mouth: Mucous membranes are moist.     Pharynx: Oropharynx is clear.   Eyes:     Extraocular Movements: Extraocular movements intact.     Conjunctiva/sclera: Conjunctivae normal.  Cardiovascular:     Rate and Rhythm: Normal rate and regular rhythm.  Pulmonary:     Effort: Pulmonary effort is normal. No respiratory distress.  Abdominal:     General: Abdomen is flat. Bowel sounds are normal. There is no distension.     Palpations: Abdomen is soft. There is no mass.     Tenderness: There is no abdominal tenderness. There is no guarding or rebound.     Hernia: No hernia is present.  Musculoskeletal:        General: No swelling. Normal range of motion.     Cervical back: Normal range of motion and neck supple.  Skin:    General: Skin is warm and dry.  Neurological:     General: No focal deficit present.     Mental Status: She is alert and oriented to person, place, and time.  Psychiatric:        Mood and Affect: Mood normal.        Behavior: Behavior normal.        Thought Content: Thought content normal.        Judgment: Judgment normal.    Vital signs: Vitals:   12/24/21 0800 12/24/21 1200  BP:    Pulse:    Resp:    Temp: 97.9 F (36.6 C) 98.6 F (37 C)  SpO2:     Last BM Date: 12/16/21    GI:  Lab Results: Recent Labs    12/23/21 0449 12/23/21 1643 12/24/21 0406  WBC 5.0 7.3 7.1  HGB 8.4* 9.6* 9.2*  HCT 25.5* 28.5* 28.3*  PLT 39* 54* 52*   BMET Recent Labs    12/22/21 0514 12/23/21 0449 12/24/21 0406  NA 132* 133* 133*  K 4.0 4.1 3.8  CL 100 101 100  CO2 25 24 24   GLUCOSE 127* 123* 86  BUN 28* 29* 27*  CREATININE 1.05* 0.90 0.92  CALCIUM 8.9 8.5* 9.0   LFT Recent Labs    12/24/21 0406  PROT 6.1*  ALBUMIN 2.8*  AST 10*  ALT 17  ALKPHOS 60  BILITOT 0.7   PT/INR No results for input(s): LABPROT, INR in the last 72 hours.   Studies/Results: No results found.  Impression: Dysphagia; possible radiation induced  stricture/esophagitis - Swallow study 1/30: Mild pharyngeal dysphagia and severe esophageal  dysphagia.  Stasis of barium in the esophagus just below the UES. - BUN 27, Cr. 0.92 - hgb 9.2, stable - no leukocytosis  Pancytopenia - hgb 9.2 - WBC 7.1 - RBC 2.90 - Platelets 52  Small cell lung cancer  COPD  Tobacco abuse  Plan: Will obtain Barium swallow study with tablet.  Pending results, we will possibly move forward with EGD with dilation.  Spoke with patient and she is agreeable to EGD. As long as pancytopenia remains stable Continue supportive care as needed [facility] will follow    LOS: 10 days   Kushal Saunders Radford Pax  PA-C 12/24/2021, 2:37 PM  Contact #  [phone]

## 2021-12-24 NOTE — Progress Notes (Signed)
Modified Barium Swallow Progress Note  Patient Details  Name: [name] MRN: [id number] Date of Birth: 03-27-1971  Today's Date: 12/24/2021  Modified Barium Swallow completed.  Full report located under Chart Review in the Imaging Section.  Brief recommendations include the following:     (First photo showing esophageal backflow of barium into pharynx, second photo showing barium stasis)  Clinical Impression  Patient presents with a normal oral phase of swallow, mild pharyngeal phase dysphagia and a severe esophageal dysphagia. First bolus tested was small cup sip of thin liquid barium which resulted in mild swallow initiation delay to vallecular sinus but no penetration or aspiration. Second bolus tested was 1/2 spoonful of puree texture. During pharyngeal transit of puree, thin liquid barium could be seen refluxing up into pharynx. After initial swallow, puree and likely thin liquid barium mix almost immediately refluxed back up into pharynx. Patient was able regurgitate barium by 'hocking' it up after it had moved into vallecular sinus. Esophageal sweep revealed stasis of barium in esophagus just below UES. Barium continued to reflux from below UES and into pharynx. SLP concluded test at this time and patient was able to continue to regurgitate barium and expectorate into emesis bag. SLP informed MD of results. At this time, although patient did not exhibit any aspiration or penetration of thin liquids or puree solids, her esophageal phase of swallow is severely impaired. SLP recommending NPO except for ice chips, water or other liquid sips for pleasure. Patient is understanding and at this time, PO's are not giving her any pleasure. SLP to continue to follow patient with plan to consult more with MD regarding POC moving foward.   Swallow Evaluation Recommendations       SLP Diet Recommendations: NPO;Ice chips PRN after oral care;Other (Comment) (liquids for pleasure only)   Liquid  Administration via: Cup   Medication Administration: Via alternative means   Supervision: Patient able to self feed   Compensations: Small sips/bites   Postural Changes: Seated upright at 90 degrees   Oral Care Recommendations: Oral care BID;Patient independent with oral care      Sonia Baller, MA, CCC-SLP Speech Therapy

## 2021-12-24 NOTE — Progress Notes (Signed)
ANTICOAGULATION CONSULT NOTE - follow up  Pharmacy Consult for Heparin Indication: DVT  Allergies  Allergen Reactions   Naproxen Nausea Only   Bactrim [Sulfamethoxazole-Trimethoprim] Nausea Only    Patient Measurements: Height: 5\' 8"  (172.7 cm) Weight: 68.8 kg (151 lb 10.8 oz) IBW/kg (Calculated) : 63.9 Heparin Dosing Weight: TBW  Vital Signs: Temp: 97.4 F (36.3 C) (01/29 2322) Temp Source: Oral (01/29 2322) BP: 130/82 (01/30 0200) Pulse Rate: 96 (01/30 0200)  Labs: Recent Labs    12/21/21 0503 12/21/21 1627 12/22/21 0514 12/22/21 1530 12/23/21 0200 12/23/21 0449 12/23/21 0817 12/23/21 1643 12/24/21 0406  HGB 7.7*  --  7.1*   < >  --  8.4*  --  9.6* 9.2*  HCT 22.4*  --  21.4*   < >  --  25.5*  --  28.5* 28.3*  PLT 32*  --  25*   < >  --  39*  --  54* 52*  HEPARINUNFRC  --    < > 0.54   < > 0.42  --  0.50  --  0.31  CREATININE 0.95  --  1.05*  --   --  0.90  --   --   --    < > = values in this interval not displayed.     Estimated Creatinine Clearance: 75.4 mL/min (by C-G formula based on SCr of 0.9 mg/dL).   Medical History: Past Medical History:  Diagnosis Date   Ankle sprain    left   Anxiety    treated for panic attacks in the past.   Asthma    COPD (chronic obstructive pulmonary disease) ([place])    Depression    History of radiation therapy    Right lung- 09/26/21-11/13/21- Dr. Gery Pray   Irregular heart rate    Lung cancer [facility])    Renal disorder    kidney infections    Medications:  Infusions:   sodium chloride Stopped (12/19/21 1435)   ceFEPime (MAXIPIME) IV Stopped (12/23/21 2148)   famotidine (PEPCID) IV Stopped (12/23/21 1247)   heparin 1,550 Units/hr (12/24/21 0400)   promethazine (PHENERGAN) injection (IM or IVPB) Stopped (12/21/21 1155)    Assessment: 13 yoF admtted on 1/20 with N/V, respiratory failure.  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  Pharmacy is now consulted to dose Heparin  for  acute upper extremity DVT in the setting of PICC line.     No recent anticoagulation.  Granix given 1/21- 1/25.  CBC:  Hgb remains low at 7.8, Plt decreased to 41k  Heparin drip was stopped to discuss with oncology about continuing care with heparin in light of platelets.   Oncology recommendation now is:  Target Goals ( plts) : hold for <25, aim for 0.3-0.5 less than 50, and standard dose above 50. No bolus at any point  Goal of Therapy:  Heparin level 0.3-0.5 units/ml Monitor platelets by anticoagulation protocol: Yes  Today, 12/24/21  HL 0.31 therapeutic on 1550 units/hr Hgb is 9.2 plt 52 Per RN no bleeding  Plan:  Platelets < 25k: hold heparin Platelets 25k - 50k: target HL 0.3-0.5 units/mL Platelets > 50k: target HL 0.3-0.7 units/mL NO bolus at any point  Continue heparin drip at 1550 units/hr Check anti-Xa level Daily while on heparin.  Note low goal range 0.3-0.5 units/ml  Check daily CBC - continue to monitor H&H and platelets   Dolly Rias RPh 12/24/2021, 4:59 AM

## 2021-12-24 NOTE — Assessment & Plan Note (Signed)
Trazodone, qtc appropriate

## 2021-12-24 NOTE — Progress Notes (Signed)
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  Currently on chemotherapy, last infusion on 12/06/2021.  Her nausea and vomiting has progressed and she has been unable to keep her medications down.  Patient denied fever, no chills, no headache, no diarrhea..   In the ED, temperature 98.4 F, HR 145, RR 18, BP 104/30, SPO2 100% on room air.  Sodium 133, potassium 3.7, chloride 102, CO2 16, glucose 145, BUN 22, creatinine 1.64, AST 17, ALT 13, total bilirubin 0.9.  WBC 0.3, hemoglobin 7.0, platelets 6.  High sensitivity troponin 3, <2.  Lactic acid 2.9.  Chest x-ray with no active cardiopulmonary disease process, similar small right suprahilar opacity.  CT angiogram chest with no evidence of pulmonary embolism, improving mediastinal/right hilar mass/adenopathy.  Oncology was consulted.  TRH consulted for further evaluation and management of intractable nausea/vomiting, pancytopenia.    Assessment & Plan:   Principal Problem:   Acute respiratory failure (HCC) Active Problems:   Pneumonia   Aspiration pneumonia (HCC)   Neutropenic fever ([place])   Chemotherapy induced nausea and vomiting   Radiation-induced esophagitis   Pancytopenia (HCC)   Sinus tachycardia   Hypomagnesemia   Hypokalemia   Hypophosphatemia   Electrolyte abnormality   DVT of upper extremity (deep vein thrombosis) (HCC)   Hyponatremia   AKI (acute kidney  injury) ([place])   Malignant neoplasm of right upper lobe of lung (HCC)   Cancer related pain   Tobacco abuse   Goals of care, counseling/discussion   Insomnia   Dehydration   Chest pain   Hyperglycemia   Leukopenia due to antineoplastic chemotherapy (HCC)   Normocytic anemia   Generalized weakness   * Acute respiratory failure (HCC) Weaned to RA today CT 1/26 with circumferential wall thickening of RUL bronchus with intraluminal debris and subsequent bronchial narrowing, RLL bronchial thickening with intraluminal debris and adjacent peribronchovascular opacities in RLL - infectious vs inflammatory Seen by pulm, concern for element of vocal cord dysfunction or radiation induced upper airway inflammation Aggressive reflux treatment Reflux precautions bipap prn Lasix as tolerated per pulm  Will trial steroids - steroid taper starting today Continue Cefepime, 7 day course (1/23 - present) Treating for aspiration pneumonia, radiation induced upper airway inflammation Seems to be improving with lasix/steroids/abx - continue    Aspiration pneumonia (HCC) Continue cefepime, follow (plan for 7 days)  Neutropenic fever ([place]) Last true fever 1/23 granix per oncology Neutropenia resolved   Radiation-induced esophagitis PPI H2 blocker sucralfate  relfux precautions  Chemotherapy induced nausea and vomiting Continue antiemetics  Sinus tachycardia 2/2 above Started on low dose scheduled metop on 1/24 eICU scheduled PO metop 1/26 overnight Echo with EF 60-65%, RVSF normal (see report) Suspect driven by respiratory issues above Repeat CT to r/o PE given RUE DVT, negative for PE   Pancytopenia ([place])- (present on admission) Related to chemo Fluctuating and persistent thrombocytopenia and anemia WBC improved Improved after transfusion (pRBC's and platelets)  Specific attention to thrombocytopenia with need for anticoagulation, once  recovers further will consider transitioning to  doac for her upper extremity DVT Will continue to transfuse as needed  DVT of upper extremity (deep vein thrombosis) (HCC) Related to PICC Discussed with oncology given thrombocytopenia -> recommending holding anticoagulation for platelets <25, plan to aim for lower range of therapeutic for heparin when platelets 25-50, and standard dose anticoagulation for >50 Will leave picc for now as it continues to function      Electrolyte abnormality Replace potassium, magnesium, phosphorus as needed  AKI (acute kidney injury) ([place])- (present on admission) resolved  Hyponatremia- (present on admission) resolved  Cancer related pain Oxycontin, oxycodone, dilaudid for breakthrough  Malignant neoplasm of right upper lobe of lung ([place])- (present on admission) Small Cell Lung Cancer Follows with Dr. Julien Nordmann outpatient Last chemo 12/06/2021  Tobacco abuse Encouraged cessation  Goals of care, counseling/discussion Per prior notes, patient contemplating stopping chemo Appreciate palliative care assistance (1/27 note)  Insomnia Trazodone, qtc appropriate   DVT prophylaxis: SCD Code Status: full Family Communication: mother Disposition:   Status is: Inpatient  Remains inpatient appropriate because: need for further inpatient care, goc discussions, IV steroids, IV abx       Consultants:  PCCM Oncology Palliative care  Procedures:  none  Antimicrobials:  Anti-infectives (From admission, onward)    Start     Dose/Rate Route Frequency Ordered Stop   12/18/21 0800  vancomycin (VANCOREADY) IVPB 1000 mg/200 mL  Status:  Discontinued        1,000 mg 200 mL/hr over 60 Minutes Intravenous Every 12 hours 12/17/21 1859 12/19/21 0912   12/17/21 2200  ceFEPIme (MAXIPIME) 2 g in sodium chloride 0.9 % 100 mL IVPB  Status:  Discontinued        2 g 200 mL/hr over 30 Minutes Intravenous Every 8 hours 12/17/21 1750 12/17/21 1756   12/17/21 1915  vancomycin (VANCOREADY) IVPB 1500 mg/300  mL        1,500 mg 150 mL/hr over 120 Minutes Intravenous  Once 12/17/21 1800 12/17/21 2057   12/17/21 1845  ceFEPIme (MAXIPIME) 2 g in sodium chloride 0.9 % 100 mL IVPB        2 g 200 mL/hr over 30 Minutes Intravenous Every 8 hours 12/17/21 1756 12/24/21 2159       Subjective: Feels so so Doesn't feel as good today, but encouraged as we talk about eventual discharge  Objective: Vitals:   12/24/21 0635 12/24/21 0640 12/24/21 0645 12/24/21 0650  BP:      Pulse: (!) 105 100 95 94  Resp: (!) 21 12 18 15   Temp:      TempSrc:      SpO2: 98% 98% 99% 97%  Weight:      Height:        Intake/Output Summary (Last 24 hours) at 12/24/2021 0854 Last data filed at 12/24/2021 0618 Gross per 24 hour  Intake 750.36 ml  Output 3100 ml  Net -2349.64 ml   Filed Weights   12/15/21 0117  Weight: 68.8 kg    Examination:  General: No acute distress. Cardiovascular: RRR, occasional tachycardia Lungs: coarse breath sounds and cough when asked to take deep breaths, able to wean to RA at bedside, maintains to mid 90's sat Abdomen: Soft, nontender, nondistended  Neurological: Alert and oriented 3. Moves all extremities 4. Cranial nerves II through XII grossly intact. Skin: Warm and dry. No rashes or lesions. Extremities: No clubbing or cyanosis. No edema.   Data Reviewed: I have personally reviewed following labs and imaging  studies  CBC: Recent Labs  Lab 12/21/21 0503 12/22/21 0514 12/22/21 1948 12/23/21 0449 12/23/21 1643 12/24/21 0406  WBC 6.2 5.6 8.8 5.0 7.3 7.1  NEUTROABS 4.5 3.0  --  4.0 3.4 4.3  HGB 7.7* 7.1* 9.0* 8.4* 9.6* 9.2*  HCT 22.4* 21.4* 27.1* 25.5* 28.5* 28.3*  MCV 94.5 96.4 94.1 97.0 94.4 97.6  PLT 32* 25* 54* 39* 54* 52*    Basic Metabolic Panel: Recent Labs  Lab 12/20/21 0435 12/21/21 0503 12/22/21 0513 12/22/21 0514 12/23/21 0449 12/24/21 0406  NA 134* 135  --  132* 133* 133*  K 3.8 3.3*  --  4.0 4.1 3.8  CL 102 100  --  100 101 100  CO2 25 25   --  25 24 24   GLUCOSE 131* 126*  --  127* 123* 86  BUN 20 25*  --  28* 29* 27*  CREATININE 0.87 0.95  --  1.05* 0.90 0.92  CALCIUM 9.1 8.7*  --  8.9 8.5* 9.0  MG 1.4* 1.9 1.5*  --  1.6* 1.9  PHOS 2.0* 2.9 2.9  --  3.2 2.3*    GFR: Estimated Creatinine Clearance: 73.8 mL/min (by C-G formula based on SCr of 0.92 mg/dL).  Liver Function Tests: Recent Labs  Lab 12/20/21 0435 12/21/21 0503 12/22/21 0514 12/23/21 0449 12/24/21 0406  AST 20 23 13* 11* 10*  ALT 27 32 25 19 17   ALKPHOS 68 77 63 61 60  BILITOT 0.9 0.8 0.9 0.5 0.7  PROT 6.4* 6.4* 5.7* 5.8* 6.1*  ALBUMIN 2.7* 2.7* 2.5* <1.5* 2.8*    CBG: No results for input(s): GLUCAP in the last 168 hours.   Recent Results (from the past 240 hour(s))  Culture, blood (routine x 2)     Status: None   Collection Time: 12/14/21  4:26 PM   Specimen: BLOOD RIGHT HAND  Result Value Ref Range Status   Specimen Description   Final    BLOOD RIGHT HAND Performed at [facility] [address], [place], [place] [address]    Special Requests   Final    BOTTLES DRAWN AEROBIC AND ANAEROBIC Blood Culture results may not be optimal due to an inadequate volume of blood received in culture bottles Performed at [facility] [address], [place], [place] [address]    Culture   Final    NO GROWTH 5 DAYS Performed at [facility], [place] [address], [place], [place] [address]    Report Status 12/19/2021 FINAL  Final  Culture, blood (routine x 2)     Status: None   Collection Time: 12/14/21  4:31 PM   Specimen: Left Antecubital; Blood  Result Value Ref Range Status   Specimen Description   Final    LEFT ANTECUBITAL Performed at [facility] [address], [place], [place] [address]    Special Requests   Final    BOTTLES DRAWN AEROBIC AND ANAEROBIC Blood Culture adequate volume Performed at [facility] [address], [place], [place] [address]     Culture   Final    NO GROWTH 5 DAYS Performed at [facility], [place] [address] [place], [place] [address]    Report Status 12/19/2021 FINAL  Final  Resp Panel by RT-PCR (Flu Laylia Mui&B, Covid) Nasopharyngeal Swab     Status: None   Collection Time: 12/14/21  4:38 PM   Specimen: Nasopharyngeal Swab; Nasopharyngeal(NP) swabs in vial transport medium  Result Value Ref  Range Status   SARS Coronavirus 2 by RT PCR NEGATIVE NEGATIVE Final    Comment: (NOTE) SARS-CoV-2 target nucleic acids are NOT DETECTED.  The SARS-CoV-2 RNA is generally detectable in upper respiratory specimens during the acute phase of infection. The lowest concentration of SARS-CoV-2 viral copies this assay can detect is 138 copies/mL. Markesia Crilly negative result does not preclude SARS-Cov-2 infection and should not be used as the sole basis for treatment or other patient management decisions. Thersa Mohiuddin negative result may occur with  improper specimen collection/handling, submission of specimen other than nasopharyngeal swab, presence of viral mutation(s) within the areas targeted by this assay, and inadequate number of viral copies(<138 copies/mL). Jamariya Davidoff negative result must be combined with clinical observations, patient history, and epidemiological information. The expected result is Negative.  Fact Sheet for Patients:  EntrepreneurPulse.com.au  Fact Sheet for Healthcare Providers:  IncredibleEmployment.be  This test is no t yet approved or cleared by the [place] FDA and  has been authorized for detection and/or diagnosis of SARS-CoV-2 by FDA under an Emergency Use Authorization (EUA). This EUA will remain  in effect (meaning this test can be used) for the duration of the COVID-19 declaration under Section 564(b)(1) of the Act, 21 U.S.C.section 360bbb-3(b)(1), unless the authorization is terminated  or revoked sooner.       Influenza Dhruvi Crenshaw by PCR NEGATIVE NEGATIVE Final   Influenza B by PCR  NEGATIVE NEGATIVE Final    Comment: (NOTE) The Xpert Xpress SARS-CoV-2/FLU/RSV plus assay is intended as an aid in the diagnosis of influenza from Nasopharyngeal swab specimens and should not be used as Javi Bollman sole basis for treatment. Nasal washings and aspirates are unacceptable for Xpert Xpress SARS-CoV-2/FLU/RSV testing.  Fact Sheet for Patients: EntrepreneurPulse.com.au  Fact Sheet for Healthcare Providers: IncredibleEmployment.be  This test is not yet approved or cleared by the [place] FDA and has been authorized for detection and/or diagnosis of SARS-CoV-2 by FDA under an Emergency Use Authorization (EUA). This EUA will remain in effect (meaning this test can be used) for the duration of the COVID-19 declaration under Section 564(b)(1) of the Act, 21 U.S.C. section 360bbb-3(b)(1), unless the authorization is terminated or revoked.  Performed at [facility], [place] [address], [place], [place] [address]   Culture, blood (routine x 2)     Status: None   Collection Time: 12/17/21  6:32 PM   Specimen: BLOOD LEFT HAND  Result Value Ref Range Status   Specimen Description   Final    BLOOD LEFT HAND Performed at [facility] [address], [place], [place] [address]    Special Requests   Final    BOTTLES DRAWN AEROBIC ONLY Blood Culture results may not be optimal due to an inadequate volume of blood received in culture bottles Performed at [facility] [address], [place], [place] [address]    Culture   Final    NO GROWTH 5 DAYS Performed at [facility], [place] [address], [place], [place] [address]    Report Status 12/22/2021 FINAL  Final  Culture, blood (routine x 2)     Status: None   Collection Time: 12/17/21  6:32 PM   Specimen: BLOOD LEFT FOREARM  Result Value Ref Range Status   Specimen Description   Final    BLOOD LEFT FOREARM Performed at [facility] [address] [place],  [address]    Special Requests   Final    BOTTLES DRAWN AEROBIC  ONLY Blood Culture results may not be optimal due to an inadequate volume of blood received in culture bottles Performed at [facility], [place] [address] [place], [place] [address]    Culture   Final    NO GROWTH 5 DAYS Performed at [facility], [place] [address], [place], [place] [address]    Report Status 12/22/2021 FINAL  Final  Urine Culture     Status: Abnormal   Collection Time: 12/18/21  3:00 AM   Specimen: Urine, Clean Catch  Result Value Ref Range Status   Specimen Description   Final    URINE, CLEAN CATCH Performed at [facility], [place] [address], [place], [place] [address]    Special Requests   Final    Immunocompromised Performed at [facility], [place] [address], [place], [place] [address]    Culture (Chais Fehringer)  Final    <10,000 COLONIES/mL INSIGNIFICANT GROWTH Performed at [facility] [address], [place], [place] [address]    Report Status 12/19/2021 FINAL  Final  MRSA Next Gen by PCR, Nasal     Status: None   Collection Time: 12/19/21  5:40 AM   Specimen: Nasal Mucosa; Nasal Swab  Result Value Ref Range Status   MRSA by PCR Next Gen NOT DETECTED NOT DETECTED Final    Comment: (NOTE) The GeneXpert MRSA Assay (FDA approved for NASAL specimens only), is one component of [facility] [place] comprehensive MRSA colonization surveillance program. It is not intended to diagnose MRSA infection nor to guide or monitor treatment for MRSA infections. Test performance is not FDA approved in patients less than 90 years old. Performed at [facility], [place] [address] [place], [place] [address]          Radiology Studies: No results found.      Scheduled Meds:  sodium chloride   Intravenous Once   chlorhexidine  15 mL Mouth Rinse BID   Chlorhexidine Gluconate Cloth  6 each Topical Daily    feeding supplement  237 mL Oral BID BM   mouth rinse  15 mL Mouth Rinse q12n4p   metoprolol tartrate  12.5 mg Oral BID   multivitamin with minerals  1 tablet Oral Daily   nicotine  21 mg Transdermal Daily   oxyCODONE  20 mg Oral Q12H   pantoprazole (PROTONIX) IV  40 mg Intravenous Q12H   predniSONE  50 mg Oral Q breakfast   Followed by   Derrill Memo ON 12/27/2021] predniSONE  40 mg Oral Q breakfast   Followed by   Derrill Memo ON 12/30/2021] predniSONE  30 mg Oral Q breakfast   Followed by   Derrill Memo ON 01/02/2022] predniSONE  20 mg Oral Q breakfast   Followed by   Derrill Memo ON 01/05/2022] predniSONE  10 mg Oral Q breakfast   sodium chloride flush  10-40 mL Intracatheter Q12H   sucralfate  1 g Oral TID WC & HS   traZODone  50 mg Oral QHS   Continuous Infusions:  sodium chloride Stopped (12/19/21 1435)   ceFEPime (MAXIPIME) IV Stopped (12/24/21 7619)   famotidine (PEPCID) IV Stopped (12/23/21 1247)   heparin 1,550 Units/hr (12/24/21 0400)   promethazine (PHENERGAN) injection (IM or IVPB) Stopped (12/21/21 1155)     LOS: 10 days    Time spent: over 38 min    Fayrene Helper, MD [facility]   To contact the attending provider between 7A-7P or the covering provider during after hours 7P-7A, please log into  the web site [email or web address] and access using universal [place] password for that web site. If you do not have the password, please call the hospital operator.  12/24/2021, 8:54 AM
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
## 2021-12-24 NOTE — Progress Notes (Signed)
Chaplain engaged in an initial visit with [name] shared about her healthcare journey and what brought her to the ICU.  [name] talked about her reaction to chemo and how hard it has been on her body.  She voiced that she has missed so much to being sick and hospitalized.  She declared out loud that she was done not living.  She desires to have some quality of life right now to spend with those that she loves.  She shared that if her cancer does return she won't take chemo again.  She is ready to spend time with her 43 year old granddaughter and get back to work.  When Chaplain asked her if she would continue taking chemo if it meant it would prolong her life and she would have more time later despite the pain, [name] voiced that she had thought about that and she still chooses life right now.  She has realized that no day is promised and wants to take hold of what is right in front of her.    [name] is excited about moving off of the ICU and being discharged.  She is also grateful for her support system.  She was talking about how her mom had been with her every step of the way.  Chaplain was also able to meet [name]'s mom shortly after.   Chaplain offered listening, support and presence.     12/24/21 1200  Clinical Encounter Type  Visited With Patient and family together;Patient  Visit Type Initial  Referral From Palliative care team  Consult/Referral To Chaplain

## 2021-12-25 ENCOUNTER — Inpatient Hospital Stay: Payer: Medicaid Other | Admitting: Internal Medicine

## 2021-12-25 ENCOUNTER — Inpatient Hospital Stay: Payer: Medicaid Other | Admitting: Dietician

## 2021-12-25 ENCOUNTER — Inpatient Hospital Stay: Payer: Medicaid Other

## 2021-12-25 ENCOUNTER — Ambulatory Visit: Payer: Medicaid Other

## 2021-12-25 DIAGNOSIS — J9601 Acute respiratory failure with hypoxia: Secondary | ICD-10-CM | POA: Diagnosis not present

## 2021-12-25 DIAGNOSIS — K222 Esophageal obstruction: Secondary | ICD-10-CM

## 2021-12-25 LAB — COMPREHENSIVE METABOLIC PANEL
ALT: 16 U/L (ref 0–44)
AST: 10 U/L — ABNORMAL LOW (ref 15–41)
Albumin: 2.9 g/dL — ABNORMAL LOW (ref 3.5–5.0)
Alkaline Phosphatase: 59 U/L (ref 38–126)
Anion gap: 8 (ref 5–15)
BUN: 20 mg/dL (ref 6–20)
CO2: 30 mmol/L (ref 22–32)
Calcium: 9.1 mg/dL (ref 8.9–10.3)
Chloride: 99 mmol/L (ref 98–111)
Creatinine, Ser: 0.92 mg/dL (ref 0.44–1.00)
GFR, Estimated: >60 mL/min (ref >60)
Glucose, Bld: 82 mg/dL (ref 70–99)
Potassium: 3.4 mmol/L — ABNORMAL LOW (ref 3.5–5.1)
Sodium: 137 mmol/L (ref 135–145)
Total Bilirubin: 0.7 mg/dL (ref 0.3–1.2)
Total Protein: 6.1 g/dL — ABNORMAL LOW (ref 6.5–8.1)

## 2021-12-25 LAB — CBC WITH DIFFERENTIAL/PLATELET
Abs Immature Granulocytes: 2.33 10*3/uL — ABNORMAL HIGH (ref 0.00–0.07)
Basophils Absolute: 0 10*3/uL (ref 0.0–0.1)
Basophils Relative: 0 %
Eosinophils Absolute: 0 10*3/uL (ref 0.0–0.5)
Eosinophils Relative: 0 %
HCT: 28.5 % — ABNORMAL LOW (ref 36.0–46.0)
Hemoglobin: 9.7 g/dL — ABNORMAL LOW (ref 12.0–15.0)
Immature Granulocytes: 30 %
Lymphocytes Relative: 12 %
Lymphs Abs: 0.9 10*3/uL (ref 0.7–4.0)
MCH: 32.8 pg (ref 26.0–34.0)
MCHC: 34 g/dL (ref 30.0–36.0)
MCV: 96.3 fL (ref 80.0–100.0)
Monocytes Absolute: 1.3 10*3/uL — ABNORMAL HIGH (ref 0.1–1.0)
Monocytes Relative: 17 %
Neutro Abs: 3.2 10*3/uL (ref 1.7–7.7)
Neutrophils Relative %: 41 %
Platelets: 58 10*3/uL — ABNORMAL LOW (ref 150–400)
RBC: 2.96 MIL/uL — ABNORMAL LOW (ref 3.87–5.11)
RDW: 15.2 % (ref 11.5–15.5)
WBC: 7.8 10*3/uL (ref 4.0–10.5)
nRBC: 0.5 % — ABNORMAL HIGH (ref 0.0–0.2)

## 2021-12-25 LAB — MAGNESIUM: Magnesium: 1.5 mg/dL — ABNORMAL LOW (ref 1.7–2.4)

## 2021-12-25 LAB — PHOSPHORUS: Phosphorus: 4.6 mg/dL (ref 2.5–4.6)

## 2021-12-25 LAB — HEPARIN LEVEL (UNFRACTIONATED): Heparin Unfractionated: 0.55 IU/mL (ref 0.30–0.70)

## 2021-12-25 MED ORDER — HYDROCODONE BIT-HOMATROP MBR 5-1.5 MG/5ML PO SOLN
5.0000 mL | Freq: Four times a day (QID) | ORAL | Status: DC | PRN
  Administered 2021-12-25: 5 mL via ORAL
  Filled 2021-12-25: qty 5

## 2021-12-25 MED ORDER — HEPARIN (PORCINE) 25000 UT/250ML-% IV SOLN: 1550.0000 [IU]/h | INTRAVENOUS | Status: DC

## 2021-12-25 MED ORDER — MAGNESIUM SULFATE 2 GM/50ML IV SOLN
2.0000 g | Freq: Once | INTRAVENOUS | Status: AC
  Administered 2021-12-25: 2 g via INTRAVENOUS
  Filled 2021-12-25: qty 50

## 2021-12-25 MED ORDER — HEPARIN (PORCINE) 25000 UT/250ML-% IV SOLN
1550.0000 [IU]/h | INTRAVENOUS | Status: AC
  Administered 2021-12-25: 1550 [IU]/h via INTRAVENOUS
  Filled 2021-12-25: qty 250

## 2021-12-25 MED ORDER — POTASSIUM CHLORIDE 10 MEQ/100ML IV SOLN
10.0000 meq | INTRAVENOUS | Status: AC
  Administered 2021-12-25 (×6): 10 meq via INTRAVENOUS
  Filled 2021-12-25 (×6): qty 100

## 2021-12-25 NOTE — TOC Progression Note (Signed)
Transition of Care [facility]) - Progression Note    Patient Details  Name: [name] MRN: [id number] Date of Birth: 12-09-1970  Transition [facility]) CM/SW Contact  Ross Ludwig, [place] Phone Number: 12/25/2021, 4:31 PM  Clinical Narrative:    CSW continuing to follow patient's progress throughout discharge planning.   Expected Discharge Plan: Home/Self Care Barriers to Discharge: Continued Medical Work up  Expected Discharge Plan and Services Expected Discharge Plan: Home/Self Care In-house Referral: Clinical Social Work   Post Acute Care Choice: NA Living arrangements for the past 2 months: Single Family Home                 DME Arranged: N/A DME Agency: NA                   Social Determinants of Health (SDOH) Interventions    Readmission Risk Interventions Readmission Risk Prevention Plan 12/17/2021  Transportation Screening Complete  HRI or [place] Complete  Social Work Consult for [place] Planning/Counseling Complete  Palliative Care Screening Not Applicable  Medication Review [employer]) Complete  Some recent data might be hidden

## 2021-12-25 NOTE — Anesthesia Preprocedure Evaluation (Addendum)
Anesthesia Evaluation  Patient identified by MRN, date of birth, ID band Patient awake    Reviewed: Allergy & Precautions, NPO status , Patient's Chart, lab work & pertinent test results  Airway Mallampati: II  TM Distance: >3 FB Neck ROM: Full    Dental no notable dental hx. (+) Partial Upper, Implants, Dental Advisory Given,    Pulmonary shortness of breath, COPD, Current Smoker,  Non Small Cell CA s/P chemoradiation   Pulmonary exam normal breath sounds clear to auscultation       Cardiovascular negative cardio ROS Normal cardiovascular exam Rhythm:Regular Rate:Normal     Neuro/Psych Anxiety Depression TIA   GI/Hepatic Neg liver ROS,   Endo/Other    Renal/GU Renal diseaseLab Results      Component                Value               Date                      CREATININE               0.92                12/25/2021                BUN                      20                  12/25/2021                NA                       137                 12/25/2021                K                        3.4 (L)             12/25/2021                CL                       99                  12/25/2021                CO2                      30                  12/25/2021                Musculoskeletal  (+) Arthritis ,   Abdominal   Peds  Hematology  (+) anemia , Lab Results      Component                Value               Date                      WBC  7.8                 12/25/2021                HGB                      9.7 (L)             12/25/2021                HCT                      28.5 (L)            12/25/2021                MCV                      96.3                12/25/2021                PLT                      58 (L)              12/25/2021              Anesthesia Other Findings   Reproductive/Obstetrics                           Anesthesia  Physical Anesthesia Plan  ASA: 3  Anesthesia Plan: MAC   Post-op Pain Management:    Induction:   PONV Risk Score and Plan: Treatment may vary due to age or medical condition  Airway Management Planned: Natural Airway and Nasal Cannula  Additional Equipment: None  Intra-op Plan:   Post-operative Plan:   Informed Consent: I have reviewed the patients History and Physical, chart, labs and discussed the procedure including the risks, benefits and alternatives for the proposed anesthesia with the patient or authorized representative who has indicated his/her understanding and acceptance.     Dental advisory given  Plan Discussed with:   Anesthesia Plan Comments: (EGD for dysphagia esophageal stricture)       Anesthesia Quick Evaluation

## 2021-12-25 NOTE — Progress Notes (Signed)
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  Currently on chemotherapy, last infusion on 12/06/2021.  Her nausea and vomiting has progressed and she has been unable to keep her medications down.  Patient denied fever, no chills, no headache, no diarrhea..  She was admitted with CT findings concerning for radiation esophagitis and developed CXR findings concerning for aspiration pneumonia.  Pulmonary was consulted due to progressive respiratory distress and thought her symptoms related to radiation induced upper airway inflammation and/or vocal cord dysfunction.  She's improved with steroids, antibiotics, and lasix from respiratory standpoint.  Being treated with PPI, H2 blocker, and sucralfate for her radiation esophagitis.  Barium swallow concerning for stricture, GI following.  Hospitalization complicated by pancytopenia, RUE DVT now on heparin.  See below for additional details       Assessment & Plan:   Principal Problem:   Acute respiratory failure ([name]) Active Problems:   Pneumonia   Aspiration pneumonia ([name])   Radiation-induced esophagitis   Esophageal stricture   Chemotherapy induced nausea and vomiting   Pancytopenia ([name])   Neutropenic fever ([name])   Sinus tachycardia   Hypomagnesemia   Hypokalemia   Hypophosphatemia   Electrolyte abnormality   DVT of upper extremity (deep vein thrombosis) ([name])    Hyponatremia   AKI (acute kidney injury) ([name])   Malignant neoplasm of right upper lobe of lung ([name])   Cancer related pain   Tobacco abuse   Goals of care, counseling/discussion   Insomnia   Dehydration   Chest pain   Hyperglycemia   Leukopenia due to antineoplastic chemotherapy ([name])   Normocytic anemia   Generalized weakness   * Acute respiratory failure ([name]) Treating for aspiration pneumonia, radiation induced upper airway inflammation Requiring 2-4 L Spray CT 1/26 with circumferential wall thickening of RUL bronchus with intraluminal debris and subsequent bronchial narrowing, RLL bronchial thickening with intraluminal debris and adjacent peribronchovascular opacities in RLL - infectious vs inflammatory Seen by pulm, concern for element of vocal cord dysfunction or radiation induced upper airway inflammation Aggressive reflux treatment Reflux precautions bipap prn Lasix as tolerated per pulm  Will trial steroids  Continue Cefepime, 7 day course (1/23 - 1/30) Seems to be improving with lasix/steroids/abx - will hold lasix today given NPO, continue IV steroids, plan for taper when able to take PO   Aspiration pneumonia ([name]) Completed cefepime  Esophageal stricture Barium swallow concerning for radiation related esophageal stricture GI consulted, appreciate evaluation  Radiation-induced esophagitis PPI H2 blocker sucralfate  relfux precautions  Sinus tachycardia 2/2 above Started on low dose scheduled metop on 1/24 eICU scheduled PO metop 1/26 overnight Echo with EF 60-65%, RVSF normal (see report) Suspect driven by respiratory issues above Repeat CT to r/o PE given RUE DVT, negative for PE   Neutropenic fever ([name]) Last true fever 1/23 granix per oncology Neutropenia resolved   Pancytopenia ([name])- (present on admission) Related to chemo Fluctuating and persistent thrombocytopenia and anemia WBC improved Improved after transfusion (pRBC's and platelets)  Specific attention to thrombocytopenia with need for anticoagulation, once recovers further will consider transitioning to doac for her upper extremity DVT - would discuss with Dr. Julien Nordmann closer to discharge Will continue to transfuse as needed  Chemotherapy induced nausea and vomiting Continue antiemetics Likely related to esophageal stricture  DVT of upper extremity (deep vein thrombosis) ([name]) Related to PICC Discussed with oncology given thrombocytopenia -> recommending holding anticoagulation for platelets <25, plan to aim for lower range of therapeutic for heparin when platelets 25-50, and standard dose anticoagulation for >50 Will leave picc for now as it continues to function      Electrolyte abnormality Replace potassium, magnesium, phosphorus as needed  AKI (acute kidney injury) ([name])- (present on admission) resolved  Hyponatremia- (present on admission) resolved  Cancer related pain Oxycontin, oxycodone, dilaudid for breakthrough  Malignant neoplasm of right upper lobe of lung ([name])- (present on admission) Small Cell Lung Cancer Follows with Dr. Julien Nordmann outpatient Last chemo 12/06/2021  Tobacco abuse Encouraged cessation  Goals of care, counseling/discussion Per prior notes, patient contemplating stopping chemo Appreciate palliative care assistance (1/27 note)  Insomnia Trazodone, qtc appropriate   DVT prophylaxis: SCD Code Status: full Family Communication: mother Disposition:   Status is: Inpatient  Remains inpatient appropriate because: need for further inpatient care, goc discussions, IV steroids, IV abx       Consultants:  PCCM Oncology Palliative care  Procedures:  none  Antimicrobials:  Anti-infectives (From admission, onward)    Start     Dose/Rate Route Frequency Ordered Stop   12/18/21 0800  vancomycin (VANCOREADY) IVPB 1000 mg/200 mL  Status:  Discontinued        1,000 mg 200 mL/hr over 60 Minutes Intravenous Every 12  hours 12/17/21 1859 12/19/21 0912   12/17/21 2200  ceFEPIme (MAXIPIME) 2 g in sodium chloride 0.9 % 100 mL IVPB  Status:  Discontinued        2 g 200 mL/hr over 30 Minutes Intravenous Every 8 hours 12/17/21 1750 12/17/21 1756   12/17/21 1915  vancomycin (VANCOREADY) IVPB 1500 mg/300 mL        1,500 mg 150 mL/hr over 120 Minutes Intravenous  Once 12/17/21 1800 12/17/21 2057   12/17/21 1845  ceFEPIme (MAXIPIME) 2 g in sodium chloride 0.9 % 100 mL IVPB        2 g 200 mL/hr over 30 Minutes Intravenous Every 8 hours 12/17/21 1756 12/24/21 1711       Subjective: Feels worse today than yesterday Frustrated with NPO status  Objective: Vitals:   12/25/21 0800 12/25/21 1115 12/25/21 1200 12/25/21 1536  BP:  126/79  136/87  Pulse:  89  (!) 103  Resp:  20  (!) 22  Temp: 98.2 F (36.8 C)  98.2 F (36.8 C) 97.6 F (36.4 C)  TempSrc: Oral  Oral Oral  SpO2:  96%  94%  Weight:      Height:        Intake/Output Summary (Last 24 hours) at 12/25/2021 1644 Last data filed at 12/25/2021 1519 Gross per 24 hour  Intake 982.95 ml  Output 300 ml  Net 682.95 ml    Filed Weights   12/15/21 0117  Weight: 68.8 kg    Examination:  General: No acute distress. Cardiovascular: RRR Lungs: unlabored on 2 L Abdomen: Soft, nontender, nondistended  Neurological: Alert and oriented 3. Moves all extremities 4 . Cranial nerves II through XII grossly intact. Skin: Warm and dry. No rashes or lesions. Extremities: No clubbing or cyanosis. No  edema.     Data Reviewed: I have personally reviewed following labs and imaging studies  CBC: Recent Labs  Lab 12/22/21 0514 12/22/21 1948 12/23/21 0449 12/23/21 1643 12/24/21 0406 12/25/21 0500  WBC 5.6 8.8 5.0 7.3 7.1 7.8  NEUTROABS 3.0  --  4.0 3.4 4.3 3.2  HGB 7.1* 9.0* 8.4* 9.6* 9.2* 9.7*  HCT 21.4* 27.1* 25.5* 28.5* 28.3* 28.5*  MCV 96.4 94.1 97.0 94.4 97.6 96.3  PLT 25* 54* 39* 54* 52* 58*     Basic Metabolic Panel: Recent Labs  Lab  12/21/21 0503 12/22/21 0513 12/22/21 0514 12/23/21 0449 12/24/21 0406 12/25/21 0500  NA 135  --  132* 133* 133* 137  K 3.3*  --  4.0 4.1 3.8 3.4*  CL 100  --  100 101 100 99  CO2 25  --  25 24 24 30   GLUCOSE 126*  --  127* 123* 86 82  BUN 25*  --  28* 29* 27* 20  CREATININE 0.95  --  1.05* 0.90 0.92 0.92  CALCIUM 8.7*  --  8.9 8.5* 9.0 9.1  MG 1.9 1.5*  --  1.6* 1.9 1.5*  PHOS 2.9 2.9  --  3.2 2.3* 4.6     GFR: Estimated Creatinine Clearance: 73.8 mL/min (by C-G formula based on SCr of 0.92 mg/dL).  Liver Function Tests: Recent Labs  Lab 12/21/21 0503 12/22/21 0514 12/23/21 0449 12/24/21 0406 12/25/21 0500  AST 23 13* 11* 10* 10*  ALT 32 25 19 17 16   ALKPHOS 77 63 61 60 59  BILITOT 0.8 0.9 0.5 0.7 0.7  PROT 6.4* 5.7* 5.8* 6.1* 6.1*  ALBUMIN 2.7* 2.5* <1.5* 2.8* 2.9*     CBG: No results for input(s): GLUCAP in the last 168 hours.   Recent Results (from the past 240 hour(s))  Culture, blood (routine x 2)     Status: None   Collection Time: 12/17/21  6:32 PM   Specimen: BLOOD LEFT HAND  Result Value Ref Range Status   Specimen Description   Final    BLOOD LEFT HAND Performed at [facility] [address] [place], [place] [address]    Special Requests   Final    BOTTLES DRAWN AEROBIC ONLY Blood Culture results may not be optimal due to an inadequate volume of blood received in culture bottles Performed at [facility] [address] [place], [place] [address]    Culture   Final    NO GROWTH 5 DAYS Performed at [facility], [place] [address] [place], [place] [address]    Report Status 12/22/2021 FINAL  Final  Culture, blood (routine x 2)     Status: None   Collection Time: 12/17/21  6:32 PM   Specimen: BLOOD LEFT FOREARM  Result Value Ref Range Status   Specimen Description   Final    BLOOD LEFT FOREARM Performed at [facility] [address] [place], [place] [address]    Special  Requests   Final    BOTTLES DRAWN AEROBIC ONLY Blood Culture results may not be optimal due to an inadequate volume of blood received in culture bottles Performed at [facility] [address] [place], [place] [address]    Culture   Final    NO GROWTH 5 DAYS Performed at [facility], [place] [address] [place], [place] [address]    Report Status 12/22/2021 FINAL  Final  Urine Culture     Status:  Abnormal   Collection Time: 12/18/21  3:00 AM   Specimen: Urine, Clean Catch  Result Value Ref Range Status   Specimen Description   Final    URINE, CLEAN CATCH Performed at [facility], [place] [address], [place], [place] [address]    Special Requests   Final    Immunocompromised Performed at [facility], [place] [address] [place], [place] [address]    Culture (Shelia Magallon)  Final    <10,000 COLONIES/mL INSIGNIFICANT GROWTH Performed at [facility] [address] [place], [place] [address]    Report Status 12/19/2021 FINAL  Final  MRSA Next Gen by PCR, Nasal     Status: None   Collection Time: 12/19/21  5:40 AM   Specimen: Nasal Mucosa; Nasal Swab  Result Value Ref Range Status   MRSA by PCR Next Gen NOT DETECTED NOT DETECTED Final    Comment: (NOTE) The GeneXpert MRSA Assay (FDA approved for NASAL specimens only), is one component of Shelvie Salsberry comprehensive MRSA colonization surveillance program. It is not intended to diagnose MRSA infection nor to guide or monitor treatment for MRSA infections. Test performance is not FDA approved in patients less than 30 years old. Performed at [facility], [place] [address] [place], [place] [address]           Radiology Studies: DG Swallowing Func-Speech Pathology  Result Date: 12/24/2021 Table formatting from the original result was not included. Objective Swallowing Evaluation: Type of Study: MBS-Modified Barium Swallow Study  Patient Details Name: KMARI BRIAN  MRN: [id number] Date of Birth: [birth year] Today's Date: 12/24/2021 Time: SLP Start Time (ACUTE ONLY): 1400 -SLP Stop Time (ACUTE ONLY): 1425 SLP Time Calculation (min) (ACUTE ONLY): 25 min Past Medical History: Past Medical History: Diagnosis Date  Ankle sprain   left  Anxiety   treated for panic attacks in the past.  Asthma   COPD (chronic obstructive pulmonary disease) ([name])   Depression   History of radiation therapy   Right lung- 09/26/21-11/13/21- Dr. Gery Pray  Irregular heart rate   Lung cancer [facility])   Renal disorder   kidney infections Past Surgical History: Past Surgical History: Procedure Laterality Date  APPENDECTOMY    BRONCHIAL BIOPSY  09/21/2021  Procedure: BRONCHIAL BIOPSIES;  Surgeon: Collene Gobble, MD;  Location: [place];  Service: Pulmonary;;  BRONCHIAL BRUSHINGS  09/21/2021  Procedure: BRONCHIAL BRUSHINGS;  Surgeon: Collene Gobble, MD;  Location: [facility];  Service: Pulmonary;;  BRONCHIAL NEEDLE ASPIRATION BIOPSY  09/21/2021  Procedure: BRONCHIAL NEEDLE ASPIRATION BIOPSIES;  Surgeon: Collene Gobble, MD;  Location: [facility];  Service: Pulmonary;;  LUMBAR LAMINECTOMY N/Evalee Gerard 05/13/2018  Procedure: LEFT LUMBAR FOUR-FIVE MICRODISCECTOMY;  Surgeon: Marybelle Killings, MD;  Location: [facility];  Service: Orthopedics;  Laterality: N/Haile Bosler;  TUBAL LIGATION    TUBAL LIGATION  1998  VIDEO BRONCHOSCOPY WITH ENDOBRONCHIAL ULTRASOUND N/Governor Matos 09/21/2021  Procedure: VIDEO BRONCHOSCOPY WITH ENDOBRONCHIAL ULTRASOUND;  Surgeon: Collene Gobble, MD;  Location: [facility];  Service: Pulmonary;  Laterality: N/Saniya Tranchina; HPI: Pt is Genavive Kubicki 51 yo female adm to [facility] on 1/21 with some increase in dyspnea.  Per medical notes pt has lung cancer undergoing chemoradiation. "She was noted to have vomited w/ carafate and lidocaine viscous; 1/20  w/ cc: shortness of breath and chest pain. Completed chemo about 8d prior w/ post-chemo course c/b nausea, vomiting and poor po intake.1/22 diet advanced to full liq. Got another unit PLTs. Had some  hemoptysis w/ cough 1/23 new fever,  rapid response called for increased WOB and tachycardia. Pt anxious. PCXR w/ possible LL airspace disease. 1/24 remained tachycardic, still having CP and shoulder pain, still having nausea but no emesis. Developed progressive respiratory distress w/ loud audible expiratory wheezing. Pulse oximetry did not drop significantly however O2 was rapidly titrated up w/ little improvement in WOB. PCCM called emergently to bedside. Marked Upper airway expiratory wheezing noted w/ significant reduction during phonation efforts and when reassured." Swallow eval ordered.  Pt admits to "choking" on liquids since her cancer diagnosis in October 2022,  Subjective: pleasant, but this morning started having increasing difficulty with swallowing and currently unable to even swallow water  Recommendations for follow up therapy are one component of Keanen Dohse multi-disciplinary discharge planning process, led by the attending physician.  Recommendations may be updated based on patient status, additional functional criteria and insurance authorization. Assessment / Plan / Recommendation Clinical Impressions 12/24/2021 Clinical Impression Patient presents with Kreg Earhart normal oral phase of swallow, mild pharyngeal phase dysphagia and Krew Hortman severe esophageal dysphagia. First bolus tested was small cup sip of thin liquid barium which resulted in mild swallow initiation delay to vallecular sinus but no penetration or aspiration. Second bolus tested was 1/2 spoonful of puree texture. During pharyngeal transit of puree, thin liquid barium could be seen refluxing up into pharynx. After initial swallow, puree and likely thin liquid barium mix almost immediately refluxed back up into pharynx. Patient was able regurgitate barium by 'hocking' it up after it had moved into vallecular sinus. Esophageal sweep revealed stasis of barium in esophagus just below UES. Barium continued to reflux from below UES and into pharynx. SLP concluded  test at this time and patient was able to continue to regurgitate barium and expectorate into emesis bag. SLP informed MD of results. At this time, although patient did not exhibit any aspiration or penetration of thin liquids or puree solids, her esophageal phase of swallow is severely impaired. SLP recommending NPO except for ice chips, water or other liquid sips for pleasure. Patient is understanding and at this time, PO's are not giving her any pleasure. SLP to continue to follow patient with plan to consult more with MD regarding POC moving foward. SLP Visit Diagnosis Dysphagia, pharyngoesophageal phase (R13.14) Attention and concentration deficit following -- Frontal lobe and executive function deficit following -- Impact on safety and function --   Treatment Recommendations 12/24/2021 Treatment Recommendations Therapy as outlined in treatment plan below   Prognosis 12/24/2021 Prognosis for Safe Diet Advancement Guarded Barriers to Reach Goals Severity of deficits Barriers/Prognosis Comment -- Diet Recommendations 12/24/2021 SLP Diet Recommendations NPO;Ice chips PRN after oral care;Other (Comment) Liquid Administration via Cup Medication Administration Via alternative means Compensations Small sips/bites Postural Changes --   Other Recommendations 12/24/2021 Recommended Consults -- Oral Care Recommendations Oral care BID;Patient independent with oral care Other Recommendations -- Follow Up Recommendations Other (comment) Assistance recommended at discharge None Functional Status Assessment Patient has had Tavarius Grewe recent decline in their functional status and demonstrates the ability to make significant improvements in function in June Rode reasonable and predictable amount of time. Frequency and Duration  12/24/2021 Speech Therapy Frequency (ACUTE ONLY) min 2x/week Treatment Duration 1 week   Oral Phase 12/24/2021 Oral Phase WFL Oral - Pudding Teaspoon -- Oral - Pudding Cup -- Oral - Honey Teaspoon -- Oral - Honey Cup -- Oral -  Nectar Teaspoon -- Oral - Nectar Cup -- Oral - Nectar Straw -- Oral - Thin Teaspoon -- Oral - Thin Cup -- Oral -  Thin Straw -- Oral - Puree -- Oral - Mech Soft -- Oral - Regular -- Oral - Multi-Consistency -- Oral - Pill -- Oral Phase - Comment --  Pharyngeal Phase 12/24/2021 Pharyngeal Phase Impaired Pharyngeal- Pudding Teaspoon -- Pharyngeal -- Pharyngeal- Pudding Cup -- Pharyngeal -- Pharyngeal- Honey Teaspoon -- Pharyngeal -- Pharyngeal- Honey Cup -- Pharyngeal -- Pharyngeal- Nectar Teaspoon -- Pharyngeal -- Pharyngeal- Nectar Cup -- Pharyngeal -- Pharyngeal- Nectar Straw -- Pharyngeal -- Pharyngeal- Thin Teaspoon -- Pharyngeal -- Pharyngeal- Thin Cup Delayed swallow initiation-vallecula Pharyngeal -- Pharyngeal- Thin Straw -- Pharyngeal -- Pharyngeal- Puree WFL Pharyngeal -- Pharyngeal- Mechanical Soft -- Pharyngeal -- Pharyngeal- Regular -- Pharyngeal -- Pharyngeal- Multi-consistency -- Pharyngeal -- Pharyngeal- Pill -- Pharyngeal -- Pharyngeal Comment --  Cervical Esophageal Phase  12/24/2021 Cervical Esophageal Phase Impaired Pudding Teaspoon -- Pudding Cup -- Honey Teaspoon -- Honey Cup -- Nectar Teaspoon -- Nectar Cup -- Nectar Straw -- Thin Teaspoon -- Thin Cup Esophageal backflow into the pharynx Thin Straw -- Puree Esophageal backflow into the pharynx Mechanical Soft -- Regular -- Multi-consistency -- Pill -- Cervical Esophageal Comment -- Sonia Baller, MA, CCC-SLP Speech Therapy                     DG ESOPHAGUS W SINGLE CM (SOL OR THIN BA)  Result Date: 12/24/2021 CLINICAL DATA:  Abnormal esophagus on swallowing evaluation. EXAM: ESOPHOGRAM/BARIUM SWALLOW TECHNIQUE: Single contrast examination was performed using  thin barium. FLUOROSCOPY TIME:  Fluoroscopy Time:  24 seconds Radiation Exposure Index (if provided by the fluoroscopic device): 2.4 mGy Number of Acquired Spot Images: 0 COMPARISON:  CT of the chest of December 20, 2021. FINDINGS: Only Daimon Kean very limited esophagram could be performed. Annelies Coyt  small amount of barium contrast was swallowed which outlined debris within the esophagus proximal to an area of narrowing. Abrupt narrowing of the esophagus in the proximal to mid esophageal level corresponding to thickening seen on prior CT imaging. The patient was unable to continue for Leightyn Cina complete esophageal evaluation due to this finding and vomited after swallowing only Reeda Soohoo small amount of contrast. 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 Endoscopic assessment is suggested for further evaluation/potential treatment and to exclude the less likely possibility of concomitant neoplasm. This exam was performed by Alfredia Ferguson, PA, and was supervised and interpreted by Zetta Bills Electronically Signed   By: Zetta Bills M.D.   On: 12/24/2021 16:02        Scheduled Meds:  sodium chloride   Intravenous Once   chlorhexidine  15 mL Mouth Rinse BID   Chlorhexidine Gluconate Cloth  6 each Topical Daily   feeding supplement  237 mL Oral BID BM   mouth rinse  15 mL Mouth Rinse q12n4p   methylPREDNISolone (SOLU-MEDROL) injection  40 mg Intravenous Daily   metoprolol tartrate  12.5 mg Oral BID   multivitamin with minerals  1 tablet Oral Daily   nicotine  21 mg Transdermal Daily   oxyCODONE  20 mg Oral Q12H   pantoprazole (PROTONIX) IV  40 mg Intravenous Q12H   sodium chloride flush  10-40 mL Intracatheter Q12H   sucralfate  1 g Oral TID WC & HS   traZODone  50 mg Oral QHS   Continuous Infusions:  sodium chloride Stopped (12/19/21 1435)   famotidine (  PEPCID) IV Stopped (12/25/21 1238)   heparin 1,550 Units/hr (12/25/21 1530)   promethazine (PHENERGAN) injection (IM or IVPB) Stopped (12/21/21 1155)     LOS: 11 days    Time spent: over 42 min    Fayrene Helper, MD [facility]   To contact the attending provider between 7A-7P or the covering provider during after hours 7P-7A, please log into the web site [email or web address] and access using universal [place] password for that web site. If you do not have the password, please call the hospital operator.  12/25/2021, 4:44 PM

## 2021-12-25 NOTE — Progress Notes (Addendum)
ANTICOAGULATION CONSULT NOTE - follow up  Pharmacy Consult for Heparin Indication: DVT  Allergies  Allergen Reactions   Naproxen Nausea Only   Bactrim [Sulfamethoxazole-Trimethoprim] Nausea Only    Patient Measurements: Height: 5\' 8"  (172.7 cm) Weight: 68.8 kg (151 lb 10.8 oz) IBW/kg (Calculated) : 63.9 Heparin Dosing Weight: TBW  Vital Signs: Temp: 98.2 F (36.8 C) (01/31 0400) Temp Source: Oral (01/31 0400) BP: 122/78 (01/31 0515) Pulse Rate: 83 (01/31 0515)  Labs: Recent Labs    12/23/21 0449 12/23/21 0817 12/23/21 1643 12/24/21 0406 12/25/21 0500  HGB 8.4*  --  9.6* 9.2* 9.7*  HCT 25.5*  --  28.5* 28.3* 28.5*  PLT 39*  --  54* 52* 58*  HEPARINUNFRC  --  0.50  --  0.31 0.55  CREATININE 0.90  --   --  0.92 0.92     Estimated Creatinine Clearance: 73.8 mL/min (by C-G formula based on SCr of 0.92 mg/dL).   Medical History: Past Medical History:  Diagnosis Date   Ankle sprain    left   Anxiety    treated for panic attacks in the past.   Asthma    COPD (chronic obstructive pulmonary disease) ([place])    Depression    History of radiation therapy    Right lung- 09/26/21-11/13/21- Dr. Gery Pray   Irregular heart rate    Lung cancer [facility])    Renal disorder    kidney infections    Medications:  Infusions:   sodium chloride Stopped (12/19/21 1435)   famotidine (PEPCID) IV Stopped (12/24/21 1535)   heparin 1,550 Units/hr (12/25/21 0557)   promethazine (PHENERGAN) injection (IM or IVPB) Stopped (12/21/21 1155)    Assessment: 64 yoF admtted on 1/20 with N/V, respiratory failure.  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  Pharmacy is now consulted to dose Heparin  for acute upper extremity DVT in the setting of PICC line.     No recent anticoagulation.  Granix given 1/21- 1/25.  CBC:  Hgb remains low at 7.8, Plt decreased to 41k  Heparin drip was stopped to discuss with oncology about continuing care with heparin in light of  platelets.   Oncology recommendation now is:  Target Goals ( plts) : hold for <25, aim for 0.3-0.5 less than 50, and standard dose above 50. No bolus at any point  Goal of Therapy:  Heparin level 0.3-0.5 units/ml Monitor platelets by anticoagulation protocol: Yes  Today, 12/25/21  HL 0.55 therapeutic on heparin 1550 units/hr Hgb is 9.7 plt 58 Per RN no complications of therapy noted  Plan:  Platelets < 25k: hold heparin Platelets 25k - 50k: target HL 0.3-0.5 units/mL Platelets > 50k: target HL 0.3-0.7 units/mL NO bolus at any point  Continue heparin drip at 1550 units/hr Check anti-Xa level Daily while on heparin. Check daily CBC - continue to monitor H&H and platelets f/up when to stop UFH 1/31 for EGD with dilation    Leone Haven, PharmD 12/25/2021, 6:02 AM

## 2021-12-25 NOTE — Assessment & Plan Note (Addendum)
Barium swallow concerning for radiation related esophageal stricture Underwent upper GI endoscopy today, she was found to have benign stricture middle third of the esophagus.  No dilatation done.  Will start on clear liquid diet and advance to soft diet.  Patient will stay on mechanical soft diet. Changed to oral PPI.  Continue sucralfate.

## 2021-12-25 NOTE — Progress Notes (Signed)
[facility] Gastroenterology Progress Note  [name] 51 y.o. 10-28-1971  CC: Dysphagia, esophageal stricture   Subjective: Patient states she still having dysphagia to solids and liquids.  She is frustrated as she wants this fixed ASAP because she is very hungry.  Denies abdominal pain.  Denies nausea/vomiting.  ROS : Review of Systems  Gastrointestinal:  Negative for abdominal pain, blood in stool, constipation, diarrhea, heartburn, melena, nausea and vomiting.       Dysphagia  Genitourinary:  Negative for dysuria and urgency.     Objective: Vital signs in last 24 hours: Vitals:   12/25/21 0747 12/25/21 0800  BP: 102/82   Pulse: 99   Resp: 14   Temp:  98.2 F (36.8 C)  SpO2: 96%     Physical Exam:  General:  Alert, cooperative, no distress, appears stated age  Head:  Normocephalic, without obvious abnormality, atraumatic  Eyes:  Anicteric sclera, EOM's intact  Lungs:   Clear to auscultation bilaterally, respirations unlabored  Heart:  Regular rate and rhythm, S1, S2 normal  Abdomen:   Soft, non-tender, bowel sounds active all four quadrants,  no masses,     Lab Results: Recent Labs    12/24/21 0406 12/25/21 0500  NA 133* 137  K 3.8 3.4*  CL 100 99  CO2 24 30  GLUCOSE 86 82  BUN 27* 20  CREATININE 0.92 0.92  CALCIUM 9.0 9.1  MG 1.9 1.5*  PHOS 2.3* 4.6   Recent Labs    12/24/21 0406 12/25/21 0500  AST 10* 10*  ALT 17 16  ALKPHOS 60 59  BILITOT 0.7 0.7  PROT 6.1* 6.1*  ALBUMIN 2.8* 2.9*   Recent Labs    12/24/21 0406 12/25/21 0500  WBC 7.1 7.8  NEUTROABS 4.3 3.2  HGB 9.2* 9.7*  HCT 28.3* 28.5*  MCV 97.6 96.3  PLT 52* 58*   No results for input(s): LABPROT, INR in the last 72 hours.    Assessment Dysphagia: Radiation-induced esophageal stricture -Barium swallow 1/30: Abrupt narrowing of esophagus at the level of mid esophagus in the chest.  Most suggestive of radiation related esophageal stricture -BUN 20, creatinine 0.92  Pancytopenia  (improving) -WBC 7.8 -RBC 2.96 -Hemoglobin 9.7 -Platelets 58  Small cell lung cancer  COPD  Tobacco abuse  Plan: Plan for EGD tomorrow. I thoroughly discussed the procedures to include nature, alternatives, benefits, and risks including but not limited to bleeding, perforation, infection, anesthesia/cardiac and pulmonary complications. Patient provides understanding and gave verbal consent to proceed.   Continue Protonix 40mg  BID  Continue full liquid diet  NPO at midnight  Please stop heparin drip 4 hrs prior to procedure.  STOP heparin 2/1 at 0700  Continue supportive care as needed.   [facility] will follow.     Garnette Scheuermann PA-C 12/25/2021, 8:55 AM  Contact #  [phone]

## 2021-12-26 ENCOUNTER — Inpatient Hospital Stay (HOSPITAL_COMMUNITY): Payer: Medicaid Other | Admitting: Anesthesiology

## 2021-12-26 ENCOUNTER — Ambulatory Visit: Payer: Medicaid Other

## 2021-12-26 ENCOUNTER — Encounter (HOSPITAL_COMMUNITY): Payer: Self-pay | Admitting: Internal Medicine

## 2021-12-26 ENCOUNTER — Encounter (HOSPITAL_COMMUNITY)
Admission: EM | Disposition: A | Discharge status: Home / Self Care | Payer: Self-pay | Source: Home / Self Care | Attending: Family Medicine

## 2021-12-26 DIAGNOSIS — J690 Pneumonitis due to inhalation of food and vomit: Secondary | ICD-10-CM | POA: Diagnosis not present

## 2021-12-26 DIAGNOSIS — G893 Neoplasm related pain (acute) (chronic): Secondary | ICD-10-CM | POA: Diagnosis not present

## 2021-12-26 DIAGNOSIS — J9601 Acute respiratory failure with hypoxia: Secondary | ICD-10-CM | POA: Diagnosis not present

## 2021-12-26 DIAGNOSIS — R112 Nausea with vomiting, unspecified: Secondary | ICD-10-CM | POA: Diagnosis not present

## 2021-12-26 HISTORY — PX: FOREIGN BODY REMOVAL: SHX962

## 2021-12-26 HISTORY — PX: ESOPHAGOGASTRODUODENOSCOPY: SHX5428

## 2021-12-26 LAB — CBC WITH DIFFERENTIAL/PLATELET
Abs Immature Granulocytes: 1.58 10*3/uL — ABNORMAL HIGH (ref 0.00–0.07)
Basophils Absolute: 0.1 10*3/uL (ref 0.0–0.1)
Basophils Relative: 1 %
Eosinophils Absolute: 0 10*3/uL (ref 0.0–0.5)
Eosinophils Relative: 0 %
HCT: 28.5 % — ABNORMAL LOW (ref 36.0–46.0)
Hemoglobin: 9.4 g/dL — ABNORMAL LOW (ref 12.0–15.0)
Immature Granulocytes: 20 %
Lymphocytes Relative: 12 %
Lymphs Abs: 1 10*3/uL (ref 0.7–4.0)
MCH: 32 pg (ref 26.0–34.0)
MCHC: 33 g/dL (ref 30.0–36.0)
MCV: 96.9 fL (ref 80.0–100.0)
Monocytes Absolute: 1.7 10*3/uL — ABNORMAL HIGH (ref 0.1–1.0)
Monocytes Relative: 21 %
Neutro Abs: 3.6 10*3/uL (ref 1.7–7.7)
Neutrophils Relative %: 46 %
Platelets: 72 10*3/uL — ABNORMAL LOW (ref 150–400)
RBC: 2.94 MIL/uL — ABNORMAL LOW (ref 3.87–5.11)
RDW: 15.4 % (ref 11.5–15.5)
WBC: 7.9 10*3/uL (ref 4.0–10.5)
nRBC: 0 % (ref 0.0–0.2)

## 2021-12-26 LAB — COMPREHENSIVE METABOLIC PANEL
ALT: 15 U/L (ref 0–44)
AST: 10 U/L — ABNORMAL LOW (ref 15–41)
Albumin: 3.1 g/dL — ABNORMAL LOW (ref 3.5–5.0)
Alkaline Phosphatase: 58 U/L (ref 38–126)
Anion gap: 11 (ref 5–15)
BUN: 19 mg/dL (ref 6–20)
CO2: 26 mmol/L (ref 22–32)
Calcium: 9.7 mg/dL (ref 8.9–10.3)
Chloride: 100 mmol/L (ref 98–111)
Creatinine, Ser: 0.84 mg/dL (ref 0.44–1.00)
GFR, Estimated: >60 mL/min (ref >60)
Glucose, Bld: 84 mg/dL (ref 70–99)
Potassium: 3.8 mmol/L (ref 3.5–5.1)
Sodium: 137 mmol/L (ref 135–145)
Total Bilirubin: 0.4 mg/dL (ref 0.3–1.2)
Total Protein: 6.5 g/dL (ref 6.5–8.1)

## 2021-12-26 LAB — PHOSPHORUS: Phosphorus: 2.7 mg/dL (ref 2.5–4.6)

## 2021-12-26 LAB — MAGNESIUM: Magnesium: 1.8 mg/dL (ref 1.7–2.4)

## 2021-12-26 LAB — HEPARIN LEVEL (UNFRACTIONATED): Heparin Unfractionated: 0.53 IU/mL (ref 0.30–0.70)

## 2021-12-26 SURGERY — EGD (ESOPHAGOGASTRODUODENOSCOPY)
Anesthesia: Monitor Anesthesia Care

## 2021-12-26 MED ORDER — PROPOFOL 500 MG/50ML IV EMUL
INTRAVENOUS | Status: DC | PRN
  Administered 2021-12-26: 100 ug/kg/min via INTRAVENOUS

## 2021-12-26 MED ORDER — HEPARIN (PORCINE) 25000 UT/250ML-% IV SOLN
1550.0000 [IU]/h | INTRAVENOUS | Status: DC
  Filled 2021-12-26: qty 250

## 2021-12-26 MED ORDER — HYDROMORPHONE HCL 2 MG PO TABS
2.0000 mg | ORAL_TABLET | ORAL | Status: DC | PRN
  Administered 2021-12-26 – 2021-12-27 (×4): 2 mg via ORAL
  Filled 2021-12-26 (×4): qty 1

## 2021-12-26 MED ORDER — SODIUM CHLORIDE 0.9 % IV SOLN: INTRAVENOUS | Status: DC

## 2021-12-26 MED ORDER — LACTATED RINGERS IV SOLN
INTRAVENOUS | Status: AC | PRN
  Administered 2021-12-26: 1000 mL via INTRAVENOUS

## 2021-12-26 MED ORDER — PROPOFOL 10 MG/ML IV BOLUS
INTRAVENOUS | Status: DC | PRN
  Administered 2021-12-26: 30 mg via INTRAVENOUS
  Administered 2021-12-26: 100 mg via INTRAVENOUS
  Administered 2021-12-26: 30 mg via INTRAVENOUS

## 2021-12-26 MED ORDER — PHENYLEPHRINE HCL (PRESSORS) 10 MG/ML IV SOLN
INTRAVENOUS | Status: DC | PRN
  Administered 2021-12-26: 80 ug via INTRAVENOUS
  Administered 2021-12-26: 40 ug via INTRAVENOUS

## 2021-12-26 MED ORDER — APIXABAN 5 MG PO TABS
10.0000 mg | ORAL_TABLET | Freq: Two times a day (BID) | ORAL | Status: DC
  Administered 2021-12-26 – 2021-12-27 (×2): 10 mg via ORAL
  Filled 2021-12-26 (×2): qty 2

## 2021-12-26 MED ORDER — LIDOCAINE 2% (20 MG/ML) 5 ML SYRINGE
INTRAMUSCULAR | Status: DC | PRN
Start: 2021-12-26 — End: 2021-12-26
  Administered 2021-12-26: 60 mg via INTRAVENOUS

## 2021-12-26 MED ORDER — PANTOPRAZOLE SODIUM 40 MG PO TBEC
40.0000 mg | DELAYED_RELEASE_TABLET | Freq: Two times a day (BID) | ORAL | Status: DC
  Administered 2021-12-26 – 2021-12-27 (×2): 40 mg via ORAL
  Filled 2021-12-26 (×2): qty 1

## 2021-12-26 MED ORDER — PREDNISONE 20 MG PO TABS
30.0000 mg | ORAL_TABLET | Freq: Every day | ORAL | Status: DC
  Administered 2021-12-27: 30 mg via ORAL
  Filled 2021-12-26: qty 1

## 2021-12-26 MED ORDER — APIXABAN 5 MG PO TABS: 5.0000 mg | ORAL_TABLET | Freq: Two times a day (BID) | ORAL | Status: DC

## 2021-12-26 NOTE — Op Note (Signed)
[facility] Patient Name: [name] Procedure Date: 12/26/2021 MRN: [id number] Attending MD: Clarene Essex , MD Date of Birth: 13-Mar-1971 CSN: [id number] Age: 51 Admit Type: Inpatient Procedure:                Upper GI endoscopy Indications:              Dysphagia, Abnormal UGI series Providers:                Clarene Essex, MD, Carmie End, RN, [facility]                            Technician, Technician Referring MD:              Medicines:                Propofol total dose 500 mg IV, 100 mg IV lidocaine Complications:            No immediate complications. Estimated Blood Loss:     Estimated blood loss: none. Procedure:                Pre-Anesthesia Assessment:                           - Prior to the procedure, a History and Physical                            was performed, and patient medications and                            allergies were reviewed. The patient's tolerance of                            previous anesthesia was also reviewed. The risks                            and benefits of the procedure and the sedation                            options and risks were discussed with the patient.                            All questions were answered, and informed consent                            was obtained. Prior Anticoagulants: The patient has                            taken no previous anticoagulant or antiplatelet                            agents. ASA Grade Assessment: III - A patient with                            severe systemic disease. After reviewing the risks  and benefits, the patient was deemed in                            satisfactory condition to undergo the procedure.                           After obtaining informed consent, the endoscope was                            passed under direct vision. Throughout the                            procedure, the patient's blood pressure, pulse, and                             oxygen saturations were monitored continuously. The                            [id number]-[id number] ([id number]) Olympus endoscope was introduced                            through the mouth, and advanced to the second part                            of duodenum. The upper GI endoscopy was somewhat                            difficult due to presence of food. The patient                            tolerated the procedure well. Scope In: Scope Out: Findings:      The larynx was normal.      The proximal esophagus and distal esophagus were normal.      A small hiatal hernia was present.      One benign-appearing, intrinsic mild stenosis was found. The stenosis       was traversed.      Food was found in the middle third of the esophagus. Removal of food was       accomplished using both the tripod grabber as well as the Jabier Mutton net and       multiple insertions and withdrawals was done and we raise the head of       her bed and wash the good bit of food into the stomach and once the food       was removed the scope passed readily through the presumed radiation       strictured which seem to be fairly patent and we elected not to dilate       at this time due to friability and seemingly open.      The entire examined stomach was normal.      The duodenal bulb, first portion of the duodenum and second portion of       the duodenum were normal.      The exam was otherwise without abnormality. Impression:               - Normal larynx.                           -  Normal proximal esophagus and distal esophagus.                           - Small hiatal hernia.                           - Benign-appearing esophageal stenosis.                           - Food in the middle third of the esophagus.                            Removal was successful.                           - Normal stomach.                           - Normal duodenal bulb, first portion of the                            duodenum and  second portion of the duodenum.                           - The examination was otherwise normal. Moderate Sedation:      Not Applicable - Patient had care per Anesthesia. Recommendation:           - Clear liquid diet today.                           - Continue present medications. Resume heparin in 4                            hours if doing well                           - Return to GI clinic PRN.                           - Telephone GI clinic if symptomatic PRN.                           - Repeat upper endoscopy As needed. Particularly if                            dysphagia continues happy to reevaluate and                            consider dilation attempt again in the future Procedure Code(s):        --- Professional ---                           [phone], Esophagogastroduodenoscopy, flexible,                            transoral; with removal of foreign body(s) Diagnosis Code(s):        ---  Professional ---                           K44.9, Diaphragmatic hernia without obstruction or                            gangrene                           K22.2, Esophageal obstruction                           R97.588T, Food in esophagus causing other injury,                            initial encounter                           R13.10, Dysphagia, unspecified                           R93.3, Abnormal findings on diagnostic imaging of                            other parts of digestive tract CPT copyright 2019 [employer]. All rights reserved. The codes documented in this report are preliminary and upon coder review may  be revised to meet current compliance requirements. Clarene Essex, MD 12/26/2021 1:17:01 PM This report has been signed electronically. Number of Addenda: 0

## 2021-12-26 NOTE — Transfer of Care (Signed)
Immediate Anesthesia Transfer of Care Note  Patient: [name]  Procedure(s) Performed: ESOPHAGOGASTRODUODENOSCOPY (EGD)  Patient Location: PACU  Anesthesia Type:MAC  Level of Consciousness: awake, alert  and oriented  Airway & Oxygen Therapy: Patient Spontanous Breathing  Post-op Assessment: Report given to RN and Post -op Vital signs reviewed and stable  Post vital signs: Reviewed and stable  Last Vitals:  Vitals Value Taken Time  BP    Temp    Pulse    Resp    SpO2      Last Pain:  Vitals:   12/26/21 1058  TempSrc:   PainSc: 8       Patients Stated Pain Goal: 2 (11/26/70 5366)  Complications: No notable events documented.

## 2021-12-26 NOTE — Anesthesia Postprocedure Evaluation (Signed)
Anesthesia Post Note  Patient: [name]  Procedure(s) Performed: ESOPHAGOGASTRODUODENOSCOPY (EGD)     Patient location during evaluation: [place] [facility] Anesthesia Type: MAC and Regional Level of consciousness: oriented and awake and alert Pain management: pain level controlled Vital Signs Assessment: post-procedure vital signs reviewed and stable Respiratory status: spontaneous breathing and respiratory function stable Cardiovascular status: blood pressure returned to baseline and stable Postop Assessment: no headache, no backache, no apparent nausea or vomiting and able to ambulate Anesthetic complications: no   No notable events documented.  Last Vitals:  Vitals:   12/26/21 1340 12/26/21 1426  BP: 112/83 (!) 118/98  Pulse: (!) 115 (!) 109  Resp: (!) 22 18  Temp:  36.8 C  SpO2: 94% 96%    Last Pain:  Vitals:   12/26/21 1426  TempSrc: Oral  PainSc: 10-Worst pain ever                 Jahni Paul

## 2021-12-26 NOTE — Progress Notes (Signed)
ANTICOAGULATION CONSULT NOTE - follow up  Pharmacy Consult for Heparin Indication: DVT  Allergies  Allergen Reactions   Naproxen Nausea Only   Bactrim [Sulfamethoxazole-Trimethoprim] Nausea Only    Patient Measurements: Height: 5\' 8"  (172.7 cm) Weight: 68.8 kg (151 lb 10.8 oz) IBW/kg (Calculated) : 63.9 Heparin Dosing Weight: TBW  Vital Signs: Temp: 98.6 F (37 C) (02/01 0408) Temp Source: Oral (02/01 0408) BP: 121/74 (02/01 1058) Pulse Rate: 87 (02/01 1058)  Labs: Recent Labs    12/24/21 0406 12/25/21 0500 12/26/21 0345  HGB 9.2* 9.7* 9.4*  HCT 28.3* 28.5* 28.5*  PLT 52* 58* 72*  HEPARINUNFRC 0.31 0.55 0.53  CREATININE 0.92 0.92 0.84     Estimated Creatinine Clearance: 80.8 mL/min (by C-G formula based on SCr of 0.84 mg/dL).   Medications:  Infusions:   [MAR Hold] sodium chloride Stopped (12/19/21 1435)   sodium chloride     [MAR Hold] famotidine (PEPCID) IV Stopped (12/25/21 1238)   [MAR Hold] promethazine (PHENERGAN) injection (IM or IVPB) Stopped (12/21/21 1155)    Assessment: 81 yoF admtted on 1/20 with N/V, respiratory failure.  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  Pharmacy is now consulted to dose Heparin  for acute upper extremity DVT in the setting of PICC line.    Today, 12/26/21  HL remains therapeutic on heparin 1550 units/hr Hgb is 9.7 plt 58 Per RN no complications of therapy noted Heparin turned off ~0630 this AM in preparation for EGD Per GI, may resume heparin 4 hrs after procedure "if continues to do well" (procedure ended 1323 today)  Goal of Therapy:  See oncology recommendations Monitor platelets by anticoagulation protocol: Yes  Plan:   Oncology recommendations: Platelets < 25k: hold heparin Platelets 25k - 50k: target HL 0.3-0.5 units/mL Platelets > 50k: target HL 0.3-0.7 units/mL NO bolus at any point  At 1730, resume heparin drip at 1550 units/hr Daily heparin level and CBC Monitor for  any s/s bleeding or worsening thrombosis F/u for transition to long-term anticoagulant  Reuel Boom, PharmD, BCPS [phone] 12/26/2021, 1:27 PM

## 2021-12-26 NOTE — Progress Notes (Signed)
ANTICOAGULATION CONSULT NOTE - follow up  Pharmacy Consult for Heparin >> Eliquis Indication: DVT  Allergies  Allergen Reactions   Naproxen Nausea Only   Bactrim [Sulfamethoxazole-Trimethoprim] Nausea Only    Patient Measurements: Height: 5\' 8"  (172.7 cm) Weight: 68.8 kg (151 lb 10.8 oz) IBW/kg (Calculated) : 63.9 Heparin Dosing Weight: TBW  Vital Signs: Temp: 98.3 F (36.8 C) (02/01 1426) Temp Source: Oral (02/01 1426) BP: 118/98 (02/01 1426) Pulse Rate: 109 (02/01 1426)  Labs: Recent Labs    12/24/21 0406 12/25/21 0500 12/26/21 0345  HGB 9.2* 9.7* 9.4*  HCT 28.3* 28.5* 28.5*  PLT 52* 58* 72*  HEPARINUNFRC 0.31 0.Sheila 0.53  CREATININE 0.92 0.92 0.84     Estimated Creatinine Clearance: 80.8 mL/min (by C-G formula based on SCr of 0.84 mg/dL).   Medications:  Infusions:   sodium chloride Stopped (12/19/21 1435)   famotidine (PEPCID) IV Stopped (12/25/21 1238)   promethazine (PHENERGAN) injection (IM or IVPB) Stopped (12/21/21 1155)    Assessment: [name] admtted on 1/20 with N/V, respiratory failure.  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  Pharmacy is now consulted to dose Heparin  for acute upper extremity DVT in the setting of PICC line.    Today, 12/26/21 to start Eliquis today Heparin turned off ~0630 this AM in preparation for EGD Per GI, may resume heparin 4 hrs after procedure "if continues to do well" (procedure ended 1323 today) Per TRH, may start Eliquis today  Goal of Therapy:  Therapeutic anticoagulation Monitor platelets by anticoagulation protocol: Yes  Plan:   At 2200, start Eliquis 10mg  PO bid x 7 days, then 5mg  BID thereafter Will provide 30-day coupon voucher and education prior to discharge Monitor for any s/s bleeding or worsening thrombosis  Peggyann Juba, PharmD, BCPS [phone] 12/26/2021, 3:11 PM

## 2021-12-26 NOTE — Progress Notes (Signed)
[name] 12:18 PM  Subjective: Patient doing well in good spirits no new complaints we rediscussed her procedure  Objective: Vital signs stable afebrile no acute distress exam please see preassessment evaluation labs stable platelets improved  Assessment: Probable radiation stricture  Plan: Okay to proceed with endoscopy probable dilation with anesthesia assistance  [facility]  office [phone] After 5PM or if no answer call [phone]

## 2021-12-26 NOTE — Plan of Care (Signed)
  Problem: Activity: Goal: Risk for activity intolerance will decrease Outcome: Progressing   Problem: Coping: Goal: Level of anxiety will decrease Outcome: Progressing   Problem: Elimination: Goal: Will not experience complications related to bowel motility Outcome: Progressing   

## 2021-12-26 NOTE — Progress Notes (Signed)
Progress Note   Patient: [name] HCW:[id number] DOB: 1971-06-10 DOA: 12/14/2021     12 DOS: the patient was seen and examined on 12/26/2021   Brief hospital course: [name] is a 51 year old female with past medical history significant for small cell lung cancer with right hilar/suprahilar mass with occlusion of the right upper lobe bronchus right paratracheal/infrahilar/subcarinal lymphadenopathy diagnosed October 2022 currently on chemotherapy and radiation, COPD, tobacco use disorder who presented to [facility] ED on 1/20 with nausea/vomiting, shortness of breath, right-sided chest pain.  Currently on chemotherapy, last infusion on 12/06/2021.  Her nausea and vomiting has progressed and she has been unable to keep her medications down.  Patient denied fever, no chills, no headache, no diarrhea..  She was admitted with CT findings concerning for radiation esophagitis and developed CXR findings concerning for aspiration pneumonia.  Pulmonary was consulted due to progressive respiratory distress and thought her symptoms related to radiation induced upper airway inflammation and/or vocal cord dysfunction.  She's improved with steroids, antibiotics, and lasix from respiratory standpoint.  Being treated with PPI, H2 blocker, and sucralfate for her radiation esophagitis.  Barium swallow concerning for stricture, GI following.  Hospitalization complicated by pancytopenia, RUE DVT now on heparin.  See below for additional details       Assessment and Plan: Acute respiratory failure ([place])- (present on admission) CT 1/26 with circumferential wall thickening of RUL bronchus with intraluminal debris and subsequent bronchial narrowing, RLL bronchial thickening with intraluminal debris and adjacent peribronchovascular opacities in RLL - infectious vs inflammatory Seen by pulm, concern for element of vocal cord dysfunction or radiation induced upper airway inflammation Aggressive reflux treatment Reflux  precautions Oxygen or BiPAP as needed to keep saturations more than 90% and per respiratory distress. Patient completed 7 days of antibiotic therapy. Currently on trial of steroids, using IV steroids as she is n.p.o.  Will change to oral steroid once she is able to safely swallow. -We will change her tapering dose of prednisone after endoscopic procedure today. -We will change to oral Protonix after procedure today.   Esophageal stricture Barium swallow concerning for radiation related esophageal stricture Underwent upper GI endoscopy today, she was found to have benign stricture middle third of the esophagus.  No dilatation done.  Will start on clear liquid diet and advance to soft diet.  Patient will stay on mechanical soft diet. Changed to oral PPI.  Continue sucralfate.  Insomnia Trazodone, qtc appropriate  DVT of upper extremity (deep vein thrombosis) ([place]) PICC line related DVT. Discontinue PICC line.  Discontinue heparin. Eliquis for 3 months.  Platelets are more than 70,000. Oncology will schedule follow-up.  Tobacco abuse- (present on admission) Encouraged cessation.  Have nicotine patch.  Cancer related pain- (present on admission) Started on OxyContin 20 mg twice daily as a long-acting pain medicine. Patient prefers to use oral Dilaudid instead of oxycodone, will start patient on Dilaudid 2 mg every 4 hours as needed. She will need pain regimen on discharge.  Continue as needed IV Dilaudid.  Electrolyte abnormality Replace potassium, magnesium, phosphorus as needed  Radiation-induced esophagitis- (present on admission) PPI sucralfate  relfux precautions  Aspiration pneumonia ([place])- (present on admission) Completed cefepime.  All-time aspiration precautions.  Clinically improving.  Neutropenic fever ([place]) Last true fever 1/23 granix per oncology Neutropenia resolved   AKI (acute kidney injury) ([place]) resolved  Pancytopenia ([place])- (present on  admission) related to chemo Fluctuating and persistent thrombocytopenia and anemia WBC improved Improved after transfusion (pRBC's and platelets)  Hyponatremia- (present on admission) resolved  Chemotherapy induced nausea and vomiting- (present on admission) Continue antiemetics Advancing diet.  Goals of care, counseling/discussion Per prior notes, patient contemplating stopping chemo Appreciate palliative care assistance (1/27 note)  Malignant neoplasm of right upper lobe of lung ([place])- (present on admission) Small Cell Lung Cancer Follows with Dr. Julien Nordmann outpatient Last chemo 12/06/2021        Subjective: Patient seen and examined.  Came back from procedure.  She was so happy to help obstruction relieved. Patient tells me that her food was stuck in her food pipe since Monday and was not able to eat anything.  Currently has some soreness after procedure.  She has some right arm pain and diffuse chest pain but no other complaints.  On room air.  Walking around.  Eager to go home. Eager to eat.  Physical Exam: Vitals:   12/26/21 1335 12/26/21 1338 12/26/21 1340 12/26/21 1426  BP:  112/83 112/83 (!) 118/98  Pulse: (!) 117 (!) 124 (!) 115 (!) 109  Resp: (!) 23 17 (!) 22 18  Temp:    98.3 F (36.8 C)  TempSrc:    Oral  SpO2: 97% 100% 94% 96%  Weight:      Height:       General: Chronically sick looking and debilitated.  Older than stated age.  Not in any distress. Cardiovascular: S1-S2 normal.  Regular rate rhythm.   Respiratory: Bilateral clear.  No added sounds. Patient is on room air. Gastrointestinal: Soft.  Nontender.  Bowel sounds present. Ext: Right upper extremity with PICC line, no edema or swelling.  No erythema. Neuro: Alert oriented x4.  No focal neurological deficit. Musculoskeletal: No deformity or swelling.  Data Reviewed:  All lab test reviewed from today.  They are adequate.  Family Communication: None.  Disposition: Status is:  Inpatient Remains inpatient appropriate because: Oral challenge is needed.          Planned Discharge Destination: Home    Time spent: 35 minutes   Author: Barb Merino, MD 12/26/2021 3:06 PM  For on call review [email or web address].

## 2021-12-27 ENCOUNTER — Ambulatory Visit: Payer: Medicaid Other

## 2021-12-27 ENCOUNTER — Encounter (HOSPITAL_COMMUNITY): Payer: Self-pay | Admitting: Gastroenterology

## 2021-12-27 ENCOUNTER — Inpatient Hospital Stay (HOSPITAL_COMMUNITY): Payer: Medicaid Other

## 2021-12-27 DIAGNOSIS — I82621 Acute embolism and thrombosis of deep veins of right upper extremity: Secondary | ICD-10-CM

## 2021-12-27 DIAGNOSIS — K222 Esophageal obstruction: Secondary | ICD-10-CM

## 2021-12-27 HISTORY — PX: IR CHEST FLUORO: IMG2383

## 2021-12-27 LAB — CBC
HCT: 26 % — ABNORMAL LOW (ref 36.0–46.0)
Hemoglobin: 8.5 g/dL — ABNORMAL LOW (ref 12.0–15.0)
MCH: 32.1 pg (ref 26.0–34.0)
MCHC: 32.7 g/dL (ref 30.0–36.0)
MCV: 98.1 fL (ref 80.0–100.0)
Platelets: 80 K/uL — ABNORMAL LOW (ref 150–400)
RBC: 2.65 MIL/uL — ABNORMAL LOW (ref 3.87–5.11)
RDW: 15.9 % — ABNORMAL HIGH (ref 11.5–15.5)
WBC: 6.2 K/uL (ref 4.0–10.5)
nRBC: 0 % (ref 0.0–0.2)

## 2021-12-27 MED ORDER — NICOTINE 21 MG/24HR TD PT24: 21.0000 mg | MEDICATED_PATCH | Freq: Every day | TRANSDERMAL | 0 refills | Status: DC

## 2021-12-27 MED ORDER — LIDOCAINE HCL 1 % IJ SOLN
INTRAMUSCULAR | Status: DC | PRN
  Administered 2021-12-27: 10 mL

## 2021-12-27 MED ORDER — HYDROMORPHONE HCL 2 MG PO TABS: 2.0000 mg | ORAL_TABLET | ORAL | 0 refills | Status: DC | PRN

## 2021-12-27 MED ORDER — SUCRALFATE 1 GM/10ML PO SUSP: 1.0000 g | Freq: Three times a day (TID) | ORAL | 0 refills | Status: DC

## 2021-12-27 MED ORDER — XTAMPZA ER 18 MG PO C12A: 18.0000 mg | EXTENDED_RELEASE_CAPSULE | Freq: Two times a day (BID) | ORAL | 0 refills | Status: AC

## 2021-12-27 MED ORDER — ALPRAZOLAM 0.5 MG PO TABS
0.5000 mg | ORAL_TABLET | Freq: Once | ORAL | Status: AC
  Administered 2021-12-27: 0.5 mg via ORAL
  Filled 2021-12-27: qty 1

## 2021-12-27 MED ORDER — PANTOPRAZOLE SODIUM 40 MG PO TBEC: 40.0000 mg | DELAYED_RELEASE_TABLET | Freq: Two times a day (BID) | ORAL | 0 refills | Status: DC

## 2021-12-27 MED ORDER — APIXABAN (ELIQUIS) VTE STARTER PACK (10MG AND 5MG): ORAL_TABLET | ORAL | 0 refills | Status: DC

## 2021-12-27 MED ORDER — PREDNISONE 10 MG PO TABS: ORAL_TABLET | ORAL | 0 refills | Status: DC

## 2021-12-27 NOTE — TOC Transition Note (Signed)
Transition of Care [facility]) - CM/SW Discharge Note   Patient Details  Name: ROSAURA BOLON MRN: [id number] Date of Birth: 12/01/1970  Transition of Care [facility]) CM/SW Contact:  Leeroy Cha, RN Phone Number: 12/27/2021, 10:23 AM   Clinical Narrative:    Dcd to home with self care.  No to0c needs present or ordered.   Final next level of care: Home/Self Care Barriers to Discharge: Barriers Resolved   Patient Goals and CMS Choice Patient states their goals for this hospitalization and ongoing recovery are:: Return home   Choice offered to / list presented to : NA  Discharge Placement                       Discharge Plan and Services In-house Referral: Clinical Social Work   Post Acute Care Choice: NA          DME Arranged: N/A DME Agency: NA                  Social Determinants of Health (SDOH) Interventions     Readmission Risk Interventions Readmission Risk Prevention Plan 12/17/2021  Transportation Screening Complete  HRI or [place] Complete  Social Work Consult for [place] Planning/Counseling Complete  Palliative Care Screening Not Applicable  Medication Review [employer]) Complete  Some recent data might be hidden

## 2021-12-27 NOTE — Progress Notes (Signed)
Chaplain was able to check in with [name] who is excited about going home.  [name] voiced that she is still standing firm on no more chemo right now but may revisit the conversation in the future.    Chaplain offered support and presence.    12/27/21 1100  Clinical Encounter Type  Visited With Patient  Visit Type Follow-up

## 2021-12-27 NOTE — Discharge Instructions (Addendum)
Information on my medicine - ELIQUIS (apixaban)  Why was Eliquis prescribed for you? Eliquis was prescribed to treat blood clots that may have been found in the veins of your legs (deep vein thrombosis) or in your lungs (pulmonary embolism) and to reduce the risk of them occurring again.  What do You need to know about Eliquis ? The starting dose is 10 mg (two 5 mg tablets) taken TWICE daily for the FIRST SEVEN (7) DAYS, then on (enter date)  01/02/22  the dose is reduced to ONE 5 mg tablet taken TWICE daily.  Eliquis may be taken with or without food.   Try to take the dose about the same time in the morning and in the evening. If you have difficulty swallowing the tablet whole please discuss with your pharmacist how to take the medication safely.  Take Eliquis exactly as prescribed and DO NOT stop taking Eliquis without talking to the doctor who prescribed the medication.  Stopping may increase your risk of developing a new blood clot.  Refill your prescription before you run out.  After discharge, you should have regular check-up appointments with your healthcare provider that is prescribing your Eliquis.    What do you do if you miss a dose? If a dose of ELIQUIS is not taken at the scheduled time, take it as soon as possible on the same day and twice-daily administration should be resumed. The dose should not be doubled to make up for a missed dose.  Important Safety Information A possible side effect of Eliquis is bleeding. You should call your healthcare provider right away if you experience any of the following: Bleeding from an injury or your nose that does not stop. Unusual colored urine (red or dark brown) or unusual colored stools (red or black). Unusual bruising for unknown reasons. A serious fall or if you hit your head (even if there is no bleeding).  Some medicines may interact with Eliquis and might increase your risk of bleeding or clotting while on Eliquis. To help  avoid this, consult your healthcare provider or pharmacist prior to using any new prescription or non-prescription medications, including herbals, vitamins, non-steroidal anti-inflammatory drugs (NSAIDs) and supplements.  This website has more information on Eliquis (apixaban): http://www.eliquis.com/eliquis/home

## 2021-12-27 NOTE — Progress Notes (Signed)
[name] 11:38 AM  Subjective: Patient doing well swallowing fine in good spirits ready to go home no obvious post endoscopy issues  Objective: Vital signs stable afebrile no acute distress patient looks well not examined today CBC improved  Assessment: Status post endoscopy and food impaction in a patient with some radiation damage  Plan: Happy to see back as needed she will call me as needed and follow-up endoscopy with or without dilation as needed and we again discussed taking small bites drinking plenty of liquids and eating slowly  [facility]  office [phone] After 5PM or if no answer call [phone]

## 2021-12-27 NOTE — Progress Notes (Incomplete)
Nutrition Follow-up  DOCUMENTATION CODES:   Not applicable  INTERVENTION:  ***   NUTRITION DIAGNOSIS:   Increased nutrient needs related to cancer and cancer related treatments as evidenced by estimated needs.  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  Currently on chemotherapy, last infusion on 12/06/2021.  Her nausea and vomiting has progressed and she has been unable to keep her medications down.  ***   NUTRITION - FOCUSED PHYSICAL EXAM:  {RD Focused Exam List:21252}  Diet Order:   Diet Order             Diet general           DIET DYS 3 Room service appropriate? Yes; Fluid consistency: Thin  Diet effective now                   EDUCATION NEEDS:   No education needs have been identified at this time  Skin:  Skin Assessment: Reviewed RN Assessment  Last BM:  1/22 per flow sheet documentation  Height:   Ht Readings from Last 1 Encounters:  12/26/21 5\' 8"  (1.727 m)    Weight:   Wt Readings from Last 1 Encounters:  12/26/21 68.8 kg    Ideal Body Weight:     BMI:  Body mass index is 23.06 kg/m.  Estimated Nutritional Needs:   Kcal:  2100-2300  Protein:  105-120g  Fluid:  2.1L/day    ***

## 2021-12-27 NOTE — Progress Notes (Signed)
Nutrition Follow-up  DOCUMENTATION CODES:   Not applicable  INTERVENTION:  - discussed appropriate foods, chewing thoroughly, and drinking liquids with all bites of food.   NUTRITION DIAGNOSIS:   Increased nutrient needs related to cancer and cancer related treatments as evidenced by estimated needs. 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  Currently on chemotherapy, last infusion on 12/06/2021.  Her nausea and vomiting has progressed and she has been unable to keep her medications down.  Patient sitting up in bed. Sister and daughter arrived at the end of visit. Patient reports eating breakfast this AM without difficulty. She is feeling very hungry and shares this is the first time she has had such a good appetite in ~3 months.   Patient had upper endoscopy on 2/1 which showed normal esophagus and small hiatal hernia. She did have food lodged in lower third of esophagus which was removed at that time. Patient shares that this has provided much relief to her and has made it easier and more comfortable to eat.  She has been educated on the importance of softer foods, chewing very thoroughly, and drinking liquids with each bite of food. She has mainly been drinking water and/or Coke.   Weight recorded on 1/21 and appears to have been copied forward to 2/1. No other weight recordings in the chart from this hospitalization. No edema present at this time.   Patient denies  nutrition-related questions or concerns.   Discharge order for d/c home entered; no d/c summary yet.    Labs reviewed. Medications reviewed; 20 mg IV pepci/day, 40 mg protonix BID, 30 mg deltasone/day, 1 g carafate TID.    Diet Order:   Diet Order             Diet general           DIET DYS 3 Room service appropriate? Yes; Fluid consistency: Thin  Diet effective now                   EDUCATION NEEDS:   No education needs have been identified at this time  Skin:  Skin Assessment: Reviewed RN Assessment  Last BM:  1/30  Height:   Ht Readings from Last 1 Encounters:  12/26/21 5\' 8"  (1.727 m)    Weight:   Wt Readings from Last 1 Encounters:  12/26/21 68.8 kg     BMI:  Body mass index is 23.06 kg/m.   Estimated Nutritional Needs:  Kcal:  2100-2300 Protein:  105-120g Fluid:  2.1L/day      Jarome Matin, MS, RD, LDN Inpatient Clinical Dietitian RD pager # available in [place]  After hours/weekend pager # available in [facility]

## 2021-12-27 NOTE — Progress Notes (Signed)
Patient and mother, [name], have been explained and taught discharge instructions. Patient has no further questions at this time. IV has been removed, and site is clean, dry and intact. Radiology removed PICC line. Site is clean, dry, and intact.  Layla Maw, RN

## 2021-12-27 NOTE — Progress Notes (Signed)
Consulted to remove PICC line. Unable to pull the catheter. Warm compresses applied, position arm by extending 90 degrees. MD made aware. RN medicated the patient to relax per MD order. Went back after 30 minutes, still hard to pull the catheter. Only 7cm came out. MD at bedside and tried to pull, catheter still wont come out. MD order chest xray. Will follow up.

## 2021-12-27 NOTE — Discharge Summary (Signed)
Physician Discharge Summary  CASTELLA LERNER VQQ:[id number] DOB: 17-Oct-1971 DOA: 12/14/2021  PCP: [name], PA-C  Admit date: 12/14/2021 Discharge date: 12/27/2021  Admitted From: Home Disposition: Home  Recommendations for Outpatient Follow-up:  Follow-up with oncology as planned.  Home Health: N/A Equipment/Devices: N/A  Discharge Condition: Stable CODE STATUS: Full code Diet recommendation: Soft diet, chopped meat.  Aspiration precautions.  Dysphagia precautions.  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  Last chemo received on 1/12.  She was not able to eat or keep any meal down.  In the emergency room there was evidence of radiation esophagitis, she developed chest x-ray findings concerning for aspiration pneumonia.  She had progressive respiratory distress and treated with steroids, antibiotics and Lasix.  Initially on oxygen and BiPAP and ultimately currently on room air.  Also developed dysphagia, underwent upper GI endoscopy and found to have benign stricture midesophagus, food impaction.  Improved overall.  Able to go home today.  Treated for multiple conditions as below.  Acute hypoxemic respiratory failure present on admission: Secondary to aspiration pneumonia/pneumonitis in a patient with lung cancer on chemotherapy. Currently respiratory symptoms improved.  On room air. Completed 7 days of IV antibiotic therapy. Treated with steroids, pulmonary recommended slow taper, will taper over next 6 days. Reflux precautions, PPI twice daily. Upper GI endoscopy with benign stricture middle third of the esophagus, currently tolerating soft meal.  Continue PPI and sucralfate.  DVT of the  right upper extremity with PICC line: Patient was found to have right upper extremity DVT with indwelling PICC line that was placed in the hospitalization.  Was treated with heparin.  Remove PICC line today.  Will treat with Eliquis for 3 months.  Cancer related pain: Patient does have chest pain and arm pain.  Seen by palliative.  Her pain is currently controlled on OxyContin 20 mg twice daily, Dilaudid 4 mg as needed every 6 hours for breakthrough pain.  Short course of pain medication therapy was prescribed.  Further medications will be prescribed from oncology office.  Small cell lung cancer/intolerance to chemoradiation/goal of care: Overall had complicated experience with chemoradiation.  She does not want to continue further chemo but wants to explore other options.  Currently stable. I discussed with oncology, she is scheduled to have follow-up and they will discuss further options. Symptomatic treatment, pain medications, nausea medications prescribed. Pancytopenia, improved.  Platelets are 80,000, adequate for anticoagulation.  Stable to go home today with outpatient follow-up.   Discharge Diagnoses:  Principal Problem:   Acute respiratory failure ([place]) Active Problems:   Malignant neoplasm of right upper lobe of lung (HCC)   Goals of care, counseling/discussion   Chemotherapy induced nausea and vomiting   Hypomagnesemia   Hyponatremia   Leukopenia due to antineoplastic chemotherapy (HCC)   Pancytopenia (HCC)   Neutropenic fever (HCC)   Aspiration pneumonia (HCC)   Radiation-induced esophagitis   Electrolyte abnormality   Cancer related pain   Tobacco abuse   DVT of upper extremity (deep vein thrombosis) ([place])   Insomnia   Esophageal stricture    Discharge Instructions  Discharge Instructions     Call MD for:  difficulty breathing, headache or visual disturbances   Complete by: As directed    Diet general  Complete by: As directed    Soft food, chopped meat ,  sips of water with meals.   Increase activity slowly   Complete by: As directed       Allergies as of 12/27/2021       Reactions   Naproxen Nausea Only   Bactrim [sulfamethoxazole-trimethoprim] Nausea Only        Medication List     STOP taking these medications    oxyCODONE-acetaminophen 5-325 MG tablet Commonly known as: PERCOCET/ROXICET   potassium chloride 20 MEQ/15ML (10%) Soln       TAKE these medications    albuterol 108 (90 Base) MCG/ACT inhaler Commonly known as: VENTOLIN HFA Inhale 2 puffs into the lungs every 6 (six) hours as needed for wheezing or shortness of breath.   Apixaban Starter Pack (10mg  and 5mg ) Commonly known as: ELIQUIS STARTER PACK Take as directed on package: start with two-5mg  tablets twice daily for 7 days. On day 8, switch to one-5mg  tablet twice daily.   diphenhydramine-acetaminophen 25-500 MG Tabs tablet Commonly known as: TYLENOL PM Take 3 tablets by mouth at bedtime as needed (sleep).   HYDROmorphone 2 MG tablet Commonly known as: DILAUDID Take 1 tablet (2 mg total) by mouth every 4 (four) hours as needed for severe pain.   magnesium oxide 400 (240 Mg) MG tablet Commonly known as: MAG-OX Take 1 tablet (400 mg total) by mouth 3 (three) times daily.   nicotine 21 mg/24hr patch Commonly known as: NICODERM CQ - dosed in mg/24 hours Place 1 patch (21 mg total) onto the skin daily. Start taking on: December 28, 2021   ondansetron 8 MG disintegrating tablet Commonly known as: Zofran ODT Dissolve 1 tablet (8 mg total) by mouth every 8 (eight) hours as needed for nausea or vomiting.   pantoprazole 40 MG tablet Commonly known as: PROTONIX Take 1 tablet (40 mg total) by mouth 2 (two) times daily.   predniSONE 10 MG tablet Commonly known as: DELTASONE 3 tabs daily for 2 days 2 tabs daily for 2 days 1 tab daily for 2 days   sucralfate 1 GM/10ML suspension Commonly known as: CARAFATE Take 10 mLs (1 g total) by mouth 4 (four) times  daily -  with meals and at bedtime.   Xtampza ER 18 MG C12a Generic drug: oxyCODONE ER Take 18 mg by mouth 2 (two) times daily for 7 days.        Allergies  Allergen Reactions   Naproxen Nausea Only   Bactrim [Sulfamethoxazole-Trimethoprim] Nausea Only    Consultations: Oncology Pulmonary Gastroenterology   Procedures/Studies: DG Chest 1 View  Result Date: 12/18/2021 CLINICAL DATA:  Increased shortness of breath EXAM: CHEST  1 VIEW COMPARISON:  12/18/2021 FINDINGS: The heart size and mediastinal contours are within normal limits. Both lungs are clear. The visualized skeletal structures are unremarkable. IMPRESSION: No active disease. Electronically Signed   By: Kathreen Devoid M.D.   On: 12/18/2021 10:08   DG Chest 2 View  Result Date: 11/28/2021 CLINICAL DATA:  Cough, neutropenia and chills. EXAM: CHEST - 2 VIEW COMPARISON:  09/21/2021 FINDINGS: Normal heart size. No signs of pleural effusion or edema. Significant reduction and previously noted perihilar right upper lobe lung mass consistent with known non-small cell lung cancer. No acute airspace consolidation or atelectasis. No acute osseous findings. Previously noted multifocal FDG avid bone metastases are better seen on prior PET-CT from 10/12/2021. IMPRESSION: 1. No acute cardiopulmonary abnormalities. 2. Significant reduction in right upper lobe lung mass consistent with  known non-small cell lung cancer. Electronically Signed   By: Kerby Moors M.D.   On: 11/28/2021 16:17   CT Angio Chest Pulmonary Embolism (PE) W or WO Contrast  Result Date: 12/20/2021 CLINICAL DATA:  Shortness of breath. Cough. Tachycardia. DVT. History of lung cancer. EXAM: CT ANGIOGRAPHY CHEST WITH CONTRAST TECHNIQUE: Multidetector CT imaging of the chest was performed using the standard protocol during bolus administration of intravenous contrast. Multiplanar CT image reconstructions and MIPs were obtained to evaluate the vascular anatomy. RADIATION DOSE  REDUCTION: This exam was performed according to the departmental dose-optimization program which includes automated exposure control, adjustment of the mA and/or kV according to patient size and/or use of iterative reconstruction technique. CONTRAST:  21mL OMNIPAQUE IOHEXOL 350 MG/ML SOLN COMPARISON:  Radiograph yesterday. Chest CTA 6 days ago 12/24/2021, PET CT 10/12/2021 reviewed FINDINGS: Cardiovascular: Examination is diagnostic to the distal segmental level. No central pulmonary embolus. The subsegmental branches are not well assessed due to contrast bolus timing. The heart is normal in size. No pericardial effusion. Aortic atherosclerosis without dissection or acute aortic findings. Right upper extremity PICC tip in the lower SVC. There is contrast refluxing into the hepatic veins and IVC. Mediastinum/Nodes: Anterior paratracheal nodes measure up to 8 mm, not significantly changed from prior. There is a 10 mm right hilar lymph node. Circumferential wall thickening of the mid and upper esophagus which is mildly improved. The distal esophagus is patulous. Lungs/Pleura: Emphysema. Right suprahilar nodule measures 18 mm, series 6, image 60. There is circumferential wall thickening of the right upper lobe bronchus with intraluminal debris and subsequent bronchial narrowing. Well demarcated subpleural opacity in the right upper lobe, series 6, image 30, 15 mm and not significantly changed from prior exam and prior PET. Left upper lobe 5 mm nodule series 6, image 37, unchanged. There is debris within the right lower lobe bronchus with bronchial thickening and adjacent peribronchovascular opacities in the right lower lobe, for example series 6, image 82. Basilar assessment is limited by motion. No significant pleural effusion. Upper Abdomen: No acute findings. Musculoskeletal: Healing right anterior seventh and eighth rib fractures are unchanged. No new osseous findings. Review of the MIP images confirms the above  findings. IMPRESSION: 1. No central pulmonary embolus. 2. There is contrast refluxing into the hepatic veins and IVC suggestive elevated right heart pressures. 3. Right suprahilar nodule measuring 18 mm, consistent with known malignancy. 4. There is circumferential wall thickening of the right upper lobe bronchus with intraluminal debris and subsequent bronchial narrowing. Right lower lobe bronchial thickening with intraluminal debris and adjacent peribronchovascular opacities in the right lower lobe, likely infectious or inflammatory. Given prominent esophageal thickening, the possibility of aspiration is raised. 5. Unchanged tiny left upper lobe nodule. Unchanged subpleural opacity at the right lung apex. 6. Emphysema. Aortic Atherosclerosis (ICD10-I70.0) and Emphysema (ICD10-J43.9). Electronically Signed   By: Keith Rake M.D.   On: 12/20/2021 17:54   CT Angio Chest PE W/Cm &/Or Wo Cm  Result Date: 12/14/2021 CLINICAL DATA:  Right chest pain, tachycardia. Lung cancer. Shortness of breath. EXAM: CT ANGIOGRAPHY CHEST WITH CONTRAST TECHNIQUE: Multidetector CT imaging of the chest was performed using the standard protocol during bolus administration of intravenous contrast. Multiplanar CT image reconstructions and MIPs were obtained to evaluate the vascular anatomy. RADIATION DOSE REDUCTION: This exam was performed according to the departmental dose-optimization program which includes automated exposure control, adjustment of the mA and/or kV according to patient size and/or use of iterative reconstruction technique. CONTRAST:  62mL  OMNIPAQUE IOHEXOL 350 MG/ML SOLN COMPARISON:  Chest CT 09/15/2021.  PET CT 10/12/2021. FINDINGS: Cardiovascular: No filling defects in the pulmonary arteries to suggest pulmonary emboli. Heart is normal size. Aorta is normal caliber. Mediastinum/Nodes: Esophagus appears thick walled in the upper to midthoracic region. Previously seen right paratracheal adenopathy in the area much  improved. No measurable enlarged mediastinal, hilar or axillary adenopathy. Lungs/Pleura: Previously seen right hilar mass improved, measuring 1.5 cm compared to 2 cm on prior PET CT and 6 cm on prior chest CT. Mild nodular airspace disease in the right upper lobe in the area of dense consolidation previously. Scarring in the right apex. Mild emphysema. Left upper lobe nodule posteriorly on image 40 of series 10 measures 5 mm, stable. No new or enlarging pulmonary nodules. No effusions. Upper Abdomen: Imaging into the upper abdomen demonstrates no acute findings. Musculoskeletal: Probable healing pathologic fractures in the right anterior 7th and 8th ribs are again seen, unchanged. No acute fracture. Review of the MIP images confirms the above findings. IMPRESSION: No evidence of pulmonary embolus. Improving mediastinal and right hilar mass/adenopathy. Thickened upper to midthoracic esophagus. 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 Aortic Atherosclerosis (ICD10-I70.0) and Emphysema (ICD10-J43.9). Electronically Signed   By: Rolm Baptise M.D.   On: 12/14/2021 18:36   DG CHEST PORT 1 VIEW  Result Date: 12/19/2021 CLINICAL DATA:  Pneumonia EXAM: PORTABLE CHEST 1 VIEW COMPARISON:  Chest radiograph 12/19/2021 FINDINGS: There is a new right upper extremity PICC terminating at the cavoatrial junction. The cardiomediastinal silhouette is stable. There are patchy opacities in the medial right base, slightly increased from prior. There is no other focal airspace disease. There is no significant pleural effusion. There is no pneumothorax. There is no acute osseous abnormality. IMPRESSION: 1. New right upper extremity PICC with tip at the region of the cavoatrial junction. 2. Patchy opacities in the medial right base are slightly increased from prior and could reflect developing infection. Electronically Signed   By: Valetta Mole M.D.   On:  12/19/2021 08:04   DG CHEST PORT 1 VIEW  Result Date: 12/18/2021 CLINICAL DATA:  Shortness of breath EXAM: PORTABLE CHEST 1 VIEW COMPARISON:  12/17/2021 FINDINGS: Lungs are hyperinflated as can be seen with COPD. Hazy left lower lobe airspace disease which may reflect atelectasis versus pneumonia unchanged compared with the prior exam. No pleural effusion or pneumothorax. Heart and mediastinal contours are unremarkable. No acute osseous abnormality. IMPRESSION: Stable hazy left lower lobe airspace disease which may reflect atelectasis versus pneumonia. Electronically Signed   By: Kathreen Devoid M.D.   On: 12/18/2021 08:40   DG CHEST PORT 1 VIEW  Result Date: 12/17/2021 CLINICAL DATA:  Nausea and shortness of breath. EXAM: PORTABLE CHEST 1 VIEW COMPARISON:  Radiographs and chest CTA 3 days ago 12/14/2021, FINDINGS: Developing patchy opacity at the left lung base, new from prior exam. There is increase some bronchial thickening. Normal heart size with unchanged mediastinal contours. No significant pleural effusion or pneumothorax. IMPRESSION: 1. Developing patchy opacity at the left lung base over the last 3 days may be atelectasis or pneumonia. 2. Increased bronchial thickening. Electronically Signed   By: Keith Rake M.D.   On: 12/17/2021 17:35   DG Chest Port 1 View  Result Date: 12/14/2021 CLINICAL DATA:  Chest pain short of breath EXAM: PORTABLE CHEST 1 VIEW COMPARISON:  11/28/2021, CT 09/15/2021 FINDINGS: No acute airspace disease or effusion. Stable cardiomediastinal silhouette.  Similar right suprahilar opacity corresponding to known history of lung cancer. No pneumothorax IMPRESSION: No active disease. Similar small right suprahilar opacity, corresponding to history of lung cancer. Electronically Signed   By: Donavan Foil M.D.   On: 12/14/2021 16:41   DG Swallowing Func-Speech Pathology  Result Date: 12/24/2021 Table formatting from the original result was not included. Objective Swallowing  Evaluation: Type of Study: MBS-Modified Barium Swallow Study  Patient Details Name: KELENA GARROW MRN: [id number] Date of Birth: January 01, 1971 Today's Date: 12/24/2021 Time: SLP Start Time (ACUTE ONLY): 1400 -SLP Stop Time (ACUTE ONLY): 1425 SLP Time Calculation (min) (ACUTE ONLY): 25 min Past Medical History: Past Medical History: Diagnosis Date  Ankle sprain   left  Anxiety   treated for panic attacks in the past.  Asthma   COPD (chronic obstructive pulmonary disease) (HCC)   Depression   History of radiation therapy   Right lung- 09/26/21-11/13/21- Dr. Gery Pray  Irregular heart rate   Lung cancer [facility])   Renal disorder   kidney infections Past Surgical History: Past Surgical History: Procedure Laterality Date  APPENDECTOMY    BRONCHIAL BIOPSY  09/21/2021  Procedure: BRONCHIAL BIOPSIES;  Surgeon: Collene Gobble, MD;  Location: [place];  Service: Pulmonary;;  BRONCHIAL BRUSHINGS  09/21/2021  Procedure: BRONCHIAL BRUSHINGS;  Surgeon: Collene Gobble, MD;  Location: [place];  Service: Pulmonary;;  BRONCHIAL NEEDLE ASPIRATION BIOPSY  09/21/2021  Procedure: BRONCHIAL NEEDLE ASPIRATION BIOPSIES;  Surgeon: Collene Gobble, MD;  Location: [facility];  Service: Pulmonary;;  LUMBAR LAMINECTOMY N/A 05/13/2018  Procedure: LEFT LUMBAR FOUR-FIVE MICRODISCECTOMY;  Surgeon: Marybelle Killings, MD;  Location: [place];  Service: Orthopedics;  Laterality: N/A;  TUBAL LIGATION    TUBAL LIGATION  1998  VIDEO BRONCHOSCOPY WITH ENDOBRONCHIAL ULTRASOUND N/A 09/21/2021  Procedure: VIDEO BRONCHOSCOPY WITH ENDOBRONCHIAL ULTRASOUND;  Surgeon: Collene Gobble, MD;  Location: [place] [facility];  Service: Pulmonary;  Laterality: N/A; HPI: Pt is a 50 yo female adm to [facility] on 1/21 with some increase in dyspnea.  Per medical notes pt has lung cancer undergoing chemoradiation. "She was noted to have vomited w/ carafate and lidocaine viscous; 1/20  w/ cc: shortness of breath and chest pain. Completed chemo about 8d prior w/ post-chemo course c/b  nausea, vomiting and poor po intake.1/22 diet advanced to full liq. Got another unit PLTs. Had some hemoptysis w/ cough 1/23 new fever, rapid response called for increased WOB and tachycardia. Pt anxious. PCXR w/ possible LL airspace disease. 1/24 remained tachycardic, still having CP and shoulder pain, still having nausea but no emesis. Developed progressive respiratory distress w/ loud audible expiratory wheezing. Pulse oximetry did not drop significantly however O2 was rapidly titrated up w/ little improvement in WOB. PCCM called emergently to bedside. Marked Upper airway expiratory wheezing noted w/ significant reduction during phonation efforts and when reassured." Swallow eval ordered.  Pt admits to "choking" on liquids since her cancer diagnosis in October 2022,  Subjective: pleasant, but this morning started having increasing difficulty with swallowing and currently unable to even swallow water  Recommendations for follow up therapy are one component of a multi-disciplinary discharge planning process, led by the attending physician.  Recommendations may be updated based on patient status, additional functional criteria and insurance authorization. Assessment / Plan / Recommendation Clinical Impressions 12/24/2021 Clinical Impression Patient presents with a normal oral phase of swallow, mild pharyngeal phase dysphagia and a severe esophageal dysphagia. First bolus tested was small cup sip of thin liquid barium which resulted in  mild swallow initiation delay to vallecular sinus but no penetration or aspiration. Second bolus tested was 1/2 spoonful of puree texture. During pharyngeal transit of puree, thin liquid barium could be seen refluxing up into pharynx. After initial swallow, puree and likely thin liquid barium mix almost immediately refluxed back up into pharynx. Patient was able regurgitate barium by 'hocking' it up after it had moved into vallecular sinus. Esophageal sweep revealed stasis of barium in  esophagus just below UES. Barium continued to reflux from below UES and into pharynx. SLP concluded test at this time and patient was able to continue to regurgitate barium and expectorate into emesis bag. SLP informed MD of results. At this time, although patient did not exhibit any aspiration or penetration of thin liquids or puree solids, her esophageal phase of swallow is severely impaired. SLP recommending NPO except for ice chips, water or other liquid sips for pleasure. Patient is understanding and at this time, PO's are not giving her any pleasure. SLP to continue to follow patient with plan to consult more with MD regarding POC moving foward. SLP Visit Diagnosis Dysphagia, pharyngoesophageal phase (R13.14) Attention and concentration deficit following -- Frontal lobe and executive function deficit following -- Impact on safety and function --   Treatment Recommendations 12/24/2021 Treatment Recommendations Therapy as outlined in treatment plan below   Prognosis 12/24/2021 Prognosis for Safe Diet Advancement Guarded Barriers to Reach Goals Severity of deficits Barriers/Prognosis Comment -- Diet Recommendations 12/24/2021 SLP Diet Recommendations NPO;Ice chips PRN after oral care;Other (Comment) Liquid Administration via Cup Medication Administration Via alternative means Compensations Small sips/bites Postural Changes --   Other Recommendations 12/24/2021 Recommended Consults -- Oral Care Recommendations Oral care BID;Patient independent with oral care Other Recommendations -- Follow Up Recommendations Other (comment) Assistance recommended at discharge None Functional Status Assessment Patient has had a recent decline in their functional status and demonstrates the ability to make significant improvements in function in a reasonable and predictable amount of time. Frequency and Duration  12/24/2021 Speech Therapy Frequency (ACUTE ONLY) min 2x/week Treatment Duration 1 week   Oral Phase 12/24/2021 Oral Phase WFL  Oral - Pudding Teaspoon -- Oral - Pudding Cup -- Oral - Honey Teaspoon -- Oral - Honey Cup -- Oral - Nectar Teaspoon -- Oral - Nectar Cup -- Oral - Nectar Straw -- Oral - Thin Teaspoon -- Oral - Thin Cup -- Oral - Thin Straw -- Oral - Puree -- Oral - Mech Soft -- Oral - Regular -- Oral - Multi-Consistency -- Oral - Pill -- Oral Phase - Comment --  Pharyngeal Phase 12/24/2021 Pharyngeal Phase Impaired Pharyngeal- Pudding Teaspoon -- Pharyngeal -- Pharyngeal- Pudding Cup -- Pharyngeal -- Pharyngeal- Honey Teaspoon -- Pharyngeal -- Pharyngeal- Honey Cup -- Pharyngeal -- Pharyngeal- Nectar Teaspoon -- Pharyngeal -- Pharyngeal- Nectar Cup -- Pharyngeal -- Pharyngeal- Nectar Straw -- Pharyngeal -- Pharyngeal- Thin Teaspoon -- Pharyngeal -- Pharyngeal- Thin Cup Delayed swallow initiation-vallecula Pharyngeal -- Pharyngeal- Thin Straw -- Pharyngeal -- Pharyngeal- Puree WFL Pharyngeal -- Pharyngeal- Mechanical Soft -- Pharyngeal -- Pharyngeal- Regular -- Pharyngeal -- Pharyngeal- Multi-consistency -- Pharyngeal -- Pharyngeal- Pill -- Pharyngeal -- Pharyngeal Comment --  Cervical Esophageal Phase  12/24/2021 Cervical Esophageal Phase Impaired Pudding Teaspoon -- Pudding Cup -- Honey Teaspoon -- Honey Cup -- Nectar Teaspoon -- Nectar Cup -- Nectar Straw -- Thin Teaspoon -- Thin Cup Esophageal backflow into the pharynx Thin Straw -- Puree Esophageal backflow into the pharynx Mechanical Soft -- Regular -- Multi-consistency -- Pill -- Cervical Esophageal Comment --  Sonia Baller, MA, CCC-SLP Speech Therapy                     ECHOCARDIOGRAM COMPLETE  Result Date: 12/20/2021    ECHOCARDIOGRAM REPORT   Patient Name:   NASHAE MAUDLIN Date of Exam: 12/20/2021 Medical Rec #:  [id number]      Height:       68.0 in Accession #:    [id number]     Weight:       151.7 lb Date of Birth:  1971-04-13      BSA:          1.817 m Patient Age:    51 years       BP:           143/94 mmHg Patient Gender: F              HR:           117 bpm. Exam  Location:  Inpatient Procedure: 2D Echo Indications:    Elevated brain natriuretic peptide level  History:        Patient has prior history of Echocardiogram examinations and                 Patient has no prior history of Echocardiogram examinations.                 COPD. Irregular heartrate.  Sonographer:    Arlyss Gandy Referring Phys: [place]  1. Left ventricular ejection fraction, by estimation, is 60 to 65%. The left ventricle has normal function. The left ventricle has no regional wall motion abnormalities. Left ventricular diastolic parameters were normal.  2. Right ventricular systolic function is normal. The right ventricular size is normal. Tricuspid regurgitation signal is inadequate for assessing PA pressure.  3. The pericardial effusion is anterior to the right ventricle.  4. The mitral valve is normal in structure. Trivial mitral valve regurgitation. No evidence of mitral stenosis.  5. The aortic valve is normal in structure. Aortic valve regurgitation is not visualized. No aortic stenosis is present.  6. The inferior vena cava is normal in size with <50% respiratory variability, suggesting right atrial pressure of 8 mmHg. FINDINGS  Left Ventricle: Left ventricular ejection fraction, by estimation, is 60 to 65%. The left ventricle has normal function. The left ventricle has no regional wall motion abnormalities. The left ventricular internal cavity size was normal in size. There is  no left ventricular hypertrophy. Left ventricular diastolic parameters were normal. Normal left ventricular filling pressure. Right Ventricle: The right ventricular size is normal. No increase in right ventricular wall thickness. Right ventricular systolic function is normal. Tricuspid regurgitation signal is inadequate for assessing PA pressure. Left Atrium: Left atrial size was normal in size. Right Atrium: Right atrial size was normal in size. Pericardium: Trivial pericardial effusion is  present. The pericardial effusion is anterior to the right ventricle. Mitral Valve: The mitral valve is normal in structure. Trivial mitral valve regurgitation. No evidence of mitral valve stenosis. Tricuspid Valve: The tricuspid valve is normal in structure. Tricuspid valve regurgitation is not demonstrated. No evidence of tricuspid stenosis. Aortic Valve: The aortic valve is normal in structure. Aortic valve regurgitation is not visualized. No aortic stenosis is present. Aortic valve mean gradient measures 3.0 mmHg. Aortic valve peak gradient measures 6.0 mmHg. Aortic valve area, by VTI measures 2.03 cm. Pulmonic Valve: The pulmonic valve was normal in structure. Pulmonic valve regurgitation is trivial.  No evidence of pulmonic stenosis. Aorta: The aortic root is normal in size and structure. Venous: The inferior vena cava is normal in size with less than 50% respiratory variability, suggesting right atrial pressure of 8 mmHg. IAS/Shunts: No atrial level shunt detected by color flow Doppler.  LEFT VENTRICLE PLAX 2D LVIDd:         4.40 cm   Diastology LVIDs:         3.60 cm   LV e' medial:    14.10 cm/s LV PW:         0.90 cm   LV E/e' medial:  8.5 LV IVS:        0.90 cm   LV e' lateral:   17.70 cm/s LVOT diam:     2.00 cm   LV E/e' lateral: 6.8 LV SV:         41 LV SV Index:   22 LVOT Area:     3.14 cm  RIGHT VENTRICLE             IVC RV Basal diam:  3.00 cm     IVC diam: 2.20 cm RV Mid diam:    3.60 cm RV S prime:     17.30 cm/s TAPSE (M-mode): 1.6 cm LEFT ATRIUM             Index        RIGHT ATRIUM          Index LA diam:        3.20 cm 1.76 cm/m   RA Area:     9.01 cm LA Vol (A2C):   33.7 ml 18.55 ml/m  RA Volume:   16.80 ml 9.25 ml/m LA Vol (A4C):   37.0 ml 20.36 ml/m LA Biplane Vol: 35.3 ml 19.43 ml/m  AORTIC VALVE AV Area (Vmax):    1.89 cm AV Area (Vmean):   1.84 cm AV Area (VTI):     2.03 cm AV Vmax:           122.00 cm/s AV Vmean:          86.000 cm/s AV VTI:            0.201 m AV Peak Grad:       6.0 mmHg AV Mean Grad:      3.0 mmHg LVOT Vmax:         73.50 cm/s LVOT Vmean:        50.500 cm/s LVOT VTI:          0.130 m LVOT/AV VTI ratio: 0.65  AORTA Ao Root diam: 3.20 cm Ao Asc diam:  3.20 cm MITRAL VALVE MV Area (PHT): 3.77 cm     SHUNTS MV Decel Time: 201 msec     Systemic VTI:  0.13 m MV E velocity: 120.00 cm/s  Systemic Diam: 2.00 cm Fransico Him MD Electronically signed by Fransico Him MD Signature Date/Time: 12/20/2021/2:34:21 PM    Final    VAS [place] UPPER EXTREMITY VENOUS DUPLEX  Result Date: 12/21/2021 UPPER VENOUS STUDY  Patient Name:  ARTEMIS KOLLER  Date of Exam:   12/20/2021 Medical Rec #: [id number]       Accession #:    [id number] Date of Birth: 1971/09/12       Patient Gender: F Patient Age:   62 years Exam Location:  [facility] Procedure:      VAS [place] UPPER EXTREMITY VENOUS DUPLEX Referring Phys: A POWELL JR --------------------------------------------------------------------------------  Indications: Edema Other Indications: PICC line in  RUE. Risk Factors: CA patient on chemotherapy. Comparison Study: No previous exams Performing Technologist: Jody Hill RVT, RDMS  Examination Guidelines: A complete evaluation includes B-mode imaging, spectral Doppler, color Doppler, and power Doppler as needed of all accessible portions of each vessel. Bilateral testing is considered an integral part of a complete examination. Limited examinations for reoccurring indications may be performed as noted.  Right Findings: +----------+------------+---------+-----------+----------+---------------------+  RIGHT      Compressible Phasicity Spontaneous Properties        Summary         +----------+------------+---------+-----------+----------+---------------------+  IJV            Full        Yes        Yes                                       +----------+------------+---------+-----------+----------+---------------------+  Subclavian     Full        Yes        Yes                                        +----------+------------+---------+-----------+----------+---------------------+  Axillary       Full        Yes        Yes                                       +----------+------------+---------+-----------+----------+---------------------+  Brachial       None        No         No                 Acute - one of paired                                                                  brachials        +----------+------------+---------+-----------+----------+---------------------+  Radial         Full                                                             +----------+------------+---------+-----------+----------+---------------------+  Ulnar          Full                                                             +----------+------------+---------+-----------+----------+---------------------+  Cephalic       Full                                                             +----------+------------+---------+-----------+----------+---------------------+  Basilic      Partial       Yes        Yes                        Acute          +----------+------------+---------+-----------+----------+---------------------+  Left Findings: +----------+------------+---------+-----------+----------+-------+  LEFT       Compressible Phasicity Spontaneous Properties Summary  +----------+------------+---------+-----------+----------+-------+  Subclavian     Full        Yes        Yes                         +----------+------------+---------+-----------+----------+-------+  Summary:  Right: Findings consistent with acute deep vein thrombosis involving the right brachial veins. Findings consistent with acute superficial vein thrombosis involving the right basilic vein.  Left: No evidence of thrombosis in the subclavian.  *See table(s) above for measurements and observations.  Diagnosing physician: Jamelle Haring Electronically signed by Jamelle Haring on 12/21/2021 at 5:12:32 AM.    Final    [place] EKG SITE RITE  Result Date:  12/18/2021 If Site Rite image not attached, placement could not be confirmed due to current cardiac rhythm.  DG ESOPHAGUS W SINGLE CM (SOL OR THIN BA)  Result Date: 12/24/2021 CLINICAL DATA:  Abnormal esophagus on swallowing evaluation. EXAM: ESOPHOGRAM/BARIUM SWALLOW TECHNIQUE: Single contrast examination was performed using  thin barium. FLUOROSCOPY TIME:  Fluoroscopy Time:  24 seconds Radiation Exposure Index (if provided by the fluoroscopic device): 2.4 mGy Number of Acquired Spot Images: 0 COMPARISON:  CT of the chest of December 20, 2021. FINDINGS: Only a very limited esophagram could be performed. A small amount of barium contrast was swallowed which outlined debris within the esophagus proximal to an area of narrowing. Abrupt narrowing of the esophagus in the proximal to mid esophageal level corresponding to thickening seen on prior CT imaging. The patient was unable to continue for a complete esophageal evaluation due to this finding and vomited after swallowing only a small amount of contrast. 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 Endoscopic assessment is suggested for further evaluation/potential treatment and to exclude the less likely possibility of concomitant neoplasm. This exam was performed by Alfredia Ferguson, PA, and was supervised and interpreted by Zetta Bills Electronically Signed   By: Zetta Bills M.D.   On: 12/24/2021 16:02   (Echo, Carotid, EGD, Colonoscopy, ERCP)    Subjective: Patient seen and examined.  She is looking forward to go home.  She tells me that she feels back to herself, she needs to go home so she can see her granddaughter. Tolerated soft diet without any problems.   Discharge Exam: Vitals:   12/26/21 2042 12/27/21 0615  BP: 110/76 94/70  Pulse: (!)  110 100  Resp: 18 20  Temp: 98.1 F (36.7 C) 98.6 F (37 C)  SpO2: 97% 93%   Vitals:   12/26/21 1340 12/26/21 1426 12/26/21 2042 12/27/21 0615  BP: 112/83 (!) 118/98 110/76 94/70  Pulse: (!) 115 (!) 109 (!) 110 100  Resp: (!) 22 18 18 20   Temp:  98.3 F (36.8 C) 98.1 F (36.7 C) 98.6 F (37 C)  TempSrc:  Oral Oral Oral  SpO2: 94% 96% 97% 93%  Weight:  Height:        General: Pt is alert, awake, not in acute distress Cardiovascular: RRR, S1/S2 +, no rubs, no gallops Respiratory: CTA bilaterally, no wheezing, no rhonchi Abdominal: Soft, NT, ND, bowel sounds + Extremities: Right arm with some ecchymosis, diffuse swelling and mild tenderness along the volar aspect of the forearm.    The results of significant diagnostics from this hospitalization (including imaging, microbiology, ancillary and laboratory) are listed below for reference.     Microbiology: Recent Results (from the past 240 hour(s))  Culture, blood (routine x 2)     Status: None   Collection Time: 12/17/21  6:32 PM   Specimen: BLOOD LEFT HAND  Result Value Ref Range Status   Specimen Description   Final    BLOOD LEFT HAND Performed at [facility], [place] [address], [place], [place] [address]    Special Requests   Final    BOTTLES DRAWN AEROBIC ONLY Blood Culture results may not be optimal due to an inadequate volume of blood received in culture bottles Performed at [place] [address] [place], [place] [address]    Culture   Final    NO GROWTH 5 DAYS Performed at [facility], [place] [address], [place], [place] [address]    Report Status 12/22/2021 FINAL  Final  Culture, blood (routine x 2)     Status: None   Collection Time: 12/17/21  6:32 PM   Specimen: BLOOD LEFT FOREARM  Result Value Ref Range Status   Specimen Description   Final    BLOOD LEFT FOREARM Performed at [place] [address] [place], [place] [address]     Special Requests   Final    BOTTLES DRAWN AEROBIC ONLY Blood Culture results may not be optimal due to an inadequate volume of blood received in culture bottles Performed at [place] [address], [place], [place] [address]    Culture   Final    NO GROWTH 5 DAYS Performed at [facility], [place] [address], [place], [place] [address]    Report Status 12/22/2021 FINAL  Final  Urine Culture     Status: Abnormal   Collection Time: 12/18/21  3:00 AM   Specimen: Urine, Clean Catch  Result Value Ref Range Status   Specimen Description   Final    URINE, CLEAN CATCH Performed at [facility] [place] [address], [place], [place] [address]    Special Requests   Final    Immunocompromised Performed at [facility], [place] [address], [place], [place] [address]    Culture (A)  Final    <10,000 COLONIES/mL INSIGNIFICANT GROWTH Performed at [place] [address], [place], [place] [address]    Report Status 12/19/2021 FINAL  Final  MRSA Next Gen by PCR, Nasal     Status: None   Collection Time: 12/19/21  5:40 AM   Specimen: Nasal Mucosa; Nasal Swab  Result Value Ref Range Status   MRSA by PCR Next Gen NOT DETECTED NOT DETECTED Final    Comment: (NOTE) The GeneXpert MRSA Assay (FDA approved for NASAL specimens only), is one component of a comprehensive MRSA colonization surveillance program. It is not intended to diagnose MRSA infection nor to guide or monitor treatment for MRSA infections. Test performance is not FDA approved in patients less than 58 years old. Performed at [facility], [address] [place],  [place] [address]      Labs: BNP (last 3 results) Recent Labs    12/19/21 1548 12/20/21 0435  BNP 368.7* 175.1*   Basic Metabolic Panel: Recent Labs  Lab 12/22/21 0513 12/22/21 0514 12/23/21 0449 12/24/21 0406 12/25/21 0500 12/26/21 0345  NA  --  132* 133* 133* 137 137   K  --  4.0 4.1 3.8 3.4* 3.8  CL  --  100 101 100 99 100  CO2  --  25 24 24 30 26   GLUCOSE  --  127* 123* 86 82 84  BUN  --  28* 29* 27* 20 19  CREATININE  --  1.05* 0.90 0.92 0.92 0.84  CALCIUM  --  8.9 8.5* 9.0 9.1 9.7  MG 1.5*  --  1.6* 1.9 1.5* 1.8  PHOS 2.9  --  3.2 2.3* 4.6 2.7   Liver Function Tests: Recent Labs  Lab 12/22/21 0514 12/23/21 0449 12/24/21 0406 12/25/21 0500 12/26/21 0345  AST 13* 11* 10* 10* 10*  ALT 25 19 17 16 15   ALKPHOS 63 61 60 59 58  BILITOT 0.9 0.5 0.7 0.7 0.4  PROT 5.7* 5.8* 6.1* 6.1* 6.5  ALBUMIN 2.5* <1.5* 2.8* 2.9* 3.1*   No results for input(s): LIPASE, AMYLASE in the last 168 hours. No results for input(s): AMMONIA in the last 168 hours. CBC: Recent Labs  Lab 12/23/21 0449 12/23/21 1643 12/24/21 0406 12/25/21 0500 12/26/21 0345 12/27/21 0428  WBC 5.0 7.3 7.1 7.8 7.9 6.2  NEUTROABS 4.0 3.4 4.3 3.2 3.6  --   HGB 8.4* 9.6* 9.2* 9.7* 9.4* 8.5*  HCT 25.5* 28.5* 28.3* 28.5* 28.5* 26.0*  MCV 97.0 94.4 97.6 96.3 96.9 98.1  PLT 39* 54* 52* 58* 72* 80*   Cardiac Enzymes: No results for input(s): CKTOTAL, CKMB, CKMBINDEX, TROPONINI in the last 168 hours. BNP: Invalid input(s): POCBNP CBG: No results for input(s): GLUCAP in the last 168 hours. D-Dimer No results for input(s): DDIMER in the last 72 hours. Hgb A1c No results for input(s): HGBA1C in the last 72 hours. Lipid Profile No results for input(s): CHOL, HDL, LDLCALC, TRIG, CHOLHDL, LDLDIRECT in the last 72 hours. Thyroid function studies No results for input(s): TSH, T4TOTAL, T3FREE, THYROIDAB in the last 72 hours.  Invalid input(s): FREET3 Anemia work up No results for input(s): VITAMINB12, FOLATE, FERRITIN, TIBC, IRON, RETICCTPCT in the last 72 hours. Urinalysis    Component Value Date/Time   COLORURINE YELLOW 12/18/2021 0300   APPEARANCEUR CLEAR 12/18/2021 0300   LABSPEC 1.013 12/18/2021 0300   PHURINE 5.0 12/18/2021 0300   GLUCOSEU NEGATIVE 12/18/2021 0300   HGBUR  SMALL (A) 12/18/2021 0300   BILIRUBINUR NEGATIVE 12/18/2021 0300   KETONESUR 5 (A) 12/18/2021 0300   PROTEINUR 30 (A) 12/18/2021 0300   UROBILINOGEN 0.2 12/30/2013 1037   NITRITE POSITIVE (A) 12/18/2021 0300   LEUKOCYTESUR NEGATIVE 12/18/2021 0300   Sepsis Labs Invalid input(s): PROCALCITONIN,  WBC,  LACTICIDVEN Microbiology Recent Results (from the past 240 hour(s))  Culture, blood (routine x 2)     Status: None   Collection Time: 12/17/21  6:32 PM   Specimen: BLOOD LEFT HAND  Result Value Ref Range Status   Specimen Description   Final    BLOOD LEFT HAND Performed at [facility], [place] [address], [place], [place] [address]    Special Requests   Final    BOTTLES DRAWN AEROBIC ONLY Blood Culture results may not be optimal due to an inadequate volume of blood received in  culture bottles Performed at [place] [address] [place], [place] [address]    Culture   Final    NO GROWTH 5 DAYS Performed at [facility], [place] [address], [place], [place] [address]    Report Status 12/22/2021 FINAL  Final  Culture, blood (routine x 2)     Status: None   Collection Time: 12/17/21  6:32 PM   Specimen: BLOOD LEFT FOREARM  Result Value Ref Range Status   Specimen Description   Final    BLOOD LEFT FOREARM Performed at [place] [address], [place], [place] [address]    Special Requests   Final    BOTTLES DRAWN AEROBIC ONLY Blood Culture results may not be optimal due to an inadequate volume of blood received in culture bottles Performed at [place] [address], [place], [place] [address]    Culture   Final    NO GROWTH 5 DAYS Performed at [facility], [place] [address], [place], [place] [address]    Report Status 12/22/2021 FINAL  Final  Urine Culture     Status: Abnormal   Collection Time: 12/18/21  3:00 AM   Specimen: Urine, Clean Catch  Result Value Ref Range Status    Specimen Description   Final    URINE, CLEAN CATCH Performed at [facility], [place] [address], [place], [place] [address]    Special Requests   Final    Immunocompromised Performed at [facility], [place] [address], [place], [place] [address]    Culture (A)  Final    <10,000 COLONIES/mL INSIGNIFICANT GROWTH Performed at [place] [address], [place], [place] [address]    Report Status 12/19/2021 FINAL  Final  MRSA Next Gen by PCR, Nasal     Status: None   Collection Time: 12/19/21  5:40 AM   Specimen: Nasal Mucosa; Nasal Swab  Result Value Ref Range Status   MRSA by PCR Next Gen NOT DETECTED NOT DETECTED Final    Comment: (NOTE) The GeneXpert MRSA Assay (FDA approved for NASAL specimens only), is one component of a comprehensive MRSA colonization surveillance program. It is not intended to diagnose MRSA infection nor to guide or monitor treatment for MRSA infections. Test performance is not FDA approved in patients less than 77 years old. Performed at [facility], [place] [address], [place], [place] [address]      Time coordinating discharge:  40 minutes  SIGNED:   Barb Merino, MD  [facility] 12/27/2021, 12:42 PM

## 2021-12-27 NOTE — Plan of Care (Signed)
  Problem: Activity: Goal: Risk for activity intolerance will decrease Outcome: Progressing   Problem: Nutrition: Goal: Adequate nutrition will be maintained Outcome: Progressing   Problem: Coping: Goal: Level of anxiety will decrease Outcome: Progressing   Problem: Elimination: Goal: Will not experience complications related to bowel motility Outcome: Progressing   

## 2022-01-01 ENCOUNTER — Emergency Department (HOSPITAL_COMMUNITY): Payer: Medicaid Other

## 2022-01-01 ENCOUNTER — Encounter (HOSPITAL_COMMUNITY): Payer: Self-pay

## 2022-01-01 ENCOUNTER — Other Ambulatory Visit: Payer: Self-pay

## 2022-01-01 ENCOUNTER — Emergency Department (HOSPITAL_COMMUNITY)
Admission: EM | Admit: 2022-01-01 | Discharge: 2022-01-01 | Disposition: A | Discharge status: Home / Self Care | Payer: Medicaid Other | Attending: Emergency Medicine | Admitting: Emergency Medicine

## 2022-01-01 DIAGNOSIS — K209 Esophagitis, unspecified without bleeding: Secondary | ICD-10-CM | POA: Diagnosis not present

## 2022-01-01 DIAGNOSIS — K222 Esophageal obstruction: Secondary | ICD-10-CM | POA: Diagnosis not present

## 2022-01-01 DIAGNOSIS — R0789 Other chest pain: Secondary | ICD-10-CM | POA: Diagnosis not present

## 2022-01-01 DIAGNOSIS — M79601 Pain in right arm: Secondary | ICD-10-CM | POA: Insufficient documentation

## 2022-01-01 DIAGNOSIS — D72829 Elevated white blood cell count, unspecified: Secondary | ICD-10-CM | POA: Diagnosis not present

## 2022-01-01 DIAGNOSIS — Z7901 Long term (current) use of anticoagulants: Secondary | ICD-10-CM | POA: Diagnosis not present

## 2022-01-01 DIAGNOSIS — C3490 Malignant neoplasm of unspecified part of unspecified bronchus or lung: Secondary | ICD-10-CM | POA: Diagnosis present

## 2022-01-01 LAB — COMPREHENSIVE METABOLIC PANEL
ALT: 15 U/L (ref 0–44)
AST: 13 U/L — ABNORMAL LOW (ref 15–41)
Albumin: 3.8 g/dL (ref 3.5–5.0)
Alkaline Phosphatase: 60 U/L (ref 38–126)
Anion gap: 12 (ref 5–15)
BUN: 18 mg/dL (ref 6–20)
CO2: 18 mmol/L — ABNORMAL LOW (ref 22–32)
Calcium: 9.7 mg/dL (ref 8.9–10.3)
Chloride: 107 mmol/L (ref 98–111)
Creatinine, Ser: 1.09 mg/dL — ABNORMAL HIGH (ref 0.44–1.00)
GFR, Estimated: >60 mL/min (ref >60)
Glucose, Bld: 90 mg/dL (ref 70–99)
Potassium: 3.4 mmol/L — ABNORMAL LOW (ref 3.5–5.1)
Sodium: 137 mmol/L (ref 135–145)
Total Bilirubin: 0.4 mg/dL (ref 0.3–1.2)
Total Protein: 7.7 g/dL (ref 6.5–8.1)

## 2022-01-01 LAB — CBC WITH DIFFERENTIAL/PLATELET
Abs Immature Granulocytes: 0.37 10*3/uL — ABNORMAL HIGH (ref 0.00–0.07)
Basophils Absolute: 0 10*3/uL (ref 0.0–0.1)
Basophils Relative: 0 %
Eosinophils Absolute: 0 10*3/uL (ref 0.0–0.5)
Eosinophils Relative: 0 %
HCT: 36.1 % (ref 36.0–46.0)
Hemoglobin: 11.4 g/dL — ABNORMAL LOW (ref 12.0–15.0)
Immature Granulocytes: 3 %
Lymphocytes Relative: 6 %
Lymphs Abs: 0.7 10*3/uL (ref 0.7–4.0)
MCH: 32.9 pg (ref 26.0–34.0)
MCHC: 31.6 g/dL (ref 30.0–36.0)
MCV: 104.3 fL — ABNORMAL HIGH (ref 80.0–100.0)
Monocytes Absolute: 1 10*3/uL (ref 0.1–1.0)
Monocytes Relative: 9 %
Neutro Abs: 9.1 10*3/uL — ABNORMAL HIGH (ref 1.7–7.7)
Neutrophils Relative %: 82 %
Platelets: 216 10*3/uL (ref 150–400)
RBC: 3.46 MIL/uL — ABNORMAL LOW (ref 3.87–5.11)
RDW: 18.4 % — ABNORMAL HIGH (ref 11.5–15.5)
WBC: 11.1 10*3/uL — ABNORMAL HIGH (ref 4.0–10.5)
nRBC: 0 % (ref 0.0–0.2)

## 2022-01-01 MED ORDER — SODIUM CHLORIDE 0.9 % IV SOLN
1000.0000 mL | INTRAVENOUS | Status: DC
  Administered 2022-01-01: 1000 mL via INTRAVENOUS

## 2022-01-01 MED ORDER — IOHEXOL 350 MG/ML SOLN
80.0000 mL | Freq: Once | INTRAVENOUS | Status: AC | PRN
  Administered 2022-01-01: 75 mL via INTRAVENOUS

## 2022-01-01 MED ORDER — MORPHINE SULFATE (PF) 4 MG/ML IV SOLN
4.0000 mg | INTRAVENOUS | Status: AC | PRN
  Administered 2022-01-01 (×2): 4 mg via INTRAVENOUS
  Filled 2022-01-01 (×2): qty 1

## 2022-01-01 MED ORDER — SODIUM CHLORIDE (PF) 0.9 % IJ SOLN
INTRAMUSCULAR | Status: AC
  Filled 2022-01-01: qty 50

## 2022-01-01 MED ORDER — PANTOPRAZOLE SODIUM 40 MG IV SOLR
40.0000 mg | Freq: Once | INTRAVENOUS | Status: AC
  Administered 2022-01-01: 40 mg via INTRAVENOUS
  Filled 2022-01-01: qty 10

## 2022-01-01 MED ORDER — IOHEXOL 350 MG/ML SOLN
75.0000 mL | Freq: Once | INTRAVENOUS | Status: AC | PRN
  Administered 2022-01-01: 75 mL via INTRAVENOUS

## 2022-01-01 MED ORDER — HYDROMORPHONE HCL 4 MG PO TABS: 4.0000 mg | ORAL_TABLET | Freq: Four times a day (QID) | ORAL | 0 refills | Status: AC | PRN

## 2022-01-01 MED ORDER — SODIUM CHLORIDE 0.9 % IV BOLUS (SEPSIS)
500.0000 mL | Freq: Once | INTRAVENOUS | Status: AC
  Administered 2022-01-01: 500 mL via INTRAVENOUS

## 2022-01-01 MED ORDER — ONDANSETRON HCL 4 MG/2ML IJ SOLN
4.0000 mg | Freq: Once | INTRAMUSCULAR | Status: AC
  Administered 2022-01-01: 4 mg via INTRAVENOUS
  Filled 2022-01-01: qty 2

## 2022-01-01 MED ORDER — HYDROMORPHONE HCL 1 MG/ML IJ SOLN
1.0000 mg | Freq: Once | INTRAMUSCULAR | Status: AC
  Administered 2022-01-01: 1 mg via INTRAVENOUS
  Filled 2022-01-01: qty 1

## 2022-01-01 NOTE — Discharge Instructions (Addendum)
Continue the sucralfate and the Protonix.  Take the medications as needed for pain.  Follow-up with your oncology doctors and GI doctors as we discussed

## 2022-01-01 NOTE — ED Notes (Signed)
Per EDP IV can be placed in right arm  Left arm unable to use due to CT contrast infiltration

## 2022-01-01 NOTE — ED Provider Notes (Signed)
[facility] DEPT Provider Note   CSN: [id number] Arrival date & time: 01/01/22  1103     History  No chief complaint on file.   [name] is a 51 y.o. female.  HPI   Patient has a complex medical history of COPD, asthma, depression, DVT of the right upper extremity associated with PICC line, dysphagia associated with radiation esophagitis and lung cancer.Prior records reviewed.  Patient was recently admitted to hospital on January 20 and left on February 2.   Patient was treated for acute respiratory failure and pancytopenia.  Patient was also noted to have esophageal stricture.  Patient had an endoscopy and had food removal performed.  Patient was not dilated as the tissue was friable and seemingly open.  Patient has completed chemotherapy of cisplatin and etoposide.   Patient presents to the ED with complaints of having sharp pain on the right side of her chest and arm.  She is also having issues with persistent difficulty swallowing solids.  She can swallow liquids without difficulty but finds that solid foods still having up.  She called the oncology office and was instructed to come to the ED.    Home Medications Prior to Admission medications   Medication Sig Start Date End Date Taking? Authorizing Provider  HYDROmorphone (DILAUDID) 4 MG tablet Take 1 tablet (4 mg total) by mouth every 6 (six) hours as needed for up to 5 days for severe pain. 01/01/22 01/06/22 Yes Dorie Rank, MD  albuterol (PROVENTIL HFA;VENTOLIN HFA) 108 (90 Base) MCG/ACT inhaler Inhale 2 puffs into the lungs every 6 (six) hours as needed for wheezing or shortness of breath. 03/13/17   Alfonse Spruce, FNP  APIXABAN Arne [place]) VTE STARTER PACK (10MG  AND 5MG ) Take as directed on package: start with two-5mg  tablets twice daily for 7 days. On day 8, switch to one-5mg  tablet twice daily. 12/27/21   Barb Merino, MD  diphenhydramine-acetaminophen (TYLENOL PM) 25-500 MG TABS tablet Take 3  tablets by mouth at bedtime as needed (sleep).    [provider]  HYDROmorphone (DILAUDID) 2 MG tablet Take 1 tablet (2 mg total) by mouth every 4 (four) hours as needed for severe pain. 12/27/21   Barb Merino, MD  magnesium oxide (MAG-OX) 400 (240 Mg) MG tablet Take 1 tablet (400 mg total) by mouth 3 (three) times daily. 10/30/21   Curt Bears, MD  nicotine (NICODERM CQ - DOSED IN MG/24 HOURS) 21 mg/24hr patch Place 1 patch (21 mg total) onto the skin daily. 12/28/21   Barb Merino, MD  ondansetron (ZOFRAN ODT) 8 MG disintegrating tablet Dissolve 1 tablet (8 mg total) by mouth every 8 (eight) hours as needed for nausea or vomiting. 10/11/21   Heilingoetter, Cassandra L, PA-C  oxyCODONE ER (XTAMPZA ER) 18 MG C12A Take 18 mg by mouth 2 (two) times daily for 7 days. 12/27/21 01/03/22  Barb Merino, MD  pantoprazole (PROTONIX) 40 MG tablet Take 1 tablet (40 mg total) by mouth 2 (two) times daily. 12/27/21 01/26/22  Barb Merino, MD  predniSONE (DELTASONE) 10 MG tablet 3 tabs daily for 2 days 2 tabs daily for 2 days 1 tab daily for 2 days 12/27/21   Barb Merino, MD  sucralfate (CARAFATE) 1 GM/10ML suspension Take 10 mLs (1 g total) by mouth 4 (four) times daily -  with meals and at bedtime. 12/27/21   Barb Merino, MD      Allergies    Naproxen and Bactrim [sulfamethoxazole-trimethoprim]    Review of  Systems   Review of Systems  All other systems reviewed and are negative.  Physical Exam Updated Vital Signs BP 119/76    Pulse 97    Temp 98.3 F (36.8 C) (Oral)    Resp 15    Ht 1.727 m (5\' 8" )    Wt 68 kg    LMP 04/24/2018    SpO2 98%    BMI 22.79 kg/m  Physical Exam Vitals and nursing note reviewed.  Constitutional:      Appearance: She is well-developed. She is not diaphoretic.  HENT:     Head: Normocephalic and atraumatic.     Right Ear: External ear normal.     Left Ear: External ear normal.  Eyes:     General: No scleral icterus.       Right eye: No discharge.         Left eye: No discharge.     Conjunctiva/sclera: Conjunctivae normal.  Neck:     Trachea: No tracheal deviation.  Cardiovascular:     Rate and Rhythm: Normal rate and regular rhythm.  Pulmonary:     Effort: Pulmonary effort is normal. No respiratory distress.     Breath sounds: Normal breath sounds. No stridor. No wheezing or rales.  Abdominal:     General: Bowel sounds are normal. There is no distension.     Palpations: Abdomen is soft.     Tenderness: There is no abdominal tenderness. There is no guarding or rebound.  Musculoskeletal:        General: No tenderness or deformity.     Cervical back: Neck supple.  Skin:    General: Skin is warm and dry.     Findings: No rash.  Neurological:     General: No focal deficit present.     Mental Status: She is alert.     Cranial Nerves: No cranial nerve deficit (no facial droop, extraocular movements intact, no slurred speech).     Sensory: No sensory deficit.     Motor: No abnormal muscle tone or seizure activity.     Coordination: Coordination normal.  Psychiatric:        Mood and Affect: Mood normal.    ED Results / Procedures / Treatments   Labs (all labs ordered are listed, but only abnormal results are displayed) Labs Reviewed  COMPREHENSIVE METABOLIC PANEL - Abnormal; Notable for the following components:      Result Value   Potassium 3.4 (*)    CO2 18 (*)    Creatinine, Ser 1.09 (*)    AST 13 (*)    All other components within normal limits  CBC WITH DIFFERENTIAL/PLATELET - Abnormal; Notable for the following components:   WBC 11.1 (*)    RBC 3.46 (*)    Hemoglobin 11.4 (*)    MCV 104.3 (*)    RDW 18.4 (*)    Neutro Abs 9.1 (*)    Abs Immature Granulocytes 0.37 (*)    All other components within normal limits    EKG EKG Interpretation  Date/Time:  Tuesday January 01 2022 11:19:55 EST Ventricular Rate:  114 PR Interval:    QRS Duration: 85 QT Interval:  380 QTC Calculation: 524 R Axis:   84 Text  Interpretation: Sinus tachycardia Borderline T abnormalities, diffuse leads Prolonged QT interval Since last tracing rate slower Reconfirmed by Dorie Rank [id number]) on 01/01/2022 3:07:26 PM  Radiology DG Chest 2 View  Result Date: 01/01/2022 CLINICAL DATA:  Dysphagia.  History of lung cancer EXAM: CHEST -  2 VIEW COMPARISON:  12/27/2021 FINDINGS: The heart size and mediastinal contours are stable. Mildly hyperinflated lungs. No new focal airspace consolidation. No pleural effusion or pneumothorax. The visualized skeletal structures are unremarkable. IMPRESSION: No active cardiopulmonary disease. Electronically Signed   By: Davina Poke D.O.   On: 01/01/2022 12:12   CT Angio Chest PE W and/or Wo Contrast  Result Date: 01/01/2022 CLINICAL DATA:  Pulmonary embolus suspected with high probability. Right-sided chest pain and dysphagia. EXAM: CT ANGIOGRAPHY CHEST WITH CONTRAST TECHNIQUE: Multidetector CT imaging of the chest was performed using the standard protocol during bolus administration of intravenous contrast. Multiplanar CT image reconstructions and MIPs were obtained to evaluate the vascular anatomy. RADIATION DOSE REDUCTION: This exam was performed according to the departmental dose-optimization program which includes automated exposure control, adjustment of the mA and/or kV according to patient size and/or use of iterative reconstruction technique. CONTRAST:  69mL OMNIPAQUE IOHEXOL 350 MG/ML SOLN COMPARISON:  12/20/2021 FINDINGS: Cardiovascular: There is moderately good opacification of the central and segmental pulmonary arteries. No focal filling defects. No evidence of significant pulmonary embolus. The right upper lobe pulmonary artery is narrowed extrinsically due to the mass lesion. This appearance is unchanged since the prior study. Normal heart size. No pericardial effusions. Normal caliber thoracic aorta. No aortic dissection. Great vessel origins are patent. Mediastinum/Nodes: Thyroid gland is  unremarkable. Thickening of the wall of the esophagus most prominent in the upper esophageal region. No significant dilatation. This could represent reflux disease or esophagitis. Radiation therapy could also have this appearance if within a port. Mildly enlarged right hilar lymph nodes at 1.3 cm diameter, unchanged. Lungs/Pleura: Severe diffuse emphysematous changes in the lungs. Dependent changes in the lung bases. Right suprahilar nodule measuring about 1.8 cm in diameter with associated scarring. This is consistent with known neoplasm and is unchanged. Focal area of vague airspace disease in the right upper lung posteriorly. This corresponds to an area of more dense consolidation on the previous study and is consistent with interval improvement. No pleural effusions. No pneumothorax. Upper Abdomen: No acute abnormalities demonstrated in the visualized upper abdomen. Musculoskeletal: No chest wall abnormality. No acute or significant osseous findings. Review of the MIP images confirms the above findings. IMPRESSION: 1. No evidence of significant pulmonary embolus. 2. Right suprahilar nodule and prominent right hilar lymph nodes consistent with known neoplasm. Scarring likely represents post treatment reactions. 3. Extensive emphysematous changes throughout the lungs. 4. Resolving focal area of consolidation in the right upper lung posteriorly. 5. Persistent esophageal wall thickening which could indicate reflux disease, esophagitis, or radiation change. Electronically Signed   By: Lucienne Capers M.D.   On: 01/01/2022 19:06    Procedures Procedures    Medications Ordered in ED Medications  sodium chloride 0.9 % bolus 500 mL (500 mLs Intravenous New Bag/Given 01/01/22 1603)    Followed by  0.9 %  sodium chloride infusion (1,000 mLs Intravenous New Bag/Given 01/01/22 1630)  sodium chloride (PF) 0.9 % injection (has no administration in time range)  morphine (PF) 4 MG/ML injection 4 mg (4 mg Intravenous  Given 01/01/22 1836)  ondansetron (ZOFRAN) injection 4 mg (4 mg Intravenous Given 01/01/22 1603)  iohexol (OMNIPAQUE) 350 MG/ML injection 80 mL (75 mLs Intravenous Contrast Given 01/01/22 1629)  iohexol (OMNIPAQUE) 350 MG/ML injection 75 mL (75 mLs Intravenous Contrast Given 01/01/22 1840)  HYDROmorphone (DILAUDID) injection 1 mg (1 mg Intravenous Given 01/01/22 1907)  pantoprazole (PROTONIX) injection 40 mg (40 mg Intravenous Given 01/01/22 1907)  ED Course/ Medical Decision Making/ A&P Clinical Course as of 01/01/22 1931  Tue Jan 01, 2022  1504 CBC with Differential(!) White blood cell count elevated 11.1. [JK]  1504 CBC with Differential(!) Hemoglobin of 11.4 is increased compared to previous values [JK]  1505 Comprehensive metabolic panel(!) Creatinine slightly increased compared to previous values [JK]  1505 DG Chest 2 View Chest x-ray images and radiology report reviewed.  No acute findings. [JK]  1913 CT Angio Chest PE W and/or Wo Contrast CT scan findings reviewed.  No PE noted.  Patient does have persistent findings with her malignancy as well as esophageal irritation [JK]    Clinical Course User Index [JK] Dorie Rank, MD                           Medical Decision Making Amount and/or Complexity of Data Reviewed Labs:  Decision-making details documented in ED Course. Radiology: ordered. Decision-making details documented in ED Course.  Risk Prescription drug management.   Chest pain Patient with known lung cancer.  Presented with worsening chest pain.  No pneumonia on x-ray.  Symptoms not suggestive of cardiac etiology.  Concern about the possibility of pulmonary embolism so CT angiogram was performed.  Fortunately no signs of acute PE.  Dysphagia esophagitis. Patient noted to have esophagitis on her previous hospitalization.  She did have an endoscopy.  Patient is having persistent symptoms.  She is able to tolerate liquids though.  No indication for repeat endoscopy at this  time.  CT scan today does show persistent inflammation.  Likely somewhat related to her radiation treatments.  She is already on Carafate and Protonix.  We will have her continue that.  Chronic pain Related to combination of her cancer and her esophagitis.  Patient is on Dilaudid tablets.  She states that not working.  I will give her a short course of 4 mg tablets as opposed to the 2 mg.  Recommend close outpatient follow-up with her oncologist as well as her GI doctor.  Evaluation and diagnostic testing in the emergency department does not suggest an emergent condition requiring admission or immediate intervention beyond what has been performed at this time.  The patient is safe for discharge and has been instructed to return immediately for worsening symptoms, change in symptoms or any other concerns.        Final Clinical Impression(s) / ED Diagnoses Final diagnoses:  Esophagitis  Malignant neoplasm of lung, unspecified laterality, unspecified part of lung ([place])    Rx / DC Orders ED Discharge Orders          Ordered    HYDROmorphone (DILAUDID) 4 MG tablet  Every 6 hours PRN        01/01/22 1929              Dorie Rank, MD 01/01/22 1931

## 2022-01-01 NOTE — ED Provider Triage Note (Signed)
Emergency Medicine Provider Triage Evaluation Note  [name] , a 51 y.o. female  was evaluated in triage.  Pt complains of right-sided chest pain and dysphagia.  Patient was recently discharged from the hospital for similar symptoms.  States she was well for about 24 hours and began declining again.  Patient feels like food is getting stuck around the mid sternum.  Also complaining of shortness of breath.  Review of Systems  Positive:  Negative: See above   Physical Exam  BP (!) 125/96 (BP Location: Right Arm)    Pulse (!) 124    Temp 98.3 F (36.8 C) (Oral)    Resp 16    Ht 5\' 8"  (1.727 m)    Wt 68 kg    LMP 04/24/2018    SpO2 99%    BMI 22.79 kg/m  Gen:   Awake, no distress   Resp:  Normal effort  MSK:   Moves extremities without difficulty  Other:  Tachycardic.  Medical Decision Making  Medically screening exam initiated at 11:48 AM.  Appropriate orders placed.  KHARIZMA LESNICK was informed that the remainder of the evaluation will be completed by another provider, this initial triage assessment does not replace that evaluation, and the importance of remaining in the ED until their evaluation is complete.     Myna Bright [place], [place] 01/01/22 1156

## 2022-01-01 NOTE — ED Triage Notes (Signed)
"  She was recently admitted to the hospital for respiratory distress. While admitted she got blood transfusions and developed a blood clot in her right arm while she was in the hospital and did an endoscopy with no dilation due to difficulty swallowing on Wednesday. She was discharged from the hospital Thursday and all of her symptoms have started to come back and dizziness has increased" per mother  Patient currently being treated in oncology with chemo and radiation.

## 2022-01-02 ENCOUNTER — Telehealth: Payer: Self-pay | Admitting: Internal Medicine

## 2022-01-02 NOTE — Telephone Encounter (Signed)
Sch per 2/7 inbasket, left msg

## 2022-01-03 ENCOUNTER — Encounter: Payer: Self-pay | Admitting: Radiation Oncology

## 2022-01-03 ENCOUNTER — Ambulatory Visit
Admission: RE | Admit: 2022-01-03 | Discharge: 2022-01-03 | Disposition: A | Discharge status: Home / Self Care | Payer: Medicaid Other | Source: Ambulatory Visit | Attending: Radiation Oncology | Admitting: Radiation Oncology

## 2022-01-03 ENCOUNTER — Other Ambulatory Visit: Payer: Self-pay

## 2022-01-03 VITALS — BP 125/92 | HR 126 | Temp 97.6°F | Resp 20 | Ht 68.0 in | Wt 153.6 lb

## 2022-01-03 DIAGNOSIS — I700 Atherosclerosis of aorta: Secondary | ICD-10-CM | POA: Insufficient documentation

## 2022-01-03 DIAGNOSIS — I2699 Other pulmonary embolism without acute cor pulmonale: Secondary | ICD-10-CM | POA: Diagnosis not present

## 2022-01-03 DIAGNOSIS — Z79899 Other long term (current) drug therapy: Secondary | ICD-10-CM | POA: Insufficient documentation

## 2022-01-03 DIAGNOSIS — R000 Tachycardia, unspecified: Secondary | ICD-10-CM | POA: Diagnosis not present

## 2022-01-03 DIAGNOSIS — Z7952 Long term (current) use of systemic steroids: Secondary | ICD-10-CM | POA: Insufficient documentation

## 2022-01-03 DIAGNOSIS — C3411 Malignant neoplasm of upper lobe, right bronchus or lung: Secondary | ICD-10-CM | POA: Diagnosis not present

## 2022-01-03 DIAGNOSIS — J439 Emphysema, unspecified: Secondary | ICD-10-CM | POA: Insufficient documentation

## 2022-01-03 DIAGNOSIS — Z86711 Personal history of pulmonary embolism: Secondary | ICD-10-CM | POA: Insufficient documentation

## 2022-01-03 DIAGNOSIS — Z923 Personal history of irradiation: Secondary | ICD-10-CM | POA: Insufficient documentation

## 2022-01-03 NOTE — Progress Notes (Signed)
Radiation Oncology         [phone] ________________________________  Name: [name] MRN: [id number]  Date: 01/03/2022  DOB: July 26, 1971  Follow-Up Visit Note  CC: [name], PA-C  Livengood, Jessica J, [place]*    ICD-10-CM   1. 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  The patient tolerated radiation therapy with some difficulty. She reported esophagitis and chest/throat pain from frequent coughing, fatigue, itching sensation on her back, mild skin irritation of the chest, frequent productive cough, and trouble swallowing which caused her difficulty getting any liquids or solids down. She also reported ongoing chronic tachycardia which was present prior to RT. On 11/11/21, the patient also presented to the ED for dehydration due to nausea and vomiting and was treated with IV fluids with relief.     Since her initial consultation date, the patient underwent bronchoscopy with RUL and nodal biopsies on 09/21/21 which revealed malignant cells consistent with small cell carcinoma of the RUL. FNA of 4R, 10R, and 11R lymph nodes revealed malignant cells consistent with small cell carcinoma in 3/3 lymph nodes.       The patient proceeded to begin systemic chemotherapy consisting of cisplatin and etoposide on 10/03/21 through 12/06/21 under the care of Dr. Julien Nordmann.   The patient reported continued significant esophagitis during a follow up with Cassandra Heilingoetter PA-C on 12/04/21. She reported inability to tolerate viscous lidocaine and Carafate due to  texture and consistency which causes emesis for her. She was also noted to have lost about 10 pound since her last appointment. With her last cycle of treatment, the patient was also noted to have developed significant neutropenia with an ANC of 0.1.  She received Zarxio injections daily x3.  She also developed a cough in the interval for which she saw symptom management clinic.  Chest x-ray at that time did not show any acute infection and noted a decrease in size of the lung mass  The patient again presented to the ED and was admitted on 12/14/21 due to nausea, vomiting, shortness of breath, and right-sided chest pain. (She had her last chemo infusion on 01/12). The patient also reported inability to eat or keep any meals down.  In the emergency room, there was evidence of radiation esophagitis, and chest x-ray findings were concerning for aspiration pneumonia.  She was treated for acute respiratory failure and pancytopenia with steroids, antibiotics and Lasix.  She was initially on oxygen and BiPAP and eventually returned to baseline on room air.  She also developed dysphagia, which prompted an upper GI endoscopy on 12/26/21 which revealed evidence of a mid-esophageal benign stricture and food impaction. She was also found to have right upper extremity DVT with indwelling PICC line that was placed during hospitalization, which was managed with heparin. She was discharged on 12/27/21 with improvement of symptoms. CTA on 12/14/21 also showed improving mediastinal and right hilar mass/adenopathy, and a thickened upper to midthoracic esophagus.   On 01/01/22, the patient returned to the ED due to onset of sharp  pain on the right side of her chest and arm.  She is reported persistent difficulty swallowing solids.  For chest pain, x-ray performed showed no evidence of pneumonia, and CT showed no signs of acute PE. CT did however show persistent inflammation, noted as likely somewhat related to her radiation  treatments and likely the cause of her ongoing dysphagia.  She is on Carafate and Protonix for this and was instructed to continue on this regimen. For her chronic pain, her dilaudid was upped to a short course of 4mg  tablets (2mg  prior). Following evaluation, the patient was deemed safe for discharge and educated on return precautions.   Pertinent imaging since the patient was seen for her initial consultation in October includes:  -- Chest CTA on 09/15/21 to evaluate possible PE demonstrated the right hilar/suprahilar mass as contiguous with adenopathy consistent with malignancy. The mass was seen to cause complete occlusion of the right upper lobe bronchus. The right upper lobe pulmonary artery was also seen as attenuated and encased by the right suprahilar mass, without evidence of intraluminal thrombus. CT also showed right paratracheal, right infrahilar, and subcarinal adenopathy, as well as additional right upper lobe and right apical pulmonary nodules. No evidence of PE was demonstrated.  -- MRI of the brain on 09/18/21 demonstrated no evidence of metastatic disease or acute intracranial abnormality.  --PET on 10/12/21 revealed the hypermetabolic right upper lobe/perihilar mass and adjacent hypermetabolic adenopathy consistent with known non-small cell lung cancer. Significant interval improvement of the mass was appreciated since the prior CT scan, suggestive of a good response to chemo radiation. The right apical nodules appeared similar as well. However, several hypermetabolic bone lesions were appreciated consistent with metastatic disease. Some healing pathologic fractures involving the right seventh and eighth anterolateral ribs were also appreciated, and a small hypermetabolic focus involving the left transverse process of T5. Left iliac and left sacral bone lesions were also noted. --Chest CTA on 12/20/21 showed a right suprahilar nodule measuring 18 mm, consistent with known malignancy, as  well as circumferential wall thickening of the right upper lobe bronchus with intraluminal debris and subsequent bronchial narrowing ( noted as likely infectious or inflammatory in etiology). Given prominent esophageal thickening, the possibility of aspiration was noted. The tiny left upper lobe nodule and subpleural opacity at the right lung apex were both appreciated as unchanged in the interval.    She is feeling better after being discharged from the hospital recently.  She did undergo endoscopy by Dr. Watt Climes while in the hospital.  The patient was noted to have esophagitis consistent with radiation effect.  No ulceration.  The patient was noted to have slight narrowing of the esophagus but not enough to dilate.  Patient did have some food impaction superior to the stricture.  Patient can swallow liquids at this time although continues to be somewhat painful.  She can also take an some very soft foods.  She is able to swallow 2% milk well and I recommended she drink this to help with her protein status.  She is able to eat popcorn which I strongly discouraged as this may lodged in her esophagus again.  She is on Protonix as well as Carafate suspension.  She is fatigued but denies any significant breathing issues at this time.   Allergies:  is allergic to naproxen and bactrim [sulfamethoxazole-trimethoprim].  Meds: Current Outpatient Medications  Medication Sig Dispense Refill   albuterol (PROVENTIL HFA;VENTOLIN HFA) 108 (90 Base) MCG/ACT inhaler Inhale 2 puffs into the lungs  every 6 (six) hours as needed for wheezing or shortness of breath. 1 Inhaler 2   APIXABAN (ELIQUIS) VTE STARTER PACK (10MG  AND 5MG ) Take as directed on package: start with two-5mg  tablets twice daily for 7 days. On day 8, switch to one-5mg  tablet twice daily. 1 each 0   diphenhydramine-acetaminophen (TYLENOL PM) 25-500 MG TABS tablet Take 3 tablets by mouth at bedtime as needed (sleep).     HYDROmorphone (DILAUDID) 4 MG tablet  Take 1 tablet (4 mg total) by mouth every 6 (six) hours as needed for up to 5 days for severe pain. 16 tablet 0   ondansetron (ZOFRAN ODT) 8 MG disintegrating tablet Dissolve 1 tablet (8 mg total) by mouth every 8 (eight) hours as needed for nausea or vomiting. 30 tablet 2   pantoprazole (PROTONIX) 40 MG tablet Take 1 tablet (40 mg total) by mouth 2 (two) times daily. 60 tablet 0   predniSONE (DELTASONE) 10 MG tablet 3 tabs daily for 2 days 2 tabs daily for 2 days 1 tab daily for 2 days 12 tablet 0   sucralfate (CARAFATE) 1 GM/10ML suspension Take 10 mLs (1 g total) by mouth 4 (four) times daily -  with meals and at bedtime. 420 mL 0   HYDROmorphone (DILAUDID) 2 MG tablet Take 1 tablet (2 mg total) by mouth every 4 (four) hours as needed for severe pain. (Patient not taking: Reported on 01/03/2022) 30 tablet 0   magnesium oxide (MAG-OX) 400 (240 Mg) MG tablet Take 1 tablet (400 mg total) by mouth 3 (three) times daily. (Patient not taking: Reported on 01/03/2022) 90 tablet 2   nicotine (NICODERM CQ - DOSED IN MG/24 HOURS) 21 mg/24hr patch Place 1 patch (21 mg total) onto the skin daily. (Patient not taking: Reported on 01/03/2022) 28 patch 0   oxyCODONE ER (XTAMPZA ER) 18 MG C12A Take 18 mg by mouth 2 (two) times daily for 7 days. (Patient not taking: Reported on 01/03/2022) 14 capsule 0   No current facility-administered medications for this encounter.    Physical Findings: The patient is in no acute distress. Patient is alert and oriented.  height is 5\' 8"  (1.727 m) and weight is 153 lb 9.6 oz (69.7 kg). Her temperature is 97.6 F (36.4 C). Her blood pressure is 125/92 (abnormal) and her pulse is 126 (abnormal). Her respiration is 20 and oxygen saturation is 98%. . . Lungs are clear to auscultation bilaterally. Heart has regular rate and rhythm. No palpable cervical, supraclavicular, or axillary adenopathy. Abdomen soft, non-tender, normal bowel sounds.  She has always had a abnormally high pulse with all  of her follow-ups in our clinic.  She denies any pain in other sites of her body.  Only some pain in the central chest region.   Lab Findings: Lab Results  Component Value Date   WBC 11.1 (H) 01/01/2022   HGB 11.4 (L) 01/01/2022   HCT 36.1 01/01/2022   MCV 104.3 (H) 01/01/2022   PLT 216 01/01/2022    Radiographic Findings: DG Chest 1 View  Result Date: 12/18/2021 CLINICAL DATA:  Increased shortness of breath EXAM: CHEST  1 VIEW COMPARISON:  12/18/2021 FINDINGS: The heart size and mediastinal contours are within normal limits. Both lungs are clear. The visualized skeletal structures are unremarkable. IMPRESSION: No active disease. Electronically Signed   By: Kathreen Devoid M.D.   On: 12/18/2021 10:08   DG Chest 2 View  Result Date: 01/01/2022 CLINICAL DATA:  Dysphagia.  History of lung cancer EXAM:  CHEST - 2 VIEW COMPARISON:  12/27/2021 FINDINGS: The heart size and mediastinal contours are stable. Mildly hyperinflated lungs. No new focal airspace consolidation. No pleural effusion or pneumothorax. The visualized skeletal structures are unremarkable. IMPRESSION: No active cardiopulmonary disease. Electronically Signed   By: Davina Poke D.O.   On: 01/01/2022 12:12   CT Angio Chest PE W and/or Wo Contrast  Result Date: 01/01/2022 CLINICAL DATA:  Pulmonary embolus suspected with high probability. Right-sided chest pain and dysphagia. EXAM: CT ANGIOGRAPHY CHEST WITH CONTRAST TECHNIQUE: Multidetector CT imaging of the chest was performed using the standard protocol during bolus administration of intravenous contrast. Multiplanar CT image reconstructions and MIPs were obtained to evaluate the vascular anatomy. RADIATION DOSE REDUCTION: This exam was performed according to the departmental dose-optimization program which includes automated exposure control, adjustment of the mA and/or kV according to patient size and/or use of iterative reconstruction technique. CONTRAST:  26mL OMNIPAQUE IOHEXOL 350  MG/ML SOLN COMPARISON:  12/20/2021 FINDINGS: Cardiovascular: There is moderately good opacification of the central and segmental pulmonary arteries. No focal filling defects. No evidence of significant pulmonary embolus. The right upper lobe pulmonary artery is narrowed extrinsically due to the mass lesion. This appearance is unchanged since the prior study. Normal heart size. No pericardial effusions. Normal caliber thoracic aorta. No aortic dissection. Great vessel origins are patent. Mediastinum/Nodes: Thyroid gland is unremarkable. Thickening of the wall of the esophagus most prominent in the upper esophageal region. No significant dilatation. This could represent reflux disease or esophagitis. Radiation therapy could also have this appearance if within a port. Mildly enlarged right hilar lymph nodes at 1.3 cm diameter, unchanged. Lungs/Pleura: Severe diffuse emphysematous changes in the lungs. Dependent changes in the lung bases. Right suprahilar nodule measuring about 1.8 cm in diameter with associated scarring. This is consistent with known neoplasm and is unchanged. Focal area of vague airspace disease in the right upper lung posteriorly. This corresponds to an area of more dense consolidation on the previous study and is consistent with interval improvement. No pleural effusions. No pneumothorax. Upper Abdomen: No acute abnormalities demonstrated in the visualized upper abdomen. Musculoskeletal: No chest wall abnormality. No acute or significant osseous findings. Review of the MIP images confirms the above findings. IMPRESSION: 1. No evidence of significant pulmonary embolus. 2. Right suprahilar nodule and prominent right hilar lymph nodes consistent with known neoplasm. Scarring likely represents post treatment reactions. 3. Extensive emphysematous changes throughout the lungs. 4. Resolving focal area of consolidation in the right upper lung posteriorly. 5. Persistent esophageal wall thickening which could  indicate reflux disease, esophagitis, or radiation change. Electronically Signed   By: Lucienne Capers M.D.   On: 01/01/2022 19:06   CT Angio Chest Pulmonary Embolism (PE) W or WO Contrast  Result Date: 12/20/2021 CLINICAL DATA:  Shortness of breath. Cough. Tachycardia. DVT. History of lung cancer. EXAM: CT ANGIOGRAPHY CHEST WITH CONTRAST TECHNIQUE: Multidetector CT imaging of the chest was performed using the standard protocol during bolus administration of intravenous contrast. Multiplanar CT image reconstructions and MIPs were obtained to evaluate the vascular anatomy. RADIATION DOSE REDUCTION: This exam was performed according to the departmental dose-optimization program which includes automated exposure control, adjustment of the mA and/or kV according to patient size and/or use of iterative reconstruction technique. CONTRAST:  35mL OMNIPAQUE IOHEXOL 350 MG/ML SOLN COMPARISON:  Radiograph yesterday. Chest CTA 6 days ago 12/24/2021, PET CT 10/12/2021 reviewed FINDINGS: Cardiovascular: Examination is diagnostic to the distal segmental level. No central pulmonary embolus. The subsegmental branches are  not well assessed due to contrast bolus timing. The heart is normal in size. No pericardial effusion. Aortic atherosclerosis without dissection or acute aortic findings. Right upper extremity PICC tip in the lower SVC. There is contrast refluxing into the hepatic veins and IVC. Mediastinum/Nodes: Anterior paratracheal nodes measure up to 8 mm, not significantly changed from prior. There is a 10 mm right hilar lymph node. Circumferential wall thickening of the mid and upper esophagus which is mildly improved. The distal esophagus is patulous. Lungs/Pleura: Emphysema. Right suprahilar nodule measures 18 mm, series 6, image 60. There is circumferential wall thickening of the right upper lobe bronchus with intraluminal debris and subsequent bronchial narrowing. Well demarcated subpleural opacity in the right upper  lobe, series 6, image 30, 15 mm and not significantly changed from prior exam and prior PET. Left upper lobe 5 mm nodule series 6, image 37, unchanged. There is debris within the right lower lobe bronchus with bronchial thickening and adjacent peribronchovascular opacities in the right lower lobe, for example series 6, image 82. Basilar assessment is limited by motion. No significant pleural effusion. Upper Abdomen: No acute findings. Musculoskeletal: Healing right anterior seventh and eighth rib fractures are unchanged. No new osseous findings. Review of the MIP images confirms the above findings. IMPRESSION: 1. No central pulmonary embolus. 2. There is contrast refluxing into the hepatic veins and IVC suggestive elevated right heart pressures. 3. Right suprahilar nodule measuring 18 mm, consistent with known malignancy. 4. There is circumferential wall thickening of the right upper lobe bronchus with intraluminal debris and subsequent bronchial narrowing. Right lower lobe bronchial thickening with intraluminal debris and adjacent peribronchovascular opacities in the right lower lobe, likely infectious or inflammatory. Given prominent esophageal thickening, the possibility of aspiration is raised. 5. Unchanged tiny left upper lobe nodule. Unchanged subpleural opacity at the right lung apex. 6. Emphysema. Aortic Atherosclerosis (ICD10-I70.0) and Emphysema (ICD10-J43.9). Electronically Signed   By: Keith Rake M.D.   On: 12/20/2021 17:54   CT Angio Chest PE W/Cm &/Or Wo Cm  Result Date: 12/14/2021 CLINICAL DATA:  Right chest pain, tachycardia. Lung cancer. Shortness of breath. EXAM: CT ANGIOGRAPHY CHEST WITH CONTRAST TECHNIQUE: Multidetector CT imaging of the chest was performed using the standard protocol during bolus administration of intravenous contrast. Multiplanar CT image reconstructions and MIPs were obtained to evaluate the vascular anatomy. RADIATION DOSE REDUCTION: This exam was performed  according to the departmental dose-optimization program which includes automated exposure control, adjustment of the mA and/or kV according to patient size and/or use of iterative reconstruction technique. CONTRAST:  48mL OMNIPAQUE IOHEXOL 350 MG/ML SOLN COMPARISON:  Chest CT 09/15/2021.  PET CT 10/12/2021. FINDINGS: Cardiovascular: No filling defects in the pulmonary arteries to suggest pulmonary emboli. Heart is normal size. Aorta is normal caliber. Mediastinum/Nodes: Esophagus appears thick walled in the upper to midthoracic region. Previously seen right paratracheal adenopathy in the area much improved. No measurable enlarged mediastinal, hilar or axillary adenopathy. Lungs/Pleura: Previously seen right hilar mass improved, measuring 1.5 cm compared to 2 cm on prior PET CT and 6 cm on prior chest CT. Mild nodular airspace disease in the right upper lobe in the area of dense consolidation previously. Scarring in the right apex. Mild emphysema. Left upper lobe nodule posteriorly on image 40 of series 10 measures 5 mm, stable. No new or enlarging pulmonary nodules. No effusions. Upper Abdomen: Imaging into the upper abdomen demonstrates no acute findings. Musculoskeletal: Probable healing pathologic fractures in the right anterior 7th and 8th ribs are  again seen, unchanged. No acute fracture. Review of the MIP images confirms the above findings. IMPRESSION: No evidence of pulmonary embolus. Improving mediastinal and right hilar mass/adenopathy. Thickened upper to midthoracic esophagus. 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 Aortic Atherosclerosis (ICD10-I70.0) and Emphysema (ICD10-J43.9). Electronically Signed   By: Rolm Baptise M.D.   On: 12/14/2021 18:36   IR Chest Fluoro  Result Date: 12/27/2021 INDICATION: Malfunctioning PICC line 51 year old female with history of indwelling right upper extremity PICC line with difficulty PICC  line removal on the floor. IR requested to evaluate and removed under fluoroscopic guidance. EXAM: FLUOROSCOPIC GUIDED PICC LINE removal MEDICATIONS: None. CONTRAST:  None FLUOROSCOPY TIME:  0 minutes 18 seconds, 0 mGy COMPLICATIONS: None immediate. TECHNIQUE: The procedure, risks, benefits, and alternatives were explained to the patient and informed written consent was obtained. The right upper extremity and external portion of the existing PICC line was prepped with chlorhexidine in a sterile fashion, and a sterile drape was applied covering the operative field. Maximum barrier sterile technique with sterile gowns and gloves were used for the procedure. A timeout was performed prior to the initiation of the procedure. The PICC line was attempted to be removed however there was significant resistance and stretching of the external portion of the catheter with associated right upper extremity pain reported by the patient. The central portion of the catheter was unchanged with the tip in the right innominate vein. A 0.018 inch Nitrex wire was inserted via the PICC line which then was attempted to be removed over the wire which was also unsuccessful with similar symptoms. Ultrasound evaluation demonstrated the PICC line coursing through a short segment of the biceps musculature. Therefore, subdermal Local anesthesia was provided near the PICC line skin entry site with 1% lidocaine. A skin nick was made from the established skin nick at the catheter entry site. Gentle blunt dissection was performed deep to the skin entry site. With more force than usually required, the PICC line was then retracted and removed intact. A sterile bandage was applied and hemostasis was achieved. The patient tolerated procedure well without immediate complication. The procedure was performed with assistance by Soyla Dryer, NP. FINDINGS: Difficult to remove PICC line, likely secondary to intramuscular course into the brachial vein.  IMPRESSION: Successful fluoroscopic and ultrasound-guided removal of indwelling right upper extremity PICC line. Ruthann Cancer, MD [facility] Radiology Electronically Signed   By: Ruthann Cancer M.D.   On: 12/27/2021 16:30   DG CHEST PORT 1 VIEW  Result Date: 12/27/2021 CLINICAL DATA:  PICC line placement EXAM: PORTABLE CHEST 1 VIEW COMPARISON:  12/18/2021 FINDINGS: Right-sided PICC line with the tip projecting over the SVC. No focal consolidation. No pleural effusion or pneumothorax. Heart and mediastinal contours are unremarkable. No acute osseous abnormality. IMPRESSION: 1. Right-sided PICC line with the tip projecting over the SVC. Electronically Signed   By: Kathreen Devoid M.D.   On: 12/27/2021 13:40   DG CHEST PORT 1 VIEW  Result Date: 12/19/2021 CLINICAL DATA:  Pneumonia EXAM: PORTABLE CHEST 1 VIEW COMPARISON:  Chest radiograph 12/19/2021 FINDINGS: There is a new right upper extremity PICC terminating at the cavoatrial junction. The cardiomediastinal silhouette is stable. There are patchy opacities in the medial right base, slightly increased from prior. There is no other focal airspace disease. There is no significant pleural effusion. There is no pneumothorax. There is no acute osseous abnormality. IMPRESSION: 1. New  right upper extremity PICC with tip at the region of the cavoatrial junction. 2. Patchy opacities in the medial right base are slightly increased from prior and could reflect developing infection. Electronically Signed   By: Valetta Mole M.D.   On: 12/19/2021 08:04   DG CHEST PORT 1 VIEW  Result Date: 12/18/2021 CLINICAL DATA:  Shortness of breath EXAM: PORTABLE CHEST 1 VIEW COMPARISON:  12/17/2021 FINDINGS: Lungs are hyperinflated as can be seen with COPD. Hazy left lower lobe airspace disease which may reflect atelectasis versus pneumonia unchanged compared with the prior exam. No pleural effusion or pneumothorax. Heart and mediastinal  contours are unremarkable. No acute osseous abnormality. IMPRESSION: Stable hazy left lower lobe airspace disease which may reflect atelectasis versus pneumonia. Electronically Signed   By: Kathreen Devoid M.D.   On: 12/18/2021 08:40   DG CHEST PORT 1 VIEW  Result Date: 12/17/2021 CLINICAL DATA:  Nausea and shortness of breath. EXAM: PORTABLE CHEST 1 VIEW COMPARISON:  Radiographs and chest CTA 3 days ago 12/14/2021, FINDINGS: Developing patchy opacity at the left lung base, new from prior exam. There is increase some bronchial thickening. Normal heart size with unchanged mediastinal contours. No significant pleural effusion or pneumothorax. IMPRESSION: 1. Developing patchy opacity at the left lung base over the last 3 days may be atelectasis or pneumonia. 2. Increased bronchial thickening. Electronically Signed   By: Keith Rake M.D.   On: 12/17/2021 17:35   DG Chest Port 1 View  Result Date: 12/14/2021 CLINICAL DATA:  Chest pain short of breath EXAM: PORTABLE CHEST 1 VIEW COMPARISON:  11/28/2021, CT 09/15/2021 FINDINGS: No acute airspace disease or effusion. Stable cardiomediastinal silhouette. Similar right suprahilar opacity corresponding to known history of lung cancer. No pneumothorax IMPRESSION: No active disease. Similar small right suprahilar opacity, corresponding to history of lung cancer. Electronically Signed   By: Donavan Foil M.D.   On: 12/14/2021 16:41   DG Swallowing Func-Speech Pathology  Result Date: 12/24/2021 Table formatting from the original result was not included. Objective Swallowing Evaluation: Type of Study: MBS-Modified Barium Swallow Study  Patient Details Name: OLUWASEUN BRUYERE MRN: [id number] Date of Birth: Dec 05, 1970 Today's Date: 12/24/2021 Time: SLP Start Time (ACUTE ONLY): 1400 -SLP Stop Time (ACUTE ONLY): 1425 SLP Time Calculation (min) (ACUTE ONLY): 25 min Past Medical History: Past Medical History: Diagnosis Date  Ankle sprain   left  Anxiety   treated for panic  attacks in the past.  Asthma   COPD (chronic obstructive pulmonary disease) (HCC)   Depression   History of radiation therapy   Right lung- 09/26/21-11/13/21- Dr. Gery Pray  Irregular heart rate   Lung cancer [facility])   Renal disorder   kidney infections Past Surgical History: Past Surgical History: Procedure Laterality Date  APPENDECTOMY    BRONCHIAL BIOPSY  09/21/2021  Procedure: BRONCHIAL BIOPSIES;  Surgeon: Collene Gobble, MD;  Location: [place];  Service: Pulmonary;;  BRONCHIAL BRUSHINGS  09/21/2021  Procedure: BRONCHIAL BRUSHINGS;  Surgeon: Collene Gobble, MD;  Location: [place];  Service: Pulmonary;;  BRONCHIAL NEEDLE ASPIRATION BIOPSY  09/21/2021  Procedure: BRONCHIAL NEEDLE ASPIRATION BIOPSIES;  Surgeon: Collene Gobble, MD;  Location: [facility];  Service: Pulmonary;;  LUMBAR LAMINECTOMY N/A 05/13/2018  Procedure: LEFT LUMBAR FOUR-FIVE MICRODISCECTOMY;  Surgeon: Marybelle Killings, MD;  Location: [place];  Service: Orthopedics;  Laterality: N/A;  TUBAL LIGATION    TUBAL LIGATION  1998  VIDEO BRONCHOSCOPY WITH ENDOBRONCHIAL ULTRASOUND N/A 09/21/2021  Procedure: VIDEO BRONCHOSCOPY WITH ENDOBRONCHIAL ULTRASOUND;  Surgeon:  Collene Gobble, MD;  Location: [facility];  Service: Pulmonary;  Laterality: N/A; HPI: Pt is a 51 yo female adm to [facility] on 1/21 with some increase in dyspnea.  Per medical notes pt has lung cancer undergoing chemoradiation. "She was noted to have vomited w/ carafate and lidocaine viscous; 1/20  w/ cc: shortness of breath and chest pain. Completed chemo about 8d prior w/ post-chemo course c/b nausea, vomiting and poor po intake.1/22 diet advanced to full liq. Got another unit PLTs. Had some hemoptysis w/ cough 1/23 new fever, rapid response called for increased WOB and tachycardia. Pt anxious. PCXR w/ possible LL airspace disease. 1/24 remained tachycardic, still having CP and shoulder pain, still having nausea but no emesis. Developed progressive respiratory distress w/ loud audible  expiratory wheezing. Pulse oximetry did not drop significantly however O2 was rapidly titrated up w/ little improvement in WOB. PCCM called emergently to bedside. Marked Upper airway expiratory wheezing noted w/ significant reduction during phonation efforts and when reassured." Swallow eval ordered.  Pt admits to "choking" on liquids since her cancer diagnosis in October 2022,  Subjective: pleasant, but this morning started having increasing difficulty with swallowing and currently unable to even swallow water  Recommendations for follow up therapy are one component of a multi-disciplinary discharge planning process, led by the attending physician.  Recommendations may be updated based on patient status, additional functional criteria and insurance authorization. Assessment / Plan / Recommendation Clinical Impressions 12/24/2021 Clinical Impression Patient presents with a normal oral phase of swallow, mild pharyngeal phase dysphagia and a severe esophageal dysphagia. First bolus tested was small cup sip of thin liquid barium which resulted in mild swallow initiation delay to vallecular sinus but no penetration or aspiration. Second bolus tested was 1/2 spoonful of puree texture. During pharyngeal transit of puree, thin liquid barium could be seen refluxing up into pharynx. After initial swallow, puree and likely thin liquid barium mix almost immediately refluxed back up into pharynx. Patient was able regurgitate barium by 'hocking' it up after it had moved into vallecular sinus. Esophageal sweep revealed stasis of barium in esophagus just below UES. Barium continued to reflux from below UES and into pharynx. SLP concluded test at this time and patient was able to continue to regurgitate barium and expectorate into emesis bag. SLP informed MD of results. At this time, although patient did not exhibit any aspiration or penetration of thin liquids or puree solids, her esophageal phase of swallow is severely impaired.  SLP recommending NPO except for ice chips, water or other liquid sips for pleasure. Patient is understanding and at this time, PO's are not giving her any pleasure. SLP to continue to follow patient with plan to consult more with MD regarding POC moving foward. SLP Visit Diagnosis Dysphagia, pharyngoesophageal phase (R13.14) Attention and concentration deficit following -- Frontal lobe and executive function deficit following -- Impact on safety and function --   Treatment Recommendations 12/24/2021 Treatment Recommendations Therapy as outlined in treatment plan below   Prognosis 12/24/2021 Prognosis for Safe Diet Advancement Guarded Barriers to Reach Goals Severity of deficits Barriers/Prognosis Comment -- Diet Recommendations 12/24/2021 SLP Diet Recommendations NPO;Ice chips PRN after oral care;Other (Comment) Liquid Administration via Cup Medication Administration Via alternative means Compensations Small sips/bites Postural Changes --   Other Recommendations 12/24/2021 Recommended Consults -- Oral Care Recommendations Oral care BID;Patient independent with oral care Other Recommendations -- Follow Up Recommendations Other (comment) Assistance recommended at discharge None Functional Status Assessment Patient has had a recent  decline in their functional status and demonstrates the ability to make significant improvements in function in a reasonable and predictable amount of time. Frequency and Duration  12/24/2021 Speech Therapy Frequency (ACUTE ONLY) min 2x/week Treatment Duration 1 week   Oral Phase 12/24/2021 Oral Phase WFL Oral - Pudding Teaspoon -- Oral - Pudding Cup -- Oral - Honey Teaspoon -- Oral - Honey Cup -- Oral - Nectar Teaspoon -- Oral - Nectar Cup -- Oral - Nectar Straw -- Oral - Thin Teaspoon -- Oral - Thin Cup -- Oral - Thin Straw -- Oral - Puree -- Oral - Mech Soft -- Oral - Regular -- Oral - Multi-Consistency -- Oral - Pill -- Oral Phase - Comment --  Pharyngeal Phase 12/24/2021 Pharyngeal Phase  Impaired Pharyngeal- Pudding Teaspoon -- Pharyngeal -- Pharyngeal- Pudding Cup -- Pharyngeal -- Pharyngeal- Honey Teaspoon -- Pharyngeal -- Pharyngeal- Honey Cup -- Pharyngeal -- Pharyngeal- Nectar Teaspoon -- Pharyngeal -- Pharyngeal- Nectar Cup -- Pharyngeal -- Pharyngeal- Nectar Straw -- Pharyngeal -- Pharyngeal- Thin Teaspoon -- Pharyngeal -- Pharyngeal- Thin Cup Delayed swallow initiation-vallecula Pharyngeal -- Pharyngeal- Thin Straw -- Pharyngeal -- Pharyngeal- Puree WFL Pharyngeal -- Pharyngeal- Mechanical Soft -- Pharyngeal -- Pharyngeal- Regular -- Pharyngeal -- Pharyngeal- Multi-consistency -- Pharyngeal -- Pharyngeal- Pill -- Pharyngeal -- Pharyngeal Comment --  Cervical Esophageal Phase  12/24/2021 Cervical Esophageal Phase Impaired Pudding Teaspoon -- Pudding Cup -- Honey Teaspoon -- Honey Cup -- Nectar Teaspoon -- Nectar Cup -- Nectar Straw -- Thin Teaspoon -- Thin Cup Esophageal backflow into the pharynx Thin Straw -- Puree Esophageal backflow into the pharynx Mechanical Soft -- Regular -- Multi-consistency -- Pill -- Cervical Esophageal Comment -- Sonia Baller, MA, CCC-SLP Speech Therapy                     ECHOCARDIOGRAM COMPLETE  Result Date: 12/20/2021    ECHOCARDIOGRAM REPORT   Patient Name:   EUTHA CUDE Date of Exam: 12/20/2021 Medical Rec #:  [id number]      Height:       68.0 in Accession #:    [id number]     Weight:       151.7 lb Date of Birth:  [birth year]      BSA:          1.817 m Patient Age:    50 years       BP:           143/94 mmHg Patient Gender: F              HR:           117 bpm. Exam Location:  Inpatient Procedure: 2D Echo Indications:    Elevated brain natriuretic peptide level  History:        Patient has prior history of Echocardiogram examinations and                 Patient has no prior history of Echocardiogram examinations.                 COPD. Irregular heartrate.  Sonographer:    Arlyss Gandy Referring Phys: [place]  1. Left  ventricular ejection fraction, by estimation, is 60 to 65%. The left ventricle has normal function. The left ventricle has no regional wall motion abnormalities. Left ventricular diastolic parameters were normal.  2. Right ventricular systolic function is normal. The right ventricular size is normal. Tricuspid regurgitation signal is inadequate for assessing PA pressure.  3. The pericardial effusion is anterior to the right ventricle.  4. The mitral valve is normal in structure. Trivial mitral valve regurgitation. No evidence of mitral stenosis.  5. The aortic valve is normal in structure. Aortic valve regurgitation is not visualized. No aortic stenosis is present.  6. The inferior vena cava is normal in size with <50% respiratory variability, suggesting right atrial pressure of 8 mmHg. FINDINGS  Left Ventricle: Left ventricular ejection fraction, by estimation, is 60 to 65%. The left ventricle has normal function. The left ventricle has no regional wall motion abnormalities. The left ventricular internal cavity size was normal in size. There is  no left ventricular hypertrophy. Left ventricular diastolic parameters were normal. Normal left ventricular filling pressure. Right Ventricle: The right ventricular size is normal. No increase in right ventricular wall thickness. Right ventricular systolic function is normal. Tricuspid regurgitation signal is inadequate for assessing PA pressure. Left Atrium: Left atrial size was normal in size. Right Atrium: Right atrial size was normal in size. Pericardium: Trivial pericardial effusion is present. The pericardial effusion is anterior to the right ventricle. Mitral Valve: The mitral valve is normal in structure. Trivial mitral valve regurgitation. No evidence of mitral valve stenosis. Tricuspid Valve: The tricuspid valve is normal in structure. Tricuspid valve regurgitation is not demonstrated. No evidence of tricuspid stenosis. Aortic Valve: The aortic valve is normal in  structure. Aortic valve regurgitation is not visualized. No aortic stenosis is present. Aortic valve mean gradient measures 3.0 mmHg. Aortic valve peak gradient measures 6.0 mmHg. Aortic valve area, by VTI measures 2.03 cm. Pulmonic Valve: The pulmonic valve was normal in structure. Pulmonic valve regurgitation is trivial. No evidence of pulmonic stenosis. Aorta: The aortic root is normal in size and structure. Venous: The inferior vena cava is normal in size with less than 50% respiratory variability, suggesting right atrial pressure of 8 mmHg. IAS/Shunts: No atrial level shunt detected by color flow Doppler.  LEFT VENTRICLE PLAX 2D LVIDd:         4.40 cm   Diastology LVIDs:         3.60 cm   LV e' medial:    14.10 cm/s LV PW:         0.90 cm   LV E/e' medial:  8.5 LV IVS:        0.90 cm   LV e' lateral:   17.70 cm/s LVOT diam:     2.00 cm   LV E/e' lateral: 6.8 LV SV:         41 LV SV Index:   22 LVOT Area:     3.14 cm  RIGHT VENTRICLE             IVC RV Basal diam:  3.00 cm     IVC diam: 2.20 cm RV Mid diam:    3.60 cm RV S prime:     17.30 cm/s TAPSE (M-mode): 1.6 cm LEFT ATRIUM             Index        RIGHT ATRIUM          Index LA diam:        3.20 cm 1.76 cm/m   RA Area:     9.01 cm LA Vol (A2C):   33.7 ml 18.55 ml/m  RA Volume:   16.80 ml 9.25 ml/m LA Vol (A4C):   37.0 ml 20.36 ml/m LA Biplane Vol: 35.3 ml 19.43 ml/m  AORTIC VALVE AV Area (Vmax):  1.89 cm AV Area (Vmean):   1.84 cm AV Area (VTI):     2.03 cm AV Vmax:           122.00 cm/s AV Vmean:          86.000 cm/s AV VTI:            0.201 m AV Peak Grad:      6.0 mmHg AV Mean Grad:      3.0 mmHg LVOT Vmax:         73.50 cm/s LVOT Vmean:        50.500 cm/s LVOT VTI:          0.130 m LVOT/AV VTI ratio: 0.65  AORTA Ao Root diam: 3.20 cm Ao Asc diam:  3.20 cm MITRAL VALVE MV Area (PHT): 3.77 cm     SHUNTS MV Decel Time: 201 msec     Systemic VTI:  0.13 m MV E velocity: 120.00 cm/s  Systemic Diam: 2.00 cm Fransico Him MD Electronically signed  by Fransico Him MD Signature Date/Time: 12/20/2021/2:34:21 PM    Final    VAS [place] UPPER EXTREMITY VENOUS DUPLEX  Result Date: 12/21/2021 UPPER VENOUS STUDY  Patient Name:  VIBHA FERDIG  Date of Exam:   12/20/2021 Medical Rec #: [id number]       Accession #:    [id number] Date of Birth: [birth year]       Patient Gender: F Patient Age:   50 years Exam Location:  [facility] Procedure:      VAS [place] UPPER EXTREMITY VENOUS DUPLEX Referring Phys: A POWELL JR --------------------------------------------------------------------------------  Indications: Edema Other Indications: PICC line in RUE. Risk Factors: CA patient on chemotherapy. Comparison Study: No previous exams Performing Technologist: Jody Hill RVT, RDMS  Examination Guidelines: A complete evaluation includes B-mode imaging, spectral Doppler, color Doppler, and power Doppler as needed of all accessible portions of each vessel. Bilateral testing is considered an integral part of a complete examination. Limited examinations for reoccurring indications may be performed as noted.  Right Findings: +----------+------------+---------+-----------+----------+---------------------+  RIGHT      Compressible Phasicity Spontaneous Properties        Summary         +----------+------------+---------+-----------+----------+---------------------+  IJV            Full        Yes        Yes                                       +----------+------------+---------+-----------+----------+---------------------+  Subclavian     Full        Yes        Yes                                       +----------+------------+---------+-----------+----------+---------------------+  Axillary       Full        Yes        Yes                                       +----------+------------+---------+-----------+----------+---------------------+  Brachial       None        No  No                 Acute - one of paired                                                                   brachials        +----------+------------+---------+-----------+----------+---------------------+  Radial         Full                                                             +----------+------------+---------+-----------+----------+---------------------+  Ulnar          Full                                                             +----------+------------+---------+-----------+----------+---------------------+  Cephalic       Full                                                             +----------+------------+---------+-----------+----------+---------------------+  Basilic      Partial       Yes        Yes                        Acute          +----------+------------+---------+-----------+----------+---------------------+  Left Findings: +----------+------------+---------+-----------+----------+-------+  LEFT       Compressible Phasicity Spontaneous Properties Summary  +----------+------------+---------+-----------+----------+-------+  Subclavian     Full        Yes        Yes                         +----------+------------+---------+-----------+----------+-------+  Summary:  Right: Findings consistent with acute deep vein thrombosis involving the right brachial veins. Findings consistent with acute superficial vein thrombosis involving the right basilic vein.  Left: No evidence of thrombosis in the subclavian.  *See table(s) above for measurements and observations.  Diagnosing physician: Jamelle Haring Electronically signed by Jamelle Haring on 12/21/2021 at 5:12:32 AM.    Final    [place] EKG SITE RITE  Result Date: 12/18/2021 If Site Rite image not attached, placement could not be confirmed due to current cardiac rhythm.  DG ESOPHAGUS W SINGLE CM (SOL OR THIN BA)  Result Date: 12/24/2021 CLINICAL DATA:  Abnormal esophagus on swallowing evaluation. EXAM: ESOPHOGRAM/BARIUM SWALLOW TECHNIQUE: Single contrast examination was performed using  thin barium. FLUOROSCOPY TIME:  Fluoroscopy Time:  24 seconds  Radiation Exposure Index (if provided by the fluoroscopic device): 2.4 mGy Number of Acquired Spot Images: 0 COMPARISON:  CT of the chest of December 20, 2021. FINDINGS: Only  a very limited esophagram could be performed. A small amount of barium contrast was swallowed which outlined debris within the esophagus proximal to an area of narrowing. Abrupt narrowing of the esophagus in the proximal to mid esophageal level corresponding to thickening seen on prior CT imaging. The patient was unable to continue for a complete esophageal evaluation due to this finding and vomited after swallowing only a small amount of contrast. 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 Endoscopic assessment is suggested for further evaluation/potential treatment and to exclude the less likely possibility of concomitant neoplasm. This exam was performed by Alfredia Ferguson, PA, and was supervised and interpreted by Zetta Bills Electronically Signed   By: Zetta Bills M.D.   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  I discussed with her that her symptoms should slowly improve over the next few weeks since she is now approximately 6 weeks out from her radiation therapy.  Plan: She will follow-up with Dr. Adela Glimpse. ed later this month.  At some point she will undergo repeat imaging.  She will keep her already scheduled follow-up in our department in early March in particular to keep close follow-up with her esophageal issues.    ____________________________________  Blair Promise, PhD, MD   This document serves as a record of services personally performed by Gery Pray, MD. It was  created on his behalf by Roney Mans, a trained medical scribe. The creation of this record is based on the scribe's personal observations and the provider's statements to them. This document has been checked and approved by the attending provider.

## 2022-01-03 NOTE — Progress Notes (Signed)
[name] is here today for follow up post radiation to the lung.  Lung Side: right  Completed radiation treatment on: 11/13/2021  Does the patient complain of any of the following: Pain:6/10 right lung pain is constant and dull, esophagus burns Shortness of breath w/wo exertion: shortness of breath with exertion Cough: productive cough with brown phlegm. Sometimes unable to cough it up and it sits in chest. Hemoptysis: yes Pain with swallowing: yes Swallowing/choking concerns: difficulty swallowing, gets choked or gets stuck in esophagus Appetite: good appetite but unable to eat Energy Level: fair Post radiation skin Changes: none, has healed    Additional comments if applicable: nothing of note  Vitals:   01/03/22 1523  BP: (!) 125/92  Pulse: (!) 126  Resp: 20  Temp: 97.6 F (36.4 C)  SpO2: 98%  Weight: 153 lb 9.6 oz (69.7 kg)  Height: 5\' 8"  (1.727 m)

## 2022-01-07 ENCOUNTER — Other Ambulatory Visit: Payer: Self-pay

## 2022-01-07 ENCOUNTER — Encounter (HOSPITAL_COMMUNITY): Payer: Self-pay

## 2022-01-07 ENCOUNTER — Emergency Department (HOSPITAL_COMMUNITY): Payer: Medicaid Other

## 2022-01-07 ENCOUNTER — Other Ambulatory Visit (HOSPITAL_COMMUNITY): Payer: Self-pay

## 2022-01-07 ENCOUNTER — Emergency Department (HOSPITAL_COMMUNITY)
Admission: EM | Admit: 2022-01-07 | Discharge: 2022-01-07 | Disposition: A | Discharge status: Home / Self Care | Payer: Medicaid Other | Attending: Emergency Medicine | Admitting: Emergency Medicine

## 2022-01-07 ENCOUNTER — Emergency Department (HOSPITAL_COMMUNITY): Payer: Medicaid Other | Admitting: Anesthesiology

## 2022-01-07 ENCOUNTER — Encounter (HOSPITAL_COMMUNITY)
Admission: EM | Disposition: A | Discharge status: Home / Self Care | Payer: Self-pay | Source: Home / Self Care | Attending: Emergency Medicine

## 2022-01-07 ENCOUNTER — Emergency Department (EMERGENCY_DEPARTMENT_HOSPITAL): Payer: Medicaid Other | Admitting: Anesthesiology

## 2022-01-07 DIAGNOSIS — Z923 Personal history of irradiation: Secondary | ICD-10-CM | POA: Diagnosis not present

## 2022-01-07 DIAGNOSIS — R0789 Other chest pain: Secondary | ICD-10-CM | POA: Insufficient documentation

## 2022-01-07 DIAGNOSIS — F17210 Nicotine dependence, cigarettes, uncomplicated: Secondary | ICD-10-CM | POA: Diagnosis not present

## 2022-01-07 DIAGNOSIS — Z7901 Long term (current) use of anticoagulants: Secondary | ICD-10-CM | POA: Diagnosis not present

## 2022-01-07 DIAGNOSIS — K209 Esophagitis, unspecified without bleeding: Secondary | ICD-10-CM

## 2022-01-07 DIAGNOSIS — N289 Disorder of kidney and ureter, unspecified: Secondary | ICD-10-CM | POA: Diagnosis not present

## 2022-01-07 DIAGNOSIS — K208 Other esophagitis without bleeding: Secondary | ICD-10-CM

## 2022-01-07 DIAGNOSIS — J449 Chronic obstructive pulmonary disease, unspecified: Secondary | ICD-10-CM

## 2022-01-07 DIAGNOSIS — K2210 Ulcer of esophagus without bleeding: Secondary | ICD-10-CM | POA: Insufficient documentation

## 2022-01-07 DIAGNOSIS — Z9221 Personal history of antineoplastic chemotherapy: Secondary | ICD-10-CM | POA: Insufficient documentation

## 2022-01-07 DIAGNOSIS — Z7951 Long term (current) use of inhaled steroids: Secondary | ICD-10-CM | POA: Insufficient documentation

## 2022-01-07 DIAGNOSIS — R000 Tachycardia, unspecified: Secondary | ICD-10-CM | POA: Insufficient documentation

## 2022-01-07 DIAGNOSIS — E876 Hypokalemia: Secondary | ICD-10-CM | POA: Insufficient documentation

## 2022-01-07 DIAGNOSIS — C3401 Malignant neoplasm of right main bronchus: Secondary | ICD-10-CM | POA: Insufficient documentation

## 2022-01-07 DIAGNOSIS — R112 Nausea with vomiting, unspecified: Secondary | ICD-10-CM | POA: Insufficient documentation

## 2022-01-07 DIAGNOSIS — Z95828 Presence of other vascular implants and grafts: Secondary | ICD-10-CM | POA: Insufficient documentation

## 2022-01-07 DIAGNOSIS — R1310 Dysphagia, unspecified: Secondary | ICD-10-CM | POA: Diagnosis not present

## 2022-01-07 DIAGNOSIS — K222 Esophageal obstruction: Secondary | ICD-10-CM

## 2022-01-07 DIAGNOSIS — R1110 Vomiting, unspecified: Secondary | ICD-10-CM

## 2022-01-07 HISTORY — PX: ESOPHAGOGASTRODUODENOSCOPY (EGD) WITH PROPOFOL: SHX5813

## 2022-01-07 LAB — BASIC METABOLIC PANEL
Anion gap: 9 (ref 5–15)
BUN: 10 mg/dL (ref 6–20)
CO2: 20 mmol/L — ABNORMAL LOW (ref 22–32)
Calcium: 9.6 mg/dL (ref 8.9–10.3)
Chloride: 106 mmol/L (ref 98–111)
Creatinine, Ser: 1.06 mg/dL — ABNORMAL HIGH (ref 0.44–1.00)
GFR, Estimated: >60 mL/min (ref >60)
Glucose, Bld: 105 mg/dL — ABNORMAL HIGH (ref 70–99)
Potassium: 3.1 mmol/L — ABNORMAL LOW (ref 3.5–5.1)
Sodium: 135 mmol/L (ref 135–145)

## 2022-01-07 LAB — CBC
HCT: 36.1 % (ref 36.0–46.0)
Hemoglobin: 12.1 g/dL (ref 12.0–15.0)
MCH: 32.9 pg (ref 26.0–34.0)
MCHC: 33.5 g/dL (ref 30.0–36.0)
MCV: 98.1 fL (ref 80.0–100.0)
Platelets: 218 10*3/uL (ref 150–400)
RBC: 3.68 MIL/uL — ABNORMAL LOW (ref 3.87–5.11)
RDW: 19.7 % — ABNORMAL HIGH (ref 11.5–15.5)
WBC: 6.6 10*3/uL (ref 4.0–10.5)
nRBC: 0 % (ref 0.0–0.2)

## 2022-01-07 LAB — PROTIME-INR
INR: 1.1 (ref 0.8–1.2)
Prothrombin Time: 13.8 seconds (ref 11.4–15.2)

## 2022-01-07 LAB — MAGNESIUM: Magnesium: 1.4 mg/dL — ABNORMAL LOW (ref 1.7–2.4)

## 2022-01-07 LAB — TROPONIN I (HIGH SENSITIVITY): Troponin I (High Sensitivity): 4 ng/L (ref <18)

## 2022-01-07 LAB — APTT: aPTT: 42 seconds — ABNORMAL HIGH (ref 24–36)

## 2022-01-07 LAB — HEPARIN LEVEL (UNFRACTIONATED): Heparin Unfractionated: >1.1 IU/mL — ABNORMAL HIGH (ref 0.30–0.70)

## 2022-01-07 SURGERY — ESOPHAGOGASTRODUODENOSCOPY (EGD) WITH PROPOFOL
Anesthesia: Monitor Anesthesia Care

## 2022-01-07 MED ORDER — FENTANYL CITRATE PF 50 MCG/ML IJ SOSY
50.0000 ug | PREFILLED_SYRINGE | Freq: Once | INTRAMUSCULAR | Status: AC
  Administered 2022-01-07: 50 ug via INTRAVENOUS
  Filled 2022-01-07: qty 1

## 2022-01-07 MED ORDER — PROPOFOL 500 MG/50ML IV EMUL
INTRAVENOUS | Status: DC | PRN
  Administered 2022-01-07: 200 ug/kg/min via INTRAVENOUS

## 2022-01-07 MED ORDER — LIDOCAINE 2% (20 MG/ML) 5 ML SYRINGE
INTRAMUSCULAR | Status: DC | PRN
  Administered 2022-01-07: 60 mg via INTRAVENOUS

## 2022-01-07 MED ORDER — LACTATED RINGERS IV BOLUS
1000.0000 mL | Freq: Once | INTRAVENOUS | Status: AC
  Administered 2022-01-07: 1000 mL via INTRAVENOUS

## 2022-01-07 MED ORDER — FAMOTIDINE IN NACL 20-0.9 MG/50ML-% IV SOLN
20.0000 mg | Freq: Once | INTRAVENOUS | Status: AC
  Administered 2022-01-07: 20 mg via INTRAVENOUS
  Filled 2022-01-07: qty 50

## 2022-01-07 MED ORDER — POTASSIUM CHLORIDE 10 MEQ/100ML IV SOLN: 10.0000 meq | INTRAVENOUS | Status: DC

## 2022-01-07 MED ORDER — FENTANYL CITRATE (PF) 100 MCG/2ML IJ SOLN
INTRAMUSCULAR | Status: DC | PRN
Start: 2022-01-07 — End: 2022-01-07
  Administered 2022-01-07: 50 ug via INTRAVENOUS

## 2022-01-07 MED ORDER — DEXMEDETOMIDINE (PRECEDEX) IN NS 20 MCG/5ML (4 MCG/ML) IV SYRINGE
PREFILLED_SYRINGE | INTRAVENOUS | Status: AC
  Filled 2022-01-07: qty 20

## 2022-01-07 MED ORDER — HEPARIN (PORCINE) 25000 UT/250ML-% IV SOLN
1200.0000 [IU]/h | INTRAVENOUS | Status: DC
  Filled 2022-01-07: qty 250

## 2022-01-07 MED ORDER — MAGNESIUM SULFATE 2 GM/50ML IV SOLN
2.0000 g | Freq: Once | INTRAVENOUS | Status: AC
  Administered 2022-01-07: 2 g via INTRAVENOUS
  Filled 2022-01-07: qty 50

## 2022-01-07 MED ORDER — DEXMEDETOMIDINE (PRECEDEX) IN NS 20 MCG/5ML (4 MCG/ML) IV SYRINGE
PREFILLED_SYRINGE | INTRAVENOUS | Status: DC | PRN
  Administered 2022-01-07: 8 ug via INTRAVENOUS
  Administered 2022-01-07: 4 ug via INTRAVENOUS

## 2022-01-07 MED ORDER — APIXABAN 5 MG PO TABS
10.0000 mg | ORAL_TABLET | Freq: Two times a day (BID) | ORAL | Status: DC
Start: 2022-01-07 — End: 2022-01-07
  Administered 2022-01-07: 10 mg via ORAL
  Filled 2022-01-07: qty 2

## 2022-01-07 MED ORDER — APIXABAN 5 MG PO TABS: 5.0000 mg | ORAL_TABLET | Freq: Two times a day (BID) | ORAL | Status: DC

## 2022-01-07 MED ORDER — IOHEXOL 350 MG/ML SOLN
100.0000 mL | Freq: Once | INTRAVENOUS | Status: AC | PRN
  Administered 2022-01-07: 100 mL via INTRAVENOUS

## 2022-01-07 MED ORDER — PROPOFOL 10 MG/ML IV BOLUS
INTRAVENOUS | Status: DC | PRN
  Administered 2022-01-07 (×2): 30 mg via INTRAVENOUS
  Administered 2022-01-07 (×2): 20 mg via INTRAVENOUS

## 2022-01-07 MED ORDER — PANTOPRAZOLE SODIUM 40 MG PO TBEC
40.0000 mg | DELAYED_RELEASE_TABLET | Freq: Two times a day (BID) | ORAL | Status: DC
  Administered 2022-01-07: 40 mg via ORAL
  Filled 2022-01-07: qty 1

## 2022-01-07 MED ORDER — OXYCODONE-ACETAMINOPHEN 5-325 MG PO TABS
1.0000 | ORAL_TABLET | Freq: Four times a day (QID) | ORAL | 0 refills | Status: DC | PRN
  Filled 2022-01-07: qty 15, 4d supply, fill #0

## 2022-01-07 MED ORDER — MIDAZOLAM HCL 5 MG/5ML IJ SOLN
INTRAMUSCULAR | Status: DC | PRN
  Administered 2022-01-07: 2 mg via INTRAVENOUS

## 2022-01-07 MED ORDER — POTASSIUM CHLORIDE 10 MEQ/100ML IV SOLN
10.0000 meq | INTRAVENOUS | Status: DC
  Administered 2022-01-07 (×2): 10 meq via INTRAVENOUS
  Filled 2022-01-07 (×3): qty 100

## 2022-01-07 MED ORDER — SUCRALFATE 1 GM/10ML PO SUSP: 1.0000 g | Freq: Three times a day (TID) | ORAL | Status: DC

## 2022-01-07 MED ORDER — ALUM & MAG HYDROXIDE-SIMETH 200-200-20 MG/5ML PO SUSP
30.0000 mL | Freq: Once | ORAL | Status: AC
  Administered 2022-01-07: 30 mL via ORAL
  Filled 2022-01-07: qty 30

## 2022-01-07 MED ORDER — FENTANYL CITRATE (PF) 100 MCG/2ML IJ SOLN
INTRAMUSCULAR | Status: AC
  Filled 2022-01-07: qty 2

## 2022-01-07 MED ORDER — LIDOCAINE VISCOUS HCL 2 % MT SOLN
15.0000 mL | Freq: Once | OROMUCOSAL | Status: AC
  Administered 2022-01-07: 15 mL via ORAL
  Filled 2022-01-07: qty 15

## 2022-01-07 MED ORDER — ONDANSETRON HCL 4 MG/2ML IJ SOLN
4.0000 mg | Freq: Once | INTRAMUSCULAR | Status: AC
Start: 2022-01-07 — End: 2022-01-07
  Administered 2022-01-07: 4 mg via INTRAVENOUS
  Filled 2022-01-07: qty 2

## 2022-01-07 MED ORDER — SODIUM CHLORIDE 0.9 % IV SOLN: INTRAVENOUS | Status: DC

## 2022-01-07 MED ORDER — HEPARIN BOLUS VIA INFUSION
2000.0000 [IU] | Freq: Once | INTRAVENOUS | Status: DC
  Filled 2022-01-07: qty 2000

## 2022-01-07 MED ORDER — POTASSIUM CHLORIDE 20 MEQ PO PACK
20.0000 meq | PACK | Freq: Two times a day (BID) | ORAL | Status: DC
  Administered 2022-01-07: 20 meq via ORAL
  Filled 2022-01-07: qty 1

## 2022-01-07 MED ORDER — MIDAZOLAM HCL 2 MG/2ML IJ SOLN
INTRAMUSCULAR | Status: AC
  Filled 2022-01-07: qty 2

## 2022-01-07 MED ORDER — OXYCODONE-ACETAMINOPHEN 5-325 MG PO TABS
2.0000 | ORAL_TABLET | Freq: Once | ORAL | Status: AC
  Administered 2022-01-07: 2 via ORAL
  Filled 2022-01-07: qty 2

## 2022-01-07 MED ORDER — SODIUM CHLORIDE (PF) 0.9 % IJ SOLN
INTRAMUSCULAR | Status: AC
  Filled 2022-01-07: qty 50

## 2022-01-07 SURGICAL SUPPLY — 15 items

## 2022-01-07 NOTE — Progress Notes (Addendum)
ANTICOAGULATION CONSULT NOTE - Initial Consult  Pharmacy Consult for Heparin Indication:  VTE treatment  Allergies  Allergen Reactions   Naproxen Nausea Only   Bactrim [Sulfamethoxazole-Trimethoprim] Nausea Only    Patient Measurements: Height: 5\' 8"  (172.7 cm) Weight: 69.7 kg (153 lb 9.6 oz) IBW/kg (Calculated) : 63.9 Heparin Dosing Weight: TBW  Vital Signs: Temp: 97.8 F (36.6 C) (02/13 0828) Temp Source: Oral (02/13 0828) BP: 114/84 (02/13 1015) Pulse Rate: 92 (02/13 1015)  Labs: Recent Labs    01/07/22 0834 01/07/22 0900 01/07/22 0901  HGB 12.1  --   --   HCT 36.1  --   --   PLT 218  --   --   APTT  --  42*  --   LABPROT  --  13.8  --   INR  --  1.1  --   HEPARINUNFRC  --   --  >1.10*  CREATININE 1.06*  --   --   TROPONINIHS 4  --   --     Estimated Creatinine Clearance: 64.1 mL/min (A) (by C-G formula based on SCr of 1.06 mg/dL (H)).   Medical History: Past Medical History:  Diagnosis Date   Ankle sprain    left   Anxiety    treated for panic attacks in the past.   Asthma    COPD (chronic obstructive pulmonary disease) ([place])    Depression    History of radiation therapy    Right lung- 09/26/21-11/13/21- Dr. Gery Pray   Irregular heart rate    Lung cancer [facility])    Renal disorder    kidney infections    Medications:  (Not in a hospital admission)  Scheduled:  Infusions:   Assessment: 23 yoF presented to ED with CP, SOB, uncontrolled N/V.  PMH is significant for lung cancer, DVT of RUE associated with previous PICC line on Eliquis, and radiation esophagitis.  Pharmacy was initially consulted to dose Heparin while Eliquis on hold, however, now GI ok to resume Eliquis.   PTA apixaban starter pack prescribed on 2/3, not started until 2/12.  Doses taken so far: 10mg  on 2/12 at 9am, 2/12 at 8pm (threw up that dose).  She would be scheduled to change from 10mg  BID x7d to the 5mg  BID dosing on 01/13/22.    Baseline coags: Heparin level > 1.1,  likely false elevation from DOAC.  APTT 42 CBC:  Hgb and Plt WNL   Goal of Therapy:  Heparin level 0.3-0.7 units/ml aPTT 66-102 seconds Monitor platelets by anticoagulation protocol: Yes   Plan:  Apixaban 10mg  PO BID x6 days, then 5mg  PO BID Dosage remains stable and need for further dosage adjustment appears unlikely at present.  Pharmacy will sign off at this time.  Please reconsult if a change in clinical status warrants re-evaluation of dosage.   Gretta Arab PharmD, BCPS Clinical Pharmacist [facility] main pharmacy [phone] 01/07/2022 2:08 PM

## 2022-01-07 NOTE — Anesthesia Postprocedure Evaluation (Signed)
Anesthesia Post Note  Patient: [name]  Procedure(s) Performed: ESOPHAGOGASTRODUODENOSCOPY (EGD) WITH PROPOFOL     Patient location during evaluation: PACU Anesthesia Type: MAC Level of consciousness: awake and alert Pain management: pain level controlled Vital Signs Assessment: post-procedure vital signs reviewed and stable Respiratory status: spontaneous breathing, nonlabored ventilation, respiratory function stable and patient connected to nasal cannula oxygen Cardiovascular status: stable and blood pressure returned to baseline Postop Assessment: no apparent nausea or vomiting Anesthetic complications: no   No notable events documented.  Last Vitals:  Vitals:   01/07/22 1310 01/07/22 1320  BP: (!) 84/58 109/72  Pulse: 96 86  Resp: 19 15  Temp:    SpO2: 96% 95%    Last Pain:  Vitals:   01/07/22 1320  TempSrc:   PainSc: 7                  Tremon Sainvil S

## 2022-01-07 NOTE — ED Provider Notes (Signed)
[facility] DEPT Provider Note   CSN: [id number] Arrival date & time: 01/07/22  0820     History  Chief Complaint  Patient presents with   throat pain   Emesis    [name] is a 51 y.o. female.   Emesis  51 year old febrile with a medical history significant for COPD, asthma, depression, DVT of the right upper extremity associated with a previous PICC line, on Xarelto, dysphagia associated with radiation esophagitis, stage III lung cancer who presents to the emergency department with right-sided chest pain, shortness of breath, uncontrolled nausea and vomiting.  The patient states that she has had problems with food getting stuck with an esophageal stricture noted on recent endoscopy with associated esophagitis.  She had been doing well up until Friday when she started feeling as if food was stuck and she had difficulty swallowing and keeping liquids down.  She has been unable to keep or medicine down.  She has since developed right-sided dull chest pain, associated with shortness of breath, some pleuritic component to it.  She endorses chills, no fevers.  She has had multiple episodes of NBNB emesis since Friday.  She feels dehydrated.  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  Home Medications Prior to Admission medications   Medication Sig Start Date End Date Taking? Authorizing Provider  albuterol (PROVENTIL HFA;VENTOLIN HFA) 108 (90 Base) MCG/ACT inhaler Inhale 2 puffs into the lungs every 6 (six) hours as needed for wheezing or shortness of breath. 03/13/17  Yes Hairston, Maylon Peppers, FNP  APIXABAN (ELIQUIS) VTE STARTER PACK (10MG  AND 5MG ) Take as directed on package: start with two-5mg  tablets twice daily for 7 days. On day 8, switch to one-5mg  tablet  twice daily. 12/27/21  Yes Barb Merino, MD  diphenhydramine-acetaminophen (TYLENOL PM) 25-500 MG TABS tablet Take 3 tablets by mouth at bedtime as needed (sleep).   Yes [provider]  HYDROmorphone (DILAUDID) 2 MG tablet Take 1 tablet (2 mg total) by mouth every 4 (four) hours as needed for severe pain. 12/27/21  Yes Barb Merino, MD  ondansetron (ZOFRAN ODT) 8 MG disintegrating tablet Dissolve 1 tablet (8 mg total) by mouth every 8 (eight) hours as needed for nausea or vomiting. 10/11/21  Yes Heilingoetter, Cassandra L, PA-C  oxyCODONE-acetaminophen (PERCOCET/ROXICET) 5-325 MG tablet Take 1 tablet by mouth every 6 hours as needed for severe pain. 01/07/22  Yes Regan Lemming, MD  pantoprazole (PROTONIX) 40 MG tablet Take 1 tablet (40 mg total) by mouth 2 (two) times daily. 12/27/21 01/26/22 Yes Ghimire, Dante Gang, MD  sucralfate (CARAFATE) 1 GM/10ML suspension Take 10 mLs (1 g total) by mouth 4 (four) times daily -  with meals and at bedtime. 12/27/21  Yes Ghimire, Dante Gang, MD  nicotine (NICODERM CQ - DOSED IN MG/24 HOURS) 21 mg/24hr patch Place 1 patch (21 mg total) onto the skin daily. Patient not taking: Reported on 01/07/2022 12/28/21   Barb Merino, MD      Allergies    Naproxen and Bactrim [sulfamethoxazole-trimethoprim]    Review of Systems   Review of Systems  Gastrointestinal:  Positive for vomiting.  All other systems reviewed and are negative.  Physical Exam Updated Vital Signs BP (!) 118/94 (BP Location: Right Arm)    Pulse (!) 102    Temp 98.5 F (36.9 C) (  Oral)    Resp 18    Ht 5\' 8"  (1.727 m)    Wt 69.7 kg    LMP 04/24/2018    SpO2 99%    BMI 23.35 kg/m  Physical Exam Vitals and nursing note reviewed.  Constitutional:      General: She is not in acute distress.    Appearance: She is well-developed.  HENT:     Head: Normocephalic and atraumatic.     Mouth/Throat:     Mouth: Mucous membranes are dry.  Eyes:     Conjunctiva/sclera: Conjunctivae normal.     Pupils: Pupils  are equal, round, and reactive to light.  Cardiovascular:     Rate and Rhythm: Regular rhythm. Tachycardia present.     Pulses: Normal pulses.  Pulmonary:     Effort: Pulmonary effort is normal. No respiratory distress.     Breath sounds: Normal breath sounds.  Abdominal:     General: There is no distension.     Palpations: Abdomen is soft.     Tenderness: There is no abdominal tenderness. There is no guarding.  Musculoskeletal:        General: No swelling, deformity or signs of injury.     Cervical back: Neck supple.  Skin:    General: Skin is warm and dry.     Capillary Refill: Capillary refill takes less than 2 seconds.     Findings: No lesion or rash.  Neurological:     General: No focal deficit present.     Mental Status: She is alert. Mental status is at baseline.  Psychiatric:        Mood and Affect: Mood normal.    ED Results / Procedures / Treatments   Labs (all labs ordered are listed, but only abnormal results are displayed) Labs Reviewed  BASIC METABOLIC PANEL - Abnormal; Notable for the following components:      Result Value   Potassium 3.1 (*)    CO2 20 (*)    Glucose, Bld 105 (*)    Creatinine, Ser 1.06 (*)    All other components within normal limits  CBC - Abnormal; Notable for the following components:   RBC 3.68 (*)    RDW 19.7 (*)    All other components within normal limits  APTT - Abnormal; Notable for the following components:   aPTT 42 (*)    All other components within normal limits  HEPARIN LEVEL (UNFRACTIONATED) - Abnormal; Notable for the following components:   Heparin Unfractionated >1.10 (*)    All other components within normal limits  MAGNESIUM - Abnormal; Notable for the following components:   Magnesium 1.4 (*)    All other components within normal limits  PROTIME-INR  TROPONIN I (HIGH SENSITIVITY)  TROPONIN I (HIGH SENSITIVITY)    EKG EKG Interpretation  Date/Time:  Monday January 07 2022 08:43:52 EST Ventricular Rate:   109 PR Interval:  168 QRS Duration: 102 QT Interval:  341 QTC Calculation: 457 R Axis:   84 Text Interpretation: Sinus tachycardia Borderline repolarization abnormality Confirmed by Regan Lemming ([id number]) on 01/07/2022 9:25:47 AM  Radiology DG Chest 2 View  Result Date: 01/07/2022 CLINICAL DATA:  Chest pain.  History of lung cancer EXAM: CHEST - 2 VIEW COMPARISON:  01/01/2022 FINDINGS: The heart size and mediastinal contours are within normal limits. Mildly hyperinflated lungs. No focal airspace consolidation, pleural effusion, or pneumothorax. Callus formation related to healing right seventh rib fracture is noted. IMPRESSION: No active cardiopulmonary disease. Electronically Signed  By: Davina Poke D.O.   On: 01/07/2022 09:07   CT Angio Chest PE W and/or Wo Contrast  Result Date: 01/07/2022 CLINICAL DATA:  PE suspected, history of lung cancer EXAM: CT ANGIOGRAPHY CHEST WITH CONTRAST TECHNIQUE: Multidetector CT imaging of the chest was performed using the standard protocol during bolus administration of intravenous contrast. Multiplanar CT image reconstructions and MIPs were obtained to evaluate the vascular anatomy. RADIATION DOSE REDUCTION: This exam was performed according to the departmental dose-optimization program which includes automated exposure control, adjustment of the mA and/or kV according to patient size and/or use of iterative reconstruction technique. CONTRAST:  149mL OMNIPAQUE IOHEXOL 350 MG/ML SOLN COMPARISON:  01/01/2022 FINDINGS: Cardiovascular: Satisfactory opacification of the pulmonary arteries to the segmental level. No evidence of pulmonary embolism. Normal heart size. No pericardial effusion. Aortic atherosclerosis. Mediastinum/Nodes: Unchanged post treatment appearance of soft tissue about the right hilum and pretracheal stations without residual discrete lymphadenopathy (series 5, image 96). Thyroid gland, trachea, and esophagus demonstrate no significant findings.  Lungs/Pleura: Moderate centrilobular emphysema. Unchanged post treatment appearance of a spiculated nodule of the suprahilar right upper lobe measuring approximately 1.1 x 0.8 cm (series 6, image 45). No pleural effusion or pneumothorax. Upper Abdomen: No acute abnormality. Musculoskeletal: No chest wall abnormality. No acute osseous findings. Review of the MIP images confirms the above findings. IMPRESSION: 1. Negative examination for pulmonary embolism. 2. Unchanged post treatment appearance of a spiculated nodule of the suprahilar right upper lobe. 3. Emphysema. Aortic Atherosclerosis (ICD10-I70.0) and Emphysema (ICD10-J43.9). Electronically Signed   By: Delanna Ahmadi M.D.   On: 01/07/2022 11:41    Procedures Procedures    Medications Ordered in ED Medications  pantoprazole (PROTONIX) EC tablet 40 mg (40 mg Oral Given 01/07/22 1447)  sucralfate (CARAFATE) 1 GM/10ML suspension 1 g (has no administration in time range)  apixaban (ELIQUIS) tablet 10 mg (10 mg Oral Given 01/07/22 1446)    Followed by  apixaban (ELIQUIS) tablet 5 mg (has no administration in time range)  potassium chloride (KLOR-CON) packet 20 mEq (20 mEq Oral Given 01/07/22 1447)  lactated ringers bolus 1,000 mL (0 mLs Intravenous Stopped 01/07/22 1532)  ondansetron (ZOFRAN) injection 4 mg (4 mg Intravenous Given 01/07/22 0951)  fentaNYL (SUBLIMAZE) injection 50 mcg (50 mcg Intravenous Given 01/07/22 1014)  famotidine (PEPCID) IVPB 20 mg premix (0 mg Intravenous Stopped 01/07/22 1102)  magnesium sulfate IVPB 2 g 50 mL (0 g Intravenous Stopped 01/07/22 1119)  sodium chloride (PF) 0.9 % injection (  Given by Other 01/07/22 1102)  iohexol (OMNIPAQUE) 350 MG/ML injection 100 mL (100 mLs Intravenous Contrast Given 01/07/22 1110)  alum & mag hydroxide-simeth (MAALOX/MYLANTA) 200-200-20 MG/5ML suspension 30 mL (30 mLs Oral Given 01/07/22 1412)    And  lidocaine (XYLOCAINE) 2 % viscous mouth solution 15 mL (15 mLs Oral Given 01/07/22 1413)   oxyCODONE-acetaminophen (PERCOCET/ROXICET) 5-325 MG per tablet 2 tablet (2 tablets Oral Given 01/07/22 1510)    ED Course/ Medical Decision Making/ A&P Clinical Course as of 01/07/22 1532  Mon Jan 07, 2022  1040 Potassium(!): 3.1 [JL]  1041 Magnesium(!): 1.4 [JL]    Clinical Course User Index [JL] Regan Lemming, MD                           Medical Decision Making Amount and/or Complexity of Data Reviewed Labs: ordered. Decision-making details documented in ED Course. Radiology: ordered.  Risk OTC drugs. Prescription drug management. Decision regarding hospitalization.   51 year old  febrile with a medical history significant for COPD, asthma, depression, DVT of the right upper extremity associated with a previous PICC line, on Xarelto, dysphagia associated with radiation esophagitis, stage III lung cancer who presents to the emergency department with right-sided chest pain, shortness of breath, uncontrolled nausea and vomiting.  The patient states that she has had problems with food getting stuck with an esophageal stricture noted on recent endoscopy with associated esophagitis.  She had been doing well up until Friday when she started feeling as if food was stuck and she had difficulty swallowing and keeping liquids down.  She has been unable to keep or medicine down.  She has since developed right-sided dull chest pain, associated with shortness of breath, some pleuritic component to it.  She endorses chills, no fevers.  She has had multiple episodes of NBNB emesis since Friday.  She feels dehydrated.  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  On arrival, the patient was afebrile, temperature 97.8, tachycardic pulse 136, not tachypneic, hemodynamically stable, BP 114/92, saturating 100% on  room air.  Patient had dry mucous membranes on physical exam in the setting of nausea and vomiting and a food impaction sensation.  She has a history of esophageal strictures and esophagitis on recent EGD and hospitalization.  Concern for repeat food impaction resulting in inability to tolerate oral intake and subsequent dehydration.  Lower concern for sepsis at this time.  IV access was obtained on patient arrival and the patient was administered an IV fluid bolus with subsequent improvement in her tachycardia from 136-92 on repeat assessment.  Additionally, the patient has a known DVT in the right upper extremity for which she is supposed to be on anticoagulation but has not been able to keep her anticoagulant pill down for the past 3 days.  Patient was started on heparin on arrival.  I spoke with with Dr. Alessandra Bevels of [facility] gastroenterology to discuss the patient's sensation of food impaction, nausea, vomiting, inability tolerate oral intake.  He will plan for likely EGD today or tomorrow after evaluation of the patient.  The work-up was significant for hypomagnesemia to 1.4 which was replenished IV given her inability to tolerate oral intake.  Her CBC was without a leukocytosis or anemia with a WBC of 6.6 and hemoglobin of 12.1.  Chest x-ray was performed which revealed no acute cardiac or pulmonary abnormality which was reviewed by myself and radiology and I agree with radiology interpretation.  An EKG was performed which revealed no ST segment changes, sinus tachycardia noted with a ventricular rate of 109.  A CTA PE study was performed to rule out PE in the setting of tachycardia, pleuritic right-sided chest pain, known DVT and inability to keep down her anticoagulation for the last 3 days.  CTA was performed which was negative for acute PE.  She had a persistent spiculated pulmonary nodule in the right.  Her chest pain is likely due to her lung cancer.  No evidence for pneumonia on CT.  The  patient was taken for EGD and was found to have severe esophagitis which also could be contributing to her chest discomfort.  She was found to have moderate esophageal stenosis with a stricture in the midesophagus however GI was able to advance the scope without any resistance.  Her esophagus was not dilated  as she is on anticoagulation.  GI recommendations included twice daily PPI, Carafate 4 times a day, initiating full liquid diet and advance as tolerated, no further work-up.  Okay to resume Eliquis.  Patient was found to be hypomagnesemic to 1.4 and hypokalemic to 3.1.  These electrolyte abnormalities were replenished IV.  Hospitalist medicine was consulted for admission in the setting of dehydration, hypovolemia, multiple electrolyte abnormalities, nausea and vomiting in the setting of esophageal stricture.  Did discuss with the patient the need for inpatient hospitalization for IV fluid resuscitation, IV electrolyte replenishment, however, following EGD, the patient was able to tolerate oral viscous lidocaine and Maalox and was drinking tea comfortably without nausea or vomiting.  She did receive IV electrolyte replenishment in the ED with 2 g of IV magnesium and runs of potassium IV.  She was administered Klor-Con packet for potassium replenishment.  She was aware of the need to slowly advance her diet per gastroenterology recommendations.  She appears symptomatically improved following the above interventions.  On reassessment, the patient was tolerating oral intake with no episodes of emesis in the ED.  She is comfortable with the plan for continued outpatient management with continued advancement of a mechanical soft diet.  Patient prescribed a short course of opiates for pain control in the setting of her cancer and esophagitis.  Vitally stable and ready for discharge.   Final Clinical Impression(s) / ED Diagnoses Final diagnoses:  Esophagitis  Hypomagnesemia  Hypokalemia  Vomiting,  unspecified vomiting type, unspecified whether nausea present  Esophageal stricture    Rx / DC Orders ED Discharge Orders          Ordered    oxyCODONE-acetaminophen (PERCOCET/ROXICET) 5-325 MG tablet  Every 6 hours PRN        01/07/22 1514              Regan Lemming, MD 01/07/22 1533

## 2022-01-07 NOTE — Progress Notes (Signed)
Anticoagulation Brief Note:  Patient going for endoscopy today. Instructed by Andree Coss, PA-C to hold heparin drip at this time and reference GI procedure note for instructions on when to resume post-procedure.   Orders discontinued for now.  Lenis Noon, PharmD 01/07/22 11:41 AM

## 2022-01-07 NOTE — Op Note (Signed)
[facility] Patient Name: [name] Procedure Date: 01/07/2022 MRN: [id number] Attending MD: Otis Brace , MD Date of Birth: Nov 05, 1971 CSN: [id number] Age: 51 Admit Type: Emergency Department Procedure:                Upper GI endoscopy Indications:              Dysphagia, suspected Foreign body in the esophagus Providers:                Otis Brace, MD, Jaci Carrel, RN, Cherylynn Ridges, Technician, April Carter CRNA Referring MD:              Medicines:                Sedation Administered by an Anesthesia Professional Complications:            No immediate complications. Estimated Blood Loss:     Estimated blood loss was minimal. Procedure:                Pre-Anesthesia Assessment:                           - Prior to the procedure, a History and Physical                            was performed, and patient medications and                            allergies were reviewed. The patient's tolerance of                            previous anesthesia was also reviewed. The risks                            and benefits of the procedure and the sedation                            options and risks were discussed with the patient.                            All questions were answered, and informed consent                            was obtained. Prior Anticoagulants: The patient has                            taken Eliquis (apixaban), last dose was 1 day prior                            to procedure. ASA Grade Assessment: III - A patient                            with severe systemic disease. After reviewing the  risks and benefits, the patient was deemed in                            satisfactory condition to undergo the procedure.                           After obtaining informed consent, the endoscope was                            passed under direct vision. Throughout the                             procedure, the patient's blood pressure, pulse, and                            oxygen saturations were monitored continuously. The                            [id number]-[id number] ([id number]) Olympus endoscope was introduced                            through the mouth, and advanced to the second part                            of duodenum. The upper GI endoscopy was                            accomplished without difficulty. The patient                            tolerated the procedure well. Scope In: Scope Out: Findings:      LA Grade D (one or more mucosal breaks involving at least 75% of       esophageal circumference) esophagitis with no bleeding was found in the       middle third of the esophagus.      One benign-appearing, intrinsic moderate (circumferential scarring or       stenosis; an endoscope may pass) stenosis was found 25 to 30 cm from the       incisors. This stenosis measured 5 cm (in length). The stenosis was       traversed.      No gross lesions were noted in the entire examined stomach.      The cardia and gastric fundus were normal on retroflexion.      The duodenal bulb, first portion of the duodenum and second portion of       the duodenum were normal. Impression:               - LA Grade D erosive esophagitis with no bleeding.                           - Benign-appearing esophageal stenosis.                           - No gross lesions in the stomach.                           -  Normal duodenal bulb, first portion of the                            duodenum and second portion of the duodenum.                           - No specimens collected. Moderate Sedation:      Moderate (conscious) sedation was personally administered by an       anesthesia professional. The following parameters were monitored: oxygen       saturation, heart rate, blood pressure, and response to care. Recommendation:           - Transport patient to ER.                           - Pureed diet.                            - Continue present medications. Procedure Code(s):        --- Professional ---                           [phone], Esophagogastroduodenoscopy, flexible,                            transoral; diagnostic, including collection of                            specimen(s) by brushing or washing, when performed                            (separate procedure) Diagnosis Code(s):        --- Professional ---                           K20.80, Other esophagitis without bleeding                           K22.2, Esophageal obstruction                           R13.10, Dysphagia, unspecified                           T18.108A, Unspecified foreign body in esophagus                            causing other injury, initial encounter CPT copyright 2019 [employer]. All rights reserved. The codes documented in this report are preliminary and upon coder review may  be revised to meet current compliance requirements. Otis Brace, MD Otis Brace, MD 01/07/2022 12:51:58 PM Number of Addenda: 0

## 2022-01-07 NOTE — Anesthesia Preprocedure Evaluation (Signed)
Anesthesia Evaluation  Patient identified by MRN, date of birth, ID band Patient awake    Reviewed: Allergy & Precautions, NPO status , Patient's Chart, lab work & pertinent test results  Airway Mallampati: II  TM Distance: >3 FB Neck ROM: Full    Dental no notable dental hx.    Pulmonary COPD, Current Smoker,  [place] Lung Ca   Pulmonary exam normal breath sounds clear to auscultation       Cardiovascular negative cardio ROS Normal cardiovascular exam Rhythm:Regular Rate:Normal     Neuro/Psych negative neurological ROS  negative psych ROS   GI/Hepatic negative GI ROS, Neg liver ROS,   Endo/Other  negative endocrine ROS  Renal/GU Renal InsufficiencyRenal disease  negative genitourinary   Musculoskeletal negative musculoskeletal ROS (+)   Abdominal   Peds negative pediatric ROS (+)  Hematology negative hematology ROS (+)   Anesthesia Other Findings   Reproductive/Obstetrics negative OB ROS                             Anesthesia Physical Anesthesia Plan  ASA: 3  Anesthesia Plan: MAC   Post-op Pain Management: Minimal or no pain anticipated   Induction: Intravenous  PONV Risk Score and Plan: 2 and Propofol infusion and Treatment may vary due to age or medical condition  Airway Management Planned: Nasal Cannula  Additional Equipment:   Intra-op Plan:   Post-operative Plan:   Informed Consent: I have reviewed the patients History and Physical, chart, labs and discussed the procedure including the risks, benefits and alternatives for the proposed anesthesia with the patient or authorized representative who has indicated his/her understanding and acceptance.     Dental advisory given  Plan Discussed with: CRNA and Surgeon  Anesthesia Plan Comments:         Anesthesia Quick Evaluation

## 2022-01-07 NOTE — ED Triage Notes (Signed)
Patient reports that she has lung cancer. Patient is not currently receiving chemo or radiation.  Patient c/o right "lung pain" patient states the pain is worse with a deep breath.  Patient states she has vomited x 3 since yesterday.

## 2022-01-07 NOTE — Consult Note (Signed)
Referring Provider: Regan Lemming, MD Primary Care Physician:  [name], PA-C Primary Gastroenterologist:  Althia Forts   Reason for Consultation:  Dysphagia   HPI: [name] is a 51 y.o. female with medical history significant for small cell lung cancer, COPD, tobacco abuse presents for evaluation of dysphagia.   Patient states she has been having intermittent dysphagia since October 2022. States it does not occur all the time and may go a week or two without difficulty. Can happen with both solids and liquids, though mainly thicker solids. Last radiation treatment was over one month ago, last chem treatment was 3 weeks ago.    Patient had EGD on 12/26/2021 which revealed a benign-appearing esophageal stenosis and food in the middle third of the esophagus, removed successfully.  Patient states she was doing better following discharge and her symptoms were improved, but her symptoms of dysphagia returned after about a week.  She ate yesterday morning and says food stayed down but she still feels like she has food stuck retrosternally, above her stomach, and this is causing pain.  She tried taking medications last night but ended up regurgitating/vomiting medications and water.   Last bowel movement 2 days ago, normal, without melena or hematochezia.  Patient denies hematemesis, coffee-ground emesis.   States her father has "stomach problems." Reports history of colonoscopy at age 27 for abdominal pain that turned out to be an ovarian issue. Patient has used tobacco daily for 40 years, sometimes reaching 3 packs per day.  She states she has cut down to less than 1 pack/day but does continue to smoke.  Has not consumed alcohol since cancer diagnosis. Denies NSAID use.    Patient is on Eliquis, last dose morning of 01/06/22.       Past Medical History:  Diagnosis Date   Ankle sprain      left   Anxiety      treated for panic attacks in the past.   Asthma     COPD (chronic  obstructive pulmonary disease) (HCC)     Depression     History of radiation therapy      Right lung- 09/26/21-11/13/21- Dr. Gery Pray   Irregular heart rate     Lung cancer [facility])     Renal disorder      kidney infections           Past Surgical History:  Procedure Laterality Date   APPENDECTOMY       BRONCHIAL BIOPSY   09/21/2021    Procedure: BRONCHIAL BIOPSIES;  Surgeon: Collene Gobble, MD;  Location: [place];  Service: Pulmonary;;   BRONCHIAL BRUSHINGS   09/21/2021    Procedure: BRONCHIAL BRUSHINGS;  Surgeon: Collene Gobble, MD;  Location: [place];  Service: Pulmonary;;   BRONCHIAL NEEDLE ASPIRATION BIOPSY   09/21/2021    Procedure: BRONCHIAL NEEDLE ASPIRATION BIOPSIES;  Surgeon: Collene Gobble, MD;  Location: [facility] ENDOSCOPY;  Service: Pulmonary;;   ESOPHAGOGASTRODUODENOSCOPY N/A 12/26/2021    Procedure: ESOPHAGOGASTRODUODENOSCOPY (EGD);  Surgeon: Clarene Essex, MD;  Location: [facility];  Service: Endoscopy;  Laterality: N/A;   FOREIGN BODY REMOVAL   12/26/2021    Procedure: FOREIGN BODY REMOVAL;  Surgeon: Clarene Essex, MD;  Location: [facility] ENDOSCOPY;  Service: Endoscopy;;   IR CHEST FLUORO   12/27/2021   LUMBAR LAMINECTOMY N/A 05/13/2018    Procedure: LEFT LUMBAR FOUR-FIVE MICRODISCECTOMY;  Surgeon: Marybelle Killings, MD;  Location: [place];  Service: Orthopedics;  Laterality: N/A;   TUBAL LIGATION  TUBAL LIGATION   1998   VIDEO BRONCHOSCOPY WITH ENDOBRONCHIAL ULTRASOUND N/A 09/21/2021    Procedure: VIDEO BRONCHOSCOPY WITH ENDOBRONCHIAL ULTRASOUND;  Surgeon: Collene Gobble, MD;  Location: [place] ENDOSCOPY;  Service: Pulmonary;  Laterality: N/A;             Prior to Admission medications   Medication Sig Start Date End Date Taking? Authorizing Provider  albuterol (PROVENTIL HFA;VENTOLIN HFA) 108 (90 Base) MCG/ACT inhaler Inhale 2 puffs into the lungs every 6 (six) hours as needed for wheezing or shortness of breath. 03/13/17   Yes Hairston, Maylon Peppers, FNP  APIXABAN [facility])  VTE STARTER PACK (10MG  AND 5MG ) Take as directed on package: start with two-5mg  tablets twice daily for 7 days. On day 8, switch to one-5mg  tablet twice daily. 12/27/21   Yes Barb Merino, MD  diphenhydramine-acetaminophen (TYLENOL PM) 25-500 MG TABS tablet Take 3 tablets by mouth at bedtime as needed (sleep).     Yes [provider]  HYDROmorphone (DILAUDID) 2 MG tablet Take 1 tablet (2 mg total) by mouth every 4 (four) hours as needed for severe pain. 12/27/21     Barb Merino, MD  magnesium oxide (MAG-OX) 400 (240 Mg) MG tablet Take 1 tablet (400 mg total) by mouth 3 (three) times daily. 10/30/21     Curt Bears, MD  nicotine (NICODERM CQ - DOSED IN MG/24 HOURS) 21 mg/24hr patch Place 1 patch (21 mg total) onto the skin daily. 12/28/21     Barb Merino, MD  ondansetron (ZOFRAN ODT) 8 MG disintegrating tablet Dissolve 1 tablet (8 mg total) by mouth every 8 (eight) hours as needed for nausea or vomiting. 10/11/21     Heilingoetter, Cassandra L, PA-C  pantoprazole (PROTONIX) 40 MG tablet Take 1 tablet (40 mg total) by mouth 2 (two) times daily. 12/27/21 01/26/22   Barb Merino, MD  predniSONE (DELTASONE) 10 MG tablet 3 tabs daily for 2 days 2 tabs daily for 2 days 1 tab daily for 2 days 12/27/21     Barb Merino, MD  sucralfate (CARAFATE) 1 GM/10ML suspension Take 10 mLs (1 g total) by mouth 4 (four) times daily -  with meals and at bedtime. 12/27/21     Barb Merino, MD      Scheduled Meds: Continuous Infusions:  famotidine (PEPCID) IV 20 mg (01/07/22 1020)   magnesium sulfate bolus IVPB 2 g (01/07/22 1019)    PRN Meds:.        Allergies as of 01/07/2022 - Review Complete 01/07/2022  Allergen Reaction Noted   Naproxen Nausea Only 09/10/2021   Bactrim [sulfamethoxazole-trimethoprim] Nausea Only 04/06/2012           Family History  Problem Relation Age of Onset   Hypertension Mother     Hypertension Father     Heart disease Father        Social History          Socioeconomic History   Marital status: Legally Separated      Spouse name: Not on file   Number of children: Not on file   Years of education: Not on file   Highest education level: Not on file  Occupational History   Not on file  Tobacco Use   Smoking status: Every Day      Packs/day: 0.25      Years: 30.00      Pack years: 7.50      Types: Cigarettes   Smokeless tobacco: Never  Vaping Use   Vaping Use:  Some days   Substances: Flavoring  Substance and Sexual Activity   Alcohol use: Not Currently      Comment: rare   Drug use: Not Currently      Types: Marijuana      Comment: occ-   Sexual activity: Not on file  Other Topics Concern   Not on file  Social History Narrative   Not on file    Social Determinants of Health    Financial Resource Strain: Not on file  Food Insecurity: Not on file  Transportation Needs: Not on file  Physical Activity: Not on file  Stress: Not on file  Social Connections: Not on file  Intimate Partner Violence: Not on file      Review of Systems: Review of Systems  Constitutional:  Negative for chills and fever.  HENT:  Negative for sore throat.   Eyes:  Negative for discharge and redness.  Respiratory:  Negative for cough, hemoptysis and stridor.   Cardiovascular:  Negative for chest pain and leg swelling.  Gastrointestinal:  Positive for vomiting (one episode of vomiting/regurgitation yesterday while taking pills with water). Negative for abdominal pain, blood in stool, constipation, diarrhea, heartburn, melena and nausea.       Dysphagia  Genitourinary:  Negative for dysuria and urgency.  Musculoskeletal:  Negative for falls and myalgias.  Skin:  Negative for itching and rash.  Neurological:  Negative for seizures and loss of consciousness.  Endo/Heme/Allergies:  Negative for environmental allergies and polydipsia.  Psychiatric/Behavioral:  Negative for hallucinations and memory loss.      Physical Exam: Physical  Exam Constitutional:      General: She is not in acute distress.    Appearance: Normal appearance.  HENT:     Head: Normocephalic and atraumatic.     Right Ear: External ear normal.     Left Ear: External ear normal.     Nose: Nose normal.     Mouth/Throat:     Mouth: Mucous membranes are moist.     Pharynx: Oropharynx is clear.  Eyes:     General: No scleral icterus.    Conjunctiva/sclera: Conjunctivae normal.  Cardiovascular:     Rate and Rhythm: Normal rate and regular rhythm.     Pulses: Normal pulses.     Heart sounds: Normal heart sounds.  Pulmonary:     Effort: Pulmonary effort is normal.     Breath sounds: Normal breath sounds.  Abdominal:     General: Bowel sounds are normal.     Palpations: Abdomen is soft.     Tenderness: There is no abdominal tenderness. There is no guarding.  Musculoskeletal:     Cervical back: Normal range of motion and neck supple.     Right lower leg: No edema.     Left lower leg: No edema.  Skin:    General: Skin is warm and dry.  Neurological:     General: No focal deficit present.     Mental Status: She is alert and oriented to person, place, and time.  Psychiatric:        Mood and Affect: Mood normal.        Behavior: Behavior normal.      Vital signs:     Vitals:    01/07/22 1000 01/07/22 1015  BP: 106/80 114/84  Pulse: 86 92  Resp: 17 11  Temp:      SpO2: 100% 99%          GI:  Lab Results: Recent Labs (  last 2 labs)      Recent Labs    01/07/22 0834  WBC 6.6  HGB 12.1  HCT 36.1  PLT 218      BMET Recent Labs (last 2 labs)      Recent Labs    01/07/22 0834  NA 135  K 3.1*  CL 106  CO2 20*  GLUCOSE 105*  BUN 10  CREATININE 1.06*  CALCIUM 9.6      LFT Recent Labs (last 2 labs)   No results for input(s): PROT, ALBUMIN, AST, ALT, ALKPHOS, BILITOT, BILIDIR, IBILI in the last 72 hours.   PT/INR Recent Labs (last 2 labs)      Recent Labs    01/07/22 0900  LABPROT 13.8  INR 1.1           Studies/Results:  Imaging Results (Last 48 hours)  DG Chest 2 View   Result Date: 01/07/2022 CLINICAL DATA:  Chest pain.  History of lung cancer EXAM: CHEST - 2 VIEW COMPARISON:  01/01/2022 FINDINGS: The heart size and mediastinal contours are within normal limits. Mildly hyperinflated lungs. No focal airspace consolidation, pleural effusion, or pneumothorax. Callus formation related to healing right seventh rib fracture is noted. IMPRESSION: No active cardiopulmonary disease. Electronically Signed   By: Davina Poke D.O.   On: 01/07/2022 09:07       Impression: Dysphagia, with known esophageal stenosis - S/p EGD on 12/26/2021 with removal of food in the middle third of the esophagus. Benign appearing esophageal stenosis, NOT dilated. - BUN 105, Cr 1.06 - Hgb 12.1   COPD  Small cell lung cancer  Current smoker - Less than one pack per day   Plan: Plan for EGD today. I thoroughly discussed the procedures to include nature, alternatives, benefits, and risks including but not limited to bleeding, perforation, infection, anesthesia/cardiac and pulmonary complications. Patient provides understanding and gave verbal consent to proceed. NPO until after procedure. Continue supportive care. [facility] will follow.        LOS: 0 days    Angelique Holm  PA-C 01/07/2022, 10:35 AM   Contact #  [phone]          Note Details  Author Hillary Bow File Time 01/07/2022 11:14 AM  Author Type Physician Assistant Certified Status Incomplete  Last Editor Hillary Bow Service Gastroenterology  [facility] Acct # [id number] Admit Date 01/07/2022

## 2022-01-07 NOTE — Brief Op Note (Signed)
01/07/2022  12:52 PM  PATIENT:  [name]  51 y.o. female  PRE-OPERATIVE DIAGNOSIS:  Food impaction  POST-OPERATIVE DIAGNOSIS:  esophagitis, no food impaction noted, esophageal stricture  PROCEDURE:  Procedure(s): ESOPHAGOGASTRODUODENOSCOPY (EGD) WITH PROPOFOL (N/A)  SURGEON:  Surgeon(s) and Role:    * Olena Willy, MD - Primary  Findings ----------- -EGD negative for food impaction -Showed severe LA grade D esophagitis with moderate esophageal stricture in the midesophagus.  I was able to advance scope without any resistance.  Dilation was not performed as she was on Eliquis.  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  Call us back if needed  Otis Brace MD, [place] 01/07/2022, 12:54 PM  Contact #  [phone]

## 2022-01-07 NOTE — Discharge Instructions (Addendum)
You were evaluated in the Emergency Department and after careful evaluation, we did not find any emergent condition requiring admission or further testing in the hospital.  Your exam/testing today was concerning for chest pain associated with your esophageal stricture and likely known lung cancer.  You had electrolyte abnormalities with low potassium and low magnesium which were replenished IV and orally in the ED.  Your EGD revealed a stricture and severe esophagitis.  Recommend continuing to push oral liquids and advancing to a mechanical soft diet as detailed by gastroenterology and their recommendations: Recommendations ----------------------- -Continue twice daily PPI and Carafate 4 times a day -Start full liquid diet and slowly advance to pure as tolerated -No further inpatient GI work-up planned -Okay to resume Eliquis from GI standpoint  Please return to the Emergency Department if you experience any worsening of your condition.  Thank you for allowing [place] to be a part of your care.

## 2022-01-07 NOTE — Transfer of Care (Signed)
Immediate Anesthesia Transfer of Care Note  Patient: [name]  Procedure(s) Performed: ESOPHAGOGASTRODUODENOSCOPY (EGD) WITH PROPOFOL  Patient Location: Endoscopy Unit  Anesthesia Type:MAC  Level of Consciousness: awake and alert   Airway & Oxygen Therapy: Patient Spontanous Breathing and Patient connected to face mask oxygen  Post-op Assessment: Report given to RN and Post -op Vital signs reviewed and stable  Post vital signs: Reviewed and stable  Last Vitals:  Vitals Value Taken Time  BP 87/54 01/07/22 1300  Temp    Pulse 96 01/07/22 1309  Resp 19 01/07/22 1309  SpO2 96 % 01/07/22 1309  Vitals shown include unvalidated device data.  Last Pain:  Vitals:   01/07/22 1300  TempSrc:   PainSc: 0-No pain         Complications: No notable events documented.

## 2022-01-09 ENCOUNTER — Encounter (HOSPITAL_COMMUNITY): Payer: Self-pay | Admitting: Gastroenterology

## 2022-01-10 ENCOUNTER — Other Ambulatory Visit: Payer: Self-pay | Admitting: Radiation Oncology

## 2022-01-11 ENCOUNTER — Telehealth: Payer: Self-pay

## 2022-01-11 NOTE — Telephone Encounter (Signed)
Fax received from [facility], Leave of Absence Coordinator for [employer] requesting If pt will continue to be placed off work. I have LM for [facility] requesting a return call. Pt has appt with Cassie on 01/17/22 and is not expected to continue to be off from work. It is okay to complete a note placing pt off until her 01/17/22 OV.  Note has been generated and faxed.

## 2022-01-16 ENCOUNTER — Ambulatory Visit: Payer: Medicaid Other | Admitting: Internal Medicine

## 2022-01-16 ENCOUNTER — Other Ambulatory Visit: Payer: Medicaid Other

## 2022-01-16 NOTE — Progress Notes (Deleted)
[facility] OFFICE PROGRESS NOTE  [name], PA-C [place] [address]  DIAGNOSIS: ***Extensive stage (T3, N2, M***) small cell lung cancer presented with right hilar/suprahilar mass with occlusion of the right upper lobe bronchus in addition to right paratracheal, right infra hilar and subcarinal lymphadenopathy as well as additional right upper lobe and right apical pulmonary nodules.  This was diagnosed in October 2022. She also has osseous metastatic disease ***  PRIOR THERAPY: Systemic chemotherapy with cisplatin 80 Mg/M2 on day 1 and etoposide 100 Mg/M2 on days 1, 2 and 3 every 3 weeks. This will be concurrent with radiotherapy under the care of Dr. Sondra Come. Status post 4 cycles. She also had concurrent radiation. Last dose of treatment on 12/06/21.    CURRENT THERAPY: ***   INTERVAL HISTORY: SERENITI WAN 51 y.o. female returns to the clinic today for follow-up visit accompanied by her mom.  The patient underwent chemo and radiation.  Her last dose of chemotherapy was on 12/06/2021.  The patient had a very challenging time with her chemotherapy as well as radiation.  Regarding her chemotherapy, the patient had significant pancytopenia with treatment.  She required G-CSF support.  She also had weakness, nausea, and vomiting which sometimes required IV fluids and IV antiemetics.  She also had some electrolyte derangements.  For radiation, the patient had radiation-induced esophagitis.  Patient was unable to tolerate viscous lidocaine and Carafate.  Reportedly textures and consistency of this caused emesis.  Patient was recently admitted to the hospital at the end of January 2023.  Imaging in the hospital showed significant radiation esophagitis and concern for aspiration pneumonia.  The patient underwent EGD which showed esophageal stricture and severe esophagitis.  She recently had a follow-up with Dr. Sondra Come.  She is currently taking probiotic?  And  Protonix, and Carafate.  Regarding her systemic treatment, the patient is feeling ***today.  She denies any fever, chills, ***.  She reports frequent night sweats.  She ***appetite and weight loss.  She sometimes has emesis related to being "choked up" on phlegm.  She reports baseline dyspnea exertion.  Denies any chest discomfort.  Denies any hemoptysis.  Nausea and vomiting?  She denies any diarrhea or constipation.  Denies any headache or visual changes.  The patient had a CTA while in the emergency room which we will use in place of her restaging CT scan of the chest.  The patient is here today for evaluation and to discuss the neck steps in her treatment.    MEDICAL HISTORY: Past Medical History:  Diagnosis Date   Ankle sprain    left   Anxiety    treated for panic attacks in the past.   Asthma    COPD (chronic obstructive pulmonary disease) ([place])    Depression    History of radiation therapy    Right lung- 09/26/21-11/13/21- Dr. Gery Pray   Irregular heart rate    Lung cancer [facility])    Renal disorder    kidney infections    ALLERGIES:  is allergic to naproxen and bactrim [sulfamethoxazole-trimethoprim].  MEDICATIONS:  Current Outpatient Medications  Medication Sig Dispense Refill   albuterol (PROVENTIL HFA;VENTOLIN HFA) 108 (90 Base) MCG/ACT inhaler Inhale 2 puffs into the lungs every 6 (six) hours as needed for wheezing or shortness of breath. 1 Inhaler 2   APIXABAN (ELIQUIS) VTE STARTER PACK (10MG  AND 5MG ) Take as directed on package: start with two-5mg  tablets twice daily for 7 days.  On day 8, switch to one-5mg  tablet twice daily. 1 each 0   diphenhydramine-acetaminophen (TYLENOL PM) 25-500 MG TABS tablet Take 3 tablets by mouth at bedtime as needed (sleep).     HYDROmorphone (DILAUDID) 2 MG tablet Take 1 tablet (2 mg total) by mouth every 4 (four) hours as needed for severe pain. 30 tablet 0   nicotine (NICODERM CQ - DOSED IN MG/24 HOURS) 21 mg/24hr patch Place 1 patch (21  mg total) onto the skin daily. (Patient not taking: Reported on 01/07/2022) 28 patch 0   ondansetron (ZOFRAN ODT) 8 MG disintegrating tablet Dissolve 1 tablet (8 mg total) by mouth every 8 (eight) hours as needed for nausea or vomiting. 30 tablet 2   oxyCODONE-acetaminophen (PERCOCET/ROXICET) 5-325 MG tablet Take 1 tablet by mouth every 6 hours as needed for severe pain. 15 tablet 0   pantoprazole (PROTONIX) 40 MG tablet Take 1 tablet (40 mg total) by mouth 2 (two) times daily. 60 tablet 0   sucralfate (CARAFATE) 1 GM/10ML suspension Take 10 mLs (1 g total) by mouth 4 (four) times daily -  with meals and at bedtime. 420 mL 0   No current facility-administered medications for this visit.    SURGICAL HISTORY:  Past Surgical History:  Procedure Laterality Date   APPENDECTOMY     BRONCHIAL BIOPSY  09/21/2021   Procedure: BRONCHIAL BIOPSIES;  Surgeon: Collene Gobble, MD;  Location: [facility] ENDOSCOPY;  Service: Pulmonary;;   BRONCHIAL BRUSHINGS  09/21/2021   Procedure: BRONCHIAL BRUSHINGS;  Surgeon: Collene Gobble, MD;  Location: [facility] ENDOSCOPY;  Service: Pulmonary;;   BRONCHIAL NEEDLE ASPIRATION BIOPSY  09/21/2021   Procedure: BRONCHIAL NEEDLE ASPIRATION BIOPSIES;  Surgeon: Collene Gobble, MD;  Location: [facility] ENDOSCOPY;  Service: Pulmonary;;   ESOPHAGOGASTRODUODENOSCOPY N/A 12/26/2021   Procedure: ESOPHAGOGASTRODUODENOSCOPY (EGD);  Surgeon: Clarene Essex, MD;  Location: [facility] ENDOSCOPY;  Service: Endoscopy;  Laterality: N/A;   ESOPHAGOGASTRODUODENOSCOPY (EGD) WITH PROPOFOL N/A 01/07/2022   Procedure: ESOPHAGOGASTRODUODENOSCOPY (EGD) WITH PROPOFOL;  Surgeon: Otis Brace, MD;  Location: [facility] ENDOSCOPY;  Service: Gastroenterology;  Laterality: N/A;   FOREIGN BODY REMOVAL  12/26/2021   Procedure: FOREIGN BODY REMOVAL;  Surgeon: Clarene Essex, MD;  Location: [facility] ENDOSCOPY;  Service: Endoscopy;;   IR CHEST FLUORO  12/27/2021   LUMBAR LAMINECTOMY N/A 05/13/2018   Procedure: LEFT LUMBAR FOUR-FIVE MICRODISCECTOMY;   Surgeon: Marybelle Killings, MD;  Location: [place];  Service: Orthopedics;  Laterality: N/A;   TUBAL LIGATION     TUBAL LIGATION  1998   VIDEO BRONCHOSCOPY WITH ENDOBRONCHIAL ULTRASOUND N/A 09/21/2021   Procedure: VIDEO BRONCHOSCOPY WITH ENDOBRONCHIAL ULTRASOUND;  Surgeon: Collene Gobble, MD;  Location: [place] ENDOSCOPY;  Service: Pulmonary;  Laterality: N/A;    REVIEW OF SYSTEMS:   Review of Systems  Constitutional: Negative for appetite change, chills, fatigue, fever and unexpected weight change.  HENT:   Negative for mouth sores, nosebleeds, sore throat and trouble swallowing.   Eyes: Negative for eye problems and icterus.  Respiratory: Negative for cough, hemoptysis, shortness of breath and wheezing.   Cardiovascular: Negative for chest pain and leg swelling.  Gastrointestinal: Negative for abdominal pain, constipation, diarrhea, nausea and vomiting.  Genitourinary: Negative for bladder incontinence, difficulty urinating, dysuria, frequency and hematuria.   Musculoskeletal: Negative for back pain, gait problem, neck pain and neck stiffness.  Skin: Negative for itching and rash.  Neurological: Negative for dizziness, extremity weakness, gait problem, headaches, light-headedness and seizures.  Hematological: Negative for adenopathy. Does not bruise/bleed easily.  Psychiatric/Behavioral: Negative for  confusion, depression and sleep disturbance. The patient is not nervous/anxious.     PHYSICAL EXAMINATION:  Last menstrual period 04/24/2018.  ECOG PERFORMANCE STATUS: {CHL ONC ECOG [id number]  Physical Exam  Constitutional: Oriented to person, place, and time and well-developed, well-nourished, and in no distress. No distress.  HENT:  Head: Normocephalic and atraumatic.  Mouth/Throat: Oropharynx is clear and moist. No oropharyngeal exudate.  Eyes: Conjunctivae are normal. Right eye exhibits no discharge. Left eye exhibits no discharge. No scleral icterus.  Neck: Normal range of motion.  Neck supple.  Cardiovascular: Normal rate, regular rhythm, normal heart sounds and intact distal pulses.   Pulmonary/Chest: Effort normal and breath sounds normal. No respiratory distress. No wheezes. No rales.  Abdominal: Soft. Bowel sounds are normal. Exhibits no distension and no mass. There is no tenderness.  Musculoskeletal: Normal range of motion. Exhibits no edema.  Lymphadenopathy:    No cervical adenopathy.  Neurological: Alert and oriented to person, place, and time. Exhibits normal muscle tone. Gait normal. Coordination normal.  Skin: Skin is warm and dry. No rash noted. Not diaphoretic. No erythema. No pallor.  Psychiatric: Mood, memory and judgment normal.  Vitals reviewed.  LABORATORY DATA: Lab Results  Component Value Date   WBC 6.6 01/07/2022   HGB 12.1 01/07/2022   HCT 36.1 01/07/2022   MCV 98.1 01/07/2022   PLT 218 01/07/2022      Chemistry      Component Value Date/Time   NA 135 01/07/2022 0834   NA 139 02/18/2017 0932   K 3.1 (L) 01/07/2022 0834   CL 106 01/07/2022 0834   CO2 20 (L) 01/07/2022 0834   BUN 10 01/07/2022 0834   BUN 11 02/18/2017 0932   CREATININE 1.06 (H) 01/07/2022 0834   CREATININE 0.80 12/04/2021 0754      Component Value Date/Time   CALCIUM 9.6 01/07/2022 0834   ALKPHOS 60 01/01/2022 1157   AST 13 (L) 01/01/2022 1157   AST 9 (L) 12/04/2021 0754   ALT 15 01/01/2022 1157   ALT 7 12/04/2021 0754   BILITOT 0.4 01/01/2022 1157   BILITOT 0.7 12/04/2021 0754       RADIOGRAPHIC STUDIES:  DG Chest 1 View  Result Date: 12/18/2021 CLINICAL DATA:  Increased shortness of breath EXAM: CHEST  1 VIEW COMPARISON:  12/18/2021 FINDINGS: The heart size and mediastinal contours are within normal limits. Both lungs are clear. The visualized skeletal structures are unremarkable. IMPRESSION: No active disease. Electronically Signed   By: Kathreen Devoid M.D.   On: 12/18/2021 10:08   DG Chest 2 View  Result Date: 01/07/2022 CLINICAL DATA:  Chest pain.   History of lung cancer EXAM: CHEST - 2 VIEW COMPARISON:  01/01/2022 FINDINGS: The heart size and mediastinal contours are within normal limits. Mildly hyperinflated lungs. No focal airspace consolidation, pleural effusion, or pneumothorax. Callus formation related to healing right seventh rib fracture is noted. IMPRESSION: No active cardiopulmonary disease. Electronically Signed   By: Davina Poke D.O.   On: 01/07/2022 09:07   DG Chest 2 View  Result Date: 01/01/2022 CLINICAL DATA:  Dysphagia.  History of lung cancer EXAM: CHEST - 2 VIEW COMPARISON:  12/27/2021 FINDINGS: The heart size and mediastinal contours are stable. Mildly hyperinflated lungs. No new focal airspace consolidation. No pleural effusion or pneumothorax. The visualized skeletal structures are unremarkable. IMPRESSION: No active cardiopulmonary disease. Electronically Signed   By: Davina Poke D.O.   On: 01/01/2022 12:12   CT Angio Chest PE W and/or Wo Contrast  Result Date: 01/07/2022 CLINICAL DATA:  PE suspected, history of lung cancer EXAM: CT ANGIOGRAPHY CHEST WITH CONTRAST TECHNIQUE: Multidetector CT imaging of the chest was performed using the standard protocol during bolus administration of intravenous contrast. Multiplanar CT image reconstructions and MIPs were obtained to evaluate the vascular anatomy. RADIATION DOSE REDUCTION: This exam was performed according to the departmental dose-optimization program which includes automated exposure control, adjustment of the mA and/or kV according to patient size and/or use of iterative reconstruction technique. CONTRAST:  170mL OMNIPAQUE IOHEXOL 350 MG/ML SOLN COMPARISON:  01/01/2022 FINDINGS: Cardiovascular: Satisfactory opacification of the pulmonary arteries to the segmental level. No evidence of pulmonary embolism. Normal heart size. No pericardial effusion. Aortic atherosclerosis. Mediastinum/Nodes: Unchanged post treatment appearance of soft tissue about the right hilum and  pretracheal stations without residual discrete lymphadenopathy (series 5, image 96). Thyroid gland, trachea, and esophagus demonstrate no significant findings. Lungs/Pleura: Moderate centrilobular emphysema. Unchanged post treatment appearance of a spiculated nodule of the suprahilar right upper lobe measuring approximately 1.1 x 0.8 cm (series 6, image 45). No pleural effusion or pneumothorax. Upper Abdomen: No acute abnormality. Musculoskeletal: No chest wall abnormality. No acute osseous findings. Review of the MIP images confirms the above findings. IMPRESSION: 1. Negative examination for pulmonary embolism. 2. Unchanged post treatment appearance of a spiculated nodule of the suprahilar right upper lobe. 3. Emphysema. Aortic Atherosclerosis (ICD10-I70.0) and Emphysema (ICD10-J43.9). Electronically Signed   By: Delanna Ahmadi M.D.   On: 01/07/2022 11:41   CT Angio Chest PE W and/or Wo Contrast  Result Date: 01/01/2022 CLINICAL DATA:  Pulmonary embolus suspected with high probability. Right-sided chest pain and dysphagia. EXAM: CT ANGIOGRAPHY CHEST WITH CONTRAST TECHNIQUE: Multidetector CT imaging of the chest was performed using the standard protocol during bolus administration of intravenous contrast. Multiplanar CT image reconstructions and MIPs were obtained to evaluate the vascular anatomy. RADIATION DOSE REDUCTION: This exam was performed according to the departmental dose-optimization program which includes automated exposure control, adjustment of the mA and/or kV according to patient size and/or use of iterative reconstruction technique. CONTRAST:  79mL OMNIPAQUE IOHEXOL 350 MG/ML SOLN COMPARISON:  12/20/2021 FINDINGS: Cardiovascular: There is moderately good opacification of the central and segmental pulmonary arteries. No focal filling defects. No evidence of significant pulmonary embolus. The right upper lobe pulmonary artery is narrowed extrinsically due to the mass lesion. This appearance is  unchanged since the prior study. Normal heart size. No pericardial effusions. Normal caliber thoracic aorta. No aortic dissection. Great vessel origins are patent. Mediastinum/Nodes: Thyroid gland is unremarkable. Thickening of the wall of the esophagus most prominent in the upper esophageal region. No significant dilatation. This could represent reflux disease or esophagitis. Radiation therapy could also have this appearance if within a port. Mildly enlarged right hilar lymph nodes at 1.3 cm diameter, unchanged. Lungs/Pleura: Severe diffuse emphysematous changes in the lungs. Dependent changes in the lung bases. Right suprahilar nodule measuring about 1.8 cm in diameter with associated scarring. This is consistent with known neoplasm and is unchanged. Focal area of vague airspace disease in the right upper lung posteriorly. This corresponds to an area of more dense consolidation on the previous study and is consistent with interval improvement. No pleural effusions. No pneumothorax. Upper Abdomen: No acute abnormalities demonstrated in the visualized upper abdomen. Musculoskeletal: No chest wall abnormality. No acute or significant osseous findings. Review of the MIP images confirms the above findings. IMPRESSION: 1. No evidence of significant pulmonary embolus. 2. Right suprahilar nodule and prominent right hilar lymph nodes  consistent with known neoplasm. Scarring likely represents post treatment reactions. 3. Extensive emphysematous changes throughout the lungs. 4. Resolving focal area of consolidation in the right upper lung posteriorly. 5. Persistent esophageal wall thickening which could indicate reflux disease, esophagitis, or radiation change. Electronically Signed   By: Lucienne Capers M.D.   On: 01/01/2022 19:06   CT Angio Chest Pulmonary Embolism (PE) W or WO Contrast  Result Date: 12/20/2021 CLINICAL DATA:  Shortness of breath. Cough. Tachycardia. DVT. History of lung cancer. EXAM: CT ANGIOGRAPHY  CHEST WITH CONTRAST TECHNIQUE: Multidetector CT imaging of the chest was performed using the standard protocol during bolus administration of intravenous contrast. Multiplanar CT image reconstructions and MIPs were obtained to evaluate the vascular anatomy. RADIATION DOSE REDUCTION: This exam was performed according to the departmental dose-optimization program which includes automated exposure control, adjustment of the mA and/or kV according to patient size and/or use of iterative reconstruction technique. CONTRAST:  29mL OMNIPAQUE IOHEXOL 350 MG/ML SOLN COMPARISON:  Radiograph yesterday. Chest CTA 6 days ago 12/24/2021, PET CT 10/12/2021 reviewed FINDINGS: Cardiovascular: Examination is diagnostic to the distal segmental level. No central pulmonary embolus. The subsegmental branches are not well assessed due to contrast bolus timing. The heart is normal in size. No pericardial effusion. Aortic atherosclerosis without dissection or acute aortic findings. Right upper extremity PICC tip in the lower SVC. There is contrast refluxing into the hepatic veins and IVC. Mediastinum/Nodes: Anterior paratracheal nodes measure up to 8 mm, not significantly changed from prior. There is a 10 mm right hilar lymph node. Circumferential wall thickening of the mid and upper esophagus which is mildly improved. The distal esophagus is patulous. Lungs/Pleura: Emphysema. Right suprahilar nodule measures 18 mm, series 6, image 60. There is circumferential wall thickening of the right upper lobe bronchus with intraluminal debris and subsequent bronchial narrowing. Well demarcated subpleural opacity in the right upper lobe, series 6, image 30, 15 mm and not significantly changed from prior exam and prior PET. Left upper lobe 5 mm nodule series 6, image 37, unchanged. There is debris within the right lower lobe bronchus with bronchial thickening and adjacent peribronchovascular opacities in the right lower lobe, for example series 6, image  82. Basilar assessment is limited by motion. No significant pleural effusion. Upper Abdomen: No acute findings. Musculoskeletal: Healing right anterior seventh and eighth rib fractures are unchanged. No new osseous findings. Review of the MIP images confirms the above findings. IMPRESSION: 1. No central pulmonary embolus. 2. There is contrast refluxing into the hepatic veins and IVC suggestive elevated right heart pressures. 3. Right suprahilar nodule measuring 18 mm, consistent with known malignancy. 4. There is circumferential wall thickening of the right upper lobe bronchus with intraluminal debris and subsequent bronchial narrowing. Right lower lobe bronchial thickening with intraluminal debris and adjacent peribronchovascular opacities in the right lower lobe, likely infectious or inflammatory. Given prominent esophageal thickening, the possibility of aspiration is raised. 5. Unchanged tiny left upper lobe nodule. Unchanged subpleural opacity at the right lung apex. 6. Emphysema. Aortic Atherosclerosis (ICD10-I70.0) and Emphysema (ICD10-J43.9). Electronically Signed   By: Keith Rake M.D.   On: 12/20/2021 17:54   IR Chest Fluoro  Result Date: 12/27/2021 INDICATION: Malfunctioning PICC line 51 year old female with history of indwelling right upper extremity PICC line with difficulty PICC line removal on the floor. IR requested to evaluate and removed under fluoroscopic guidance. EXAM: FLUOROSCOPIC GUIDED PICC LINE removal MEDICATIONS: None. CONTRAST:  None FLUOROSCOPY TIME:  0 minutes 18 seconds, 0 mGy COMPLICATIONS: None  immediate. TECHNIQUE: The procedure, risks, benefits, and alternatives were explained to the patient and informed written consent was obtained. The right upper extremity and external portion of the existing PICC line was prepped with chlorhexidine in a sterile fashion, and a sterile drape was applied covering the operative field. Maximum barrier sterile technique with sterile gowns and  gloves were used for the procedure. A timeout was performed prior to the initiation of the procedure. The PICC line was attempted to be removed however there was significant resistance and stretching of the external portion of the catheter with associated right upper extremity pain reported by the patient. The central portion of the catheter was unchanged with the tip in the right innominate vein. A 0.018 inch Nitrex wire was inserted via the PICC line which then was attempted to be removed over the wire which was also unsuccessful with similar symptoms. Ultrasound evaluation demonstrated the PICC line coursing through a short segment of the biceps musculature. Therefore, subdermal Local anesthesia was provided near the PICC line skin entry site with 1% lidocaine. A skin nick was made from the established skin nick at the catheter entry site. Gentle blunt dissection was performed deep to the skin entry site. With more force than usually required, the PICC line was then retracted and removed intact. A sterile bandage was applied and hemostasis was achieved. The patient tolerated procedure well without immediate complication. The procedure was performed with assistance by Soyla Dryer, NP. FINDINGS: Difficult to remove PICC line, likely secondary to intramuscular course into the brachial vein. IMPRESSION: Successful fluoroscopic and ultrasound-guided removal of indwelling right upper extremity PICC line. Ruthann Cancer, MD [facility] Electronically Signed   By: Ruthann Cancer M.D.   On: 12/27/2021 16:30   DG CHEST PORT 1 VIEW  Result Date: 12/27/2021 CLINICAL DATA:  PICC line placement EXAM: PORTABLE CHEST 1 VIEW COMPARISON:  12/18/2021 FINDINGS: Right-sided PICC line with the tip projecting over the SVC. No focal consolidation. No pleural effusion or pneumothorax. Heart and mediastinal contours are unremarkable. No acute osseous abnormality. IMPRESSION:  1. Right-sided PICC line with the tip projecting over the SVC. Electronically Signed   By: Kathreen Devoid M.D.   On: 12/27/2021 13:40   DG CHEST PORT 1 VIEW  Result Date: 12/19/2021 CLINICAL DATA:  Pneumonia EXAM: PORTABLE CHEST 1 VIEW COMPARISON:  Chest radiograph 12/19/2021 FINDINGS: There is a new right upper extremity PICC terminating at the cavoatrial junction. The cardiomediastinal silhouette is stable. There are patchy opacities in the medial right base, slightly increased from prior. There is no other focal airspace disease. There is no significant pleural effusion. There is no pneumothorax. There is no acute osseous abnormality. IMPRESSION: 1. New right upper extremity PICC with tip at the region of the cavoatrial junction. 2. Patchy opacities in the medial right base are slightly increased from prior and could reflect developing infection. Electronically Signed   By: Valetta Mole M.D.   On: 12/19/2021 08:04   DG CHEST PORT 1 VIEW  Result Date: 12/18/2021 CLINICAL DATA:  Shortness of breath EXAM: PORTABLE CHEST 1 VIEW COMPARISON:  12/17/2021 FINDINGS: Lungs are hyperinflated as can be seen with COPD. Hazy left lower lobe airspace disease which may reflect atelectasis versus pneumonia unchanged compared with the prior exam. No pleural effusion or pneumothorax. Heart and mediastinal contours are unremarkable. No acute osseous abnormality. IMPRESSION: Stable hazy left lower lobe airspace disease which may reflect atelectasis versus pneumonia. Electronically Signed   By: Elbert Ewings  Patel M.D.   On: 12/18/2021 08:40   DG CHEST PORT 1 VIEW  Result Date: 12/17/2021 CLINICAL DATA:  Nausea and shortness of breath. EXAM: PORTABLE CHEST 1 VIEW COMPARISON:  Radiographs and chest CTA 3 days ago 12/14/2021, FINDINGS: Developing patchy opacity at the left lung base, new from prior exam. There is increase some bronchial thickening. Normal heart size with unchanged mediastinal contours. No significant pleural effusion  or pneumothorax. IMPRESSION: 1. Developing patchy opacity at the left lung base over the last 3 days may be atelectasis or pneumonia. 2. Increased bronchial thickening. Electronically Signed   By: Keith Rake M.D.   On: 12/17/2021 17:35   DG Swallowing Func-Speech Pathology  Result Date: 12/24/2021 Table formatting from the original result was not included. Objective Swallowing Evaluation: Type of Study: MBS-Modified Barium Swallow Study  Patient Details Name: OTELIA HETTINGER MRN: [id number] Date of Birth: 05/04/1971 Today's Date: 12/24/2021 Time: SLP Start Time (ACUTE ONLY): 1400 -SLP Stop Time (ACUTE ONLY): 1425 SLP Time Calculation (min) (ACUTE ONLY): 25 min Past Medical History: Past Medical History: Diagnosis Date  Ankle sprain   left  Anxiety   treated for panic attacks in the past.  Asthma   COPD (chronic obstructive pulmonary disease) (HCC)   Depression   History of radiation therapy   Right lung- 09/26/21-11/13/21- Dr. Gery Pray  Irregular heart rate   Lung cancer [facility])   Renal disorder   kidney infections Past Surgical History: Past Surgical History: Procedure Laterality Date  APPENDECTOMY    BRONCHIAL BIOPSY  09/21/2021  Procedure: BRONCHIAL BIOPSIES;  Surgeon: Collene Gobble, MD;  Location: [place];  Service: Pulmonary;;  BRONCHIAL BRUSHINGS  09/21/2021  Procedure: BRONCHIAL BRUSHINGS;  Surgeon: Collene Gobble, MD;  Location: [place];  Service: Pulmonary;;  BRONCHIAL NEEDLE ASPIRATION BIOPSY  09/21/2021  Procedure: BRONCHIAL NEEDLE ASPIRATION BIOPSIES;  Surgeon: Collene Gobble, MD;  Location: [facility];  Service: Pulmonary;;  LUMBAR LAMINECTOMY N/A 05/13/2018  Procedure: LEFT LUMBAR FOUR-FIVE MICRODISCECTOMY;  Surgeon: Marybelle Killings, MD;  Location: [place];  Service: Orthopedics;  Laterality: N/A;  TUBAL LIGATION    TUBAL LIGATION  1998  VIDEO BRONCHOSCOPY WITH ENDOBRONCHIAL ULTRASOUND N/A 09/21/2021  Procedure: VIDEO BRONCHOSCOPY WITH ENDOBRONCHIAL ULTRASOUND;  Surgeon: Collene Gobble, MD;  Location: [place] [facility];  Service: Pulmonary;  Laterality: N/A; HPI: Pt is a 51 yo female adm to [facility] on 1/21 with some increase in dyspnea.  Per medical notes pt has lung cancer undergoing chemoradiation. "She was noted to have vomited w/ carafate and lidocaine viscous; 1/20  w/ cc: shortness of breath and chest pain. Completed chemo about 8d prior w/ post-chemo course c/b nausea, vomiting and poor po intake.1/22 diet advanced to full liq. Got another unit PLTs. Had some hemoptysis w/ cough 1/23 new fever, rapid response called for increased WOB and tachycardia. Pt anxious. PCXR w/ possible LL airspace disease. 1/24 remained tachycardic, still having CP and shoulder pain, still having nausea but no emesis. Developed progressive respiratory distress w/ loud audible expiratory wheezing. Pulse oximetry did not drop significantly however O2 was rapidly titrated up w/ little improvement in WOB. PCCM called emergently to bedside. Marked Upper airway expiratory wheezing noted w/ significant reduction during phonation efforts and when reassured." Swallow eval ordered.  Pt admits to "choking" on liquids since her cancer diagnosis in October 2022,  Subjective: pleasant, but this morning started having increasing difficulty with swallowing and currently unable to even swallow water  Recommendations for follow up therapy are one  component of a multi-disciplinary discharge planning process, led by the attending physician.  Recommendations may be updated based on patient status, additional functional criteria and insurance authorization. Assessment / Plan / Recommendation Clinical Impressions 12/24/2021 Clinical Impression Patient presents with a normal oral phase of swallow, mild pharyngeal phase dysphagia and a severe esophageal dysphagia. First bolus tested was small cup sip of thin liquid barium which resulted in mild swallow initiation delay to vallecular sinus but no penetration or aspiration. Second bolus tested was  1/2 spoonful of puree texture. During pharyngeal transit of puree, thin liquid barium could be seen refluxing up into pharynx. After initial swallow, puree and likely thin liquid barium mix almost immediately refluxed back up into pharynx. Patient was able regurgitate barium by 'hocking' it up after it had moved into vallecular sinus. Esophageal sweep revealed stasis of barium in esophagus just below UES. Barium continued to reflux from below UES and into pharynx. SLP concluded test at this time and patient was able to continue to regurgitate barium and expectorate into emesis bag. SLP informed MD of results. At this time, although patient did not exhibit any aspiration or penetration of thin liquids or puree solids, her esophageal phase of swallow is severely impaired. SLP recommending NPO except for ice chips, water or other liquid sips for pleasure. Patient is understanding and at this time, PO's are not giving her any pleasure. SLP to continue to follow patient with plan to consult more with MD regarding POC moving foward. SLP Visit Diagnosis Dysphagia, pharyngoesophageal phase (R13.14) Attention and concentration deficit following -- Frontal lobe and executive function deficit following -- Impact on safety and function --   Treatment Recommendations 12/24/2021 Treatment Recommendations Therapy as outlined in treatment plan below   Prognosis 12/24/2021 Prognosis for Safe Diet Advancement Guarded Barriers to Reach Goals Severity of deficits Barriers/Prognosis Comment -- Diet Recommendations 12/24/2021 SLP Diet Recommendations NPO;Ice chips PRN after oral care;Other (Comment) Liquid Administration via Cup Medication Administration Via alternative means Compensations Small sips/bites Postural Changes --   Other Recommendations 12/24/2021 Recommended Consults -- Oral Care Recommendations Oral care BID;Patient independent with oral care Other Recommendations -- Follow Up Recommendations Other (comment) Assistance  recommended at discharge None Functional Status Assessment Patient has had a recent decline in their functional status and demonstrates the ability to make significant improvements in function in a reasonable and predictable amount of time. Frequency and Duration  12/24/2021 Speech Therapy Frequency (ACUTE ONLY) min 2x/week Treatment Duration 1 week   Oral Phase 12/24/2021 Oral Phase WFL Oral - Pudding Teaspoon -- Oral - Pudding Cup -- Oral - Honey Teaspoon -- Oral - Honey Cup -- Oral - Nectar Teaspoon -- Oral - Nectar Cup -- Oral - Nectar Straw -- Oral - Thin Teaspoon -- Oral - Thin Cup -- Oral - Thin Straw -- Oral - Puree -- Oral - Mech Soft -- Oral - Regular -- Oral - Multi-Consistency -- Oral - Pill -- Oral Phase - Comment --  Pharyngeal Phase 12/24/2021 Pharyngeal Phase Impaired Pharyngeal- Pudding Teaspoon -- Pharyngeal -- Pharyngeal- Pudding Cup -- Pharyngeal -- Pharyngeal- Honey Teaspoon -- Pharyngeal -- Pharyngeal- Honey Cup -- Pharyngeal -- Pharyngeal- Nectar Teaspoon -- Pharyngeal -- Pharyngeal- Nectar Cup -- Pharyngeal -- Pharyngeal- Nectar Straw -- Pharyngeal -- Pharyngeal- Thin Teaspoon -- Pharyngeal -- Pharyngeal- Thin Cup Delayed swallow initiation-vallecula Pharyngeal -- Pharyngeal- Thin Straw -- Pharyngeal -- Pharyngeal- Puree WFL Pharyngeal -- Pharyngeal- Mechanical Soft -- Pharyngeal -- Pharyngeal- Regular -- Pharyngeal -- Pharyngeal- Multi-consistency -- Pharyngeal -- Pharyngeal- Pill --  Pharyngeal -- Pharyngeal Comment --  Cervical Esophageal Phase  12/24/2021 Cervical Esophageal Phase Impaired Pudding Teaspoon -- Pudding Cup -- Honey Teaspoon -- Honey Cup -- Nectar Teaspoon -- Nectar Cup -- Nectar Straw -- Thin Teaspoon -- Thin Cup Esophageal backflow into the pharynx Thin Straw -- Puree Esophageal backflow into the pharynx Mechanical Soft -- Regular -- Multi-consistency -- Pill -- Cervical Esophageal Comment -- Sonia Baller, MA, CCC-SLP Speech Therapy                     ECHOCARDIOGRAM  COMPLETE  Result Date: 12/20/2021    ECHOCARDIOGRAM REPORT   Patient Name:   CHENIKA NEVILS Date of Exam: 12/20/2021 Medical Rec #:  [id number]      Height:       68.0 in Accession #:    [id number]     Weight:       151.7 lb Date of Birth:  [birth year]      BSA:          1.817 m Patient Age:    25 years       BP:           143/94 mmHg Patient Gender: F              HR:           117 bpm. Exam Location:  Inpatient Procedure: 2D Echo Indications:    Elevated brain natriuretic peptide level  History:        Patient has prior history of Echocardiogram examinations and                 Patient has no prior history of Echocardiogram examinations.                 COPD. Irregular heartrate.  Sonographer:    Arlyss Gandy Referring Phys: [place]  1. Left ventricular ejection fraction, by estimation, is 60 to 65%. The left ventricle has normal function. The left ventricle has no regional wall motion abnormalities. Left ventricular diastolic parameters were normal.  2. Right ventricular systolic function is normal. The right ventricular size is normal. Tricuspid regurgitation signal is inadequate for assessing PA pressure.  3. The pericardial effusion is anterior to the right ventricle.  4. The mitral valve is normal in structure. Trivial mitral valve regurgitation. No evidence of mitral stenosis.  5. The aortic valve is normal in structure. Aortic valve regurgitation is not visualized. No aortic stenosis is present.  6. The inferior vena cava is normal in size with <50% respiratory variability, suggesting right atrial pressure of 8 mmHg. FINDINGS  Left Ventricle: Left ventricular ejection fraction, by estimation, is 60 to 65%. The left ventricle has normal function. The left ventricle has no regional wall motion abnormalities. The left ventricular internal cavity size was normal in size. There is  no left ventricular hypertrophy. Left ventricular diastolic parameters were normal. Normal left  ventricular filling pressure. Right Ventricle: The right ventricular size is normal. No increase in right ventricular wall thickness. Right ventricular systolic function is normal. Tricuspid regurgitation signal is inadequate for assessing PA pressure. Left Atrium: Left atrial size was normal in size. Right Atrium: Right atrial size was normal in size. Pericardium: Trivial pericardial effusion is present. The pericardial effusion is anterior to the right ventricle. Mitral Valve: The mitral valve is normal in structure. Trivial mitral valve regurgitation. No evidence of mitral valve stenosis. Tricuspid Valve: The tricuspid valve is normal in  structure. Tricuspid valve regurgitation is not demonstrated. No evidence of tricuspid stenosis. Aortic Valve: The aortic valve is normal in structure. Aortic valve regurgitation is not visualized. No aortic stenosis is present. Aortic valve mean gradient measures 3.0 mmHg. Aortic valve peak gradient measures 6.0 mmHg. Aortic valve area, by VTI measures 2.03 cm. Pulmonic Valve: The pulmonic valve was normal in structure. Pulmonic valve regurgitation is trivial. No evidence of pulmonic stenosis. Aorta: The aortic root is normal in size and structure. Venous: The inferior vena cava is normal in size with less than 50% respiratory variability, suggesting right atrial pressure of 8 mmHg. IAS/Shunts: No atrial level shunt detected by color flow Doppler.  LEFT VENTRICLE PLAX 2D LVIDd:         4.40 cm   Diastology LVIDs:         3.60 cm   LV e' medial:    14.10 cm/s LV PW:         0.90 cm   LV E/e' medial:  8.5 LV IVS:        0.90 cm   LV e' lateral:   17.70 cm/s LVOT diam:     2.00 cm   LV E/e' lateral: 6.8 LV SV:         41 LV SV Index:   22 LVOT Area:     3.14 cm  RIGHT VENTRICLE             IVC RV Basal diam:  3.00 cm     IVC diam: 2.20 cm RV Mid diam:    3.60 cm RV S prime:     17.30 cm/s TAPSE (M-mode): 1.6 cm LEFT ATRIUM             Index        RIGHT ATRIUM          Index LA  diam:        3.20 cm 1.76 cm/m   RA Area:     9.01 cm LA Vol (A2C):   33.7 ml 18.55 ml/m  RA Volume:   16.80 ml 9.25 ml/m LA Vol (A4C):   37.0 ml 20.36 ml/m LA Biplane Vol: 35.3 ml 19.43 ml/m  AORTIC VALVE AV Area (Vmax):    1.89 cm AV Area (Vmean):   1.84 cm AV Area (VTI):     2.03 cm AV Vmax:           122.00 cm/s AV Vmean:          86.000 cm/s AV VTI:            0.201 m AV Peak Grad:      6.0 mmHg AV Mean Grad:      3.0 mmHg LVOT Vmax:         73.50 cm/s LVOT Vmean:        50.500 cm/s LVOT VTI:          0.130 m LVOT/AV VTI ratio: 0.65  AORTA Ao Root diam: 3.20 cm Ao Asc diam:  3.20 cm MITRAL VALVE MV Area (PHT): 3.77 cm     SHUNTS MV Decel Time: 201 msec     Systemic VTI:  0.13 m MV E velocity: 120.00 cm/s  Systemic Diam: 2.00 cm Fransico Him MD Electronically signed by Fransico Him MD Signature Date/Time: 12/20/2021/2:34:21 PM    Final    VAS [place] UPPER EXTREMITY VENOUS DUPLEX  Result Date: 12/21/2021 UPPER VENOUS STUDY  Patient Name:  Rolanda Jay  Date of Exam:  12/20/2021 Medical Rec #: [id number]       Accession #:    [id number] Date of Birth: 1971-03-03       Patient Gender: F Patient Age:   86 years Exam Location:  [facility] Procedure:      VAS [place] UPPER EXTREMITY VENOUS DUPLEX Referring Phys: A POWELL JR --------------------------------------------------------------------------------  Indications: Edema Other Indications: PICC line in RUE. Risk Factors: CA patient on chemotherapy. Comparison Study: No previous exams Performing Technologist: Jody Hill RVT, RDMS  Examination Guidelines: A complete evaluation includes B-mode imaging, spectral Doppler, color Doppler, and power Doppler as needed of all accessible portions of each vessel. Bilateral testing is considered an integral part of a complete examination. Limited examinations for reoccurring indications may be performed as noted.  Right Findings: +----------+------------+---------+-----------+----------+---------------------+  RIGHT       Compressible Phasicity Spontaneous Properties        Summary         +----------+------------+---------+-----------+----------+---------------------+  IJV            Full        Yes        Yes                                       +----------+------------+---------+-----------+----------+---------------------+  Subclavian     Full        Yes        Yes                                       +----------+------------+---------+-----------+----------+---------------------+  Axillary       Full        Yes        Yes                                       +----------+------------+---------+-----------+----------+---------------------+  Brachial       None        No         No                 Acute - one of paired                                                                  brachials        +----------+------------+---------+-----------+----------+---------------------+  Radial         Full                                                             +----------+------------+---------+-----------+----------+---------------------+  Ulnar          Full                                                             +----------+------------+---------+-----------+----------+---------------------+  Cephalic       Full                                                             +----------+------------+---------+-----------+----------+---------------------+  Basilic      Partial       Yes        Yes                        Acute          +----------+------------+---------+-----------+----------+---------------------+  Left Findings: +----------+------------+---------+-----------+----------+-------+  LEFT       Compressible Phasicity Spontaneous Properties Summary  +----------+------------+---------+-----------+----------+-------+  Subclavian     Full        Yes        Yes                         +----------+------------+---------+-----------+----------+-------+  Summary:  Right: Findings consistent with acute deep vein  thrombosis involving the right brachial veins. Findings consistent with acute superficial vein thrombosis involving the right basilic vein.  Left: No evidence of thrombosis in the subclavian.  *See table(s) above for measurements and observations.  Diagnosing physician: Jamelle Haring Electronically signed by Jamelle Haring on 12/21/2021 at 5:12:32 AM.    Final    [place] EKG SITE RITE  Result Date: 12/18/2021 If Site Rite image not attached, placement could not be confirmed due to current cardiac rhythm.  DG ESOPHAGUS W SINGLE CM (SOL OR THIN BA)  Result Date: 12/24/2021 CLINICAL DATA:  Abnormal esophagus on swallowing evaluation. EXAM: ESOPHOGRAM/BARIUM SWALLOW TECHNIQUE: Single contrast examination was performed using  thin barium. FLUOROSCOPY TIME:  Fluoroscopy Time:  24 seconds Radiation Exposure Index (if provided by the fluoroscopic device): 2.4 mGy Number of Acquired Spot Images: 0 COMPARISON:  CT of the chest of December 20, 2021. FINDINGS: Only a very limited esophagram could be performed. A small amount of barium contrast was swallowed which outlined debris within the esophagus proximal to an area of narrowing. Abrupt narrowing of the esophagus in the proximal to mid esophageal level corresponding to thickening seen on prior CT imaging. The patient was unable to continue for a complete esophageal evaluation due to this finding and vomited after swallowing only a small amount of contrast. 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 Endoscopic assessment is suggested for further evaluation/potential treatment and to exclude the less likely possibility of concomitant neoplasm. This exam was performed by Alfredia Ferguson, PA, and was supervised and interpreted by Zetta Bills Electronically Signed   By:  Zetta Bills M.D.   On: 12/24/2021 16:02     ASSESSMENT/PLAN:  This is a very pleasant 51 year old Caucasian female diagnosed with Extensive stage*** (T3, N2, M***) small cell lung cancer.  She presented with a right hilar/suprahilar mass with occlusion of the right upper lobe bronchus in addition to right paratracheal, right infrahilar, and subcarinal lymphadenopathy.  She also has right upper lobe and right apical pulmonary nodules.  She was diagnosed in October 2022. ***Bone mets***   The patient completed systemic chemotherapy with an 80 mg  per metered squared on days 1 and etoposide 100 mg per metered squared on days 1, 2, and 3 IV every 3 weeks.  She also completed the radiation portion of her treatment.  The patient status post 4 cycles.  Her last dose of treatment was on 12/06/2021.  The patient had a challenging time with treatment due to nausea and vomiting, weakness, and transfusion support.  She also had significant radiation esophagitis.  The patient was seen by Dr. Julien Nordmann today.  Dr. Julien Nordmann reviewed the CTA that the patient had performed in February 2023 as a restaging CT scan.  We also rediscussed the PET scan from November 2022 which showed ***.   Dr. Julien Nordmann had a lengthy discussion with the patient today about her current condition.  Dr. Julien Nordmann discussed that her condition has ***  Immunotherapy ***  ***   Follow up ***.   The patient was advised to call immediately if she has any concerning symptoms in the interval. The patient voices understanding of current disease status and treatment options and is in agreement with the current care plan. All questions were answered. The patient knows to call the clinic with any problems, questions or concerns. We can certainly see the patient much sooner if necessary  No orders of the defined types were placed in this encounter.    I spent {CHL ONC TIME VISIT - XTGGY:[id number]} counseling the patient face to face. The total time  spent in the appointment was {CHL ONC TIME VISIT - JJKKX:[id number]}.  Twana Wileman L Lirio Bach, PA-C 01/16/22

## 2022-01-17 ENCOUNTER — Other Ambulatory Visit: Payer: Self-pay | Admitting: Medical Oncology

## 2022-01-17 ENCOUNTER — Other Ambulatory Visit: Payer: Self-pay

## 2022-01-17 ENCOUNTER — Inpatient Hospital Stay (HOSPITAL_BASED_OUTPATIENT_CLINIC_OR_DEPARTMENT_OTHER): Payer: Medicaid Other | Admitting: Internal Medicine

## 2022-01-17 ENCOUNTER — Inpatient Hospital Stay: Payer: Medicaid Other | Attending: Radiation Oncology

## 2022-01-17 ENCOUNTER — Inpatient Hospital Stay: Payer: Medicaid Other

## 2022-01-17 VITALS — BP 117/87 | HR 137 | Temp 97.0°F | Resp 18 | Ht 68.0 in | Wt 145.0 lb

## 2022-01-17 DIAGNOSIS — C3490 Malignant neoplasm of unspecified part of unspecified bronchus or lung: Secondary | ICD-10-CM

## 2022-01-17 DIAGNOSIS — Z886 Allergy status to analgesic agent status: Secondary | ICD-10-CM | POA: Diagnosis not present

## 2022-01-17 DIAGNOSIS — T451X5A Adverse effect of antineoplastic and immunosuppressive drugs, initial encounter: Secondary | ICD-10-CM

## 2022-01-17 DIAGNOSIS — R590 Localized enlarged lymph nodes: Secondary | ICD-10-CM | POA: Insufficient documentation

## 2022-01-17 DIAGNOSIS — Z881 Allergy status to other antibiotic agents status: Secondary | ICD-10-CM | POA: Insufficient documentation

## 2022-01-17 DIAGNOSIS — R1313 Dysphagia, pharyngeal phase: Secondary | ICD-10-CM | POA: Diagnosis not present

## 2022-01-17 DIAGNOSIS — D61818 Other pancytopenia: Secondary | ICD-10-CM | POA: Diagnosis not present

## 2022-01-17 DIAGNOSIS — Z9221 Personal history of antineoplastic chemotherapy: Secondary | ICD-10-CM | POA: Insufficient documentation

## 2022-01-17 DIAGNOSIS — R5383 Other fatigue: Secondary | ICD-10-CM | POA: Insufficient documentation

## 2022-01-17 DIAGNOSIS — R091 Pleurisy: Secondary | ICD-10-CM | POA: Insufficient documentation

## 2022-01-17 DIAGNOSIS — D701 Agranulocytosis secondary to cancer chemotherapy: Secondary | ICD-10-CM | POA: Diagnosis not present

## 2022-01-17 DIAGNOSIS — C3411 Malignant neoplasm of upper lobe, right bronchus or lung: Secondary | ICD-10-CM

## 2022-01-17 DIAGNOSIS — Z923 Personal history of irradiation: Secondary | ICD-10-CM | POA: Insufficient documentation

## 2022-01-17 DIAGNOSIS — S2241XA Multiple fractures of ribs, right side, initial encounter for closed fracture: Secondary | ICD-10-CM | POA: Diagnosis not present

## 2022-01-17 DIAGNOSIS — R1314 Dysphagia, pharyngoesophageal phase: Secondary | ICD-10-CM | POA: Insufficient documentation

## 2022-01-17 DIAGNOSIS — G893 Neoplasm related pain (acute) (chronic): Secondary | ICD-10-CM

## 2022-01-17 DIAGNOSIS — R000 Tachycardia, unspecified: Secondary | ICD-10-CM | POA: Insufficient documentation

## 2022-01-17 DIAGNOSIS — Z7901 Long term (current) use of anticoagulants: Secondary | ICD-10-CM | POA: Insufficient documentation

## 2022-01-17 DIAGNOSIS — Z79899 Other long term (current) drug therapy: Secondary | ICD-10-CM | POA: Insufficient documentation

## 2022-01-17 DIAGNOSIS — Z5111 Encounter for antineoplastic chemotherapy: Secondary | ICD-10-CM

## 2022-01-17 DIAGNOSIS — J432 Centrilobular emphysema: Secondary | ICD-10-CM | POA: Insufficient documentation

## 2022-01-17 DIAGNOSIS — I700 Atherosclerosis of aorta: Secondary | ICD-10-CM | POA: Insufficient documentation

## 2022-01-17 DIAGNOSIS — Z9049 Acquired absence of other specified parts of digestive tract: Secondary | ICD-10-CM | POA: Insufficient documentation

## 2022-01-17 LAB — CMP (CANCER CENTER ONLY)
ALT: 9 U/L (ref 0–44)
AST: 11 U/L — ABNORMAL LOW (ref 15–41)
Albumin: 3.5 g/dL (ref 3.5–5.0)
Alkaline Phosphatase: 90 U/L (ref 38–126)
Anion gap: 7 (ref 5–15)
BUN: 7 mg/dL (ref 6–20)
CO2: 22 mmol/L (ref 22–32)
Calcium: 9.2 mg/dL (ref 8.9–10.3)
Chloride: 107 mmol/L (ref 98–111)
Creatinine: 1.01 mg/dL — ABNORMAL HIGH (ref 0.44–1.00)
GFR, Estimated: >60 mL/min (ref >60)
Glucose, Bld: 110 mg/dL — ABNORMAL HIGH (ref 70–99)
Potassium: 3.3 mmol/L — ABNORMAL LOW (ref 3.5–5.1)
Sodium: 136 mmol/L (ref 135–145)
Total Bilirubin: 0.5 mg/dL (ref 0.3–1.2)
Total Protein: 6.4 g/dL — ABNORMAL LOW (ref 6.5–8.1)

## 2022-01-17 LAB — CBC WITH DIFFERENTIAL (CANCER CENTER ONLY)
Abs Immature Granulocytes: 0.16 10*3/uL — ABNORMAL HIGH (ref 0.00–0.07)
Basophils Absolute: 0 10*3/uL (ref 0.0–0.1)
Basophils Relative: 0 %
Eosinophils Absolute: 0.1 10*3/uL (ref 0.0–0.5)
Eosinophils Relative: 1 %
HCT: 34 % — ABNORMAL LOW (ref 36.0–46.0)
Hemoglobin: 11.1 g/dL — ABNORMAL LOW (ref 12.0–15.0)
Immature Granulocytes: 2 %
Lymphocytes Relative: 21 %
Lymphs Abs: 1.5 10*3/uL (ref 0.7–4.0)
MCH: 32.2 pg (ref 26.0–34.0)
MCHC: 32.6 g/dL (ref 30.0–36.0)
MCV: 98.6 fL (ref 80.0–100.0)
Monocytes Absolute: 0.9 10*3/uL (ref 0.1–1.0)
Monocytes Relative: 13 %
Neutro Abs: 4.4 10*3/uL (ref 1.7–7.7)
Neutrophils Relative %: 63 %
Platelet Count: 299 10*3/uL (ref 150–400)
RBC: 3.45 MIL/uL — ABNORMAL LOW (ref 3.87–5.11)
RDW: 20.2 % — ABNORMAL HIGH (ref 11.5–15.5)
WBC Count: 7.1 10*3/uL (ref 4.0–10.5)
nRBC: 0 % (ref 0.0–0.2)

## 2022-01-17 LAB — SAMPLE TO BLOOD BANK

## 2022-01-17 LAB — MAGNESIUM: Magnesium: 1.3 mg/dL — ABNORMAL LOW (ref 1.7–2.4)

## 2022-01-17 MED ORDER — SODIUM CHLORIDE 0.9 % IV SOLN: INTRAVENOUS | Status: AC

## 2022-01-17 NOTE — Progress Notes (Signed)
[place] Telephone:(336) [phone]   Fax:([phone]) [phone]  OFFICE PROGRESS NOTE  [name], PA-C [place] [address]  DIAGNOSIS: Limited stage (T3, N2, M0) small cell lung cancer presented with right hilar/suprahilar mass with occlusion of the right upper lobe bronchus in addition to right paratracheal, right infra hilar and subcarinal lymphadenopathy as well as additional right upper lobe and right apical pulmonary nodules.  This was diagnosed in October 2022.  PRIOR THERAPY: Systemic chemotherapy with cisplatin 80 Mg/M2 on day 1 and etoposide 100 Mg/M2 on days 1, 2 and 3 every 3 weeks.  First dose October 02, 2021.  This will be concurrent with radiotherapy under the care of Dr. Sondra Come.  Status post 4 cycles.  Last cycle of chemotherapy was given on December 04, 2021 with almost complete response.  CURRENT THERAPY: None  INTERVAL HISTORY: [name] 51 y.o. female returns to the clinic today.  The patient is feeling fine today with no concerning complaints except for mild fatigue and pain on the right side of the chest.  She denied having any cough, shortness of breath or hemoptysis.  She denied having any fever or chills.  She has no nausea, vomiting, diarrhea or constipation.  She has no headache or visual changes.  She was admitted to the hospital earlier this month with aspiration pneumonia as well as dysphagia.  Had pancytopenia during that time.  She was admitted to the intensive care unit and initially was placed on BiPAP and then later room air after improvement of her condition.  She was treated with an aggressive course of antibiotics during her hospitalization.  She was also found to have right upper extremity DVT from the indwelling PICC line then this was removed.  She will be treated with Eliquis for 3 months.  She had several imaging studies including repeat CT scan of the chest few times recently that showed significant  improvement of her disease.  She is here today for evaluation and discussion of her treatment options.  MEDICAL HISTORY: Past Medical History:  Diagnosis Date   Ankle sprain    left   Anxiety    treated for panic attacks in the past.   Asthma    COPD (chronic obstructive pulmonary disease) ([place])    Depression    History of radiation therapy    Right lung- 09/26/21-11/13/21- Dr. Gery Pray   Irregular heart rate    Lung cancer [facility])    Renal disorder    kidney infections    ALLERGIES:  is allergic to naproxen and bactrim [sulfamethoxazole-trimethoprim].  MEDICATIONS:  Current Outpatient Medications  Medication Sig Dispense Refill   albuterol (PROVENTIL HFA;VENTOLIN HFA) 108 (90 Base) MCG/ACT inhaler Inhale 2 puffs into the lungs every 6 (six) hours as needed for wheezing or shortness of breath. 1 Inhaler 2   APIXABAN (ELIQUIS) VTE STARTER PACK (10MG  AND 5MG ) Take as directed on package: start with two-5mg  tablets twice daily for 7 days. On day 8, switch to one-5mg  tablet twice daily. 1 each 0   diphenhydramine-acetaminophen (TYLENOL PM) 25-500 MG TABS tablet Take 3 tablets by mouth at bedtime as needed (sleep).     HYDROmorphone (DILAUDID) 2 MG tablet Take 1 tablet (2 mg total) by mouth every 4 (four) hours as needed for severe pain. 30 tablet 0   nicotine (NICODERM CQ - DOSED IN MG/24 HOURS) 21 mg/24hr patch Place 1 patch (21 mg total) onto the skin  daily. (Patient not taking: Reported on 01/07/2022) 28 patch 0   ondansetron (ZOFRAN ODT) 8 MG disintegrating tablet Dissolve 1 tablet (8 mg total) by mouth every 8 (eight) hours as needed for nausea or vomiting. 30 tablet 2   oxyCODONE-acetaminophen (PERCOCET/ROXICET) 5-325 MG tablet Take 1 tablet by mouth every 6 hours as needed for severe pain. 15 tablet 0   pantoprazole (PROTONIX) 40 MG tablet Take 1 tablet (40 mg total) by mouth 2 (two) times daily. 60 tablet 0   sucralfate (CARAFATE) 1 GM/10ML suspension Take 10 mLs (1 g total) by  mouth 4 (four) times daily -  with meals and at bedtime. 420 mL 0   No current facility-administered medications for this visit.    SURGICAL HISTORY:  Past Surgical History:  Procedure Laterality Date   APPENDECTOMY     BRONCHIAL BIOPSY  09/21/2021   Procedure: BRONCHIAL BIOPSIES;  Surgeon: Collene Gobble, MD;  Location: [facility] ENDOSCOPY;  Service: Pulmonary;;   BRONCHIAL BRUSHINGS  09/21/2021   Procedure: BRONCHIAL BRUSHINGS;  Surgeon: Collene Gobble, MD;  Location: [facility] ENDOSCOPY;  Service: Pulmonary;;   BRONCHIAL NEEDLE ASPIRATION BIOPSY  09/21/2021   Procedure: BRONCHIAL NEEDLE ASPIRATION BIOPSIES;  Surgeon: Collene Gobble, MD;  Location: [facility] ENDOSCOPY;  Service: Pulmonary;;   ESOPHAGOGASTRODUODENOSCOPY N/A 12/26/2021   Procedure: ESOPHAGOGASTRODUODENOSCOPY (EGD);  Surgeon: Clarene Essex, MD;  Location: [facility] ENDOSCOPY;  Service: Endoscopy;  Laterality: N/A;   ESOPHAGOGASTRODUODENOSCOPY (EGD) WITH PROPOFOL N/A 01/07/2022   Procedure: ESOPHAGOGASTRODUODENOSCOPY (EGD) WITH PROPOFOL;  Surgeon: Otis Brace, MD;  Location: [facility] ENDOSCOPY;  Service: Gastroenterology;  Laterality: N/A;   FOREIGN BODY REMOVAL  12/26/2021   Procedure: FOREIGN BODY REMOVAL;  Surgeon: Clarene Essex, MD;  Location: [facility] ENDOSCOPY;  Service: Endoscopy;;   IR CHEST FLUORO  12/27/2021   LUMBAR LAMINECTOMY N/A 05/13/2018   Procedure: LEFT LUMBAR FOUR-FIVE MICRODISCECTOMY;  Surgeon: Marybelle Killings, MD;  Location: [place];  Service: Orthopedics;  Laterality: N/A;   TUBAL LIGATION     TUBAL LIGATION  1998   VIDEO BRONCHOSCOPY WITH ENDOBRONCHIAL ULTRASOUND N/A 09/21/2021   Procedure: VIDEO BRONCHOSCOPY WITH ENDOBRONCHIAL ULTRASOUND;  Surgeon: Collene Gobble, MD;  Location: [place] ENDOSCOPY;  Service: Pulmonary;  Laterality: N/A;    REVIEW OF SYSTEMS:  Constitutional: positive for fatigue Eyes: negative Ears, nose, mouth, throat, and face: negative Respiratory: positive for pleurisy/chest pain Cardiovascular: negative Gastrointestinal:  negative Genitourinary:negative Integument/breast: negative Hematologic/lymphatic: negative Musculoskeletal:negative Neurological: negative Behavioral/Psych: negative Endocrine: negative Allergic/Immunologic: negative   PHYSICAL EXAMINATION: General appearance: alert, cooperative, fatigued, and no distress Head: Normocephalic, without obvious abnormality, atraumatic Neck: no adenopathy, no JVD, supple, symmetrical, trachea midline, and thyroid not enlarged, symmetric, no tenderness/mass/nodules Lymph nodes: Cervical, supraclavicular, and axillary nodes normal. Resp: clear to auscultation bilaterally Back: symmetric, no curvature. ROM normal. No CVA tenderness. Cardio: regular rate and rhythm, S1, S2 normal, no murmur, click, rub or gallop GI: soft, non-tender; bowel sounds normal; no masses,  no organomegaly Extremities: extremities normal, atraumatic, no cyanosis or edema Neurologic: Alert and oriented X 3, normal strength and tone. Normal symmetric reflexes. Normal coordination and gait  ECOG PERFORMANCE STATUS: 1 - Symptomatic but completely ambulatory  Blood pressure 117/87, pulse (!) 137, temperature (!) 97 F (36.1 C), temperature source Tympanic, resp. rate 18, height 5\' 8"  (1.727 m), weight 145 lb (65.8 kg), last menstrual period 04/24/2018, SpO2 98 %.  LABORATORY DATA: Lab Results  Component Value Date   WBC 7.1 01/17/2022   HGB 11.1 (L) 01/17/2022   HCT 34.0 (L)  01/17/2022   MCV 98.6 01/17/2022   PLT 299 01/17/2022      Chemistry      Component Value Date/Time   NA 135 01/07/2022 0834   NA 139 02/18/2017 0932   K 3.1 (L) 01/07/2022 0834   CL 106 01/07/2022 0834   CO2 20 (L) 01/07/2022 0834   BUN 10 01/07/2022 0834   BUN 11 02/18/2017 0932   CREATININE 1.06 (H) 01/07/2022 0834   CREATININE 0.80 12/04/2021 0754      Component Value Date/Time   CALCIUM 9.6 01/07/2022 0834   ALKPHOS 60 01/01/2022 1157   AST 13 (L) 01/01/2022 1157   AST 9 (L) 12/04/2021 0754    ALT 15 01/01/2022 1157   ALT 7 12/04/2021 0754   BILITOT 0.4 01/01/2022 1157   BILITOT 0.7 12/04/2021 0754       RADIOGRAPHIC STUDIES: DG Chest 2 View  Result Date: 01/07/2022 CLINICAL DATA:  Chest pain.  History of lung cancer EXAM: CHEST - 2 VIEW COMPARISON:  01/01/2022 FINDINGS: The heart size and mediastinal contours are within normal limits. Mildly hyperinflated lungs. No focal airspace consolidation, pleural effusion, or pneumothorax. Callus formation related to healing right seventh rib fracture is noted. IMPRESSION: No active cardiopulmonary disease. Electronically Signed   By: Davina Poke D.O.   On: 01/07/2022 09:07   DG Chest 2 View  Result Date: 01/01/2022 CLINICAL DATA:  Dysphagia.  History of lung cancer EXAM: CHEST - 2 VIEW COMPARISON:  12/27/2021 FINDINGS: The heart size and mediastinal contours are stable. Mildly hyperinflated lungs. No new focal airspace consolidation. No pleural effusion or pneumothorax. The visualized skeletal structures are unremarkable. IMPRESSION: No active cardiopulmonary disease. Electronically Signed   By: Davina Poke D.O.   On: 01/01/2022 12:12   CT Angio Chest PE W and/or Wo Contrast  Result Date: 01/07/2022 CLINICAL DATA:  PE suspected, history of lung cancer EXAM: CT ANGIOGRAPHY CHEST WITH CONTRAST TECHNIQUE: Multidetector CT imaging of the chest was performed using the standard protocol during bolus administration of intravenous contrast. Multiplanar CT image reconstructions and MIPs were obtained to evaluate the vascular anatomy. RADIATION DOSE REDUCTION: This exam was performed according to the departmental dose-optimization program which includes automated exposure control, adjustment of the mA and/or kV according to patient size and/or use of iterative reconstruction technique. CONTRAST:  170mL OMNIPAQUE IOHEXOL 350 MG/ML SOLN COMPARISON:  01/01/2022 FINDINGS: Cardiovascular: Satisfactory opacification of the pulmonary arteries to the  segmental level. No evidence of pulmonary embolism. Normal heart size. No pericardial effusion. Aortic atherosclerosis. Mediastinum/Nodes: Unchanged post treatment appearance of soft tissue about the right hilum and pretracheal stations without residual discrete lymphadenopathy (series 5, image 96). Thyroid gland, trachea, and esophagus demonstrate no significant findings. Lungs/Pleura: Moderate centrilobular emphysema. Unchanged post treatment appearance of a spiculated nodule of the suprahilar right upper lobe measuring approximately 1.1 x 0.8 cm (series 6, image 45). No pleural effusion or pneumothorax. Upper Abdomen: No acute abnormality. Musculoskeletal: No chest wall abnormality. No acute osseous findings. Review of the MIP images confirms the above findings. IMPRESSION: 1. Negative examination for pulmonary embolism. 2. Unchanged post treatment appearance of a spiculated nodule of the suprahilar right upper lobe. 3. Emphysema. Aortic Atherosclerosis (ICD10-I70.0) and Emphysema (ICD10-J43.9). Electronically Signed   By: Delanna Ahmadi M.D.   On: 01/07/2022 11:41   CT Angio Chest PE W and/or Wo Contrast  Result Date: 01/01/2022 CLINICAL DATA:  Pulmonary embolus suspected with high probability. Right-sided chest pain and dysphagia. EXAM: CT ANGIOGRAPHY CHEST WITH CONTRAST TECHNIQUE:  Multidetector CT imaging of the chest was performed using the standard protocol during bolus administration of intravenous contrast. Multiplanar CT image reconstructions and MIPs were obtained to evaluate the vascular anatomy. RADIATION DOSE REDUCTION: This exam was performed according to the departmental dose-optimization program which includes automated exposure control, adjustment of the mA and/or kV according to patient size and/or use of iterative reconstruction technique. CONTRAST:  1mL OMNIPAQUE IOHEXOL 350 MG/ML SOLN COMPARISON:  12/20/2021 FINDINGS: Cardiovascular: There is moderately good opacification of the central and  segmental pulmonary arteries. No focal filling defects. No evidence of significant pulmonary embolus. The right upper lobe pulmonary artery is narrowed extrinsically due to the mass lesion. This appearance is unchanged since the prior study. Normal heart size. No pericardial effusions. Normal caliber thoracic aorta. No aortic dissection. Great vessel origins are patent. Mediastinum/Nodes: Thyroid gland is unremarkable. Thickening of the wall of the esophagus most prominent in the upper esophageal region. No significant dilatation. This could represent reflux disease or esophagitis. Radiation therapy could also have this appearance if within a port. Mildly enlarged right hilar lymph nodes at 1.3 cm diameter, unchanged. Lungs/Pleura: Severe diffuse emphysematous changes in the lungs. Dependent changes in the lung bases. Right suprahilar nodule measuring about 1.8 cm in diameter with associated scarring. This is consistent with known neoplasm and is unchanged. Focal area of vague airspace disease in the right upper lung posteriorly. This corresponds to an area of more dense consolidation on the previous study and is consistent with interval improvement. No pleural effusions. No pneumothorax. Upper Abdomen: No acute abnormalities demonstrated in the visualized upper abdomen. Musculoskeletal: No chest wall abnormality. No acute or significant osseous findings. Review of the MIP images confirms the above findings. IMPRESSION: 1. No evidence of significant pulmonary embolus. 2. Right suprahilar nodule and prominent right hilar lymph nodes consistent with known neoplasm. Scarring likely represents post treatment reactions. 3. Extensive emphysematous changes throughout the lungs. 4. Resolving focal area of consolidation in the right upper lung posteriorly. 5. Persistent esophageal wall thickening which could indicate reflux disease, esophagitis, or radiation change. Electronically Signed   By: Lucienne Capers M.D.   On:  01/01/2022 19:06   CT Angio Chest Pulmonary Embolism (PE) W or WO Contrast  Result Date: 12/20/2021 CLINICAL DATA:  Shortness of breath. Cough. Tachycardia. DVT. History of lung cancer. EXAM: CT ANGIOGRAPHY CHEST WITH CONTRAST TECHNIQUE: Multidetector CT imaging of the chest was performed using the standard protocol during bolus administration of intravenous contrast. Multiplanar CT image reconstructions and MIPs were obtained to evaluate the vascular anatomy. RADIATION DOSE REDUCTION: This exam was performed according to the departmental dose-optimization program which includes automated exposure control, adjustment of the mA and/or kV according to patient size and/or use of iterative reconstruction technique. CONTRAST:  61mL OMNIPAQUE IOHEXOL 350 MG/ML SOLN COMPARISON:  Radiograph yesterday. Chest CTA 6 days ago 12/24/2021, PET CT 10/12/2021 reviewed FINDINGS: Cardiovascular: Examination is diagnostic to the distal segmental level. No central pulmonary embolus. The subsegmental branches are not well assessed due to contrast bolus timing. The heart is normal in size. No pericardial effusion. Aortic atherosclerosis without dissection or acute aortic findings. Right upper extremity PICC tip in the lower SVC. There is contrast refluxing into the hepatic veins and IVC. Mediastinum/Nodes: Anterior paratracheal nodes measure up to 8 mm, not significantly changed from prior. There is a 10 mm right hilar lymph node. Circumferential wall thickening of the mid and upper esophagus which is mildly improved. The distal esophagus is patulous. Lungs/Pleura: Emphysema. Right suprahilar  nodule measures 18 mm, series 6, image 60. There is circumferential wall thickening of the right upper lobe bronchus with intraluminal debris and subsequent bronchial narrowing. Well demarcated subpleural opacity in the right upper lobe, series 6, image 30, 15 mm and not significantly changed from prior exam and prior PET. Left upper lobe 5 mm  nodule series 6, image 37, unchanged. There is debris within the right lower lobe bronchus with bronchial thickening and adjacent peribronchovascular opacities in the right lower lobe, for example series 6, image 82. Basilar assessment is limited by motion. No significant pleural effusion. Upper Abdomen: No acute findings. Musculoskeletal: Healing right anterior seventh and eighth rib fractures are unchanged. No new osseous findings. Review of the MIP images confirms the above findings. IMPRESSION: 1. No central pulmonary embolus. 2. There is contrast refluxing into the hepatic veins and IVC suggestive elevated right heart pressures. 3. Right suprahilar nodule measuring 18 mm, consistent with known malignancy. 4. There is circumferential wall thickening of the right upper lobe bronchus with intraluminal debris and subsequent bronchial narrowing. Right lower lobe bronchial thickening with intraluminal debris and adjacent peribronchovascular opacities in the right lower lobe, likely infectious or inflammatory. Given prominent esophageal thickening, the possibility of aspiration is raised. 5. Unchanged tiny left upper lobe nodule. Unchanged subpleural opacity at the right lung apex. 6. Emphysema. Aortic Atherosclerosis (ICD10-I70.0) and Emphysema (ICD10-J43.9). Electronically Signed   By: Keith Rake M.D.   On: 12/20/2021 17:54   IR Chest Fluoro  Result Date: 12/27/2021 INDICATION: Malfunctioning PICC line 51 year old female with history of indwelling right upper extremity PICC line with difficulty PICC line removal on the floor. IR requested to evaluate and removed under fluoroscopic guidance. EXAM: FLUOROSCOPIC GUIDED PICC LINE removal MEDICATIONS: None. CONTRAST:  None FLUOROSCOPY TIME:  0 minutes 18 seconds, 0 mGy COMPLICATIONS: None immediate. TECHNIQUE: The procedure, risks, benefits, and alternatives were explained to the patient and informed written consent was obtained. The right upper extremity and  external portion of the existing PICC line was prepped with chlorhexidine in a sterile fashion, and a sterile drape was applied covering the operative field. Maximum barrier sterile technique with sterile gowns and gloves were used for the procedure. A timeout was performed prior to the initiation of the procedure. The PICC line was attempted to be removed however there was significant resistance and stretching of the external portion of the catheter with associated right upper extremity pain reported by the patient. The central portion of the catheter was unchanged with the tip in the right innominate vein. A 0.018 inch Nitrex wire was inserted via the PICC line which then was attempted to be removed over the wire which was also unsuccessful with similar symptoms. Ultrasound evaluation demonstrated the PICC line coursing through a short segment of the biceps musculature. Therefore, subdermal Local anesthesia was provided near the PICC line skin entry site with 1% lidocaine. A skin nick was made from the established skin nick at the catheter entry site. Gentle blunt dissection was performed deep to the skin entry site. With more force than usually required, the PICC line was then retracted and removed intact. A sterile bandage was applied and hemostasis was achieved. The patient tolerated procedure well without immediate complication. The procedure was performed with assistance by Soyla Dryer, NP. FINDINGS: Difficult to remove PICC line, likely secondary to intramuscular course into the brachial vein. IMPRESSION: Successful fluoroscopic and ultrasound-guided removal of indwelling right upper extremity PICC line. Ruthann Cancer, MD [facility]iology  Electronically Signed   By: Ruthann Cancer M.D.   On: 12/27/2021 16:30   DG CHEST PORT 1 VIEW  Result Date: 12/27/2021 CLINICAL DATA:  PICC line placement EXAM: PORTABLE CHEST 1 VIEW COMPARISON:  12/18/2021  FINDINGS: Right-sided PICC line with the tip projecting over the SVC. No focal consolidation. No pleural effusion or pneumothorax. Heart and mediastinal contours are unremarkable. No acute osseous abnormality. IMPRESSION: 1. Right-sided PICC line with the tip projecting over the SVC. Electronically Signed   By: Kathreen Devoid M.D.   On: 12/27/2021 13:40   DG CHEST PORT 1 VIEW  Result Date: 12/19/2021 CLINICAL DATA:  Pneumonia EXAM: PORTABLE CHEST 1 VIEW COMPARISON:  Chest radiograph 12/19/2021 FINDINGS: There is a new right upper extremity PICC terminating at the cavoatrial junction. The cardiomediastinal silhouette is stable. There are patchy opacities in the medial right base, slightly increased from prior. There is no other focal airspace disease. There is no significant pleural effusion. There is no pneumothorax. There is no acute osseous abnormality. IMPRESSION: 1. New right upper extremity PICC with tip at the region of the cavoatrial junction. 2. Patchy opacities in the medial right base are slightly increased from prior and could reflect developing infection. Electronically Signed   By: Valetta Mole M.D.   On: 12/19/2021 08:04   DG Swallowing Func-Speech Pathology  Result Date: 12/24/2021 Table formatting from the original result was not included. Objective Swallowing Evaluation: Type of Study: MBS-Modified Barium Swallow Study  Patient Details Name: [name] MRN: [id number] Date of Birth: [birth year] Today's Date: 12/24/2021 Time: SLP Start Time (ACUTE ONLY): 1400 -SLP Stop Time (ACUTE ONLY): 1425 SLP Time Calculation (min) (ACUTE ONLY): 25 min Past Medical History: Past Medical History: Diagnosis Date  Ankle sprain   left  Anxiety   treated for panic attacks in the past.  Asthma   COPD (chronic obstructive pulmonary disease) ([facility])   Depression   History of radiation therapy   Right lung- 09/26/21-11/13/21- Dr. Gery Pray  Irregular heart rate   Lung cancer [facility])   Renal disorder   kidney  infections Past Surgical History: Past Surgical History: Procedure Laterality Date  APPENDECTOMY    BRONCHIAL BIOPSY  09/21/2021  Procedure: BRONCHIAL BIOPSIES;  Surgeon: Collene Gobble, MD;  Location: [place];  Service: Pulmonary;;  BRONCHIAL BRUSHINGS  09/21/2021  Procedure: BRONCHIAL BRUSHINGS;  Surgeon: Collene Gobble, MD;  Location: [place];  Service: Pulmonary;;  BRONCHIAL NEEDLE ASPIRATION BIOPSY  09/21/2021  Procedure: BRONCHIAL NEEDLE ASPIRATION BIOPSIES;  Surgeon: Collene Gobble, MD;  Location: [facility] ENDOSCOPY;  Service: Pulmonary;;  LUMBAR LAMINECTOMY N/A 05/13/2018  Procedure: LEFT LUMBAR FOUR-FIVE MICRODISCECTOMY;  Surgeon: Marybelle Killings, MD;  Location: [place];  Service: Orthopedics;  Laterality: N/A;  TUBAL LIGATION    TUBAL LIGATION  1998  VIDEO BRONCHOSCOPY WITH ENDOBRONCHIAL ULTRASOUND N/A 09/21/2021  Procedure: VIDEO BRONCHOSCOPY WITH ENDOBRONCHIAL ULTRASOUND;  Surgeon: Collene Gobble, MD;  Location: [place] ENDOSCOPY;  Service: Pulmonary;  Laterality: N/A; HPI: Pt is a 51 yo female adm [facility] on 1/21 with some increase in dyspnea.  Per medical notes pt has lung cancer undergoing chemoradiation. "She was noted to have vomited w/ carafate and lidocaine viscous; 1/20  w/ cc: shortness of breath and chest pain. Completed chemo about 8d prior w/ post-chemo course c/b nausea, vomiting and poor po intake.1/22 diet advanced to full liq. Got another unit PLTs. Had some hemoptysis w/ cough 1/23 new fever, rapid response called for increased WOB and tachycardia. Pt anxious.  PCXR w/ possible LL airspace disease. 1/24 remained tachycardic, still having CP and shoulder pain, still having nausea but no emesis. Developed progressive respiratory distress w/ loud audible expiratory wheezing. Pulse oximetry did not drop significantly however O2 was rapidly titrated up w/ little improvement in WOB. PCCM called emergently to bedside. Marked Upper airway expiratory wheezing noted w/ significant reduction during  phonation efforts and when reassured." Swallow eval ordered.  Pt admits to "choking" on liquids since her cancer diagnosis in October 2022,  Subjective: pleasant, but this morning started having increasing difficulty with swallowing and currently unable to even swallow water  Recommendations for follow up therapy are one component of a multi-disciplinary discharge planning process, led by the attending physician.  Recommendations may be updated based on patient status, additional functional criteria and insurance authorization. Assessment / Plan / Recommendation Clinical Impressions 12/24/2021 Clinical Impression Patient presents with a normal oral phase of swallow, mild pharyngeal phase dysphagia and a severe esophageal dysphagia. First bolus tested was small cup sip of thin liquid barium which resulted in mild swallow initiation delay to vallecular sinus but no penetration or aspiration. Second bolus tested was 1/2 spoonful of puree texture. During pharyngeal transit of puree, thin liquid barium could be seen refluxing up into pharynx. After initial swallow, puree and likely thin liquid barium mix almost immediately refluxed back up into pharynx. Patient was able regurgitate barium by 'hocking' it up after it had moved into vallecular sinus. Esophageal sweep revealed stasis of barium in esophagus just below UES. Barium continued to reflux from below UES and into pharynx. SLP concluded test at this time and patient was able to continue to regurgitate barium and expectorate into emesis bag. SLP informed MD of results. At this time, although patient did not exhibit any aspiration or penetration of thin liquids or puree solids, her esophageal phase of swallow is severely impaired. SLP recommending NPO except for ice chips, water or other liquid sips for pleasure. Patient is understanding and at this time, PO's are not giving her any pleasure. SLP to continue to follow patient with plan to consult more with MD regarding  POC moving foward. SLP Visit Diagnosis Dysphagia, pharyngoesophageal phase (R13.14) Attention and concentration deficit following -- Frontal lobe and executive function deficit following -- Impact on safety and function --   Treatment Recommendations 12/24/2021 Treatment Recommendations Therapy as outlined in treatment plan below   Prognosis 12/24/2021 Prognosis for Safe Diet Advancement Guarded Barriers to Reach Goals Severity of deficits Barriers/Prognosis Comment -- Diet Recommendations 12/24/2021 SLP Diet Recommendations NPO;Ice chips PRN after oral care;Other (Comment) Liquid Administration via Cup Medication Administration Via alternative means Compensations Small sips/bites Postural Changes --   Other Recommendations 12/24/2021 Recommended Consults -- Oral Care Recommendations Oral care BID;Patient independent with oral care Other Recommendations -- Follow Up Recommendations Other (comment) Assistance recommended at discharge None Functional Status Assessment Patient has had a recent decline in their functional status and demonstrates the ability to make significant improvements in function in a reasonable and predictable amount of time. Frequency and Duration  12/24/2021 Speech Therapy Frequency (ACUTE ONLY) min 2x/week Treatment Duration 1 week   Oral Phase 12/24/2021 Oral Phase WFL Oral - Pudding Teaspoon -- Oral - Pudding Cup -- Oral - Honey Teaspoon -- Oral - Honey Cup -- Oral - Nectar Teaspoon -- Oral - Nectar Cup -- Oral - Nectar Straw -- Oral - Thin Teaspoon -- Oral - Thin Cup -- Oral - Thin Straw -- Oral - Puree -- Oral - Mech  Soft -- Oral - Regular -- Oral - Multi-Consistency -- Oral - Pill -- Oral Phase - Comment --  Pharyngeal Phase 12/24/2021 Pharyngeal Phase Impaired Pharyngeal- Pudding Teaspoon -- Pharyngeal -- Pharyngeal- Pudding Cup -- Pharyngeal -- Pharyngeal- Honey Teaspoon -- Pharyngeal -- Pharyngeal- Honey Cup -- Pharyngeal -- Pharyngeal- Nectar Teaspoon -- Pharyngeal -- Pharyngeal- Nectar Cup  -- Pharyngeal -- Pharyngeal- Nectar Straw -- Pharyngeal -- Pharyngeal- Thin Teaspoon -- Pharyngeal -- Pharyngeal- Thin Cup Delayed swallow initiation-vallecula Pharyngeal -- Pharyngeal- Thin Straw -- Pharyngeal -- Pharyngeal- Puree WFL Pharyngeal -- Pharyngeal- Mechanical Soft -- Pharyngeal -- Pharyngeal- Regular -- Pharyngeal -- Pharyngeal- Multi-consistency -- Pharyngeal -- Pharyngeal- Pill -- Pharyngeal -- Pharyngeal Comment --  Cervical Esophageal Phase  12/24/2021 Cervical Esophageal Phase Impaired Pudding Teaspoon -- Pudding Cup -- Honey Teaspoon -- Honey Cup -- Nectar Teaspoon -- Nectar Cup -- Nectar Straw -- Thin Teaspoon -- Thin Cup Esophageal backflow into the pharynx Thin Straw -- Puree Esophageal backflow into the pharynx Mechanical Soft -- Regular -- Multi-consistency -- Pill -- Cervical Esophageal Comment -- Sonia Baller, MA, CCC-SLP Speech Therapy                     ECHOCARDIOGRAM COMPLETE  Result Date: 12/20/2021    ECHOCARDIOGRAM REPORT   Patient Name:   [name] Date of Exam: 12/20/2021 Medical Rec #:  [id number]      Height:       68.0 in Accession #:    [id number]     Weight:       151.7 lb Date of Birth:  February 25, 1971      BSA:          1.817 m Patient Age:    50 years       BP:           143/94 mmHg Patient Gender: F              HR:           117 bpm. Exam Location:  Inpatient Procedure: 2D Echo Indications:    Elevated brain natriuretic peptide level  History:        Patient has prior history of Echocardiogram examinations and                 Patient has no prior history of Echocardiogram examinations.                 COPD. Irregular heartrate.  Sonographer:    Arlyss Gandy Referring Phys: [place]  1. Left ventricular ejection fraction, by estimation, is 60 to 65%. The left ventricle has normal function. The left ventricle has no regional wall motion abnormalities. Left ventricular diastolic parameters were normal.  2. Right ventricular systolic  function is normal. The right ventricular size is normal. Tricuspid regurgitation signal is inadequate for assessing PA pressure.  3. The pericardial effusion is anterior to the right ventricle.  4. The mitral valve is normal in structure. Trivial mitral valve regurgitation. No evidence of mitral stenosis.  5. The aortic valve is normal in structure. Aortic valve regurgitation is not visualized. No aortic stenosis is present.  6. The inferior vena cava is normal in size with <50% respiratory variability, suggesting right atrial pressure of 8 mmHg. FINDINGS  Left Ventricle: Left ventricular ejection fraction, by estimation, is 60 to 65%. The left ventricle has normal function. The left ventricle has no regional wall motion abnormalities. The left ventricular internal cavity size  was normal in size. There is  no left ventricular hypertrophy. Left ventricular diastolic parameters were normal. Normal left ventricular filling pressure. Right Ventricle: The right ventricular size is normal. No increase in right ventricular wall thickness. Right ventricular systolic function is normal. Tricuspid regurgitation signal is inadequate for assessing PA pressure. Left Atrium: Left atrial size was normal in size. Right Atrium: Right atrial size was normal in size. Pericardium: Trivial pericardial effusion is present. The pericardial effusion is anterior to the right ventricle. Mitral Valve: The mitral valve is normal in structure. Trivial mitral valve regurgitation. No evidence of mitral valve stenosis. Tricuspid Valve: The tricuspid valve is normal in structure. Tricuspid valve regurgitation is not demonstrated. No evidence of tricuspid stenosis. Aortic Valve: The aortic valve is normal in structure. Aortic valve regurgitation is not visualized. No aortic stenosis is present. Aortic valve mean gradient measures 3.0 mmHg. Aortic valve peak gradient measures 6.0 mmHg. Aortic valve area, by VTI measures 2.03 cm. Pulmonic Valve: The  pulmonic valve was normal in structure. Pulmonic valve regurgitation is trivial. No evidence of pulmonic stenosis. Aorta: The aortic root is normal in size and structure. Venous: The inferior vena cava is normal in size with less than 50% respiratory variability, suggesting right atrial pressure of 8 mmHg. IAS/Shunts: No atrial level shunt detected by color flow Doppler.  LEFT VENTRICLE PLAX 2D LVIDd:         4.40 cm   Diastology LVIDs:         3.60 cm   LV e' medial:    14.10 cm/s LV PW:         0.90 cm   LV E/e' medial:  8.5 LV IVS:        0.90 cm   LV e' lateral:   17.70 cm/s LVOT diam:     2.00 cm   LV E/e' lateral: 6.8 LV SV:         41 LV SV Index:   22 LVOT Area:     3.14 cm  RIGHT VENTRICLE             IVC RV Basal diam:  3.00 cm     IVC diam: 2.20 cm RV Mid diam:    3.60 cm RV S prime:     17.30 cm/s TAPSE (M-mode): 1.6 cm LEFT ATRIUM             Index        RIGHT ATRIUM          Index LA diam:        3.20 cm 1.76 cm/m   RA Area:     9.01 cm LA Vol (A2C):   33.7 ml 18.55 ml/m  RA Volume:   16.80 ml 9.25 ml/m LA Vol (A4C):   37.0 ml 20.36 ml/m LA Biplane Vol: 35.3 ml 19.43 ml/m  AORTIC VALVE AV Area (Vmax):    1.89 cm AV Area (Vmean):   1.84 cm AV Area (VTI):     2.03 cm AV Vmax:           122.00 cm/s AV Vmean:          86.000 cm/s AV VTI:            0.201 m AV Peak Grad:      6.0 mmHg AV Mean Grad:      3.0 mmHg LVOT Vmax:         73.50 cm/s LVOT Vmean:        50.500 cm/s  LVOT VTI:          0.130 m LVOT/AV VTI ratio: 0.65  AORTA Ao Root diam: 3.20 cm Ao Asc diam:  3.20 cm MITRAL VALVE MV Area (PHT): 3.77 cm     SHUNTS MV Decel Time: 201 msec     Systemic VTI:  0.13 m MV E velocity: 120.00 cm/s  Systemic Diam: 2.00 cm Fransico Him MD Electronically signed by Fransico Him MD Signature Date/Time: 12/20/2021/2:34:21 PM    Final    VAS [place] UPPER EXTREMITY VENOUS DUPLEX  Result Date: 12/21/2021 UPPER VENOUS STUDY  Patient Name:  [name]  Date of Exam:   12/20/2021 Medical Rec #: [id number]        Accession #:    [id number] Date of Birth: 01-11-1971       Patient Gender: F Patient Age:   23 years Exam Location:  [facility] Procedure:      VAS [place] UPPER EXTREMITY VENOUS DUPLEX Referring Phys: A POWELL JR --------------------------------------------------------------------------------  Indications: Edema Other Indications: PICC line in RUE. Risk Factors: CA patient on chemotherapy. Comparison Study: No previous exams Performing Technologist: Jody Hill RVT, RDMS  Examination Guidelines: A complete evaluation includes B-mode imaging, spectral Doppler, color Doppler, and power Doppler as needed of all accessible portions of each vessel. Bilateral testing is considered an integral part of a complete examination. Limited examinations for reoccurring indications may be performed as noted.  Right Findings: +----------+------------+---------+-----------+----------+---------------------+  RIGHT      Compressible Phasicity Spontaneous Properties        Summary         +----------+------------+---------+-----------+----------+---------------------+  IJV            Full        Yes        Yes                                       +----------+------------+---------+-----------+----------+---------------------+  Subclavian     Full        Yes        Yes                                       +----------+------------+---------+-----------+----------+---------------------+  Axillary       Full        Yes        Yes                                       +----------+------------+---------+-----------+----------+---------------------+  Brachial       None        No         No                 Acute - one of paired                                                                  brachials        +----------+------------+---------+-----------+----------+---------------------+  Radial  Full                                                              +----------+------------+---------+-----------+----------+---------------------+  Ulnar          Full                                                             +----------+------------+---------+-----------+----------+---------------------+  Cephalic       Full                                                             +----------+------------+---------+-----------+----------+---------------------+  Basilic      Partial       Yes        Yes                        Acute          +----------+------------+---------+-----------+----------+---------------------+  Left Findings: +----------+------------+---------+-----------+----------+-------+  LEFT       Compressible Phasicity Spontaneous Properties Summary  +----------+------------+---------+-----------+----------+-------+  Subclavian     Full        Yes        Yes                         +----------+------------+---------+-----------+----------+-------+  Summary:  Right: Findings consistent with acute deep vein thrombosis involving the right brachial veins. Findings consistent with acute superficial vein thrombosis involving the right basilic vein.  Left: No evidence of thrombosis in the subclavian.  *See table(s) above for measurements and observations.  Diagnosing physician: Jamelle Haring Electronically signed by Jamelle Haring on 12/21/2021 at 5:12:32 AM.    Final    [place] EKG SITE RITE  Result Date: 12/18/2021 If Site Rite image not attached, placement could not be confirmed due to current cardiac rhythm.  DG ESOPHAGUS W SINGLE CM (SOL OR THIN BA)  Result Date: 12/24/2021 CLINICAL DATA:  Abnormal esophagus on swallowing evaluation. EXAM: ESOPHOGRAM/BARIUM SWALLOW TECHNIQUE: Single contrast examination was performed using  thin barium. FLUOROSCOPY TIME:  Fluoroscopy Time:  24 seconds Radiation Exposure Index (if provided by the fluoroscopic device): 2.4 mGy Number of Acquired Spot Images: 0 COMPARISON:  CT of the chest of December 20, 2021. FINDINGS: Only a  very limited esophagram could be performed. A small amount of barium contrast was swallowed which outlined debris within the esophagus proximal to an area of narrowing. Abrupt narrowing of the esophagus in the proximal to mid esophageal level corresponding to thickening seen on prior CT imaging. The patient was unable to continue for a complete esophageal evaluation due to this finding and vomited after swallowing only a small amount of contrast. 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 Endoscopic assessment is suggested for further evaluation/potential treatment and to exclude the less likely possibility of concomitant neoplasm. This exam was performed by Alfredia Ferguson, PA, and was supervised and interpreted by Zetta Bills Electronically Signed   By: Zetta Bills M.D.   On: 12/24/2021 16:02    ASSESSMENT AND PLAN: This is a very pleasant 51 years old white female recently diagnosed with limited stage (T3, N2, MX) small cell lung cancer presented with right hilar/suprahilar mass with occlusion of the right upper lobe bronchus in addition to right paratracheal, right infrahilar and subcarinal lymphadenopathy as well as right upper lobe and right apical pulmonary nodules diagnosed in October 2022. The patient had MRI of the brain performed recently that showed no evidence of metastatic disease to the brain.   The patient systemic chemotherapy with cisplatin 80 Mg/M2 on day 1 and 2 etoposide 100 Mg/M2 on days 1, 2 and 3 status post 4 cycles.  This is concurrent with radiotherapy.   She tolerated her treatment well except for the fatigue and pancytopenia as well as dysphagia from the radiotherapy. Repeat imaging studies with CT scan of the chest done few times recently showed significant improvement in  her disease. I recommended for the patient to continue on observation with repeat CT scan of the chest in 3 months. I recommended for the patient to see Dr. Sondra Come for evaluation and discussion of prophylactic cranial irradiation. For pain management I will refer the patient to the pain clinic for further evaluation and management of her pain issues.  She will continue with her current pain medication for now. For the tachycardia, she had repeat EKG today that showed sinus tachycardia but we will refer her to cardiology for evaluation and to rule out any other underlying etiology of her tachycardia.  Her mother has a history of SVT. The patient was advised to call immediately if she has any other concerning symptoms in the interval. The patient voices understanding of current disease status and treatment options and is in agreement with the current care plan.  All questions were answered. The patient knows to call the clinic with any problems, questions or concerns. We can certainly see the patient much sooner if necessary.  Disclaimer: This note was dictated with voice recognition software. Similar sounding words can inadvertently be transcribed and may not be corrected upon review.

## 2022-01-17 NOTE — Patient Instructions (Signed)
Dehydration, Adult Dehydration is condition in which there is not enough water or other fluids in the body. This happens when a person loses more fluids than he or she takes in. Important body parts cannot work right without the right amount of fluids. Any loss of fluids from the body can cause dehydration. Dehydration can be mild, worse, or very bad. It should be treated right away to keep it from getting very bad. What are the causes? This condition may be caused by: Conditions that cause loss of water or other fluids, such as: Watery poop (diarrhea). Vomiting. Sweating a lot. Peeing (urinating) a lot. Not drinking enough fluids, especially when you: Are ill. Are doing things that take a lot of energy to do. Other illnesses and conditions, such as fever or infection. Certain medicines, such as medicines that take extra fluid out of the body (diuretics). Lack of safe drinking water. Not being able to get enough water and food. What increases the risk? The following factors may make you more likely to develop this condition: Having a long-term (chronic) illness that has not been treated the right way, such as: Diabetes. Heart disease. Kidney disease. Being 51 years of age or older. Having a disability. Living in a place that is high above the ground or sea (high in altitude). The thinner, dried air causes more fluid loss. Doing exercises that put stress on your body for a long time. What are the signs or symptoms? Symptoms of dehydration depend on how bad it is. Mild or worse dehydration Thirst. Dry lips or dry mouth. Feeling dizzy or light-headed, especially when you stand up from sitting. Muscle cramps. Your body making: Dark pee (urine). Pee may be the color of tea. Less pee than normal. Less tears than normal. Headache. Very bad dehydration Changes in skin. Skin may: Be cold to the touch (clammy). Be blotchy or pale. Not go back to normal right after you lightly pinch  it and let it go. Little or no tears, pee, or sweat. Changes in vital signs, such as: Fast breathing. Low blood pressure. Weak pulse. Pulse that is more than 100 beats a minute when you are sitting still. Other changes, such as: Feeling very thirsty. Eyes that look hollow (sunken). Cold hands and feet. Being mixed up (confused). Being very tired (lethargic) or having trouble waking from sleep. Short-term weight loss. Loss of consciousness. How is this treated? Treatment for this condition depends on how bad it is. Treatment should start right away. Do not wait until your condition gets very bad. Very bad dehydration is an emergency. You will need to go to a hospital. Mild or worse dehydration can be treated at home. You may be asked to: Drink more fluids. Drink an oral rehydration solution (ORS). This drink helps get the right amounts of fluids and salts and minerals in the blood (electrolytes). Very bad dehydration can be treated: With fluids through an IV tube. By getting normal levels of salts and minerals in your blood. This is often done by giving salts and minerals through a tube. The tube is passed through your nose and into your stomach. By treating the root cause. Follow these instructions at home: Oral rehydration solution If told by your doctor, drink an ORS: Make an ORS. Use instructions on the package. Start by drinking small amounts, about  cup (120 mL) every 5-10 minutes. Slowly drink more until you have had the amount that your doctor said to have. Eating and drinking  Drink enough clear fluid to keep your pee pale yellow. If you were told to drink an ORS, finish the ORS first. Then, start slowly drinking other clear fluids. Drink fluids such as: Water. Do not drink only water. Doing that can make the salt (sodium) level in your body get too low. Water from ice chips you suck on. Fruit juice that you have added water to (diluted). Low-calorie sports  drinks. Eat foods that have the right amounts of salts and minerals, such as: Bananas. Oranges. Potatoes. Tomatoes. Spinach. Do not drink alcohol. Avoid: Drinks that have a lot of sugar. These include: High-calorie sports drinks. Fruit juice that you did not add water to. Soda. Caffeine. Foods that are greasy or have a lot of fat or sugar. General instructions Take over-the-counter and prescription medicines only as told by your doctor. Do not take salt tablets. Doing that can make the salt level in your body get too high. Return to your normal activities as told by your doctor. Ask your doctor what activities are safe for you. Keep all follow-up visits as told by your doctor. This is important. Contact a doctor if: You have pain in your belly (abdomen) and the pain: Gets worse. Stays in one place. You have a rash. You have a stiff neck. You get angry or annoyed (irritable) more easily than normal. You are more tired or have a harder time waking than normal. You feel: Weak or dizzy. Very thirsty. Get help right away if you have: Any symptoms of very bad dehydration. Symptoms of vomiting, such as: You cannot eat or drink without vomiting. Your vomiting gets worse or does not go away. Your vomit has blood or green stuff in it. Symptoms that get worse with treatment. A fever. A very bad headache. Problems with peeing or pooping (having a bowel movement), such as: Watery poop that gets worse or does not go away. Blood in your poop (stool). This may cause poop to look black and tarry. Not peeing in 6-8 hours. Peeing only a small amount of very dark pee in 6-8 hours. Trouble breathing. These symptoms may be an emergency. Do not wait to see if the symptoms will go away. Get medical help right away. Call your local emergency services (911 in the [place] Do not drive yourself to the hospital. Summary Dehydration is a condition in which there is not enough water or other fluids  in the body. This happens when a person loses more fluids than he or she takes in. Treatment for this condition depends on how bad it is. Treatment should be started right away. Do not wait until your condition gets very bad. Drink enough clear fluid to keep your pee pale yellow. If you were told to drink an oral rehydration solution (ORS), finish the ORS first. Then, start slowly drinking other clear fluids. Take over-the-counter and prescription medicines only as told by your doctor. Get help right away if you have any symptoms of very bad dehydration. This information is not intended to replace advice given to you by your health care provider. Make sure you discuss any questions you have with your health care provider. Document Revised: 06/24/2019 Document Reviewed: 06/24/2019 Elsevier Patient Education  [place].

## 2022-01-18 ENCOUNTER — Encounter: Payer: Self-pay | Admitting: Internal Medicine

## 2022-01-18 DIAGNOSIS — Z5111 Encounter for antineoplastic chemotherapy: Secondary | ICD-10-CM | POA: Insufficient documentation

## 2022-01-23 NOTE — Progress Notes (Signed)
Radiation Oncology         [phone]) [phone] ________________________________  Name: [name] MRN: [id number]  Date: 01/24/2022  DOB: 01-17-1971  Follow-Up Visit Note  CC: [name], PA-C  Livengood, Jessica J, [place]*    ICD-10-CM   1. 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 Since her last visit, the patient presented to the ED on 01/07/22 with right-sided chest pain, shortness of breath, uncontrolled nausea and vomiting. She was also found to be tachycardic on arrival. The patient reported that she was doing well up until that prior Friday when she started feeling as if food was stuck and she had difficulty swallowing and keeping liquids down.  She since developed right-sided dull chest pain, associated with shortness of breath, with some pleuritic component to it.  She also endorsed chills but was noted as afebrile.  Concern for repeat food impaction resulting in inability to tolerate oral intake and subsequent dehydration was noted upon ED evaluation, and the patient was administered an IV fluid bolus with subsequent improvement in her tachycardia.  Given the patient's sensations of food impaction, nausea, vomiting, and inability tolerate oral intake, GI was consulted who recommended EGD which revealed food impaction in the middle third of the esophagus which was removed.  GI recommendations included twice daily PPI, and Carafate 4 times a day. Labs also showed the patient to be hypomagnesemic to  1.4 and hypokalemic to 3.1. She received IV electrolyte replenishment in the ED with 2 g of IV magnesium and runs of potassium IV.  She was administered Klor-Con packet for potassium replenishment.   Imaging performed during ED course on 01/07/22 is as follows:  --  CTA of the chest on 01/07/22 revealed unchanged post treatment appearance of a spiculated nodule of the suprahilar right upper lobe. No evidence of PE appreciated.                    The patient also recently followed up with Dr. Julien Nordmann on 01/17/22. During which time, the patient was noted to be doing generally well other than ongoing mild fatigue and pain of the right side of her chest. She denied having any cough, shortness of breath or hemoptysis. For pain management, Dr. Julien Nordmann also referred the patient to the pain clinic for further evaluation and management. For her tachycardia, she had a repeat EKG performed during this visit which showed sinus tachycardia, for which Dr. Julien Nordmann placed a referral to cardiology for further evaluation. Otherwise, the patient was advised to continue on observation with repeat imaging in 3 months.       She reports her swallowing is getting better.  She is still on soft liquid diet at this time.  Allergies:  is allergic to naproxen and bactrim [sulfamethoxazole-trimethoprim].  Meds: Current Outpatient Medications  Medication Sig Dispense Refill   albuterol (PROVENTIL HFA;VENTOLIN HFA) 108 (90 Base) MCG/ACT inhaler Inhale 2 puffs into the lungs every 6 (six) hours as  needed for wheezing or shortness of breath. 1 Inhaler 2   APIXABAN (ELIQUIS) VTE STARTER PACK (10MG  AND 5MG ) Take as directed on package: start with two-5mg  tablets twice daily for 7 days. On day 8, switch to one-5mg  tablet twice daily. 1 each 0   diphenhydramine-acetaminophen (TYLENOL PM) 25-500 MG TABS tablet Take 3 tablets by mouth at bedtime as needed (sleep).     HYDROmorphone (DILAUDID) 2 MG tablet Take 1 tablet (2 mg total) by  mouth every 4 (four) hours as needed for severe pain. (Patient not taking: Reported on 01/24/2022) 30 tablet 0   nicotine (NICODERM CQ - DOSED IN MG/24 HOURS) 21 mg/24hr patch Place 1 patch (21 mg total) onto the skin daily. (Patient not taking: Reported on 01/07/2022) 28 patch 0   ondansetron (ZOFRAN ODT) 8 MG disintegrating tablet Dissolve 1 tablet (8 mg total) by mouth every 8 (eight) hours as needed for nausea or vomiting. (Patient not taking: Reported on 01/24/2022) 30 tablet 2   oxyCODONE-acetaminophen (PERCOCET/ROXICET) 5-325 MG tablet Take 1 tablet by mouth every 6 hours as needed for severe pain. 30 tablet 0   pantoprazole (PROTONIX) 40 MG tablet Take 1 tablet (40 mg total) by mouth 2 (two) times daily. (Patient not taking: Reported on 01/24/2022) 60 tablet 0   sucralfate (CARAFATE) 1 GM/10ML suspension Take 10 mLs (1 g total) by mouth 4 (four) times daily -  with meals and at bedtime. 420 mL 0   No current facility-administered medications for this encounter.    Physical Findings: The patient is in no acute distress. Patient is alert and oriented.  height is 5\' 8"  (1.727 m) and weight is 140 lb 6.4 oz (63.7 kg). Her temperature is 98.4 F (36.9 C). Her blood pressure is 112/99 (abnormal) and her pulse is 124 (abnormal). Her respiration is 20 and oxygen saturation is 100%. .   Lungs are clear to auscultation bilaterally. Heart has regular rate and rhythm. No palpable cervical, supraclavicular, or axillary adenopathy. Abdomen soft, non-tender, normal bowel sounds.    Lab Findings: Lab Results  Component Value Date   WBC 7.1 01/17/2022   HGB 11.1 (L) 01/17/2022   HCT 34.0 (L) 01/17/2022   MCV 98.6 01/17/2022   PLT 299 01/17/2022    Radiographic Findings: DG Chest 2 View  Result Date: 01/07/2022 CLINICAL DATA:  Chest pain.  History of lung cancer EXAM: CHEST - 2 VIEW COMPARISON:  01/01/2022 FINDINGS: The heart size and mediastinal contours are within normal limits. Mildly hyperinflated  lungs. No focal airspace consolidation, pleural effusion, or pneumothorax. Callus formation related to healing right seventh rib fracture is noted. IMPRESSION: No active cardiopulmonary disease. Electronically Signed   By: Davina Poke D.O.   On: 01/07/2022 09:07   DG Chest 2 View  Result Date: 01/01/2022 CLINICAL DATA:  Dysphagia.  History of lung cancer EXAM: CHEST - 2 VIEW COMPARISON:  12/27/2021 FINDINGS: The heart size and mediastinal contours are stable. Mildly hyperinflated lungs. No new focal airspace consolidation. No pleural effusion or pneumothorax. The visualized skeletal structures are unremarkable. IMPRESSION: No active cardiopulmonary disease. Electronically Signed   By: Davina Poke D.O.   On: 01/01/2022 12:12   CT Angio Chest PE W and/or Wo Contrast  Result Date: 01/07/2022 CLINICAL DATA:  PE suspected, history of lung cancer EXAM: CT ANGIOGRAPHY CHEST WITH CONTRAST TECHNIQUE: Multidetector CT imaging of the chest was performed using the standard protocol during bolus administration of intravenous contrast. Multiplanar CT image reconstructions and MIPs were obtained to  evaluate the vascular anatomy. RADIATION DOSE REDUCTION: This exam was performed according to the departmental dose-optimization program which includes automated exposure control, adjustment of the mA and/or kV according to patient size and/or use of iterative reconstruction technique. CONTRAST:  164mL OMNIPAQUE IOHEXOL 350 MG/ML SOLN COMPARISON:  01/01/2022 FINDINGS: Cardiovascular: Satisfactory opacification of the pulmonary arteries to the segmental level. No evidence of pulmonary embolism. Normal heart size. No pericardial effusion. Aortic atherosclerosis. Mediastinum/Nodes: Unchanged post treatment appearance of soft tissue about the right hilum and pretracheal stations without residual discrete lymphadenopathy (series 5, image 96). Thyroid gland, trachea, and esophagus demonstrate no significant findings.  Lungs/Pleura: Moderate centrilobular emphysema. Unchanged post treatment appearance of a spiculated nodule of the suprahilar right upper lobe measuring approximately 1.1 x 0.8 cm (series 6, image 45). No pleural effusion or pneumothorax. Upper Abdomen: No acute abnormality. Musculoskeletal: No chest wall abnormality. No acute osseous findings. Review of the MIP images confirms the above findings. IMPRESSION: 1. Negative examination for pulmonary embolism. 2. Unchanged post treatment appearance of a spiculated nodule of the suprahilar right upper lobe. 3. Emphysema. Aortic Atherosclerosis (ICD10-I70.0) and Emphysema (ICD10-J43.9). Electronically Signed   By: Delanna Ahmadi M.D.   On: 01/07/2022 11:41   CT Angio Chest PE W and/or Wo Contrast  Result Date: 01/01/2022 CLINICAL DATA:  Pulmonary embolus suspected with high probability. Right-sided chest pain and dysphagia. EXAM: CT ANGIOGRAPHY CHEST WITH CONTRAST TECHNIQUE: Multidetector CT imaging of the chest was performed using the standard protocol during bolus administration of intravenous contrast. Multiplanar CT image reconstructions and MIPs were obtained to evaluate the vascular anatomy. RADIATION DOSE REDUCTION: This exam was performed according to the departmental dose-optimization program which includes automated exposure control, adjustment of the mA and/or kV according to patient size and/or use of iterative reconstruction technique. CONTRAST:  79mL OMNIPAQUE IOHEXOL 350 MG/ML SOLN COMPARISON:  12/20/2021 FINDINGS: Cardiovascular: There is moderately good opacification of the central and segmental pulmonary arteries. No focal filling defects. No evidence of significant pulmonary embolus. The right upper lobe pulmonary artery is narrowed extrinsically due to the mass lesion. This appearance is unchanged since the prior study. Normal heart size. No pericardial effusions. Normal caliber thoracic aorta. No aortic dissection. Great vessel origins are patent.  Mediastinum/Nodes: Thyroid gland is unremarkable. Thickening of the wall of the esophagus most prominent in the upper esophageal region. No significant dilatation. This could represent reflux disease or esophagitis. Radiation therapy could also have this appearance if within a port. Mildly enlarged right hilar lymph nodes at 1.3 cm diameter, unchanged. Lungs/Pleura: Severe diffuse emphysematous changes in the lungs. Dependent changes in the lung bases. Right suprahilar nodule measuring about 1.8 cm in diameter with associated scarring. This is consistent with known neoplasm and is unchanged. Focal area of vague airspace disease in the right upper lung posteriorly. This corresponds to an area of more dense consolidation on the previous study and is consistent with interval improvement. No pleural effusions. No pneumothorax. Upper Abdomen: No acute abnormalities demonstrated in the visualized upper abdomen. Musculoskeletal: No chest wall abnormality. No acute or significant osseous findings. Review of the MIP images confirms the above findings. IMPRESSION: 1. No evidence of significant pulmonary embolus. 2. Right suprahilar nodule and prominent right hilar lymph nodes consistent with known neoplasm. Scarring likely represents post treatment reactions. 3. Extensive emphysematous changes throughout the lungs. 4. Resolving focal area of consolidation in the right upper lung posteriorly. 5. Persistent esophageal wall thickening which could indicate reflux disease, esophagitis, or radiation change. Electronically Signed  By: Lucienne Capers M.D.   On: 01/01/2022 19:06   IR Chest Fluoro  Result Date: 12/27/2021 INDICATION: Malfunctioning PICC line 51 year old female with history of indwelling right upper extremity PICC line with difficulty PICC line removal on the floor. IR requested to evaluate and removed under fluoroscopic guidance. EXAM: FLUOROSCOPIC GUIDED PICC LINE removal MEDICATIONS: None. CONTRAST:  None  FLUOROSCOPY TIME:  0 minutes 18 seconds, 0 mGy COMPLICATIONS: None immediate. TECHNIQUE: The procedure, risks, benefits, and alternatives were explained to the patient and informed written consent was obtained. The right upper extremity and external portion of the existing PICC line was prepped with chlorhexidine in a sterile fashion, and a sterile drape was applied covering the operative field. Maximum barrier sterile technique with sterile gowns and gloves were used for the procedure. A timeout was performed prior to the initiation of the procedure. The PICC line was attempted to be removed however there was significant resistance and stretching of the external portion of the catheter with associated right upper extremity pain reported by the patient. The central portion of the catheter was unchanged with the tip in the right innominate vein. A 0.018 inch Nitrex wire was inserted via the PICC line which then was attempted to be removed over the wire which was also unsuccessful with similar symptoms. Ultrasound evaluation demonstrated the PICC line coursing through a short segment of the biceps musculature. Therefore, subdermal Local anesthesia was provided near the PICC line skin entry site with 1% lidocaine. A skin nick was made from the established skin nick at the catheter entry site. Gentle blunt dissection was performed deep to the skin entry site. With more force than usually required, the PICC line was then retracted and removed intact. A sterile bandage was applied and hemostasis was achieved. The patient tolerated procedure well without immediate complication. The procedure was performed with assistance by Soyla Dryer, NP. FINDINGS: Difficult to remove PICC line, likely secondary to intramuscular course into the brachial vein. IMPRESSION: Successful fluoroscopic and ultrasound-guided removal of indwelling right upper extremity PICC line. Ruthann Cancer, MD Vascular and Interventional Radiology  Special[facility] Radiology Electronically Signed   By: Ruthann Cancer M.D.   On: 12/27/2021 16:30   DG CHEST PORT 1 VIEW  Result Date: 12/27/2021 CLINICAL DATA:  PICC line placement EXAM: PORTABLE CHEST 1 VIEW COMPARISON:  12/18/2021 FINDINGS: Right-sided PICC line with the tip projecting over the SVC. No focal consolidation. No pleural effusion or pneumothorax. Heart and mediastinal contours are unremarkable. No acute osseous abnormality. IMPRESSION: 1. Right-sided PICC line with the tip projecting over the SVC. Electronically Signed   By: Kathreen Devoid M.D.   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  She continues to have esophageal issues but this seems to be improving.  She was given some additional nutritional supplements to try encouraged her to consider Carnation instant breakfast for increased nutrients and protein.  She is about to run out of her Carafate and I have refilled this today.  She is out of her pain medication and has not been set up with the pain clinic yet.  I have given her a limited supply of her pain medication until she is seen by the pain clinic.  We discussed consideration for prophylactic cranial radiation since she appears to be in remission.  We discussed the potential long-term effects of this treatment.  She after  consideration does wish to proceed with this treatment.  Since it has been 4 months since her previous MRI I will schedule a MRI prior to initiation of this treatment.  Plan: She will proceed with an MRI and then set up for prophylactic cranial radiation.    ____________________________________  Blair Promise, PhD, MD   This document serves as a record of services personally performed by Gery Pray, MD. It was created on his behalf by Roney Mans, a trained medical scribe. The creation of this record is based on the  scribe's personal observations and the provider's statements to them. This document has been checked and approved by the attending provider.

## 2022-01-24 ENCOUNTER — Other Ambulatory Visit (HOSPITAL_COMMUNITY): Payer: Self-pay

## 2022-01-24 ENCOUNTER — Encounter: Payer: Self-pay | Admitting: Radiation Oncology

## 2022-01-24 ENCOUNTER — Other Ambulatory Visit: Payer: Self-pay

## 2022-01-24 ENCOUNTER — Ambulatory Visit
Admission: RE | Admit: 2022-01-24 | Discharge: 2022-01-24 | Disposition: A | Discharge status: Home / Self Care | Payer: Medicaid Other | Source: Ambulatory Visit | Attending: Radiation Oncology | Admitting: Radiation Oncology

## 2022-01-24 DIAGNOSIS — R079 Chest pain, unspecified: Secondary | ICD-10-CM | POA: Diagnosis not present

## 2022-01-24 DIAGNOSIS — R1310 Dysphagia, unspecified: Secondary | ICD-10-CM | POA: Insufficient documentation

## 2022-01-24 DIAGNOSIS — Z7901 Long term (current) use of anticoagulants: Secondary | ICD-10-CM | POA: Diagnosis not present

## 2022-01-24 DIAGNOSIS — Z79899 Other long term (current) drug therapy: Secondary | ICD-10-CM | POA: Diagnosis not present

## 2022-01-24 DIAGNOSIS — Z298 Encounter for other specified prophylactic measures: Secondary | ICD-10-CM | POA: Insufficient documentation

## 2022-01-24 DIAGNOSIS — C3411 Malignant neoplasm of upper lobe, right bronchus or lung: Secondary | ICD-10-CM | POA: Diagnosis present

## 2022-01-24 DIAGNOSIS — J432 Centrilobular emphysema: Secondary | ICD-10-CM | POA: Insufficient documentation

## 2022-01-24 DIAGNOSIS — R000 Tachycardia, unspecified: Secondary | ICD-10-CM | POA: Insufficient documentation

## 2022-01-24 DIAGNOSIS — I700 Atherosclerosis of aorta: Secondary | ICD-10-CM | POA: Insufficient documentation

## 2022-01-24 DIAGNOSIS — Z923 Personal history of irradiation: Secondary | ICD-10-CM | POA: Insufficient documentation

## 2022-01-24 MED ORDER — OXYCODONE-ACETAMINOPHEN 5-325 MG PO TABS
1.0000 | ORAL_TABLET | Freq: Four times a day (QID) | ORAL | 0 refills | Status: DC | PRN
  Filled 2022-01-24: qty 30, 5d supply, fill #0

## 2022-01-24 MED ORDER — SUCRALFATE 1 GM/10ML PO SUSP
1.0000 g | Freq: Three times a day (TID) | ORAL | 0 refills | Status: DC
  Filled 2022-01-24: qty 420, 10d supply, fill #0

## 2022-01-24 NOTE — Progress Notes (Signed)
[name] is here today for follow up post radiation to the lung. ? ?Lung Side: right, patient completed treatment on 11/13/21 ? ?Does the patient complain of any of the following: ?Pain:Patient reports having pain to right chest, rating 5/10. Patient taking Tylenol PM, which is not effective. ?Shortness of breath w/wo exertion: yes ?Cough: yes, patient unable to cough up mucus. ?Hemoptysis: no ?Pain with swallowing: yes, patient reports improvement with swallowing. Reports more pain after eating. ?Swallowing/choking concerns: no ?Appetite: Fair, patient only taking in soft foods. ?Weight:  ?Wt Readings from Last 3 Encounters:  ?01/24/22 140 lb 6.4 oz (63.7 kg)  ?01/17/22 145 lb (65.8 kg)  ?01/07/22 153 lb 9.6 oz (69.7 kg)  ?  ?Energy Level: low ?Post radiation skin Changes: no ? ? ? ?Additional comments if applicable:  ?Vitals:  ? 01/24/22 0903  ?BP: (!) 112/99  ?Pulse: (!) 124  ?Resp: 20  ?Temp: 98.4 ?F (36.9 ?C)  ?SpO2: 100%  ?Weight: 140 lb 6.4 oz (63.7 kg)  ?Height: 5\' 8"  (1.727 m)  ?  ?

## 2022-01-25 ENCOUNTER — Other Ambulatory Visit (HOSPITAL_COMMUNITY): Payer: Self-pay

## 2022-01-25 ENCOUNTER — Encounter: Payer: Self-pay | Admitting: Internal Medicine

## 2022-01-30 ENCOUNTER — Other Ambulatory Visit (HOSPITAL_COMMUNITY): Payer: Self-pay

## 2022-01-31 ENCOUNTER — Other Ambulatory Visit: Payer: Self-pay

## 2022-01-31 ENCOUNTER — Ambulatory Visit (HOSPITAL_COMMUNITY)
Admission: RE | Admit: 2022-01-31 | Discharge: 2022-01-31 | Disposition: A | Discharge status: Home / Self Care | Payer: Medicaid Other | Source: Ambulatory Visit | Attending: Radiation Oncology | Admitting: Radiation Oncology

## 2022-01-31 DIAGNOSIS — C3411 Malignant neoplasm of upper lobe, right bronchus or lung: Secondary | ICD-10-CM | POA: Diagnosis not present

## 2022-01-31 MED ORDER — GADOBUTROL 1 MMOL/ML IV SOLN
6.0000 mL | Freq: Once | INTRAVENOUS | Status: AC | PRN
  Administered 2022-01-31: 07:00:00 6 mL via INTRAVENOUS

## 2022-02-02 ENCOUNTER — Inpatient Hospital Stay (HOSPITAL_COMMUNITY)
Admission: EM | Admit: 2022-02-02 | Discharge: 2022-02-08 | DRG: 391 | Disposition: A | Discharge status: Home / Self Care | Payer: Medicaid Other | Attending: Student | Admitting: Student

## 2022-02-02 ENCOUNTER — Emergency Department (HOSPITAL_COMMUNITY): Payer: Medicaid Other

## 2022-02-02 ENCOUNTER — Encounter (HOSPITAL_COMMUNITY)
Admission: EM | Disposition: A | Discharge status: Home / Self Care | Payer: Self-pay | Source: Home / Self Care | Attending: Internal Medicine

## 2022-02-02 ENCOUNTER — Other Ambulatory Visit: Payer: Self-pay

## 2022-02-02 ENCOUNTER — Encounter (HOSPITAL_COMMUNITY): Payer: Self-pay | Admitting: Emergency Medicine

## 2022-02-02 ENCOUNTER — Observation Stay (HOSPITAL_BASED_OUTPATIENT_CLINIC_OR_DEPARTMENT_OTHER): Payer: Medicaid Other | Admitting: Anesthesiology

## 2022-02-02 ENCOUNTER — Observation Stay (HOSPITAL_COMMUNITY): Payer: Medicaid Other | Admitting: Anesthesiology

## 2022-02-02 DIAGNOSIS — D7589 Other specified diseases of blood and blood-forming organs: Secondary | ICD-10-CM | POA: Diagnosis present

## 2022-02-02 DIAGNOSIS — K208 Other esophagitis without bleeding: Secondary | ICD-10-CM | POA: Diagnosis present

## 2022-02-02 DIAGNOSIS — T18128A Food in esophagus causing other injury, initial encounter: Secondary | ICD-10-CM | POA: Diagnosis present

## 2022-02-02 DIAGNOSIS — Z86718 Personal history of other venous thrombosis and embolism: Secondary | ICD-10-CM

## 2022-02-02 DIAGNOSIS — E876 Hypokalemia: Secondary | ICD-10-CM | POA: Diagnosis present

## 2022-02-02 DIAGNOSIS — E871 Hypo-osmolality and hyponatremia: Secondary | ICD-10-CM | POA: Diagnosis present

## 2022-02-02 DIAGNOSIS — Z20822 Contact with and (suspected) exposure to covid-19: Secondary | ICD-10-CM | POA: Diagnosis present

## 2022-02-02 DIAGNOSIS — I82629 Acute embolism and thrombosis of deep veins of unspecified upper extremity: Secondary | ICD-10-CM | POA: Diagnosis present

## 2022-02-02 DIAGNOSIS — Z8249 Family history of ischemic heart disease and other diseases of the circulatory system: Secondary | ICD-10-CM

## 2022-02-02 DIAGNOSIS — J439 Emphysema, unspecified: Secondary | ICD-10-CM | POA: Diagnosis present

## 2022-02-02 DIAGNOSIS — Z7901 Long term (current) use of anticoagulants: Secondary | ICD-10-CM

## 2022-02-02 DIAGNOSIS — Y842 Radiological procedure and radiotherapy as the cause of abnormal reaction of the patient, or of later complication, without mention of misadventure at the time of the procedure: Secondary | ICD-10-CM | POA: Diagnosis present

## 2022-02-02 DIAGNOSIS — R1310 Dysphagia, unspecified: Secondary | ICD-10-CM

## 2022-02-02 DIAGNOSIS — D638 Anemia in other chronic diseases classified elsewhere: Secondary | ICD-10-CM | POA: Diagnosis present

## 2022-02-02 DIAGNOSIS — Z9221 Personal history of antineoplastic chemotherapy: Secondary | ICD-10-CM

## 2022-02-02 DIAGNOSIS — E872 Acidosis, unspecified: Secondary | ICD-10-CM | POA: Diagnosis present

## 2022-02-02 DIAGNOSIS — T66XXXA Radiation sickness, unspecified, initial encounter: Secondary | ICD-10-CM

## 2022-02-02 DIAGNOSIS — J9601 Acute respiratory failure with hypoxia: Secondary | ICD-10-CM | POA: Diagnosis present

## 2022-02-02 DIAGNOSIS — R112 Nausea with vomiting, unspecified: Secondary | ICD-10-CM

## 2022-02-02 DIAGNOSIS — J441 Chronic obstructive pulmonary disease with (acute) exacerbation: Secondary | ICD-10-CM

## 2022-02-02 DIAGNOSIS — R1319 Other dysphagia: Secondary | ICD-10-CM | POA: Insufficient documentation

## 2022-02-02 DIAGNOSIS — Z85118 Personal history of other malignant neoplasm of bronchus and lung: Secondary | ICD-10-CM

## 2022-02-02 DIAGNOSIS — D539 Nutritional anemia, unspecified: Secondary | ICD-10-CM | POA: Diagnosis present

## 2022-02-02 DIAGNOSIS — K222 Esophageal obstruction: Principal | ICD-10-CM | POA: Diagnosis present

## 2022-02-02 DIAGNOSIS — R1314 Dysphagia, pharyngoesophageal phase: Secondary | ICD-10-CM | POA: Diagnosis present

## 2022-02-02 DIAGNOSIS — R000 Tachycardia, unspecified: Secondary | ICD-10-CM | POA: Diagnosis present

## 2022-02-02 DIAGNOSIS — C3411 Malignant neoplasm of upper lobe, right bronchus or lung: Secondary | ICD-10-CM

## 2022-02-02 DIAGNOSIS — D696 Thrombocytopenia, unspecified: Secondary | ICD-10-CM | POA: Diagnosis not present

## 2022-02-02 DIAGNOSIS — Z8709 Personal history of other diseases of the respiratory system: Secondary | ICD-10-CM

## 2022-02-02 DIAGNOSIS — F17210 Nicotine dependence, cigarettes, uncomplicated: Secondary | ICD-10-CM | POA: Diagnosis present

## 2022-02-02 DIAGNOSIS — J9801 Acute bronchospasm: Secondary | ICD-10-CM

## 2022-02-02 DIAGNOSIS — E860 Dehydration: Secondary | ICD-10-CM

## 2022-02-02 DIAGNOSIS — R42 Dizziness and giddiness: Secondary | ICD-10-CM

## 2022-02-02 HISTORY — PX: FOREIGN BODY REMOVAL: SHX962

## 2022-02-02 HISTORY — PX: ESOPHAGOGASTRODUODENOSCOPY (EGD) WITH PROPOFOL: SHX5813

## 2022-02-02 LAB — CBC WITH DIFFERENTIAL/PLATELET
Abs Immature Granulocytes: 0.07 10*3/uL (ref 0.00–0.07)
Basophils Absolute: 0 10*3/uL (ref 0.0–0.1)
Basophils Relative: 0 %
Eosinophils Absolute: 0.1 10*3/uL (ref 0.0–0.5)
Eosinophils Relative: 2 %
HCT: 36.4 % (ref 36.0–46.0)
Hemoglobin: 12.3 g/dL (ref 12.0–15.0)
Immature Granulocytes: 1 %
Lymphocytes Relative: 17 %
Lymphs Abs: 1.2 10*3/uL (ref 0.7–4.0)
MCH: 34.3 pg — ABNORMAL HIGH (ref 26.0–34.0)
MCHC: 33.8 g/dL (ref 30.0–36.0)
MCV: 101.4 fL — ABNORMAL HIGH (ref 80.0–100.0)
Monocytes Absolute: 0.7 10*3/uL (ref 0.1–1.0)
Monocytes Relative: 11 %
Neutro Abs: 4.8 10*3/uL (ref 1.7–7.7)
Neutrophils Relative %: 69 %
Platelets: 221 10*3/uL (ref 150–400)
RBC: 3.59 MIL/uL — ABNORMAL LOW (ref 3.87–5.11)
RDW: 19.5 % — ABNORMAL HIGH (ref 11.5–15.5)
WBC: 6.9 10*3/uL (ref 4.0–10.5)
nRBC: 0 % (ref 0.0–0.2)

## 2022-02-02 LAB — BASIC METABOLIC PANEL
Anion gap: 10 (ref 5–15)
BUN: 10 mg/dL (ref 6–20)
CO2: 21 mmol/L — ABNORMAL LOW (ref 22–32)
Calcium: 9.8 mg/dL (ref 8.9–10.3)
Chloride: 108 mmol/L (ref 98–111)
Creatinine, Ser: 0.89 mg/dL (ref 0.44–1.00)
GFR, Estimated: >60 mL/min (ref >60)
Glucose, Bld: 95 mg/dL (ref 70–99)
Potassium: 3.2 mmol/L — ABNORMAL LOW (ref 3.5–5.1)
Sodium: 139 mmol/L (ref 135–145)

## 2022-02-02 LAB — HEPATIC FUNCTION PANEL
ALT: 13 U/L (ref 0–44)
AST: 16 U/L (ref 15–41)
Albumin: 3.5 g/dL (ref 3.5–5.0)
Alkaline Phosphatase: 77 U/L (ref 38–126)
Bilirubin, Direct: 0.1 mg/dL (ref 0.0–0.2)
Indirect Bilirubin: 0.4 mg/dL (ref 0.3–0.9)
Total Bilirubin: 0.5 mg/dL (ref 0.3–1.2)
Total Protein: 6.8 g/dL (ref 6.5–8.1)

## 2022-02-02 LAB — RESP PANEL BY RT-PCR (FLU A&B, COVID) ARPGX2
Influenza A by PCR: NEGATIVE
Influenza B by PCR: NEGATIVE
SARS Coronavirus 2 by RT PCR: NEGATIVE

## 2022-02-02 LAB — MAGNESIUM: Magnesium: 1.7 mg/dL (ref 1.7–2.4)

## 2022-02-02 LAB — PHOSPHORUS: Phosphorus: 3 mg/dL (ref 2.5–4.6)

## 2022-02-02 SURGERY — ESOPHAGOGASTRODUODENOSCOPY (EGD) WITH PROPOFOL
Anesthesia: General

## 2022-02-02 MED ORDER — HYDROMORPHONE HCL 1 MG/ML IJ SOLN
1.0000 mg | INTRAMUSCULAR | Status: DC | PRN
  Administered 2022-02-02 – 2022-02-08 (×37): 1 mg via INTRAVENOUS
  Filled 2022-02-02 (×38): qty 1

## 2022-02-02 MED ORDER — POTASSIUM CHLORIDE 10 MEQ/100ML IV SOLN
10.0000 meq | INTRAVENOUS | Status: AC
  Administered 2022-02-02 (×2): 10 meq via INTRAVENOUS
  Filled 2022-02-02 (×3): qty 100

## 2022-02-02 MED ORDER — MAGNESIUM SULFATE 2 GM/50ML IV SOLN
2.0000 g | Freq: Once | INTRAVENOUS | Status: AC
  Administered 2022-02-02: 2 g via INTRAVENOUS
  Filled 2022-02-02: qty 50

## 2022-02-02 MED ORDER — NICOTINE 14 MG/24HR TD PT24
14.0000 mg | MEDICATED_PATCH | Freq: Every day | TRANSDERMAL | Status: DC
  Administered 2022-02-03 – 2022-02-08 (×6): 14 mg via TRANSDERMAL
  Filled 2022-02-02 (×6): qty 1

## 2022-02-02 MED ORDER — MIDAZOLAM HCL 2 MG/2ML IJ SOLN
INTRAMUSCULAR | Status: DC | PRN
  Administered 2022-02-02: 2 mg via INTRAVENOUS

## 2022-02-02 MED ORDER — PROPOFOL 10 MG/ML IV BOLUS
INTRAVENOUS | Status: AC
  Filled 2022-02-02: qty 20

## 2022-02-02 MED ORDER — LACTATED RINGERS IV BOLUS
2000.0000 mL | Freq: Once | INTRAVENOUS | Status: AC
  Administered 2022-02-02: 2000 mL via INTRAVENOUS

## 2022-02-02 MED ORDER — FENTANYL CITRATE (PF) 100 MCG/2ML IJ SOLN
INTRAMUSCULAR | Status: AC
Start: 2022-02-02 — End: ?
  Filled 2022-02-02: qty 2

## 2022-02-02 MED ORDER — ALBUTEROL SULFATE (2.5 MG/3ML) 0.083% IN NEBU: 2.5000 mg | INHALATION_SOLUTION | Freq: Four times a day (QID) | RESPIRATORY_TRACT | Status: DC | PRN

## 2022-02-02 MED ORDER — FENTANYL CITRATE (PF) 100 MCG/2ML IJ SOLN
INTRAMUSCULAR | Status: DC | PRN
Start: 2022-02-02 — End: 2022-02-02
  Administered 2022-02-02 (×2): 50 ug via INTRAVENOUS

## 2022-02-02 MED ORDER — PANTOPRAZOLE SODIUM 40 MG IV SOLR
40.0000 mg | Freq: Once | INTRAVENOUS | Status: AC
  Administered 2022-02-02: 40 mg via INTRAVENOUS
  Filled 2022-02-02: qty 10

## 2022-02-02 MED ORDER — PANTOPRAZOLE SODIUM 40 MG IV SOLR
40.0000 mg | Freq: Two times a day (BID) | INTRAVENOUS | Status: DC
  Administered 2022-02-02 – 2022-02-08 (×12): 40 mg via INTRAVENOUS
  Filled 2022-02-02 (×11): qty 10

## 2022-02-02 MED ORDER — NICOTINE 14 MG/24HR TD PT24
14.0000 mg | MEDICATED_PATCH | Freq: Every day | TRANSDERMAL | Status: DC
  Administered 2022-02-02: 14 mg via TRANSDERMAL
  Filled 2022-02-02: qty 1

## 2022-02-02 MED ORDER — HYDROMORPHONE HCL 2 MG PO TABS: 2.0000 mg | ORAL_TABLET | ORAL | Status: DC | PRN

## 2022-02-02 MED ORDER — SUCCINYLCHOLINE CHLORIDE 200 MG/10ML IV SOSY
PREFILLED_SYRINGE | INTRAVENOUS | Status: DC | PRN
  Administered 2022-02-02: 120 mg via INTRAVENOUS

## 2022-02-02 MED ORDER — ONDANSETRON 4 MG PO TBDP: 8.0000 mg | ORAL_TABLET | Freq: Three times a day (TID) | ORAL | Status: DC | PRN

## 2022-02-02 MED ORDER — NICOTINE 21 MG/24HR TD PT24: 21.0000 mg | MEDICATED_PATCH | Freq: Every day | TRANSDERMAL | Status: DC

## 2022-02-02 MED ORDER — ONDANSETRON HCL 4 MG PO TABS: 4.0000 mg | ORAL_TABLET | Freq: Four times a day (QID) | ORAL | Status: DC | PRN

## 2022-02-02 MED ORDER — DEXAMETHASONE SODIUM PHOSPHATE 10 MG/ML IJ SOLN
INTRAMUSCULAR | Status: DC | PRN
Start: 2022-02-02 — End: 2022-02-02
  Administered 2022-02-02: 5 mg via INTRAVENOUS

## 2022-02-02 MED ORDER — DIPHENHYDRAMINE-APAP (SLEEP) 25-500 MG PO TABS: 3.0000 | ORAL_TABLET | Freq: Every evening | ORAL | Status: DC | PRN

## 2022-02-02 MED ORDER — ONDANSETRON HCL 4 MG/2ML IJ SOLN: 4.0000 mg | Freq: Four times a day (QID) | INTRAMUSCULAR | Status: DC | PRN

## 2022-02-02 MED ORDER — POTASSIUM CHLORIDE 10 MEQ/100ML IV SOLN
10.0000 meq | INTRAVENOUS | Status: AC
  Administered 2022-02-02: 10 meq via INTRAVENOUS
  Filled 2022-02-02: qty 100

## 2022-02-02 MED ORDER — LACTATED RINGERS IV SOLN
INTRAVENOUS | Status: AC | PRN
Start: 2022-02-02 — End: 2022-02-02
  Administered 2022-02-02: 1000 mL via INTRAVENOUS

## 2022-02-02 MED ORDER — PROPOFOL 10 MG/ML IV BOLUS
INTRAVENOUS | Status: DC | PRN
  Administered 2022-02-02: 20 mg via INTRAVENOUS
  Administered 2022-02-02: 120 mg via INTRAVENOUS

## 2022-02-02 MED ORDER — ONDANSETRON HCL 4 MG/2ML IJ SOLN
4.0000 mg | Freq: Four times a day (QID) | INTRAMUSCULAR | Status: DC | PRN
  Administered 2022-02-04 – 2022-02-05 (×2): 4 mg via INTRAVENOUS
  Filled 2022-02-02 (×2): qty 2

## 2022-02-02 MED ORDER — IPRATROPIUM-ALBUTEROL 0.5-2.5 (3) MG/3ML IN SOLN
3.0000 mL | Freq: Three times a day (TID) | RESPIRATORY_TRACT | Status: DC
Start: 2022-02-02 — End: 2022-02-03
  Administered 2022-02-02 – 2022-02-03 (×2): 3 mL via RESPIRATORY_TRACT
  Filled 2022-02-02 (×2): qty 3

## 2022-02-02 MED ORDER — IPRATROPIUM-ALBUTEROL 0.5-2.5 (3) MG/3ML IN SOLN
3.0000 mL | RESPIRATORY_TRACT | Status: DC
  Administered 2022-02-02 (×2): 3 mL via RESPIRATORY_TRACT
  Filled 2022-02-02 (×2): qty 3

## 2022-02-02 MED ORDER — MIDAZOLAM HCL 2 MG/2ML IJ SOLN
INTRAMUSCULAR | Status: AC
  Filled 2022-02-02: qty 2

## 2022-02-02 MED ORDER — ENOXAPARIN SODIUM 60 MG/0.6ML IJ SOSY
60.0000 mg | PREFILLED_SYRINGE | Freq: Two times a day (BID) | INTRAMUSCULAR | Status: DC
  Administered 2022-02-02 (×2): 60 mg via SUBCUTANEOUS
  Filled 2022-02-02 (×2): qty 0.6

## 2022-02-02 MED ORDER — LIDOCAINE HCL (CARDIAC) PF 100 MG/5ML IV SOSY
PREFILLED_SYRINGE | INTRAVENOUS | Status: DC | PRN
  Administered 2022-02-02: 50 mg via INTRAVENOUS

## 2022-02-02 MED ORDER — FENTANYL CITRATE PF 50 MCG/ML IJ SOSY
100.0000 ug | PREFILLED_SYRINGE | Freq: Once | INTRAMUSCULAR | Status: AC
  Administered 2022-02-02: 100 ug via INTRAVENOUS
  Filled 2022-02-02: qty 2

## 2022-02-02 MED ORDER — POTASSIUM CHLORIDE IN NACL 20-0.9 MEQ/L-% IV SOLN
INTRAVENOUS | Status: DC
Start: 2022-02-02 — End: 2022-02-03
  Filled 2022-02-02 (×4): qty 1000

## 2022-02-02 MED ORDER — ONDANSETRON HCL 4 MG/2ML IJ SOLN
INTRAMUSCULAR | Status: DC | PRN
  Administered 2022-02-02: 4 mg via INTRAVENOUS

## 2022-02-02 MED ORDER — OXYCODONE-ACETAMINOPHEN 5-325 MG PO TABS: 1.0000 | ORAL_TABLET | Freq: Four times a day (QID) | ORAL | Status: DC | PRN

## 2022-02-02 MED ORDER — ALBUMIN HUMAN 5 % IV SOLN: INTRAVENOUS | Status: DC | PRN

## 2022-02-02 MED ORDER — ONDANSETRON HCL 4 MG/2ML IJ SOLN
4.0000 mg | Freq: Once | INTRAMUSCULAR | Status: AC
  Administered 2022-02-02: 4 mg via INTRAVENOUS
  Filled 2022-02-02: qty 2

## 2022-02-02 MED ORDER — PANTOPRAZOLE SODIUM 40 MG IV SOLR
40.0000 mg | INTRAVENOUS | Status: DC
Start: 2022-02-03 — End: 2022-02-02

## 2022-02-02 MED ORDER — SUCRALFATE 1 GM/10ML PO SUSP
1.0000 g | Freq: Three times a day (TID) | ORAL | Status: DC
  Administered 2022-02-02 – 2022-02-08 (×20): 1 g via ORAL
  Filled 2022-02-02 (×21): qty 10

## 2022-02-02 MED ORDER — SODIUM CHLORIDE 0.9 % IV SOLN: INTRAVENOUS | Status: DC

## 2022-02-02 MED ORDER — GLYCOPYRROLATE 0.2 MG/ML IJ SOLN
0.1000 mg | INTRAMUSCULAR | Status: DC | PRN
  Administered 2022-02-05: 0.1 mg via INTRAVENOUS
  Filled 2022-02-02: qty 1

## 2022-02-02 SURGICAL SUPPLY — 15 items

## 2022-02-02 NOTE — Progress Notes (Signed)
?   02/02/22 1030  ?Assess: MEWS Score  ?Temp 97.9 ?F (36.6 ?C)  ?BP 105/79  ?Pulse Rate (!) 118  ?Resp 16  ?SpO2 100 %  ?Assess: MEWS Score  ?MEWS Temp 0  ?MEWS Systolic 0  ?MEWS Pulse 2  ?MEWS RR 0  ?MEWS LOC 0  ?MEWS Score 2  ?MEWS Score Color Yellow  ?Assess: if the MEWS score is Yellow or Red  ?Were vital signs taken at a resting state? Yes  ?Focused Assessment Change from prior assessment (see assessment flowsheet)  ?Does the patient meet 2 or more of the SIRS criteria? No  ?MEWS guidelines implemented *See Row Information* Yes  ?Treat  ?MEWS Interventions Administered scheduled meds/treatments ?(made MD aware of pulse)  ?Pain Scale 0-10  ?Pain Score 6  ?Pain Type Acute pain  ?Take Vital Signs  ?Increase Vital Sign Frequency  Yellow: Q 2hr X 2 then Q 4hr X 2, if remains yellow, continue Q 4hrs  ?Escalate  ?MEWS: Escalate Yellow: discuss with charge nurse/RN and consider discussing with provider and RRT  ?Notify: Charge Nurse/RN  ?Name of Charge Nurse/RN Notified Burman Blacksmith  ?Date Charge Nurse/RN Notified 02/02/22  ?Time Charge Nurse/RN Notified 1042  ?Assess: SIRS CRITERIA  ?SIRS Temperature  0  ?SIRS Pulse 1  ?SIRS Respirations  0  ?SIRS WBC 0  ?SIRS Score Sum  1  ? ? ?

## 2022-02-02 NOTE — H&P (Signed)
History and Physical    Patient: [name] JGG:[id number] DOB: 03/13/1971 DOA: 02/02/2022 DOS: the patient was seen and examined on 02/02/2022 PCP: [name], PA-C  Patient coming from: Home  Chief Complaint:  Chief Complaint  Patient presents with   Esophagitis   HPI: [name] is a 51 y.o. 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  She also has a frontal headache that she believes is due to volume depletion.  She denied fever, chills, night sweats but feels tired.  No hemoptysis, PND or orthopnea.  She gets occasional palpitations, but denied dizziness, diaphoresis, chest pressure with radiation into arms, neck or back.  She has been constipated but believes this is because she has not eaten much.  No flank pain, dysuria, frequency or hematuria.  No polyuria, polydipsia, polyphagia or blurred vision.  She feels like she is dehydrated.  ED course: Initial vital signs were temperature 98.1 F, pulse 133, respirations 22, BP 107/79 mmHg and O2 sat 98% on room air.  The patient was given a DuoNeb, 100 mcg of fentanyl, ondansetron 4 mg IVP, pantoprazole 40 mg IVP and 2000 mL of LR bolus.  Labwork: Her CBC is her white count 6.9, hemoglobin 12.3 g/dL with an MCV of 101.4 fL and platelets 221.  Coronavirus and influenza PCR  negative.  LFTs were normal.  BMP showed a potassium of 3.2 and CO2 of 21 mmol/L, the rest of the measurements were unremarkable.  Magnesium was 1.7 and phosphorus 3.0 mg/dL.  Imaging: A 2 view chest radiograph showed emphysema and treated right lung cancer.  There was no acute finding.  Review of Systems: As mentioned in the history of present illness. All other systems reviewed and are negative. Past Medical History:  Diagnosis Date   Ankle sprain    left   Anxiety    treated for panic attacks in the past.   Asthma    COPD (chronic obstructive pulmonary disease) (HCC)    Depression    History of radiation therapy    Right lung- 09/26/21-11/13/21- Dr. Gery Pray   Irregular heart rate    Lung cancer [facility])    Renal disorder    kidney infections   Past Surgical History:  Procedure Laterality Date   APPENDECTOMY     BRONCHIAL BIOPSY  09/21/2021   Procedure: BRONCHIAL BIOPSIES;  Surgeon: Collene Gobble, MD;  Location: [place];  Service: Pulmonary;;   BRONCHIAL BRUSHINGS  09/21/2021   Procedure: BRONCHIAL BRUSHINGS;  Surgeon: Collene Gobble, MD;  Location: [place];  Service: Pulmonary;;   BRONCHIAL NEEDLE ASPIRATION BIOPSY  09/21/2021   Procedure: BRONCHIAL NEEDLE ASPIRATION BIOPSIES;  Surgeon: Collene Gobble, MD;  Location: [facility] ENDOSCOPY;  Service: Pulmonary;;   ESOPHAGOGASTRODUODENOSCOPY N/A 12/26/2021   Procedure: ESOPHAGOGASTRODUODENOSCOPY (EGD);  Surgeon: Clarene Essex, MD;  Location: [facility] ENDOSCOPY;  Service: Endoscopy;  Laterality: N/A;   ESOPHAGOGASTRODUODENOSCOPY (EGD) WITH PROPOFOL N/A 01/07/2022   Procedure: ESOPHAGOGASTRODUODENOSCOPY (  EGD) WITH PROPOFOL;  Surgeon: Otis Brace, MD;  Location: [facility] ENDOSCOPY;  Service: Gastroenterology;  Laterality: N/A;   FOREIGN BODY REMOVAL  12/26/2021   Procedure: FOREIGN BODY REMOVAL;  Surgeon: Clarene Essex, MD;  Location: [facility] ENDOSCOPY;  Service: Endoscopy;;   IR CHEST FLUORO  12/27/2021   LUMBAR LAMINECTOMY N/A 05/13/2018    Procedure: LEFT LUMBAR FOUR-FIVE MICRODISCECTOMY;  Surgeon: Marybelle Killings, MD;  Location: [place];  Service: Orthopedics;  Laterality: N/A;   TUBAL LIGATION     TUBAL LIGATION  1998   VIDEO BRONCHOSCOPY WITH ENDOBRONCHIAL ULTRASOUND N/A 09/21/2021   Procedure: VIDEO BRONCHOSCOPY WITH ENDOBRONCHIAL ULTRASOUND;  Surgeon: Collene Gobble, MD;  Location: [place] ENDOSCOPY;  Service: Pulmonary;  Laterality: N/A;   Social History:  reports that she has been smoking cigarettes. She has a 15.00 pack-year smoking history. She has never used smokeless tobacco. She reports that she does not currently use alcohol. She reports that she does not currently use drugs after having used the following drugs: Marijuana.  Allergies  Allergen Reactions   Naproxen Nausea Only   Bactrim [Sulfamethoxazole-Trimethoprim] Nausea Only    Family History  Problem Relation Age of Onset   Hypertension Mother    Hypertension Father    Heart disease Father     Prior to Admission medications   Medication Sig Start Date End Date Taking? Authorizing Provider  albuterol (PROVENTIL HFA;VENTOLIN HFA) 108 (90 Base) MCG/ACT inhaler Inhale 2 puffs into the lungs every 6 (six) hours as needed for wheezing or shortness of breath. 03/13/17   Alfonse Spruce, FNP  APIXABAN [place] [place]) VTE STARTER PACK (10MG  AND 5MG ) Take as directed on package: start with two-5mg  tablets twice daily for 7 days. On day 8, switch to one-5mg  tablet twice daily. 12/27/21   Barb Merino, MD  diphenhydramine-acetaminophen (TYLENOL PM) 25-500 MG TABS tablet Take 3 tablets by mouth at bedtime as needed (sleep).    [provider]  HYDROmorphone (DILAUDID) 2 MG tablet Take 1 tablet (2 mg total) by mouth every 4 (four) hours as needed for severe pain. Patient not taking: Reported on 01/24/2022 12/27/21   Barb Merino, MD  nicotine (NICODERM CQ - DOSED IN MG/24 HOURS) 21 mg/24hr patch Place 1 patch (21 mg total) onto the skin daily. Patient not taking:  Reported on 01/07/2022 12/28/21   Barb Merino, MD  ondansetron (ZOFRAN ODT) 8 MG disintegrating tablet Dissolve 1 tablet (8 mg total) by mouth every 8 (eight) hours as needed for nausea or vomiting. Patient not taking: Reported on 01/24/2022 10/11/21   Heilingoetter, Cassandra L, PA-C  oxyCODONE-acetaminophen (PERCOCET/ROXICET) 5-325 MG tablet Take 1 tablet by mouth every 6 hours as needed for severe pain. 01/24/22   Gery Pray, MD  pantoprazole (PROTONIX) 40 MG tablet Take 1 tablet (40 mg total) by mouth 2 (two) times daily. Patient not taking: Reported on 01/24/2022 12/27/21 01/26/22  Barb Merino, MD  sucralfate (CARAFATE) 1 GM/10ML suspension Take 10 mLs (1 g total) by mouth 4 (four) times daily -  with meals and at bedtime. 01/24/22   Gery Pray, MD    Physical Exam: Vitals:   02/02/22 0523 02/02/22 0600 02/02/22 0630 02/02/22 0813  BP: 107/79 102/85 (!) 144/117 100/72  Pulse: (!) 133 (!) 115 (!) 105 (!) 102  Resp: (!) 22 (!) 21 18 20   Temp: 98.1 F (36.7 C)   97.7 F (36.5 C)  TempSrc: Oral   Oral  SpO2: 98% 98% 100% 100%  Weight: 63.5 kg  Height: 5\' 8"  (1.727 m)      Physical Exam Vitals and nursing note reviewed.  Constitutional:      Appearance: She is ill-appearing.  HENT:     Head: Normocephalic.     Mouth/Throat:     Mouth: Mucous membranes are moist.     Comments: Lips are dry. Eyes:     General: No scleral icterus.    Pupils: Pupils are equal, round, and reactive to light.  Neck:     Vascular: No JVD.  Cardiovascular:     Rate and Rhythm: Normal rate and regular rhythm.     Heart sounds: S1 normal and S2 normal.  Pulmonary:     Effort: Pulmonary effort is normal. No accessory muscle usage.     Breath sounds: Examination of the right-upper field reveals rhonchi. Examination of the left-upper field reveals rhonchi. Rhonchi present.  Abdominal:     General: Bowel sounds are normal. There is no distension.     Palpations: Abdomen is soft.     Tenderness: There  is no abdominal tenderness.  Musculoskeletal:     Cervical back: Neck supple.     Right lower leg: No edema.     Left lower leg: No edema.  Skin:    General: Skin is warm and dry.     Comments: Decreased skin turgor.  Neurological:     General: No focal deficit present.     Mental Status: She is alert and oriented to person, place, and time.  Psychiatric:        Mood and Affect: Mood normal.        Behavior: Behavior normal. Behavior is cooperative.    Data Reviewed:  There are no new results to review at this time.  Assessment and Plan: Principal Problem:   Dysphagia Due to   Radiation-induced esophagitis causing   Esophageal stricture Observation/telemetry. Keep NPO. Aspiration precautions. Continue IV fluids. Pantoprazole 40 mg IVP every 12 hours. Analgesics and antiemetics as needed. Glycopyrrolate 0.1 mg IVP PRN to handle secretions. Consulted Dr. Therisa Doyne from [place] for reevaluation.  Active Problems:   Hypokalemia Replacing. Magnesium was supplemented. Follow-up potassium level.    Sinus tachycardia Resolved with IV hydration. Continue electrolyte replacement.    DVT of upper extremity (deep vein thrombosis) (HCC) Hold apixaban. Lovenox per pharmacy.    Macrocytosis Check B12 level. B12 supplementation as needed. Begin folic acid supplementation if B12 level normal.    Advance Care Planning:   Code Status: Full Code   Consults: [facility] (Dr. Therisa Doyne).  Family Communication:   Severity of Illness: The appropriate patient status for this patient is OBSERVATION. Observation status is judged to be reasonable and necessary in order to provide the required intensity of service to ensure the patient's safety. The patient's presenting symptoms, physical exam findings, and initial radiographic and laboratory data in the context of their medical condition is felt to place them at decreased risk for further clinical deterioration. Furthermore, it is  anticipated that the patient will be medically stable for discharge from the hospital within 2 midnights of admission.   Author: Reubin Milan, MD 02/02/2022 9:54 AM  For on call review [email or web address].   This document was prepared using Dragon voice recognition software and may contain some unintended transcription errors.

## 2022-02-02 NOTE — Anesthesia Postprocedure Evaluation (Signed)
Anesthesia Post Note ? ?Patient: [name] ? ?Procedure(s) Performed: ESOPHAGOGASTRODUODENOSCOPY (EGD) WITH PROPOFOL ?FOREIGN BODY REMOVAL ? ?  ? ?Patient location during evaluation: PACU ?Anesthesia Type: General ?Level of consciousness: sedated ?Pain management: pain level controlled ?Vital Signs Assessment: post-procedure vital signs reviewed and stable ?Respiratory status: spontaneous breathing and respiratory function stable ?Cardiovascular status: stable ?Postop Assessment: no apparent nausea or vomiting ?Anesthetic complications: no ? ? ?No notable events documented. ? ?Last Vitals:  ?Vitals:  ? 02/02/22 1150 02/02/22 1200  ?BP: (!) 132/94 (!) 141/98  ?Pulse: (!) 134 (!) 134  ?Resp: 18 19  ?Temp:    ?SpO2: 97% 97%  ?  ?Last Pain:  ?Vitals:  ? 02/02/22 1200  ?TempSrc:   ?PainSc: 7   ? ? ?  ?  ?  ?  ?  ?  ? ?Haelee Bolen DANIEL ? ? ? ? ?

## 2022-02-02 NOTE — Op Note (Signed)
[facility] ?Patient Name: [name] ?Procedure Date: 02/02/2022 ?MRN: [id number] ?Attending MD: Ronnette Juniper , MD ?Date of Birth: 1971/11/14 ?CSN: [id number] ?Age: 51 ?Admit Type: Inpatient ?Procedure:                Upper GI endoscopy ?Indications:              Dysphagia, Foreign body in the esophagus, Follow-up  ?                          of esophageal stricture ?Providers:                Ronnette Juniper, MD, Carlyn Reichert, RN, Despina Pole,  ?                          Technician, Tyna Jaksch Technician, [place]                          Surveyor, minerals, CRNA ?Referring MD:             [facility] ?Medicines:                Monitored Anesthesia Care ?Complications:            No immediate complications. Estimated blood loss:  ?                          Minimal. ?Estimated Blood Loss:     Estimated blood loss was minimal. ?Procedure:                Pre-Anesthesia Assessment: ?                          - Prior to the procedure, a History and Physical  ?                          was performed, and patient medications and  ?                          allergies were reviewed. The patient's tolerance of  ?                          previous anesthesia was also reviewed. The risks  ?                          and benefits of the procedure and the sedation  ?                          options and risks were discussed with the patient.  ?                          All questions were answered, and informed consent  ?                          was obtained. Prior Anticoagulants: The patient has  ?                          taken  Eliquis (apixaban), last dose was 1 day prior  ?                          to procedure. ASA Grade Assessment: III - A patient  ?                          with severe systemic disease. After reviewing the  ?                          risks and benefits, the patient was deemed in  ?                          satisfactory condition to undergo the procedure. ?                          After  obtaining informed consent, the endoscope was  ?                          passed under direct vision. Throughout the  ?                          procedure, the patient's blood pressure, pulse, and  ?                          oxygen saturations were monitored continuously. The  ?                          GIF-H190 ([id number]) Olympus endoscope was introduced  ?                          through the mouth, and advanced to the second part  ?                          of duodenum. The upper GI endoscopy was  ?                          accomplished without difficulty. The patient  ?                          tolerated the procedure well. ?Scope In: ?Scope Out: ?Findings: ?     Food was found in the middle third of the esophagus. Removal of food was  ?     accomplished. A large piece of meat was stuck at the area of stenosis  ?     and was successfully removed with a Roth's net. ?     One benign-appearing, intrinsic severe (stenosis; an endoscope cannot  ?     pass) stenosis was found 25 cm from the incisors. This stenosis measured  ?     7 mm (inner diameter) x 2 cm (in length). The stenosis was traversed  ?     after downsizing scope. Adult gastroscope was exchanged for a thinner  ?     gastroscope(ultrathin/neonatal). Mucosal friability and easy oozing was  ?     noted with passage of scope. ?     LA Grade  D (one or more mucosal breaks involving at least 75% of  ?     esophageal circumference) esophagitis with no bleeding was found 25 to  ?     30 cm from the incisors. ?     The entire examined stomach was normal. ?     The cardia and gastric fundus were normal on retroflexion. ?     The examined duodenum was normal. ?Impression:               - Benign-appearing esophageal stenosis. ?                          - Food in the middle third of the esophagus.  ?                          Removal was successful. ?                          - LA Grade D radiation esophagitis with no bleeding. ?                          - Normal  stomach. ?                          - Normal examined duodenum. ?Moderate Sedation: ?     Patient did not receive moderate sedation for this procedure, but  ?     instead received monitored anesthesia care. ?Recommendation:           - Clear liquid diet. ?                          - PPI twice a day and sucralfate four times a day. ?                          - Will benefit from EGD with dilation but presence  ?                          of severe esophagitis makes this challenging as it  ?                          increases the risk of bleeding and perforation. ?Procedure Code(s):        --- Professional --- ?                          [phone], Esophagogastroduodenoscopy, flexible,  ?                          transoral; with removal of foreign body(s) ?Diagnosis Code(s):        --- Professional --- ?                          K22.2, Esophageal obstruction ?                          Z66.063K, Food in esophagus causing other injury,  ?  initial encounter ?                          T66.XXXA, Radiation sickness, unspecified, initial  ?                          encounter ?                          K20.80, Other esophagitis without bleeding ?                          R13.10, Dysphagia, unspecified ?                          T18.108A, Unspecified foreign body in esophagus  ?                          causing other injury, initial encounter ?CPT copyright 2019 [employer]. All rights reserved. ?The codes documented in this report are preliminary and upon coder review may  ?be revised to meet current compliance requirements. ?Ronnette Juniper, MD ?02/02/2022 11:47:18 AM ?This report has been signed electronically. ?Number of Addenda: 0 ?

## 2022-02-02 NOTE — Anesthesia Procedure Notes (Signed)
Procedure Name: Intubation ?Date/Time: 02/02/2022 11:21 AM ?Performed by: Raenette Rover, CRNA ?Pre-anesthesia Checklist: Patient identified, Emergency Drugs available, Suction available and Patient being monitored ?Patient Re-evaluated:Patient Re-evaluated prior to induction ?Oxygen Delivery Method: Circle system utilized ?Preoxygenation: Pre-oxygenation with 100% oxygen ?Induction Type: IV induction, Rapid sequence and Cricoid Pressure applied ?Laryngoscope Size: Mac and 3 ?Grade View: Grade I ?Tube type: Oral ?Tube size: 7.0 mm ?Number of attempts: 1 ?Airway Equipment and Method: Stylet ?Placement Confirmation: ETT inserted through vocal cords under direct vision, positive ETCO2 and breath sounds checked- equal and bilateral ?Secured at: 22 cm ?Tube secured with: Tape ?Dental Injury: Teeth and Oropharynx as per pre-operative assessment  ? ? ? ? ?

## 2022-02-02 NOTE — Transfer of Care (Signed)
Immediate Anesthesia Transfer of Care Note ? ?Patient: [name] ? ?Procedure(s) Performed: ESOPHAGOGASTRODUODENOSCOPY (EGD) WITH PROPOFOL ?FOREIGN BODY REMOVAL ? ?Patient Location: Endoscopy Unit ? ?Anesthesia Type:General ? ?Level of Consciousness: awake, alert , oriented and patient cooperative ? ?Airway & Oxygen Therapy: Patient Spontanous Breathing ? ?Post-op Assessment: Report given to RN and Post -op Vital signs reviewed and stable ? ?Post vital signs: Reviewed and stable ? ?Last Vitals:  ?Vitals Value Taken Time  ?BP 88/62 02/02/22 1144  ?Temp 36.6 ?C 02/02/22 1144  ?Pulse 137 02/02/22 1147  ?Resp 17 02/02/22 1147  ?SpO2 95 % 02/02/22 1147  ?Vitals shown include unvalidated device data. ? ?Last Pain:  ?Vitals:  ? 02/02/22 1144  ?TempSrc: Temporal  ?PainSc: 0-No pain  ?   ? ?Patients Stated Pain Goal: 6 (02/02/22 1107) ? ?Complications: No notable events documented. ?

## 2022-02-02 NOTE — Anesthesia Preprocedure Evaluation (Addendum)
Anesthesia Evaluation  ?Patient identified by MRN, date of birth, ID band ?Patient awake ? ? ? ?Reviewed: ?Allergy & Precautions, NPO status , Patient's Chart, lab work & pertinent test results ? ?History of Anesthesia Complications ?Negative for: history of anesthetic complications ? ?Airway ?Mallampati: I ? ?TM Distance: >3 FB ?Neck ROM: Full ? ? ? Dental ? ?(+) Edentulous Upper, Poor Dentition, Dental Advisory Given ?  ?Pulmonary ?COPD,  COPD inhaler, Current Smoker,  ?[place] [facility] ?  ?Pulmonary exam normal ? ? ? ? ? ? ? Cardiovascular ?negative cardio ROS ? ? ?Rhythm:Regular Rate:Tachycardia ? ? ?  ?Neuro/Psych ?PSYCHIATRIC DISORDERS Anxiety Depression negative neurological ROS ?   ? GI/Hepatic ?negative GI ROS, Neg liver ROS,   ?Endo/Other  ?negative endocrine ROS ? Renal/GU ?Renal InsufficiencyRenal disease  ?negative genitourinary ?  ?Musculoskeletal ?negative musculoskeletal ROS ?(+)  ? Abdominal ?  ?Peds ?negative pediatric ROS ?(+)  Hematology ?negative hematology ROS ?(+)   ?Anesthesia Other Findings ? ? Reproductive/Obstetrics ?negative OB ROS ? ?  ? ? ? ? ? ? ? ? ? ? ? ? ? ?  ?  ? ? ? ? ? ? ? ?Anesthesia Physical ? ?Anesthesia Plan ? ?ASA: 3 and emergent ? ?Anesthesia Plan: General  ? ?Post-op Pain Management: Minimal or no pain anticipated  ? ?Induction: Intravenous, Rapid sequence and Cricoid pressure planned ? ?PONV Risk Score and Plan: 3 and Propofol infusion, Treatment may vary due to age or medical condition, Ondansetron and Midazolam ? ?Airway Management Planned: Oral ETT ? ?Additional Equipment:  ? ?Intra-op Plan:  ? ?Post-operative Plan: Extubation in OR ? ?Informed Consent: I have reviewed the patients History and Physical, chart, labs and discussed the procedure including the risks, benefits and alternatives for the proposed anesthesia with the patient or authorized representative who has indicated his/her understanding and acceptance.  ? ? ? ?Dental advisory  given ? ?Plan Discussed with: CRNA and Anesthesiologist ? ?Anesthesia Plan Comments:   ? ? ? ? ?Anesthesia Quick Evaluation ? ?

## 2022-02-02 NOTE — ED Provider Notes (Signed)
[facility] DEPT Provider Note: Georgena Spurling, MD, FACEP  CSN: [id number] MRN: [id number] ARRIVAL: 02/02/22 at [place]: [place]  Esophagitis   HISTORY OF PRESENT ILLNESS  02/02/22 5:45 AM [name] is a 51 y.o. female currently undergoing treatment for lung cancer.  She was diagnosed with esophagitis after endoscopy on 01/07/2022.  Findings were consistent with radiation-induced mild esophageal stricture and esophagitis.  Dilation was not done because she is on Eliquis anticoagulation.  She is here with what she believes to be an exacerbation of her esophagitis.  She has not been able to swallow anything, even her saliva, since yesterday.  Any time she tries to eat or drink anything she vomits it back up.  She is having pain in her chest and epigastrium as a result of vomiting and retching.  She rates this as an 8 out of 10.  She has not been able to take her regular medications.    Past Medical History:  Diagnosis Date   Ankle sprain    left   Anxiety    treated for panic attacks in the past.   Asthma    COPD (chronic obstructive pulmonary disease) (HCC)    Depression    History of radiation therapy    Right lung- 09/26/21-11/13/21- Dr. Gery Pray   Irregular heart rate    Lung cancer [facility])    Renal disorder    kidney infections    Past Surgical History:  Procedure Laterality Date   APPENDECTOMY     BRONCHIAL BIOPSY  09/21/2021   Procedure: BRONCHIAL BIOPSIES;  Surgeon: Collene Gobble, MD;  Location: [place];  Service: Pulmonary;;   BRONCHIAL BRUSHINGS  09/21/2021   Procedure: BRONCHIAL BRUSHINGS;  Surgeon: Collene Gobble, MD;  Location: [place];  Service: Pulmonary;;   BRONCHIAL NEEDLE ASPIRATION BIOPSY  09/21/2021   Procedure: BRONCHIAL NEEDLE ASPIRATION BIOPSIES;  Surgeon: Collene Gobble, MD;  Location: [facility] ENDOSCOPY;  Service: Pulmonary;;   ESOPHAGOGASTRODUODENOSCOPY N/A 12/26/2021   Procedure: ESOPHAGOGASTRODUODENOSCOPY  (EGD);  Surgeon: Clarene Essex, MD;  Location: [facility] ENDOSCOPY;  Service: Endoscopy;  Laterality: N/A;   ESOPHAGOGASTRODUODENOSCOPY (EGD) WITH PROPOFOL N/A 01/07/2022   Procedure: ESOPHAGOGASTRODUODENOSCOPY (EGD) WITH PROPOFOL;  Surgeon: Otis Brace, MD;  Location: [facility] ENDOSCOPY;  Service: Gastroenterology;  Laterality: N/A;   FOREIGN BODY REMOVAL  12/26/2021   Procedure: FOREIGN BODY REMOVAL;  Surgeon: Clarene Essex, MD;  Location: [facility] ENDOSCOPY;  Service: Endoscopy;;   IR CHEST FLUORO  12/27/2021   LUMBAR LAMINECTOMY N/A 05/13/2018   Procedure: LEFT LUMBAR FOUR-FIVE MICRODISCECTOMY;  Surgeon: Marybelle Killings, MD;  Location: [place];  Service: Orthopedics;  Laterality: N/A;   TUBAL LIGATION     TUBAL LIGATION  1998   VIDEO BRONCHOSCOPY WITH ENDOBRONCHIAL ULTRASOUND N/A 09/21/2021   Procedure: VIDEO BRONCHOSCOPY WITH ENDOBRONCHIAL ULTRASOUND;  Surgeon: Collene Gobble, MD;  Location: [place] ENDOSCOPY;  Service: Pulmonary;  Laterality: N/A;    Family History  Problem Relation Age of Onset   Hypertension Mother    Hypertension Father    Heart disease Father     Social History   Tobacco Use   Smoking status: Every Day    Packs/day: 0.50    Years: 30.00    Pack years: 15.00    Types: Cigarettes   Smokeless tobacco: Never  Vaping Use   Vaping Use: Some days   Substances: Flavoring  Substance Use Topics   Alcohol use: Not Currently    Comment: rare   Drug  use: Not Currently    Types: Marijuana    Comment: occ-    Prior to Admission medications   Medication Sig Start Date End Date Taking? Authorizing Provider  albuterol (PROVENTIL HFA;VENTOLIN HFA) 108 (90 Base) MCG/ACT inhaler Inhale 2 puffs into the lungs every 6 (six) hours as needed for wheezing or shortness of breath. 03/13/17   Alfonse Spruce, FNP  APIXABAN [place]) VTE STARTER PACK (10MG  AND 5MG ) Take as directed on package: start with two-5mg  tablets twice daily for 7 days. On day 8, switch to one-5mg  tablet twice daily. 12/27/21    Barb Merino, MD  diphenhydramine-acetaminophen (TYLENOL PM) 25-500 MG TABS tablet Take 3 tablets by mouth at bedtime as needed (sleep).    [provider]  HYDROmorphone (DILAUDID) 2 MG tablet Take 1 tablet (2 mg total) by mouth every 4 (four) hours as needed for severe pain. Patient not taking: Reported on 01/24/2022 12/27/21   Barb Merino, MD  nicotine (NICODERM CQ - DOSED IN MG/24 HOURS) 21 mg/24hr patch Place 1 patch (21 mg total) onto the skin daily. Patient not taking: Reported on 01/07/2022 12/28/21   Barb Merino, MD  ondansetron (ZOFRAN ODT) 8 MG disintegrating tablet Dissolve 1 tablet (8 mg total) by mouth every 8 (eight) hours as needed for nausea or vomiting. Patient not taking: Reported on 01/24/2022 10/11/21   Heilingoetter, Cassandra L, PA-C  oxyCODONE-acetaminophen (PERCOCET/ROXICET) 5-325 MG tablet Take 1 tablet by mouth every 6 hours as needed for severe pain. 01/24/22   Gery Pray, MD  pantoprazole (PROTONIX) 40 MG tablet Take 1 tablet (40 mg total) by mouth 2 (two) times daily. Patient not taking: Reported on 01/24/2022 12/27/21 01/26/22  Barb Merino, MD  sucralfate (CARAFATE) 1 GM/10ML suspension Take 10 mLs (1 g total) by mouth 4 (four) times daily -  with meals and at bedtime. 01/24/22   Gery Pray, MD    Allergies Naproxen and Bactrim [sulfamethoxazole-trimethoprim]   REVIEW OF SYSTEMS  Negative except as noted here or in the History of Present Illness.   PHYSICAL EXAMINATION  Initial Vital Signs Blood pressure 107/79, pulse (!) 133, temperature 98.1 F (36.7 C), temperature source Oral, resp. rate (!) 22, height 5\' 8"  (4.132 m), weight 63.5 kg, last menstrual period 04/24/2018, SpO2 98 %.  Examination General: Well-developed, well-nourished female in no acute distress; appearance consistent with age of record HENT: normocephalic; atraumatic; pharynx normal Eyes: pupils equal, round and reactive to light; extraocular muscles intact Neck: supple Heart:  regular rate and rhythm; tachycardia Lungs: Inspiratory and expiratory rhonchi Abdomen: soft; nondistended; nontender; bowel sounds present Extremities: No deformity; full range of motion; pulses normal Neurologic: Awake, alert and oriented; motor function intact in all extremities and symmetric; no facial droop Skin: Warm and dry; alopecia Psychiatric: Flat affect   RESULTS  Summary of this visit's results, reviewed and interpreted by myself:   EKG Interpretation  Date/Time:    Ventricular Rate:    PR Interval:    QRS Duration:   QT Interval:    QTC Calculation:   R Axis:     Text Interpretation:         Laboratory Studies: Results for orders placed or performed during the hospital encounter of 02/02/22 (from the past 24 hour(s))  CBC with Differential     Status: Abnormal   Collection Time: 02/02/22  5:55 AM  Result Value Ref Range   WBC 6.9 4.0 - 10.5 K/uL   RBC 3.59 (L) 3.87 - 5.11 MIL/uL   Hemoglobin  12.3 12.0 - 15.0 g/dL   HCT 36.4 36.0 - 46.0 %   MCV 101.4 (H) 80.0 - 100.0 fL   MCH 34.3 (H) 26.0 - 34.0 pg   MCHC 33.8 30.0 - 36.0 g/dL   RDW 19.5 (H) 11.5 - 15.5 %   Platelets 221 150 - 400 K/uL   nRBC 0.0 0.0 - 0.2 %   Neutrophils Relative % 69 %   Neutro Abs 4.8 1.7 - 7.7 K/uL   Lymphocytes Relative 17 %   Lymphs Abs 1.2 0.7 - 4.0 K/uL   Monocytes Relative 11 %   Monocytes Absolute 0.7 0.1 - 1.0 K/uL   Eosinophils Relative 2 %   Eosinophils Absolute 0.1 0.0 - 0.5 K/uL   Basophils Relative 0 %   Basophils Absolute 0.0 0.0 - 0.1 K/uL   Immature Granulocytes 1 %   Abs Immature Granulocytes 0.07 0.00 - 0.07 K/uL  Basic metabolic panel     Status: Abnormal   Collection Time: 02/02/22  5:55 AM  Result Value Ref Range   Sodium 139 135 - 145 mmol/L   Potassium 3.2 (L) 3.5 - 5.1 mmol/L   Chloride 108 98 - 111 mmol/L   CO2 21 (L) 22 - 32 mmol/L   Glucose, Bld 95 70 - 99 mg/dL   BUN 10 6 - 20 mg/dL   Creatinine, Ser 0.89 0.44 - 1.00 mg/dL   Calcium 9.8 8.9 -  10.3 mg/dL   GFR, Estimated >60 >60 mL/min   Anion gap 10 5 - 15   Imaging Studies: MR Brain W Wo Contrast  Result Date: 01/31/2022 CLINICAL DATA:  Cell lung cancer, assess treatment response. EXAM: MRI HEAD WITHOUT AND WITH CONTRAST TECHNIQUE: Multiplanar, multiecho pulse sequences of the brain and surrounding structures were obtained without and with intravenous contrast. CONTRAST:  39mL GADAVIST GADOBUTROL 1 MMOL/ML IV SOLN COMPARISON:  MRI 09/18/2021 FINDINGS: Mild intermittent motion degradation. Brain: Cerebral volume appears normal for age. As before, there is moderate patchy T2 FLAIR hyperintense signal abnormality within the pons, nonspecific but compatible with chronic small vessel ischemic disease. Minimal chronic small-vessel ischemic changes are also present within the cerebral white matter. No cortical encephalomalacia is identified. There is no acute infarct. No evidence of an intracranial mass. No chronic intracranial blood products. No extra-axial fluid collection. No midline shift. No pathologic intracranial enhancement identified. Vascular: Maintained flow voids within the proximal large arterial vessels. Skull and upper cervical spine: No focal suspicious marrow lesion. Sinuses/Orbits: Visualized orbits show no acute finding. Susceptibility artifact partially obscures the paranasal sinuses. No appreciable significant inflammatory paranasal sinus disease. IMPRESSION: Mildly motion degraded exam. No evidence of intracranial metastatic disease. Stable chronic small vessel ischemic changes which are moderate in the pons and minimal in the cerebral white matter. Electronically Signed   By: Kellie Simmering D.O.   On: 01/31/2022 07:48    ED COURSE and MDM  Nursing notes, initial and subsequent vitals signs, including pulse oximetry, reviewed and interpreted by myself.  Vitals:   02/02/22 0523 02/02/22 0600 02/02/22 0630  BP: 107/79 102/85 (!) 144/117  Pulse: (!) 133 (!) 115 (!) 105  Resp: (!)  22 (!) 21 18  Temp: 98.1 F (36.7 C)    TempSrc: Oral    SpO2: 98% 98% 100%  Weight: 63.5 kg    Height: 5\' 8"  (1.727 m)     Medications  ipratropium-albuterol (DUONEB) 0.5-2.5 (3) MG/3ML nebulizer solution 3 mL (3 mLs Nebulization Given 02/02/22 0625)  ondansetron (ZOFRAN) injection 4  mg (4 mg Intravenous Given 02/02/22 0624)  pantoprazole (PROTONIX) injection 40 mg (40 mg Intravenous Given 02/02/22 0624)  fentaNYL (SUBLIMAZE) injection 100 mcg (100 mcg Intravenous Given 02/02/22 0624)  lactated ringers bolus 2,000 mL (2,000 mLs Intravenous New Bag/Given 02/02/22 0625)   6:45 AM Patient appears clinically dehydrated.  2 L LR bolus initiated.  The patient's presentation is consistent with an esophageal obstruction likely due to the stricture seen on previous endoscopy.  Because of the patient's anticoagulated status the stricture was not dilated at that time.  She continues to be on Eliquis but did miss yesterday's dose.  We will have her admitted for continued hydration and anticipate upper endoscopy and dilation of stricture once Eliquis has left her system.  7:08 AM Dr. Olevia Bowens to admit to hospitalist service.  He will consult gastroenterology.  PROCEDURES  Procedures   ED DIAGNOSES     ICD-10-CM   1. Esophageal obstruction  K22.2     2. 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 Dehydration  E86.0     4. Bronchospasm  J98.01     5. Hypokalemia  E87.6          Jasma Seevers, [name], MD 02/02/22 [phone]

## 2022-02-02 NOTE — ED Triage Notes (Signed)
Patient presents difficulty swallow, hx of same.  ?

## 2022-02-02 NOTE — H&P (View-Only) (Signed)
?History and Physical  ? ? ?Patient: [name] SHF:[id number] DOB: 16-Mar-1971 ?DOA: 02/02/2022 ?DOS: the patient was seen and examined on 02/02/2022 ?PCP: [name], PA-C  ?Patient coming from: Home ? ?Chief Complaint:  ?Chief Complaint  ?Patient presents with  ? Esophagitis  ? ?HPI: [name] is a 51 y.o. 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  She also has a frontal headache that she believes is due to volume depletion.  She denied fever, chills, night sweats but feels tired.  No hemoptysis, PND or orthopnea.  She gets occasional palpitations, but denied dizziness, diaphoresis, chest pressure with radiation into arms, neck or back.  She has been constipated but believes this is because she has not eaten much.  No flank pain, dysuria, frequency or hematuria.  No polyuria, polydipsia, polyphagia or blurred vision.  She feels like she is dehydrated. ? ?ED course: Initial vital signs were temperature 98.1 ?F, pulse 133, respirations 22, BP 107/79 mmHg and O2 sat 98% on room air.  The patient was given a DuoNeb, 100 mcg of fentanyl, ondansetron 4 mg IVP, pantoprazole 40 mg IVP and 2000 mL of LR bolus. ? ?Labwork: Her CBC is her white count 6.9, hemoglobin 12.3 g/dL with an MCV of 101.4 fL and platelets 221.  Coronavirus and influenza PCR  negative.  LFTs were normal.  BMP showed a potassium of 3.2 and CO2 of 21 mmol/L, the rest of the measurements were unremarkable.  Magnesium was 1.7 and phosphorus 3.0 mg/dL. ? ?Imaging: A 2 view chest radiograph showed emphysema and treated right lung cancer.  There was no acute finding. ? ?Review of Systems: As mentioned in the history of present illness. All other systems reviewed and are negative. ?Past Medical History:  ?Diagnosis Date  ? Ankle sprain   ? left  ? Anxiety   ? treated for panic attacks in the past.  ? Asthma   ? COPD (chronic obstructive pulmonary disease) ([place])   ? Depression   ? History of radiation therapy   ? Right lung- 09/26/21-11/13/21- Dr. Gery Pray  ? Irregular heart rate   ? Lung cancer ([place])   ? Renal disorder   ? kidney infections  ? ?Past Surgical History:  ?Procedure Laterality Date  ? APPENDECTOMY    ? BRONCHIAL BIOPSY  09/21/2021  ? Procedure: BRONCHIAL BIOPSIES;  Surgeon: Collene Gobble, MD;  Location: [facility] ENDOSCOPY;  Service: Pulmonary;;  ? BRONCHIAL BRUSHINGS  09/21/2021  ? Procedure: BRONCHIAL BRUSHINGS;  Surgeon: Collene Gobble, MD;  Location: [facility] ENDOSCOPY;  Service: Pulmonary;;  ? BRONCHIAL NEEDLE ASPIRATION BIOPSY  09/21/2021  ? Procedure: BRONCHIAL NEEDLE ASPIRATION BIOPSIES;  Surgeon: Collene Gobble, MD;  Location: [facility] ENDOSCOPY;  Service: Pulmonary;;  ? ESOPHAGOGASTRODUODENOSCOPY N/A 12/26/2021  ? Procedure: ESOPHAGOGASTRODUODENOSCOPY (EGD);  Surgeon: Clarene Essex, MD;  Location: [facility] ENDOSCOPY;  Service: Endoscopy;  Laterality: N/A;  ? ESOPHAGOGASTRODUODENOSCOPY (EGD) WITH PROPOFOL N/A 01/07/2022  ? Procedure: ESOPHAGOGASTRODUODENOSCOPY (  EGD) WITH PROPOFOL;  Surgeon: Otis Brace, MD;  Location: [facility] ENDOSCOPY;  Service: Gastroenterology;  Laterality: N/A;  ? FOREIGN BODY REMOVAL  12/26/2021  ? Procedure: FOREIGN BODY REMOVAL;  Surgeon: Clarene Essex, MD;  Location: [facility] ENDOSCOPY;  Service: Endoscopy;;  ? IR CHEST FLUORO  12/27/2021  ? LUMBAR LAMINECTOMY N/A 05/13/2018  ?  Procedure: LEFT LUMBAR FOUR-FIVE MICRODISCECTOMY;  Surgeon: Marybelle Killings, MD;  Location: [place];  Service: Orthopedics;  Laterality: N/A;  ? TUBAL LIGATION    ? TUBAL LIGATION  1998  ? VIDEO BRONCHOSCOPY WITH ENDOBRONCHIAL ULTRASOUND N/A 09/21/2021  ? Procedure: VIDEO BRONCHOSCOPY WITH ENDOBRONCHIAL ULTRASOUND;  Surgeon: Collene Gobble, MD;  Location[facility] ENDOSCOPY;  Service: Pulmonary;  Laterality: N/A;  ? ?Social History:  reports that she has been smoking cigarettes. She has a 15.00 pack-year smoking history. She has never used smokeless tobacco. She reports that she does not currently use alcohol. She reports that she does not currently use drugs after having used the following drugs: Marijuana. ? ?Allergies  ?Allergen Reactions  ? Naproxen Nausea Only  ? Bactrim [Sulfamethoxazole-Trimethoprim] Nausea Only  ? ? ?Family History  ?Problem Relation Age of Onset  ? Hypertension Mother   ? Hypertension Father   ? Heart disease Father   ? ? ?Prior to Admission medications   ?Medication Sig Start Date End Date Taking? Authorizing Provider  ?albuterol (PROVENTIL HFA;VENTOLIN HFA) 108 (90 Base) MCG/ACT inhaler Inhale 2 puffs into the lungs every 6 (six) hours as needed for wheezing or shortness of breath. 03/13/17   Alfonse Spruce, FNP  ?APIXABAN (ELIQUIS) VTE STARTER PACK (10MG  AND 5MG ) Take as directed on package: start with two-5mg  tablets twice daily for 7 days. On day 8, switch to one-5mg  tablet twice daily. 12/27/21   Barb Merino, MD  ?diphenhydramine-acetaminophen (TYLENOL PM) 25-500 MG TABS tablet Take 3 tablets by mouth at bedtime as needed (sleep).    [provider]  ?HYDROmorphone (DILAUDID) 2 MG tablet Take 1 tablet (2 mg total) by mouth every 4 (four) hours as needed for severe pain. ?Patient not taking: Reported on 01/24/2022 12/27/21   Barb Merino, MD  ?nicotine (NICODERM CQ - DOSED IN MG/24 HOURS) 21 mg/24hr patch Place 1 patch (21 mg total) onto the skin daily. ?Patient not taking:  Reported on 01/07/2022 12/28/21   Barb Merino, MD  ?ondansetron (ZOFRAN ODT) 8 MG disintegrating tablet Dissolve 1 tablet (8 mg total) by mouth every 8 (eight) hours as needed for nausea or vomiting. ?Patient not taking: Reported on 01/24/2022 10/11/21   Heilingoetter, Cassandra L, PA-C  ?oxyCODONE-acetaminophen (PERCOCET/ROXICET) 5-325 MG tablet Take 1 tablet by mouth every 6 hours as needed for severe pain. 01/24/22   Gery Pray, MD  ?pantoprazole (PROTONIX) 40 MG tablet Take 1 tablet (40 mg total) by mouth 2 (two) times daily. ?Patient not taking: Reported on 01/24/2022 12/27/21 01/26/22  Barb Merino, MD  ?sucralfate (CARAFATE) 1 GM/10ML suspension Take 10 mLs (1 g total) by mouth 4 (four) times daily -  with meals and at bedtime. 01/24/22   Gery Pray, MD  ? ? ?Physical Exam: ?Vitals:  ? 02/02/22 0523 02/02/22 0600 02/02/22 0630 02/02/22 0813  ?BP: 107/79 102/85 (!) 144/117 100/72  ?Pulse: (!) 133 (!) 115 (!) 105 (!) 102  ?Resp: (!) 22 (!) 21 18 20   ?Temp: 98.1 ?F (36.7 ?C)   97.7 ?F (36.5 ?C)  ?TempSrc: Oral   Oral  ?SpO2: 98% 98% 100% 100%  ?Weight: 63.5 kg     ?  Height: 5\' 8"  (1.727 m)     ? ?Physical Exam ?Vitals and nursing note reviewed.  ?Constitutional:   ?   Appearance: She is ill-appearing.  ?HENT:  ?   Head: Normocephalic.  ?   Mouth/Throat:  ?   Mouth: Mucous membranes are moist.  ?   Comments: Lips are dry. ?Eyes:  ?   General: No scleral icterus. ?   Pupils: Pupils are equal, round, and reactive to light.  ?Neck:  ?   Vascular: No JVD.  ?Cardiovascular:  ?   Rate and Rhythm: Normal rate and regular rhythm.  ?   Heart sounds: S1 normal and S2 normal.  ?Pulmonary:  ?   Effort: Pulmonary effort is normal. No accessory muscle usage.  ?   Breath sounds: Examination of the right-upper field reveals rhonchi. Examination of the left-upper field reveals rhonchi. Rhonchi present.  ?Abdominal:  ?   General: Bowel sounds are normal. There is no distension.  ?   Palpations: Abdomen is soft.  ?   Tenderness: There  is no abdominal tenderness.  ?Musculoskeletal:  ?   Cervical back: Neck supple.  ?   Right lower leg: No edema.  ?   Left lower leg: No edema.  ?Skin: ?   General: Skin is warm and dry.  ?   Comments: Decreased skin tur

## 2022-02-02 NOTE — Consult Note (Signed)
[facility] Consult  Referring Provider: [facility] hospitalist/Dr. Olevia Bowens Primary Care Physician:  [name], PA-C Primary Gastroenterologist: Reita Chard seen by Howie Ill in January and February, has a follow-up appointment with [facility] on 02/20/2022)  Reason for Consultation: Dysphagia  HPI: [name] is a 51 y.o. female states that she was in her usual state of health until yesterday morning when she had some mashed potatoes,small amount of peas and tried eating a small amount of ribs and since then has not been able to eat or drink without regurgitating fluid or food back.  Since her last endoscopy, patient was on liquids for few weeks and slowly increased her diet to full liquids and eventually to eating foods like popcorn shrimp.  She states she has been taking Carafate however does not recall being on a PPI. She denies vomiting blood. She complains of acid reflux and heartburn. She has been having regular bowel movements. Denies blood in stool or black stools, denies abdominal or rectal pain. She states she took Eliquis yesterday morning but vomited the pill out.  History of small cell lung cancer, right hilar/suprahilar mass, occlusion of right upper lobe bronchus, on chemotherapy and radiation Intermittent dysphagia since October 2022   EGD, 12/26/2021, dysphagia abnormal UGI series: Food noted, mild esophageal stenosis, food bolus removed EGD 01/07/2022, dysphagia: LA grade D esophagitis, esophageal stenosis 5 cm in length, adult scope passed, dilation not performed, advised to stay on pured diet Modified barium swallow study, 12/24/2021: Mild pharyngeal phase dysphagia, severe esophageal dysphagia  Past Medical History:  Diagnosis Date   Ankle sprain    left   Anxiety    treated for panic attacks in the past.   Asthma    COPD (chronic obstructive pulmonary disease) ([place])    Depression    History of radiation therapy    Right lung-  09/26/21-11/13/21- Dr. Gery Pray   Irregular heart rate    Lung cancer [facility])    Renal disorder    kidney infections    Past Surgical History:  Procedure Laterality Date   APPENDECTOMY     BRONCHIAL BIOPSY  09/21/2021   Procedure: BRONCHIAL BIOPSIES;  Surgeon: Collene Gobble, MD;  Location: [place];  Service: Pulmonary;;   BRONCHIAL BRUSHINGS  09/21/2021   Procedure: BRONCHIAL BRUSHINGS;  Surgeon: Collene Gobble, MD;  Location: [place];  Service: Pulmonary;;   BRONCHIAL NEEDLE ASPIRATION BIOPSY  09/21/2021   Procedure: BRONCHIAL NEEDLE ASPIRATION BIOPSIES;  Surgeon: Collene Gobble, MD;  Location: [facility];  Service: Pulmonary;;   ESOPHAGOGASTRODUODENOSCOPY N/A 12/26/2021   Procedure: ESOPHAGOGASTRODUODENOSCOPY (EGD);  Surgeon: Clarene Essex, MD;  Location: [facility];  Service: Endoscopy;  Laterality: N/A;   ESOPHAGOGASTRODUODENOSCOPY (EGD) WITH PROPOFOL N/A 01/07/2022   Procedure: ESOPHAGOGASTRODUODENOSCOPY (EGD) WITH PROPOFOL;  Surgeon: Otis Brace, MD;  Location: [facility];  Service: Gastroenterology;  Laterality: N/A;   FOREIGN BODY REMOVAL  12/26/2021   Procedure: FOREIGN BODY REMOVAL;  Surgeon: Clarene Essex, MD;  Location: [facility];  Service: Endoscopy;;   IR CHEST FLUORO  12/27/2021   LUMBAR LAMINECTOMY N/A 05/13/2018   Procedure: LEFT LUMBAR FOUR-FIVE MICRODISCECTOMY;  Surgeon: Marybelle Killings, MD;  Location: [place];  Service: Orthopedics;  Laterality: N/A;   TUBAL LIGATION     TUBAL LIGATION  1998   VIDEO BRONCHOSCOPY WITH ENDOBRONCHIAL ULTRASOUND N/A 09/21/2021   Procedure: VIDEO BRONCHOSCOPY WITH ENDOBRONCHIAL ULTRASOUND;  Surgeon: Collene Gobble, MD;  Location: [place] [facility];  Service: Pulmonary;  Laterality: N/A;  Prior to Admission medications   Medication Sig Start Date End Date Taking? Authorizing Provider  albuterol (PROVENTIL HFA;VENTOLIN HFA) 108 (90 Base) MCG/ACT inhaler Inhale 2 puffs into the lungs every 6 (six) hours as needed for wheezing  or shortness of breath. 03/13/17   Alfonse Spruce, FNP  APIXABAN [facility]) VTE STARTER PACK (10MG  AND 5MG ) Take as directed on package: start with two-5mg  tablets twice daily for 7 days. On day 8, switch to one-5mg  tablet twice daily. 12/27/21   Barb Merino, MD  diphenhydramine-acetaminophen (TYLENOL PM) 25-500 MG TABS tablet Take 3 tablets by mouth at bedtime as needed (sleep).    [provider]  HYDROmorphone (DILAUDID) 2 MG tablet Take 1 tablet (2 mg total) by mouth every 4 (four) hours as needed for severe pain. Patient not taking: Reported on 01/24/2022 12/27/21   Barb Merino, MD  nicotine (NICODERM CQ - DOSED IN MG/24 HOURS) 21 mg/24hr patch Place 1 patch (21 mg total) onto the skin daily. Patient not taking: Reported on 01/07/2022 12/28/21   Barb Merino, MD  ondansetron (ZOFRAN ODT) 8 MG disintegrating tablet Dissolve 1 tablet (8 mg total) by mouth every 8 (eight) hours as needed for nausea or vomiting. Patient not taking: Reported on 01/24/2022 10/11/21   Heilingoetter, Cassandra L, PA-C  oxyCODONE-acetaminophen (PERCOCET/ROXICET) 5-325 MG tablet Take 1 tablet by mouth every 6 hours as needed for severe pain. 01/24/22   Gery Pray, MD  pantoprazole (PROTONIX) 40 MG tablet Take 1 tablet (40 mg total) by mouth 2 (two) times daily. Patient not taking: Reported on 01/24/2022 12/27/21 01/26/22  Barb Merino, MD  sucralfate (CARAFATE) 1 GM/10ML suspension Take 10 mLs (1 g total) by mouth 4 (four) times daily -  with meals and at bedtime. 01/24/22   Gery Pray, MD    Current Facility-Administered Medications  Medication Dose Route Frequency Provider Last Rate Last Admin   0.9 % NaCl with KCl 20 mEq/ L  infusion   Intravenous Continuous Reubin Milan, MD       glycopyrrolate (ROBINUL) injection 0.1 mg  0.1 mg Intravenous Q4H PRN Reubin Milan, MD       HYDROmorphone (DILAUDID) injection 1 mg  1 mg Intravenous Q3H PRN Reubin Milan, MD   1 mg at 02/02/22 0914    ipratropium-albuterol (DUONEB) 0.5-2.5 (3) MG/3ML nebulizer solution 3 mL  3 mL Nebulization Q4H Reubin Milan, MD   3 mL at 02/02/22 3845   nicotine (NICODERM CQ - dosed in mg/24 hours) patch 14 mg  14 mg Transdermal Daily Reubin Milan, MD       ondansetron [facility]) injection 4 mg  4 mg Intravenous Q6H PRN Reubin Milan, MD       [START ON 02/03/2022] pantoprazole (PROTONIX) injection 40 mg  40 mg Intravenous Q24H Reubin Milan, MD       potassium chloride 10 mEq in 100 mL IVPB  10 mEq Intravenous Q1 Hr x 3 Reubin Milan, MD        Allergies as of 02/02/2022 - Review Complete 02/02/2022  Allergen Reaction Noted   Naproxen Nausea Only 09/10/2021   Bactrim [sulfamethoxazole-trimethoprim] Nausea Only 04/06/2012    Family History  Problem Relation Age of Onset   Hypertension Mother    Hypertension Father    Heart disease Father     Social History   Socioeconomic History   Marital status: Divorced    Spouse name: Not on file   Number of children: Not on  file   Years of education: Not on file   Highest education level: Not on file  Occupational History   Not on file  Tobacco Use   Smoking status: Every Day    Packs/day: 0.50    Years: 30.00    Pack years: 15.00    Types: Cigarettes   Smokeless tobacco: Never  Vaping Use   Vaping Use: Some days   Substances: Flavoring  Substance and Sexual Activity   Alcohol use: Not Currently    Comment: rare   Drug use: Not Currently    Types: Marijuana    Comment: occ-   Sexual activity: Not on file  Other Topics Concern   Not on file  Social History Narrative   Not on file   Social Determinants of Health   Financial Resource Strain: Not on file  Food Insecurity: Not on file  Transportation Needs: Not on file  Physical Activity: Not on file  Stress: Not on file  Social Connections: Not on file  Intimate Partner Violence: Not on file    Review of Systems:  GI: Described in detail in HPI.     Gen: Denies any fever, chills, rigors, night sweats, anorexia, fatigue, weakness, malaise, involuntary weight loss, and sleep disorder CV: Denies chest pain, angina, palpitations, syncope, orthopnea, PND, peripheral edema, and claudication. Resp: Denies dyspnea, cough, sputum, wheezing, coughing up blood. GU : Denies urinary burning, blood in urine, urinary frequency, urinary hesitancy, nocturnal urination, and urinary incontinence. MS: Denies joint pain or swelling.  Denies muscle weakness, cramps, atrophy.  Derm: Denies rash, itching, oral ulcerations, hives, unhealing ulcers.  Psych: Denies depression, anxiety, memory loss, suicidal ideation, hallucinations,  and confusion. Heme: Denies bruising, bleeding, and enlarged lymph nodes. Neuro:  Denies any headaches, dizziness, paresthesias. Endo:  Denies any problems with DM, thyroid, adrenal function.  Physical Exam: Vital signs in last 24 hours: Temp:  [97.7 F (36.5 C)-98.1 F (36.7 C)] 97.7 F (36.5 C) (03/11 0813) Pulse Rate:  [102-133] 102 (03/11 0813) Resp:  [18-22] 20 (03/11 0813) BP: (100-144)/(72-117) 100/72 (03/11 0813) SpO2:  [98 %-100 %] 100 % (03/11 0813) Weight:  [63.5 kg] 63.5 kg (03/11 0523)    General:   Has an emesis bag at bedside, burping and belching Head:  Normocephalic and atraumatic. Eyes:  Sclera clear, no icterus.   Conjunctiva pink. Ears:  Normal auditory acuity. Nose:  No deformity, discharge,  or lesions. Mouth:  No deformity or lesions.  Oropharynx pink & moist. Neck:  Supple; no masses or thyromegaly. Lungs:  Clear throughout to auscultation.   No wheezes, crackles, or rhonchi. No acute distress. Heart:  Regular rate and rhythm; no murmurs, clicks, rubs,  or gallops. Extremities:  Without clubbing or edema. Neurologic:  Alert and  oriented x4;  grossly normal neurologically. Skin:  Intact without significant lesions or rashes. Psych:  Alert and cooperative. Normal mood and affect. Abdomen:  Soft,  nontender and nondistended. No masses, hepatosplenomegaly or hernias noted. Normal bowel sounds, without guarding, and without rebound.         Lab Results: Recent Labs    02/02/22 0555  WBC 6.9  HGB 12.3  HCT 36.4  PLT 221   BMET Recent Labs    02/02/22 0555  NA 139  K 3.2*  CL 108  CO2 21*  GLUCOSE 95  BUN 10  CREATININE 0.89  CALCIUM 9.8   LFT No results for input(s): PROT, ALBUMIN, AST, ALT, ALKPHOS, BILITOT, BILIDIR, IBILI in the last 72  hours. PT/INR No results for input(s): LABPROT, INR in the last 72 hours.  Studies/Results: DG Chest 2 View  Result Date: 02/02/2022 CLINICAL DATA:  Bronchi. Trouble swallowing. History of lung cancer EXAM: CHEST - 2 VIEW COMPARISON:  01/07/2022 FINDINGS: Stable right suprahilar nodule, presumed site of right lung radiotherapy. Generalized interstitial coarsening from emphysema. Normal heart size and mediastinal contours. There is no edema, consolidation, effusion, or pneumothorax. IMPRESSION: 1. No acute finding. 2. Emphysema and treated right lung cancer. Electronically Signed   By: Jorje Guild M.D.   On: 02/02/2022 07:17    Impression: Dysphagia Possible food bolus impaction since yesterday morning History of esophageal stricture History of severe esophagitis  Lung cancer, history of chemotherapy and radiation On Eliquis, last dose yesterday morning Received Lovenox 60 mg subcu at 10 AM today morning  Mild hypokalemia, mild acidosis  Plan: EGD now Patient's last dose of Eliquis was yesterday morning and received Lovenox at 10 AM today morning. Discussed about increased risk of bleeding, increased risk of perforation. She understands and verbalizes consent. Will increase pantoprazole to 40 mg every 12 hours. Post EGD hopefully patient can tolerate Carafate 1 g 4 times a day.   LOS: 0 days   Ronnette Juniper, MD  02/02/2022, 9:26 AM

## 2022-02-02 NOTE — Progress Notes (Signed)
ANTICOAGULATION CONSULT NOTE - Initial Consult ? ?Pharmacy Consult for Lovenox  ?Indication: VTE treatment (Apixaban on hold) ? ?Allergies  ?Allergen Reactions  ? Naproxen Nausea Only  ? Bactrim [Sulfamethoxazole-Trimethoprim] Nausea Only  ? ? ?Patient Measurements: ?Height: 5\' 8"  (172.7 cm) ?Weight: 63.5 kg (140 lb) ?IBW/kg (Calculated) : 63.9 ?Heparin Dosing Weight:  ? ?Vital Signs: ?Temp: 98.1 ?F (36.7 ?C) (03/11 2505) ?Temp Source: Oral (03/11 3976) ?BP: 144/117 (03/11 0630) ?Pulse Rate: 105 (03/11 0630) ? ?Labs: ?Recent Labs  ?  02/02/22 ?7341  ?HGB 12.3  ?HCT 36.4  ?PLT 221  ?CREATININE 0.89  ? ? ?Estimated Creatinine Clearance: 75.8 mL/min (by C-G formula based on SCr of 0.89 mg/dL). ? ? ?Medical History: ?Past Medical History:  ?Diagnosis Date  ? Ankle sprain   ? left  ? Anxiety   ? treated for panic attacks in the past.  ? Asthma   ? COPD (chronic obstructive pulmonary disease) ([place])   ? Depression   ? History of radiation therapy   ? Right lung- 09/26/21-11/13/21- Dr. Gery Pray  ? Irregular heart rate   ? Lung cancer ([place])   ? Renal disorder   ? kidney infections  ? ? ?Medications:  ?(Not in a hospital admission)  ?Scheduled:  ? ipratropium-albuterol  3 mL Nebulization Q4H  ? [START ON 02/03/2022] pantoprazole (PROTONIX) IV  40 mg Intravenous Q24H  ? ?Infusions:  ? 0.9 % NaCl with KCl 20 mEq / L    ? potassium chloride    ? ? ?Assessment: ?52 yoF admitted on 3/11 with Esophagitis related to lung cancer treatment radiation-induced mild esophageal stricture and esophagitis.  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  Pharmacy is consulted to dose Lovenox while she is unable to take PO medications.   ? ?Last dose of PO apixaban was taken prior to admission on 3/10 AM, but patient thinks she may have spit up the dose.  She reports being unable to tolerate any PO meds. ?Baseline CBC: Hgb and Plt WNL ?SCr 0.89, CrCl > 30 ml/min ? ? ?Goal of Therapy:  ?Anti-Xa level 0.6-1  units/ml 4hrs after LMWH dose given ?Monitor platelets by anticoagulation protocol: Yes ?  ?Plan:  ?Lovenox 1 mg/kg (60mg ) SQ q12h ?Follow up CBC, SCr, PO intake  ? ?Gretta Arab PharmD, BCPS ?Clinical Pharmacist ?[facility] [phone] ?02/02/2022 8:02 AM ? ? ?

## 2022-02-03 DIAGNOSIS — T66XXXA Radiation sickness, unspecified, initial encounter: Secondary | ICD-10-CM

## 2022-02-03 DIAGNOSIS — D696 Thrombocytopenia, unspecified: Secondary | ICD-10-CM | POA: Diagnosis not present

## 2022-02-03 DIAGNOSIS — K208 Other esophagitis without bleeding: Secondary | ICD-10-CM

## 2022-02-03 DIAGNOSIS — R42 Dizziness and giddiness: Secondary | ICD-10-CM | POA: Diagnosis not present

## 2022-02-03 DIAGNOSIS — Y842 Radiological procedure and radiotherapy as the cause of abnormal reaction of the patient, or of later complication, without mention of misadventure at the time of the procedure: Secondary | ICD-10-CM | POA: Diagnosis present

## 2022-02-03 DIAGNOSIS — Z9221 Personal history of antineoplastic chemotherapy: Secondary | ICD-10-CM | POA: Diagnosis not present

## 2022-02-03 DIAGNOSIS — I82621 Acute embolism and thrombosis of deep veins of right upper extremity: Secondary | ICD-10-CM

## 2022-02-03 DIAGNOSIS — E876 Hypokalemia: Secondary | ICD-10-CM

## 2022-02-03 DIAGNOSIS — K222 Esophageal obstruction: Secondary | ICD-10-CM | POA: Diagnosis present

## 2022-02-03 DIAGNOSIS — D7589 Other specified diseases of blood and blood-forming organs: Secondary | ICD-10-CM

## 2022-02-03 DIAGNOSIS — F17210 Nicotine dependence, cigarettes, uncomplicated: Secondary | ICD-10-CM | POA: Diagnosis present

## 2022-02-03 DIAGNOSIS — J439 Emphysema, unspecified: Secondary | ICD-10-CM | POA: Diagnosis present

## 2022-02-03 DIAGNOSIS — Z85118 Personal history of other malignant neoplasm of bronchus and lung: Secondary | ICD-10-CM | POA: Diagnosis not present

## 2022-02-03 DIAGNOSIS — E860 Dehydration: Secondary | ICD-10-CM | POA: Diagnosis present

## 2022-02-03 DIAGNOSIS — R1310 Dysphagia, unspecified: Secondary | ICD-10-CM | POA: Diagnosis not present

## 2022-02-03 DIAGNOSIS — Z20822 Contact with and (suspected) exposure to covid-19: Secondary | ICD-10-CM | POA: Diagnosis present

## 2022-02-03 DIAGNOSIS — D539 Nutritional anemia, unspecified: Secondary | ICD-10-CM | POA: Diagnosis present

## 2022-02-03 DIAGNOSIS — D638 Anemia in other chronic diseases classified elsewhere: Secondary | ICD-10-CM | POA: Diagnosis present

## 2022-02-03 DIAGNOSIS — E871 Hypo-osmolality and hyponatremia: Secondary | ICD-10-CM | POA: Diagnosis present

## 2022-02-03 DIAGNOSIS — R000 Tachycardia, unspecified: Secondary | ICD-10-CM

## 2022-02-03 DIAGNOSIS — J449 Chronic obstructive pulmonary disease, unspecified: Secondary | ICD-10-CM | POA: Diagnosis not present

## 2022-02-03 DIAGNOSIS — Z7901 Long term (current) use of anticoagulants: Secondary | ICD-10-CM | POA: Diagnosis not present

## 2022-02-03 DIAGNOSIS — Z8249 Family history of ischemic heart disease and other diseases of the circulatory system: Secondary | ICD-10-CM | POA: Diagnosis not present

## 2022-02-03 DIAGNOSIS — T18128A Food in esophagus causing other injury, initial encounter: Secondary | ICD-10-CM | POA: Diagnosis present

## 2022-02-03 DIAGNOSIS — R1314 Dysphagia, pharyngoesophageal phase: Secondary | ICD-10-CM | POA: Diagnosis present

## 2022-02-03 DIAGNOSIS — E872 Acidosis, unspecified: Secondary | ICD-10-CM | POA: Diagnosis present

## 2022-02-03 DIAGNOSIS — Z86718 Personal history of other venous thrombosis and embolism: Secondary | ICD-10-CM | POA: Diagnosis not present

## 2022-02-03 DIAGNOSIS — J9601 Acute respiratory failure with hypoxia: Secondary | ICD-10-CM | POA: Diagnosis present

## 2022-02-03 DIAGNOSIS — R1319 Other dysphagia: Secondary | ICD-10-CM | POA: Diagnosis not present

## 2022-02-03 LAB — CBC
HCT: 28.8 % — ABNORMAL LOW (ref 36.0–46.0)
Hemoglobin: 9.6 g/dL — ABNORMAL LOW (ref 12.0–15.0)
MCH: 34.4 pg — ABNORMAL HIGH (ref 26.0–34.0)
MCHC: 33.3 g/dL (ref 30.0–36.0)
MCV: 103.2 fL — ABNORMAL HIGH (ref 80.0–100.0)
Platelets: 169 10*3/uL (ref 150–400)
RBC: 2.79 MIL/uL — ABNORMAL LOW (ref 3.87–5.11)
RDW: 19.6 % — ABNORMAL HIGH (ref 11.5–15.5)
WBC: 10.5 10*3/uL (ref 4.0–10.5)
nRBC: 0 % (ref 0.0–0.2)

## 2022-02-03 LAB — BASIC METABOLIC PANEL
Anion gap: 7 (ref 5–15)
BUN: 8 mg/dL (ref 6–20)
CO2: 20 mmol/L — ABNORMAL LOW (ref 22–32)
Calcium: 9.1 mg/dL (ref 8.9–10.3)
Chloride: 108 mmol/L (ref 98–111)
Creatinine, Ser: 0.81 mg/dL (ref 0.44–1.00)
GFR, Estimated: >60 mL/min (ref >60)
Glucose, Bld: 117 mg/dL — ABNORMAL HIGH (ref 70–99)
Potassium: 4.1 mmol/L (ref 3.5–5.1)
Sodium: 135 mmol/L (ref 135–145)

## 2022-02-03 LAB — VITAMIN B12: Vitamin B-12: 448 pg/mL (ref 180–914)

## 2022-02-03 MED ORDER — ENOXAPARIN SODIUM 60 MG/0.6ML IJ SOSY
60.0000 mg | PREFILLED_SYRINGE | Freq: Two times a day (BID) | INTRAMUSCULAR | Status: DC
  Administered 2022-02-04 – 2022-02-05 (×2): 60 mg via SUBCUTANEOUS
  Filled 2022-02-03 (×3): qty 0.6

## 2022-02-03 MED ORDER — SODIUM CHLORIDE 0.9 % IV SOLN: Freq: Once | INTRAVENOUS | Status: DC

## 2022-02-03 MED ORDER — METHOCARBAMOL 1000 MG/10ML IJ SOLN
500.0000 mg | Freq: Four times a day (QID) | INTRAVENOUS | Status: DC | PRN
  Administered 2022-02-03 – 2022-02-05 (×7): 500 mg via INTRAVENOUS
  Filled 2022-02-03 (×4): qty 500
  Filled 2022-02-03: qty 5
  Filled 2022-02-03 (×2): qty 500
  Filled 2022-02-03: qty 5
  Filled 2022-02-03: qty 500

## 2022-02-03 MED ORDER — SODIUM CHLORIDE 0.9 % IV SOLN
INTRAVENOUS | Status: DC
  Administered 2022-02-03: 1000 mL via INTRAVENOUS

## 2022-02-03 MED ORDER — IPRATROPIUM-ALBUTEROL 0.5-2.5 (3) MG/3ML IN SOLN
3.0000 mL | Freq: Two times a day (BID) | RESPIRATORY_TRACT | Status: DC
Start: 2022-02-03 — End: 2022-02-07
  Administered 2022-02-03 – 2022-02-06 (×7): 3 mL via RESPIRATORY_TRACT
  Filled 2022-02-03 (×7): qty 3

## 2022-02-03 MED ORDER — ENOXAPARIN SODIUM 60 MG/0.6ML IJ SOSY
60.0000 mg | PREFILLED_SYRINGE | Freq: Two times a day (BID) | INTRAMUSCULAR | Status: AC
  Administered 2022-02-03 (×2): 60 mg via SUBCUTANEOUS
  Filled 2022-02-03 (×3): qty 0.6

## 2022-02-03 MED ORDER — SODIUM CHLORIDE 0.9 % IV SOLN: INTRAVENOUS | Status: DC

## 2022-02-03 NOTE — Progress Notes (Signed)
[name] and I walked the whole length of the hall and back to her room, tolerated well. Heart rate went up to 150"s and she was a little SOB. She states she always gets sob when ambulating. I asked her if she was a smoker and she states yes, but she states she has cut back a lot. Will give her information on quitting smoking. ?

## 2022-02-03 NOTE — Progress Notes (Signed)
?   02/03/22 1707  ?Vitals  ?Temp 98.3 ?F (36.8 ?C)  ?Temp Source Oral  ?BP 119/89  ?MAP (mmHg) 100  ?BP Location Left Arm  ?BP Method Automatic  ?Patient Position (if appropriate) Lying  ?Pulse Rate (!) 118  ?Pulse Rate Source Monitor  ?Resp 16  ?MEWS COLOR  ?MEWS Score Color Yellow  ?Oxygen Therapy  ?SpO2 96 %  ?MEWS Score  ?MEWS Temp 0  ?MEWS Systolic 0  ?MEWS Pulse 2  ?MEWS RR 0  ?MEWS LOC 0  ?MEWS Score 2  ? ? ?

## 2022-02-03 NOTE — Progress Notes (Signed)
PROGRESS NOTE    [name]  KDX:[id number] DOB: 1971/01/31 DOA: 02/02/2022 PCP: [name], PA-C   Brief Narrative:  51 y.o. 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  GI was consulted.  She underwent EGD on 02/02/2022.  Assessment & Plan:   Dysphagia Esophageal stricture Radiation-induced esophagitis -Status post EGD on 02/02/2022.  GI planning for EGD with balloon dilatation possibly tomorrow morning. -Continue PPI and sucralfate. -Continue current diet.  Hypokalemia -Resolved  Anemia of chronic disease Macrocytosis -Possibly from cancer and chemotherapy.  B12 level 448  DVT of upper extremity -Eliquis on hold.  Lovenox per pharmacy.  Hold a.m. dose of Lovenox  History of lung cancer with radiation therapy -Outpatient follow-up with oncology  Sinus tachycardia -Possibly from dehydration.  Continue IV fluids    DVT prophylaxis: Lovenox Code Status: Full Family Communication:  Disposition Plan: Status is: Observation The patient will require care spanning > 2 midnights and should be moved to inpatient because: Of need for repeat GI procedure tomorrow    Consultants: GI  Procedures: EGD on 02/02/2022  Antimicrobials: None   Subjective: Patient seen and examined at bedside.  Able to tolerate clear liquids.  No fever, vomiting, worsening shortness of breath reported.  Objective: Vitals:   02/02/22 2010 02/02/22 2051 02/03/22 0510 02/03/22 0759  BP:  122/80 106/81   Pulse:  (!) 109 (!) 109   Resp:  18 18   Temp:  (!) 97.5 F (36.4 C) 97.7 F (36.5 C)   TempSrc:  Oral Oral   SpO2: 97% 94% 90% 99%  Weight:   68.1 kg   Height:        Intake/Output Summary (Last 24 hours) at 02/03/2022 0945 Last data filed  at 02/03/2022 0316 Gross per 24 hour  Intake 1053.32 ml  Output 201 ml  Net 852.32 ml   Filed Weights   02/02/22 0523 02/02/22 1107 02/03/22 0510  Weight: 63.5 kg 63.5 kg 68.1 kg    Examination:  General exam: Appears calm and comfortable.  Currently on room air. Respiratory system: Bilateral decreased breath sounds at bases Cardiovascular system: S1 & S2 heard, intermittently tachycardic  gastrointestinal system: Abdomen is nondistended, soft and nontender. Normal bowel sounds heard. Extremities: No cyanosis, clubbing, edema  Central nervous system: Alert and oriented. No focal neurological deficits. Moving extremities Skin: No rashes, lesions or ulcers Psychiatry: Judgement and insight appear normal. Mood & affect appropriate.     Data Reviewed: I have personally reviewed following labs and imaging studies  CBC: Recent Labs  Lab 02/02/22 0555 02/03/22 0604  WBC 6.9 10.5  NEUTROABS 4.8  --   HGB 12.3 9.6*  HCT 36.4 28.8*  MCV 101.4* 103.2*  PLT 221 976   Basic Metabolic Panel: Recent Labs  Lab 02/02/22 0555 02/03/22 0604  NA 139 135  K 3.2* 4.1  CL 108 108  CO2 21* 20*  GLUCOSE 95 117*  BUN 10 8  CREATININE 0.89 0.81  CALCIUM 9.8 9.1  MG 1.7  --   PHOS 3.0  --    GFR: Estimated Creatinine Clearance: 83.8 mL/min (by C-G formula based on SCr of 0.81 mg/dL). Liver Function Tests: Recent Labs  Lab 02/02/22 0555  AST 16  ALT 13  ALKPHOS 77  BILITOT 0.5  PROT 6.8  ALBUMIN 3.5  No results for input(s): LIPASE, AMYLASE in the last 168 hours. No results for input(s): AMMONIA in the last 168 hours. Coagulation Profile: No results for input(s): INR, PROTIME in the last 168 hours. Cardiac Enzymes: No results for input(s): CKTOTAL, CKMB, CKMBINDEX, TROPONINI in the last 168 hours. BNP (last 3 results) No results for input(s): PROBNP in the last 8760 hours. HbA1C: No results for input(s): HGBA1C in the last 72 hours. CBG: No results for input(s):  GLUCAP in the last 168 hours. Lipid Profile: No results for input(s): CHOL, HDL, LDLCALC, TRIG, CHOLHDL, LDLDIRECT in the last 72 hours. Thyroid Function Tests: No results for input(s): TSH, T4TOTAL, FREET4, T3FREE, THYROIDAB in the last 72 hours. Anemia Panel: Recent Labs    02/03/22 0604  VITAMINB12 448   Sepsis Labs: No results for input(s): PROCALCITON, LATICACIDVEN in the last 168 hours.  Recent Results (from the past 240 hour(s))  Resp Panel by RT-PCR (Flu A&B, Covid) Nasopharyngeal Swab     Status: None   Collection Time: 02/02/22  6:13 AM   Specimen: Nasopharyngeal Swab; Nasopharyngeal(NP) swabs in vial transport medium  Result Value Ref Range Status   SARS Coronavirus 2 by RT PCR NEGATIVE NEGATIVE Final    Comment: (NOTE) SARS-CoV-2 target nucleic acids are NOT DETECTED.  The SARS-CoV-2 RNA is generally detectable in upper respiratory specimens during the acute phase of infection. The lowest concentration of SARS-CoV-2 viral copies this assay can detect is 138 copies/mL. A negative result does not preclude SARS-Cov-2 infection and should not be used as the sole basis for treatment or other patient management decisions. A negative result may occur with  improper specimen collection/handling, submission of specimen other than nasopharyngeal swab, presence of viral mutation(s) within the areas targeted by this assay, and inadequate number of viral copies(<138 copies/mL). A negative result must be combined with clinical observations, patient history, and epidemiological information. The expected result is Negative.  Fact Sheet for Patients:  EntrepreneurPulse.com.au  Fact Sheet for Healthcare Providers:  IncredibleEmployment.be  This test is no t yet approved or cleared by the [place] FDA and  has been authorized for detection and/or diagnosis of SARS-CoV-2 by FDA under an Emergency Use Authorization (EUA). This EUA will remain   in effect (meaning this test can be used) for the duration of the COVID-19 declaration under Section 564(b)(1) of the Act, 21 U.S.C.section 360bbb-3(b)(1), unless the authorization is terminated  or revoked sooner.       Influenza A by PCR NEGATIVE NEGATIVE Final   Influenza B by PCR NEGATIVE NEGATIVE Final    Comment: (NOTE) The Xpert Xpress SARS-CoV-2/FLU/RSV plus assay is intended as an aid in the diagnosis of influenza from Nasopharyngeal swab specimens and should not be used as a sole basis for treatment. Nasal washings and aspirates are unacceptable for Xpert Xpress SARS-CoV-2/FLU/RSV testing.  Fact Sheet for Patients: EntrepreneurPulse.com.au  Fact Sheet for Healthcare Providers: IncredibleEmployment.be  This test is not yet approved or cleared by the [place] FDA and has been authorized for detection and/or diagnosis of SARS-CoV-2 by FDA under an Emergency Use Authorization (EUA). This EUA will remain in effect (meaning this test can be used) for the duration of the COVID-19 declaration under Section 564(b)(1) of the Act, 21 U.S.C. section 360bbb-3(b)(1), unless the authorization is terminated or revoked.  Performed at [facility], [place] [address] [place], [place] [address]          Radiology Studies: DG Chest 2 View  Result Date: 02/02/2022 CLINICAL  DATA:  Bronchi. Trouble swallowing. History of lung cancer EXAM: CHEST - 2 VIEW COMPARISON:  01/07/2022 FINDINGS: Stable right suprahilar nodule, presumed site of right lung radiotherapy. Generalized interstitial coarsening from emphysema. Normal heart size and mediastinal contours. There is no edema, consolidation, effusion, or pneumothorax. IMPRESSION: 1. No acute finding. 2. Emphysema and treated right lung cancer. Electronically Signed   By: Jorje Guild M.D.   On: 02/02/2022 07:17        Scheduled Meds:  enoxaparin (LOVENOX) injection  60 mg  Subcutaneous BID   [START ON 02/04/2022] enoxaparin (LOVENOX) injection  60 mg Subcutaneous BID   ipratropium-albuterol  3 mL Nebulization TID   nicotine  14 mg Transdermal Daily   pantoprazole (PROTONIX) IV  40 mg Intravenous Q12H   sucralfate  1 g Oral TID WC & HS   Continuous Infusions:  0.9 % NaCl with KCl 20 mEq / L Stopped (02/02/22 1054)          Aline August, MD [facility] 02/03/2022, 9:45 AM

## 2022-02-03 NOTE — Progress Notes (Signed)
Subjective: Patient is able to tolerate clear liquids post food bolus removal via EGD yesterday. She complains of generalized body aches.  Objective: Vital signs in last 24 hours: Temp:  [97.5 F (36.4 C)-98.5 F (36.9 C)] 97.7 F (36.5 C) (03/12 0510) Pulse Rate:  [109-134] 109 (03/12 0510) Resp:  [16-29] 18 (03/12 0510) BP: (88-141)/(62-98) 106/81 (03/12 0510) SpO2:  [90 %-100 %] 99 % (03/12 0759) Weight:  [63.5 kg-68.1 kg] 68.1 kg (03/12 0510) Weight change: 0 kg Last BM Date : 02/01/22  PE: Mild pallor GENERAL: Not in distress, able to speak in full sentences  ABDOMEN: Soft, nondistended, nontender, normoactive bowel sounds EXTREMITIES: No deformity, no edema  Lab Results: Results for orders placed or performed during the hospital encounter of 02/02/22 (from the past 48 hour(s))  CBC with Differential     Status: Abnormal   Collection Time: 02/02/22  5:55 AM  Result Value Ref Range   WBC 6.9 4.0 - 10.5 K/uL   RBC 3.59 (L) 3.87 - 5.11 MIL/uL   Hemoglobin 12.3 12.0 - 15.0 g/dL   HCT 16.1 09.6 - 04.5 %   MCV 101.4 (H) 80.0 - 100.0 fL   MCH 34.3 (H) 26.0 - 34.0 pg   MCHC 33.8 30.0 - 36.0 g/dL   RDW 40.9 (H) 81.1 - 91.4 %   Platelets 221 150 - 400 K/uL   nRBC 0.0 0.0 - 0.2 %   Neutrophils Relative % 69 %   Neutro Abs 4.8 1.7 - 7.7 K/uL   Lymphocytes Relative 17 %   Lymphs Abs 1.2 0.7 - 4.0 K/uL   Monocytes Relative 11 %   Monocytes Absolute 0.7 0.1 - 1.0 K/uL   Eosinophils Relative 2 %   Eosinophils Absolute 0.1 0.0 - 0.5 K/uL   Basophils Relative 0 %   Basophils Absolute 0.0 0.0 - 0.1 K/uL   Immature Granulocytes 1 %   Abs Immature Granulocytes 0.07 0.00 - 0.07 K/uL    Comment: Performed at [facility], [address], [place], [place] [address]  Basic metabolic panel     Status: Abnormal   Collection Time: 02/02/22  5:55 AM  Result Value Ref Range   Sodium 139 135 - 145 mmol/L   Potassium 3.2 (L) 3.5 - 5.1 mmol/L   Chloride 108 98 - 111  mmol/L   CO2 21 (L) 22 - 32 mmol/L   Glucose, Bld 95 70 - 99 mg/dL    Comment: Glucose reference range applies only to samples taken after fasting for at least 8 hours.   BUN 10 6 - 20 mg/dL   Creatinine, Ser 6.21 0.44 - 1.00 mg/dL   Calcium 9.8 8.9 - 30.8 mg/dL   GFR, Estimated >65 >78 mL/min    Comment: (NOTE) Calculated using the CKD-EPI Creatinine Equation (2021)    Anion gap 10 5 - 15    Comment: Performed at [facility], [address], [place], [place] [address]  Magnesium     Status: None   Collection Time: 02/02/22  5:55 AM  Result Value Ref Range   Magnesium 1.7 1.7 - 2.4 mg/dL    Comment: Performed at [facility], [address], [place], [place] [address]  Phosphorus     Status: None   Collection Time: 02/02/22  5:55 AM  Result Value Ref Range   Phosphorus 3.0 2.5 - 4.6 mg/dL    Comment: Performed at [facility], [address] [place], [place] [address]  Hepatic function panel     Status: None   Collection Time: 02/02/22  5:55 AM  Result Value Ref Range   Total Protein 6.8 6.5 - 8.1 g/dL   Albumin 3.5 3.5 - 5.0 g/dL   AST 16 15 - 41 U/L   ALT 13 0 - 44 U/L   Alkaline Phosphatase 77 38 - 126 U/L   Total Bilirubin 0.5 0.3 - 1.2 mg/dL   Bilirubin, Direct 0.1 0.0 - 0.2 mg/dL   Indirect Bilirubin 0.4 0.3 - 0.9 mg/dL    Comment: Performed at [facility], [address], [place], [place] [address]  Resp Panel by RT-PCR (Flu A&B, Covid) Nasopharyngeal Swab     Status: None   Collection Time: 02/02/22  6:13 AM   Specimen: Nasopharyngeal Swab; Nasopharyngeal(NP) swabs in vial transport medium  Result Value Ref Range   SARS Coronavirus 2 by RT PCR NEGATIVE NEGATIVE    Comment: (NOTE) SARS-CoV-2 target nucleic acids are NOT DETECTED.  The SARS-CoV-2 RNA is generally detectable in upper respiratory specimens during the acute phase of infection. The lowest concentration of SARS-CoV-2 viral  copies this assay can detect is 138 copies/mL. A negative result does not preclude SARS-Cov-2 infection and should not be used as the sole basis for treatment or other patient management decisions. A negative result may occur with  improper specimen collection/handling, submission of specimen other than nasopharyngeal swab, presence of viral mutation(s) within the areas targeted by this assay, and inadequate number of viral copies(<138 copies/mL). A negative result must be combined with clinical observations, patient history, and epidemiological information. The expected result is Negative.  Fact Sheet for Patients:  BloggerCourse.com  Fact Sheet for Healthcare Providers:  SeriousBroker.it  This test is no t yet approved or cleared by the [place] FDA and  has been authorized for detection and/or diagnosis of SARS-CoV-2 by FDA under an Emergency Use Authorization (EUA). This EUA will remain  in effect (meaning this test can be used) for the duration of the COVID-19 declaration under Section 564(b)(1) of the Act, 21 U.S.C.section 360bbb-3(b)(1), unless the authorization is terminated  or revoked sooner.       Influenza A by PCR NEGATIVE NEGATIVE   Influenza B by PCR NEGATIVE NEGATIVE    Comment: (NOTE) The Xpert Xpress SARS-CoV-2/FLU/RSV plus assay is intended as an aid in the diagnosis of influenza from Nasopharyngeal swab specimens and should not be used as a sole basis for treatment. Nasal washings and aspirates are unacceptable for Xpert Xpress SARS-CoV-2/FLU/RSV testing.  Fact Sheet for Patients: BloggerCourse.com  Fact Sheet for Healthcare Providers: SeriousBroker.it  This test is not yet approved or cleared by the [place] FDA and has been authorized for detection and/or diagnosis of SARS-CoV-2 by FDA under an Emergency Use Authorization (EUA). This EUA will  remain in effect (meaning this test can be used) for the duration of the COVID-19 declaration under Section 564(b)(1) of the Act, 21 U.S.C. section 360bbb-3(b)(1), unless the authorization is terminated or revoked.  Performed at [facility], [address] [place], [place] [address]   CBC     Status: Abnormal   Collection Time: 02/03/22  6:04 AM  Result Value Ref Range   WBC 10.5 4.0 - 10.5 K/uL   RBC 2.79 (L) 3.87 - 5.11 MIL/uL   Hemoglobin 9.6 (L) 12.0 - 15.0 g/dL   HCT 64.4 (L) 03.4 - 74.2 %   MCV 103.2 (H) 80.0 - 100.0 fL   MCH 34.4 (  H) 26.0 - 34.0 pg   MCHC 33.3 30.0 - 36.0 g/dL   RDW 86.5 (H) 78.4 - 69.6 %   Platelets 169 150 - 400 K/uL   nRBC 0.0 0.0 - 0.2 %    Comment: Performed at [facility], [address] [place], [place] [address]  Basic metabolic panel     Status: Abnormal   Collection Time: 02/03/22  6:04 AM  Result Value Ref Range   Sodium 135 135 - 145 mmol/L   Potassium 4.1 3.5 - 5.1 mmol/L    Comment: DELTA CHECK NOTED NO VISIBLE HEMOLYSIS    Chloride 108 98 - 111 mmol/L   CO2 20 (L) 22 - 32 mmol/L   Glucose, Bld 117 (H) 70 - 99 mg/dL    Comment: Glucose reference range applies only to samples taken after fasting for at least 8 hours.   BUN 8 6 - 20 mg/dL   Creatinine, Ser 4.13 0.44 - 1.00 mg/dL   Calcium 9.1 8.9 - 24.4 mg/dL   GFR, Estimated >01 >02 mL/min    Comment: (NOTE) Calculated using the CKD-EPI Creatinine Equation (2021)    Anion gap 7 5 - 15    Comment: Performed at [facility], [address] [place], [place] [address]  Vitamin B12     Status: None   Collection Time: 02/03/22  6:04 AM  Result Value Ref Range   Vitamin B-12 448 180 - 914 pg/mL    Comment: (NOTE) This assay is not validated for testing neonatal or myeloproliferative syndrome specimens for Vitamin B12 levels. Performed at [facility], [address], [place], [place] [address]      Studies/Results: DG Chest 2 View  Result Date: 02/02/2022 CLINICAL DATA:  Bronchi. Trouble swallowing. History of lung cancer EXAM: CHEST - 2 VIEW COMPARISON:  01/07/2022 FINDINGS: Stable right suprahilar nodule, presumed site of right lung radiotherapy. Generalized interstitial coarsening from emphysema. Normal heart size and mediastinal contours. There is no edema, consolidation, effusion, or pneumothorax. IMPRESSION: 1. No acute finding. 2. Emphysema and treated right lung cancer. Electronically Signed   By: Tiburcio Pea M.D.   On: 02/02/2022 07:17    Medications: I have reviewed the patient's current medications.  Assessment: Food bolus impaction-status post removal yesterday via EGD  Esophageal stricture, unable to pass adult scope, dilation not performed yesterday as patient had taken Eliquis the day before(last dose 02/01/22) and had received Lovenox 30 minutes before the procedure.  Lung cancer, history of chemotherapy and radiation  Macrocytic anemia, hemoglobin 9.6, MCV 103.2  Plan: Continue PPI and sucralfate. Hold morning dose of Lovenox. EGD with balloon dilation to be attempted tomorrow morning. The risks and the benefits of the procedure were discussed with the patient in details. She understands and verbalizes consent.  Kerin Salen, MD 02/03/2022, 9:08 AM

## 2022-02-03 NOTE — H&P (View-Only) (Signed)
Subjective: Patient is able to tolerate clear liquids post food bolus removal via EGD yesterday. She complains of generalized body aches.  Objective: Vital signs in last 24 hours: Temp:  [97.5 F (36.4 C)-98.5 F (36.9 C)] 97.7 F (36.5 C) (03/12 0510) Pulse Rate:  [109-134] 109 (03/12 0510) Resp:  [16-29] 18 (03/12 0510) BP: (88-141)/(62-98) 106/81 (03/12 0510) SpO2:  [90 %-100 %] 99 % (03/12 0759) Weight:  [63.5 kg-68.1 kg] 68.1 kg (03/12 0510) Weight change: 0 kg Last BM Date : 02/01/22  PE: Mild pallor GENERAL: Not in distress, able to speak in full sentences  ABDOMEN: Soft, nondistended, nontender, normoactive bowel sounds EXTREMITIES: No deformity, no edema  Lab Results: Results for orders placed or performed during the hospital encounter of 02/02/22 (from the past 48 hour(s))  CBC with Differential     Status: Abnormal   Collection Time: 02/02/22  5:55 AM  Result Value Ref Range   WBC 6.9 4.0 - 10.5 K/uL   RBC 3.59 (L) 3.87 - 5.11 MIL/uL   Hemoglobin 12.3 12.0 - 15.0 g/dL   HCT 36.4 36.0 - 46.0 %   MCV 101.4 (H) 80.0 - 100.0 fL   MCH 34.3 (H) 26.0 - 34.0 pg   MCHC 33.8 30.0 - 36.0 g/dL   RDW 19.5 (H) 11.5 - 15.5 %   Platelets 221 150 - 400 K/uL   nRBC 0.0 0.0 - 0.2 %   Neutrophils Relative % 69 %   Neutro Abs 4.8 1.7 - 7.7 K/uL   Lymphocytes Relative 17 %   Lymphs Abs 1.2 0.7 - 4.0 K/uL   Monocytes Relative 11 %   Monocytes Absolute 0.7 0.1 - 1.0 K/uL   Eosinophils Relative 2 %   Eosinophils Absolute 0.1 0.0 - 0.5 K/uL   Basophils Relative 0 %   Basophils Absolute 0.0 0.0 - 0.1 K/uL   Immature Granulocytes 1 %   Abs Immature Granulocytes 0.07 0.00 - 0.07 K/uL    Comment: Performed at [facility], [place] [address], [place], [place] [address]  Basic metabolic panel     Status: Abnormal   Collection Time: 02/02/22  5:55 AM  Result Value Ref Range   Sodium 139 135 - 145 mmol/L   Potassium 3.2 (L) 3.5 - 5.1 mmol/L   Chloride 108 98 - 111  mmol/L   CO2 21 (L) 22 - 32 mmol/L   Glucose, Bld 95 70 - 99 mg/dL    Comment: Glucose reference range applies only to samples taken after fasting for at least 8 hours.   BUN 10 6 - 20 mg/dL   Creatinine, Ser 0.89 0.44 - 1.00 mg/dL   Calcium 9.8 8.9 - 10.3 mg/dL   GFR, Estimated >60 >60 mL/min    Comment: (NOTE) Calculated using the CKD-EPI Creatinine Equation (2021)    Anion gap 10 5 - 15    Comment: Performed at [facility], [place] [address] [place], [place] [address]  Magnesium     Status: None   Collection Time: 02/02/22  5:55 AM  Result Value Ref Range   Magnesium 1.7 1.7 - 2.4 mg/dL    Comment: Performed at [facility], [place] [address] [place], [place] [address]  Phosphorus     Status: None   Collection Time: 02/02/22  5:55 AM  Result Value Ref Range   Phosphorus 3.0 2.5 - 4.6 mg/dL    Comment: Performed at [facility], [place] [address] [place], [place] [address]  Hepatic function panel     Status: None   Collection Time: 02/02/22  5:55 AM  Result Value Ref Range   Total Protein 6.8 6.5 - 8.1 g/dL   Albumin 3.5 3.5 - 5.0 g/dL   AST 16 15 - 41 U/L   ALT 13 0 - 44 U/L   Alkaline Phosphatase 77 38 - 126 U/L   Total Bilirubin 0.5 0.3 - 1.2 mg/dL   Bilirubin, Direct 0.1 0.0 - 0.2 mg/dL   Indirect Bilirubin 0.4 0.3 - 0.9 mg/dL    Comment: Performed at [facility], [place] [address], [place], [place] [address]  Resp Panel by RT-PCR (Flu A&B, Covid) Nasopharyngeal Swab     Status: None   Collection Time: 02/02/22  6:13 AM   Specimen: Nasopharyngeal Swab; Nasopharyngeal(NP) swabs in vial transport medium  Result Value Ref Range   SARS Coronavirus 2 by RT PCR NEGATIVE NEGATIVE    Comment: (NOTE) SARS-CoV-2 target nucleic acids are NOT DETECTED.  The SARS-CoV-2 RNA is generally detectable in upper respiratory specimens during the acute phase of infection. The lowest concentration of SARS-CoV-2 viral  copies this assay can detect is 138 copies/mL. A negative result does not preclude SARS-Cov-2 infection and should not be used as the sole basis for treatment or other patient management decisions. A negative result may occur with  improper specimen collection/handling, submission of specimen other than nasopharyngeal swab, presence of viral mutation(s) within the areas targeted by this assay, and inadequate number of viral copies(<138 copies/mL). A negative result must be combined with clinical observations, patient history, and epidemiological information. The expected result is Negative.  Fact Sheet for Patients:  EntrepreneurPulse.com.au  Fact Sheet for Healthcare Providers:  IncredibleEmployment.be  This test is no t yet approved or cleared by the [place] FDA and  has been authorized for detection and/or diagnosis of SARS-CoV-2 by FDA under an Emergency Use Authorization (EUA). This EUA will remain  in effect (meaning this test can be used) for the duration of the COVID-19 declaration under Section 564(b)(1) of the Act, 21 U.S.C.section 360bbb-3(b)(1), unless the authorization is terminated  or revoked sooner.       Influenza A by PCR NEGATIVE NEGATIVE   Influenza B by PCR NEGATIVE NEGATIVE    Comment: (NOTE) The Xpert Xpress SARS-CoV-2/FLU/RSV plus assay is intended as an aid in the diagnosis of influenza from Nasopharyngeal swab specimens and should not be used as a sole basis for treatment. Nasal washings and aspirates are unacceptable for Xpert Xpress SARS-CoV-2/FLU/RSV testing.  Fact Sheet for Patients: EntrepreneurPulse.com.au  Fact Sheet for Healthcare Providers: IncredibleEmployment.be  This test is not yet approved or cleared by the [place] FDA and has been authorized for detection and/or diagnosis of SARS-CoV-2 by FDA under an Emergency Use Authorization (EUA). This EUA will  remain in effect (meaning this test can be used) for the duration of the COVID-19 declaration under Section 564(b)(1) of the Act, 21 U.S.C. section 360bbb-3(b)(1), unless the authorization is terminated or revoked.  Performed at [facility], [place] [address] [place], [place] [address]   CBC     Status: Abnormal   Collection Time: 02/03/22  6:04 AM  Result Value Ref Range   WBC 10.5 4.0 - 10.5 K/uL   RBC 2.79 (L) 3.87 - 5.11 MIL/uL   Hemoglobin 9.6 (L) 12.0 - 15.0 g/dL   HCT 28.8 (L) 36.0 - 46.0 %   MCV 103.2 (H) 80.0 - 100.0 fL   MCH 34.4 (  H) 26.0 - 34.0 pg   MCHC 33.3 30.0 - 36.0 g/dL   RDW 19.6 (H) 11.5 - 15.5 %   Platelets 169 150 - 400 K/uL   nRBC 0.0 0.0 - 0.2 %    Comment: Performed at [facility], [place] [address], [place], [place] [address]  Basic metabolic panel     Status: Abnormal   Collection Time: 02/03/22  6:04 AM  Result Value Ref Range   Sodium 135 135 - 145 mmol/L   Potassium 4.1 3.5 - 5.1 mmol/L    Comment: DELTA CHECK NOTED NO VISIBLE HEMOLYSIS    Chloride 108 98 - 111 mmol/L   CO2 20 (L) 22 - 32 mmol/L   Glucose, Bld 117 (H) 70 - 99 mg/dL    Comment: Glucose reference range applies only to samples taken after fasting for at least 8 hours.   BUN 8 6 - 20 mg/dL   Creatinine, Ser 0.81 0.44 - 1.00 mg/dL   Calcium 9.1 8.9 - 10.3 mg/dL   GFR, Estimated >60 >60 mL/min    Comment: (NOTE) Calculated using the CKD-EPI Creatinine Equation (2021)    Anion gap 7 5 - 15    Comment: Performed at [facility], [place] [address] [place], [place] [address]  Vitamin B12     Status: None   Collection Time: 02/03/22  6:04 AM  Result Value Ref Range   Vitamin B-12 448 180 - 914 pg/mL    Comment: (NOTE) This assay is not validated for testing neonatal or myeloproliferative syndrome specimens for Vitamin B12 levels. Performed at [facility], [place] [address] [place], [place] [address]      Studies/Results: DG Chest 2 View  Result Date: 02/02/2022 CLINICAL DATA:  Bronchi. Trouble swallowing. History of lung cancer EXAM: CHEST - 2 VIEW COMPARISON:  01/07/2022 FINDINGS: Stable right suprahilar nodule, presumed site of right lung radiotherapy. Generalized interstitial coarsening from emphysema. Normal heart size and mediastinal contours. There is no edema, consolidation, effusion, or pneumothorax. IMPRESSION: 1. No acute finding. 2. Emphysema and treated right lung cancer. Electronically Signed   By: Jorje Guild M.D.   On: 02/02/2022 07:17    Medications: I have reviewed the patient's current medications.  Assessment: Food bolus impaction-status post removal yesterday via EGD  Esophageal stricture, unable to pass adult scope, dilation not performed yesterday as patient had taken Eliquis the day before(last dose 02/01/22) and had received Lovenox 30 minutes before the procedure.  Lung cancer, history of chemotherapy and radiation  Macrocytic anemia, hemoglobin 9.6, MCV 103.2  Plan: Continue PPI and sucralfate. Hold morning dose of Lovenox. EGD with balloon dilation to be attempted tomorrow morning. The risks and the benefits of the procedure were discussed with the patient in details. She understands and verbalizes consent.  Ronnette Juniper, MD 02/03/2022, 9:08 AM

## 2022-02-03 NOTE — Progress Notes (Signed)
?   02/03/22 1718  ?Pain Assessment  ?Pain Score 8  ?Pain Type Acute pain  ?Pain Location Throat  ?Pain Descriptors / Indicators Aching  ?Pain Frequency Constant  ?Pain Onset On-going  ?Pain Intervention(s) Medication (See eMAR)  ?Multiple Pain Sites Yes  ?PAINAD (Pain Assessment in Advanced Dementia)  ?Breathing 0  ?POSS Scale ([place] Opioid Sedation Scale)  ?POSS *See Group Information* 1-Acceptable,Awake and alert  ?PCA/Epidural/Spinal Assessment  ?Respiratory Pattern Regular;Unlabored  ?Provider Notification  ?Provider Name/Title Alekh,Kshitiz  ?Date Provider Notified 02/03/22  ?Time Provider Notified 1718  ?Notification Type  ?(chat room)  ?Notification Reason Other (Comment) ?(Heart rate up)  ?Provider response No new orders  ?Date of Provider Response 02/03/22  ?Time of Provider Response 1720  ? ? ?

## 2022-02-03 NOTE — Plan of Care (Signed)

## 2022-02-04 ENCOUNTER — Encounter (HOSPITAL_COMMUNITY): Payer: Self-pay | Admitting: Internal Medicine

## 2022-02-04 ENCOUNTER — Encounter (HOSPITAL_COMMUNITY)
Admission: EM | Disposition: A | Discharge status: Home / Self Care | Payer: Self-pay | Source: Home / Self Care | Attending: Internal Medicine

## 2022-02-04 ENCOUNTER — Inpatient Hospital Stay (HOSPITAL_COMMUNITY): Payer: Medicaid Other | Admitting: Anesthesiology

## 2022-02-04 DIAGNOSIS — K222 Esophageal obstruction: Secondary | ICD-10-CM

## 2022-02-04 DIAGNOSIS — T18128A Food in esophagus causing other injury, initial encounter: Secondary | ICD-10-CM

## 2022-02-04 DIAGNOSIS — D696 Thrombocytopenia, unspecified: Secondary | ICD-10-CM

## 2022-02-04 DIAGNOSIS — J449 Chronic obstructive pulmonary disease, unspecified: Secondary | ICD-10-CM

## 2022-02-04 DIAGNOSIS — K208 Other esophagitis without bleeding: Secondary | ICD-10-CM

## 2022-02-04 LAB — CBC WITH DIFFERENTIAL/PLATELET
Abs Immature Granulocytes: 0.08 10*3/uL — ABNORMAL HIGH (ref 0.00–0.07)
Basophils Absolute: 0 10*3/uL (ref 0.0–0.1)
Basophils Relative: 0 %
Eosinophils Absolute: 0.1 10*3/uL (ref 0.0–0.5)
Eosinophils Relative: 2 %
HCT: 27.1 % — ABNORMAL LOW (ref 36.0–46.0)
Hemoglobin: 8.8 g/dL — ABNORMAL LOW (ref 12.0–15.0)
Immature Granulocytes: 1 %
Lymphocytes Relative: 14 %
Lymphs Abs: 1 10*3/uL (ref 0.7–4.0)
MCH: 34.9 pg — ABNORMAL HIGH (ref 26.0–34.0)
MCHC: 32.5 g/dL (ref 30.0–36.0)
MCV: 107.5 fL — ABNORMAL HIGH (ref 80.0–100.0)
Monocytes Absolute: 0.8 10*3/uL (ref 0.1–1.0)
Monocytes Relative: 11 %
Neutro Abs: 5.4 10*3/uL (ref 1.7–7.7)
Neutrophils Relative %: 72 %
Platelets: 142 10*3/uL — ABNORMAL LOW (ref 150–400)
RBC: 2.52 MIL/uL — ABNORMAL LOW (ref 3.87–5.11)
RDW: 20.3 % — ABNORMAL HIGH (ref 11.5–15.5)
WBC: 7.4 10*3/uL (ref 4.0–10.5)
nRBC: 0 % (ref 0.0–0.2)

## 2022-02-04 LAB — BASIC METABOLIC PANEL
Anion gap: 6 (ref 5–15)
BUN: <5 mg/dL — ABNORMAL LOW (ref 6–20)
CO2: 18 mmol/L — ABNORMAL LOW (ref 22–32)
Calcium: 8.8 mg/dL — ABNORMAL LOW (ref 8.9–10.3)
Chloride: 112 mmol/L — ABNORMAL HIGH (ref 98–111)
Creatinine, Ser: 0.67 mg/dL (ref 0.44–1.00)
GFR, Estimated: >60 mL/min (ref >60)
Glucose, Bld: 83 mg/dL (ref 70–99)
Potassium: 4.5 mmol/L (ref 3.5–5.1)
Sodium: 136 mmol/L (ref 135–145)

## 2022-02-04 LAB — MAGNESIUM: Magnesium: 1.7 mg/dL (ref 1.7–2.4)

## 2022-02-04 SURGERY — ESOPHAGOGASTRODUODENOSCOPY (EGD) WITH PROPOFOL
Anesthesia: Monitor Anesthesia Care

## 2022-02-04 MED ORDER — ACETAMINOPHEN 325 MG PO TABS
650.0000 mg | ORAL_TABLET | Freq: Four times a day (QID) | ORAL | Status: DC | PRN
  Administered 2022-02-04 – 2022-02-07 (×4): 650 mg via ORAL
  Filled 2022-02-04 (×4): qty 2

## 2022-02-04 MED ORDER — PHENYLEPHRINE 40 MCG/ML (10ML) SYRINGE FOR IV PUSH (FOR BLOOD PRESSURE SUPPORT)
PREFILLED_SYRINGE | INTRAVENOUS | Status: DC | PRN
  Administered 2022-02-04: 80 ug via INTRAVENOUS

## 2022-02-04 MED ORDER — LIDOCAINE 2% (20 MG/ML) 5 ML SYRINGE
INTRAMUSCULAR | Status: DC | PRN
  Administered 2022-02-04: 60 mg via INTRAVENOUS

## 2022-02-04 MED ORDER — ADULT MULTIVITAMIN LIQUID CH
15.0000 mL | Freq: Every day | ORAL | Status: DC
  Filled 2022-02-04 (×5): qty 15

## 2022-02-04 MED ORDER — DEXMEDETOMIDINE (PRECEDEX) IN NS 20 MCG/5ML (4 MCG/ML) IV SYRINGE
PREFILLED_SYRINGE | INTRAVENOUS | Status: DC | PRN
  Administered 2022-02-04: 4 ug via INTRAVENOUS
  Administered 2022-02-04: 8 ug via INTRAVENOUS

## 2022-02-04 MED ORDER — PROPOFOL 500 MG/50ML IV EMUL
INTRAVENOUS | Status: DC | PRN
  Administered 2022-02-04: 200 ug/kg/min via INTRAVENOUS

## 2022-02-04 MED ORDER — LACTATED RINGERS IV SOLN: INTRAVENOUS | Status: DC

## 2022-02-04 SURGICAL SUPPLY — 15 items

## 2022-02-04 NOTE — Anesthesia Procedure Notes (Signed)
Procedure Name: [place] ?Date/Time: 02/04/2022 11:44 AM ?Performed by: Lieutenant Diego, CRNA ?Pre-anesthesia Checklist: Patient identified, Emergency Drugs available, Suction available, Patient being monitored and Timeout performed ?Patient Re-evaluated:Patient Re-evaluated prior to induction ?Oxygen Delivery Method: Simple face mask ?Preoxygenation: Pre-oxygenation with 100% oxygen ?Induction Type: IV induction ? ? ? ? ?

## 2022-02-04 NOTE — Op Note (Addendum)
[facility] ?Patient Name: [name] ?Procedure Date: 02/04/2022 ?MRN: [id number] ?Attending MD: Otis Brace , MD ?Date of Birth: 11-20-1971 ?CSN: [id number] ?Age: 51 ?Admit Type: Inpatient ?Procedure:                Upper GI endoscopy ?Indications:              Dysphagia, For therapy of esophageal stenosis ?Providers:                Otis Brace, MD, Dulcy Fanny, Primitivo Gauze  ?                          [place], Technician, [employer],  ?                          Technician ?Referring MD:              ?Medicines:                Sedation Administered by an Anesthesia Professional ?Complications:            No immediate complications. ?Estimated Blood Loss:     Estimated blood loss was minimal. ?Procedure:                Pre-Anesthesia Assessment: ?                          - Prior to the procedure, a History and Physical  ?                          was performed, and patient medications and  ?                          allergies were reviewed. The patient's tolerance of  ?                          previous anesthesia was also reviewed. The risks  ?                          and benefits of the procedure and the sedation  ?                          options and risks were discussed with the patient.  ?                          All questions were answered, and informed consent  ?                          was obtained. Prior Anticoagulants: The patient has  ?                          taken Eliquis (apixaban), last dose was 2 days  ?                          prior to procedure. ASA Grade Assessment: III - A  ?  patient with severe systemic disease. After  ?                          reviewing the risks and benefits, the patient was  ?                          deemed in satisfactory condition to undergo the  ?                          procedure. ?                          After obtaining informed consent, the endoscope was  ?                          passed  under direct vision. Throughout the  ?                          procedure, the patient's blood pressure, pulse, and  ?                          oxygen saturations were monitored continuously. The  ?                          [id number]-[id number] ([id number]) Olympus endoscope was introduced  ?                          through the mouth, and advanced to the second part  ?                          of duodenum. The upper GI endoscopy was  ?                          accomplished without difficulty. The patient  ?                          tolerated the procedure well. ?Scope In: ?Scope Out: ?Findings: ?     One benign-appearing, intrinsic moderate (circumferential scarring or  ?     stenosis; an endoscope may pass) stenosis was found 24 cm from the  ?     incisors. The stenosis was traversed. I was initially planning to do  ?     dilation with 8 to 10 through-the-scope balloon but noted a superficial  ?     mucosal tear on withdrawal at the stricture site. Dilation was  ?     subsequently not performed. ?     LA Grade D (one or more mucosal breaks involving at least 75% of  ?     esophageal circumference) esophagitis with no bleeding was found in the  ?     mid esophagus. ?     A medium amount of food (residue) was found in the gastric body. ?     The cardia and gastric fundus were normal on retroflexion. ?     The duodenal bulb, first portion of the duodenum and second portion of  ?     the duodenum were normal. ?Impression:               -  Benign-appearing esophageal stenosis. ?                          - LA Grade D esophagitis with no bleeding. ?                          - A medium amount of food (residue) in the stomach. ?                          - Normal duodenal bulb, first portion of the  ?                          duodenum and second portion of the duodenum. ?                          - No specimens collected. ?Moderate Sedation: ?     Moderate (conscious) sedation was personally administered by an  ?     anesthesia  professional. The following parameters were monitored: oxygen  ?     saturation, heart rate, blood pressure, and response to care. ?Recommendation:           - Return patient to hospital ward for ongoing care. ?                          - Full liquid diet. ?                          - Continue present medications. ?Procedure Code(s):        --- Professional --- ?                          [phone], Esophagogastroduodenoscopy, flexible,  ?                          transoral; diagnostic, including collection of  ?                          specimen(s) by brushing or washing, when performed  ?                          (separate procedure) ?Diagnosis Code(s):        --- Professional --- ?                          K22.2, Esophageal obstruction ?                          R13.10, Dysphagia, unspecified ?CPT copyright 2019 American Medical Association. All rights reserved. ?The codes documented in this report are preliminary and upon coder review may  ?be revised to meet current compliance requirements. ?Otis Brace, MD ?Otis Brace, MD ?02/04/2022 11:53:36 AM ?Number of Addenda: 0 ?

## 2022-02-04 NOTE — Transfer of Care (Signed)
Immediate Anesthesia Transfer of Care Note ? ?Patient: [name] ? ?Procedure(s) Performed: ESOPHAGOGASTRODUODENOSCOPY (EGD) WITH PROPOFOL ? ?Patient Location: Endoscopy Unit ? ?Anesthesia Type:MAC ? ?Level of Consciousness: awake ? ?Airway & Oxygen Therapy: Patient Spontanous Breathing and Patient connected to face mask oxygen ? ?Post-op Assessment: Report given to RN and Post -op Vital signs reviewed and stable ? ?Post vital signs: Reviewed and stable ? ?Last Vitals:  ?Vitals Value Taken Time  ?BP    ?Temp    ?Pulse    ?Resp 19 02/04/22 1148  ?SpO2    ?Vitals shown include unvalidated device data. ? ?Last Pain:  ?Vitals:  ? 02/04/22 1050  ?TempSrc: Temporal  ?PainSc: 5   ?   ? ?Patients Stated Pain Goal: 6 (02/02/22 1107) ? ?Complications: No notable events documented. ?

## 2022-02-04 NOTE — Brief Op Note (Signed)
02/02/2022 - 02/04/2022 ? ?11:54 AM ? ?PATIENT:  [name]  51 y.o. female ? ?PRE-OPERATIVE DIAGNOSIS:  esophageal stricture for balloon dilation ? ?POST-OPERATIVE DIAGNOSIS:  Retained food in stomach; Esophageal stricture; Esophageal tear ? ?PROCEDURE:  Procedure(s): ?ESOPHAGOGASTRODUODENOSCOPY (EGD) WITH PROPOFOL (N/A) ? ?SURGEON:  Surgeon(s) and Role: ?   * Otis Brace, MD - Primary ? ?Findings ?----------- ? ?One benign-appearing, intrinsic moderate (circumferential scarring or stenosis; an endoscope may pass) stenosis was found 24 cm from the incisors.  The stenosis was traversed. I was initially planning to do dilation with 8 to 10 through-the-scope balloon but noted a superficial mucosal tear on withdrawal at the stricture site.  Dilation was subsequently not performed ?-LA grade D esophagitis noted in the midesophagus ? ?Recommendations ?----------------------- ?-Continue IV twice daily PPI.  Recommend 40 mg twice daily PPI at least for next 2 to 3 months because of esophagitis ?-Full liquid diet today.  Advance diet to soft tomorrow ?-GI will follow if patient remains hospitalized. ? ?Otis Brace MD, FACP ?02/04/2022, 11:55 AM ? ?Contact #  [phone]  ?

## 2022-02-04 NOTE — Interval H&P Note (Signed)
History and Physical Interval Note: ? ?02/04/2022 ?10:56 AM ? ?[name]  has presented today for surgery, with the diagnosis of esophageal stricture for balloon dilation.  The various methods of treatment have been discussed with the patient and family. After consideration of risks, benefits and other options for treatment, the patient has consented to  Procedure(s): ?ESOPHAGOGASTRODUODENOSCOPY (EGD) WITH PROPOFOL (N/A) as a surgical intervention.  The patient's history has been reviewed, patient examined, no change in status, stable for surgery.  I have reviewed the patient's chart and labs.  Questions were answered to the patient's satisfaction.   ? ? ?Suzanna Zahn ? ? ?

## 2022-02-04 NOTE — Anesthesia Postprocedure Evaluation (Signed)
Anesthesia Post Note ? ?Patient: [name] ? ?Procedure(s) Performed: ESOPHAGOGASTRODUODENOSCOPY (EGD) WITH PROPOFOL ? ?  ? ?Patient location during evaluation: Endoscopy ?Anesthesia Type: MAC ?Level of consciousness: awake ?Pain management: pain level controlled ?Vital Signs Assessment: post-procedure vital signs reviewed and stable ?Respiratory status: spontaneous breathing, nonlabored ventilation, respiratory function stable and patient connected to nasal cannula oxygen ?Cardiovascular status: stable and blood pressure returned to baseline ?Postop Assessment: no apparent nausea or vomiting ?Anesthetic complications: no ? ? ?No notable events documented. ? ?Last Vitals:  ?Vitals:  ? 02/04/22 1230 02/04/22 2042  ?BP: 117/89   ?Pulse: (!) 109   ?Resp: 20   ?Temp: 36.5 ?C   ?SpO2: 93% 91%  ?  ?Last Pain:  ?Vitals:  ? 02/04/22 1922  ?TempSrc:   ?PainSc: 10-Worst pain ever  ? ? ?  ?  ?  ?  ?  ?  ? ?Leonda Cristo P Tamela Elsayed ? ? ? ? ?

## 2022-02-04 NOTE — Progress Notes (Signed)
Patient NPO for scheduled EGD. Mouth care given. ?

## 2022-02-04 NOTE — Progress Notes (Signed)
Initial Nutrition Assessment ? ?INTERVENTION:  ? ?-Magic cup TID with meals, each supplement provides 290 kcal and 9 grams of protein  ? ?-Multivitamin with minerals daily ? ?NUTRITION DIAGNOSIS:  ? ?Increased nutrient needs related to cancer and cancer related treatments as evidenced by estimated needs. ? ?GOAL:  ? ?Patient will meet greater than or equal to 90% of their needs ? ?MONITOR:  ? ?Diet advancement, Labs, Weight trends, I & O's ? ?REASON FOR ASSESSMENT:  ? ?Malnutrition Screening Tool ?  ? ?ASSESSMENT:  ? ?51 y.o. 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 ? ?Patient not in room at time of visit, in endoscopy for balloon dilation. ? ?Per chart review, pt was having difficult swallowing and pain with swallowing PTA. Pt was recently seen during last admission in February. Pt did not like the hospital food at that time and was not accepting protein supplements. Will follow-up to see if pt is more willing to drink protein supplements this admission. For now will order Magic cups with meals and daily MVI. ? ?Per weight records, pt has lost 20 lbs since 11/11/21 (11% wt loss x 3 months, significant for time frame). Weight has started to trend up since 3/2.  ? ?Medications: Carafate, Lactated ringers ? ?Labs reviewed. ? ?NUTRITION - FOCUSED PHYSICAL EXAM: ? ?Unable to complete, in endoscopy ? ?Diet Order:   ?Diet Order   ? ?       ?  Diet full liquid Room service appropriate? Yes; Fluid consistency: Thin  Diet effective now       ?  ? ?  ?  ? ?  ? ? ?EDUCATION NEEDS:  ? ?Not appropriate for education at this time ? ?Skin:  Skin Assessment: Reviewed RN Assessment ? ?Last BM:  3/10 ? ?Height:  ? ?Ht Readings from Last 1 Encounters:  ?02/02/22 5\' 8"  (1.727 m)  ? ? ?Weight:   ? ?Wt Readings from Last 1 Encounters:  ?02/03/22 68.1 kg  ? ? ?BMI:  Body mass index is 22.83 kg/m?. ? ?Estimated Nutritional Needs:  ? ?Kcal:  1850-2050 ? ?Protein:  95-105g ? ?Fluid:  2L/day ? ? ?Sheila Bibles, MS, RD, LDN ?Inpatient Clinical Dietitian ?Contact information available via Amion ? ?

## 2022-02-04 NOTE — Progress Notes (Signed)
?PROGRESS NOTE ? ? ? ?[name]  EGB:[id number] DOB: 12/30/1970 DOA: 02/02/2022 ?PCP: [name], PA-C  ? ?Brief Narrative:  ?51 y.o. 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  GI was consulted.  She underwent EGD on 02/02/2022. ? ?Assessment & Plan: ?  ?Dysphagia ?Esophageal stricture ?Radiation-induced esophagitis ?-Status post EGD on 02/02/2022.  GI planning for EGD with balloon dilatation possibly today ?-Continue PPI and sucralfate. ?-Continue current diet.  Diet advancement as per GI. ? ?Hypokalemia ?-Resolved ? ?Anemia of chronic disease ?Macrocytosis ?-Possibly from cancer and chemotherapy.  B12 level 448 ? ?DVT of upper extremity ?-Eliquis on hold.  Lovenox per pharmacy.  Hold dose of Lovenox this morning prior to EGD. ? ?History of lung cancer with radiation therapy ?-Outpatient follow-up with oncology ? ?Sinus tachycardia ?-Possibly from dehydration.  Continue IV fluids ? ?Thrombocytopenia ?-Questionable cause.  Monitor ? ? ? ?DVT prophylaxis: Lovenox ?Code Status: Full ?Family Communication:  ?Disposition Plan: ?Status is: inpatient because: Of need for GI procedure today.  Possible discharge in 1 to 2 days if cleared by GI and tolerating diet. ? ? ?Consultants: GI ? ?Procedures: EGD on 02/02/2022 ? ?Antimicrobials: None ? ? ?Subjective: ?Patient seen and examined at bedside.  No chest pain, worsening shortness of breath, fever reported.  Still intermittently nauseous. Complains of spasms in extremities; improving with medications. ?Objective: ?Vitals:  ? 02/03/22 1922 02/03/22 1941 02/03/22 2049 02/04/22 0445  ?BP:  112/90 114/77 105/77  ?Pulse:  (!) 129 (!) 118 (!) 104  ?Resp:  16 14 18   ?Temp:  98.3 ?F (36.8 ?C) 98.3 ?F (36.8 ?C) 97.8 ?F (36.6 ?C)   ?TempSrc:  Oral Oral Oral  ?SpO2: 91% 94% 92% 91%  ?Weight:      ?Height:      ? ? ?Intake/Output Summary (Last 24 hours) at 02/04/2022 0801 ?Last data filed at 02/04/2022 0200 ?Gross per 24 hour  ?Intake 1896.25 ml  ?Output --  ?Net 1896.25 ml  ? ? ?Filed Weights  ? 02/02/22 0523 02/02/22 1107 02/03/22 0510  ?Weight: 63.5 kg 63.5 kg 68.1 kg  ? ? ?Examination: ? ?General exam: No distress.  On room air currently.   ?Respiratory system: Decreased breath sounds at bases bilaterally, no wheezing cardiovascular system: Tachycardic intermittently; S1-S2 heard ?gastrointestinal system: Abdomen is distended slightly; soft and nontender.  Bowel sounds are heard  ?extremities: No edema or clubbing ? ? ? ?Data Reviewed: I have personally reviewed following labs and imaging studies ? ?CBC: ?Recent Labs  ?Lab 02/02/22 ?3710 02/03/22 ?0604 02/04/22 ?0421  ?WBC 6.9 10.5 7.4  ?NEUTROABS 4.8  --  5.4  ?HGB 12.3 9.6* 8.8*  ?HCT 36.4 28.8* 27.1*  ?MCV 101.4* 103.2* 107.5*  ?PLT 221 169 142*  ? ? ?Basic Metabolic Panel: ?Recent Labs  ?Lab 02/02/22 ?6269 02/03/22 ?0604 02/04/22 ?0421  ?NA 139 135 136  ?K 3.2* 4.1 4.5  ?CL 108 108 112*  ?CO2 21* 20* 18*  ?GLUCOSE 95 117* 83  ?BUN 10 8 <5*  ?CREATININE 0.89 0.81 0.67  ?CALCIUM 9.8 9.1 8.8*  ?MG 1.7  --  1.7  ?PHOS 3.0  --   --   ? ? ?GFR: ?Estimated Creatinine Clearance: 84.9 mL/min (by C-G formula based on SCr of 0.67 mg/dL). ?Liver Function Tests: ?Recent Labs  ?Lab 02/02/22 ?4854  ?AST 16  ?  ALT 13  ?ALKPHOS 77  ?BILITOT 0.5  ?PROT 6.8  ?ALBUMIN 3.5  ? ? ?No results for input(s): LIPASE, AMYLASE in the last 168 hours. ?No results for input(s): AMMONIA in the last 168 hours. ?Coagulation Profile: ?No results for input(s): INR, PROTIME in the last 168 hours. ?Cardiac Enzymes: ?No results for input(s): CKTOTAL, CKMB, CKMBINDEX, TROPONINI in the last 168 hours. ?BNP (last 3 results) ?No results for input(s): PROBNP in the last 8760 hours. ?HbA1C: ?No results for input(s): HGBA1C in the last  72 hours. ?CBG: ?No results for input(s): GLUCAP in the last 168 hours. ?Lipid Profile: ?No results for input(s): CHOL, HDL, LDLCALC, TRIG, CHOLHDL, LDLDIRECT in the last 72 hours. ?Thyroid Function Tests: ?No results for input(s): TSH, T4TOTAL, FREET4, T3FREE, THYROIDAB in the last 72 hours. ?Anemia Panel: ?Recent Labs  ?  02/03/22 ?0604  ?VITAMINB12 448  ? ? ?Sepsis Labs: ?No results for input(s): PROCALCITON, LATICACIDVEN in the last 168 hours. ? ?Recent Results (from the past 240 hour(s))  ?Resp Panel by RT-PCR (Flu A&B, Covid) Nasopharyngeal Swab     Status: None  ? Collection Time: 02/02/22  6:13 AM  ? Specimen: Nasopharyngeal Swab; Nasopharyngeal(NP) swabs in vial transport medium  ?Result Value Ref Range Status  ? SARS Coronavirus 2 by RT PCR NEGATIVE NEGATIVE Final  ?  Comment: (NOTE) ?SARS-CoV-2 target nucleic acids are NOT DETECTED. ? ?The SARS-CoV-2 RNA is generally detectable in upper respiratory ?specimens during the acute phase of infection. The lowest ?concentration of SARS-CoV-2 viral copies this assay can detect is ?138 copies/mL. A negative result does not preclude SARS-Cov-2 ?infection and should not be used as the sole basis for treatment or ?other patient management decisions. A negative result may occur with  ?improper specimen collection/handling, submission of specimen other ?than nasopharyngeal swab, presence of viral mutation(s) within the ?areas targeted by this assay, and inadequate number of viral ?copies(<138 copies/mL). A negative result must be combined with ?clinical observations, patient history, and epidemiological ?information. The expected result is Negative. ? ?Fact Sheet for Patients:  ?EntrepreneurPulse.com.au ? ?Fact Sheet for Healthcare Providers:  ?IncredibleEmployment.be ? ?This test is no t yet approved or cleared by the [place] FDA and  ?has been authorized for detection and/or diagnosis of SARS-CoV-2 by ?FDA under an Emergency  Use Authorization (EUA). This EUA will remain  ?in effect (meaning this test can be used) for the duration of the ?COVID-19 declaration under Section 564(b)(1) of the Act, 21 ?U.S.C.section 360bbb-3(b)(1), unless the authorization is terminated  ?or revoked sooner.  ? ? ?  ? Influenza A by PCR NEGATIVE NEGATIVE Final  ? Influenza B by PCR NEGATIVE NEGATIVE Final  ?  Comment: (NOTE) ?The Xpert Xpress SARS-CoV-2/FLU/RSV plus assay is intended as an aid ?in the diagnosis of influenza from Nasopharyngeal swab specimens and ?should not be used as a sole basis for treatment. Nasal washings and ?aspirates are unacceptable for Xpert Xpress SARS-CoV-2/FLU/RSV ?testing. ? ?Fact Sheet for Patients: ?EntrepreneurPulse.com.au ? ?Fact Sheet for Healthcare Providers: ?IncredibleEmployment.be ? ?This test is not yet approved or cleared by the [place] FDA and ?has been authorized for detection and/or diagnosis of SARS-CoV-2 by ?FDA under an Emergency Use Authorization (EUA). This EUA will remain ?in effect (meaning this test can be used) for the duration of the ?COVID-19 declaration under Section 564(b)(1) of the Act, 21 U.S.C. ?section 360bbb-3(b)(1), unless the authorization is terminated or ?revoked. ? ?Performed at [facility], [address] ?[place], [place] [address] ?  ? ?  ? ? ? ? ? ?  Radiology Studies: ?No results found. ? ? ? ? ? ?Scheduled Meds: ? enoxaparin (LOVENOX) injection  60 mg Subcutaneous BID  ? ipratropium-albuterol  3 mL Nebulization BID  ? nicotine  14 mg Transdermal Daily  ? pantoprazole (PROTONIX) IV  40 mg Intravenous Q12H  ? sucralfate  1 g Oral TID WC & HS  ? ?Continuous Infusions: ? sodium chloride    ? sodium chloride    ? sodium chloride 125 mL/hr at 02/04/22 0325  ? methocarbamol (ROBAXIN) IV 500 mg (02/04/22 0206)  ? ? ? ? ? ? ? ? ?Aline August, MD ?[facility] ?02/04/2022, 8:01 AM  ? ?

## 2022-02-04 NOTE — Progress Notes (Signed)
?   02/04/22 2125  ?Assess: MEWS Score  ?Temp 97.7 ?F (36.5 ?C)  ?BP 120/81  ?Pulse Rate (!) 122  ?Resp 18  ?SpO2 94 %  ?O2 Device Nasal Cannula  ?O2 Flow Rate (L/min) 2 L/min  ?Assess: MEWS Score  ?MEWS Temp 0  ?MEWS Systolic 0  ?MEWS Pulse 2  ?MEWS RR 0  ?MEWS LOC 0  ?MEWS Score 2  ?MEWS Score Color Yellow  ?Assess: if the MEWS score is Yellow or Red  ?Were vital signs taken at a resting state? Yes  ?Focused Assessment No change from prior assessment  ?Does the patient meet 2 or more of the SIRS criteria? No  ?MEWS guidelines implemented *See Row Information* No, vital signs rechecked  ?Treat  ?Pain Scale 0-10  ?Pain Score 9  ?Pain Type Acute pain  ?Pain Location Back  ?Pain Orientation Lower  ?Pain Descriptors / Indicators Discomfort  ?Pain Intervention(s) Heat applied  ?Multiple Pain Sites Yes  ?2nd Pain Site  ?Pain Score 9  ?Pain Type Acute pain  ?Pain Location Generalized  ?Pain Descriptors / Indicators Aching  ?Pain Intervention(s) Medication (See eMAR)  ?Take Vital Signs  ?Increase Vital Sign Frequency  Yellow: Q 2hr X 2 then Q 4hr X 2, if remains yellow, continue Q 4hrs  ?Escalate  ?MEWS: Escalate Yellow: discuss with charge nurse/RN and consider discussing with provider and RRT  ?Notify: Charge Nurse/RN  ?Name of Charge Nurse/RN Notified Renita RN  ?Date Charge Nurse/RN Notified 02/04/22  ?Time Charge Nurse/RN Notified 2244  ?Document  ?Patient Outcome Stabilized after interventions  ?Progress note created (see row info) Yes  ?Assess: SIRS CRITERIA  ?SIRS Temperature  0  ?SIRS Pulse 1  ?SIRS Respirations  0  ?SIRS WBC 0  ?SIRS Score Sum  1  ? ? ?

## 2022-02-04 NOTE — Anesthesia Preprocedure Evaluation (Signed)
Anesthesia Evaluation  ?Patient identified by MRN, date of birth, ID band ?Patient awake ? ? ? ?Reviewed: ?Allergy & Precautions, NPO status , Patient's Chart, lab work & pertinent test results ? ?Airway ?Mallampati: II ? ?TM Distance: >3 FB ?Neck ROM: Full ? ? ? Dental ? ?(+) Poor Dentition, Missing ?  ?Pulmonary ?asthma , COPD,  COPD inhaler, Current Smoker and Patient abstained from smoking.,  ?Lung cancer  ?  ?Pulmonary exam normal ?breath sounds clear to auscultation ? ? ? ? ? ? Cardiovascular ?negative cardio ROS ? ? ?Rhythm:Regular Rate:Tachycardia ? ? ?  ?Neuro/Psych ?PSYCHIATRIC DISORDERS Anxiety Depression  Neuromuscular disease   ? GI/Hepatic ?negative GI ROS, Neg liver ROS,   ?Endo/Other  ?negative endocrine ROS ? Renal/GU ?Renal disease  ? ?  ?Musculoskeletal ? ?(+) Arthritis ,  ? Abdominal ?  ?Peds ? Hematology ? ?(+) Blood dyscrasia, anemia ,   ?Anesthesia Other Findings ? esophageal stricture  ? Reproductive/Obstetrics ? ?  ? ? ? ? ? ? ? ? ? ? ? ? ? ?  ?  ? ? ? ? ? ? ? ? ?Anesthesia Physical ?Anesthesia Plan ? ?ASA: 3 ? ?Anesthesia Plan: MAC  ? ?Post-op Pain Management:   ? ?Induction: Intravenous ? ?PONV Risk Score and Plan: 1 and Propofol infusion and Treatment may vary due to age or medical condition ? ?Airway Management Planned: Nasal Cannula ? ?Additional Equipment:  ? ?Intra-op Plan:  ? ?Post-operative Plan:  ? ?Informed Consent: I have reviewed the patients History and Physical, chart, labs and discussed the procedure including the risks, benefits and alternatives for the proposed anesthesia with the patient or authorized representative who has indicated his/her understanding and acceptance.  ? ? ? ?Dental advisory given ? ?Plan Discussed with: CRNA ? ?Anesthesia Plan Comments:   ? ? ? ? ? ? ?Anesthesia Quick Evaluation ? ?

## 2022-02-05 ENCOUNTER — Encounter (HOSPITAL_COMMUNITY): Payer: Self-pay | Admitting: Gastroenterology

## 2022-02-05 LAB — CBC WITH DIFFERENTIAL/PLATELET
Abs Immature Granulocytes: 0.05 10*3/uL (ref 0.00–0.07)
Basophils Absolute: 0 10*3/uL (ref 0.0–0.1)
Basophils Relative: 0 %
Eosinophils Absolute: 0.1 10*3/uL (ref 0.0–0.5)
Eosinophils Relative: 1 %
HCT: 28 % — ABNORMAL LOW (ref 36.0–46.0)
Hemoglobin: 8.8 g/dL — ABNORMAL LOW (ref 12.0–15.0)
Immature Granulocytes: 1 %
Lymphocytes Relative: 10 %
Lymphs Abs: 0.8 10*3/uL (ref 0.7–4.0)
MCH: 33.8 pg (ref 26.0–34.0)
MCHC: 31.4 g/dL (ref 30.0–36.0)
MCV: 107.7 fL — ABNORMAL HIGH (ref 80.0–100.0)
Monocytes Absolute: 0.9 10*3/uL (ref 0.1–1.0)
Monocytes Relative: 11 %
Neutro Abs: 6.5 10*3/uL (ref 1.7–7.7)
Neutrophils Relative %: 77 %
Platelets: 141 10*3/uL — ABNORMAL LOW (ref 150–400)
RBC: 2.6 MIL/uL — ABNORMAL LOW (ref 3.87–5.11)
RDW: 19.9 % — ABNORMAL HIGH (ref 11.5–15.5)
WBC: 8.4 10*3/uL (ref 4.0–10.5)
nRBC: 0 % (ref 0.0–0.2)

## 2022-02-05 LAB — BASIC METABOLIC PANEL
Anion gap: 7 (ref 5–15)
BUN: 6 mg/dL (ref 6–20)
CO2: 21 mmol/L — ABNORMAL LOW (ref 22–32)
Calcium: 8.7 mg/dL — ABNORMAL LOW (ref 8.9–10.3)
Chloride: 106 mmol/L (ref 98–111)
Creatinine, Ser: 0.78 mg/dL (ref 0.44–1.00)
GFR, Estimated: >60 mL/min (ref >60)
Glucose, Bld: 83 mg/dL (ref 70–99)
Potassium: 4 mmol/L (ref 3.5–5.1)
Sodium: 134 mmol/L — ABNORMAL LOW (ref 135–145)

## 2022-02-05 LAB — MAGNESIUM: Magnesium: 1.4 mg/dL — ABNORMAL LOW (ref 1.7–2.4)

## 2022-02-05 MED ORDER — ONDANSETRON HCL 4 MG/2ML IJ SOLN
4.0000 mg | Freq: Four times a day (QID) | INTRAMUSCULAR | Status: DC
  Administered 2022-02-05 – 2022-02-08 (×11): 4 mg via INTRAVENOUS
  Filled 2022-02-05 (×10): qty 2

## 2022-02-05 MED ORDER — METHOCARBAMOL 1000 MG/10ML IJ SOLN
1000.0000 mg | Freq: Four times a day (QID) | INTRAVENOUS | Status: DC | PRN
  Administered 2022-02-05 – 2022-02-06 (×3): 1000 mg via INTRAVENOUS
  Filled 2022-02-05 (×3): qty 1000
  Filled 2022-02-05: qty 10

## 2022-02-05 MED ORDER — OXYCODONE-ACETAMINOPHEN 5-325 MG PO TABS
1.0000 | ORAL_TABLET | Freq: Four times a day (QID) | ORAL | Status: DC | PRN
  Administered 2022-02-07 – 2022-02-08 (×2): 1 via ORAL
  Filled 2022-02-05 (×2): qty 1

## 2022-02-05 MED ORDER — METOCLOPRAMIDE HCL 5 MG/ML IJ SOLN
10.0000 mg | Freq: Four times a day (QID) | INTRAMUSCULAR | Status: DC | PRN
  Administered 2022-02-05: 10 mg via INTRAVENOUS
  Filled 2022-02-05: qty 2

## 2022-02-05 MED ORDER — APIXABAN 5 MG PO TABS
5.0000 mg | ORAL_TABLET | Freq: Two times a day (BID) | ORAL | Status: DC
  Administered 2022-02-06 – 2022-02-08 (×5): 5 mg via ORAL
  Filled 2022-02-05 (×5): qty 1

## 2022-02-05 MED ORDER — MAGNESIUM SULFATE 2 GM/50ML IV SOLN
2.0000 g | Freq: Once | INTRAVENOUS | Status: AC
  Administered 2022-02-05: 2 g via INTRAVENOUS
  Filled 2022-02-05: qty 50

## 2022-02-05 NOTE — Progress Notes (Signed)
?PROGRESS NOTE ? ? ? ?[name]  UJW:[id number] DOB: 07/14/1971 DOA: 02/02/2022 ?PCP: Sheila Reichmann, PA-C  ? ?Brief Narrative:  ?51 y.o. 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  GI was consulted.  She underwent EGD on 02/02/2022 and again on 02/04/2022. ? ?Assessment & Plan: ?  ?Dysphagia ?Esophageal stricture ?Radiation-induced esophagitis ?-Status post EGD on 02/02/2022.  She underwent another EGD on 02/04/2022 which showed esophagitis along with esophageal stenosis with mucosal tear, hence dilation was not performed ?-Continue PPI and sucralfate. ?-Patient had vomiting yesterday.  Will try and advance to soft diet today as per GI recommendations. ? ?Hypokalemia ?-Resolved ? ?Hyponatremia ?-Mild.  Monitor.  Currently on IV fluids; decrease to 75 cc an hour ? ?Hypomagnesemia ?-Replace.  Repeat a.m. labs ? ?Anemia of chronic disease ?Macrocytosis ?-Possibly from cancer and chemotherapy.  B12 level 448 ? ?DVT of upper extremity ?-Eliquis on hold.  Currently on therapeutic dosing of Lovenox twice a day.  Will resume Eliquis today if cleared by GI. ? ?History of lung cancer with radiation therapy ?-Outpatient follow-up with oncology ? ?Sinus tachycardia ?-Possibly from dehydration.  Continue IV fluids as above ? ?Thrombocytopenia ?-Questionable cause.  Monitor ? ?Extremity spasms ?-Patient complains of severe extremity spasms.  Increase Robaxin to 1000 mg every 6 hours as needed spasms. ? ? ?DVT prophylaxis: Lovenox ?Code Status: Full ?Family Communication:  ?Disposition Plan: ?Status is: inpatient because: Of severity of illness.  Possible discharge in 1 to 2 days if cleared by GI and tolerating diet. ? ?Consultants: GI ? ?Procedures: EGD on 02/02/2022 and  02/04/2022 ? ?Antimicrobials: None ? ? ?Subjective: ?Patient seen and examined at bedside.  Does not feel well.  Still complains of spasms in her extremities.  Had vomiting yesterday after trying for liquid diet.  Has not tried any food this morning.  Still has intermittent nausea.  No chest pain, worsening shortness breath, fever reported.   ?Objective: ?Vitals:  ? 02/04/22 2325 02/05/22 0132 02/05/22 5621 02/05/22 3086  ?BP: 106/78 112/87 119/79   ?Pulse: (!) 121 (!) 122 (!) 120   ?Resp: 18 18 18    ?Temp: 98 ?F (36.7 ?C) 98.2 ?F (36.8 ?C) 98 ?F (36.7 ?C)   ?TempSrc: Oral Oral Oral   ?SpO2: 93% 95% 92% (!) 84%  ?Weight:      ?Height:      ? ? ?Intake/Output Summary (Last 24 hours) at 02/05/2022 0921 ?Last data filed at 02/05/2022 5784 ?Gross per 24 hour  ?Intake 2082.91 ml  ?Output --  ?Net 2082.91 ml  ? ? ?Filed Weights  ? 02/02/22 0523 02/02/22 1107 02/03/22 0510  ?Weight: 63.5 kg 63.5 kg 68.1 kg  ? ? ?Examination: ? ?General exam: Currently on room air.  In mild distress secondary to extremity spasm. ?Respiratory system: Bilateral decreased breath sounds at bases with scattered crackles, cardiovascular system: S1-S2 heard; tachycardic  ?gastrointestinal system: Abdomen is mildly distended; soft and nontender.  Normal bowel sounds heard  ?extremities: No cyanosis or clubbing or edema ? ? ? ?Data Reviewed: I have personally reviewed following labs and imaging studies ? ?CBC: ?Recent Labs  ?Lab 02/02/22 ?6962 02/03/22 ?0604 02/04/22 ?0421 02/05/22 ?9528  ?WBC 6.9 10.5 7.4 8.4  ?NEUTROABS 4.8  --  5.4 6.5  ?HGB 12.3 9.6* 8.8* 8.8*  ?HCT 36.4 28.8* 27.1* 28.0*  ?MCV  101.4* 103.2* 107.5* 107.7*  ?PLT 221 169 142* 141*  ? ? ?Basic Metabolic Panel: ?Recent Labs  ?Lab 02/02/22 ?5809 02/03/22 ?0604 02/04/22 ?0421 02/05/22 ?9833  ?NA 139 135 136 134*  ?K 3.2* 4.1 4.5 4.0  ?CL 108 108 112* 106  ?CO2 21* 20* 18* 21*  ?GLUCOSE 95 117* 83 83  ?BUN 10 8 <5* 6  ?CREATININE 0.89 0.81 0.67 0.78  ?CALCIUM 9.8 9.1 8.8* 8.7*  ?MG 1.7   --  1.7 1.4*  ?PHOS 3.0  --   --   --   ? ? ?GFR: ?Estimated Creatinine Clearance: 84.9 mL/min (by C-G formula based on SCr of 0.78 mg/dL). ?Liver Function Tests: ?Recent Labs  ?Lab 02/02/22 ?8250  ?AST 16  ?ALT 13  ?ALKPHOS 77  ?BILITOT 0.5  ?PROT 6.8  ?ALBUMIN 3.5  ? ? ?No results for input(s): LIPASE, AMYLASE in the last 168 hours. ?No results for input(s): AMMONIA in the last 168 hours. ?Coagulation Profile: ?No results for input(s): INR, PROTIME in the last 168 hours. ?Cardiac Enzymes: ?No results for input(s): CKTOTAL, CKMB, CKMBINDEX, TROPONINI in the last 168 hours. ?BNP (last 3 results) ?No results for input(s): PROBNP in the last 8760 hours. ?HbA1C: ?No results for input(s): HGBA1C in the last 72 hours. ?CBG: ?No results for input(s): GLUCAP in the last 168 hours. ?Lipid Profile: ?No results for input(s): CHOL, HDL, LDLCALC, TRIG, CHOLHDL, LDLDIRECT in the last 72 hours. ?Thyroid Function Tests: ?No results for input(s): TSH, T4TOTAL, FREET4, T3FREE, THYROIDAB in the last 72 hours. ?Anemia Panel: ?Recent Labs  ?  02/03/22 ?0604  ?VITAMINB12 448  ? ? ?Sepsis Labs: ?No results for input(s): PROCALCITON, LATICACIDVEN in the last 168 hours. ? ?Recent Results (from the past 240 hour(s))  ?Resp Panel by RT-PCR (Flu A&B, Covid) Nasopharyngeal Swab     Status: None  ? Collection Time: 02/02/22  6:13 AM  ? Specimen: Nasopharyngeal Swab; Nasopharyngeal(NP) swabs in vial transport medium  ?Result Value Ref Range Status  ? SARS Coronavirus 2 by RT PCR NEGATIVE NEGATIVE Final  ?  Comment: (NOTE) ?SARS-CoV-2 target nucleic acids are NOT DETECTED. ? ?The SARS-CoV-2 RNA is generally detectable in upper respiratory ?specimens during the acute phase of infection. The lowest ?concentration of SARS-CoV-2 viral copies this assay can detect is ?138 copies/mL. A negative result does not preclude SARS-Cov-2 ?infection and should not be used as the sole basis for treatment or ?other patient management decisions. A negative result  may occur with  ?improper specimen collection/handling, submission of specimen other ?than nasopharyngeal swab, presence of viral mutation(s) within the ?areas targeted by this assay, and inadequate number of viral ?copies(<138 copies/mL). A negative result must be combined with ?clinical observations, patient history, and epidemiological ?information. The expected result is Negative. ? ?Fact Sheet for Patients:  ?EntrepreneurPulse.com.au ? ?Fact Sheet for Healthcare Providers:  ?IncredibleEmployment.be ? ?This test is no t yet approved or cleared by the [place] FDA and  ?has been authorized for detection and/or diagnosis of SARS-CoV-2 by ?FDA under an Emergency Use Authorization (EUA). This EUA will remain  ?in effect (meaning this test can be used) for the duration of the ?COVID-19 declaration under Section 564(b)(1) of the Act, 21 ?U.S.C.section 360bbb-3(b)(1), unless the authorization is terminated  ?or revoked sooner.  ? ? ?  ? Influenza A by PCR NEGATIVE NEGATIVE Final  ? Influenza B by PCR NEGATIVE NEGATIVE Final  ?  Comment: (NOTE) ?The Xpert Xpress SARS-CoV-2/FLU/RSV plus assay is intended as an aid ?in  the diagnosis of influenza from Nasopharyngeal swab specimens and ?should not be used as a sole basis for treatment. Nasal washings and ?aspirates are unacceptable for Xpert Xpress SARS-CoV-2/FLU/RSV ?testing. ? ?Fact Sheet for Patients: ?EntrepreneurPulse.com.au ? ?Fact Sheet for Healthcare Providers: ?IncredibleEmployment.be ? ?This test is not yet approved or cleared by the [place] FDA and ?has been authorized for detection and/or diagnosis of SARS-CoV-2 by ?FDA under an Emergency Use Authorization (EUA). This EUA will remain ?in effect (meaning this test can be used) for the duration of the ?COVID-19 declaration under Section 564(b)(1) of the Act, 21 U.S.C. ?section 360bbb-3(b)(1), unless the authorization is terminated  or ?revoked. ? ?Performed at [facility], [address] ?[place], [place] [address] ?  ? ?  ? ? ? ? ? ?Radiology Studies: ?No results found. ? ? ? ? ? ?Scheduled Meds: ? enoxaparin (LOVENOX) injection

## 2022-02-05 NOTE — Progress Notes (Signed)
[facility] Progress Note ? ?[name] 51 y.o. 1970/12/20 ? ?CC: Dysphagia, esophageal stricture ? ? ?Subjective: ?Patient seen and examined at bedside.  Complaining of intermittent nausea and vomiting.  Was able to keep down soft food today. ? ?ROS : Positive for nausea and vomiting.  Negative for abdominal pain ? ? ?Objective: ?Vital signs in last 24 hours: ?Vitals:  ? 02/05/22 1030 02/05/22 1325  ?BP: 107/77 122/76  ?Pulse: (!) 120 (!) 116  ?Resp: 18 18  ?Temp: 98 ?F (36.7 ?C) 97.9 ?F (36.6 ?C)  ?SpO2: 91% 95%  ? ? ?Physical Exam: ? ?General:  Alert, cooperative, no distress, appears stated age  ?Head:  Normocephalic, without obvious abnormality, atraumatic  ?Eyes:  , EOM's intact,   ?Lungs:   Clear to auscultation bilaterally, respirations unlabored  ?Heart:  Regular rate and rhythm, S1, S2 normal  ?Abdomen:   Soft, non-tender, nondistended, bowel sounds present  ?Extremities: Extremities normal, atraumatic, no  edema  ?Pulses: 2+ and symmetric  ? ? ?Lab Results: ?Recent Labs  ?  02/04/22 ?0421 02/05/22 ?9485  ?NA 136 134*  ?K 4.5 4.0  ?CL 112* 106  ?CO2 18* 21*  ?GLUCOSE 83 83  ?BUN <5* 6  ?CREATININE 0.67 0.78  ?CALCIUM 8.8* 8.7*  ?MG 1.7 1.4*  ? ?No results for input(s): AST, ALT, ALKPHOS, BILITOT, PROT, ALBUMIN in the last 72 hours. ?Recent Labs  ?  02/04/22 ?0421 02/05/22 ?0456  ?WBC 7.4 8.4  ?NEUTROABS 5.4 6.5  ?HGB 8.8* 8.8*  ?HCT 27.1* 28.0*  ?MCV 107.5* 107.7*  ?PLT 142* 141*  ? ?No results for input(s): LABPROT, INR in the last 72 hours. ? ? ? ?Assessment/Plan: ?-Dysphagia from mid esophageal stricture.  Most likely radiation-induced. ? ?Recommendation ?----------------------- ?-Continue soft diet. ?-Continue twice daily PPI and twice daily Carafate ?-May benefit from repeat EGD in 4 to 8 weeks if continues to have trouble swallowing. ?-Okay to resume Eliquis ?-No further inpatient GI work-up planned.  GI will sign off.  Call us back if needed ? ? ?Otis Brace MD, FACP ?02/05/2022,  1:30 PM ? ?Contact #  [phone]  ?

## 2022-02-05 NOTE — Progress Notes (Signed)
ANTICOAGULATION CONSULT NOTE  ? ?Pharmacy Consult for Enoxaparin > Apixaban ?Indication: hx of VTE  ? ?Allergies  ?Allergen Reactions  ? Naproxen Nausea Only  ? Bactrim [Sulfamethoxazole-Trimethoprim] Nausea Only  ? ? ?Patient Measurements: ?Height: 5\' 8"  (172.7 cm) ?Weight: 68.1 kg (150 lb 2.1 oz) ?IBW/kg (Calculated) : 63.9 ? ? ?Vital Signs: ?Temp: 97.9 ?F (36.6 ?C) (03/14 1325) ?Temp Source: Oral (03/14 1325) ?BP: 122/76 (03/14 1325) ?Pulse Rate: 116 (03/14 1325) ? ?Labs: ?Recent Labs  ?  02/03/22 ?0604 02/04/22 ?0421 02/05/22 ?8786  ?HGB 9.6* 8.8* 8.8*  ?HCT 28.8* 27.1* 28.0*  ?PLT 169 142* 141*  ?CREATININE 0.81 0.67 0.78  ? ? ? ?Estimated Creatinine Clearance: 84.9 mL/min (by C-G formula based on SCr of 0.78 mg/dL). ? ? ?Medical History: ?Past Medical History:  ?Diagnosis Date  ? Ankle sprain   ? left  ? Anxiety   ? treated for panic attacks in the past.  ? Asthma   ? COPD (chronic obstructive pulmonary disease) ([place])   ? Depression   ? History of radiation therapy   ? Right lung- 09/26/21-11/13/21- Dr. Gery Pray  ? Irregular heart rate   ? Lung cancer ([place])   ? Renal disorder   ? kidney infections  ? ? ?Assessment: ?60 yoF admitted on 3/11 with radiation-induced esophageal stricture and esophagitis. She was on apixaban PTA for history of DVT of RUE associated with previous PICC line. Patient initially placed on treatment dose Enoxaparin since patient unable to take PO medications due to difficulty swallowing, n/v. Patient underwent EGD on 3/11 and again on 3/13. 3/13 EGD showed esophagitis along with esophageal stenosis with mucosal tear, hence dilation not performed. Pharmacy consulted today to resume Apixaban. Last dose of Enoxaparin 60mg  given today at 1031. CBC: Hgb 8.8, low but stable. Pltc slightly low at 141K. SCr WNL, 0.78.  ?  ?Plan:  ?Discontinue Enoxaparin  ?Resume Apixaban 5mg  PO BID at 2200 tonight  ?Monitor CBC, s/sx of bleeding, ability to tolerate PO meds ?  ? ?Lindell Spar, PharmD,  BCPS ?Clinical Pharmacist ?02/05/2022 1:46 PM ? ? ?

## 2022-02-06 DIAGNOSIS — R1319 Other dysphagia: Secondary | ICD-10-CM

## 2022-02-06 DIAGNOSIS — D539 Nutritional anemia, unspecified: Secondary | ICD-10-CM

## 2022-02-06 DIAGNOSIS — R42 Dizziness and giddiness: Secondary | ICD-10-CM

## 2022-02-06 LAB — BASIC METABOLIC PANEL
Anion gap: 6 (ref 5–15)
BUN: 6 mg/dL (ref 6–20)
CO2: 20 mmol/L — ABNORMAL LOW (ref 22–32)
Calcium: 8.5 mg/dL — ABNORMAL LOW (ref 8.9–10.3)
Chloride: 108 mmol/L (ref 98–111)
Creatinine, Ser: 0.66 mg/dL (ref 0.44–1.00)
GFR, Estimated: >60 mL/min (ref >60)
Glucose, Bld: 77 mg/dL (ref 70–99)
Potassium: 3.5 mmol/L (ref 3.5–5.1)
Sodium: 134 mmol/L — ABNORMAL LOW (ref 135–145)

## 2022-02-06 LAB — CBC WITH DIFFERENTIAL/PLATELET
Abs Immature Granulocytes: 0.03 10*3/uL (ref 0.00–0.07)
Basophils Absolute: 0 10*3/uL (ref 0.0–0.1)
Basophils Relative: 0 %
Eosinophils Absolute: 0.1 10*3/uL (ref 0.0–0.5)
Eosinophils Relative: 1 %
HCT: 25.7 % — ABNORMAL LOW (ref 36.0–46.0)
Hemoglobin: 8.3 g/dL — ABNORMAL LOW (ref 12.0–15.0)
Immature Granulocytes: 1 %
Lymphocytes Relative: 11 %
Lymphs Abs: 0.7 10*3/uL (ref 0.7–4.0)
MCH: 34 pg (ref 26.0–34.0)
MCHC: 32.3 g/dL (ref 30.0–36.0)
MCV: 105.3 fL — ABNORMAL HIGH (ref 80.0–100.0)
Monocytes Absolute: 0.8 10*3/uL (ref 0.1–1.0)
Monocytes Relative: 12 %
Neutro Abs: 5 10*3/uL (ref 1.7–7.7)
Neutrophils Relative %: 75 %
Platelets: 147 10*3/uL — ABNORMAL LOW (ref 150–400)
RBC: 2.44 MIL/uL — ABNORMAL LOW (ref 3.87–5.11)
RDW: 19.3 % — ABNORMAL HIGH (ref 11.5–15.5)
WBC: 6.6 10*3/uL (ref 4.0–10.5)
nRBC: 0 % (ref 0.0–0.2)

## 2022-02-06 LAB — MAGNESIUM: Magnesium: 1.6 mg/dL — ABNORMAL LOW (ref 1.7–2.4)

## 2022-02-06 MED ORDER — METOPROLOL TARTRATE 25 MG PO TABS
12.5000 mg | ORAL_TABLET | Freq: Two times a day (BID) | ORAL | Status: DC
  Administered 2022-02-06 (×2): 12.5 mg via ORAL
  Filled 2022-02-06 (×2): qty 1

## 2022-02-06 MED ORDER — METHOCARBAMOL 1000 MG/10ML IJ SOLN
500.0000 mg | Freq: Four times a day (QID) | INTRAVENOUS | Status: DC | PRN
  Administered 2022-02-06: 500 mg via INTRAVENOUS
  Filled 2022-02-06 (×3): qty 5

## 2022-02-06 MED ORDER — MAGNESIUM SULFATE 2 GM/50ML IV SOLN
2.0000 g | Freq: Once | INTRAVENOUS | Status: AC
  Administered 2022-02-06: 2 g via INTRAVENOUS
  Filled 2022-02-06: qty 50

## 2022-02-06 MED ORDER — GUAIFENESIN-DM 100-10 MG/5ML PO SYRP
5.0000 mL | ORAL_SOLUTION | ORAL | Status: DC | PRN
  Administered 2022-02-06 (×2): 5 mL via ORAL
  Filled 2022-02-06 (×2): qty 10

## 2022-02-06 MED ORDER — HYDROCODONE BIT-HOMATROP MBR 5-1.5 MG/5ML PO SOLN
5.0000 mL | Freq: Four times a day (QID) | ORAL | Status: DC | PRN
  Administered 2022-02-06 – 2022-02-08 (×5): 5 mL via ORAL
  Filled 2022-02-06 (×5): qty 5

## 2022-02-06 NOTE — Progress Notes (Signed)
?   02/05/22 2133  ?Assess: MEWS Score  ?Temp 98.1 ?F (36.7 ?C)  ?BP 111/79  ?Pulse Rate (!) 129  ?Resp 18  ?SpO2 92 %  ?O2 Device Room Air;Nasal Cannula  ?O2 Flow Rate (L/min) 2 L/min  ?Assess: MEWS Score  ?MEWS Temp 0  ?MEWS Systolic 0  ?MEWS Pulse 2  ?MEWS RR 0  ?MEWS LOC 0  ?MEWS Score 2  ?MEWS Score Color Yellow  ?Assess: if the MEWS score is Yellow or Red  ?Were vital signs taken at a resting state? Yes  ?Focused Assessment No change from prior assessment  ?Does the patient meet 2 or more of the SIRS criteria? No  ?MEWS guidelines implemented *See Row Information* No, previously yellow, continue vital signs every 4 hours  ?Treat  ?Pain Scale 0-10  ?Pain Score 10  ?Pain Location Chest  ?Pain Descriptors / Indicators Aching;Discomfort  ?Pain Frequency Constant  ?Pain Intervention(s) Medication (See eMAR)  ?Take Vital Signs  ?Increase Vital Sign Frequency   ?(no, previously yellow)  ?Escalate  ?MEWS: Escalate Yellow: discuss with charge nurse/RN and consider discussing with provider and RRT  ?Notify: Charge Nurse/RN  ?Name of Charge Nurse/RN Notified Colletta Maryland RN  ?Date Charge Nurse/RN Notified 02/06/22  ?Time Charge Nurse/RN Notified 2135  ?Document  ?Patient Outcome Stabilized after interventions  ?Progress note created (see row info) Yes  ?Assess: SIRS CRITERIA  ?SIRS Temperature  0  ?SIRS Pulse 1  ?SIRS Respirations  0  ?SIRS WBC 0  ?SIRS Score Sum  1  ? ?J. Olena Heckle, NP notified. New meds ordered.  ?

## 2022-02-06 NOTE — Assessment & Plan Note (Addendum)
Resolved

## 2022-02-06 NOTE — Evaluation (Signed)
Physical Therapy Evaluation ?Patient Details ?Name: [name] ?MRN: [id number] ?DOB: July 09, 1971 ?Today's Date: 02/06/2022 ? ?History of Present Illness ? 51 y.o. 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  She underwent EGD on 02/02/2022 and again on 02/04/2022 which showed esophagitis along with esophageal stenosis with mucosal tear, hence dilation was not performed  ?Clinical Impression ? The patient reports having a HA, patient ambulated x 100', occassional use of rail. Spo2  on RA lowest 89%  and HR up to 124. Patient with intermittent dizziness. Patient has family support. No further PT needs at this time. PT will sign off ? ?   ? ?Recommendations for follow up therapy are one component of a multi-disciplinary discharge planning process, led by the attending physician.  Recommendations may be updated based on patient status, additional functional criteria and insurance authorization. ? ?Follow Up Recommendations No PT follow up ? ?  ?Assistance Recommended at Discharge None  ?Patient can return home with the following ? Assist for transportation ? ?  ?Equipment Recommendations    ?Recommendations for Other Services ?    ?  ?Functional Status Assessment Patient has had a recent decline in their functional status and demonstrates the ability to make significant improvements in function in a reasonable and predictable amount of time.  ? ?  ?Precautions / Restrictions Precautions ?Precautions: Fall ?Precaution Comments: monitor HR/Sats  ? ?  ? ?Mobility ? Bed Mobility ?Overal bed mobility: Independent ?  ?  ?  ?  ?  ?  ?  ?  ? ?Transfers ?Overall transfer level: Independent ?  ?  ?  ?  ?  ?  ?  ?  ?  ?  ? ?Ambulation/Gait ?Ambulation/Gait assistance: Supervision ?Gait Distance  (Feet): 100 Feet ?Assistive device:  (held rail at times) ?Gait Pattern/deviations: Step-through pattern, Drifts right/left ?  ?  ?  ?  ? ?Stairs ?  ?  ?  ?  ?  ? ?Wheelchair Mobility ?  ? ?Modified Rankin (Stroke Patients Only) ?  ? ?  ? ?Balance Overall balance assessment: Mild deficits observed, not formally tested ?  ?  ?  ?  ?  ?  ?  ?  ?  ?  ?  ?  ?  ?  ?  ?  ?  ?  ?   ? ? ? ?Pertinent Vitals/Pain Pain Assessment ?Pain Assessment: Faces ?Faces Pain Scale: Hurts little more ?Pain Location: HA ?Pain Descriptors / Indicators: Aching ?Pain Intervention(s): Monitored during session  ? ? ?Home Living Family/patient expects to be discharged to:: Private residence ?Living Arrangements: Parent;Children ?Available Help at Discharge: Family ?Type of Home: House ?Home Access: Stairs to enter ?  ?Entrance Stairs-Number of Steps: 3 on the side, 1 in the front ?  ?Home Layout: One level ?Home Equipment: None ?   ?  ?Prior Function Prior Level of Function : Independent/Modified Independent ?  ?  ?  ?  ?  ?  ?Mobility Comments: independent ?ADLs Comments: independent - gets down in tub to bathe ?  ? ? ?Hand Dominance  ? Dominant Hand: Left ? ?  ?Extremity/Trunk Assessment  ? Upper Extremity Assessment ?Upper Extremity Assessment: Defer to OT evaluation ?  ? ?Lower Extremity Assessment ?Lower Extremity Assessment: Overall WFL for tasks assessed ?  ? ?Cervical / Trunk Assessment ?  Cervical / Trunk Assessment: Normal  ?Communication  ? Communication: No difficulties  ?Cognition Arousal/Alertness: Awake/alert ?Behavior During Therapy: [facility] for tasks assessed/performed ?Overall Cognitive Status: Within Functional Limits for tasks assessed ?  ?  ?  ?  ?  ?  ?  ?  ?  ?  ?  ?  ?  ?  ?  ?  ?  ?  ?  ? ?  ?General Comments   ? ?  ?Exercises    ? ?Assessment/Plan  ?  ?PT Assessment Patient does not need any further PT services  ?PT Problem List   ? ?   ?  ?PT Treatment Interventions     ? ?PT Goals (Current goals can be found in the Care  Plan section)  ?Acute Rehab PT Goals ?PT Goal Formulation: All assessment and education complete, DC therapy ? ?  ?Frequency   ?  ? ? ?Co-evaluation   ?  ?  ?  ?  ? ? ?  ?AM-PAC PT "6 Clicks" Mobility  ?Outcome Measure Help needed turning from your back to your side while in a flat bed without using bedrails?: None ?Help needed moving from lying on your back to sitting on the side of a flat bed without using bedrails?: None ?Help needed moving to and from a bed to a chair (including a wheelchair)?: None ?Help needed standing up from a chair using your arms (e.g., wheelchair or bedside chair)?: None ?Help needed to walk in hospital room?: A Little ?Help needed climbing 3-5 steps with a railing? : A Little ?6 Click Score: 22 ? ?  ?End of Session   ?Activity Tolerance: Patient limited by fatigue ?Patient left: in bed;with call bell/phone within reach ?Nurse Communication: Mobility status ?PT Visit Diagnosis: Unsteadiness on feet (R26.81) ?  ? ?Time: 1520-1540 ?PT Time Calculation (min) (ACUTE ONLY): 20 min ? ? ?Charges:   PT Evaluation ?$PT Eval Low Complexity: 1 Low ?  ?  ?   ? ? ?[name] PT ?Acute Rehabilitation Services ?Pager [phone] ?Office [phone] ? ? ?[name], [name] ?02/06/2022, 4:18 PM ? ?

## 2022-02-06 NOTE — TOC Initial Note (Signed)
Transition of Care (TOC) - Initial/Assessment Note  ? ? ?Patient Details  ?Name: [name] ?MRN: [id number] ?Date of Birth: [birth year] ? ?Transition of Care (TOC) CM/SW Contact:    ?Tawanna Cooler, RN ?Phone Number: ?02/06/2022, 1:57 PM ? ?Clinical Narrative:                 ? ? ?Transition of Care (TOC) Screening Note ? ? ?Patient Details  ?Name: [name] ?Date of Birth: 1971-01-03 ? ? ?Transition of Care (TOC) CM/SW Contact:    ?Tawanna Cooler, RN ?Phone Number: ?02/06/2022, 1:58 PM ? ? ? ?Transition of Care Department [facility]) has reviewed patient and no TOC needs have been identified at this time. We will continue to monitor patient advancement through interdisciplinary progression rounds. If new patient transition needs arise, please place a TOC consult. ? ? ? ? ?Expected Discharge Plan: Home/Self Care ?Barriers to Discharge: Continued Medical Work up ? ? ? ?Expected Discharge Plan and Services ?Expected Discharge Plan: Home/Self Care ?  ?  ?Prior Living Arrangements/Services ?  ?Lives with:: Adult Children, Parents ?Patient language and need for interpreter reviewed:: Yes ?Do you feel safe going back to the place where you live?: Yes      ?Need for Family Participation in Patient Care: Yes (Comment) ?Care giver support system in place?: Yes (comment) ?  ?Criminal Activity/Legal Involvement Pertinent to Current Situation/Hospitalization: No - Comment as needed ? ?Activities of Daily Living ?Home Assistive Devices/Equipment: None ?ADL Screening (condition at time of admission) ?Patient's cognitive ability adequate to safely complete daily activities?: Yes ?Is the patient deaf or have difficulty hearing?: No ?Does the patient have difficulty seeing, even when wearing glasses/contacts?: No ?Does the patient have difficulty concentrating, remembering, or making decisions?: No ?Patient able to express need for assistance with ADLs?: Yes ?Does the patient have difficulty dressing or bathing?:  No ?Independently performs ADLs?: Yes (appropriate for developmental age) ?Does the patient have difficulty walking or climbing stairs?: Yes ?Weakness of Legs: Both ?Weakness of Arms/Hands: Both ? ?Alcohol / Substance Use: Not Applicable ?Psych Involvement: No (comment) ? ?Admission diagnosis:  Dehydration [E86.0] ?Hypokalemia [E87.6] ?Bronchospasm [J98.01] ?Esophageal obstruction [K22.2] ?Radiation esophagitis [K20.80, T66.XXXA] ?Dysphagia [R13.10] ?Patient Active Problem List  ? Diagnosis Date Noted  ? Dysphagia 02/02/2022  ? Macrocytosis 02/02/2022  ? Encounter for antineoplastic chemotherapy 01/18/2022  ? Esophageal stricture 12/25/2021  ? Insomnia 12/24/2021  ? DVT of upper extremity (deep vein thrombosis) ([place]) 12/20/2021  ? Hypophosphatemia 12/19/2021  ? Electrolyte abnormality 12/19/2021  ? Cancer related pain 12/19/2021  ? Tobacco abuse 12/19/2021  ? Sinus tachycardia 12/19/2021  ? Neutropenic fever ([place]) 12/18/2021  ? Acute on chronic respiratory failure with hypoxia ([place]) 12/18/2021  ? Pneumonia 12/18/2021  ? Acute respiratory failure ([place]) 12/18/2021  ? Aspiration pneumonia ([place]) 12/18/2021  ? Radiation-induced esophagitis 12/18/2021  ? Chemotherapy induced nausea and vomiting 12/14/2021  ? Chest pain 12/14/2021  ? Hypomagnesemia 12/14/2021  ? Hypokalemia 12/14/2021  ? Hyponatremia 12/14/2021  ? Hyperglycemia 12/14/2021  ? Leukopenia due to antineoplastic chemotherapy ([place]) 12/14/2021  ? Thrombocytopenia ([place]) 12/14/2021  ? Normocytic anemia 12/14/2021  ? AKI (acute kidney injury) ([place]) 12/14/2021  ? Generalized weakness 12/14/2021  ? Dehydration 11/13/2021  ? Goals of care, counseling/discussion 09/28/2021  ? Malignant neoplasm of right upper lobe of lung ([place]) 09/05/2021  ? Status post lumbar microdiscectomy 06/24/2018  ? Left lumbar radiculitis 04/25/2017  ? Lumbar degenerative disc disease 04/25/2017  ? ?PCP:  [name], PA-C ?Pharmacy:   ?  [facility] ?[address] ?[place] [address] ?Phone: [phone] Fax: [phone] ? ? ? ? ?Social Determinants of Health (SDOH) Interventions ?  ? ?Readmission Risk Interventions ?Readmission Risk Prevention Plan 02/06/2022 12/17/2021  ?Transportation Screening Complete Complete  ?[place] or Home Care Consult - Complete  ?Social Work Consult for [place] Planning/Counseling - Complete  ?Palliative Care Screening - Not Applicable  ?Medication Review Press photographer) Complete Complete  ?PCP or Specialist appointment within 3-5 days of discharge Complete -  ?[place] or Home Care Consult Complete -  ?SW Recovery Care/Counseling Consult Complete -  ?Palliative Care Screening Complete -  ?Skilled Nursing Facility Complete -  ?Some recent data might be hidden  ? ? ? ?

## 2022-02-06 NOTE — Evaluation (Signed)
Occupational Therapy Evaluation ?Patient Details ?Name: [name] ?MRN: [id number] ?DOB: February 10, 1971 ?Today's Date: 02/06/2022 ? ? ?History of Present Illness 51 y.o. 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  She underwent EGD on 02/02/2022 and again on 02/04/2022 which showed esophagitis along with esophageal stenosis with mucosal tear, hence dilation was not performed  ? ?Clinical Impression ?  ?[name] is a 51 year old woman who presents with generalized weakness and decreased activity tolerance. On evaluation she demonstrates ability to perform ADLs and functional mobility. She reports dizziness with standing and ambulation and uses hand holds for steadying - she reports this has been her recent baseline. With limited ambulation (approx 14 feet) her o2 sat dropped to 84% and HR up to 149. She reports limited nutrition due to dysphagia and esophageal stenosis. In discussing needs for therapy patient reports she would prefer to focus on improving nutrition prior to any formal therapy. Therapist recommended use of shower chair for safety at home as patient has been getting down in the tub. Patient reports no needs or concerns at this time. OT will sign off. May benefit from OP OT in the future once nutrition has improved.   ?   ? ?Recommendations for follow up therapy are one component of a multi-disciplinary discharge planning process, led by the attending physician.  Recommendations may be updated based on patient status, additional functional criteria and insurance authorization.  ? ?Follow Up Recommendations ? No OT follow up  ?  ?Assistance Recommended at Discharge PRN  ?Patient can return home with the following Assistance with cooking/housework ? ?  ?Functional  Status Assessment ? Patient has had a recent decline in their functional status and demonstrates the ability to make significant improvements in function in a reasonable and predictable amount of time.  ?Equipment Recommendations ? Tub/shower seat  ?  ?Recommendations for Other Services   ? ? ?  ?Precautions / Restrictions Precautions ?Precautions: Fall ?Precaution Comments: dizziness with standing, ambulation (at baseline) ?Restrictions ?Weight Bearing Restrictions: No  ? ?  ? ?   ?Balance Overall balance assessment: Mild deficits observed, not formally tested ?  ?  ?  ?  ?  ?  ?  ?  ?  ?  ?  ?  ?  ?  ?  ?  ?  ?  ?   ? ?ADL either performed or assessed with clinical judgement  ? ?ADL Overall ADL's : Modified independent ?  ?  ?  ?  ?  ?  ?  ?  ?  ?  ?  ?  ?  ?  ?  ?  ?  ?  ?  ?General ADL Comments: Increased time and need for intermittent hand hold for balance but otherwise independent  ? ? ? ?Vision Patient Visual Report: No change from baseline ?   ?   ?   ?   ? ?Pertinent Vitals/Pain Pain Assessment ?Pain Assessment: No/denies pain  ? ? ? ?Hand Dominance Left ?  ?Extremity/Trunk Assessment Upper Extremity Assessment ?Upper Extremity Assessment: RUE deficits/detail;LUE deficits/detail ?RUE Deficits / Details: ROM WNL, grossly 4-/5 strength, except for grip 4/5 ?RUE Sensation: WNL ?RUE Coordination: WNL ?LUE Deficits / Details: ROM WNL, grossly 4-/5 strength, except for grip 4/5 ?LUE Sensation: WNL ?LUE Coordination: WNL ?  ?Lower Extremity  Assessment ?Lower Extremity Assessment: Defer to PT evaluation ?  ?Cervical / Trunk Assessment ?Cervical / Trunk Assessment: Normal ?  ?Communication Communication ?Communication: No difficulties ?  ?Cognition Arousal/Alertness: Awake/alert ?Behavior During Therapy: [facility] for tasks assessed/performed ?Overall Cognitive Status: Within Functional Limits for tasks assessed ?  ?  ?  ?  ?  ?  ?  ?  ?  ?  ?  ?  ?  ?  ?  ?  ?  ?  ?  ?   ?   ?   ? ? ?Home Living Family/patient expects to  be discharged to:: Private residence ?Living Arrangements: Parent;Children ?Available Help at Discharge: Family ?Type of Home: House ?Home Access: Stairs to enter ?Entrance Stairs-Number of Steps: 3 on the side, 1 in the front ?  ?Home Layout: One level ?  ?  ?Bathroom Shower/Tub: Tub/shower unit ?  ?Bathroom Toilet: Standard ?  ?  ?Home Equipment: None ?  ?  ?  ? ?  ?Prior Functioning/Environment Prior Level of Function : Independent/Modified Independent ?  ?  ?  ?  ?  ?  ?Mobility Comments: independent ?ADLs Comments: independent - gets down in tub to bathe ?  ? ?  ?  ?OT Problem List: Decreased strength;Decreased activity tolerance;Decreased knowledge of use of DME or AE;Cardiopulmonary status limiting activity ?  ?   ?OT Treatment/Interventions:    ?  ?OT Goals(Current goals can be found in the care plan section) Acute Rehab OT Goals ?OT Goal Formulation: All assessment and education complete, DC therapy  ?OT Frequency:   ?  ? ?Co-evaluation   ?  ?  ?  ?  ? ?  ?AM-PAC OT "6 Clicks" Daily Activity     ?Outcome Measure Help from another person eating meals?: None ?Help from another person taking care of personal grooming?: None ?Help from another person toileting, which includes using toliet, bedpan, or urinal?: None ?Help from another person bathing (including washing, rinsing, drying)?: None ?Help from another person to put on and taking off regular upper body clothing?: None ?Help from another person to put on and taking off regular lower body clothing?: None ?6 Click Score: 24 ?  ?End of Session Nurse Communication: Mobility status ? ?Activity Tolerance: Patient tolerated treatment well ?Patient left: in bed ? ?OT Visit Diagnosis: Muscle weakness (generalized) (M62.81)  ?              ?Time: 2863-8177 ?OT Time Calculation (min): 17 min ?Charges:  OT General Charges ?$OT Visit: 1 Visit ?OT Evaluation ?$OT Eval Low Complexity: 1 Low ? ?Rilya Longo, OTR/L ?Acute Care Rehab Services  ?Office [phone] ?Pager:  [phone]  ? ?Tache Bobst L Jaide Hillenburg ?02/06/2022, 9:35 AM ?

## 2022-02-06 NOTE — Assessment & Plan Note (Signed)
See dysphagia. ?

## 2022-02-06 NOTE — Assessment & Plan Note (Addendum)
TSH within normal.  Improved with p.o. metoprolol. ?-Discharged on p.o. metoprolol 25 mg twice daily ?

## 2022-02-06 NOTE — Hospital Course (Addendum)
51 y.o. 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  GI was consulted.  She underwent EGD on 02/02/2022 and again on 02/04/2022 that showed esophagitis along with esophageal stenosis with mucosal tear, hence dilation was not performed.  Continued on home Protonix and Carafate.  Eventually, symptoms improved.  She tolerated soft diet.  Cleared for discharge by gastroenterology. ? ?Hospital course complicated by acute hypoxic respiratory failure in the setting of COPD exacerbation.  Started on systemic steroid and nebulizers with improvement in her breathing.  Discharge on Spiriva and albuterol as needed. ?

## 2022-02-06 NOTE — Assessment & Plan Note (Addendum)
S/p EGD on on 3/11 and 3/13.  EGD revealed esophagitis along with esophageal stenosis with mucosal tear, hence dilation was not performed.  Dysphagia improved.  GI signed off. ?-Continue PPI and Carafate ?-Patient to continue soft diet ?

## 2022-02-06 NOTE — Assessment & Plan Note (Addendum)
Unclear etiology of this.  Orthostatic vitals negative.  Resolved after stopping Robaxin. ?

## 2022-02-06 NOTE — Progress Notes (Signed)
?PROGRESS NOTE ? ?[name] WEX:[id number] DOB: 30-Jun-1971  ? ?PCP: [name], PA-C ? ?Patient is from: Home ? ?DOA: 02/02/2022 LOS: 3 ? ?Chief complaints ?Chief Complaint  ?Patient presents with  ? Esophagitis  ?  ? ?Brief Narrative / Interim history: ?51 y.o. 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  GI was consulted.  She underwent EGD on 02/02/2022 and again on 02/04/2022 that showed esophagitis along with esophageal stenosis with mucosal tear, hence dilation was not performed.  GI signed off.  ? ?Subjective: ?Seen and examined earlier this morning.  No major events overnight of this morning.  Reports improvement in her dysphagia.  She says she felt lightheaded walking in the hallway with therapy.  She denies palpitation or chest pain.  She denies melena or hematochezia but has not had a bowel movement yet. ? ?Objective: ?Vitals:  ? 02/06/22 0518 02/06/22 0827 02/06/22 1006 02/06/22 1213  ?BP: 116/72   108/74  ?Pulse: (!) 119  (!) 122 (!) 101  ?Resp: 18   16  ?Temp: 98.7 ?F (37.1 ?C)   98.4 ?F (36.9 ?C)  ?TempSrc: Oral   Oral  ?SpO2: 90% 94%  92%  ?Weight:      ?Height:      ? ? ?Examination: ? ?GENERAL: No apparent distress.  Nontoxic. ?HEENT: MMM.  Vision and hearing grossly intact.  ?NECK: Supple.  No apparent JVD.  ?RESP:  No IWOB.  Fair aeration bilaterally. ?CVS:  RRR. Heart sounds normal.  ?ABD/GI/GU: BS+. Abd soft, NTND.  ?MSK/EXT:  Moves extremities. No apparent deformity. No edema.  ?SKIN: no apparent skin lesion or wound ?NEURO: Awake, alert and oriented appropriately.  No apparent focal neuro deficit. ?PSYCH: Calm. Normal affect.  ? ?Procedures:  ?3/11 and 3/13-EGD ? ?Microbiology summarized: ?COVID-19 and influenza PCR nonreactive. ? ?Assessment and  Plan: ?* Esophageal dysphagia ?S/p EGD on on 3/11 and 3/13.  EGD revealed esophagitis along with esophageal stenosis with mucosal tear, hence dilation was not performed.  Improving. ?-Continue PPI and Carafate ?-Continue soft diet ?-GI signed off. ? ?Lightheadedness ?She reports feeling lightheaded when she ambulated in the hallway.  No chest pain or dyspnea.  She denies palpitation.  She is a little tachycardic. ?-Orthostatic vitals ?-Decrease Robaxin ? ?Esophageal stricture ?See dysphagia. ? ?Radiation-induced esophagitis ?See dysphagia. ? ?Macrocytic anemia ?Recent Labs  ?  12/26/21 ?0345 12/27/21 ?0428 01/01/22 ?1157 01/07/22 ?9381 01/17/22 ?1049 02/02/22 ?0175 02/03/22 ?0604 02/04/22 ?0421 02/05/22 ?1025 02/06/22 ?8527  ?HGB 9.4* 8.5* 11.4* 12.1 11.1* 12.3 9.6* 8.8* 8.8* 8.3*  ?Patient denies GI bleed.  ?-Check anemia panel ?-Recheck CBC in the morning ? ? ?DVT of upper extremity (deep vein thrombosis) ([place]) ?Continue home Eliquis ? ?Sinus tachycardia ?TSH within normal.  Reports lightheadedness with ambulation.   ?-Start low-dose metoprolol. ? ?Thrombocytopenia ([place]) ?Recent Labs  ?Lab 02/02/22 ?7824 02/03/22 ?0604 02/04/22 ?0421 02/05/22 ?2353 02/06/22 ?6144  ?PLT 221 169 142* 141* 147*  ?Relatively stable.  Continue monitoring ? ? ?Hypokalemia ?K3.5. ?-P.o. K-Dur 40x1 ? ?Hypomagnesemia ?Mg 1.6. ?-IV magnesium sulfate 2 g x 1 ? ? ?Increased nutrient needs ?Body mass index is 22.83 kg/m?Marland Kitchen ?Nutrition Problem: Increased nutrient needs ?Etiology: cancer and cancer related treatments ?Signs/Symptoms: estimated needs ?Interventions: MVI, Magic cup ?  ?DVT prophylaxis:  ? ?apixaban (ELIQUIS) tablet 5 mg  ?Code Status:  Full code ?Family Communication: Patient and/or RN. Available if any question.  ?Level of care: Telemetry ?Status is: Inpatient ?Remains inpatient appropriate because: Due to dysphagia, lightheadedness, hemodynamic instability with tachycardia and electrolyte abnormalities ? ? ?Final disposition:  Home ? ?Consultants:  ?Gastroenterology ? ?Sch Meds:  ?Scheduled Meds: ? apixaban  5 mg Oral BID  ? ipratropium-albuterol  3 mL Nebulization BID  ? metoprolol tartrate  12.5 mg Oral BID  ? multivitamin  15 mL Oral Daily  ? nicotine  14 mg Transdermal Daily  ? ondansetron (ZOFRAN) IV  4 mg Intravenous Q6H  ? pantoprazole (PROTONIX) IV  40 mg Intravenous Q12H  ? sucralfate  1 g Oral TID WC & HS  ? ?Continuous Infusions: ? sodium chloride 75 mL/hr at 02/05/22 0935  ? methocarbamol (ROBAXIN) IV    ? ?PRN Meds:.acetaminophen, glycopyrrolate, guaiFENesin-dextromethorphan, HYDROmorphone (DILAUDID) injection, methocarbamol (ROBAXIN) IV, metoCLOPramide (REGLAN) injection, oxyCODONE-acetaminophen ? ?Antimicrobials: ?Anti-infectives (From admission, onward)  ? ? None  ? ?  ? ? ? ?I have personally reviewed the following labs and images: ?CBC: ?Recent Labs  ?Lab 02/02/22 ?7510 02/03/22 ?0604 02/04/22 ?0421 02/05/22 ?2585 02/06/22 ?2778  ?WBC 6.9 10.5 7.4 8.4 6.6  ?NEUTROABS 4.8  --  5.4 6.5 5.0  ?HGB 12.3 9.6* 8.8* 8.8* 8.3*  ?HCT 36.4 28.8* 27.1* 28.0* 25.7*  ?MCV 101.4* 103.2* 107.5* 107.7* 105.3*  ?PLT 221 169 142* 141* 147*  ? ?BMP &GFR ?Recent Labs  ?Lab 02/02/22 ?2423 02/03/22 ?0604 02/04/22 ?0421 02/05/22 ?5361 02/06/22 ?4431  ?NA 139 135 136 134* 134*  ?K 3.2* 4.1 4.5 4.0 3.5  ?CL 108 108 112* 106 108  ?CO2 21* 20* 18* 21* 20*  ?GLUCOSE 95 117* 83 83 77  ?BUN 10 8 <5* 6 6  ?CREATININE 0.89 0.81 0.67 0.78 0.66  ?CALCIUM 9.8 9.1 8.8* 8.7* 8.5*  ?MG 1.7  --  1.7 1.4* 1.6*  ?PHOS 3.0  --   --   --   --   ? ?Estimated Creatinine Clearance: 84.9 mL/min (by C-G formula based on SCr of 0.66 mg/dL). ?Liver & Pancreas: ?Recent Labs  ?Lab 02/02/22 ?5400  ?AST 16  ?ALT 13  ?ALKPHOS 77  ?BILITOT 0.5  ?PROT 6.8  ?ALBUMIN 3.5  ? ?No results for input(s): LIPASE, AMYLASE in the last 168 hours. ?No results for input(s): AMMONIA in the last 168 hours. ?Diabetic: ?No results for input(s): HGBA1C in the last 72 hours. ?No results for  input(s): GLUCAP in the last 168 hours. ?Cardiac Enzymes: ?No results for input(s): CKTOTAL, CKMB, CKMBINDEX, TROPONINI in the last 168 hours. ?No results for input(s): PROBNP in the last 8760 hours. ?Coagulation Profile: ?No results for input(s): INR, PROTIME in the last 168 hours. ?Thyroid Function Tests: ?No results for input(s): TSH, T4TOTAL, FREET4, T3FREE, THYROIDAB in the last 72 hours. ?Lipid Profile: ?No results for input(s): CHOL, HDL, LDLCALC, TRIG, CHOLHDL, LDLDIRECT in the last 72 hours. ?Anemia Panel: ?No results for input(s): VITAMINB12, FOLATE, FERRITIN, TIBC, IRON, RETICCTPCT in the last 72 hours. ?Urine analysis: ?   ?Component Value Date/Time  ? COLORURINE YELLOW 12/18/2021 0300  ? APPEARANCEUR CLEAR 12/18/2021 0300  ? LABSPEC 1.013 12/18/2021 0300  ? PHURINE 5.0 12/18/2021 0300  ? GLUCOSEU NEGATIVE 12/18/2021 0300  ? HGBUR SMALL (A) 12/18/2021 0300  ? BILIRUBINUR NEGATIVE 12/18/2021 0300  ? KETONESUR 5 (A) 12/18/2021 0300  ? PROTEINUR 30 (A) 12/18/2021 0300  ? UROBILINOGEN 0.2 12/30/2013 1037  ? NITRITE POSITIVE (A) 12/18/2021 0300  ? LEUKOCYTESUR NEGATIVE 12/18/2021 0300  ? ?  Sepsis Labs: ?Invalid input(s): PROCALCITONIN, LACTICIDVEN ? ?Microbiology: ?Recent Results (from the past 240 hour(s))  ?Resp Panel by RT-PCR (Flu A&B, Covid) Nasopharyngeal Swab     Status: None  ? Collection Time: 02/02/22  6:13 AM  ? Specimen: Nasopharyngeal Swab; Nasopharyngeal(NP) swabs in vial transport medium  ?Result Value Ref Range Status  ? SARS Coronavirus 2 by RT PCR NEGATIVE NEGATIVE Final  ?  Comment: (NOTE) ?SARS-CoV-2 target nucleic acids are NOT DETECTED. ? ?The SARS-CoV-2 RNA is generally detectable in upper respiratory ?specimens during the acute phase of infection. The lowest ?concentration of SARS-CoV-2 viral copies this assay can detect is ?138 copies/mL. A negative result does not preclude SARS-Cov-2 ?infection and should not be used as the sole basis for treatment or ?other patient management  decisions. A negative result may occur with  ?improper specimen collection/handling, submission of specimen other ?than nasopharyngeal swab, presence of viral mutation(s) within the ?areas targeted by this assay, and

## 2022-02-06 NOTE — Assessment & Plan Note (Signed)
Continue home Eliquis. 

## 2022-02-06 NOTE — Assessment & Plan Note (Deleted)
Mg 1.6.  K3.5. ?-IV magnesium sulfate 2 g x 1 ?-P.o. K-Dur 40x1 ?

## 2022-02-06 NOTE — Assessment & Plan Note (Addendum)
Recent Labs  ?  01/01/22 ?1157 01/07/22 ?8466 01/17/22 ?1049 02/02/22 ?5993 02/03/22 ?0604 02/04/22 ?0421 02/05/22 ?5701 02/06/22 ?7793 02/07/22 ?0530 02/08/22 ?0515  ?HGB 11.4* 12.1 11.1* 12.3 9.6* 8.8* 8.8* 8.3* 9.1* 8.9*  ?H&H stable after initial drop likely from IV fluid.  Patient denies GI bleed.  Anemia panel with folate deficiency to 3.6 ?-Discharge on p.o. folic acid ?-Recheck CBC at follow-up ? ?

## 2022-02-06 NOTE — Progress Notes (Signed)
Report rec'd from [place], RN, pt is asleep, no distress noted. ST 110s-120s on telemetry. Will continue to monitor pt closely. Kjones RN ?

## 2022-02-06 NOTE — Progress Notes (Signed)
?   02/06/22 0518  ?Assess: MEWS Score  ?Temp 98.7 ?F (37.1 ?C)  ?BP 116/72  ?Pulse Rate (!) 119  ?Resp 18  ?Level of Consciousness Alert  ?SpO2 90 %  ?O2 Device Nasal Cannula  ?Assess: MEWS Score  ?MEWS Temp 0  ?MEWS Systolic 0  ?MEWS Pulse 2  ?MEWS RR 0  ?MEWS LOC 0  ?MEWS Score 2  ?MEWS Score Color Yellow  ?Assess: if the MEWS score is Yellow or Red  ?Were vital signs taken at a resting state? Yes  ?Focused Assessment No change from prior assessment  ?Does the patient meet 2 or more of the SIRS criteria? No  ?MEWS guidelines implemented *See Row Information* No, previously yellow, continue vital signs every 4 hours  ?Treat  ?Pain Scale 0-10  ?Pain Score 7  ?Pain Type Acute pain;Chronic pain  ?Pain Location Throat  ?Pain Orientation Mid  ?Pain Intervention(s) Medication (See eMAR)  ?Assess: SIRS CRITERIA  ?SIRS Temperature  0  ?SIRS Pulse 1  ?SIRS Respirations  0  ?SIRS WBC 0  ?SIRS Score Sum  1  ? ?No distress noted, no changes, will continue to monitor pt closely. Kjones RN ?

## 2022-02-07 DIAGNOSIS — J441 Chronic obstructive pulmonary disease with (acute) exacerbation: Secondary | ICD-10-CM

## 2022-02-07 DIAGNOSIS — J9601 Acute respiratory failure with hypoxia: Secondary | ICD-10-CM

## 2022-02-07 LAB — RENAL FUNCTION PANEL
Albumin: 2.9 g/dL — ABNORMAL LOW (ref 3.5–5.0)
Anion gap: 5 (ref 5–15)
BUN: 8 mg/dL (ref 6–20)
CO2: 24 mmol/L (ref 22–32)
Calcium: 9 mg/dL (ref 8.9–10.3)
Chloride: 104 mmol/L (ref 98–111)
Creatinine, Ser: 0.85 mg/dL (ref 0.44–1.00)
GFR, Estimated: >60 mL/min (ref >60)
Glucose, Bld: 89 mg/dL (ref 70–99)
Phosphorus: 3.8 mg/dL (ref 2.5–4.6)
Potassium: 3.6 mmol/L (ref 3.5–5.1)
Sodium: 133 mmol/L — ABNORMAL LOW (ref 135–145)

## 2022-02-07 LAB — CBC
HCT: 27.8 % — ABNORMAL LOW (ref 36.0–46.0)
Hemoglobin: 9.1 g/dL — ABNORMAL LOW (ref 12.0–15.0)
MCH: 34.2 pg — ABNORMAL HIGH (ref 26.0–34.0)
MCHC: 32.7 g/dL (ref 30.0–36.0)
MCV: 104.5 fL — ABNORMAL HIGH (ref 80.0–100.0)
Platelets: 162 10*3/uL (ref 150–400)
RBC: 2.66 MIL/uL — ABNORMAL LOW (ref 3.87–5.11)
RDW: 19.2 % — ABNORMAL HIGH (ref 11.5–15.5)
WBC: 6.4 10*3/uL (ref 4.0–10.5)
nRBC: 0 % (ref 0.0–0.2)

## 2022-02-07 LAB — RETICULOCYTES
Immature Retic Fract: 15.9 % (ref 2.3–15.9)
RBC.: 2.66 MIL/uL — ABNORMAL LOW (ref 3.87–5.11)
Retic Count, Absolute: 43.1 10*3/uL (ref 19.0–186.0)
Retic Ct Pct: 1.6 % (ref 0.4–3.1)

## 2022-02-07 LAB — IRON AND TIBC
Iron: 46 ug/dL (ref 28–170)
Saturation Ratios: 29 % (ref 10.4–31.8)
TIBC: 157 ug/dL — ABNORMAL LOW (ref 250–450)
UIBC: 111 ug/dL

## 2022-02-07 LAB — FOLATE: Folate: 3.6 ng/mL — ABNORMAL LOW (ref >5.9)

## 2022-02-07 LAB — VITAMIN B12: Vitamin B-12: 406 pg/mL (ref 180–914)

## 2022-02-07 LAB — FERRITIN: Ferritin: 990 ng/mL — ABNORMAL HIGH (ref 11–307)

## 2022-02-07 LAB — MAGNESIUM: Magnesium: 1.7 mg/dL (ref 1.7–2.4)

## 2022-02-07 MED ORDER — METHYLPREDNISOLONE SODIUM SUCC 40 MG IJ SOLR
40.0000 mg | Freq: Two times a day (BID) | INTRAMUSCULAR | Status: DC
  Administered 2022-02-07 – 2022-02-08 (×3): 40 mg via INTRAVENOUS
  Filled 2022-02-07 (×3): qty 1

## 2022-02-07 MED ORDER — ARFORMOTEROL TARTRATE 15 MCG/2ML IN NEBU
15.0000 ug | INHALATION_SOLUTION | Freq: Two times a day (BID) | RESPIRATORY_TRACT | Status: DC
  Administered 2022-02-07 – 2022-02-08 (×3): 15 ug via RESPIRATORY_TRACT
  Filled 2022-02-07 (×4): qty 2

## 2022-02-07 MED ORDER — MAGNESIUM SULFATE 2 GM/50ML IV SOLN
2.0000 g | Freq: Once | INTRAVENOUS | Status: AC
Start: 2022-02-07 — End: 2022-02-07
  Administered 2022-02-07: 2 g via INTRAVENOUS

## 2022-02-07 MED ORDER — METOPROLOL TARTRATE 25 MG PO TABS
25.0000 mg | ORAL_TABLET | Freq: Two times a day (BID) | ORAL | Status: DC
  Administered 2022-02-07 – 2022-02-08 (×3): 25 mg via ORAL
  Filled 2022-02-07 (×3): qty 1

## 2022-02-07 MED ORDER — DIPHENHYDRAMINE HCL 25 MG PO CAPS
25.0000 mg | ORAL_CAPSULE | Freq: Once | ORAL | Status: AC
  Administered 2022-02-07: 25 mg via ORAL
  Filled 2022-02-07: qty 1

## 2022-02-07 MED ORDER — FOLIC ACID 1 MG PO TABS
1.0000 mg | ORAL_TABLET | Freq: Every day | ORAL | Status: DC
  Administered 2022-02-07 – 2022-02-08 (×2): 1 mg via ORAL
  Filled 2022-02-07 (×2): qty 1

## 2022-02-07 MED ORDER — DM-GUAIFENESIN ER 30-600 MG PO TB12: 1.0000 | ORAL_TABLET | Freq: Two times a day (BID) | ORAL | Status: DC

## 2022-02-07 MED ORDER — IPRATROPIUM-ALBUTEROL 0.5-2.5 (3) MG/3ML IN SOLN: 3.0000 mL | Freq: Four times a day (QID) | RESPIRATORY_TRACT | Status: DC | PRN

## 2022-02-07 MED ORDER — GUAIFENESIN 100 MG/5ML PO LIQD
15.0000 mL | Freq: Four times a day (QID) | ORAL | Status: DC
  Administered 2022-02-07 (×3): 15 mL via ORAL
  Filled 2022-02-07 (×4): qty 20

## 2022-02-07 MED ORDER — POTASSIUM CHLORIDE 20 MEQ PO PACK
40.0000 meq | PACK | Freq: Once | ORAL | Status: AC
Start: 2022-02-07 — End: 2022-02-07
  Administered 2022-02-07: 40 meq via ORAL
  Filled 2022-02-07: qty 2

## 2022-02-07 MED ORDER — BUDESONIDE 0.5 MG/2ML IN SUSP
0.5000 mg | Freq: Two times a day (BID) | RESPIRATORY_TRACT | Status: DC
  Administered 2022-02-07 – 2022-02-08 (×3): 0.5 mg via RESPIRATORY_TRACT
  Filled 2022-02-07 (×3): qty 2

## 2022-02-07 NOTE — Assessment & Plan Note (Signed)
See respiratory failure. ?

## 2022-02-07 NOTE — Progress Notes (Signed)
SATURATION QUALIFICATIONS: (This note is used to comply with regulatory documentation for home oxygen) ? ?Patient Saturations on Room Air at Rest = 89% ? ?Patient Saturations on Room Air while Ambulating = 83% ? ?Patient Saturations on 2 Liters of oxygen while Ambulating = 93% ? ?Please briefly explain why patient needs home oxygen: Patient requires oxygen when ambulating  ?

## 2022-02-07 NOTE — Assessment & Plan Note (Addendum)
Likely due to COPD exacerbation.  Resolved. ?-Received IV Solu-Medrol in house for 1 day ?-Discharged on Spiriva and as needed albuterol ?-Encouraged smoking cessation. ?

## 2022-02-07 NOTE — Progress Notes (Signed)
?PROGRESS NOTE ? ?[name] JKD:[id number] DOB: 1971-01-21  ? ?PCP: [name], PA-C ? ?Patient is from: Home ? ?DOA: 02/02/2022 LOS: 4 ? ?Chief complaints ?Chief Complaint  ?Patient presents with  ? Esophagitis  ?  ? ?Brief Narrative / Interim history: ?51 y.o. 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  GI was consulted.  She underwent EGD on 02/02/2022 and again on 02/04/2022 that showed esophagitis along with esophageal stenosis with mucosal tear, hence dilation was not performed.  GI signed off.  ? ?Subjective: ?Seen and examined earlier this morning.  No major events overnight of this morning.  She reports difficulty swallowing again.  She is frustrated with this.  She reports cough with minimal clear phlegm.  She denies shortness of breath.  Desaturated to 83% with ambulation on room air requiring 2 L.  ? ?Objective: ?Vitals:  ? 02/06/22 1959 02/07/22 0147 02/07/22 0447 02/07/22 1123  ?BP: 124/82 114/84 95/74   ?Pulse: (!) 107 98 98   ?Resp: 16 14 18    ?Temp: 98 ?F (36.7 ?C) (!) 97.4 ?F (36.3 ?C) 97.8 ?F (36.6 ?C)   ?TempSrc: Oral Oral Oral   ?SpO2: 95% 94% 96% 93%  ?Weight:      ?Height:      ? ? ?Examination: ? ?GENERAL: No apparent distress.  Nontoxic. ?HEENT: MMM.  Vision and hearing grossly intact.  ?NECK: Supple.  No apparent JVD.  ?RESP: On RA.  No IWOB.  Some rhonchi bilaterally. ?CVS:  RRR. Heart sounds normal.  ?ABD/GI/GU: BS+. Abd soft, NTND.  ?MSK/EXT:  Moves extremities. No apparent deformity. No edema.  ?SKIN: no apparent skin lesion or wound ?NEURO: Awake and alert. Oriented appropriately.  No apparent focal neuro deficit. ?PSYCH: Calm. Normal affect.  ? ?Procedures:  ?3/11 and 3/13-EGD ? ?Microbiology summarized: ?COVID-19 and influenza PCR  nonreactive. ? ?Assessment and Plan: ?* Esophageal dysphagia ?S/p EGD on on 3/11 and 3/13.  EGD revealed esophagitis along with esophageal stenosis with mucosal tear, hence dilation was not performed.  Continues to endorse dysphagia ?-Continue PPI and Carafate ?-Continue soft diet.  May have to back off to full liquid diet ?-Decrease Robaxin.  May have to wean off completely ?-GI signed off. ? ?Lightheadedness ?Unclear etiology of this.  Orthostatic vitals negative. ?-Decreased Robaxin.  Consider weaning off completely ? ?Esophageal stricture ?See dysphagia. ? ?Radiation-induced esophagitis ?See dysphagia. ? ?Acute respiratory failure with hypoxia ([place]) ?Desaturated to 83% with ambulation on room air requiring 2 L to recover.  CXR with emphysema.  She has cough and rhonchi on exam concerning for COPD exacerbation.  Doubt aspiration pneumonia at this time. ?-Start IV Solu-Medrol, Brovana and Pulmicort. ?-DuoNeb as needed ?-Encourage incentive spirometry/OOB ?-Daily ambulatory saturation assessment ? ?COPD with acute exacerbation ([place]) ?See respiratory failure. ? ?Macrocytic anemia ?Recent Labs  ?  12/27/21 ?0428 01/01/22 ?1157 01/07/22 ?0998 01/17/22 ?1049 02/02/22 ?3382 02/03/22 ?0604 02/04/22 ?0421 02/05/22 ?5053 02/06/22 ?9767 02/07/22 ?0530  ?HGB 8.5* 11.4* 12.1 11.1* 12.3 9.6* 8.8* 8.8* 8.3* 9.1*  ?H&H stable after initial drop likely from IV fluid.  Patient denies GI bleed.  Anemia panel with folate deficiency to 3.6 ?-Start folic acid replacement. ?-Monitor H&H ? ? ?DVT of upper extremity (deep vein thrombosis) ([place]) ?Continue home Eliquis ? ?Sinus tachycardia ?TSH within normal.  Improved with p.o. metoprolol. ?-Increase metoprolol  to 25 mg twice daily ? ?Thrombocytopenia ([place]) ?Resolved. ? ? ?Hypokalemia ?K 3.6. ?-P.o. K-Dur 40x1 ? ?Hypomagnesemia ?Mg 1.7. ?-IV magnesium sulfate 2 g x 1 ? ? ?Increased nutrient needs ?Body mass index is 22.83 kg/m?Marland Kitchen ?Nutrition Problem: Increased nutrient needs ?Etiology:  cancer and cancer related treatments ?Signs/Symptoms: estimated needs ?Interventions: MVI, Magic cup ?  ?DVT prophylaxis:  ? ?apixaban (ELIQUIS) tablet 5 mg  ?Code Status: Full code ?Family Communication: Patient and/or RN. Available if any question.  ?Level of care: Telemetry ?Status is: Inpatient ?Remains inpatient appropriate because: Due to respiratory failure, COPD exacerbation and dysphagia ? ? ?Final disposition: Home ? ?Consultants:  ?GI-signed off ? ?Sch Meds:  ?Scheduled Meds: ? apixaban  5 mg Oral BID  ? arformoterol  15 mcg Nebulization BID  ? budesonide (PULMICORT) nebulizer solution  0.5 mg Nebulization BID  ? folic acid  1 mg Oral Daily  ? guaiFENesin  15 mL Oral Q6H  ? methylPREDNISolone (SOLU-MEDROL) injection  40 mg Intravenous Q12H  ? metoprolol tartrate  25 mg Oral BID  ? multivitamin  15 mL Oral Daily  ? nicotine  14 mg Transdermal Daily  ? ondansetron (ZOFRAN) IV  4 mg Intravenous Q6H  ? pantoprazole (PROTONIX) IV  40 mg Intravenous Q12H  ? sucralfate  1 g Oral TID WC & HS  ? ?Continuous Infusions: ? methocarbamol (ROBAXIN) IV 500 mg (02/06/22 1650)  ? ?PRN Meds:.acetaminophen, glycopyrrolate, HYDROcodone bit-homatropine, HYDROmorphone (DILAUDID) injection, ipratropium-albuterol, methocarbamol (ROBAXIN) IV, metoCLOPramide (REGLAN) injection, oxyCODONE-acetaminophen ? ?Antimicrobials: ?Anti-infectives (From admission, onward)  ? ? None  ? ?  ? ? ? ?I have personally reviewed the following labs and images: ?CBC: ?Recent Labs  ?Lab 02/02/22 ?7619 02/03/22 ?0604 02/04/22 ?0421 02/05/22 ?5093 02/06/22 ?2671 02/07/22 ?0530  ?WBC 6.9 10.5 7.4 8.4 6.6 6.4  ?NEUTROABS 4.8  --  5.4 6.5 5.0  --   ?HGB 12.3 9.6* 8.8* 8.8* 8.3* 9.1*  ?HCT 36.4 28.8* 27.1* 28.0* 25.7* 27.8*  ?MCV 101.4* 103.2* 107.5* 107.7* 105.3* 104.5*  ?PLT 221 169 142* 141* 147* 162  ? ?BMP &GFR ?Recent Labs  ?Lab 02/02/22 ?2458 02/03/22 ?0604 02/04/22 ?0421 02/05/22 ?0998 02/06/22 ?3382 02/07/22 ?0530  ?NA 139 135 136 134* 134* 133*  ?K  3.2* 4.1 4.5 4.0 3.5 3.6  ?CL 108 108 112* 106 108 104  ?CO2 21* 20* 18* 21* 20* 24  ?GLUCOSE 95 117* 83 83 77 89  ?BUN 10 8 <5* 6 6 8   ?CREATININE 0.89 0.81 0.67 0.78 0.66 0.85  ?CALCIUM 9.8 9.1 8.8* 8.7* 8.5* 9.0  ?MG 1.7  --  1.7 1.4* 1.6* 1.7  ?PHOS 3.0  --   --   --   --  3.8  ? ?Estimated Creatinine Clearance: 79.9 mL/min (by C-G formula based on SCr of 0.85 mg/dL). ?Liver & Pancreas: ?Recent Labs  ?Lab 02/02/22 ?5053 02/07/22 ?0530  ?AST 16  --   ?ALT 13  --   ?ALKPHOS 77  --   ?BILITOT 0.5  --   ?PROT 6.8  --   ?ALBUMIN 3.5 2.9*  ? ?No results for input(s): LIPASE, AMYLASE in the last 168 hours. ?No results for input(s): AMMONIA in the last 168 hours. ?Diabetic: ?No results for input(s): HGBA1C in the last 72 hours. ?No results for input(s): GLUCAP in the last 168 hours. ?Cardiac Enzymes: ?No results for input(s): CKTOTAL, CKMB, CKMBINDEX, TROPONINI in the last 168 hours. ?No results for input(s): PROBNP in the last 8760 hours. ?Coagulation Profile: ?No results for input(s):  INR, PROTIME in the last 168 hours. ?Thyroid Function Tests: ?No results for input(s): TSH, T4TOTAL, FREET4, T3FREE, THYROIDAB in the last 72 hours. ?Lipid Profile: ?No results for input(s): CHOL, HDL, LDLCALC, TRIG, CHOLHDL, LDLDIRECT in the last 72 hours. ?Anemia Panel: ?Recent Labs  ?  02/07/22 ?0530  ?VITAMINB12 406  ?FOLATE 3.6*  ?FERRITIN 990*  ?TIBC 157*  ?IRON 46  ?RETICCTPCT 1.6  ? ?Urine analysis: ?   ?Component Value Date/Time  ? COLORURINE YELLOW 12/18/2021 0300  ? APPEARANCEUR CLEAR 12/18/2021 0300  ? LABSPEC 1.013 12/18/2021 0300  ? PHURINE 5.0 12/18/2021 0300  ? GLUCOSEU NEGATIVE 12/18/2021 0300  ? HGBUR SMALL (A) 12/18/2021 0300  ? BILIRUBINUR NEGATIVE 12/18/2021 0300  ? KETONESUR 5 (A) 12/18/2021 0300  ? PROTEINUR 30 (A) 12/18/2021 0300  ? UROBILINOGEN 0.2 12/30/2013 1037  ? NITRITE POSITIVE (A) 12/18/2021 0300  ? LEUKOCYTESUR NEGATIVE 12/18/2021 0300  ? ?Sepsis Labs: ?Invalid input(s): PROCALCITONIN,  LACTICIDVEN ? ?Microbiology: ?Recent Results (from the past 240 hour(s))  ?Resp Panel by RT-PCR (Flu A&B, Covid) Nasopharyngeal Swab     Status: None  ? Collection Time: 02/02/22  6:13 AM  ? Specimen: Nasopharyngeal Swab; Nasoph

## 2022-02-07 NOTE — Progress Notes (Signed)
Pharmacy:Eliquis ? ?Patient's currently on home regimen of Eliquis 5 mg bid for hx RUE DVT. CBC remains stable. No bleeding documented. Pharmacy will sign off for Eliquis, but will follow patient peripherally along with you. Re-consult [place] if need further assistance. ? ?Dia Sitter, PharmD, BCPS ?02/07/2022 9:43 AM ? ? ? ?

## 2022-02-08 ENCOUNTER — Other Ambulatory Visit (HOSPITAL_COMMUNITY): Payer: Self-pay

## 2022-02-08 ENCOUNTER — Encounter: Payer: Self-pay | Admitting: Internal Medicine

## 2022-02-08 ENCOUNTER — Telehealth: Payer: Self-pay

## 2022-02-08 LAB — MAGNESIUM: Magnesium: 1.7 mg/dL (ref 1.7–2.4)

## 2022-02-08 LAB — RENAL FUNCTION PANEL
Albumin: 3 g/dL — ABNORMAL LOW (ref 3.5–5.0)
Anion gap: 8 (ref 5–15)
BUN: 15 mg/dL (ref 6–20)
CO2: 24 mmol/L (ref 22–32)
Calcium: 9.5 mg/dL (ref 8.9–10.3)
Chloride: 103 mmol/L (ref 98–111)
Creatinine, Ser: 0.84 mg/dL (ref 0.44–1.00)
GFR, Estimated: >60 mL/min (ref >60)
Glucose, Bld: 112 mg/dL — ABNORMAL HIGH (ref 70–99)
Phosphorus: 4.1 mg/dL (ref 2.5–4.6)
Potassium: 4.6 mmol/L (ref 3.5–5.1)
Sodium: 135 mmol/L (ref 135–145)

## 2022-02-08 LAB — CBC
HCT: 26.7 % — ABNORMAL LOW (ref 36.0–46.0)
Hemoglobin: 8.9 g/dL — ABNORMAL LOW (ref 12.0–15.0)
MCH: 34.4 pg — ABNORMAL HIGH (ref 26.0–34.0)
MCHC: 33.3 g/dL (ref 30.0–36.0)
MCV: 103.1 fL — ABNORMAL HIGH (ref 80.0–100.0)
Platelets: 162 10*3/uL (ref 150–400)
RBC: 2.59 MIL/uL — ABNORMAL LOW (ref 3.87–5.11)
RDW: 18.8 % — ABNORMAL HIGH (ref 11.5–15.5)
WBC: 7.7 10*3/uL (ref 4.0–10.5)
nRBC: 0 % (ref 0.0–0.2)

## 2022-02-08 MED ORDER — ONDANSETRON 8 MG PO TBDP
8.0000 mg | ORAL_TABLET | Freq: Three times a day (TID) | ORAL | 0 refills | Status: DC | PRN
  Filled 2022-02-08: qty 30, 10d supply, fill #0

## 2022-02-08 MED ORDER — FOLIC ACID 1 MG PO TABS
1.0000 mg | ORAL_TABLET | Freq: Every day | ORAL | 1 refills | Status: DC
  Filled 2022-02-08: qty 90, 90d supply, fill #0

## 2022-02-08 MED ORDER — SPIRIVA HANDIHALER 18 MCG IN CAPS
18.0000 ug | ORAL_CAPSULE | Freq: Every day | RESPIRATORY_TRACT | 2 refills | Status: DC
  Filled 2022-02-08: qty 30, 30d supply, fill #0

## 2022-02-08 MED ORDER — METOPROLOL TARTRATE 25 MG PO TABS
25.0000 mg | ORAL_TABLET | Freq: Two times a day (BID) | ORAL | 1 refills | Status: DC
  Filled 2022-02-08: qty 60, 30d supply, fill #0

## 2022-02-08 MED ORDER — ADULT MULTIVITAMIN LIQUID CH
15.0000 mL | Freq: Every day | ORAL | 1 refills | Status: DC
  Filled 2022-02-08: qty 178, 11d supply, fill #0

## 2022-02-08 MED ORDER — ALBUTEROL SULFATE HFA 108 (90 BASE) MCG/ACT IN AERS
2.0000 | INHALATION_SPRAY | Freq: Four times a day (QID) | RESPIRATORY_TRACT | 2 refills | Status: AC | PRN
  Filled 2022-02-08: qty 18, 25d supply, fill #0

## 2022-02-08 MED ORDER — PANTOPRAZOLE SODIUM 40 MG PO TBEC
40.0000 mg | DELAYED_RELEASE_TABLET | Freq: Two times a day (BID) | ORAL | 0 refills | Status: DC
  Filled 2022-02-08: qty 180, 90d supply, fill #0

## 2022-02-08 MED ORDER — APIXABAN 5 MG PO TABS
5.0000 mg | ORAL_TABLET | Freq: Two times a day (BID) | ORAL | 1 refills | Status: DC
  Filled 2022-02-08: qty 60, 30d supply, fill #0

## 2022-02-08 MED ORDER — OXYCODONE-ACETAMINOPHEN 5-325 MG PO TABS
1.0000 | ORAL_TABLET | Freq: Four times a day (QID) | ORAL | 0 refills | Status: AC | PRN
  Filled 2022-02-08: qty 20, 5d supply, fill #0

## 2022-02-08 NOTE — Telephone Encounter (Signed)
Fax received from pts employer, Jeannie Messick, LOA Coordinator for [employer] wanting to know if pts time off has been extended. ? ?Pt was given a work note on 01/17/22 and it is unclear if pt has returned to work or not.  ? ?I have called and spoken with the pt who advised that she has not returned to work and was told by Dr. Sondra Come that she was not released to return. Pt states a note from Dr. Sondra Come was not offered and she did not ask for one either. Pt was advised to request a work note when she needs one as they are not routinely offered because each pt has unique personal circumstances. Pt expressed understanding of this information and states she will contact her employer for the paperwork. ?

## 2022-02-08 NOTE — Discharge Summary (Signed)
? ?Physician Discharge Summary  ?[name] SAY:[id number] DOB: [birth year] DOA: 02/02/2022 ? ?PCP: Pcp, No ? ?Admit date: 02/02/2022 ?Discharge date: 02/08/2022 ?Admitted From: Home ?Disposition: Home ?Recommendations for Outpatient Follow-up:  ?Follow ups as below. ?Please obtain CBC/BMP/Mag at follow up ?Please follow up on the following pending results: None ? ?Home Health: Not indicated ?Equipment/Devices: Not indicated ? ?Discharge Condition: Stable ?CODE STATUS: Full code ? Follow-up Information   ? ? Livengood, Marva Panda, PA-C. Schedule an appointment as soon as possible for a visit in 1 week(s).   ?Specialty: Physician Assistant ?Contact information: ?[address] ?[place] [address] ?[phone] ? ? ?  ?  ? ?  ?  ? ?  ? ? ?Hospital course ?51 y.o. 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  GI was consulted.  She underwent EGD on 02/02/2022 and again on 02/04/2022 that showed esophagitis along with esophageal stenosis with mucosal tear, hence dilation was not performed.  Continued on home Protonix and Carafate.  Eventually, symptoms improved.  She tolerated soft diet.  Cleared for discharge by gastroenterology. ? ?Hospital course complicated by acute hypoxic respiratory failure in the setting of COPD exacerbation.  Started on systemic steroid and nebulizers with improvement in her breathing.  Discharge on Spiriva and albuterol as needed.  ? ?See individual problem list below for more on hospital course. ? ?Problems addressed during this hospitalization ?Problem  ?Esophageal Dysphagia  ?Esophageal Stricture  ?Acute Respiratory Failure With Hypoxia ([facility])  ?Radiation-Induced Esophagitis  ?Copd With Acute Exacerbation ([facility])  ?Macrocytic Anemia  ?Dvt of Upper Extremity (Deep Vein Thrombosis)  ([facility])  ?Sinus Tachycardia  ?Lightheadedness (Resolved)  ?Hypomagnesemia (Resolved)  ?Hypokalemia (Resolved)  ?Thrombocytopenia ([facility]) (Resolved)  ?  ?Assessment and Plan: ?* Esophageal dysphagia ?S/p EGD on on 3/11 and 3/13.  EGD revealed esophagitis along with esophageal stenosis with mucosal tear, hence dilation was not performed.  Dysphagia improved.  GI signed off. ?-Continue PPI and Carafate ?-Patient to continue soft diet ? ?Esophageal stricture ?See dysphagia. ? ?Radiation-induced esophagitis ?See dysphagia. ? ?Acute respiratory failure with hypoxia ([place]) ?Likely due to COPD exacerbation.  Resolved. ?-Received IV Solu-Medrol in house for 1 day ?-Discharged on Spiriva and as needed albuterol ?-Encouraged smoking cessation. ? ?Lightheadedness-resolved as of 02/08/2022 ?Unclear etiology of this.  Orthostatic vitals negative.  Resolved after stopping Robaxin. ? ?COPD with acute exacerbation ([place]) ?See respiratory failure. ? ?Macrocytic anemia ?Recent Labs  ?  01/01/22 ?1157 01/07/22 ?6237 01/17/22 ?1049 02/02/22 ?6283 02/03/22 ?0604 02/04/22 ?0421 02/05/22 ?1517 02/06/22 ?6160 02/07/22 ?0530 02/08/22 ?0515  ?HGB 11.4* 12.1 11.1* 12.3 9.6* 8.8* 8.8* 8.3* 9.1* 8.9*  ?H&H stable after initial drop likely from IV fluid.  Patient denies GI bleed.  Anemia panel with folate deficiency to 3.6 ?-Discharge on p.o. folic acid ?-Recheck CBC at follow-up ? ? ?DVT of upper extremity (deep vein thrombosis) ([place]) ?Continue home Eliquis ? ?Sinus tachycardia ?TSH within normal.  Improved with p.o. metoprolol. ?-Discharged on p.o. metoprolol 25 mg twice daily ? ?Thrombocytopenia ([facility])-resolved as of 02/08/2022 ?Resolved. ? ? ?Hypokalemia-resolved as of 02/08/2022 ?Resolved. ? ?Hypomagnesemia-resolved as of 02/08/2022 ?Resolved. ? ? ? ?Vital signs ?Vitals:  ? 02/08/22 0509 02/08/22 0820 02/08/22 7371 02/08/22 0914  ?BP: 109/90     ?Pulse: (!) 108     ?Temp: 98 ?F (36.7 ?C)     ?  Resp: 18     ?Height:      ?Weight:      ?SpO2: 95% 95% 95%  97%  ?TempSrc: Oral     ?BMI (Calculated):      ?  ? ?Discharge exam ? ?GENERAL: No apparent distress.  Nontoxic. ?HEENT: MMM.  Vision and hearing grossly intact.  ?NECK: Supple.  No apparent JVD.  ?RESP: On RA.  No IWOB.  Fair aeration bilaterally. ?CVS: HR ranges from 90s to 100.  Regular rhythm.  Heart sounds normal.  ?ABD/GI/GU: BS+. Abd soft, NTND.  ?MSK/EXT:  Moves extremities. No apparent deformity. No edema.  ?SKIN: no apparent skin lesion or wound ?NEURO: Awake and alert. Oriented appropriately.  No apparent focal neuro deficit. ?PSYCH: Calm. Normal affect.  ? ?Discharge Instructions ?Discharge Instructions   ? ? Call MD for:  difficulty breathing, headache or visual disturbances   Complete by: As directed ?  ? Call MD for:  extreme fatigue   Complete by: As directed ?  ? Call MD for:  persistant nausea and vomiting   Complete by: As directed ?  ? Call MD for:  severe uncontrolled pain   Complete by: As directed ?  ? Diet general   Complete by: As directed ?  ? Mechanically soft diet or full liquid diet as tolerated  ? Discharge instructions   Complete by: As directed ?  ? It has been a pleasure taking care of you! ? ?You were hospitalized due to difficulty swallowing.  Continue soft diet.  We also recommend you take your Carafate and Protonix as prescribed.  Please review your new medication list and the directions on your medications before you take them.  Follow-up with your primary care doctor, oncologist and gastroenterologist in 1 to 2 weeks or sooner if needed.  We have also started you on inhaler for your cough and breathing issue.  ? ? ?Take care,  ? Increase activity slowly   Complete by: As directed ?  ? ?  ? ?Allergies as of 02/08/2022   ? ?   Reactions  ? Naproxen Nausea Only  ? Bactrim [sulfamethoxazole-trimethoprim] Nausea Only  ? ?  ? ?  ?Medication List  ?  ? ?STOP taking these medications   ? ?Apixaban Starter Pack (10mg  and 5mg ) ?Commonly known as: ELIQUIS STARTER PACK ?Replaced by:  Eliquis 5 MG Tabs tablet ?  ?HYDROmorphone 2 MG tablet ?Commonly known as: DILAUDID ?  ? ?  ? ?TAKE these medications   ? ?diphenhydramine-acetaminophen 25-500 MG Tabs tablet ?Commonly known as: TYLENOL PM ?Take 3 tablets by mouth at bedtime as needed (for sleep). ?  ?Eliquis 5 MG Tabs tablet ?Generic drug: apixaban ?Take 1 tablet (5 mg total) by mouth 2 (two) times daily. ?Replaces: Apixaban Starter Pack (10mg  and 5mg ) ?  ?folic acid 1 MG tablet ?Commonly known as: FOLVITE ?Take 1 tablet (1 mg total) by mouth daily. ?Start taking on: February 09, 2022 ?  ?guaiFENesin-dextromethorphan 100-10 MG/5ML syrup ?Commonly known as: ROBITUSSIN DM ?Take 5 mLs by mouth every 4 (four) hours as needed for cough. ?  ?metoprolol tartrate 25 MG tablet ?Commonly known as: LOPRESSOR ?Take 1 tablet (25 mg total) by mouth 2 (two) times daily. ?  ?multivitamin Liqd ?Take 15 mLs by mouth daily. ?  ?nicotine 21 mg/24hr patch ?Commonly known as: NICODERM CQ - dosed in mg/24 hours ?Place 1 patch (21 mg total) onto the skin daily. ?  ?ondansetron 8 MG disintegrating tablet ?Commonly known as: ZOFRAN-ODT ?Dissolve  1 tablet (8 mg total) by mouth every 8 (eight) hours as needed for nausea or vomiting. ?  ?oxyCODONE-acetaminophen 5-325 MG tablet ?Commonly known as: PERCOCET/ROXICET ?Take 1 tablet by mouth every 6 (six) hours as needed for up to 5 days for severe pain. ?  ?pantoprazole 40 MG tablet ?Commonly known as: PROTONIX ?Take 1 tablet (40 mg total) by mouth 2 (two) times daily. ?  ?Spiriva HandiHaler 18 MCG inhalation capsule ?Generic drug: tiotropium ?Place 1 capsule (18 mcg total) into inhaler and inhale daily. ?  ?sucralfate 1 GM/10ML suspension ?Commonly known as: CARAFATE ?Take 10 mLs (1 g total) by mouth 4 (four) times daily -  with meals and at bedtime. ?What changed: when to take this ?  ?Ventolin HFA 108 (90 Base) MCG/ACT inhaler ?Generic drug: albuterol ?Inhale 2 puffs into the lungs every 6 (six) hours as needed for wheezing or  shortness of breath. ?  ? ?  ? ? ?Consultations: ?Gastroenterology ? ?Procedures/Studies: ?EGD on 3/11 and 3/13 ? ? ?DG Chest 2 View ? ?Result Date: 02/02/2022 ?CLINICAL DATA:  Bronchi. Trouble swallowing. His

## 2022-02-11 ENCOUNTER — Emergency Department (HOSPITAL_COMMUNITY): Payer: Medicaid Other

## 2022-02-11 ENCOUNTER — Emergency Department (HOSPITAL_COMMUNITY): Payer: Medicaid Other | Admitting: Anesthesiology

## 2022-02-11 ENCOUNTER — Emergency Department (EMERGENCY_DEPARTMENT_HOSPITAL): Payer: Medicaid Other | Admitting: Anesthesiology

## 2022-02-11 ENCOUNTER — Encounter (HOSPITAL_COMMUNITY): Payer: Self-pay

## 2022-02-11 ENCOUNTER — Encounter (HOSPITAL_COMMUNITY)
Admission: EM | Disposition: A | Discharge status: Home / Self Care | Payer: Self-pay | Source: Home / Self Care | Attending: Emergency Medicine

## 2022-02-11 ENCOUNTER — Emergency Department (HOSPITAL_COMMUNITY)
Admission: EM | Admit: 2022-02-11 | Discharge: 2022-02-11 | Disposition: A | Discharge status: Home / Self Care | Payer: Medicaid Other | Attending: Emergency Medicine | Admitting: Emergency Medicine

## 2022-02-11 DIAGNOSIS — J449 Chronic obstructive pulmonary disease, unspecified: Secondary | ICD-10-CM | POA: Diagnosis not present

## 2022-02-11 DIAGNOSIS — K219 Gastro-esophageal reflux disease without esophagitis: Secondary | ICD-10-CM | POA: Diagnosis not present

## 2022-02-11 DIAGNOSIS — K222 Esophageal obstruction: Secondary | ICD-10-CM | POA: Insufficient documentation

## 2022-02-11 DIAGNOSIS — Z86718 Personal history of other venous thrombosis and embolism: Secondary | ICD-10-CM | POA: Diagnosis not present

## 2022-02-11 DIAGNOSIS — Z79899 Other long term (current) drug therapy: Secondary | ICD-10-CM | POA: Diagnosis not present

## 2022-02-11 DIAGNOSIS — X58XXXA Exposure to other specified factors, initial encounter: Secondary | ICD-10-CM | POA: Insufficient documentation

## 2022-02-11 DIAGNOSIS — Z85118 Personal history of other malignant neoplasm of bronchus and lung: Secondary | ICD-10-CM | POA: Diagnosis not present

## 2022-02-11 DIAGNOSIS — T189XXA Foreign body of alimentary tract, part unspecified, initial encounter: Secondary | ICD-10-CM

## 2022-02-11 DIAGNOSIS — Z7901 Long term (current) use of anticoagulants: Secondary | ICD-10-CM | POA: Diagnosis not present

## 2022-02-11 DIAGNOSIS — T18128A Food in esophagus causing other injury, initial encounter: Secondary | ICD-10-CM | POA: Insufficient documentation

## 2022-02-11 DIAGNOSIS — Y842 Radiological procedure and radiotherapy as the cause of abnormal reaction of the patient, or of later complication, without mention of misadventure at the time of the procedure: Secondary | ICD-10-CM | POA: Diagnosis not present

## 2022-02-11 DIAGNOSIS — F17210 Nicotine dependence, cigarettes, uncomplicated: Secondary | ICD-10-CM | POA: Diagnosis not present

## 2022-02-11 DIAGNOSIS — F418 Other specified anxiety disorders: Secondary | ICD-10-CM | POA: Diagnosis not present

## 2022-02-11 DIAGNOSIS — R000 Tachycardia, unspecified: Secondary | ICD-10-CM | POA: Insufficient documentation

## 2022-02-11 DIAGNOSIS — I10 Essential (primary) hypertension: Secondary | ICD-10-CM | POA: Insufficient documentation

## 2022-02-11 DIAGNOSIS — Z923 Personal history of irradiation: Secondary | ICD-10-CM | POA: Diagnosis not present

## 2022-02-11 DIAGNOSIS — K208 Other esophagitis without bleeding: Secondary | ICD-10-CM | POA: Diagnosis not present

## 2022-02-11 DIAGNOSIS — Z20822 Contact with and (suspected) exposure to covid-19: Secondary | ICD-10-CM | POA: Diagnosis not present

## 2022-02-11 DIAGNOSIS — F32A Depression, unspecified: Secondary | ICD-10-CM | POA: Insufficient documentation

## 2022-02-11 HISTORY — PX: FOREIGN BODY REMOVAL: SHX962

## 2022-02-11 HISTORY — PX: ESOPHAGOGASTRODUODENOSCOPY: SHX5428

## 2022-02-11 LAB — RESP PANEL BY RT-PCR (FLU A&B, COVID) ARPGX2
Influenza A by PCR: NEGATIVE
Influenza B by PCR: NEGATIVE
SARS Coronavirus 2 by RT PCR: NEGATIVE

## 2022-02-11 LAB — COMPREHENSIVE METABOLIC PANEL
ALT: 16 U/L (ref 0–44)
AST: 15 U/L (ref 15–41)
Albumin: 3.3 g/dL — ABNORMAL LOW (ref 3.5–5.0)
Alkaline Phosphatase: 54 U/L (ref 38–126)
Anion gap: 8 (ref 5–15)
BUN: 8 mg/dL (ref 6–20)
CO2: 28 mmol/L (ref 22–32)
Calcium: 9.3 mg/dL (ref 8.9–10.3)
Chloride: 104 mmol/L (ref 98–111)
Creatinine, Ser: 0.92 mg/dL (ref 0.44–1.00)
GFR, Estimated: >60 mL/min (ref >60)
Glucose, Bld: 80 mg/dL (ref 70–99)
Potassium: 3.2 mmol/L — ABNORMAL LOW (ref 3.5–5.1)
Sodium: 140 mmol/L (ref 135–145)
Total Bilirubin: 0.3 mg/dL (ref 0.3–1.2)
Total Protein: 6.6 g/dL (ref 6.5–8.1)

## 2022-02-11 LAB — CBC WITH DIFFERENTIAL/PLATELET
Abs Immature Granulocytes: 0.12 10*3/uL — ABNORMAL HIGH (ref 0.00–0.07)
Basophils Absolute: 0 10*3/uL (ref 0.0–0.1)
Basophils Relative: 0 %
Eosinophils Absolute: 0.1 10*3/uL (ref 0.0–0.5)
Eosinophils Relative: 2 %
HCT: 30.2 % — ABNORMAL LOW (ref 36.0–46.0)
Hemoglobin: 9.9 g/dL — ABNORMAL LOW (ref 12.0–15.0)
Immature Granulocytes: 2 %
Lymphocytes Relative: 17 %
Lymphs Abs: 1.2 10*3/uL (ref 0.7–4.0)
MCH: 33.8 pg (ref 26.0–34.0)
MCHC: 32.8 g/dL (ref 30.0–36.0)
MCV: 103.1 fL — ABNORMAL HIGH (ref 80.0–100.0)
Monocytes Absolute: 0.8 10*3/uL (ref 0.1–1.0)
Monocytes Relative: 12 %
Neutro Abs: 4.5 10*3/uL (ref 1.7–7.7)
Neutrophils Relative %: 67 %
Platelets: 197 10*3/uL (ref 150–400)
RBC: 2.93 MIL/uL — ABNORMAL LOW (ref 3.87–5.11)
RDW: 18.4 % — ABNORMAL HIGH (ref 11.5–15.5)
WBC: 6.7 10*3/uL (ref 4.0–10.5)
nRBC: 0 % (ref 0.0–0.2)

## 2022-02-11 LAB — PROTIME-INR
INR: 1.1 (ref 0.8–1.2)
Prothrombin Time: 14.3 seconds (ref 11.4–15.2)

## 2022-02-11 SURGERY — EGD (ESOPHAGOGASTRODUODENOSCOPY)
Anesthesia: General

## 2022-02-11 MED ORDER — FENTANYL CITRATE (PF) 100 MCG/2ML IJ SOLN
INTRAMUSCULAR | Status: DC | PRN
  Administered 2022-02-11 (×4): 50 ug via INTRAVENOUS

## 2022-02-11 MED ORDER — DEXAMETHASONE SODIUM PHOSPHATE 10 MG/ML IJ SOLN
INTRAMUSCULAR | Status: DC | PRN
  Administered 2022-02-11: 10 mg via INTRAVENOUS

## 2022-02-11 MED ORDER — PROPOFOL 10 MG/ML IV BOLUS
INTRAVENOUS | Status: AC
  Filled 2022-02-11: qty 20

## 2022-02-11 MED ORDER — MORPHINE SULFATE (PF) 4 MG/ML IV SOLN
4.0000 mg | Freq: Once | INTRAVENOUS | Status: AC
  Administered 2022-02-11: 4 mg via INTRAVENOUS
  Filled 2022-02-11: qty 1

## 2022-02-11 MED ORDER — SUCCINYLCHOLINE CHLORIDE 200 MG/10ML IV SOSY
PREFILLED_SYRINGE | INTRAVENOUS | Status: DC | PRN
  Administered 2022-02-11: 140 mg via INTRAVENOUS

## 2022-02-11 MED ORDER — PROPOFOL 10 MG/ML IV BOLUS
INTRAVENOUS | Status: DC | PRN
  Administered 2022-02-11: 150 mg via INTRAVENOUS

## 2022-02-11 MED ORDER — LIDOCAINE HCL (CARDIAC) PF 100 MG/5ML IV SOSY
PREFILLED_SYRINGE | INTRAVENOUS | Status: DC | PRN
  Administered 2022-02-11: 100 mg via INTRAVENOUS

## 2022-02-11 MED ORDER — FENTANYL CITRATE (PF) 100 MCG/2ML IJ SOLN
INTRAMUSCULAR | Status: AC
Start: 2022-02-11 — End: ?
  Filled 2022-02-11: qty 2

## 2022-02-11 MED ORDER — GLUCAGON HCL RDNA (DIAGNOSTIC) 1 MG IJ SOLR
1.0000 mg | Freq: Once | INTRAMUSCULAR | Status: AC
Start: 2022-02-11 — End: 2022-02-11
  Administered 2022-02-11: 1 mg via INTRAVENOUS
  Filled 2022-02-11: qty 1

## 2022-02-11 MED ORDER — FENTANYL CITRATE (PF) 100 MCG/2ML IJ SOLN
INTRAMUSCULAR | Status: AC
  Filled 2022-02-11: qty 2

## 2022-02-11 MED ORDER — LACTATED RINGERS IV SOLN: INTRAVENOUS | Status: DC | PRN

## 2022-02-11 MED ORDER — ONDANSETRON HCL 4 MG/2ML IJ SOLN
4.0000 mg | Freq: Once | INTRAMUSCULAR | Status: AC
  Administered 2022-02-11: 4 mg via INTRAVENOUS
  Filled 2022-02-11: qty 2

## 2022-02-11 MED ORDER — ONDANSETRON HCL 4 MG/2ML IJ SOLN
INTRAMUSCULAR | Status: DC | PRN
  Administered 2022-02-11: 4 mg via INTRAVENOUS

## 2022-02-11 MED ORDER — SODIUM CHLORIDE 0.9 % IV BOLUS
500.0000 mL | Freq: Once | INTRAVENOUS | Status: AC
  Administered 2022-02-11: 500 mL via INTRAVENOUS

## 2022-02-11 MED ORDER — MORPHINE SULFATE (PF) 4 MG/ML IV SOLN: 4.0000 mg | Freq: Once | INTRAVENOUS | Status: DC

## 2022-02-11 NOTE — Anesthesia Preprocedure Evaluation (Addendum)
Anesthesia Evaluation  ?Patient identified by MRN, date of birth, ID band ?Patient awake ? ? ? ?Reviewed: ?Allergy & Precautions, NPO status , Patient's Chart, lab work & pertinent test results ? ?Airway ?Mallampati: II ? ?TM Distance: >3 FB ?Neck ROM: Full ? ? ? Dental ? ?(+) Partial Upper ?  ?Pulmonary ?COPD, Current Smoker and Patient abstained from smoking.,  ?H/o lung cancer: XRT R lung ?  ?Pulmonary exam normal ? ? ? ? ? ? ? Cardiovascular ?hypertension, Pt. on medications and Pt. on home beta blockers ? ?Rhythm:Regular Rate:Normal ? ?11/2021 ECHO: EF 60-65%. The LV has normal function,  no regional wall motion abnormalities. Left ventricular diastolic parameters were normal. RVF is normal, no significant valvular abnormalities, pericardial effusion anterior to RV ?  ?Neuro/Psych ?Anxiety Depression   ? GI/Hepatic ?GERD  Medicated,Radiation induced esophagitis ?  ?Endo/Other  ? ? Renal/GU ?  ? ?  ?Musculoskeletal ? ? Abdominal ?Normal abdominal exam  (+)   ?Peds ? Hematology ? ?(+) Blood dyscrasia (Hb 9.9), anemia , eliquis   ?Anesthesia Other Findings ? ? Reproductive/Obstetrics ? ?  ? ? ? ? ? ? ? ? ? ? ? ? ? ?  ?  ? ? ? ? ? ? ? ?Anesthesia Physical ?Anesthesia Plan ? ?ASA: 3 ? ?Anesthesia Plan: General  ? ?Post-op Pain Management:   ? ?Induction: Intravenous and Rapid sequence ? ?PONV Risk Score and Plan: 2 and Ondansetron, Dexamethasone and Treatment may vary due to age or medical condition ? ?Airway Management Planned: Mask and Oral ETT ? ?Additional Equipment: None ? ?Intra-op Plan:  ? ?Post-operative Plan: Extubation in OR ? ?Informed Consent: I have reviewed the patients History and Physical, chart, labs and discussed the procedure including the risks, benefits and alternatives for the proposed anesthesia with the patient or authorized representative who has indicated his/her understanding and acceptance.  ? ? ? ?Dental advisory given ? ?Plan Discussed with:   ? ?Anesthesia Plan Comments: (Lab Results ?     Component                Value               Date                 ?     WBC                      6.7                 02/11/2022           ?     HGB                      9.9 (L)             02/11/2022           ?     HCT                      30.2 (L)            02/11/2022           ?     MCV                      103.1 (H)           02/11/2022           ?  PLT                      197                 02/11/2022           ?Lab Results ?     Component                Value               Date                 ?     NA                       140                 02/11/2022           ?     K                        3.2 (L)             02/11/2022           ?     CO2                      28                  02/11/2022           ?     GLUCOSE                  80                  02/11/2022           ?     BUN                      8                   02/11/2022           ?     CREATININE               0.92                02/11/2022           ?     CALCIUM                  9.3                 02/11/2022           ?     GFRNONAA                 >60                 02/11/2022          )  ? ? ? ? ? ? ?Anesthesia Quick Evaluation ? ?

## 2022-02-11 NOTE — Op Note (Signed)
[facility] ?Patient Name: [name] ?Procedure Date: 02/11/2022 ?MRN: [id number] ?Attending MD: Arta Silence , MD ?Date of Birth: 1971-04-17 ?CSN: [id number] ?Age: 51 ?Admit Type: Inpatient ?Procedure:                Upper GI endoscopy ?Indications:              Foreign body in the esophagus ?Providers:                Arta Silence, MD, Jeanella Cara, RN,  ?                          Despina Pole, Technician ?Referring MD:              ?Medicines:                General Anesthesia ?Complications:            No immediate complications. ?Estimated Blood Loss:     Estimated blood loss: none. ?Procedure:                Pre-Anesthesia Assessment: ?                          - Prior to the procedure, a History and Physical  ?                          was performed, and patient medications and  ?                          allergies were reviewed. The patient's tolerance of  ?                          previous anesthesia was also reviewed. The risks  ?                          and benefits of the procedure and the sedation  ?                          options and risks were discussed with the patient.  ?                          All questions were answered, and informed consent  ?                          was obtained. Prior Anticoagulants: The patient has  ?                          taken Eliquis (apixaban), last dose was 1 day prior  ?                          to procedure. ASA Grade Assessment: III - A patient  ?                          with severe systemic disease. After reviewing the  ?  risks and benefits, the patient was deemed in  ?                          satisfactory condition to undergo the procedure. ?                          After obtaining informed consent, the endoscope was  ?                          passed under direct vision. Throughout the  ?                          procedure, the patient's blood pressure, pulse, and  ?                           oxygen saturations were monitored continuously. The  ?                          [id number]-[id number] ([id number]) Olympus endoscope was introduced  ?                          through the mouth, and advanced to the middle third  ?                          of esophagus. The upper GI endoscopy was  ?                          accomplished without difficulty. The patient  ?                          tolerated the procedure well. ?Scope In: ?Scope Out: ?Findings: ?     Food was found in the middle third of the esophagus. Large piece of meat  ?     about 1.5 cm in diameter and 3.5 cm in length was removed. Removal of  ?     food was accomplished. ?     One severe stenosis was found. This stenosis measured 4-5 mm in  ?     diameter. The stenosis was not traversed. Mucosa very friable with  ?     bleeding and mucosal disruption with the slightest of touch. ?Impression:               - Food in the middle third of the esophagus.  ?                          Removal was successful. ?                          - Very tight mid-esophageal stenosis. Could not  ?                          traverse. Couple prior EGDs for same reason with  ?                          pediatric scope passage through stricture without  ?  resistance caused mucosal disruption. ?Moderate Sedation: ?     None ?Recommendation:           - Patient has a contact number available for  ?                          emergencies. The signs and symptoms of potential  ?                          delayed complications were discussed with the  ?                          patient. Return to normal activities tomorrow.  ?                          Written discharge instructions were provided to the  ?                          patient. ?                          - Discharge patient to home (via wheelchair). ?                          - Pureed diet until further notice. No solid foods  ?                          at all. Acceptable foods include Ensure/Boost,  ?                           puddings, plain soups (without  ?                          meat/veggies/fillers), whipped/pureed potatoes,  ?                          plain ice cream (without nuts, chocolate chips or  ?                          other fillers), plain yogurt. ?                          - Continue present medications. ?                          - Return to GI clinic at appointment to be  ?                          scheduled. ?                          - Will discuss options with Dr. Watt Climes as outpatient  ?                          (is she candidate for esophageal stent? Or needs  ?  sequential gentle dilatation? something else?). ?Procedure Code(s):        --- Professional --- ?                          [phone], Esophagoscopy, flexible, transoral; with  ?                          removal of foreign body(s) ?Diagnosis Code(s):        --- Professional --- ?                          M57.846N, Food in esophagus causing other injury,  ?                          initial encounter ?                          K22.2, Esophageal obstruction ?                          T18.108A, Unspecified foreign body in esophagus  ?                          causing other injury, initial encounter ?CPT copyright 2019 American Medical Association. All rights reserved. ?The codes documented in this report are preliminary and upon coder review may  ?be revised to meet current compliance requirements. ?Arta Silence, MD ?02/11/2022 7:59:25 PM ?This report has been signed electronically. ?Number of Addenda: 0 ?

## 2022-02-11 NOTE — ED Provider Triage Note (Signed)
Emergency Medicine Provider Triage Evaluation Note ? ?[name] , a 51 y.o. female  was evaluated in triage.  Pt complains of difficulty swallowing.  Was eating a piece of ham this morning, felt like lodged.  Is unable to swallow salivary secretions.  Denies shortness of breath or dyspnea.  Recently admitted for a similar episode 8 days ago due to previous history of cancer and chronic dysphagia.   ? ?Review of Systems  ?Positive: Dysphagia ?Negative: Shortness of breath ? ?Physical Exam  ?BP (!) 116/91 (BP Location: Right Arm)   Pulse (!) 109   Temp 98.7 ?F (37.1 ?C) (Oral)   Resp 20   Ht 5\' 8"  (1.727 m)   Wt 63.5 kg   LMP 04/24/2018   SpO2 100%   BMI 21.29 kg/m?  ?Gen:   Awake, no distress   ?Resp:  Normal effort, mild wheeze in R lung base ?MSK:   Moves extremities without difficulty  ?Other:  Observed dysphagia when attempting to swallow, pt spitting salivary secretions into emesis bag. ? ?Medical Decision Making  ?Medically screening exam initiated at 3:01 PM.  Appropriate orders placed.  [name] was informed that the remainder of the evaluation will be completed by another provider, this initial triage assessment does not replace that evaluation, and the importance of remaining in the ED until their evaluation is complete. ? ?Labs ordered ?  ?Prince Rome, PA-C ?92/92/44 1508 ? ?

## 2022-02-11 NOTE — ED Provider Notes (Signed)
? ?Emergency Department Provider Note ? ? ?I have reviewed the triage vital signs and the nursing notes. ? ? ?HISTORY ? ?Chief Complaint ?Food bolus ? ? ?HPI ?[name] is a 51 y.o. female with past medical history of lung cancer status postradiation treatment and known esophageal stricture presents with feeling of food stuck in her throat since this morning.  Patient states she was eating ham this morning when she swallowed and felt like it did not go down.  She has since been unable to swallow anything, solid or liquid.  She has a constant burning "fist" like pain in the lower throat.  No shortness of breath.  She is feeling nauseated.  She is noting that she has to spit her saliva into a bag. Denies fever. Had recent admit with EGD for similar issue with [facility] and dilation was aborted due to mucosal tare.  ? ? ?Past Medical History:  ?Diagnosis Date  ? Ankle sprain   ? left  ? Anxiety   ? treated for panic attacks in the past.  ? Asthma   ? COPD (chronic obstructive pulmonary disease) ([place])   ? Depression   ? History of radiation therapy   ? Right lung- 09/26/21-11/13/21- Dr. Gery Pray  ? Irregular heart rate   ? Lung cancer ([place])   ? Renal disorder   ? kidney infections  ? ? ?Review of Systems ? ?Constitutional: No fever/chills ?Eyes: No visual changes. ?ENT: Positive sore throat. ?Cardiovascular: Denies chest pain. ?Respiratory: Denies shortness of breath. ?Gastrointestinal: No abdominal pain. Positive nausea, no vomiting.  No diarrhea.  No constipation. ?Genitourinary: Negative for dysuria. ?Musculoskeletal: Negative for back pain. ?Skin: Negative for rash. ?Neurological: Negative for headaches, focal weakness or numbness. ? ? ?____________________________________________ ? ? ?PHYSICAL EXAM: ? ?VITAL SIGNS: ?ED Triage Vitals  ?Enc Vitals Group  ?   BP 02/11/22 1437 (!) 116/91  ?   Pulse Rate 02/11/22 1437 (!) 109  ?   Resp 02/11/22 1437 20  ?   Temp 02/11/22 1437 98.7 ?F (37.1 ?C)  ?   Temp Source  02/11/22 1437 Oral  ?   SpO2 02/11/22 1437 100 %  ?   Weight 02/11/22 1437 140 lb (63.5 kg)  ?   Height 02/11/22 1437 5\' 8"  (1.727 m)  ? ?Constitutional: Alert and oriented. Well appearing and in no acute distress. Appears generally uncomfortable and holding an emesis bag to spit saliva into.  ?Eyes: Conjunctivae are normal.  ?Head: Atraumatic. ?Nose: No congestion/rhinnorhea. ?Mouth/Throat: Mucous membranes are moist.  Oropharynx non-erythematous. No visible obstruction.  ?Neck: No stridor.  ?Cardiovascular: Tachycardia. Good peripheral circulation. Grossly normal heart sounds.   ?Respiratory: Normal respiratory effort.  No retractions. Lungs CTAB. ?Gastrointestinal: Soft and nontender. No distention.  ?Musculoskeletal: No gross deformities of extremities. ?Neurologic:  Normal speech and language. No gross focal neurologic deficits are appreciated.  ?Skin:  Skin is warm, dry and intact. No rash noted. ? ? ?____________________________________________ ?  ?LABS ?(all labs ordered are listed, but only abnormal results are displayed) ? ?Labs Reviewed  ?COMPREHENSIVE METABOLIC PANEL - Abnormal; Notable for the following components:  ?    Result Value  ? Potassium 3.2 (*)   ? Albumin 3.3 (*)   ? All other components within normal limits  ?CBC WITH DIFFERENTIAL/PLATELET - Abnormal; Notable for the following components:  ? RBC 2.93 (*)   ? Hemoglobin 9.9 (*)   ? HCT 30.2 (*)   ? MCV 103.1 (*)   ? RDW  18.4 (*)   ? Abs Immature Granulocytes 0.12 (*)   ? All other components within normal limits  ?RESP PANEL BY RT-PCR (FLU A&B, COVID) [id number]  ?PROTIME-INR  ? ?____________________________________________ ? ?RADIOLOGY ? ?DG Neck Soft Tissue ? ?Result Date: 02/11/2022 ?CLINICAL DATA:  Possible launch food bolus. EXAM: NECK SOFT TISSUES - 1+ VIEW COMPARISON:  None. FINDINGS: There is no evidence of retropharyngeal soft tissue swelling or epiglottic enlargement. The cervical airway is unremarkable and no radio-opaque foreign body  identified. IMPRESSION: Negative. Electronically Signed   By: Marijo Conception M.D.   On: 02/11/2022 17:03   ? ?____________________________________________ ? ? ?PROCEDURES ? ?Procedure(s) performed:  ? ?Procedures ? ?CRITICAL CARE ?Performed by: Margette Fast ?Total critical care time: 35 minutes ?Critical care time was exclusive of separately billable procedures and treating other patients. ?Critical care was necessary to treat or prevent imminent or life-threatening deterioration. ?Critical care was time spent personally by me on the following activities: development of treatment plan with patient and/or surrogate as well as nursing, discussions with consultants, evaluation of patient's response to treatment, examination of patient, obtaining history from patient or surrogate, ordering and performing treatments and interventions, ordering and review of laboratory studies, ordering and review of radiographic studies, pulse oximetry and re-evaluation of patient's condition. ? ?Nanda Quinton, MD ?Emergency Medicine ? ?____________________________________________ ? ? ?INITIAL IMPRESSION / ASSESSMENT AND PLAN / ED COURSE ? ?Pertinent labs & imaging results that were available during my care of the patient were reviewed by me and considered in my medical decision making (see chart for details). ?  ?This patient is Presenting for Evaluation of food bolus, which does require a range of treatment options, and is a complaint that involves a high risk of morbidity and mortality. ? ?The Differential Diagnoses include food bolus from known stricture, Mallory-Weiss tear, Boerhaave, gastritis. ? ?Critical Interventions-  ?  ?Medications  ?sodium chloride 0.9 % bolus 500 mL (0 mLs Intravenous Stopped 02/11/22 1837)  ?ondansetron (ZOFRAN) injection 4 mg (4 mg Intravenous Given 02/11/22 1645)  ?glucagon (human recombinant) (GLUCAGEN) injection 1 mg (1 mg Intravenous Given 02/11/22 1712)  ?morphine (PF) 4 MG/ML injection 4 mg (4 mg  Intravenous Given 02/11/22 1644)  ? ? ?Reassessment after intervention:  No change in symptoms after Glucagon.  ? ? ?I did obtain Additional Historical Information from family at bedside. ? ?I decided to review pertinent External Data, and in summary patient with last EGD 7 days prior with Dr. Alessandra Bevels. ?  ?Clinical Laboratory Tests Ordered, included  ? ?Radiologic Tests Ordered, included soft tissue neck x-ray. I independently interpreted the images and agree with radiology interpretation.  ? ?Cardiac Monitor Tracing which shows NSR. ? ? ?Social Determinants of Health Risk with prior smoking history.  ? ?Consult complete with ? ?04:30 PM  ?Spoke with Dr. Michail Sermon with GI. Advises to try Glucagon and reassess. Call back if this does not resolve.  ? ?Medical Decision Making: Summary:  ?Patient presents emergency department with signs and symptoms consistent with esophageal food impaction from a known esophageal stricture status post radiation. ? ?05:25 PM  ?No change after Glucagon.  ? ?Reevaluation with update and discussion with patient.  ? ?08:55 PM ?Patient returned from endoscopy suite.  She is feeling much better.  She is tolerating liquids by mouth.  She is awake and alert.  She has a ride home.  Plan for discharge. ? ?Disposition: discharge ? ?____________________________________________ ? ?FINAL CLINICAL IMPRESSION(S) / ED DIAGNOSES ? ?Final diagnoses:  ?Esophageal  obstruction due to food impaction  ? ? ? ?Note:  This document was prepared using Dragon voice recognition software and may include unintentional dictation errors. ? ?Nanda Quinton, MD, FACEP ?Emergency Medicine ? ?  ?Margette Fast, MD ?02/12/22 1228 ? ?

## 2022-02-11 NOTE — Transfer of Care (Signed)
Immediate Anesthesia Transfer of Care Note ? ?Patient: [name] ? ?Procedure(s) Performed: ESOPHAGOGASTRODUODENOSCOPY (EGD) ?FOREIGN BODY REMOVAL ? ?Patient Location: PACU and Endoscopy Unit ? ?Anesthesia Type:General ? ?Level of Consciousness: awake, alert  and oriented ? ?Airway & Oxygen Therapy: Patient Spontanous Breathing ? ?Post-op Assessment: Report given to RN and Post -op Vital signs reviewed and stable ? ?Post vital signs: Reviewed and stable ? ?Last Vitals:  ?Vitals Value Taken Time  ?BP 147/93 02/11/22 1956  ?Temp 36.6 ?C 02/11/22 1954  ?Pulse 114 02/11/22 1957  ?Resp 23 02/11/22 1957  ?SpO2 93 % 02/11/22 1957  ?Vitals shown include unvalidated device data. ? ?Last Pain:  ?Vitals:  ? 02/11/22 1954  ?TempSrc: Temporal  ?PainSc: 9   ?   ? ?  ? ?Complications: No notable events documented. ?

## 2022-02-11 NOTE — ED Triage Notes (Signed)
Pt presents with c/o food bolus. Pt reports hx of lung cancer with radiation several months ago. Pt reports that since the radiation, she has been unable to swallow very well. Pt reports that this morning, she ate a piece of ham and has been unable to swallow even her own saliva since then.  ?

## 2022-02-11 NOTE — Anesthesia Procedure Notes (Signed)
Procedure Name: Intubation ?Date/Time: 02/11/2022 7:31 PM ?Performed by: [place] Reyden Smith C, CRNA ?Pre-anesthesia Checklist: Patient identified, Emergency Drugs available, Suction available and Patient being monitored ?Patient Re-evaluated:Patient Re-evaluated prior to induction ?Oxygen Delivery Method: Circle system utilized ?Preoxygenation: Pre-oxygenation with 100% oxygen ?Induction Type: IV induction, Rapid sequence and Cricoid Pressure applied ?Laryngoscope Size: Mac and 3 ?Grade View: Grade I ?Tube type: Oral ?Tube size: 7.5 mm ?Number of attempts: 1 ?Airway Equipment and Method: Stylet and Oral airway ?Placement Confirmation: ETT inserted through vocal cords under direct vision, positive ETCO2 and breath sounds checked- equal and bilateral ?Secured at: 20 cm ?Tube secured with: Tape ?Dental Injury: Teeth and Oropharynx as per pre-operative assessment  ? ? ? ? ?

## 2022-02-11 NOTE — Discharge Instructions (Signed)
Endoscopy ?Care After ?Please read the instructions outlined below and refer to this sheet in the next few weeks. These discharge instructions provide you with general information on caring for yourself after you leave the hospital. Your doctor may also give you specific instructions. While your treatment has been planned according to the most current medical practices available, unavoidable complications occasionally occur. If you have any problems or questions after discharge, please call Dr. Paulita Fujita [facility]) at [phone]. ? ?HOME CARE INSTRUCTIONS ?Activity ?You may resume your regular activity but move at a slower pace for the next 24 hours.  ?Take frequent rest periods for the next 24 hours.  ?Walking will help expel (get rid of) the air and reduce the bloated feeling in your abdomen.  ?No driving for 24 hours (because of the anesthesia (medicine) used during the test).  ?You may shower.  ?Do not sign any important legal documents or operate any machinery for 24 hours (because of the anesthesia used during the test).  ?Nutrition ?Drink plenty of fluids.  ?Pureed diet only.  ACCEPTABLE foods include yogurt, ice cream, puddings, Ensure/Boost, finely mashed/whipped potatoes, plain soups (without meat/vegetable fillers). ?Do NOT eat pieces of fruit/vegetables, meats (including fish, ham, pork, chicken, steak), breads ?Medications ?You may resume your normal medications unless your caregiver tells you otherwise. ?What you can expect today ?You may experience abdominal discomfort such as a feeling of fullness or "gas" pains.  ?You may experience a sore throat for 2 to 3 days. This is normal. Gargling with salt water may help this.  ? ?SEEK IMMEDIATE MEDICAL CARE IF: ?You have excessive nausea (feeling sick to your stomach) and/or vomiting.  ?You have severe abdominal pain and distention (swelling).  ?You have trouble swallowing.  ?You have a temperature over 100? F (37.8? C).  ?You have rectal  bleeding or vomiting of blood.  ?Document Released: 06/25/2004 Document Revised: 07/24/2011 Document Reviewed: 01/06/2008 ?ExitCare? Patient Information ?[address], [place].  ?

## 2022-02-11 NOTE — Interval H&P Note (Signed)
History and Physical Interval Note: ? ?02/11/2022 ?7:25 PM ? ?[name]  has presented today for surgery, with the diagnosis of food impaction.  The various methods of treatment have been discussed with the patient and family. After consideration of risks, benefits and other options for treatment, the patient has consented to  Procedure(s): ?ESOPHAGOGASTRODUODENOSCOPY (EGD) (N/A) as a surgical intervention.  The patient's history has been reviewed, patient examined, no change in status, stable for surgery.  I have reviewed the patient's chart and labs.  Questions were answered to the patient's satisfaction.   ? ? ?Landry Dyke ? ?Patient history of DVT on Eliquis, history lung cancer with radiation, presents symptoms suspicious for esophageal food impaction.  Prior occurrence about one week ago, has narrow proximal esophageal stricture. ? ?Suggest: ? ? EGD with anticipated esophageal foreign body removal.  Mindful patient is on Eliquis. ?Risks (bleeding, infection, bowel perforation that could require surgery, sedation-related changes in cardiopulmonary systems), benefits (identification and possible treatment of source of symptoms, exclusion of certain causes of symptoms), and alternatives (watchful waiting, radiographic imaging studies, empiric medical treatment) of upper endoscopy (EGD) were explained to patient/family in detail and patient wishes to proceed.  ? ?

## 2022-02-11 NOTE — ED Notes (Signed)
Patient returned from Endo.  Mother at bedside ?

## 2022-02-12 ENCOUNTER — Other Ambulatory Visit (HOSPITAL_BASED_OUTPATIENT_CLINIC_OR_DEPARTMENT_OTHER): Payer: Self-pay

## 2022-02-12 ENCOUNTER — Telehealth: Payer: Self-pay | Admitting: Internal Medicine

## 2022-02-12 ENCOUNTER — Other Ambulatory Visit: Payer: Self-pay | Admitting: Gastroenterology

## 2022-02-12 ENCOUNTER — Inpatient Hospital Stay: Payer: Medicaid Other

## 2022-02-12 ENCOUNTER — Encounter (HOSPITAL_COMMUNITY): Payer: Self-pay | Admitting: Gastroenterology

## 2022-02-12 ENCOUNTER — Other Ambulatory Visit: Payer: Self-pay

## 2022-02-12 NOTE — Telephone Encounter (Signed)
Scheduled appointment per inbasket message. Left message.   ?

## 2022-02-12 NOTE — Anesthesia Postprocedure Evaluation (Signed)
Anesthesia Post Note ? ?Patient: [name] ? ?Procedure(s) Performed: ESOPHAGOGASTRODUODENOSCOPY (EGD) ?FOREIGN BODY REMOVAL ? ?  ? ?Patient location during evaluation: Endoscopy ?Anesthesia Type: General ?Level of consciousness: awake and alert ?Pain management: pain level controlled ?Vital Signs Assessment: post-procedure vital signs reviewed and stable ?Respiratory status: spontaneous breathing, nonlabored ventilation, respiratory function stable and patient connected to nasal cannula oxygen ?Cardiovascular status: blood pressure returned to baseline and stable ?Postop Assessment: no apparent nausea or vomiting ?Anesthetic complications: no ? ? ?No notable events documented. ? ?Last Vitals:  ?Vitals:  ? 02/11/22 2045 02/11/22 2100  ?BP: 113/76 115/79  ?Pulse: 88 89  ?Resp: 18 14  ?Temp:    ?SpO2: 90% 96%  ?  ?Last Pain:  ?Vitals:  ? 02/11/22 2105  ?TempSrc:   ?PainSc: 4   ? ? ?  ?  ?  ?  ?  ?  ? ?March Rummage Natanel Snavely ? ? ? ? ?

## 2022-02-14 ENCOUNTER — Inpatient Hospital Stay: Payer: Medicaid Other | Attending: Radiation Oncology | Admitting: Internal Medicine

## 2022-02-14 ENCOUNTER — Other Ambulatory Visit: Payer: Self-pay

## 2022-02-14 ENCOUNTER — Encounter: Payer: Self-pay | Admitting: Radiology

## 2022-02-14 VITALS — BP 119/90 | HR 127 | Temp 98.5°F | Resp 20 | Ht 68.0 in | Wt 141.8 lb

## 2022-02-14 DIAGNOSIS — Z881 Allergy status to other antibiotic agents status: Secondary | ICD-10-CM | POA: Insufficient documentation

## 2022-02-14 DIAGNOSIS — Z79899 Other long term (current) drug therapy: Secondary | ICD-10-CM | POA: Insufficient documentation

## 2022-02-14 DIAGNOSIS — Z9049 Acquired absence of other specified parts of digestive tract: Secondary | ICD-10-CM | POA: Insufficient documentation

## 2022-02-14 DIAGNOSIS — K222 Esophageal obstruction: Secondary | ICD-10-CM | POA: Insufficient documentation

## 2022-02-14 DIAGNOSIS — I6782 Cerebral ischemia: Secondary | ICD-10-CM | POA: Diagnosis not present

## 2022-02-14 DIAGNOSIS — J439 Emphysema, unspecified: Secondary | ICD-10-CM | POA: Diagnosis not present

## 2022-02-14 DIAGNOSIS — Z7901 Long term (current) use of anticoagulants: Secondary | ICD-10-CM | POA: Insufficient documentation

## 2022-02-14 DIAGNOSIS — C3411 Malignant neoplasm of upper lobe, right bronchus or lung: Secondary | ICD-10-CM | POA: Diagnosis present

## 2022-02-14 DIAGNOSIS — R5383 Other fatigue: Secondary | ICD-10-CM | POA: Insufficient documentation

## 2022-02-14 DIAGNOSIS — Z886 Allergy status to analgesic agent status: Secondary | ICD-10-CM | POA: Diagnosis not present

## 2022-02-14 DIAGNOSIS — Z923 Personal history of irradiation: Secondary | ICD-10-CM | POA: Diagnosis not present

## 2022-02-14 DIAGNOSIS — R590 Localized enlarged lymph nodes: Secondary | ICD-10-CM | POA: Diagnosis not present

## 2022-02-14 NOTE — Progress Notes (Signed)
?    [place] ?[phone]   [phone] ? ?OFFICE PROGRESS NOTE ? ?Pcp, No ?No address on file ? ?DIAGNOSIS: Limited stage (T3, N2, M0) small cell lung cancer presented with right hilar/suprahilar mass with occlusion of the right upper lobe bronchus in addition to right paratracheal, right infra hilar and subcarinal lymphadenopathy as well as additional right upper lobe and right apical pulmonary nodules.  This was diagnosed in October 2022. ? ?PRIOR THERAPY: Systemic chemotherapy with cisplatin 80 Mg/M2 on day 1 and etoposide 100 Mg/M2 on days 1, 2 and 3 every 3 weeks.  First dose October 02, 2021.  This will be concurrent with radiotherapy under the care of Dr. Sondra Come.  Status post 4 cycles.  Last cycle of chemotherapy was given on December 04, 2021 with almost complete response. ? ?CURRENT THERAPY: None ? ?INTERVAL HISTORY: ?[name] 51 y.o. female returns to the clinic today for follow-up visit accompanied by her mother.  The patient continues to have dysphagia.  She was seen by Dr. Paulita Fujita and had upper endoscopy done that showed very tight middle esophageal stenosis.  She is scheduled to see Dr. Watt Climes for consideration of dilatation.  The patient denied having any current chest pain, shortness of breath, cough or hemoptysis.  Unfortunately she continues to smoke and I strongly encouraged her to quit smoking.  She denied having any fever or chills.  She had MRI of the brain that showed no evidence of metastatic disease to the brain.  She was supposed to start prophylactic cranial irradiation but she is holding on proceeding with that for now because of the concern about the side effects that she had with radiation to the chest. ? ?MEDICAL HISTORY: ?Past Medical History:  ?Diagnosis Date  ? Ankle sprain   ? left  ? Anxiety   ? treated for panic attacks in the past.  ? Asthma   ? COPD (chronic obstructive pulmonary disease) ([place])   ? Depression   ? History of radiation  therapy   ? Right lung- 09/26/21-11/13/21- Dr. Gery Pray  ? Irregular heart rate   ? Lung cancer ([place])   ? Renal disorder   ? kidney infections  ? ? ?ALLERGIES:  is allergic to naproxen and bactrim [sulfamethoxazole-trimethoprim]. ? ?MEDICATIONS:  ?Current Outpatient Medications  ?Medication Sig Dispense Refill  ? albuterol (VENTOLIN HFA) 108 (90 Base) MCG/ACT inhaler Inhale 2 puffs into the lungs every 6 (six) hours as needed for wheezing or shortness of breath. 18 g 2  ? apixaban (ELIQUIS) 5 MG TABS tablet Take 1 tablet (5 mg total) by mouth 2 (two) times daily. 60 tablet 1  ? diphenhydramine-acetaminophen (TYLENOL PM) 25-500 MG TABS tablet Take 3 tablets by mouth at bedtime as needed (for sleep).    ? folic acid (FOLVITE) 1 MG tablet Take 1 tablet (1 mg total) by mouth daily. 90 tablet 1  ? guaiFENesin-dextromethorphan (ROBITUSSIN DM) 100-10 MG/5ML syrup Take 5 mLs by mouth every 4 (four) hours as needed for cough.    ? metoprolol tartrate (LOPRESSOR) 25 MG tablet Take 1 tablet (25 mg total) by mouth 2 (two) times daily. 60 tablet 1  ? Multiple Vitamin (MULTIVITAMIN) LIQD Take 15 mLs by mouth daily. 178 mL 1  ? nicotine (NICODERM CQ - DOSED IN MG/24 HOURS) 21 mg/24hr patch Place 1 patch (21 mg total) onto the skin daily. (Patient not taking: Reported on 02/02/2022) 28 patch 0  ? ondansetron (ZOFRAN-ODT) 8 MG disintegrating tablet Dissolve  1 tablet (8 mg total) by mouth every 8 (eight) hours as needed for nausea or vomiting. 30 tablet 0  ? pantoprazole (PROTONIX) 40 MG tablet Take 1 tablet (40 mg total) by mouth 2 (two) times daily. 180 tablet 0  ? sucralfate (CARAFATE) 1 GM/10ML suspension Take 10 mLs (1 g total) by mouth 4 (four) times daily -  with meals and at bedtime. (Patient taking differently: Take 1 g by mouth 2 (two) times daily.) 420 mL 0  ? tiotropium (SPIRIVA HANDIHALER) 18 MCG inhalation capsule Place 1 capsule (18 mcg total) into inhaler and inhale daily. 30 capsule 2  ? ?No current  facility-administered medications for this visit.  ? ? ?SURGICAL HISTORY:  ?Past Surgical History:  ?Procedure Laterality Date  ? APPENDECTOMY    ? BRONCHIAL BIOPSY  09/21/2021  ? Procedure: BRONCHIAL BIOPSIES;  Surgeon: Collene Gobble, MD;  Location: [facility] ENDOSCOPY;  Service: Pulmonary;;  ? BRONCHIAL BRUSHINGS  09/21/2021  ? Procedure: BRONCHIAL BRUSHINGS;  Surgeon: Collene Gobble, MD;  Location: [facility] ENDOSCOPY;  Service: Pulmonary;;  ? BRONCHIAL NEEDLE ASPIRATION BIOPSY  09/21/2021  ? Procedure: BRONCHIAL NEEDLE ASPIRATION BIOPSIES;  Surgeon: Collene Gobble, MD;  Location: [facility] ENDOSCOPY;  Service: Pulmonary;;  ? ESOPHAGOGASTRODUODENOSCOPY N/A 12/26/2021  ? Procedure: ESOPHAGOGASTRODUODENOSCOPY (EGD);  Surgeon: Clarene Essex, MD;  Location: [facility] ENDOSCOPY;  Service: Endoscopy;  Laterality: N/A;  ? ESOPHAGOGASTRODUODENOSCOPY N/A 02/11/2022  ? Procedure: ESOPHAGOGASTRODUODENOSCOPY (EGD);  Surgeon: Arta Silence, MD;  Location: [facility] ENDOSCOPY;  Service: Gastroenterology;  Laterality: N/A;  ? ESOPHAGOGASTRODUODENOSCOPY (EGD) WITH PROPOFOL N/A 01/07/2022  ? Procedure: ESOPHAGOGASTRODUODENOSCOPY (EGD) WITH PROPOFOL;  Surgeon: Otis Brace, MD;  Location: [facility] ENDOSCOPY;  Service: Gastroenterology;  Laterality: N/A;  ? ESOPHAGOGASTRODUODENOSCOPY (EGD) WITH PROPOFOL N/A 02/02/2022  ? Procedure: ESOPHAGOGASTRODUODENOSCOPY (EGD) WITH PROPOFOL;  Surgeon: Ronnette Juniper, MD;  Location: [facility] ENDOSCOPY;  Service: Gastroenterology;  Laterality: N/A;  ? ESOPHAGOGASTRODUODENOSCOPY (EGD) WITH PROPOFOL N/A 02/04/2022  ? Procedure: ESOPHAGOGASTRODUODENOSCOPY (EGD) WITH PROPOFOL;  Surgeon: Otis Brace, MD;  Location: [facility] ENDOSCOPY;  Service: Gastroenterology;  Laterality: N/A;  ? FOREIGN BODY REMOVAL  12/26/2021  ? Procedure: FOREIGN BODY REMOVAL;  Surgeon: Clarene Essex, MD;  Location: [facility] ENDOSCOPY;  Service: Endoscopy;;  ? FOREIGN BODY REMOVAL  02/02/2022  ? Procedure: FOREIGN BODY REMOVAL;  Surgeon: Ronnette Juniper, MD;  Location: [facility] ENDOSCOPY;   Service: Gastroenterology;;  ? FOREIGN BODY REMOVAL  02/11/2022  ? Procedure: FOREIGN BODY REMOVAL;  Surgeon: Arta Silence, MD;  Location: [facility] ENDOSCOPY;  Service: Gastroenterology;;  ? IR CHEST FLUORO  12/27/2021  ? LUMBAR LAMINECTOMY N/A 05/13/2018  ? Procedure: LEFT LUMBAR FOUR-FIVE MICRODISCECTOMY;  Surgeon: Marybelle Killings, MD;  Location: [place];  Service: Orthopedics;  Laterality: N/A;  ? TUBAL LIGATION    ? TUBAL LIGATION  1998  ? VIDEO BRONCHOSCOPY WITH ENDOBRONCHIAL ULTRASOUND N/A 09/21/2021  ? Procedure: VIDEO BRONCHOSCOPY WITH ENDOBRONCHIAL ULTRASOUND;  Surgeon: Collene Gobble, MD;  Location: [facility] ENDOSCOPY;  Service: Pulmonary;  Laterality: N/A;  ? ? ?REVIEW OF SYSTEMS:  A comprehensive review of systems was negative except for: Constitutional: positive for fatigue ?Gastrointestinal: positive for dysphagia  ? ?PHYSICAL EXAMINATION: General appearance: alert, cooperative, fatigued, and no distress ?Head: Normocephalic, without obvious abnormality, atraumatic ?Neck: no adenopathy, no JVD, supple, symmetrical, trachea midline, and thyroid not enlarged, symmetric, no tenderness/mass/nodules ?Lymph nodes: Cervical, supraclavicular, and axillary nodes normal. ?Resp: clear to auscultation bilaterally ?Back: symmetric, no curvature. ROM normal. No CVA tenderness. ?Cardio: regular rate and rhythm, S1, S2 normal, no murmur, click, rub or  gallop ?GI: soft, non-tender; bowel sounds normal; no masses,  no organomegaly ?Extremities: extremities normal, atraumatic, no cyanosis or edema ? ?ECOG PERFORMANCE STATUS: 1 - Symptomatic but completely ambulatory ? ?Blood pressure 119/90, pulse (!) 127, temperature 98.5 ?F (36.9 ?C), temperature source Tympanic, resp. rate 20, height 5\' 8"  (1.727 m), weight 141 lb 12.8 oz (64.3 kg), last menstrual period 04/24/2018, SpO2 98 %. ? ?LABORATORY DATA: ?Lab Results  ?Component Value Date  ? WBC 6.7 02/11/2022  ? HGB 9.9 (L) 02/11/2022  ? HCT 30.2 (L) 02/11/2022  ? MCV 103.1 (H) 02/11/2022   ? PLT 197 02/11/2022  ? ? ?  Chemistry   ?   ?Component Value Date/Time  ? NA 140 02/11/2022 1620  ? NA 139 02/18/2017 0932  ? K 3.2 (L) 02/11/2022 1620  ? CL 104 02/11/2022 1620  ? CO2 28 02/11/2022 1620  ? BUN 8 03/20

## 2022-02-15 ENCOUNTER — Telehealth: Payer: Self-pay | Admitting: Gastroenterology

## 2022-02-15 NOTE — Telephone Encounter (Signed)
She is not a [facility] patient.  She is established with [facility].  ?

## 2022-02-15 NOTE — Progress Notes (Signed)
Patient presented to our office today and left without being seen due to her insurance not being contracted with our office and she did not want to be self pay. Patient is now unassigned for GI care since she has not had an office visit with [facility] ?

## 2022-02-15 NOTE — Telephone Encounter (Signed)
Hi Dr. Candis Schatz, ? ?D.O.D ? ?Patient called seeking a transfer of care from [facility] to you. Patient had an ED visit 02/11/22 for  esophageal obstruction due to food impaction. She was seen by a provider from [facility] at the hospital. Patient states that [facility] does not take her insurance and that is why she seeks transfer of care. Records are on Epic for review. Please advise on scheduling. Thank you.  ?

## 2022-02-26 ENCOUNTER — Ambulatory Visit
Admission: RE | Admit: 2022-02-26 | Discharge: 2022-02-26 | Disposition: A | Discharge status: Home / Self Care | Payer: Medicaid Other | Source: Ambulatory Visit | Attending: Radiation Oncology | Admitting: Radiation Oncology

## 2022-02-26 ENCOUNTER — Other Ambulatory Visit: Payer: Self-pay

## 2022-02-26 DIAGNOSIS — C3411 Malignant neoplasm of upper lobe, right bronchus or lung: Secondary | ICD-10-CM | POA: Insufficient documentation

## 2022-02-27 NOTE — Telephone Encounter (Signed)
I spoke with Dr. Candis Schatz about patient's request for transfer.  Patient will require esophageal dilations at the hospital and due to our limited availability recommended that we assist the patient in establishing with a practice that has hospital endo availability in a timely manner. Next hospital endo openings are in June.  I spoke with Ginger Faupaush of [facility] and they are able to accomodate the patient. I explained the situation to Ms Labree and she is willing to go to [facility] for care.  I provided her the phone number to the practice and asked her to contact them at her very next convenience to set up an appointment.  Ms. Mcbean thanked me for the call and the concern for her.    ?

## 2022-03-01 DIAGNOSIS — C3411 Malignant neoplasm of upper lobe, right bronchus or lung: Secondary | ICD-10-CM | POA: Diagnosis not present

## 2022-03-03 ENCOUNTER — Encounter (HOSPITAL_COMMUNITY): Payer: Self-pay

## 2022-03-03 ENCOUNTER — Emergency Department (HOSPITAL_COMMUNITY): Payer: Medicaid Other | Admitting: Certified Registered Nurse Anesthetist

## 2022-03-03 ENCOUNTER — Encounter (HOSPITAL_COMMUNITY)
Admission: EM | Disposition: A | Discharge status: Home / Self Care | Payer: Self-pay | Source: Home / Self Care | Attending: Internal Medicine

## 2022-03-03 ENCOUNTER — Other Ambulatory Visit: Payer: Self-pay

## 2022-03-03 ENCOUNTER — Inpatient Hospital Stay (HOSPITAL_COMMUNITY)
Admission: EM | Admit: 2022-03-03 | Discharge: 2022-03-07 | DRG: 393 | Disposition: A | Discharge status: Home / Self Care | Payer: Medicaid Other | Attending: Family Medicine | Admitting: Family Medicine

## 2022-03-03 DIAGNOSIS — J449 Chronic obstructive pulmonary disease, unspecified: Secondary | ICD-10-CM

## 2022-03-03 DIAGNOSIS — F32A Depression, unspecified: Secondary | ICD-10-CM | POA: Diagnosis present

## 2022-03-03 DIAGNOSIS — R1314 Dysphagia, pharyngoesophageal phase: Secondary | ICD-10-CM | POA: Diagnosis present

## 2022-03-03 DIAGNOSIS — F17210 Nicotine dependence, cigarettes, uncomplicated: Secondary | ICD-10-CM | POA: Diagnosis present

## 2022-03-03 DIAGNOSIS — I82629 Acute embolism and thrombosis of deep veins of unspecified upper extremity: Secondary | ICD-10-CM | POA: Diagnosis present

## 2022-03-03 DIAGNOSIS — Z86718 Personal history of other venous thrombosis and embolism: Secondary | ICD-10-CM

## 2022-03-03 DIAGNOSIS — E876 Hypokalemia: Secondary | ICD-10-CM | POA: Diagnosis present

## 2022-03-03 DIAGNOSIS — T18128A Food in esophagus causing other injury, initial encounter: Secondary | ICD-10-CM | POA: Diagnosis not present

## 2022-03-03 DIAGNOSIS — I10 Essential (primary) hypertension: Secondary | ICD-10-CM

## 2022-03-03 DIAGNOSIS — D638 Anemia in other chronic diseases classified elsewhere: Secondary | ICD-10-CM | POA: Diagnosis present

## 2022-03-03 DIAGNOSIS — C3411 Malignant neoplasm of upper lobe, right bronchus or lung: Secondary | ICD-10-CM | POA: Diagnosis present

## 2022-03-03 DIAGNOSIS — F419 Anxiety disorder, unspecified: Secondary | ICD-10-CM | POA: Diagnosis present

## 2022-03-03 DIAGNOSIS — I82621 Acute embolism and thrombosis of deep veins of right upper extremity: Secondary | ICD-10-CM | POA: Diagnosis not present

## 2022-03-03 DIAGNOSIS — Y842 Radiological procedure and radiotherapy as the cause of abnormal reaction of the patient, or of later complication, without mention of misadventure at the time of the procedure: Secondary | ICD-10-CM | POA: Diagnosis present

## 2022-03-03 DIAGNOSIS — R1319 Other dysphagia: Secondary | ICD-10-CM

## 2022-03-03 DIAGNOSIS — K222 Esophageal obstruction: Secondary | ICD-10-CM | POA: Diagnosis present

## 2022-03-03 DIAGNOSIS — F418 Other specified anxiety disorders: Secondary | ICD-10-CM

## 2022-03-03 DIAGNOSIS — E871 Hypo-osmolality and hyponatremia: Secondary | ICD-10-CM | POA: Diagnosis present

## 2022-03-03 DIAGNOSIS — T5494XA Toxic effect of unspecified corrosive substance, undetermined, initial encounter: Secondary | ICD-10-CM | POA: Diagnosis present

## 2022-03-03 DIAGNOSIS — Z720 Tobacco use: Secondary | ICD-10-CM

## 2022-03-03 DIAGNOSIS — D649 Anemia, unspecified: Secondary | ICD-10-CM

## 2022-03-03 DIAGNOSIS — Z9851 Tubal ligation status: Secondary | ICD-10-CM

## 2022-03-03 DIAGNOSIS — Z923 Personal history of irradiation: Secondary | ICD-10-CM

## 2022-03-03 DIAGNOSIS — Z8249 Family history of ischemic heart disease and other diseases of the circulatory system: Secondary | ICD-10-CM

## 2022-03-03 DIAGNOSIS — Z9049 Acquired absence of other specified parts of digestive tract: Secondary | ICD-10-CM

## 2022-03-03 DIAGNOSIS — Z882 Allergy status to sulfonamides status: Secondary | ICD-10-CM

## 2022-03-03 DIAGNOSIS — Z79899 Other long term (current) drug therapy: Secondary | ICD-10-CM

## 2022-03-03 DIAGNOSIS — Z85118 Personal history of other malignant neoplasm of bronchus and lung: Secondary | ICD-10-CM

## 2022-03-03 DIAGNOSIS — Z7901 Long term (current) use of anticoagulants: Secondary | ICD-10-CM

## 2022-03-03 DIAGNOSIS — Z888 Allergy status to other drugs, medicaments and biological substances status: Secondary | ICD-10-CM

## 2022-03-03 DIAGNOSIS — K2081 Other esophagitis with bleeding: Secondary | ICD-10-CM | POA: Diagnosis present

## 2022-03-03 HISTORY — PX: FOREIGN BODY REMOVAL: SHX962

## 2022-03-03 HISTORY — PX: ESOPHAGOGASTRODUODENOSCOPY: SHX5428

## 2022-03-03 LAB — CBC
HCT: 35.7 % — ABNORMAL LOW (ref 36.0–46.0)
Hemoglobin: 12 g/dL (ref 12.0–15.0)
MCH: 35 pg — ABNORMAL HIGH (ref 26.0–34.0)
MCHC: 33.6 g/dL (ref 30.0–36.0)
MCV: 104.1 fL — ABNORMAL HIGH (ref 80.0–100.0)
Platelets: 259 10*3/uL (ref 150–400)
RBC: 3.43 MIL/uL — ABNORMAL LOW (ref 3.87–5.11)
RDW: 15.6 % — ABNORMAL HIGH (ref 11.5–15.5)
WBC: 7.3 10*3/uL (ref 4.0–10.5)
nRBC: 0 % (ref 0.0–0.2)

## 2022-03-03 LAB — BASIC METABOLIC PANEL
Anion gap: 9 (ref 5–15)
BUN: 11 mg/dL (ref 6–20)
CO2: 20 mmol/L — ABNORMAL LOW (ref 22–32)
Calcium: 10.2 mg/dL (ref 8.9–10.3)
Chloride: 110 mmol/L (ref 98–111)
Creatinine, Ser: 1.14 mg/dL — ABNORMAL HIGH (ref 0.44–1.00)
GFR, Estimated: 59 mL/min — ABNORMAL LOW (ref >60)
Glucose, Bld: 99 mg/dL (ref 70–99)
Potassium: 2.9 mmol/L — ABNORMAL LOW (ref 3.5–5.1)
Sodium: 139 mmol/L (ref 135–145)

## 2022-03-03 SURGERY — EGD (ESOPHAGOGASTRODUODENOSCOPY)
Anesthesia: General

## 2022-03-03 MED ORDER — ACETAMINOPHEN 325 MG PO TABS: 650.0000 mg | ORAL_TABLET | Freq: Four times a day (QID) | ORAL | Status: DC | PRN

## 2022-03-03 MED ORDER — ONDANSETRON HCL 4 MG/2ML IJ SOLN
4.0000 mg | Freq: Once | INTRAMUSCULAR | Status: AC
  Administered 2022-03-03: 4 mg via INTRAVENOUS
  Filled 2022-03-03: qty 2

## 2022-03-03 MED ORDER — LIDOCAINE HCL (CARDIAC) PF 100 MG/5ML IV SOSY
PREFILLED_SYRINGE | INTRAVENOUS | Status: DC | PRN
  Administered 2022-03-03: 100 mg via INTRAVENOUS

## 2022-03-03 MED ORDER — NALOXONE HCL 0.4 MG/ML IJ SOLN: 0.4000 mg | INTRAMUSCULAR | Status: DC | PRN

## 2022-03-03 MED ORDER — FENTANYL CITRATE (PF) 100 MCG/2ML IJ SOLN
INTRAMUSCULAR | Status: DC | PRN
  Administered 2022-03-03: 100 ug via INTRAVENOUS

## 2022-03-03 MED ORDER — GLUCAGON HCL RDNA (DIAGNOSTIC) 1 MG IJ SOLR
1.0000 mg | Freq: Once | INTRAMUSCULAR | Status: AC
  Administered 2022-03-03: 1 mg via INTRAVENOUS
  Filled 2022-03-03: qty 1

## 2022-03-03 MED ORDER — POTASSIUM CHLORIDE 10 MEQ/100ML IV SOLN
10.0000 meq | INTRAVENOUS | Status: AC
  Administered 2022-03-03 – 2022-03-04 (×3): 10 meq via INTRAVENOUS
  Filled 2022-03-03 (×3): qty 100

## 2022-03-03 MED ORDER — PANTOPRAZOLE SODIUM 40 MG IV SOLR
40.0000 mg | Freq: Two times a day (BID) | INTRAVENOUS | Status: DC
  Administered 2022-03-03 – 2022-03-07 (×7): 40 mg via INTRAVENOUS
  Filled 2022-03-03 (×7): qty 10

## 2022-03-03 MED ORDER — MIDAZOLAM HCL 2 MG/2ML IJ SOLN
INTRAMUSCULAR | Status: DC | PRN
  Administered 2022-03-03: 2 mg via INTRAVENOUS

## 2022-03-03 MED ORDER — DEXAMETHASONE SODIUM PHOSPHATE 10 MG/ML IJ SOLN
INTRAMUSCULAR | Status: DC | PRN
Start: 2022-03-03 — End: 2022-03-03
  Administered 2022-03-03: 5 mg via INTRAVENOUS

## 2022-03-03 MED ORDER — PHENYLEPHRINE 40 MCG/ML (10ML) SYRINGE FOR IV PUSH (FOR BLOOD PRESSURE SUPPORT)
PREFILLED_SYRINGE | INTRAVENOUS | Status: DC | PRN
  Administered 2022-03-03 (×3): 200 ug via INTRAVENOUS

## 2022-03-03 MED ORDER — PHENYLEPHRINE HCL (PRESSORS) 10 MG/ML IV SOLN
INTRAVENOUS | Status: AC
  Filled 2022-03-03: qty 1

## 2022-03-03 MED ORDER — LORAZEPAM 2 MG/ML IJ SOLN
1.0000 mg | Freq: Once | INTRAMUSCULAR | Status: AC
  Administered 2022-03-03: 1 mg via INTRAVENOUS
  Filled 2022-03-03: qty 1

## 2022-03-03 MED ORDER — ACETAMINOPHEN 650 MG RE SUPP: 650.0000 mg | Freq: Four times a day (QID) | RECTAL | Status: DC | PRN

## 2022-03-03 MED ORDER — SODIUM CHLORIDE 0.9 % IV SOLN: INTRAVENOUS | Status: DC

## 2022-03-03 MED ORDER — LACTATED RINGERS IV SOLN
INTRAVENOUS | Status: AC | PRN
  Administered 2022-03-03: 20 mL/h via INTRAVENOUS

## 2022-03-03 MED ORDER — ONDANSETRON HCL 4 MG/2ML IJ SOLN
4.0000 mg | Freq: Four times a day (QID) | INTRAMUSCULAR | Status: DC | PRN
  Administered 2022-03-04 – 2022-03-07 (×7): 4 mg via INTRAVENOUS
  Filled 2022-03-03 (×6): qty 2

## 2022-03-03 MED ORDER — FENTANYL CITRATE PF 50 MCG/ML IJ SOSY
50.0000 ug | PREFILLED_SYRINGE | Freq: Once | INTRAMUSCULAR | Status: AC
  Administered 2022-03-03: 50 ug via INTRAVENOUS
  Filled 2022-03-03: qty 1

## 2022-03-03 MED ORDER — PROPOFOL 500 MG/50ML IV EMUL
INTRAVENOUS | Status: AC
  Filled 2022-03-03: qty 50

## 2022-03-03 MED ORDER — PROPOFOL 10 MG/ML IV BOLUS
INTRAVENOUS | Status: DC | PRN
  Administered 2022-03-03: 200 mg via INTRAVENOUS

## 2022-03-03 MED ORDER — ROCURONIUM BROMIDE 10 MG/ML (PF) SYRINGE
PREFILLED_SYRINGE | INTRAVENOUS | Status: DC | PRN
  Administered 2022-03-03: 10 mg via INTRAVENOUS

## 2022-03-03 MED ORDER — SUGAMMADEX SODIUM 200 MG/2ML IV SOLN
INTRAVENOUS | Status: DC | PRN
  Administered 2022-03-03: 100 mg via INTRAVENOUS

## 2022-03-03 MED ORDER — FENTANYL CITRATE PF 50 MCG/ML IJ SOSY
50.0000 ug | PREFILLED_SYRINGE | INTRAMUSCULAR | Status: DC | PRN
  Administered 2022-03-04 – 2022-03-06 (×26): 50 ug via INTRAVENOUS
  Filled 2022-03-03 (×25): qty 1

## 2022-03-03 MED ORDER — PANTOPRAZOLE SODIUM 40 MG IV SOLR
40.0000 mg | Freq: Two times a day (BID) | INTRAVENOUS | Status: DC
Start: 2022-03-04 — End: 2022-03-03

## 2022-03-03 MED ORDER — FENTANYL CITRATE (PF) 100 MCG/2ML IJ SOLN
INTRAMUSCULAR | Status: AC
  Filled 2022-03-03: qty 2

## 2022-03-03 MED ORDER — POTASSIUM CHLORIDE 10 MEQ/100ML IV SOLN: 10.0000 meq | INTRAVENOUS | Status: AC

## 2022-03-03 MED ORDER — MIDAZOLAM HCL 2 MG/2ML IJ SOLN
INTRAMUSCULAR | Status: AC
Start: 2022-03-03 — End: ?
  Filled 2022-03-03: qty 2

## 2022-03-03 MED ORDER — LACTATED RINGERS IV SOLN: INTRAVENOUS | Status: DC

## 2022-03-03 MED ORDER — ONDANSETRON HCL 4 MG/2ML IJ SOLN
INTRAMUSCULAR | Status: DC | PRN
  Administered 2022-03-03: 4 mg via INTRAVENOUS

## 2022-03-03 MED ORDER — SUCCINYLCHOLINE CHLORIDE 200 MG/10ML IV SOSY
PREFILLED_SYRINGE | INTRAVENOUS | Status: DC | PRN
  Administered 2022-03-03: 120 mg via INTRAVENOUS

## 2022-03-03 NOTE — ED Notes (Signed)
Transported to Endo ? ?

## 2022-03-03 NOTE — ED Provider Notes (Signed)
The patient returns from the endoscopy suite after successful disimpaction of the food bolus.  I spoke to the gastroenterologist who recommended overnight observation, given the patient's overall discomfort and concern for esophageal bleeding noted during exam.  GI team will reassess her in the morning. ?  ?Wyvonnia Dusky, MD ?03/03/22 2045 ? ?

## 2022-03-03 NOTE — Op Note (Addendum)
[facility] ?Patient Name: [name] ?Procedure Date: 03/03/2022 ?MRN: [id number] ?Attending MD: Lear Ng , MD ?Date of Birth: 1971/02/15 ?CSN: [id number] ?Age: 51 ?Admit Type: Outpatient ?Procedure:                Upper GI endoscopy ?Indications:              Foreign body in the esophagus, Stricture of the  ?                          esophagus ?Providers:                Lear Ng, MD, Grace Isaac, RN, Cindee Salt  ?                          Teaching laboratory technician ?Referring MD:             ER ?Medicines:                General Anesthesia ?Complications:            Hemorrhage ?Estimated Blood Loss:     Estimated blood loss was minimal. ?Procedure:                Pre-Anesthesia Assessment: ?                          - Prior to the procedure, a History and Physical  ?                          was performed, and patient medications and  ?                          allergies were reviewed. The patient's tolerance of  ?                          previous anesthesia was also reviewed. The risks  ?                          and benefits of the procedure and the sedation  ?                          options and risks were discussed with the patient.  ?                          All questions were answered, and informed consent  ?                          was obtained. Prior Anticoagulants: The patient has  ?                          taken Eliquis (apixaban), last dose was 1 day prior  ?                          to procedure. ASA Grade Assessment: III - A patient  ?  with severe systemic disease. After reviewing the  ?                          risks and benefits, the patient was deemed in  ?                          satisfactory condition to undergo the procedure. ?                          After obtaining informed consent, the endoscope was  ?                          passed under direct vision. Throughout the  ?                          procedure, the patient's blood pressure,  pulse, and  ?                          oxygen saturations were monitored continuously. The  ?                          GIF-[id number] ([id number]) Olympus endoscope was introduced  ?                          through the mouth, and advanced to the middle third  ?                          of esophagus. The upper GI endoscopy was  ?                          accomplished without difficulty. The patient  ?                          tolerated the procedure well. ?Scope In: ?Scope Out: ?Findings: ?     Food was found in the mid esophagus. Removal of food was accomplished.  ?     One pass of a talon grasper removed the entire food bolus revealing a  ?     tight ulcerated mid-esophageal stricture. Trauma from the food bolus was  ?     noted with bleeding following removal of the food bolus. The stricture  ?     was not traversed with the standard endoscope. ?     One benign-appearing, intrinsic severe (stenosis; an endoscope cannot  ?     pass) ulcerated stenosis was found in the mid esophagus. The stenosis  ?     was not traversed. The lesion was not amenable to dilation, and this was  ?     not attempted. ?     Could not evaluate distal esophagus, stomach, or duodenum due to the  ?     tight stricture. ?     Severe esophagitis with bleeding was found in the mid esophagus. ?Impression:               - Food in the mid esophagus. Removal was successful. ?                          -  Benign-appearing esophageal stenosis. Lesion not  ?                          amenable to dilation, and not attempted. ?                          - Severe esophagitis with bleeding. ?Moderate Sedation: ?     Not Applicable - Patient had care per Anesthesia. ?Recommendation:           - Admit the patient to hospital ward for  ?                          observation. ?                          - NPO. ?                          - Observe patient's clinical course. ?                          - Hold Eliquis. IVFs. Admit for observation. EGD  ?                           with dilation in the near future. NPO. PPI IV Q 12  ?                          hours. ?Procedure Code(s):        --- Professional --- ?                          [id number], Esophagoscopy, flexible, transoral; with  ?                          removal of foreign body(s) ?Diagnosis Code(s):        --- Professional --- ?                          C14.481E, Food in esophagus causing other injury,  ?                          initial encounter ?                          K22.2, Esophageal obstruction ?                          K20.91, Esophagitis, unspecified with bleeding ?                          T18.108A, Unspecified foreign body in esophagus  ?                          causing other injury, initial encounter ?CPT copyright 2019 American Medical Association. All rights reserved. ?The codes documented in this report are preliminary and upon coder review may  ?be revised to meet current compliance requirements. ?Lear Ng, MD ?03/03/2022 8:21:03 PM ?  This report has been signed electronically. ?Number of Addenda: 0 ?

## 2022-03-03 NOTE — Consult Note (Signed)
Referring Provider: Dr. Langston Masker ?Primary Care Physician:  Pcp, No ?Primary Gastroenterologist:  Dr. Watt Climes ? ?Reason for Consultation:  Food Impaction ? ?HPI: [name] is a 51 y.o. female with history of radiation-induced stricture and previous food impactions had the sensation of food hanging up after the 2nd bite of [place] for Easter dinner. Had some emesis afterwards but has not been able to tolerate liquids. Tolerating saliva in ER. S/P Glucagon in ER. At her last EGD 02/11/22 food was noted in the mid-esophagus above a very tight mid-esophageal stenosis that could not be traversed with the standard endoscope. Passage of the endoscope during EGD 02/04/22 caused mucosal disruption and dilation was not done. She states she was supposed to be on a soft diet and thought the [place] was soft. +Heartburn. On Eliquis (last dose this morning). ? ?Past Medical History:  ?Diagnosis Date  ? Ankle sprain   ? left  ? Anxiety   ? treated for panic attacks in the past.  ? Asthma   ? COPD (chronic obstructive pulmonary disease) ([place])   ? Depression   ? History of radiation therapy   ? Right lung- 09/26/21-11/13/21- Dr. Gery Pray  ? Irregular heart rate   ? Lung cancer ([place])   ? Renal disorder   ? kidney infections  ? ? ?Past Surgical History:  ?Procedure Laterality Date  ? APPENDECTOMY    ? BRONCHIAL BIOPSY  09/21/2021  ? Procedure: BRONCHIAL BIOPSIES;  Surgeon: Collene Gobble, MD;  Location: [facility] ENDOSCOPY;  Service: Pulmonary;;  ? BRONCHIAL BRUSHINGS  09/21/2021  ? Procedure: BRONCHIAL BRUSHINGS;  Surgeon: Collene Gobble, MD;  Location: [facility] ENDOSCOPY;  Service: Pulmonary;;  ? BRONCHIAL NEEDLE ASPIRATION BIOPSY  09/21/2021  ? Procedure: BRONCHIAL NEEDLE ASPIRATION BIOPSIES;  Surgeon: Collene Gobble, MD;  Location: [facility] ENDOSCOPY;  Service: Pulmonary;;  ? ESOPHAGOGASTRODUODENOSCOPY N/A 12/26/2021  ? Procedure: ESOPHAGOGASTRODUODENOSCOPY (EGD);  Surgeon: Clarene Essex, MD;  Location: [facility] ENDOSCOPY;  Service: Endoscopy;  Laterality:  N/A;  ? ESOPHAGOGASTRODUODENOSCOPY N/A 02/11/2022  ? Procedure: ESOPHAGOGASTRODUODENOSCOPY (EGD);  Surgeon: Arta Silence, MD;  Location: [facility] ENDOSCOPY;  Service: Gastroenterology;  Laterality: N/A;  ? ESOPHAGOGASTRODUODENOSCOPY (EGD) WITH PROPOFOL N/A 01/07/2022  ? Procedure: ESOPHAGOGASTRODUODENOSCOPY (EGD) WITH PROPOFOL;  Surgeon: Otis Brace, MD;  Location: [facility] ENDOSCOPY;  Service: Gastroenterology;  Laterality: N/A;  ? ESOPHAGOGASTRODUODENOSCOPY (EGD) WITH PROPOFOL N/A 02/02/2022  ? Procedure: ESOPHAGOGASTRODUODENOSCOPY (EGD) WITH PROPOFOL;  Surgeon: Ronnette Juniper, MD;  Location: [facility] ENDOSCOPY;  Service: Gastroenterology;  Laterality: N/A;  ? ESOPHAGOGASTRODUODENOSCOPY (EGD) WITH PROPOFOL N/A 02/04/2022  ? Procedure: ESOPHAGOGASTRODUODENOSCOPY (EGD) WITH PROPOFOL;  Surgeon: Otis Brace, MD;  Location: [facility] ENDOSCOPY;  Service: Gastroenterology;  Laterality: N/A;  ? FOREIGN BODY REMOVAL  12/26/2021  ? Procedure: FOREIGN BODY REMOVAL;  Surgeon: Clarene Essex, MD;  Location: [facility] ENDOSCOPY;  Service: Endoscopy;;  ? FOREIGN BODY REMOVAL  02/02/2022  ? Procedure: FOREIGN BODY REMOVAL;  Surgeon: Ronnette Juniper, MD;  Location: [facility] ENDOSCOPY;  Service: Gastroenterology;;  ? FOREIGN BODY REMOVAL  02/11/2022  ? Procedure: FOREIGN BODY REMOVAL;  Surgeon: Arta Silence, MD;  Location: [facility] ENDOSCOPY;  Service: Gastroenterology;;  ? IR CHEST FLUORO  12/27/2021  ? LUMBAR LAMINECTOMY N/A 05/13/2018  ? Procedure: LEFT LUMBAR FOUR-FIVE MICRODISCECTOMY;  Surgeon: Marybelle Killings, MD;  Location: [place];  Service: Orthopedics;  Laterality: N/A;  ? TUBAL LIGATION    ? TUBAL LIGATION  1998  ? VIDEO BRONCHOSCOPY WITH ENDOBRONCHIAL ULTRASOUND N/A 09/21/2021  ? Procedure: VIDEO BRONCHOSCOPY WITH ENDOBRONCHIAL ULTRASOUND;  Surgeon: Collene Gobble,  MD;  Location: [facility] ENDOSCOPY;  Service: Pulmonary;  Laterality: N/A;  ? ? ?Prior to Admission medications   ?Medication Sig Start Date End Date Taking? Authorizing Provider  ?albuterol (VENTOLIN HFA) 108 (90  Base) MCG/ACT inhaler Inhale 2 puffs into the lungs every 6 (six) hours as needed for wheezing or shortness of breath. 02/08/22   Mercy Riding, MD  ?apixaban (ELIQUIS) 5 MG TABS tablet Take 1 tablet (5 mg total) by mouth 2 (two) times daily. 02/08/22   Mercy Riding, MD  ?diphenhydramine-acetaminophen (TYLENOL PM) 25-500 MG TABS tablet Take 3 tablets by mouth at bedtime as needed (for sleep).    [provider]  ?folic acid (FOLVITE) 1 MG tablet Take 1 tablet (1 mg total) by mouth daily. 02/09/22   Mercy Riding, MD  ?guaiFENesin-dextromethorphan (ROBITUSSIN DM) 100-10 MG/5ML syrup Take 5 mLs by mouth every 4 (four) hours as needed for cough.    [provider]  ?metoprolol tartrate (LOPRESSOR) 25 MG tablet Take 1 tablet (25 mg total) by mouth 2 (two) times daily. 02/08/22   Mercy Riding, MD  ?Multiple Vitamin (MULTIVITAMIN) LIQD Take 15 mLs by mouth daily. 02/08/22   Mercy Riding, MD  ?nicotine (NICODERM CQ - DOSED IN MG/24 HOURS) 21 mg/24hr patch Place 1 patch (21 mg total) onto the skin daily. ?Patient not taking: Reported on 02/02/2022 12/28/21   Barb Merino, MD  ?ondansetron (ZOFRAN-ODT) 8 MG disintegrating tablet Dissolve 1 tablet (8 mg total) by mouth every 8 (eight) hours as needed for nausea or vomiting. 02/08/22   Mercy Riding, MD  ?pantoprazole (PROTONIX) 40 MG tablet Take 1 tablet (40 mg total) by mouth 2 (two) times daily. 02/08/22 05/09/22  Mercy Riding, MD  ?sucralfate (CARAFATE) 1 GM/10ML suspension Take 10 mLs (1 g total) by mouth 4 (four) times daily -  with meals and at bedtime. ?Patient taking differently: Take 1 g by mouth 2 (two) times daily. 01/24/22   Gery Pray, MD  ?tiotropium (SPIRIVA HANDIHALER) 18 MCG inhalation capsule Place 1 capsule (18 mcg total) into inhaler and inhale daily. 02/08/22 05/09/22  Mercy Riding, MD  ? ? ?Scheduled Meds: ? [MAR Hold] fentaNYL (SUBLIMAZE) injection  50 mcg Intravenous Once  ? ?Continuous Infusions: ? sodium chloride    ? lactated ringers 20  mL/hr (03/03/22 0715)  ? [MAR Hold] potassium chloride    ? ?PRN Meds:.lactated ringers ? ?Allergies as of 03/03/2022 - Review Complete 03/03/2022  ?Allergen Reaction Noted  ? Naproxen Nausea Only 09/10/2021  ? Bactrim [sulfamethoxazole-trimethoprim] Nausea Only 04/06/2012  ? ? ?Family History  ?Problem Relation Age of Onset  ? Hypertension Mother   ? Hypertension Father   ? Heart disease Father   ? ? ?Social History  ? ?Socioeconomic History  ? Marital status: Divorced  ?  Spouse name: Not on file  ? Number of children: Not on file  ? Years of education: Not on file  ? Highest education level: Not on file  ?Occupational History  ? Not on file  ?Tobacco Use  ? Smoking status: Every Day  ?  Packs/day: 0.50  ?  Years: 30.00  ?  Pack years: 15.00  ?  Types: Cigarettes  ? Smokeless tobacco: Never  ?Vaping Use  ? Vaping Use: Some days  ? Substances: Flavoring  ?Substance and Sexual Activity  ? Alcohol use: Not Currently  ?  Comment: rare  ? Drug use: Not Currently  ?  Types: Marijuana  ?  Comment: occ-  ? Sexual activity: Not on file  ?Other Topics Concern  ? Not on file  ?Social History Narrative  ? Not on file  ? ?Social Determinants of Health  ? ?Financial Resource Strain: Not on file  ?Food Insecurity: Not on file  ?Transportation Needs: Not on file  ?Physical Activity: Not on file  ?Stress: Not on file  ?Social Connections: Not on file  ?Intimate Partner Violence: Not on file  ? ? ?Review of Systems: All negative except as stated above in HPI. ? ?Physical Exam: ?Vital signs: ?Vitals:  ? 03/03/22 1815 03/03/22 1903  ?BP: 116/78 105/88  ?Pulse: (!) 110 (!) 113  ?Resp: (!) 27 (!) 31  ?Temp:  97.9 ?F (36.6 ?C)  ?SpO2: 97% 94%  ? ?  ?General:  Lethargic, chronic ill-appearing, no acute distress, thin ?Head: normocephalic, atraumatic ?Eyes: anicteric sclera ?ENT: oropharynx clear ?Neck: supple, nontender ?Lungs:  Clear throughout to auscultation.   No wheezes, crackles, or rhonchi. No acute distress. ?Heart:  Regular rate  and rhythm; no murmurs, clicks, rubs,  or gallops. ?Abdomen: soft, nontender, nondistended, +BS  ?Rectal:  Deferred ?Ext: no edema ? ?GI:  ?Lab Results: ?Recent Labs  ?  03/03/22 ?1741  ?WBC 7.3  ?

## 2022-03-03 NOTE — Anesthesia Preprocedure Evaluation (Signed)
Anesthesia Evaluation  ?Patient identified by MRN, date of birth, ID band ?Patient awake ? ? ? ?Reviewed: ?Allergy & Precautions, NPO status , Patient's Chart, lab work & pertinent test results ? ?Airway ?Mallampati: II ? ?TM Distance: >3 FB ?Neck ROM: Full ? ? ? Dental ? ?(+) Partial Upper, Missing,  ?  ?Pulmonary ?asthma , COPD,  COPD inhaler, Current Smoker and Patient abstained from smoking.,  ?H/o lung cancer: XRT R lung ?  ?Pulmonary exam normal ? ? ? ? ? ? ? Cardiovascular ?hypertension, Pt. on home beta blockers ?Normal cardiovascular exam ? ?11/2021 ECHO: EF 60-65%. The LV has normal function,  no regional wall motion abnormalities. Left ventricular diastolic parameters were normal. RVF is normal, no significant valvular abnormalities, pericardial effusion anterior to RV ?  ?Neuro/Psych ?PSYCHIATRIC DISORDERS Anxiety Depression   ? GI/Hepatic ?GERD  Medicated and Controlled,Radiation induced esophagitis ?  ?Endo/Other  ? ? Renal/GU ?  ? ?  ?Musculoskeletal ? ? Abdominal ?Normal abdominal exam  (+)   ?Peds ? Hematology ?eliquis   ?Anesthesia Other Findings ? ? Reproductive/Obstetrics ? ?  ? ? ? ? ? ? ? ? ? ? ? ? ? ?  ?  ? ? ? ? ? ? ? ? ?Anesthesia Physical ? ?Anesthesia Plan ? ?ASA: 3 ? ?Anesthesia Plan: General  ? ?Post-op Pain Management:   ? ?Induction: Intravenous, Rapid sequence and Cricoid pressure planned ? ?PONV Risk Score and Plan: 2 and Ondansetron, Dexamethasone, Treatment may vary due to age or medical condition and Midazolam ? ?Airway Management Planned: Oral ETT ? ?Additional Equipment: None ? ?Intra-op Plan:  ? ?Post-operative Plan: Extubation in OR ? ?Informed Consent: I have reviewed the patients History and Physical, chart, labs and discussed the procedure including the risks, benefits and alternatives for the proposed anesthesia with the patient or authorized representative who has indicated his/her understanding and acceptance.  ? ? ? ?Dental advisory  given ? ?Plan Discussed with: CRNA ? ?Anesthesia Plan Comments: (Lab Results ?     Component                Value               Date                 ?     WBC                      6.7                 02/11/2022           ?     HGB                      9.9 (L)             02/11/2022           ?     HCT                      30.2 (L)            02/11/2022           ?     MCV                      103.1 (H)           02/11/2022           ?  PLT                      197                 02/11/2022           ?Lab Results ?     Component                Value               Date                 ?     NA                       140                 02/11/2022           ?     K                        3.2 (L)             02/11/2022           ?     CO2                      28                  02/11/2022           ?     GLUCOSE                  80                  02/11/2022           ?     BUN                      8                   02/11/2022           ?     CREATININE               0.92                02/11/2022           ?     CALCIUM                  9.3                 02/11/2022           ?     GFRNONAA                 >60                 02/11/2022          )  ? ? ? ? ? ? ?Anesthesia Quick Evaluation ? ?

## 2022-03-03 NOTE — Anesthesia Procedure Notes (Signed)
Procedure Name: Intubation ?Date/Time: 03/03/2022 7:56 PM ?Performed by: Raenette Rover, CRNA ?Pre-anesthesia Checklist: Patient identified, Emergency Drugs available, Suction available and Patient being monitored ?Patient Re-evaluated:Patient Re-evaluated prior to induction ?Oxygen Delivery Method: Circle system utilized ?Preoxygenation: Pre-oxygenation with 100% oxygen ?Induction Type: IV induction, Cricoid Pressure applied and Rapid sequence ?Ventilation: Mask ventilation without difficulty ?Laryngoscope Size: Mac and 3 ?Grade View: Grade I ?Tube type: Oral ?Tube size: 7.0 mm ?Number of attempts: 1 ?Airway Equipment and Method: Stylet ?Placement Confirmation: ETT inserted through vocal cords under direct vision, positive ETCO2 and breath sounds checked- equal and bilateral ?Secured at: 21 cm ?Tube secured with: Tape ?Dental Injury: Teeth and Oropharynx as per pre-operative assessment  ? ? ? ? ?

## 2022-03-03 NOTE — Interval H&P Note (Signed)
History and Physical Interval Note: ? ?03/03/2022 ?7:54 PM ? ?[name]  has presented today for surgery, with the diagnosis of food impaction.  The various methods of treatment have been discussed with the patient and family. After consideration of risks, benefits and other options for treatment, the patient has consented to  Procedure(s): ?ESOPHAGOGASTRODUODENOSCOPY (EGD) (N/A) ?FOREIGN BODY REMOVAL (N/A) as a surgical intervention.  The patient's history has been reviewed, patient examined, no change in status, stable for surgery.  I have reviewed the patient's chart and labs.  Questions were answered to the patient's satisfaction.   ? ? ?Lear Ng ? ? ?

## 2022-03-03 NOTE — Brief Op Note (Signed)
03/03/2022 ? ?8:35 PM ? ?PATIENT:  [name]  51 y.o. female ? ?PRE-OPERATIVE DIAGNOSIS:  food impaction; Esophageal stricture ? ?POST-OPERATIVE DIAGNOSIS:  food bolus removed with talon; High-grade esophageal stricture ? ?PROCEDURE:  Procedure(s): ?ESOPHAGOGASTRODUODENOSCOPY (EGD) (N/A) ?FOREIGN BODY REMOVAL (N/A) ? ?SURGEON:  Surgeon(s) and Role: ?   Wilford Corner, MD - Primary ? ?Food bolus present in mid-esophagus and removed with one pass of talon grasper revealing a high-grade ulcerative mid-esophageal stricture with red blood noted. Stricture was not traversed with the standard endoscope. See procedure note for complete findings and recs. ? ?Plan: Transfer back to ER for admit to observation by hospitalist; NPO; IVFs; IV PPI Q 12 hours; Hold Eliquis; Dilation in the near future; Consider esophageal stent in the near future; Updated Dr. Langston Masker, patient, and patient's mother. [facility] will f/u tomorrow. ?

## 2022-03-03 NOTE — ED Provider Notes (Signed)
?[facility] DEPT ?Provider Note ? ? ?CSN: [id number] ?Arrival date & time: 03/03/22  1706 ? ?  ? ?History ? ?Chief Complaint  ?Patient presents with  ? Dysphagia  ? ? ?[name] is a 51 y.o. 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  Patient reports that she was eating [place] approximately 4 hours ago and feels that it got stuck in her lower chest.  She has been gagging since then, small episodes of vomiting.  She can swallow slowly, but if she tries to drink she vomits it up afterwards.  She says it feels similar to prior impactions that she has had, including 1 month ago where she was admitted to the hospital. ? ?She underwent upper endoscopy approximately 1 month ago on March 13, which showed esophagitis with esophageal stenosis and esophageal stricture.  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  She is not currently on radiation, is planned to restart treatments in 1 week. ? ?She sees [facility] as an outpatient and had plans for esophageal stenting or dilation, but was told because "they don't take my insurance.", they would not be able to treat her.  She was subsequently referred to [facility] but has not establish care with them yet, and told "they are not doing any procedures or hospital care for the next 2 months." ? ?HPI ? ?  ? ?Home Medications ?Prior to Admission medications   ?Medication Sig Start Date End Date Taking? Authorizing Provider  ?albuterol (VENTOLIN HFA) 108 (90 Base) MCG/ACT inhaler Inhale 2 puffs into the lungs every 6 (six) hours as needed for wheezing or shortness of breath. 02/08/22   Mercy Riding, MD  ?apixaban (ELIQUIS) 5 MG TABS tablet Take 1 tablet (5 mg total) by mouth 2 (two) times daily. 02/08/22   Mercy Riding, MD  ?diphenhydramine-acetaminophen (TYLENOL PM) 25-500 MG TABS tablet Take 3  tablets by mouth at bedtime as needed (for sleep).    [provider]  ?folic acid (FOLVITE) 1 MG tablet Take 1 tablet (1 mg total) by mouth daily. 02/09/22   Mercy Riding, MD  ?guaiFENesin-dextromethorphan (ROBITUSSIN DM) 100-10 MG/5ML syrup Take 5 mLs by mouth every 4 (four) hours as needed for cough.    [provider]  ?metoprolol tartrate (LOPRESSOR) 25 MG tablet Take 1 tablet (25 mg total) by mouth 2 (two) times daily. 02/08/22   Mercy Riding, MD  ?Multiple Vitamin (MULTIVITAMIN) LIQD Take 15 mLs by mouth daily. 02/08/22   Mercy Riding, MD  ?nicotine (NICODERM CQ - DOSED IN MG/24 HOURS) 21 mg/24hr patch Place 1 patch (21 mg total) onto the skin daily. ?Patient not taking: Reported on 02/02/2022 12/28/21   Barb Merino, MD  ?ondansetron (ZOFRAN-ODT) 8 MG disintegrating tablet Dissolve 1 tablet (8 mg total) by mouth every 8 (eight) hours as needed for nausea or vomiting. 02/08/22   Mercy Riding, MD  ?pantoprazole (PROTONIX) 40 MG tablet Take 1 tablet (40 mg total) by mouth 2 (two) times daily. 02/08/22 05/09/22  Mercy Riding, MD  ?sucralfate (CARAFATE) 1 GM/10ML suspension Take 10 mLs (1 g total) by mouth 4 (four) times daily -  with meals and at bedtime. ?Patient taking differently: Take 1 g by mouth 2 (two) times daily. 01/24/22   Gery Pray, MD  ?tiotropium (SPIRIVA HANDIHALER) 18 MCG inhalation capsule Place 1  capsule (18 mcg total) into inhaler and inhale daily. 02/08/22 05/09/22  Mercy Riding, MD  ?   ? ?Allergies    ?Naproxen and Bactrim [sulfamethoxazole-trimethoprim]   ? ?Review of Systems   ?Review of Systems ? ?Physical Exam ?Updated Vital Signs ?BP 105/88   Pulse (!) 113   Temp 97.9 ?F (36.6 ?C) (Temporal)   Resp (!) 31   LMP 04/24/2018   SpO2 94%  ?Physical Exam ?Constitutional:   ?   General: She is not in acute distress. ?HENT:  ?   Head: Normocephalic and atraumatic.  ?Eyes:  ?   Conjunctiva/sclera: Conjunctivae normal.  ?   Pupils: Pupils are equal, round, and reactive to  light.  ?Cardiovascular:  ?   Rate and Rhythm: Normal rate and regular rhythm.  ?Pulmonary:  ?   Effort: Pulmonary effort is normal. No respiratory distress.  ?Abdominal:  ?   General: There is no distension.  ?   Tenderness: There is no abdominal tenderness.  ?Skin: ?   General: Skin is warm and dry.  ?Neurological:  ?   General: No focal deficit present.  ?   Mental Status: She is alert. Mental status is at baseline.  ?Psychiatric:     ?   Mood and Affect: Mood normal.     ?   Behavior: Behavior normal.  ? ? ?ED Results / Procedures / Treatments   ?Labs ?(all labs ordered are listed, but only abnormal results are displayed) ?Labs Reviewed  ?BASIC METABOLIC PANEL - Abnormal; Notable for the following components:  ?    Result Value  ? Potassium 2.9 (*)   ? CO2 20 (*)   ? Creatinine, Ser 1.14 (*)   ? GFR, Estimated 59 (*)   ? All other components within normal limits  ?CBC - Abnormal; Notable for the following components:  ? RBC 3.43 (*)   ? HCT 35.7 (*)   ? MCV 104.1 (*)   ? MCH 35.0 (*)   ? RDW 15.6 (*)   ? All other components within normal limits  ? ? ?EKG ?None ? ?Radiology ?No results found. ? ?Procedures ?Procedures  ? ? ?Medications Ordered in ED ?Medications  ?fentaNYL (SUBLIMAZE) injection 50 mcg ( Intravenous MAR Hold 03/03/22 1900)  ?potassium chloride 10 mEq in 100 mL IVPB ( Intravenous Automatically Held 03/03/22 1945)  ?0.9 %  sodium chloride infusion (has no administration in time range)  ?lactated ringers infusion (20 mL/hr Intravenous New Bag/Given 03/03/22 0715)  ?LORazepam (ATIVAN) injection 1 mg (1 mg Intravenous Given 03/03/22 1754)  ?glucagon (human recombinant) (GLUCAGEN) injection 1 mg (1 mg Intravenous Given 03/03/22 1755)  ?ondansetron (ZOFRAN) injection 4 mg (4 mg Intravenous Given 03/03/22 1754)  ? ? ?ED Course/ Medical Decision Making/ A&P ?Clinical Course as of 03/03/22 1933  ?Nancy Fetter Mar 03, 2022  ?1753 I spoke to Dr Michail Sermon from [facility] - they will plan for endoscopy today, he will come evaluate  patient. [MT]  ?1753 Pt confirms last dose of eliquis was last night, not this morning. [MT]  ?  ?Clinical Course User Index ?[MT] Wyvonnia Dusky, MD  ? ?                        ?Medical Decision Making ?Amount and/or Complexity of Data Reviewed ?Labs: ordered. ? ?Risk ?Prescription drug management. ? ? ?Patient is here with suspected food bolus impaction versus globus sensation after choking on a piece of [place] today.  She has known esophageal stricture.  I personally reviewed her external records including her most recent EGD and gastroenterology evaluation from last month. ? ?On exam she is tolerating her saliva, difficulty swallowing water as she seems to choke on this more.  She is able to speak with a normal voice.  Her airway appears intact.  She does appear uncomfortable overall.  IV Ativan, glucagon, and Zofran ordered for anxiety and nausea. ? ?Labs also reviewed showing some mild developing hypokalemia which is likely dietary, I have ordered repletion with IV potassium given her difficulty with swallowing. ? ?GI consulted as noted in ED course, they are planning for endoscopy for food impaction. ? ?Supplemental history provided by the patient's mother at the bedside ? ? ? ? ? ? ? ?Final Clinical Impression(s) / ED Diagnoses ?Final diagnoses:  ?Food impaction of esophagus, initial encounter  ? ? ?Rx / DC Orders ?ED Discharge Orders   ? ? None  ? ?  ? ? ?  ?Wyvonnia Dusky, MD ?03/03/22 1933 ? ?

## 2022-03-03 NOTE — ED Triage Notes (Addendum)
Pt arrived via POV, c/o food bolus, states hx of same. States she was eating earlier this afternoon, having issues swallowing since. Able to swallow small amts of salvia, spitting out the rest of secretions. Painful to swallow.  ?

## 2022-03-03 NOTE — Transfer of Care (Signed)
Immediate Anesthesia Transfer of Care Note ? ?Patient: [name] ? ?Procedure(s) Performed: ESOPHAGOGASTRODUODENOSCOPY (EGD) ?FOREIGN BODY REMOVAL ? ?Patient Location: Endoscopy Unit ? ?Anesthesia Type:General ? ?Level of Consciousness: awake, alert , oriented and patient cooperative ? ?Airway & Oxygen Therapy: Patient Spontanous Breathing ? ?Post-op Assessment: Report given to RN and Post -op Vital signs reviewed and stable ? ?Post vital signs: Reviewed and stable--pt coughing in endo pacu ? ?Last Vitals:  ?Vitals Value Taken Time  ?BP 113/81   ?Temp 36.6 ?C 03/03/22 2018  ?Pulse 131 03/03/22 2023  ?Resp 26 03/03/22 2023  ?SpO2 88-92 % 03/03/22 2023  ?Vitals shown include unvalidated device data. ? ?Last Pain:  ?Vitals:  ? 03/03/22 2018  ?TempSrc: Temporal  ?PainSc:   ?   ? ?  ? ?Complications: No notable events documented. ?

## 2022-03-03 NOTE — H&P (View-Only) (Signed)
Referring Provider: Dr. Langston Masker ?Primary Care Physician:  Pcp, No ?Primary Gastroenterologist:  Dr. Watt Climes ? ?Reason for Consultation:  Food Impaction ? ?HPI: [name] is a 51 y.o. female with history of radiation-induced stricture and previous food impactions had the sensation of food hanging up after the 2nd bite of [place] for Easter dinner. Had some emesis afterwards but has not been able to tolerate liquids. Tolerating saliva in ER. S/P Glucagon in ER. At her last EGD 02/11/22 food was noted in the mid-esophagus above a very tight mid-esophageal stenosis that could not be traversed with the standard endoscope. Passage of the endoscope during EGD 02/04/22 caused mucosal disruption and dilation was not done. She states she was supposed to be on a soft diet and thought the [place] was soft. +Heartburn. On Eliquis (last dose this morning). ? ?Past Medical History:  ?Diagnosis Date  ? Ankle sprain   ? left  ? Anxiety   ? treated for panic attacks in the past.  ? Asthma   ? COPD (chronic obstructive pulmonary disease) ([place])   ? Depression   ? History of radiation therapy   ? Right lung- 09/26/21-11/13/21- Dr. Gery Pray  ? Irregular heart rate   ? Lung cancer ([place])   ? Renal disorder   ? kidney infections  ? ? ?Past Surgical History:  ?Procedure Laterality Date  ? APPENDECTOMY    ? BRONCHIAL BIOPSY  09/21/2021  ? Procedure: BRONCHIAL BIOPSIES;  Surgeon: Collene Gobble, MD;  Location: [facility] ENDOSCOPY;  Service: Pulmonary;;  ? BRONCHIAL BRUSHINGS  09/21/2021  ? Procedure: BRONCHIAL BRUSHINGS;  Surgeon: Collene Gobble, MD;  Location: [facility] ENDOSCOPY;  Service: Pulmonary;;  ? BRONCHIAL NEEDLE ASPIRATION BIOPSY  09/21/2021  ? Procedure: BRONCHIAL NEEDLE ASPIRATION BIOPSIES;  Surgeon: Collene Gobble, MD;  Location: [facility] ENDOSCOPY;  Service: Pulmonary;;  ? ESOPHAGOGASTRODUODENOSCOPY N/A 12/26/2021  ? Procedure: ESOPHAGOGASTRODUODENOSCOPY (EGD);  Surgeon: Clarene Essex, MD;  Location: [facility] ENDOSCOPY;  Service: Endoscopy;  Laterality:  N/A;  ? ESOPHAGOGASTRODUODENOSCOPY N/A 02/11/2022  ? Procedure: ESOPHAGOGASTRODUODENOSCOPY (EGD);  Surgeon: Arta Silence, MD;  Location: [facility] ENDOSCOPY;  Service: Gastroenterology;  Laterality: N/A;  ? ESOPHAGOGASTRODUODENOSCOPY (EGD) WITH PROPOFOL N/A 01/07/2022  ? Procedure: ESOPHAGOGASTRODUODENOSCOPY (EGD) WITH PROPOFOL;  Surgeon: Otis Brace, MD;  Location: [facility] ENDOSCOPY;  Service: Gastroenterology;  Laterality: N/A;  ? ESOPHAGOGASTRODUODENOSCOPY (EGD) WITH PROPOFOL N/A 02/02/2022  ? Procedure: ESOPHAGOGASTRODUODENOSCOPY (EGD) WITH PROPOFOL;  Surgeon: Ronnette Juniper, MD;  Location: [facility] ENDOSCOPY;  Service: Gastroenterology;  Laterality: N/A;  ? ESOPHAGOGASTRODUODENOSCOPY (EGD) WITH PROPOFOL N/A 02/04/2022  ? Procedure: ESOPHAGOGASTRODUODENOSCOPY (EGD) WITH PROPOFOL;  Surgeon: Otis Brace, MD;  Location: [facility] ENDOSCOPY;  Service: Gastroenterology;  Laterality: N/A;  ? FOREIGN BODY REMOVAL  12/26/2021  ? Procedure: FOREIGN BODY REMOVAL;  Surgeon: Clarene Essex, MD;  Location: [facility] ENDOSCOPY;  Service: Endoscopy;;  ? FOREIGN BODY REMOVAL  02/02/2022  ? Procedure: FOREIGN BODY REMOVAL;  Surgeon: Ronnette Juniper, MD;  Location: [facility] ENDOSCOPY;  Service: Gastroenterology;;  ? FOREIGN BODY REMOVAL  02/11/2022  ? Procedure: FOREIGN BODY REMOVAL;  Surgeon: Arta Silence, MD;  Location: [facility] ENDOSCOPY;  Service: Gastroenterology;;  ? IR CHEST FLUORO  12/27/2021  ? LUMBAR LAMINECTOMY N/A 05/13/2018  ? Procedure: LEFT LUMBAR FOUR-FIVE MICRODISCECTOMY;  Surgeon: Marybelle Killings, MD;  Location: [facility];  Service: Orthopedics;  Laterality: N/A;  ? TUBAL LIGATION    ? TUBAL LIGATION  1998  ? VIDEO BRONCHOSCOPY WITH ENDOBRONCHIAL ULTRASOUND N/A 09/21/2021  ? Procedure: VIDEO BRONCHOSCOPY WITH ENDOBRONCHIAL ULTRASOUND;  Surgeon: Collene Gobble,  MD;  Location: [facility] ENDOSCOPY;  Service: Pulmonary;  Laterality: N/A;  ? ? ?Prior to Admission medications   ?Medication Sig Start Date End Date Taking? Authorizing Provider  ?albuterol (VENTOLIN HFA) 108 (90  Base) MCG/ACT inhaler Inhale 2 puffs into the lungs every 6 (six) hours as needed for wheezing or shortness of breath. 02/08/22   Mercy Riding, MD  ?apixaban (ELIQUIS) 5 MG TABS tablet Take 1 tablet (5 mg total) by mouth 2 (two) times daily. 02/08/22   Mercy Riding, MD  ?diphenhydramine-acetaminophen (TYLENOL PM) 25-500 MG TABS tablet Take 3 tablets by mouth at bedtime as needed (for sleep).    [provider]  ?folic acid (FOLVITE) 1 MG tablet Take 1 tablet (1 mg total) by mouth daily. 02/09/22   Mercy Riding, MD  ?guaiFENesin-dextromethorphan (ROBITUSSIN DM) 100-10 MG/5ML syrup Take 5 mLs by mouth every 4 (four) hours as needed for cough.    [provider]  ?metoprolol tartrate (LOPRESSOR) 25 MG tablet Take 1 tablet (25 mg total) by mouth 2 (two) times daily. 02/08/22   Mercy Riding, MD  ?Multiple Vitamin (MULTIVITAMIN) LIQD Take 15 mLs by mouth daily. 02/08/22   Mercy Riding, MD  ?nicotine (NICODERM CQ - DOSED IN MG/24 HOURS) 21 mg/24hr patch Place 1 patch (21 mg total) onto the skin daily. ?Patient not taking: Reported on 02/02/2022 12/28/21   Barb Merino, MD  ?ondansetron (ZOFRAN-ODT) 8 MG disintegrating tablet Dissolve 1 tablet (8 mg total) by mouth every 8 (eight) hours as needed for nausea or vomiting. 02/08/22   Mercy Riding, MD  ?pantoprazole (PROTONIX) 40 MG tablet Take 1 tablet (40 mg total) by mouth 2 (two) times daily. 02/08/22 05/09/22  Mercy Riding, MD  ?sucralfate (CARAFATE) 1 GM/10ML suspension Take 10 mLs (1 g total) by mouth 4 (four) times daily -  with meals and at bedtime. ?Patient taking differently: Take 1 g by mouth 2 (two) times daily. 01/24/22   Gery Pray, MD  ?tiotropium (SPIRIVA HANDIHALER) 18 MCG inhalation capsule Place 1 capsule (18 mcg total) into inhaler and inhale daily. 02/08/22 05/09/22  Mercy Riding, MD  ? ? ?Scheduled Meds: ? [MAR Hold] fentaNYL (SUBLIMAZE) injection  50 mcg Intravenous Once  ? ?Continuous Infusions: ? sodium chloride    ? lactated ringers 20  mL/hr (03/03/22 0715)  ? [MAR Hold] potassium chloride    ? ?PRN Meds:.lactated ringers ? ?Allergies as of 03/03/2022 - Review Complete 03/03/2022  ?Allergen Reaction Noted  ? Naproxen Nausea Only 09/10/2021  ? Bactrim [sulfamethoxazole-trimethoprim] Nausea Only 04/06/2012  ? ? ?Family History  ?Problem Relation Age of Onset  ? Hypertension Mother   ? Hypertension Father   ? Heart disease Father   ? ? ?Social History  ? ?Socioeconomic History  ? Marital status: Divorced  ?  Spouse name: Not on file  ? Number of children: Not on file  ? Years of education: Not on file  ? Highest education level: Not on file  ?Occupational History  ? Not on file  ?Tobacco Use  ? Smoking status: Every Day  ?  Packs/day: 0.50  ?  Years: 30.00  ?  Pack years: 15.00  ?  Types: Cigarettes  ? Smokeless tobacco: Never  ?Vaping Use  ? Vaping Use: Some days  ? Substances: Flavoring  ?Substance and Sexual Activity  ? Alcohol use: Not Currently  ?  Comment: rare  ? Drug use: Not Currently  ?  Types: Marijuana  ?  Comment: occ-  ? Sexual activity: Not on file  ?Other Topics Concern  ? Not on file  ?Social History Narrative  ? Not on file  ? ?Social Determinants of Health  ? ?Financial Resource Strain: Not on file  ?Food Insecurity: Not on file  ?Transportation Needs: Not on file  ?Physical Activity: Not on file  ?Stress: Not on file  ?Social Connections: Not on file  ?Intimate Partner Violence: Not on file  ? ? ?Review of Systems: All negative except as stated above in HPI. ? ?Physical Exam: ?Vital signs: ?Vitals:  ? 03/03/22 1815 03/03/22 1903  ?BP: 116/78 105/88  ?Pulse: (!) 110 (!) 113  ?Resp: (!) 27 (!) 31  ?Temp:  97.9 ?F (36.6 ?C)  ?SpO2: 97% 94%  ? ?  ?General:  Lethargic, chronic ill-appearing, no acute distress, thin ?Head: normocephalic, atraumatic ?Eyes: anicteric sclera ?ENT: oropharynx clear ?Neck: supple, nontender ?Lungs:  Clear throughout to auscultation.   No wheezes, crackles, or rhonchi. No acute distress. ?Heart:  Regular rate  and rhythm; no murmurs, clicks, rubs,  or gallops. ?Abdomen: soft, nontender, nondistended, +BS  ?Rectal:  Deferred ?Ext: no edema ? ?GI:  ?Lab Results: ?Recent Labs  ?  03/03/22 ?1741  ?WBC 7.3  ?

## 2022-03-04 ENCOUNTER — Encounter (HOSPITAL_COMMUNITY): Payer: Self-pay | Admitting: Gastroenterology

## 2022-03-04 DIAGNOSIS — I10 Essential (primary) hypertension: Secondary | ICD-10-CM | POA: Diagnosis not present

## 2022-03-04 DIAGNOSIS — K222 Esophageal obstruction: Secondary | ICD-10-CM | POA: Diagnosis present

## 2022-03-04 DIAGNOSIS — F32A Depression, unspecified: Secondary | ICD-10-CM | POA: Diagnosis present

## 2022-03-04 DIAGNOSIS — J449 Chronic obstructive pulmonary disease, unspecified: Secondary | ICD-10-CM | POA: Diagnosis present

## 2022-03-04 DIAGNOSIS — E871 Hypo-osmolality and hyponatremia: Secondary | ICD-10-CM | POA: Diagnosis present

## 2022-03-04 DIAGNOSIS — Z882 Allergy status to sulfonamides status: Secondary | ICD-10-CM | POA: Diagnosis not present

## 2022-03-04 DIAGNOSIS — Z8249 Family history of ischemic heart disease and other diseases of the circulatory system: Secondary | ICD-10-CM | POA: Diagnosis not present

## 2022-03-04 DIAGNOSIS — I82621 Acute embolism and thrombosis of deep veins of right upper extremity: Secondary | ICD-10-CM | POA: Diagnosis not present

## 2022-03-04 DIAGNOSIS — F17210 Nicotine dependence, cigarettes, uncomplicated: Secondary | ICD-10-CM | POA: Diagnosis present

## 2022-03-04 DIAGNOSIS — Z888 Allergy status to other drugs, medicaments and biological substances status: Secondary | ICD-10-CM | POA: Diagnosis not present

## 2022-03-04 DIAGNOSIS — R1314 Dysphagia, pharyngoesophageal phase: Secondary | ICD-10-CM | POA: Diagnosis present

## 2022-03-04 DIAGNOSIS — D649 Anemia, unspecified: Secondary | ICD-10-CM | POA: Diagnosis present

## 2022-03-04 DIAGNOSIS — Z79899 Other long term (current) drug therapy: Secondary | ICD-10-CM | POA: Diagnosis not present

## 2022-03-04 DIAGNOSIS — Y842 Radiological procedure and radiotherapy as the cause of abnormal reaction of the patient, or of later complication, without mention of misadventure at the time of the procedure: Secondary | ICD-10-CM | POA: Diagnosis present

## 2022-03-04 DIAGNOSIS — Z7901 Long term (current) use of anticoagulants: Secondary | ICD-10-CM | POA: Diagnosis not present

## 2022-03-04 DIAGNOSIS — F419 Anxiety disorder, unspecified: Secondary | ICD-10-CM | POA: Diagnosis present

## 2022-03-04 DIAGNOSIS — Z923 Personal history of irradiation: Secondary | ICD-10-CM | POA: Diagnosis not present

## 2022-03-04 DIAGNOSIS — T18128A Food in esophagus causing other injury, initial encounter: Secondary | ICD-10-CM | POA: Diagnosis present

## 2022-03-04 DIAGNOSIS — K2081 Other esophagitis with bleeding: Secondary | ICD-10-CM | POA: Diagnosis present

## 2022-03-04 DIAGNOSIS — T286XXA Corrosion of esophagus, initial encounter: Secondary | ICD-10-CM | POA: Diagnosis not present

## 2022-03-04 DIAGNOSIS — Z9851 Tubal ligation status: Secondary | ICD-10-CM | POA: Diagnosis not present

## 2022-03-04 DIAGNOSIS — Z9049 Acquired absence of other specified parts of digestive tract: Secondary | ICD-10-CM | POA: Diagnosis not present

## 2022-03-04 DIAGNOSIS — E876 Hypokalemia: Secondary | ICD-10-CM | POA: Diagnosis present

## 2022-03-04 DIAGNOSIS — T5494XA Toxic effect of unspecified corrosive substance, undetermined, initial encounter: Secondary | ICD-10-CM | POA: Diagnosis present

## 2022-03-04 DIAGNOSIS — Z85118 Personal history of other malignant neoplasm of bronchus and lung: Secondary | ICD-10-CM | POA: Diagnosis not present

## 2022-03-04 DIAGNOSIS — D638 Anemia in other chronic diseases classified elsewhere: Secondary | ICD-10-CM | POA: Diagnosis present

## 2022-03-04 DIAGNOSIS — Z86718 Personal history of other venous thrombosis and embolism: Secondary | ICD-10-CM | POA: Diagnosis not present

## 2022-03-04 LAB — CBC WITH DIFFERENTIAL/PLATELET
Abs Immature Granulocytes: 0.06 10*3/uL (ref 0.00–0.07)
Basophils Absolute: 0 10*3/uL (ref 0.0–0.1)
Basophils Relative: 0 %
Eosinophils Absolute: 0 10*3/uL (ref 0.0–0.5)
Eosinophils Relative: 0 %
HCT: 31.7 % — ABNORMAL LOW (ref 36.0–46.0)
Hemoglobin: 10.3 g/dL — ABNORMAL LOW (ref 12.0–15.0)
Immature Granulocytes: 1 %
Lymphocytes Relative: 9 %
Lymphs Abs: 0.5 10*3/uL — ABNORMAL LOW (ref 0.7–4.0)
MCH: 34.3 pg — ABNORMAL HIGH (ref 26.0–34.0)
MCHC: 32.5 g/dL (ref 30.0–36.0)
MCV: 105.7 fL — ABNORMAL HIGH (ref 80.0–100.0)
Monocytes Absolute: 0.1 10*3/uL (ref 0.1–1.0)
Monocytes Relative: 1 %
Neutro Abs: 5.1 10*3/uL (ref 1.7–7.7)
Neutrophils Relative %: 89 %
Platelets: 229 10*3/uL (ref 150–400)
RBC: 3 MIL/uL — ABNORMAL LOW (ref 3.87–5.11)
RDW: 15.4 % (ref 11.5–15.5)
WBC: 5.8 10*3/uL (ref 4.0–10.5)
nRBC: 0 % (ref 0.0–0.2)

## 2022-03-04 LAB — COMPREHENSIVE METABOLIC PANEL
ALT: 12 U/L (ref 0–44)
AST: 19 U/L (ref 15–41)
Albumin: 3.7 g/dL (ref 3.5–5.0)
Alkaline Phosphatase: 53 U/L (ref 38–126)
Anion gap: 5 (ref 5–15)
BUN: 10 mg/dL (ref 6–20)
CO2: 19 mmol/L — ABNORMAL LOW (ref 22–32)
Calcium: 9.3 mg/dL (ref 8.9–10.3)
Chloride: 109 mmol/L (ref 98–111)
Creatinine, Ser: 0.77 mg/dL (ref 0.44–1.00)
GFR, Estimated: >60 mL/min (ref >60)
Glucose, Bld: 131 mg/dL — ABNORMAL HIGH (ref 70–99)
Potassium: 4.8 mmol/L (ref 3.5–5.1)
Sodium: 133 mmol/L — ABNORMAL LOW (ref 135–145)
Total Bilirubin: 0.9 mg/dL (ref 0.3–1.2)
Total Protein: 6.6 g/dL (ref 6.5–8.1)

## 2022-03-04 LAB — HEPARIN LEVEL (UNFRACTIONATED)
Heparin Unfractionated: <0.1 IU/mL — ABNORMAL LOW (ref 0.30–0.70)
Heparin Unfractionated: 0.34 IU/mL (ref 0.30–0.70)

## 2022-03-04 LAB — MAGNESIUM: Magnesium: 1.7 mg/dL (ref 1.7–2.4)

## 2022-03-04 LAB — PROTIME-INR
INR: 1.1 (ref 0.8–1.2)
Prothrombin Time: 13.8 seconds (ref 11.4–15.2)

## 2022-03-04 LAB — APTT: aPTT: 32 seconds (ref 24–36)

## 2022-03-04 MED ORDER — FLUTICASONE PROPIONATE 50 MCG/ACT NA SUSP
1.0000 | Freq: Every day | NASAL | Status: AC
  Administered 2022-03-04 – 2022-03-05 (×2): 1 via NASAL
  Filled 2022-03-04: qty 16

## 2022-03-04 MED ORDER — ALBUTEROL SULFATE (2.5 MG/3ML) 0.083% IN NEBU: 2.5000 mg | INHALATION_SOLUTION | RESPIRATORY_TRACT | Status: DC | PRN

## 2022-03-04 MED ORDER — SUCRALFATE 1 GM/10ML PO SUSP
1.0000 g | Freq: Three times a day (TID) | ORAL | Status: DC
  Administered 2022-03-04 – 2022-03-06 (×7): 1 g via ORAL
  Filled 2022-03-04 (×8): qty 10

## 2022-03-04 MED ORDER — PHENOL 1.4 % MT LIQD
1.0000 | OROMUCOSAL | Status: DC | PRN
  Administered 2022-03-04: 1 via OROMUCOSAL
  Filled 2022-03-04: qty 177

## 2022-03-04 MED ORDER — HEPARIN (PORCINE) 25000 UT/250ML-% IV SOLN
1100.0000 [IU]/h | INTRAVENOUS | Status: AC
  Administered 2022-03-04 – 2022-03-05 (×2): 1100 [IU]/h via INTRAVENOUS
  Filled 2022-03-04 (×2): qty 250

## 2022-03-04 MED ORDER — NICOTINE 14 MG/24HR TD PT24
14.0000 mg | MEDICATED_PATCH | Freq: Every day | TRANSDERMAL | Status: DC | PRN
Start: 2022-03-04 — End: 2022-03-07
  Administered 2022-03-05 – 2022-03-06 (×2): 14 mg via TRANSDERMAL
  Filled 2022-03-04 (×2): qty 1

## 2022-03-04 MED ORDER — ORAL CARE MOUTH RINSE
15.0000 mL | Freq: Two times a day (BID) | OROMUCOSAL | Status: DC
  Administered 2022-03-04 – 2022-03-07 (×5): 15 mL via OROMUCOSAL

## 2022-03-04 MED ORDER — APIXABAN 5 MG PO TABS: 5.0000 mg | ORAL_TABLET | Freq: Two times a day (BID) | ORAL | Status: DC

## 2022-03-04 NOTE — H&P (Signed)
?History and Physical  ? ? ?PLEASE NOTE THAT [employer]AGON [employer] SOFTWARE WAS USED IN THE CONSTRUCTION OF THIS NOTE. ? ? ?[name] HKV:[id number] DOB: Apr 29, 1971 DOA: 03/03/2022 ? ?PCP: Pcp, No (will further assess) ?Patient coming from: home  ? ?I have personally briefly reviewed patient's old medical records in [place] ? ?Chief Complaint: Food impaction ? ?HPI: [name] is a 51 y.o. 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  ? ?The patient reports that she was consuming [place] earlier today when some of the [place] "got stuck" in her esophagus while attempting to swallow.  She attempted consuming water to flush the trachea down her esophagus, but was unsuccessful in doing so, prompting her to present to [facility] emergency department for further evaluation management of suspected food impaction.  She denies any associated shortness of breath, cough, stridor.  ? ?This is in the context of a history of right upper lobe lung cancer for which she underwent radiation therapy in November and December 2022.  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  EGD performed on 02/02/2022 as well as 02/04/2022 reportedly showed esophagitis consistent with this process as well as esophageal stenosis, with evidence of mucosal tear.  As a consequence of the presence of mucosa tear, dilation of the esophagus was not performed.  Per chart review, it appears that gastroenterology recommended PPI and Carafate at that time as well as a soft diet.  The patient conveys that she is no longer  taking her prescribed Protonix 40 mg p.o. twice daily. ? ?Per chart review, she has a history of chronic anemia, with baseline hemoglobin range 8.5-12, with most recent prior hemoglobin noted to be 9.9 on 02/11/2022.  She also has a history of acute DVT diagnosed in February 2023, for which she is being anticoagulated on Eliquis.  Otherwise, not on a blood thinners as an outpatient, including no aspirin. ? ? ? ?ED Course:  ?Vital signs in the ED were notable for the following: Afebrile; heart rate 112; blood pressure 105/88 - 116/78; respiratory rate 20-21, oxygen saturation 94 to 100% on room air. ? ?Labs were notable for the following: BMP notable for the following: Potassium 2.9, creatinine 1.14 compared to most recent prior serum creatinine data point of 0.92 on 02/11/2022, glucose 99.  CBC notable for a blood cell count 7300, hemoglobin 12, platelet count 259. ? ?EDP consulted on-call equal gastroenterologist, Dr. Michail Sermon, Who performed EGD with food disimpaction.  Dr.SchoolerNoted a small amount of esophageal bleeding at the site of the disimpaction, without evidence of esophageal perforation.  Consequently, Dr.Schooler Recommended overnight observation to the hospitalist service for further evaluation management of presenting food impaction.  He conveys that gastroenterology will continue to follow, and see the patient again in the morning (03/04/22). ? ?While in the ED, the following were administered: Fentanyl 50 mcg IV x1, Ativan 1 mg IV x1, Zofran 4 mg IV x1. ? ?Subsequently, the patient was admitted for overnight observation for further evaluation management of food impaction, with presenting labs also notable for hypokalemia. ? ? ? ?Review of Systems: As per HPI otherwise 10 point review of systems negative.  ? ?  Past Medical History:  ?Diagnosis Date  ? Ankle sprain   ? left  ? Anxiety   ? treated for panic attacks in the past.  ? Asthma   ? COPD (chronic obstructive pulmonary disease) ([place])   ?  Depression   ? History of radiation therapy   ? Right lung- 09/26/21-11/13/21- Dr. Gery Pray  ? Irregular heart rate   ? Lung cancer ([place])   ? Renal disorder   ? kidney infections  ? ? ?Past Surgical History:  ?Procedure Laterality Date  ? APPENDECTOMY    ? BRONCHIAL BIOPSY  09/21/2021  ? Procedure: BRONCHIAL BIOPSIES;  Surgeon: Collene Gobble, MD;  Location: [facility] ENDOSCOPY;  Service: Pulmonary;;  ? BRONCHIAL BRUSHINGS  09/21/2021  ? Procedure: BRONCHIAL BRUSHINGS;  Surgeon: Collene Gobble, MD;  Location: [facility] ENDOSCOPY;  Service: Pulmonary;;  ? BRONCHIAL NEEDLE ASPIRATION BIOPSY  09/21/2021  ? Procedure: BRONCHIAL NEEDLE ASPIRATION BIOPSIES;  Surgeon: Collene Gobble, MD;  Location: [facility] ENDOSCOPY;  Service: Pulmonary;;  ? ESOPHAGOGASTRODUODENOSCOPY N/A 12/26/2021  ? Procedure: ESOPHAGOGASTRODUODENOSCOPY (EGD);  Surgeon: Clarene Essex, MD;  Location: [facility] ENDOSCOPY;  Service: Endoscopy;  Laterality: N/A;  ? ESOPHAGOGASTRODUODENOSCOPY N/A 02/11/2022  ? Procedure: ESOPHAGOGASTRODUODENOSCOPY (EGD);  Surgeon: Arta Silence, MD;  Location: [facility] ENDOSCOPY;  Service: Gastroenterology;  Laterality: N/A;  ? ESOPHAGOGASTRODUODENOSCOPY (EGD) WITH PROPOFOL N/A 01/07/2022  ? Procedure: ESOPHAGOGASTRODUODENOSCOPY (EGD) WITH PROPOFOL;  Surgeon: Otis Brace, MD;  Location: [facility] ENDOSCOPY;  Service: Gastroenterology;  Laterality: N/A;  ? ESOPHAGOGASTRODUODENOSCOPY (EGD) WITH PROPOFOL N/A 02/02/2022  ? Procedure: ESOPHAGOGASTRODUODENOSCOPY (EGD) WITH PROPOFOL;  Surgeon: Ronnette Juniper, MD;  Location: [facility] ENDOSCOPY;  Service: Gastroenterology;  Laterality: N/A;  ? ESOPHAGOGASTRODUODENOSCOPY (EGD) WITH PROPOFOL N/A 02/04/2022  ? Procedure: ESOPHAGOGASTRODUODENOSCOPY (EGD) WITH PROPOFOL;  Surgeon: Otis Brace, MD;  Location: [facility] ENDOSCOPY;  Service: Gastroenterology;  Laterality: N/A;  ? FOREIGN BODY REMOVAL  12/26/2021  ? Procedure: FOREIGN BODY REMOVAL;  Surgeon: Clarene Essex, MD;  Location: [facility] ENDOSCOPY;  Service: Endoscopy;;  ? FOREIGN BODY  REMOVAL  02/02/2022  ? Procedure: FOREIGN BODY REMOVAL;  Surgeon: Ronnette Juniper, MD;  Location: [facility] ENDOSCOPY;  Service: Gastroenterology;;  ? FOREIGN BODY REMOVAL  02/11/2022  ? Procedure: FOREIGN BODY REMOVAL;  Surgeon: Arta Silence, MD;  Location: [facility] ENDOSCOPY;  Service: Gastroenterology;;  ? IR CHEST FLUORO  12/27/2021  ? LUMBAR LAMINECTOMY N/A 05/13/2018  ? Procedure: LEFT LUMBAR FOUR-FIVE MICRODISCECTOMY;  Surgeon: Marybelle Killings, MD;  Location: [place];  Service: Orthopedics;  Laterality: N/A;  ? TUBAL LIGATION    ? TUBAL LIGATION  1998  ? VIDEO BRONCHOSCOPY WITH ENDOBRONCHIAL ULTRASOUND N/A 09/21/2021  ? Procedure: VIDEO BRONCHOSCOPY WITH ENDOBRONCHIAL ULTRASOUND;  Surgeon: Collene Gobble, MD;  Location: [facility] ENDOSCOPY;  Service: Pulmonary;  Laterality: N/A;  ? ? ?Social History: ? reports that she has been smoking cigarettes. She has a 15.00 pack-year smoking history. She has never used smokeless tobacco. She reports that she does not currently use alcohol. She reports that she does not currently use drugs after having used the following drugs: Marijuana. ? ? ?Allergies  ?Allergen Reactions  ? Naproxen Nausea Only  ? Bactrim [Sulfamethoxazole-Trimethoprim] Nausea Only  ? ? ?Family History  ?Problem Relation Age of Onset  ? Hypertension Mother   ? Hypertension Father   ? Heart disease Father   ? ? ?Family history reviewed and not pertinent  ? ? ?Prior to Admission medications   ?Medication Sig Start Date End Date Taking? Authorizing Provider  ?albuterol (VENTOLIN HFA) 108 (90 Base) MCG/ACT inhaler  Inhale 2 puffs into the lungs every 6 (six) hours as needed for wheezing or shortness of breath. 02/08/22  Yes Mercy Riding, MD  ?apixaban (ELIQUIS) 5 MG TABS tablet Take 1 tablet (5 mg total) by mouth 2 (two) times daily. 02/08/22  Yes Mercy Riding, MD  ?diphenhydramine-acetaminophen (TYLENOL PM) 25-500 MG TABS tablet Take 3 tablets by mouth at bedtime as needed (for sleep).   Yes [provider]  ?sucralfate  (CARAFATE) 1 GM/10ML suspension Take 10 mLs (1 g total) by mouth 4 (four) times daily -  with meals and at bedtime. ?Patient taking differently: Take 1 g by mouth 2 (two) times daily. 01/24/22  Yes Gery Pray, MD

## 2022-03-04 NOTE — Assessment & Plan Note (Signed)
(  see food impaction) ?

## 2022-03-04 NOTE — Progress Notes (Signed)
[name] ? ?[name] 51 y.o. 15-Jul-1971 ? ?CC:  Food impaction ? ? ?Subjective: ?Patient sitting up in bed this morning, tearful.  Tolerating liquid diet though she reports some heartburn and chest pain. States pain medication only helps for about 15 minutes.  Denies nausea, vomiting, fever, chills. Had some regurgitation of liquids yesterday. Had a normal bowel movement yesterday but denies BM yet today. ? ?ROS : Review of Systems  ?Constitutional:  Negative for chills and fever.  ?Cardiovascular:  Positive for chest pain.  ?Gastrointestinal:  Positive for heartburn. Negative for abdominal pain, blood in stool, constipation, diarrhea, melena, nausea and vomiting.  ?Genitourinary:  Negative for dysuria and urgency.   ? ? ?Objective: ?Vital signs in last 24 hours: ?Vitals:  ? 03/04/22 0252 03/04/22 0719  ?BP: 105/74 99/70  ?Pulse: 93 86  ?Resp: 14 18  ?Temp: 97.9 ?F (36.6 ?C) 98 ?F (36.7 ?C)  ?SpO2: 100% 99%  ? ? ?Physical Exam: ? ?General:  Alert, tearful but no acute distress, thin and chronically ill-appearing  ?Head:  Normocephalic, without obvious abnormality, atraumatic  ?Eyes:  Anicteric sclera, EOM's intact  ?Lungs:   Expiratory wheezing bilaterally, respirations unlabored  ?Heart:  Regular rate and rhythm, S1, S2 normal  ?Abdomen:   Soft, non-tender, bowel sounds active all four quadrants,  no masses,   ?Extremities: Extremities normal, atraumatic, no  edema  ?Pulses: 2+ and symmetric  ? ? ?Lab Results: ?Recent Labs  ?  03/03/22 ?1741 03/04/22 ?0405  ?NA 139 133*  ?K 2.9* 4.8  ?CL 110 109  ?CO2 20* 19*  ?GLUCOSE 99 131*  ?BUN 11 10  ?CREATININE 1.14* 0.77  ?CALCIUM 10.2 9.3  ?MG  --  1.7  ? ?Recent Labs  ?  03/04/22 ?0405  ?AST 19  ?ALT 12  ?ALKPHOS 53  ?BILITOT 0.9  ?PROT 6.6  ?ALBUMIN 3.7  ? ?Recent Labs  ?  03/03/22 ?1741 03/04/22 ?0405  ?WBC 7.3 5.8  ?NEUTROABS  --  5.1  ?HGB 12.0 10.3*  ?HCT 35.7* 31.7*  ?MCV 104.1* 105.7*  ?PLT 259 229  ? ?Recent Labs  ?  03/04/22 ?0405   ?LABPROT 13.8  ?INR 1.1  ? ? ? ? ?Assessment ?Food impaction ?- EGD 03/03/22 - Food in mid esophagus, successfully removed.  Benign-appearing esophageal stenosis.  Lesion not amenable to dilation and not attempted. Severe esophagitis with bleeding. ? ?- Hgb 10.3 (12.0) ?- No leukocytosis ?- BUN 10, Cr 0.77, GFR >60 ? ?Plan: ?Patient with multiple endoscopies in the last few months for dysphagia/food impaction. Esophageal stricture not amenable to dilation.  ?Plan for EGD with esophageal stent placement Wednesday 03/06/22 (Dr. Watt Climes).  ?Continue PPI BID.  ?Continue supportive care.  ?Continue daily CBC and transfuse as needed to maintain HGB > 7  ?[facility] will follow. ? ?Angelique Holm PA-C ?03/04/2022, 9:50 AM ? ?Contact #  [phone]  ?

## 2022-03-04 NOTE — Assessment & Plan Note (Signed)
? ? ?#)   Chronic anemia: Documented history of such, with chart review revealing baseline hemoglobin range of 8.5-12.  Presenting hemoglobin of 12 consistent with this range, and represents slight interval increase from most recent prior value of 9.9 on 02/11/2022.  Closely monitoring blood loss in the setting of report of mild esophageal bleeding at the site of impaction as observed on today's EGD, as further detailed above.  We will continue to trend closely overnight, with every 4 hour H&H's ordered through 5 AM on 03/04/2022.  Of note, patient currently appears hemodynamically stable. ? ?Plan: H&H ordered for 1 AM on 03/04/2022.  Repeat CBC in the morning.  Monitor on telemetry.  Check INR. ? ? ?

## 2022-03-04 NOTE — Assessment & Plan Note (Signed)
? ?#)   Chronic tobacco abuse: Patient conveys that she is a current smoker, having smoked half pack per day for at least the last 30 years. ? ?Plan: Counseled patient importance of complete smoking discontinuation, particularly in the setting of her history of right upper lobe lung cancer.  Prn nicotine patch has been ordered for use during this hospitalization. ? ? ?

## 2022-03-04 NOTE — Assessment & Plan Note (Signed)
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  Consequently, Dr. Michail Sermon, of [facility] gastroenterology was consulted and performed EGD with food disimpaction today.  Dr.Schooler Noted a small amount of esophageal bleeding at the site of the disimpaction, without evidence of esophageal perforation.  Consequently, Dr.Schooler Recommended overnight observation to the hospitalist service for further evaluation management of presenting food impaction.  He conveys that gastroenterology will continue to follow, and see the patient again in the morning (03/04/22).  Patient appears hemodynamically stable, with hemoglobin on the upper limit of her normal range.  We will continue to trend hemoglobin overnight, as qualified below.  No evidence of respiratory distress or airway compromise at this time. ? ?As described above, gastroenterology had previously recommended Protonix 40 mg p.o. twice daily, although it does not appear that the patient has been taking this medication I recent.  ? ? ?Plan: Clear liquid diet, advance as tolerated to mechanical soft diet, as above.  Gentle IV fluids for rehydration purposes.  Aspiration precautions ordered.  Protonix 40 mg IV twice daily.  Repeat H&H at 1 AM.  CBC in the morning.  Check INR.  Monitor on telemetry.  Prn IV fentanyl.  Gastroenterology consulted, and plans to see the patient again in the morning, as above. ? ?

## 2022-03-04 NOTE — Progress Notes (Addendum)
ANTICOAGULATION CONSULT NOTE ? ?Pharmacy Consult for IV heparin ?Indication: history of DVT (PTA Apixaban on hold) ? ?Allergies  ?Allergen Reactions  ? Naproxen Nausea Only  ? Bactrim [Sulfamethoxazole-Trimethoprim] Nausea Only  ? ? ?Patient Measurements: ?Height: 5\' 8"  (172.7 cm) ?Weight: 61.6 kg (135 lb 14.4 oz) ?IBW/kg (Calculated) : 63.9 ?Heparin Dosing Weight: actual body weight  ? ?Vital Signs: ?Temp: 98.1 ?F (36.7 ?C) (04/10 1239) ?Temp Source: Oral (04/10 1239) ?BP: 108/78 (04/10 1239) ?Pulse Rate: 105 (04/10 1239) ? ?Labs: ?Recent Labs  ?  03/03/22 ?1741 03/04/22 ?0405 03/04/22 ?1155 03/04/22 ?2009  ?HGB 12.0 10.3*  --   --   ?HCT 35.7* 31.7*  --   --   ?PLT 259 229  --   --   ?APTT  --   --  32  --   ?LABPROT  --  13.8  --   --   ?INR  --  1.1  --   --   ?HEPARINUNFRC  --   --  <0.10* 0.34  ?CREATININE 1.14* 0.77  --   --   ? ? ? ?Estimated Creatinine Clearance: 81.8 mL/min (by C-G formula based on SCr of 0.77 mg/dL). ? ? ?Medical History: ?Past Medical History:  ?Diagnosis Date  ? Ankle sprain   ? left  ? Anxiety   ? treated for panic attacks in the past.  ? Asthma   ? COPD (chronic obstructive pulmonary disease) ([place])   ? Depression   ? History of radiation therapy   ? Right lung- 09/26/21-11/13/21- Dr. Gery Pray  ? Irregular heart rate   ? Lung cancer ([place])   ? Renal disorder   ? kidney infections  ? ? ?Medications:  ?PTA Apixaban 5mg  PO BID-last dose 03/02/2022 at 2100 ? 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 Apixaban held on admission. Underwent EGD and food disimpaction 4/9. GI plans for EGD with esophageal stent placement on 4/12. Pharmacy consulted for IV heparin dosing while Apixaban on hold due to need for GI procedures. CBC: Hgb low/decreased to 10.3, Pltc WNL. ? ? ?Goal of Therapy:  ?aPTT 66-102 seconds ?Heparin level 0.3-0.7  units/mL ?Monitor platelets by anticoagulation protocol: Yes ?  ?Plan:  ?Heparin level 0.34, therapeutic ?Continue heparin infusion at 1100 units/hr ?Confirmatory level in ~ 6 hours ?No bolus per MD ?Daily CBC, heparin level ?Monitor closely for s/sx of bleeding ?GI: please indicate when heparin needs to be held prior to procedure on 4/12 ? ?Tawnya Crook, PharmD, BCPS ?Clinical Pharmacist ?03/04/2022 8:48 PM ? ?

## 2022-03-04 NOTE — Progress Notes (Signed)
?PROGRESS NOTE ? ? ? ?[name]  NTI:[id number] DOB: [birth year] DOA: 03/03/2022 ?PCP: Pcp, No  ? ?Brief Narrative:  ?51 y.o. 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  On presentation, GI was consulted.  Patient underwent EGD with food disimpaction on 03/03/2022.  Subsequently, GI recommended observation in the hospital. ? ?Assessment & Plan: ?  ?Food impaction ?History of esophageal stenosis/radiation induced esophagitis ?-Status post EGD and food disimpaction on 03/03/2022.  GI planning on repeat EGD with stenting at some point.  Continue IV fluids and IV Protonix. ? ?Hypokalemia ?-Resolved ? ?History of acute DVT ?-Currently on Eliquis as an outpatient for acute DVT diagnosed in February 2023.  Will hold Eliquis.  Start heparin drip since patient might have another EGD at some point. ? ?Hyponatremia ?-Mild.  Monitor ? ?Anemia of chronic disease ?Macrocytosis ?-Hemoglobin stable.  Monitor intermittently. ? ?Chronic tobacco abuse ?-Patient was counseled regarding cessation by admitting hospitalist. ? ?COPD ?-Respiratory status stable.  Continue as needed albuterol ? ?History of lung cancer with radiation therapy ?-Outpatient follow-up with oncology ? ?DVT prophylaxis: Start heparin drip ?Code Status: Full ?Family Communication: Mother at bedside ?Disposition Plan: ?Status is: Observation ?The patient will require care spanning > 2 midnights and should be moved to inpatient because: Of need for further GI intervention ? ?Consultants: GI ? ?Procedures: EGD and food disimpaction on 03/03/2022 ? ?Antimicrobials: None ? ? ?Subjective: ?Patient seen and examined at bedside.  Denies any current nausea or vomiting.  Denies any abdominal pain.  No fever or worsening shortness of breath  reported. ? ?Objective: ?Vitals:  ? 03/04/22 0200 03/04/22 0215 03/04/22 0252 03/04/22 0719  ?BP: 102/80 101/73 105/74 99/70  ?Pulse: 98 98 93 86  ?Resp: 16 14 14 18   ?Temp:   97.9 ?F (36.6 ?C) 98 ?F (36.7 ?C)  ?TempSrc:   Oral Oral  ?SpO2: 100% 97% 100% 99%  ?Weight:   61.6 kg   ?Height:   5\' 8"  (1.727 m)   ? ? ?Intake/Output Summary (Last 24 hours) at 03/04/2022 1115 ?Last data filed at 03/04/2022 0600 ?Gross per 24 hour  ?Intake 975 ml  ?Output --  ?Net 975 ml  ? ?Filed Weights  ? 03/04/22 0252  ?Weight: 61.6 kg  ? ? ?Examination: ? ?General exam: Appears calm and comfortable.  Currently on 3 L oxygen via nasal cannula.   ?Respiratory system: Bilateral decreased breath sounds at bases with some scattered crackles ?Cardiovascular system: S1 & S2 heard, Rate controlled ?Gastrointestinal system: Abdomen is nondistended, soft and nontender. Normal bowel sounds heard. ?Extremities: No cyanosis, clubbing, edema  ?Central nervous system: Alert and oriented. No focal neurological deficits. Moving extremities ?Skin: No rashes, lesions or ulcers ?Psychiatry: Judgement and insight appear normal. Mood & affect appropriate.  ? ? ? ?Data Reviewed: I have personally reviewed following labs and imaging studies ? ?CBC: ?Recent Labs  ?Lab 03/03/22 ?1741 03/04/22 ?0405  ?WBC 7.3 5.8  ?NEUTROABS  --  5.1  ?HGB 12.0 10.3*  ?HCT 35.7* 31.7*  ?MCV 104.1* 105.7*  ?PLT 259 229  ? ?Basic Metabolic Panel: ?Recent Labs  ?Lab 03/03/22 ?1741 03/04/22 ?0405  ?NA 139 133*  ?K 2.9* 4.8  ?CL 110 109  ?CO2 20* 19*  ?GLUCOSE 99 131*  ?BUN 11 10  ?CREATININE 1.14* 0.77  ?CALCIUM 10.2 9.3  ?MG  --  1.7  ? ?GFR: ?Estimated Creatinine Clearance: 81.8 mL/min (by C-G formula based on SCr of 0.77 mg/dL). ?Liver Function Tests: ?Recent Labs  ?Lab 03/04/22 ?0405  ?AST 19  ?ALT 12  ?ALKPHOS 53  ?BILITOT 0.9  ?PROT 6.6  ?ALBUMIN 3.7  ? ?No results for input(s): LIPASE, AMYLASE in the last 168 hours. ?No results for input(s): AMMONIA in the last 168  hours. ?Coagulation Profile: ?Recent Labs  ?Lab 03/04/22 ?0405  ?INR 1.1  ? ?Cardiac Enzymes: ?No results for input(s): CKTOTAL, CKMB, CKMBINDEX, TROPONINI in the last 168 hours. ?BNP (last 3 results) ?No results for input(s): PROBNP in the last 8760 hours. ?HbA1C: ?No results for input(s): HGBA1C in the last 72 hours. ?CBG: ?No results for input(s): GLUCAP in the last 168 hours. ?Lipid Profile: ?No results for input(s): CHOL, HDL, LDLCALC, TRIG, CHOLHDL, LDLDIRECT in the last 72 hours. ?Thyroid Function Tests: ?No results for input(s): TSH, T4TOTAL, FREET4, T3FREE, THYROIDAB in the last 72 hours. ?Anemia Panel: ?No results for input(s): VITAMINB12, FOLATE, FERRITIN, TIBC, IRON, RETICCTPCT in the last 72 hours. ?Sepsis Labs: ?No results for input(s): PROCALCITON, LATICACIDVEN in the last 168 hours. ? ?No results found for this or any previous visit (from the past 240 hour(s)).  ? ? ? ? ? ?Radiology Studies: ?No results found. ? ? ? ? ? ?Scheduled Meds: ? mouth rinse  15 mL Mouth Rinse BID  ? pantoprazole (PROTONIX) IV  40 mg Intravenous Q12H  ? ?Continuous Infusions: ? lactated ringers 150 mL/hr at 03/03/22 2341  ? ? ? ? ? ? ? ? ?Aline August, MD ?[facility] ?03/04/2022, 11:15 AM  ? ?

## 2022-03-04 NOTE — Assessment & Plan Note (Signed)
? ?#)   Hypokalemia: Presenting serum potassium level of 2.9. ? ?Plan: Potassium chloride 40 mill equivalents IV every 4 hours x1 dose now.  Add on serum magnesium level.  Repeat BMP in the morning.  Monitor on symmetry. ? ? ?

## 2022-03-04 NOTE — Assessment & Plan Note (Signed)
? ? ?#)   COPD: Documented history of such, in the setting of being a current smoker.  Outpatient respiratory regimen limited to prn albuterol in the absence of any scheduled breathing treatments.  No clinical evidence to suggest acute COPD exacerbation at this time. ? ?Plan: Prn albuterol nebulizer. ? ? ?

## 2022-03-04 NOTE — Assessment & Plan Note (Signed)
? ?#)   History of acute DVT: Per chart review, patient was diagnosed with acute DVT in February 2023, for which she has subsequently been anticoagulated with Eliquis, upon which the patient reports good compliance. ? ?Plan: Resume home Eliquis.  Pete CBC in morning. ? ? ?

## 2022-03-04 NOTE — Progress Notes (Signed)
ANTICOAGULATION CONSULT NOTE - Initial Consult ? ?Pharmacy Consult for IV heparin ?Indication: history of DVT (PTA Apixaban on hold) ? ?Allergies  ?Allergen Reactions  ? Naproxen Nausea Only  ? Bactrim [Sulfamethoxazole-Trimethoprim] Nausea Only  ? ? ?Patient Measurements: ?Height: 5\' 8"  (172.7 cm) ?Weight: 61.6 kg (135 lb 14.4 oz) ?IBW/kg (Calculated) : 63.9 ?Heparin Dosing Weight: actual body weight  ? ?Vital Signs: ?Temp: 98 ?F (36.7 ?C) (04/10 0719) ?Temp Source: Oral (04/10 0719) ?BP: 99/70 (04/10 0719) ?Pulse Rate: 86 (04/10 0719) ? ?Labs: ?Recent Labs  ?  03/03/22 ?1741 03/04/22 ?0405  ?HGB 12.0 10.3*  ?HCT 35.7* 31.7*  ?PLT 259 229  ?LABPROT  --  13.8  ?INR  --  1.1  ?CREATININE 1.14* 0.77  ? ? ?Estimated Creatinine Clearance: 81.8 mL/min (by C-G formula based on SCr of 0.77 mg/dL). ? ? ?Medical History: ?Past Medical History:  ?Diagnosis Date  ? Ankle sprain   ? left  ? Anxiety   ? treated for panic attacks in the past.  ? Asthma   ? COPD (chronic obstructive pulmonary disease) ([place])   ? Depression   ? History of radiation therapy   ? Right lung- 09/26/21-11/13/21- Dr. Gery Pray  ? Irregular heart rate   ? Lung cancer ([place])   ? Renal disorder   ? kidney infections  ? ? ?Medications:  ?PTA Apixaban 5mg  PO BID-last dose 03/02/2022 at 2100 ? ?Assessment: ?44 y/oF with PMH of small cell lung cancer, esophageal stricture, upper extremity DVT in the setting of PICC line (12/20/2021) on Apixaban PTA who presented to [facility] ED with impacted food bolus. Apixaban held on admission. Underwent EGD and food disimpaction 4/9. GI plans for EGD with esophageal stent placement on 4/12. Pharmacy consulted for IV heparin dosing while Apixaban on hold due to need for GI procedures. CBC: Hgb low/decreased to 10.3, Pltc WNL. ? ? ?Goal of Therapy:  ?aPTT 66-102 seconds ?Heparin level 0.3-0.7 units/mL ?Monitor platelets by anticoagulation protocol: Yes ?  ?Plan:  ?Baseline aPTT, heparin level ?No bolus per MD ?Start IV heparin infusion  at 1100 units/hr ?Note that recent Apixaban can falsely elevate heparin levels, so will use aPTT for dose titration/monitoring until effects of Apixaban on heparin level have dissipated ?aPTT 6 hours after initiation ?Daily CBC, heparin level, aPTT ?Monitor closely for s/sx of bleeding ?GI: please indicate when heparin needs to be held prior to procedure on 4/12 ? ? ?Lindell Spar, PharmD, BCPS ?Clinical Pharmacist ?03/04/2022,11:46 AM ? ? ? ?Addendum: ?Baseline heparin level < 0.1 units/mL, so will use heparin levels for dose titration/monitoring. No need for further aPTT monitoring.  ? ? ?Plan:  ?Heparin level 6 hours after initiation of heparin infusion and daily while on heparin infusion  ? ? ?Lindell Spar, PharmD, BCPS ?Clinical Pharmacist  ?03/04/2022 2:06 PM ?

## 2022-03-05 ENCOUNTER — Ambulatory Visit
Admission: RE | Admit: 2022-03-05 | Discharge: 2022-03-05 | Disposition: A | Discharge status: Home / Self Care | Payer: Medicaid Other | Source: Ambulatory Visit | Attending: Radiation Oncology | Admitting: Radiation Oncology

## 2022-03-05 ENCOUNTER — Other Ambulatory Visit: Payer: Self-pay

## 2022-03-05 DIAGNOSIS — I82621 Acute embolism and thrombosis of deep veins of right upper extremity: Secondary | ICD-10-CM | POA: Diagnosis not present

## 2022-03-05 DIAGNOSIS — T18128A Food in esophagus causing other injury, initial encounter: Secondary | ICD-10-CM | POA: Diagnosis not present

## 2022-03-05 DIAGNOSIS — K222 Esophageal obstruction: Secondary | ICD-10-CM

## 2022-03-05 DIAGNOSIS — E876 Hypokalemia: Secondary | ICD-10-CM | POA: Diagnosis not present

## 2022-03-05 DIAGNOSIS — C3411 Malignant neoplasm of upper lobe, right bronchus or lung: Secondary | ICD-10-CM

## 2022-03-05 LAB — CBC WITH DIFFERENTIAL/PLATELET
Abs Immature Granulocytes: 0.03 10*3/uL (ref 0.00–0.07)
Basophils Absolute: 0 10*3/uL (ref 0.0–0.1)
Basophils Relative: 0 %
Eosinophils Absolute: 0.1 10*3/uL (ref 0.0–0.5)
Eosinophils Relative: 2 %
HCT: 26.2 % — ABNORMAL LOW (ref 36.0–46.0)
Hemoglobin: 8.8 g/dL — ABNORMAL LOW (ref 12.0–15.0)
Immature Granulocytes: 1 %
Lymphocytes Relative: 23 %
Lymphs Abs: 1.2 10*3/uL (ref 0.7–4.0)
MCH: 35.5 pg — ABNORMAL HIGH (ref 26.0–34.0)
MCHC: 33.6 g/dL (ref 30.0–36.0)
MCV: 105.6 fL — ABNORMAL HIGH (ref 80.0–100.0)
Monocytes Absolute: 0.6 10*3/uL (ref 0.1–1.0)
Monocytes Relative: 11 %
Neutro Abs: 3.1 10*3/uL (ref 1.7–7.7)
Neutrophils Relative %: 63 %
Platelets: 174 10*3/uL (ref 150–400)
RBC: 2.48 MIL/uL — ABNORMAL LOW (ref 3.87–5.11)
RDW: 15.4 % (ref 11.5–15.5)
WBC: 5 10*3/uL (ref 4.0–10.5)
nRBC: 0 % (ref 0.0–0.2)

## 2022-03-05 LAB — BASIC METABOLIC PANEL
Anion gap: 4 — ABNORMAL LOW (ref 5–15)
BUN: 6 mg/dL (ref 6–20)
CO2: 21 mmol/L — ABNORMAL LOW (ref 22–32)
Calcium: 9.2 mg/dL (ref 8.9–10.3)
Chloride: 114 mmol/L — ABNORMAL HIGH (ref 98–111)
Creatinine, Ser: 0.79 mg/dL (ref 0.44–1.00)
GFR, Estimated: >60 mL/min (ref >60)
Glucose, Bld: 87 mg/dL (ref 70–99)
Potassium: 3.4 mmol/L — ABNORMAL LOW (ref 3.5–5.1)
Sodium: 139 mmol/L (ref 135–145)

## 2022-03-05 LAB — HEPARIN LEVEL (UNFRACTIONATED): Heparin Unfractionated: 0.44 IU/mL (ref 0.30–0.70)

## 2022-03-05 LAB — MAGNESIUM: Magnesium: 1.6 mg/dL — ABNORMAL LOW (ref 1.7–2.4)

## 2022-03-05 MED ORDER — PROCHLORPERAZINE EDISYLATE 10 MG/2ML IJ SOLN
5.0000 mg | Freq: Once | INTRAMUSCULAR | Status: AC
  Administered 2022-03-05: 5 mg via INTRAVENOUS
  Filled 2022-03-05: qty 2

## 2022-03-05 MED ORDER — BOOST / RESOURCE BREEZE PO LIQD CUSTOM: 1.0000 | Freq: Three times a day (TID) | ORAL | Status: DC

## 2022-03-05 MED ORDER — MAGNESIUM SULFATE 2 GM/50ML IV SOLN
2.0000 g | Freq: Once | INTRAVENOUS | Status: AC
Start: 2022-03-05 — End: 2022-03-05
  Administered 2022-03-05: 2 g via INTRAVENOUS
  Filled 2022-03-05: qty 50

## 2022-03-05 MED ORDER — GUAIFENESIN-DM 100-10 MG/5ML PO SYRP
10.0000 mL | ORAL_SOLUTION | ORAL | Status: DC | PRN
  Administered 2022-03-05 – 2022-03-06 (×2): 10 mL via ORAL
  Filled 2022-03-05 (×2): qty 10

## 2022-03-05 MED ORDER — BUTALBITAL-APAP-CAFFEINE 50-325-40 MG PO TABS
1.0000 | ORAL_TABLET | Freq: Once | ORAL | Status: AC
  Administered 2022-03-05: 1 via ORAL
  Filled 2022-03-05: qty 1

## 2022-03-05 MED ORDER — ALPRAZOLAM 0.25 MG PO TABS
0.2500 mg | ORAL_TABLET | Freq: Three times a day (TID) | ORAL | Status: DC | PRN
  Administered 2022-03-05 – 2022-03-07 (×4): 0.25 mg via ORAL
  Filled 2022-03-05 (×4): qty 1

## 2022-03-05 MED ORDER — POTASSIUM CHLORIDE IN NACL 40-0.9 MEQ/L-% IV SOLN
INTRAVENOUS | Status: DC
  Filled 2022-03-05 (×3): qty 1000

## 2022-03-05 NOTE — Progress Notes (Addendum)
?PROGRESS NOTE ? ? ? ?[name]  TTS:[id number] DOB: 09/17/1971 DOA: 03/03/2022 ?PCP: Pcp, No  ? ?Brief Narrative:  ?51 y.o. 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  On presentation, GI was consulted.  Patient underwent EGD with food disimpaction on 03/03/2022.  Subsequently, GI recommended observation in the hospital.  GI is planning on repeat EGD with stenting on 03/06/2022. ? ?Assessment & Plan: ?  ?Food impaction ?History of esophageal stenosis/radiation induced esophagitis ?-Status post EGD and food disimpaction on 03/03/2022.  GI planning on repeat EGD with stenting at some point.  Continue IV fluids and IV Protonix.  Continue sucralfate ? ?Hypokalemia ?-Replace.  Repeat a.m. labs. ? ?Hypomagnesemia ?-Replace.  Repeat a.m. labs ? ?History of acute DVT ?-Currently on Eliquis as an outpatient for acute DVT diagnosed in February 2023.  ?-Eliquis on hold.  Continue heparin drip. ? ?Hyponatremia ?-Resolved ? ?Anemia of chronic disease ?Macrocytosis ?-Hemoglobin stable.  Monitor intermittently. ? ?Chronic tobacco abuse ?-Patient was counseled regarding cessation by admitting hospitalist. ? ?COPD ?-Respiratory status stable.  Continue as needed albuterol ? ?History of lung cancer with radiation therapy ?-Outpatient follow-up with oncology ? ?DVT prophylaxis: heparin drip ?Code Status: Full ?Family Communication: Mother at bedside ?Disposition Plan: ?Status is:  inpatient because: Of need for further GI intervention ? ?Consultants: GI ? ?Procedures: EGD and food disimpaction on 03/03/2022 ? ?Antimicrobials: None ? ? ?Subjective: ?Patient seen and examined at bedside.  She complains of intermittent cough.  No fever, vomiting, chest pain reported. ?Objective: ?Vitals:  ? 03/04/22 2318  03/05/22 0238 03/05/22 0600 03/05/22 0619  ?BP: 101/67 109/81  112/76  ?Pulse: (!) 101 (!) 101  95  ?Resp: 17 18    ?Temp: 98.2 ?F (36.8 ?C) 98 ?F (36.7 ?C)  98.2 ?F (36.8 ?C)  ?TempSrc: Oral Oral  Oral  ?SpO2: 92% 94%  97%  ?Weight:   64.3 kg   ?Height:      ? ?No intake or output data in the 24 hours ending 03/05/22 0755 ? ?Filed Weights  ? 03/04/22 0252 03/05/22 0600  ?Weight: 61.6 kg 64.3 kg  ? ? ?Examination: ? ?General: On room air.  No distress ?ENT/neck: No thyromegaly.  JVD is not elevated  ?respiratory: Decreased breath sounds at bases bilaterally with some crackles; no wheezing  ?CVS: S1-S2 heard, intermittently tachycardic  ?abdominal: Soft, nontender, slightly distended; no organomegaly, normal bowel sounds are heard ?Extremities: Trace lower extremity edema; no cyanosis  ?CNS: Awake and alert.  No focal neurologic deficit.  Moves extremities ?Lymph: No obvious lymphadenopathy ?Skin: No obvious ecchymosis/lesions  ?psych: Affect, judgment and mood are normal  ?musculoskeletal: No obvious joint swelling/deformity ? ? ? ? ?Data Reviewed: I have personally reviewed following labs and imaging studies ? ?CBC: ?Recent Labs  ?Lab 03/03/22 ?1741 03/04/22 ?0405 03/05/22 ?0923  ?WBC 7.3 5.8 5.0  ?NEUTROABS  --  5.1 3.1  ?HGB 12.0 10.3* 8.8*  ?HCT 35.7* 31.7* 26.2*  ?MCV 104.1* 105.7* 105.6*  ?PLT 259 229 174  ? ? ?Basic Metabolic Panel: ?Recent Labs  ?Lab 03/03/22 ?1741 03/04/22 ?0405 03/05/22 ?3007  ?NA 139 133* 139  ?K 2.9* 4.8 3.4*  ?CL 110 109 114*  ?CO2 20* 19* 21*  ?GLUCOSE 99 131* 87  ?BUN 11 10 6   ?CREATININE 1.14* 0.77 0.79  ?CALCIUM 10.2 9.3 9.2  ?  MG  --  1.7 1.6*  ? ? ?GFR: ?Estimated Creatinine Clearance: 84.9 mL/min (by C-G formula based on SCr of 0.79 mg/dL). ?Liver Function Tests: ?Recent Labs  ?Lab 03/04/22 ?0405  ?AST 19  ?ALT 12  ?ALKPHOS 53  ?BILITOT 0.9  ?PROT 6.6  ?ALBUMIN 3.7  ? ? ?No results for input(s): LIPASE, AMYLASE in the last 168 hours. ?No results for input(s): AMMONIA in the last  168 hours. ?Coagulation Profile: ?Recent Labs  ?Lab 03/04/22 ?0405  ?INR 1.1  ? ? ?Cardiac Enzymes: ?No results for input(s): CKTOTAL, CKMB, CKMBINDEX, TROPONINI in the last 168 hours. ?BNP (last 3 results) ?No results for input(s): PROBNP in the last 8760 hours. ?HbA1C: ?No results for input(s): HGBA1C in the last 72 hours. ?CBG: ?No results for input(s): GLUCAP in the last 168 hours. ?Lipid Profile: ?No results for input(s): CHOL, HDL, LDLCALC, TRIG, CHOLHDL, LDLDIRECT in the last 72 hours. ?Thyroid Function Tests: ?No results for input(s): TSH, T4TOTAL, FREET4, T3FREE, THYROIDAB in the last 72 hours. ?Anemia Panel: ?No results for input(s): VITAMINB12, FOLATE, FERRITIN, TIBC, IRON, RETICCTPCT in the last 72 hours. ?Sepsis Labs: ?No results for input(s): PROCALCITON, LATICACIDVEN in the last 168 hours. ? ?No results found for this or any previous visit (from the past 240 hour(s)).  ? ? ? ? ? ?Radiology Studies: ?No results found. ? ? ? ? ? ?Scheduled Meds: ? fluticasone  1 spray Each Nare Daily  ? mouth rinse  15 mL Mouth Rinse BID  ? pantoprazole (PROTONIX) IV  40 mg Intravenous Q12H  ? sucralfate  1 g Oral TID WC & HS  ? ?Continuous Infusions: ? heparin 1,100 Units/hr (03/04/22 1339)  ? lactated ringers 75 mL/hr at 03/05/22 0246  ? ? ? ? ? ? ? ? ?Aline August, MD ?[facility] ?03/05/2022, 7:55 AM  ? ?

## 2022-03-05 NOTE — Progress Notes (Signed)
Per Andree Coss, PA-C, stop heparin infusion 6 hours prior to EGD/esophageal stent placement on 03/06/22. Procedure scheduled for 1300. Stop time for heparin entered for 03/06/22 at 0700.  ? ? ?Lindell Spar, PharmD, BCPS ?Clinical Pharmacist  ?03/05/2022 11:35 AM ? ?

## 2022-03-05 NOTE — Progress Notes (Signed)
[facility] Progress Note ? ?[name] 51 y.o. 1971/09/26 ? ?CC:  Esophageal stricture, recurrent food impactions ? ? ?Subjective: ?Patient sitting up in bed this morning, feeling a little better than yesterday.  Still having chest discomfort and heartburn.  Has had 1 dose of sucralfate.  Denies nausea, vomiting, fever, chills.  Denies bowel movement today. Has an appetite. ? ?ROS : Review of Systems  ?Constitutional:  Negative for chills and fever.  ?Gastrointestinal:  Positive for heartburn. Negative for abdominal pain, blood in stool, constipation, diarrhea, melena, nausea and vomiting.  ?Genitourinary:  Negative for dysuria and urgency.   ? ? ?Objective: ?Vital signs in last 24 hours: ?Vitals:  ? 03/05/22 0238 03/05/22 0619  ?BP: 109/81 112/76  ?Pulse: (!) 101 95  ?Resp: 18   ?Temp: 98 ?F (36.7 ?C) 98.2 ?F (36.8 ?C)  ?SpO2: 94% 97%  ? ? ?Physical Exam: ? ?General:  Alert, cooperative, no distress, thin and chronically ill-appearing  ?Head:  Normocephalic, without obvious abnormality, atraumatic  ?Eyes:  Anicteric sclera, EOM's intact  ?Lungs:   Clear to auscultation bilaterally, respirations unlabored  ?Heart:  Regular rate and rhythm, S1, S2 normal  ?Abdomen:   Soft, non-tender, bowel sounds active all four quadrants,  no masses,   ?Extremities: Extremities normal, atraumatic, no  edema  ?Pulses: 2+ and symmetric  ? ? ?Lab Results: ?Recent Labs  ?  03/04/22 ?0405 03/05/22 ?3976  ?NA 133* 139  ?K 4.8 3.4*  ?CL 109 114*  ?CO2 19* 21*  ?GLUCOSE 131* 87  ?BUN 10 6  ?CREATININE 0.77 0.79  ?CALCIUM 9.3 9.2  ?MG 1.7 1.6*  ? ?Recent Labs  ?  03/04/22 ?0405  ?AST 19  ?ALT 12  ?ALKPHOS 53  ?BILITOT 0.9  ?PROT 6.6  ?ALBUMIN 3.7  ? ?Recent Labs  ?  03/04/22 ?0405 03/05/22 ?7341  ?WBC 5.8 5.0  ?NEUTROABS 5.1 3.1  ?HGB 10.3* 8.8*  ?HCT 31.7* 26.2*  ?MCV 105.7* 105.6*  ?PLT 229 174  ? ?Recent Labs  ?  03/04/22 ?0405  ?LABPROT 13.8  ?INR 1.1  ? ? ? ? ?Assessment ?Radiation-induced esophageal stricture, recurrent  food impactions ?- EGD 03/03/22 - Food in mid esophagus, successfully removed.  Benign-appearing esophageal stenosis.  Lesion not amenable to dilation and not attempted. Severe esophagitis with bleeding. ?  ?- Hgb 8.8 (10.3) ?- No leukocytosis ?- BUN 6, Cr 0.79, GFR >60 ? ?Plan: ?Plan for EGD w/ esophageal stent placement tomorrow. I thoroughly discussed the procedures to include nature, alternatives, benefits, and risks including but not limited to bleeding, perforation, infection, anesthesia/cardiac and pulmonary complications. Patient provides understanding and gave verbal consent to proceed. ?Continue Protonix IV 40mg  BID ?Continue carafate suspension. ?Continue clear liquid diet. ?NPO at midnight ?Eliquis on hold. ?Continue supportive care ?[facility] will follow.    ? ?Angelique Holm PA-C ?03/05/2022, 10:43 AM ? ?Contact #  [phone]  ?

## 2022-03-05 NOTE — Progress Notes (Signed)
ANTICOAGULATION CONSULT NOTE ? ?Pharmacy Consult for IV heparin ?Indication: history of DVT (PTA Apixaban on hold) ? ?Allergies  ?Allergen Reactions  ? Naproxen Nausea Only  ? Bactrim [Sulfamethoxazole-Trimethoprim] Nausea Only  ? ? ?Patient Measurements: ?Height: 5\' 8"  (172.7 cm) ?Weight: 61.6 kg (135 lb 14.4 oz) ?IBW/kg (Calculated) : 63.9 ?Heparin Dosing Weight: actual body weight  ? ?Vital Signs: ?Temp: 98 ?F (36.7 ?C) (04/11 0238) ?Temp Source: Oral (04/11 0238) ?BP: 109/81 (04/11 0238) ?Pulse Rate: 101 (04/11 0238) ? ?Labs: ?Recent Labs  ?  03/03/22 ?1741 03/04/22 ?0405 03/04/22 ?1155 03/04/22 ?2009 03/05/22 ?9201  ?HGB 12.0 10.3*  --   --  8.8*  ?HCT 35.7* 31.7*  --   --  26.2*  ?PLT 259 229  --   --  174  ?APTT  --   --  32  --   --   ?LABPROT  --  13.8  --   --   --   ?INR  --  1.1  --   --   --   ?HEPARINUNFRC  --   --  <0.10* 0.34 0.44  ?CREATININE 1.14* 0.77  --   --   --   ? ? ? ?Estimated Creatinine Clearance: 81.8 mL/min (by C-G formula based on SCr of 0.77 mg/dL). ? ? ?Medical History: ?Past Medical History:  ?Diagnosis Date  ? Ankle sprain   ? left  ? Anxiety   ? treated for panic attacks in the past.  ? Asthma   ? COPD (chronic obstructive pulmonary disease) ([place])   ? Depression   ? History of radiation therapy   ? Right lung- 09/26/21-11/13/21- Dr. Gery Pray  ? Irregular heart rate   ? Lung cancer ([place])   ? Renal disorder   ? kidney infections  ? ? ?Medications:  ?PTA Apixaban 5mg  PO BID-last dose 03/02/2022 at 2100 ? ?Assessment: ?60 y/oF with PMH of small cell lung cancer, esophageal stricture, upper extremity DVT in the setting of PICC line (12/20/2021) on Apixaban PTA who presented to [facility] ED with impacted food bolus. Apixaban held on admission. Underwent EGD and food disimpaction 4/9. GI plans for EGD with esophageal stent placement on 4/12. Pharmacy consulted for IV heparin dosing while Apixaban on hold due to need for GI procedures. CBC: Hgb low/decreased to 10.3, Pltc  WNL. ? ?03/05/2022: ?Heparin level 0.44- therapeutic on IV heparin 1100 units/hr ?CBC: Hg low at 8.8 & dropped from admission; pltc WNL but trending down ?No bleeding or infusion related issues reported by RN  ? ?Goal of Therapy:  ?aPTT 66-102 seconds ?Heparin level 0.3-0.7 units/mL ?Monitor platelets by anticoagulation protocol: Yes ?  ?Plan:  ?Continue heparin infusion at 1100 units/hr ?Daily CBC, heparin level while on heparin ?Monitor closely for s/sx of bleeding ?GI: please indicate when heparin needs to be held prior to procedure on 4/12 ? ?Netta Cedars, PharmD, BCPS ?Clinical Pharmacist ?03/05/2022 4:05 AM ? ?

## 2022-03-05 NOTE — Progress Notes (Signed)
Initial Nutrition Assessment ? ?DOCUMENTATION CODES:  ? ?Not applicable ? ?INTERVENTION:  ?- Encourage PO intake ? ?- Boost Breeze po TID, each supplement provides 250 kcal and 9 grams of protein ? ?NUTRITION DIAGNOSIS:  ? ?Increased nutrient needs related to cancer and cancer related treatments, acute illness as evidenced by estimated needs. ? ?GOAL:  ? ?Patient will meet greater than or equal to 90% of their needs ? ?MONITOR:  ? ?PO intake, Supplement acceptance, Diet advancement, Labs, Weight trends ? ?REASON FOR ASSESSMENT:  ? ?Malnutrition Screening Tool ?  ? ?ASSESSMENT:  ? ?Pt admitted from home d/t food impaction. PMH significant for R upper lobe lung cancer s/p radiation therapy November-December 2022, esophageal dysphagia secondary to radiation -induced esophagitis, esophageal stenosis, COPD, acute DVT in February 2023 and chronic anemia. ? ?Per GI, pt with multiple endoscopies within the last few months for dysphagia/food impaction. Esophageal stricture not amenable to dilation. Plan for EGD with esophageal stent placement on Wednesday.  ? ?RD working off-site. Unsuccessful attempt to reach pt via phone call to room. Will place order for [facility] as pt is currently on clear liquid diet and continue to adjust supplements and follow up with pt as appropriate.  ? ?Meal completion: ?04/11: 95% of clear liquid breakfast tray ? ?Reviewed weight history. Pt noted to have an 8% weight loss within the last 2 months which is significant for time frame.  ? ?Medications: IV protonix, sucralfate ?IV drips: NaCl with KCl @ 83ml/hr ? ?Labs: potassium 3.4, Mg 1.6 (replacing) ? ?NUTRITION - FOCUSED PHYSICAL EXAM: ?RD working remotely. Deferred to follow up. ? ?Diet Order:   ?Diet Order   ? ?       ?  Diet NPO time specified  Diet effective midnight       ?  ?  Diet clear liquid Room service appropriate? Yes; Fluid consistency: Thin  Diet effective now       ?  ? ?  ?  ? ?  ? ? ?EDUCATION NEEDS:  ? ?No education needs  have been identified at this time ? ?Skin:  Skin Assessment: Reviewed RN Assessment ? ?Last BM:  PTA ? ?Height:  ? ?Ht Readings from Last 1 Encounters:  ?03/04/22 5\' 8"  (1.727 m)  ? ?Weight:  ? ?Wt Readings from Last 1 Encounters:  ?03/05/22 64.3 kg  ? ?BMI:  Body mass index is 21.56 kg/m?. ? ?Estimated Nutritional Needs:  ? ?Kcal:  1900-2100 ? ?Protein:  95-110g ? ?Fluid:  >/=1.9L ? ?Clayborne Dana, RDN, LDN ?Clinical Nutrition ?

## 2022-03-06 ENCOUNTER — Inpatient Hospital Stay (HOSPITAL_COMMUNITY): Payer: Medicaid Other | Admitting: Anesthesiology

## 2022-03-06 ENCOUNTER — Inpatient Hospital Stay (HOSPITAL_COMMUNITY): Payer: Medicaid Other

## 2022-03-06 ENCOUNTER — Encounter (HOSPITAL_COMMUNITY)
Admission: EM | Disposition: A | Discharge status: Home / Self Care | Payer: Self-pay | Source: Home / Self Care | Attending: Internal Medicine

## 2022-03-06 ENCOUNTER — Encounter (HOSPITAL_COMMUNITY): Payer: Self-pay | Admitting: Internal Medicine

## 2022-03-06 ENCOUNTER — Ambulatory Visit: Payer: Medicaid Other

## 2022-03-06 DIAGNOSIS — E871 Hypo-osmolality and hyponatremia: Secondary | ICD-10-CM

## 2022-03-06 DIAGNOSIS — I10 Essential (primary) hypertension: Secondary | ICD-10-CM

## 2022-03-06 DIAGNOSIS — T286XXA Corrosion of esophagus, initial encounter: Secondary | ICD-10-CM

## 2022-03-06 DIAGNOSIS — K222 Esophageal obstruction: Secondary | ICD-10-CM

## 2022-03-06 DIAGNOSIS — T5494XA Toxic effect of unspecified corrosive substance, undetermined, initial encounter: Secondary | ICD-10-CM

## 2022-03-06 HISTORY — PX: ESOPHAGEAL STENT PLACEMENT: SHX5540

## 2022-03-06 HISTORY — PX: ESOPHAGOGASTRODUODENOSCOPY: SHX5428

## 2022-03-06 LAB — BASIC METABOLIC PANEL
Anion gap: 5 (ref 5–15)
BUN: <5 mg/dL — ABNORMAL LOW (ref 6–20)
CO2: 22 mmol/L (ref 22–32)
Calcium: 9.2 mg/dL (ref 8.9–10.3)
Chloride: 113 mmol/L — ABNORMAL HIGH (ref 98–111)
Creatinine, Ser: 0.81 mg/dL (ref 0.44–1.00)
GFR, Estimated: >60 mL/min (ref >60)
Glucose, Bld: 94 mg/dL (ref 70–99)
Potassium: 4 mmol/L (ref 3.5–5.1)
Sodium: 140 mmol/L (ref 135–145)

## 2022-03-06 LAB — CBC
HCT: 28.4 % — ABNORMAL LOW (ref 36.0–46.0)
Hemoglobin: 9.3 g/dL — ABNORMAL LOW (ref 12.0–15.0)
MCH: 35.4 pg — ABNORMAL HIGH (ref 26.0–34.0)
MCHC: 32.7 g/dL (ref 30.0–36.0)
MCV: 108 fL — ABNORMAL HIGH (ref 80.0–100.0)
Platelets: 159 10*3/uL (ref 150–400)
RBC: 2.63 MIL/uL — ABNORMAL LOW (ref 3.87–5.11)
RDW: 15.7 % — ABNORMAL HIGH (ref 11.5–15.5)
WBC: 4.1 10*3/uL (ref 4.0–10.5)
nRBC: 0 % (ref 0.0–0.2)

## 2022-03-06 LAB — MAGNESIUM: Magnesium: 1.8 mg/dL (ref 1.7–2.4)

## 2022-03-06 LAB — HEPARIN LEVEL (UNFRACTIONATED)
Heparin Unfractionated: 0.44 IU/mL (ref 0.30–0.70)
Heparin Unfractionated: 0.22 IU/mL — ABNORMAL LOW (ref 0.30–0.70)

## 2022-03-06 SURGERY — EGD (ESOPHAGOGASTRODUODENOSCOPY)
Anesthesia: Monitor Anesthesia Care

## 2022-03-06 MED ORDER — ONDANSETRON HCL 4 MG/2ML IJ SOLN
INTRAMUSCULAR | Status: AC
  Filled 2022-03-06: qty 2

## 2022-03-06 MED ORDER — HYDROMORPHONE HCL 1 MG/ML IJ SOLN
0.5000 mg | Freq: Once | INTRAMUSCULAR | Status: AC
  Administered 2022-03-06: 0.5 mg via INTRAVENOUS
  Filled 2022-03-06: qty 0.5

## 2022-03-06 MED ORDER — HYDROMORPHONE HCL 1 MG/ML IJ SOLN
0.5000 mg | INTRAMUSCULAR | Status: DC | PRN
  Administered 2022-03-06 – 2022-03-07 (×4): 0.5 mg via INTRAVENOUS
  Filled 2022-03-06 (×4): qty 0.5

## 2022-03-06 MED ORDER — ACETAMINOPHEN 10 MG/ML IV SOLN
1000.0000 mg | Freq: Once | INTRAVENOUS | Status: AC
  Administered 2022-03-06: 1000 mg via INTRAVENOUS
  Filled 2022-03-06: qty 100

## 2022-03-06 MED ORDER — HYDROMORPHONE HCL 1 MG/ML IJ SOLN
0.2500 mg | Freq: Once | INTRAMUSCULAR | Status: AC | PRN
  Administered 2022-03-06: 0.25 mg via INTRAVENOUS
  Filled 2022-03-06: qty 0.5

## 2022-03-06 MED ORDER — PROPOFOL 500 MG/50ML IV EMUL
INTRAVENOUS | Status: DC | PRN
  Administered 2022-03-06: 200 ug/kg/min via INTRAVENOUS

## 2022-03-06 MED ORDER — SODIUM CHLORIDE 0.9 % IV SOLN: INTRAVENOUS | Status: DC

## 2022-03-06 MED ORDER — FENTANYL CITRATE (PF) 100 MCG/2ML IJ SOLN
INTRAMUSCULAR | Status: AC
Start: 2022-03-06 — End: ?
  Filled 2022-03-06: qty 2

## 2022-03-06 MED ORDER — HYDROMORPHONE HCL 1 MG/ML IJ SOLN
0.5000 mg | INTRAMUSCULAR | Status: DC | PRN
  Administered 2022-03-06: 0.5 mg via INTRAVENOUS
  Filled 2022-03-06: qty 0.5

## 2022-03-06 MED ORDER — LACTATED RINGERS IV SOLN: INTRAVENOUS | Status: DC | PRN

## 2022-03-06 MED ORDER — PROPOFOL 10 MG/ML IV BOLUS
INTRAVENOUS | Status: AC
  Filled 2022-03-06: qty 20

## 2022-03-06 MED ORDER — FENTANYL CITRATE (PF) 100 MCG/2ML IJ SOLN
INTRAMUSCULAR | Status: AC
  Filled 2022-03-06: qty 2

## 2022-03-06 MED ORDER — KETAMINE HCL 10 MG/ML IJ SOLN
INTRAMUSCULAR | Status: DC | PRN
  Administered 2022-03-06 (×5): 5 mg via INTRAVENOUS

## 2022-03-06 MED ORDER — GLYCOPYRROLATE 0.2 MG/ML IJ SOLN
INTRAMUSCULAR | Status: DC | PRN
  Administered 2022-03-06: .1 mg via INTRAVENOUS

## 2022-03-06 MED ORDER — LIDOCAINE HCL (CARDIAC) PF 100 MG/5ML IV SOSY
PREFILLED_SYRINGE | INTRAVENOUS | Status: DC | PRN
  Administered 2022-03-06: 60 mg via INTRAVENOUS

## 2022-03-06 MED ORDER — ACETAMINOPHEN 160 MG/5ML PO SOLN: 500.0000 mg | Freq: Four times a day (QID) | ORAL | Status: DC | PRN

## 2022-03-06 MED ORDER — MIDAZOLAM HCL 5 MG/5ML IJ SOLN
INTRAMUSCULAR | Status: DC | PRN
  Administered 2022-03-06: 1 mg via INTRAVENOUS

## 2022-03-06 MED ORDER — KETAMINE HCL 10 MG/ML IJ SOLN
INTRAMUSCULAR | Status: AC
  Filled 2022-03-06: qty 1

## 2022-03-06 MED ORDER — MIDAZOLAM HCL 2 MG/2ML IJ SOLN
INTRAMUSCULAR | Status: AC
  Filled 2022-03-06: qty 2

## 2022-03-06 MED ORDER — LACTATED RINGERS IV SOLN
INTRAVENOUS | Status: AC
Start: 2022-03-06 — End: 2022-03-07

## 2022-03-06 MED ORDER — FENTANYL CITRATE (PF) 100 MCG/2ML IJ SOLN
INTRAMUSCULAR | Status: DC | PRN
  Administered 2022-03-06: 50 ug via INTRAVENOUS

## 2022-03-06 MED ORDER — HEPARIN (PORCINE) 25000 UT/250ML-% IV SOLN
1150.0000 [IU]/h | INTRAVENOUS | Status: DC
  Administered 2022-03-06: 1150 [IU]/h via INTRAVENOUS
  Administered 2022-03-06: 1100 [IU]/h via INTRAVENOUS
  Filled 2022-03-06: qty 250

## 2022-03-06 NOTE — Anesthesia Preprocedure Evaluation (Addendum)
Anesthesia Evaluation  ?Patient identified by MRN, date of birth, ID band ?Patient awake ? ? ? ?Reviewed: ?Allergy & Precautions, NPO status , Patient's Chart, lab work & pertinent test results ? ?History of Anesthesia Complications ?Negative for: history of anesthetic complications ? ?Airway ?Mallampati: II ? ?TM Distance: >3 FB ?Neck ROM: Full ? ? ? Dental ?no notable dental hx. ?(+) Partial Upper,  ?  ?Pulmonary ?neg pulmonary ROS, asthma , COPD,  COPD inhaler, Current Smoker and Patient abstained from smoking.,  ?H/o lung cancer: XRT R lung ?  ?Pulmonary exam normal ?breath sounds clear to auscultation ? ? ? ? ? ? Cardiovascular ?hypertension, Pt. on home beta blockers ?negative cardio ROS ?Normal cardiovascular exam ?Rhythm:Regular Rate:Normal ? ?11/2021 ECHO: EF 60-65%. The LV has normal function,  no regional wall motion abnormalities. Left ventricular diastolic parameters were normal. RVF is normal, no significant valvular abnormalities, pericardial effusion anterior to RV ?  ?Neuro/Psych ?PSYCHIATRIC DISORDERS Anxiety Depression negative neurological ROS ? negative psych ROS  ? GI/Hepatic ?negative GI ROS, Neg liver ROS, GERD  Medicated and Controlled,Radiation induced esophagitis ?  ?Endo/Other  ?negative endocrine ROS ? Renal/GU ?Renal disease  ?negative genitourinary ?  ?Musculoskeletal ?negative musculoskeletal ROS ?(+)  ? Abdominal ?Normal abdominal exam  (+)   ?Peds ?negative pediatric ROS ?(+)  Hematology ? ?(+) Blood dyscrasia (Hb 9.9), anemia , eliquis   ?Anesthesia Other Findings ?HPI: [name] is a 51 y.o. female with medical history significant for right upper lobe lung cancer s/p radiation therapy, esophageal dysphagia secondary to radiation-induced esophagitis, esophageal stenosis, COPD, acute DVT in February 2023, chronic anemia with baseline hemoglobin 8.5-12, who is admitted to [facility] on 03/03/2022 with food impaction after presenting from  home to [facility] ED complaining of such.  ? Reproductive/Obstetrics ?negative OB ROS ? ?  ? ? ? ? ? ? ? ? ? ? ? ? ? ?  ?  ? ? ? ? ? ? ?Anesthesia Physical ? ?Anesthesia Plan ? ?ASA: 4 ? ?Anesthesia Plan: MAC  ? ?Post-op Pain Management: Minimal or no pain anticipated  ? ?Induction: Intravenous ? ?PONV Risk Score and Plan: 2 and Ondansetron, Treatment may vary due to age or medical condition and Propofol infusion ? ?Airway Management Planned: Natural Airway, Simple Face Mask and Nasal Cannula ? ?Additional Equipment: None ? ?Intra-op Plan:  ? ?Post-operative Plan:  ? ?Informed Consent:  ? ?Plan Discussed with: Anesthesiologist and CRNA ? ?Anesthesia Plan Comments: (  )  ? ? ? ? ? ?Anesthesia Quick Evaluation                                  Anesthesia Evaluation  ?Patient identified by MRN, date of birth, ID band ?Patient awake ? ? ? ?Reviewed: ?Allergy & Precautions, NPO status , Patient's Chart, lab work & pertinent test results ? ?Airway ?Mallampati: II ? ?TM Distance: >3 FB ?Neck ROM: Full ? ? ? Dental ? ?(+) Partial Upper, Missing,  ?  ?Pulmonary ?asthma , COPD,  COPD inhaler, Current Smoker and Patient abstained from smoking.,  ?H/o lung cancer: XRT R lung ?  ?Pulmonary exam normal ? ? ? ? ? ? ? Cardiovascular ?hypertension, Pt. on home beta blockers ?Normal cardiovascular exam ? ?11/2021 ECHO: EF 60-65%. The LV has normal function,  no regional wall motion abnormalities. Left ventricular diastolic parameters were normal. RVF is normal, no significant valvular abnormalities, pericardial effusion anterior to  RV ?  ?Neuro/Psych ?PSYCHIATRIC DISORDERS Anxiety Depression   ? GI/Hepatic ?GERD  Medicated and Controlled,Radiation induced esophagitis ?  ?Endo/Other  ? ? Renal/GU ?  ? ?  ?Musculoskeletal ? ? Abdominal ?Normal abdominal exam  (+)   ?Peds ? Hematology ?eliquis   ?Anesthesia Other Findings ? ? Reproductive/Obstetrics ? ?  ? ? ? ? ? ? ? ? ? ? ? ? ? ?  ?  ? ? ? ? ? ? ? ? ?Anesthesia Physical ? ?Anesthesia  Plan ? ?ASA: 3 ? ?Anesthesia Plan: General  ? ?Post-op Pain Management:   ? ?Induction: Intravenous, Rapid sequence and Cricoid pressure planned ? ?PONV Risk Score and Plan: 2 and Ondansetron, Dexamethasone, Treatment may vary due to age or medical condition and Midazolam ? ?Airway Management Planned: Oral ETT ? ?Additional Equipment: None ? ?Intra-op Plan:  ? ?Post-operative Plan: Extubation in OR ? ?Informed Consent: I have reviewed the patients History and Physical, chart, labs and discussed the procedure including the risks, benefits and alternatives for the proposed anesthesia with the patient or authorized representative who has indicated his/her understanding and acceptance.  ? ? ? ?Dental advisory given ? ?Plan Discussed with: CRNA ? ?Anesthesia Plan Comments: (Lab Results ?     Component                Value               Date                 ?     WBC                      6.7                 02/11/2022           ?     HGB                      9.9 (L)             02/11/2022           ?     HCT                      30.2 (L)            02/11/2022           ?     MCV                      103.1 (H)           02/11/2022           ?     PLT                      197                 02/11/2022           ?Lab Results ?     Component                Value               Date                 ?     NA  140                 02/11/2022           ?     K                        3.2 (L)             02/11/2022           ?     CO2                      28                  02/11/2022           ?     GLUCOSE                  80                  02/11/2022           ?     BUN                      8                   02/11/2022           ?     CREATININE               0.92                02/11/2022           ?     CALCIUM                  9.3                 02/11/2022           ?     GFRNONAA                 >60                 02/11/2022          )  ? ? ? ? ? ? ?Anesthesia Quick Evaluation ? ? ?

## 2022-03-06 NOTE — Progress Notes (Signed)
ANTICOAGULATION CONSULT NOTE ? ?Pharmacy Consult for IV heparin ?Indication: history of DVT (PTA Apixaban on hold) ? ?Allergies  ?Allergen Reactions  ? Naproxen Nausea Only  ? Bactrim [Sulfamethoxazole-Trimethoprim] Nausea Only  ? ? ?Patient Measurements: ?Height: 5\' 8"  (172.7 cm) ?Weight: 64 kg (141 lb) ?IBW/kg (Calculated) : 63.9 ?Heparin Dosing Weight: actual body weight  ? ?Vital Signs: ?Temp: 97.7 ?F (36.5 ?C) (04/12 1256) ?Temp Source: Oral (04/12 1256) ?BP: 88/62 (04/12 1340) ?Pulse Rate: 114 (04/12 1340) ? ?Labs: ?Recent Labs  ?  03/04/22 ?0405 03/04/22 ?1155 03/04/22 ?1155 03/04/22 ?2009 03/05/22 ?1607 03/06/22 ?0440  ?HGB 10.3*  --   --   --  8.8* 9.3*  ?HCT 31.7*  --   --   --  26.2* 28.4*  ?PLT 229  --   --   --  174 159  ?APTT  --  32  --   --   --   --   ?LABPROT 13.8  --   --   --   --   --   ?INR 1.1  --   --   --   --   --   ?HEPARINUNFRC  --  <0.10*   < > 0.34 0.44 0.44  ?CREATININE 0.77  --   --   --  0.79 0.81  ? < > = values in this interval not displayed.  ? ? ? ?Estimated Creatinine Clearance: 83.8 mL/min (by C-G formula based on SCr of 0.81 mg/dL). ? ? ?Medical History: ?Past Medical History:  ?Diagnosis Date  ? Ankle sprain   ? left  ? Anxiety   ? treated for panic attacks in the past.  ? Asthma   ? COPD (chronic obstructive pulmonary disease) ([place])   ? Depression   ? History of radiation therapy   ? Right lung- 09/26/21-11/13/21- Dr. Gery Pray  ? Irregular heart rate   ? Lung cancer ([place])   ? Renal disorder   ? kidney infections  ? ? ?Medications:  ?PTA Apixaban 5mg  PO BID-last dose 03/02/2022 at 2100 ? ?Assessment: ?82 y/oF with PMH of small cell lung cancer, esophageal stricture, upper extremity DVT in the setting of PICC line (12/20/2021) on Apixaban PTA who presented to [facility] ED with impacted food bolus. Apixaban held on admission. Underwent EGD and food disimpaction 4/9. GI plans for EGD with esophageal stent placement on 4/12. Pharmacy consulted for IV heparin dosing while Apixaban on  hold due to need for GI procedures. Baseline heparin level < 0.1 units/mL, aPTT 32 seconds.  ? ?03/06/2022: ?IV heparin held at 7am for EGD with esophageal stent placement today ?Post-procedure, per GI, ok to resume IV heparin at 3pm if doing well. Per Dr. Darrick Meigs, ok to resume. ?CBC: Hgb 9.3, low but increased from previous day. Pltc WNL, but trending down ?No bleeding issues reported by RN  ? ?Goal of Therapy:  ?Heparin level 0.3-0.7 units/mL ?Monitor platelets by anticoagulation protocol: Yes ?  ?Plan:  ?Resume heparin infusion at 1100 units/hr ?Heparin level 6 hours after resumption  ?Daily CBC, heparin level  ?Monitor closely for s/sx of bleeding ?F/u for transition back to Apixaban 4/13 as per GI recommendations  ? ? ?Lindell Spar, PharmD, BCPS ?Clinical Pharmacist ?03/06/2022 3:02 PM ? ?

## 2022-03-06 NOTE — Progress Notes (Signed)
At start of shift, pt c/o headache that started this afternoon after radiation therapy. States fentanyl not helping headache. Night provider notified and fioricet given with moderate relief and pt able to sleep off and on. Later pt awoke stating pain 10/10 all over head; no neuro changes noted. Pt restless and crying due to pain. PRN fentanyl given without relief. Provider notified and IV dilaudid given with good effect and pt reports being comfortable and then able to return to sleep.  ? ?O2 sats  89-91% RA during night, pt denies SOB. Placed on 2Lnc and sats >92%.  ?

## 2022-03-06 NOTE — Progress Notes (Signed)
ANTICOAGULATION CONSULT NOTE ? ?Pharmacy Consult for IV heparin ?Indication: history of DVT (PTA Apixaban on hold) ? ?Allergies  ?Allergen Reactions  ? Naproxen Nausea Only  ? Bactrim [Sulfamethoxazole-Trimethoprim] Nausea Only  ? ? ?Patient Measurements: ?Height: 5\' 8"  (172.7 cm) ?Weight: 64 kg (141 lb) ?IBW/kg (Calculated) : 63.9 ?Heparin Dosing Weight: actual body weight  ? ?Vital Signs: ?Temp: 98.1 ?F (36.7 ?C) (04/12 2010) ?Temp Source: Oral (04/12 2010) ?BP: 120/87 (04/12 2010) ?Pulse Rate: 115 (04/12 2010) ? ?Labs: ?Recent Labs  ?  03/04/22 ?0405 03/04/22 ?1155 03/04/22 ?2009 03/05/22 ?3612 03/06/22 ?0440 03/06/22 ?2144  ?HGB 10.3*  --   --  8.8* 9.3*  --   ?HCT 31.7*  --   --  26.2* 28.4*  --   ?PLT 229  --   --  174 159  --   ?APTT  --  32  --   --   --   --   ?LABPROT 13.8  --   --   --   --   --   ?INR 1.1  --   --   --   --   --   ?HEPARINUNFRC  --  <0.10*   < > 0.44 0.44 0.22*  ?CREATININE 0.77  --   --  0.79 0.81  --   ? < > = values in this interval not displayed.  ? ? ? ?Estimated Creatinine Clearance: 83.8 mL/min (by C-G formula based on SCr of 0.81 mg/dL). ? ? ?Medical History: ?Past Medical History:  ?Diagnosis Date  ? Ankle sprain   ? left  ? Anxiety   ? treated for panic attacks in the past.  ? Asthma   ? COPD (chronic obstructive pulmonary disease) ([place])   ? Depression   ? History of radiation therapy   ? Right lung- 09/26/21-11/13/21- Dr. Gery Pray  ? Irregular heart rate   ? Lung cancer ([place])   ? Renal disorder   ? kidney infections  ? ? ?Medications:  ?PTA Apixaban 5mg  PO BID-last dose 03/02/2022 at 2100 ? ?Assessment: ?22 y/oF with PMH of small cell lung cancer, esophageal stricture, upper extremity DVT in the setting of PICC line (12/20/2021) on Apixaban PTA who presented to [facility] ED with impacted food bolus. Apixaban held on admission. Underwent EGD and food disimpaction 4/9. GI plans for EGD with esophageal stent placement on 4/12. Pharmacy consulted for IV heparin dosing while Apixaban on  hold due to need for GI procedures. Baseline heparin level < 0.1 units/mL, aPTT 32 seconds.  ? ?4/12:  Heparin stopped for EGD from 0700-1500 ? ?03/06/2022: ?Heparin level 0.22- subtherapeutic on 1100 units/hr however previously therapeutic on this rate ?CBC: Hgb 9.3, low but increased from previous day. Pltc WNL, but trending down ?No bleeding or infusion related issues reported by RN  ? ?Goal of Therapy:  ?Heparin level 0.3-0.7 units/mL ?Monitor platelets by anticoagulation protocol: Yes ?  ?Plan:  ?Slightly increase heparin infusion to 1150 units/hr ?Recheck Heparin level at 0600  ?Daily CBC, heparin level  ?Monitor closely for s/sx of bleeding ?F/u for transition back to Apixaban 4/13 as per GI recommendations  ? ? ?Netta Cedars, PharmD, BCPS ?Clinical Pharmacist ?03/06/2022 11:07 PM ? ?

## 2022-03-06 NOTE — Progress Notes (Signed)
[facility] ? ?PROGRESS NOTE ? ?[name] NWG:[id number] DOB: 07-29-1971 DOA: 03/03/2022 ?PCP: Pcp, No ? ? 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  GI was consulted.  Patient underwent EGD with food disimpaction on 03/03/2022.  GI recommended admission to hospital with repeat EGD with stenting on 03/06/2022. ? ? ? ?Subjective  ? ?Patient seen and examined, plan to undergo EGD with stenting today. ? ? Assessment/Plan:  ? ? ? ?Food impaction ?-History of esophageal stenosis/radiation induced esophagitis ?-Status post EGD for food disimpaction on 03/03/2022. ?-Plan for EGD and stenting today as per GI. ?-Continue Protonix 40 mg IV every 12 hours, sucralfate 1 g 4 times a day ? ?Hypokalemia ?-Replete ? ?Hypomagnesemia ?-Replete ? ?History of acute DVT ?-Currently on Eliquis as outpatient for acute DVT diagnosed in February 2023 ?-Eliquis on hold in the hospital, currently on IV heparin ?-As per GI will discontinue heparin in a.m. and start Eliquis if continues to do well. ? ?Hyponatremia ?-Resolved ? ?Anemia chronic disease ?-Hemoglobin stable ? ?COPD ?-Respiratory status stable ?-Continue as needed albuterol ? ?History of lung cancer with radiation therapy ?-Follow-up oncology as outpatient ? ? ? ?Medications ? ?  ? feeding supplement  1 Container Oral TID BM  ? mouth rinse  15 mL Mouth Rinse BID  ? pantoprazole (PROTONIX) IV  40 mg Intravenous Q12H  ? sucralfate  1 g Oral TID WC & HS  ? ? ? Data Reviewed:  ? ?CBG: ? ?No results for input(s): GLUCAP in the last 168 hours. ? ?SpO2: 94 % ?O2 Flow Rate (L/min): 3 L/min  ? ? ?Vitals:  ? 03/06/22 1313 03/06/22 1320 03/06/22 1330 03/06/22 1340  ?BP:  (!) 144/132 (!) 124/93 (!) 88/62  ?Pulse: (!) 117 (!) 116 (!) 116 (!) 114   ?Resp: (!) 21 (!) 21 (!) 21 19  ?Temp:      ?TempSrc:      ?SpO2: 93% 94% 95% 94%  ?Weight:      ?Height:      ? ? ? ? ?Data Reviewed: ? ?Basic Metabolic Panel: ?Recent Labs  ?Lab 03/03/22 ?1741 03/04/22 ?0405 03/05/22 ?5784 03/06/22 ?0440  ?NA 139 133* 139 140  ?K 2.9* 4.8 3.4* 4.0  ?CL 110 109 114* 113*  ?CO2 20* 19* 21* 22  ?GLUCOSE 99 131* 87 94  ?BUN 11 10 6  <5*  ?CREATININE 1.14* 0.77 0.79 0.81  ?CALCIUM 10.2 9.3 9.2 9.2  ?MG  --  1.7 1.6* 1.8  ? ? ?CBC: ?Recent Labs  ?Lab 03/03/22 ?1741 03/04/22 ?0405 03/05/22 ?6962 03/06/22 ?0440  ?WBC 7.3 5.8 5.0 4.1  ?NEUTROABS  --  5.1 3.1  --   ?HGB 12.0 10.3* 8.8* 9.3*  ?HCT 35.7* 31.7* 26.2* 28.4*  ?MCV 104.1* 105.7* 105.6* 108.0*  ?PLT 259 229 174 159  ? ? ?LFT ?Recent Labs  ?Lab 03/04/22 ?0405  ?AST 19  ?ALT 12  ?ALKPHOS 53  ?BILITOT 0.9  ?PROT 6.6  ?ALBUMIN 3.7  ? ?  ?Antibiotics: ?Anti-infectives (From admission, onward)  ? ? None  ? ?  ? ? ? ?DVT prophylaxis: Heparin ? ?Code Status: Full code ? ?Family Communication: No family at bedside ? ? ?CONSULTS gastroenterology ? ? ?Objective  ? ? ?Physical Examination: ? ? ?General-appears in no acute distress ?Heart-S1-S2, regular, no murmur  auscultated ?Lungs-clear to auscultation bilaterally, no wheezing or crackles auscultated ?Abdomen-soft, nontender, no organomegaly ?Extremities-no edema in the lower extremities ?Neuro-alert, oriented x3, no focal deficit noted ? ? ?Status is: Inpatient: Radiation-induced esophagitis ? ?  ? ? ?Oswald Hillock ?  ?[facility] ?If 7PM-7AM, please contact night-coverage at [email or web address], ?Office  [phone] ? ? ?03/06/2022, 3:01 PM  LOS: 2 days  ? ? ? ? ? ? ? ? ? ? ?  ?

## 2022-03-06 NOTE — Transfer of Care (Signed)
Immediate Anesthesia Transfer of Care Note ? ?Patient: [name] ? ?Procedure(s) Performed: ESOPHAGOGASTRODUODENOSCOPY (EGD) ?ESOPHAGEAL STENT PLACEMENT ? ?Patient Location: PACU ? ?Anesthesia Type:MAC ? ?Level of Consciousness: drowsy and patient cooperative ? ?Airway & Oxygen Therapy: Patient Spontanous Breathing and Patient connected to nasal cannula oxygen ? ?Post-op Assessment: Report given to RN and Post -op Vital signs reviewed and stable ? ?Post vital signs: Reviewed and stable ? ?Last Vitals:  ?Vitals Value Taken Time  ?BP 150/117 03/06/22 1256  ?Temp 36.5 ?C 03/06/22 1256  ?Pulse 116 03/06/22 1300  ?Resp 27 03/06/22 1300  ?SpO2 97 % 03/06/22 1300  ?Vitals shown include unvalidated device data. ? ?Last Pain:  ?Vitals:  ? 03/06/22 1256  ?TempSrc: Oral  ?PainSc: 10-Worst pain ever  ?   ? ?Patients Stated Pain Goal: 7 (03/06/22 0200) ? ?Complications: No notable events documented. ?

## 2022-03-06 NOTE — Progress Notes (Signed)
ANTICOAGULATION CONSULT NOTE ? ?Pharmacy Consult for IV heparin ?Indication: history of DVT (PTA Apixaban on hold) ? ?Allergies  ?Allergen Reactions  ? Naproxen Nausea Only  ? Bactrim [Sulfamethoxazole-Trimethoprim] Nausea Only  ? ? ?Patient Measurements: ?Height: 5\' 8"  (172.7 cm) ?Weight: 64.3 kg (141 lb 12.8 oz) ?IBW/kg (Calculated) : 63.9 ?Heparin Dosing Weight: actual body weight  ? ?Vital Signs: ?Temp: 99.1 ?F (37.3 ?C) (04/11 2351) ?Temp Source: Oral (04/11 2351) ?BP: 108/75 (04/12 0218) ?Pulse Rate: 120 (04/11 2030) ? ?Labs: ?Recent Labs  ?  03/04/22 ?0405 03/04/22 ?1155 03/04/22 ?1155 03/04/22 ?2009 03/05/22 ?1324 03/06/22 ?0440  ?HGB 10.3*  --   --   --  8.8* 9.3*  ?HCT 31.7*  --   --   --  26.2* 28.4*  ?PLT 229  --   --   --  174 159  ?APTT  --  32  --   --   --   --   ?LABPROT 13.8  --   --   --   --   --   ?INR 1.1  --   --   --   --   --   ?HEPARINUNFRC  --  <0.10*   < > 0.34 0.44 0.44  ?CREATININE 0.77  --   --   --  0.79 0.81  ? < > = values in this interval not displayed.  ? ? ? ?Estimated Creatinine Clearance: 83.8 mL/min (by C-G formula based on SCr of 0.81 mg/dL). ? ? ?Medical History: ?Past Medical History:  ?Diagnosis Date  ? Ankle sprain   ? left  ? Anxiety   ? treated for panic attacks in the past.  ? Asthma   ? COPD (chronic obstructive pulmonary disease) ([place])   ? Depression   ? History of radiation therapy   ? Right lung- 09/26/21-11/13/21- Dr. Gery Pray  ? Irregular heart rate   ? Lung cancer ([place])   ? Renal disorder   ? kidney infections  ? ? ?Medications:  ?PTA Apixaban 5mg  PO BID-last dose 03/02/2022 at 2100 ? ?Assessment: ?63 y/oF with PMH of small cell lung cancer, esophageal stricture, upper extremity DVT in the setting of PICC line (12/20/2021) on Apixaban PTA who presented to [facility] ED with impacted food bolus. Apixaban held on admission. Underwent EGD and food disimpaction 4/9. GI plans for EGD with esophageal stent placement on 4/12. Pharmacy consulted for IV heparin dosing while  Apixaban on hold due to need for GI procedures. CBC: Hgb low/decreased to 10.3, Pltc WNL. ? ?03/06/2022: ?Heparin level 0.44- therapeutic on IV heparin 1100 units/hr ?CBC: Hg 9.3 low but increased from previous day; pltc WNL but trending down ?No bleeding or infusion related issues reported by RN  ? ?Goal of Therapy:  ?aPTT 66-102 seconds ?Heparin level 0.3-0.7 units/mL ?Monitor platelets by anticoagulation protocol: Yes ?  ?Plan:  ?Continue heparin infusion at 1100 units/hr ?D/C Heparin at 0700 for procedure ?Daily CBC, heparin level while on heparin ?Monitor closely for s/sx of bleeding ?F/U anticoagulation plans post-procedure ? ?Netta Cedars, PharmD, BCPS ?Clinical Pharmacist ?03/06/2022 5:33 AM ? ?

## 2022-03-06 NOTE — Progress Notes (Signed)
[facility] Progress Note ? ?Lynley Killilea Vanzandt 51 y.o. Jul 20, 1971 ? ?CC:  Esophageal stricture, recurrent food impactions ? ? ?Subjective: ?Patient resting in bed this morning, had some nausea overnight as well as severe headache following radiation yesterday.  Still having chest discomfort and heartburn, not worsening since yesterday, improved since starting sucralfate. Denies vomiting, fever, chills.  Heparin drip stopped at 0700 and patient is NPO. No bowel movement overnight. ? ?ROS : Review of Systems  ?Constitutional:  Negative for chills and fever.  ?Gastrointestinal:  Positive for heartburn and nausea. Negative for abdominal pain, blood in stool, constipation, diarrhea, melena and vomiting.  ?Genitourinary:  Negative for dysuria and urgency.   ? ? ?Objective: ?Vital signs in last 24 hours: ?Vitals:  ? 03/06/22 0218 03/06/22 0559  ?BP: 108/75 120/88  ?Pulse:  (!) 118  ?Resp:  18  ?Temp:  99.2 ?F (37.3 ?C)  ?SpO2: 93% 93%  ? ? ?Physical Exam: ?  ?General:  Alert, cooperative, no distress, thin and chronically ill-appearing  ?Head:  Normocephalic, without obvious abnormality, atraumatic  ?Eyes:  Anicteric sclera, EOM's intact  ?Lungs:   Clear to auscultation bilaterally, respirations unlabored  ?Heart:  Regular rate and rhythm, S1, S2 normal  ?Abdomen:   Soft, non-tender, bowel sounds active all four quadrants,  no masses,   ?Extremities: Extremities normal, atraumatic, no  edema  ?Pulses: 2+ and symmetric  ? ? ? ?Lab Results: ?Recent Labs  ?  03/05/22 ?0737 03/06/22 ?0440  ?NA 139 140  ?K 3.4* 4.0  ?CL 114* 113*  ?CO2 21* 22  ?GLUCOSE 87 94  ?BUN 6 <5*  ?CREATININE 0.79 0.81  ?CALCIUM 9.2 9.2  ?MG 1.6* 1.8  ? ?Recent Labs  ?  03/04/22 ?0405  ?AST 19  ?ALT 12  ?ALKPHOS 53  ?BILITOT 0.9  ?PROT 6.6  ?ALBUMIN 3.7  ? ?Recent Labs  ?  03/04/22 ?0405 03/05/22 ?1062 03/06/22 ?0440  ?WBC 5.8 5.0 4.1  ?NEUTROABS 5.1 3.1  --   ?HGB 10.3* 8.8* 9.3*  ?HCT 31.7* 26.2* 28.4*  ?MCV 105.7* 105.6* 108.0*  ?PLT 229 174  159  ? ?Recent Labs  ?  03/04/22 ?0405  ?LABPROT 13.8  ?INR 1.1  ? ? ? ? ?Assessment ?Radiation-induced esophageal stricture, recurrent food impactions ?- EGD 03/03/22 - Food in mid esophagus, successfully removed.  Benign-appearing esophageal stenosis.  Lesion not amenable to dilation and not attempted. Severe esophagitis with bleeding. ?  ?- Hgb 9.3 (8.8) ?- No leukocytosis ?- BUN <5, Cr 0.81, GFR >60 ? ? ?Plan: ?Planning for EGD w/ esophageal stent placement today with Dr. Watt Climes.  ?Continue Protonix IV 40mg  BID ?Continue carafate suspension. ?NPO ?Eliquis on hold. ?Heparin held starting at 0700 this morning. ?Continue supportive care ?[facility] will follow.    ? ?Angelique Holm PA-C ?03/06/2022, 8:43 AM ? ?Contact #  [phone]  ?

## 2022-03-06 NOTE — Op Note (Signed)
[facility] ?Patient Name: [name] ?Procedure Date: 03/06/2022 ?MRN: [id number] ?Attending MD: Clarene Essex , MD ?Date of Birth: [birth year] ?CSN: [id number] ?Age: 51 ?Admit Type: Inpatient ?Procedure:                Upper GI endoscopy ?Indications:              Dysphagia, For therapy of esophageal injury due to  ?                          caustic ingestion ?Providers:                Clarene Essex, MD, Allayne Gitelman, RN, William Dalton,  ?                          Technician, Gloris Ham, Technician ?Referring MD:              ?Medicines:                Propofol per Anesthesia ?Complications:            No immediate complications. Estimated blood loss:  ?                          None. ?Estimated Blood Loss:     Estimated blood loss: none. ?Procedure:                Pre-Anesthesia Assessment: ?                          - Prior to the procedure, a History and Physical  ?                          was performed, and patient medications and  ?                          allergies were reviewed. The patient's tolerance of  ?                          previous anesthesia was also reviewed. The risks  ?                          and benefits of the procedure and the sedation  ?                          options and risks were discussed with the patient.  ?                          All questions were answered, and informed consent  ?                          was obtained. Prior Anticoagulants: The patient has  ?                          taken heparin, last dose was day of procedure. ASA  ?  Grade Assessment: III - A patient with severe  ?                          systemic disease. After reviewing the risks and  ?                          benefits, the patient was deemed in satisfactory  ?                          condition to undergo the procedure. ?                          After obtaining informed consent, the endoscope was  ?                          passed under direct vision.  Throughout the  ?                          procedure, the patient's blood pressure, pulse, and  ?                          oxygen saturations were monitored continuously. The  ?                          [id number] ([id number]) Olympus slim endoscope was  ?                          introduced through the mouth, and advanced to the  ?                          second part of duodenum. The upper GI endoscopy was  ?                          somewhat difficult due to abnormal anatomy.  ?                          Successful completion of the procedure was aided by  ?                          performing the maneuvers documented (below) in this  ?                          report. The patient tolerated the procedure well. ?Scope In: ?Scope Out: ?Findings: ?     The larynx was normal. ?     One benign-appearing, intrinsic moderate (circumferential scarring or  ?     stenosis; an endoscope may pass) stenosis was found 23 cm from the  ?     incisors. The stenosis was easily traversed using the ultraslim upper  ?     scope. This was stented with an 18 mm x 10.3 cm WallFlex covered stent  ?     under fluoroscopic guidance. Estimated blood loss: none. At the end of  ?     the procedure once our landmarks were obtained the wire was advanced  ?  into the small bowel under fluoroscopy guidance and the scope was  ?     removed making sure the wire stayed in the proper position and once the  ?     scope was removed the stent was inserted and under fluoroscopy guidance  ?     seem to be in the correct spot and the scope was reinserted just above  ?     the stricture to the proximal esophagus and the stent was released under  ?     endoscopic guidance in the customary fashion making sure to keep the  ?     stent in the proper position and once released the introducer and wire  ?     were removed and the scope was able to be advanced through the stent and  ?     there was no obvious signs of bleeding or problems in the stent was seen  ?      just above the stricture in the proximal esophagus and the scope was  ?     removed ?     The entire examined stomach was normal. ?     The duodenal bulb, first portion of the duodenum and second portion of  ?     the duodenum were normal. ?     The exam was otherwise without abnormality. ?Impression:               - Normal larynx. ?                          - Benign-appearing esophageal stenosis. Prosthesis  ?                          placed. ?                          - Normal stomach. ?                          - Normal duodenal bulb, first portion of the  ?                          duodenum and second portion of the duodenum. ?                          - The examination was otherwise normal. ?                          - No specimens collected. ?Moderate Sedation: ?     Not Applicable - Patient had care per Anesthesia. ?Recommendation:           - Clear liquid diet today. If doing well tomorrow  ?                          may slowly advance diet to the customary post  ?                          stenting diet to include antireflux measures as  ?  well as soft solids taking small bites chewing her  ?                          food well and drinking plenty of liquids before and  ?                          after eating ?                          - Continue present medications. ?                          - Resume heparin at prior dose today at 3 PM if  ?                          doing well. And if doing well tomorrow without  ?                          problems may stop heparin and resume Eliquis and  ?                          hopefully can go home soon ?                          - Return to GI clinic PRN. She was supposed to look  ?                          into an outpatient gastroenterologist who takes her  ?                          insurance ?                          - Telephone GI clinic if symptomatic PRN. ?                          - Refer to a dietitian tomorrow to discuss post  ?                           stent diet including all of the recommendations as  ?                          above. ?Procedure Code(s):        --- Professional --- ?                          [phone], Esophagogastroduodenoscopy, flexible,  ?                          transoral; with placement of endoscopic stent  ?                          (includes pre- and post-dilation and guide wire  ?  passage, when performed) ?Diagnosis Code(s):        --- Professional --- ?                          K22.2, Esophageal obstruction ?                          R13.10, Dysphagia, unspecified ?                          T54.94XA, Toxic effect of unspecified corrosive  ?                          substance, undetermined, initial encounter ?                          T28.6XXA, Corrosion of esophagus, initial encounter ?CPT copyright 2019 [employer]. All rights reserved. ?The codes documented in this report are preliminary and upon coder review may  ?be revised to meet current compliance requirements. ?Clarene Essex, MD ?03/06/2022 1:05:30 PM ?This report has been signed electronically. ?Number of Addenda: 0 ?

## 2022-03-06 NOTE — Progress Notes (Signed)
[name] ?12:12 PM ? ?Subjective: ?Patient seen and examined please see PA notes for details hospital computer chart reviewed and case discussed with my partner Dr. Paulita Fujita and she has no new complaints ? ?Objective: ?Vital signs stable afebrile exam please see preassessment evaluation labs reviewed ? ?Assessment: ?Multiple food impactions in patient with radiation esophageal stricture ? ?Plan: ?The risk benefits methods of esophageal stent was discussed as  well as postprocedural discomfort and still needing to be careful with eating chewing her food well taking small bites drinking plenty of liquids etc. and will proceed today with anesthesia assistance ?[facility] ? ?office [phone] ?After 5PM or if no answer call [phone]  ?

## 2022-03-06 NOTE — Anesthesia Postprocedure Evaluation (Signed)
Anesthesia Post Note ? ?Patient: TANNA LOEFFLER ? ?Procedure(s) Performed: ESOPHAGOGASTRODUODENOSCOPY (EGD) ?ESOPHAGEAL STENT PLACEMENT ? ?  ? ?Patient location during evaluation: PACU ?Anesthesia Type: MAC ?Level of consciousness: awake and alert ?Pain management: pain level controlled ?Vital Signs Assessment: post-procedure vital signs reviewed and stable ?Respiratory status: spontaneous breathing, nonlabored ventilation, respiratory function stable and patient connected to nasal cannula oxygen ?Cardiovascular status: stable and blood pressure returned to baseline ?Postop Assessment: no apparent nausea or vomiting ?Anesthetic complications: no ? ? ?No notable events documented. ? ?Last Vitals:  ?Vitals:  ? 03/06/22 1330 03/06/22 1340  ?BP: (!) 124/93 (!) 88/62  ?Pulse: (!) 116 (!) 114  ?Resp: (!) 21 19  ?Temp:    ?SpO2: 95% 94%  ?  ?Last Pain:  ?Vitals:  ? 03/06/22 1340  ?TempSrc:   ?PainSc: 10-Worst pain ever  ? ? ?  ?  ?  ?  ?  ?  ? ?Ronin Crager ? ? ? ? ?

## 2022-03-07 ENCOUNTER — Ambulatory Visit: Payer: Medicaid Other

## 2022-03-07 ENCOUNTER — Encounter (HOSPITAL_COMMUNITY): Payer: Self-pay | Admitting: Gastroenterology

## 2022-03-07 LAB — CBC
HCT: 27.9 % — ABNORMAL LOW (ref 36.0–46.0)
Hemoglobin: 9.3 g/dL — ABNORMAL LOW (ref 12.0–15.0)
MCH: 35.2 pg — ABNORMAL HIGH (ref 26.0–34.0)
MCHC: 33.3 g/dL (ref 30.0–36.0)
MCV: 105.7 fL — ABNORMAL HIGH (ref 80.0–100.0)
Platelets: 164 10*3/uL (ref 150–400)
RBC: 2.64 MIL/uL — ABNORMAL LOW (ref 3.87–5.11)
RDW: 15.3 % (ref 11.5–15.5)
WBC: 4.3 10*3/uL (ref 4.0–10.5)
nRBC: 0 % (ref 0.0–0.2)

## 2022-03-07 LAB — HEPARIN LEVEL (UNFRACTIONATED): Heparin Unfractionated: 0.35 IU/mL (ref 0.30–0.70)

## 2022-03-07 MED ORDER — APIXABAN 5 MG PO TABS
5.0000 mg | ORAL_TABLET | Freq: Two times a day (BID) | ORAL | Status: DC
  Administered 2022-03-07: 5 mg via ORAL
  Filled 2022-03-07: qty 1

## 2022-03-07 MED ORDER — OXYCODONE HCL 5 MG PO TABS: 5.0000 mg | ORAL_TABLET | Freq: Four times a day (QID) | ORAL | 0 refills | Status: DC | PRN

## 2022-03-07 MED ORDER — PROCHLORPERAZINE EDISYLATE 10 MG/2ML IJ SOLN
5.0000 mg | Freq: Once | INTRAMUSCULAR | Status: AC | PRN
Start: 2022-03-07 — End: 2022-03-07
  Administered 2022-03-07: 5 mg via INTRAVENOUS
  Filled 2022-03-07: qty 2

## 2022-03-07 MED ORDER — SUCRALFATE 1 GM/10ML PO SUSP: 1.0000 g | Freq: Three times a day (TID) | ORAL | 0 refills | Status: DC

## 2022-03-07 MED ORDER — PANTOPRAZOLE SODIUM 40 MG PO TBEC
40.0000 mg | DELAYED_RELEASE_TABLET | Freq: Two times a day (BID) | ORAL | 2 refills | Status: DC
Start: 2022-03-07 — End: 2022-04-25

## 2022-03-07 MED ORDER — NICOTINE 21 MG/24HR TD PT24: 21.0000 mg | MEDICATED_PATCH | Freq: Every day | TRANSDERMAL | 0 refills | Status: DC

## 2022-03-07 MED ORDER — OXYCODONE HCL 5 MG PO TABS
5.0000 mg | ORAL_TABLET | ORAL | Status: DC | PRN
Start: 2022-03-07 — End: 2022-03-07
  Administered 2022-03-07: 5 mg via ORAL
  Filled 2022-03-07: qty 1

## 2022-03-07 NOTE — Progress Notes (Signed)
NUTRITION NOTE ? ?Consult received for diet education for patient who is one day out from having esophageal stent placed.  ? ?Patient, significant other, and mom in room. Patient is on Dysphagia 3, thin liquids diet. She reports eating a large breakfast without any difficulty, pain, or feelings of anything getting stuck in her esophagus.  ? ?Discussed cutting foods into bite-sized pieces prior to eating, thoroughly chewing, sips of liquid after every few bites, food that should be limited or avoided, and avoidance of carbonation.  ? ?Provided patient with "Esophageal Surgery Nutrition Therapy" and "Low Fiber Nutrition Therapy" handouts from the Academy of [employer] Dietetics and reviewed these in detail with her. ? ?Patient denies any additional nutrition-related questions or concerns at this time. Teach back used. Anticipate good compliance. ? ?Discussed with RN prior to visit to patient's room. ? ? ? ? ?Jarome Matin, MS, RD, LDN ?Registered Dietitian II ?Inpatient Clinical Nutrition ?RD pager # and on-call/weekend pager # available in [place]  ? ?

## 2022-03-07 NOTE — Progress Notes (Signed)
Approximately 20 minutes after taking PO xanax, carafate and robitussin, pt vomited onto blanket on bed. No brown/black or blood visible in emesis.  ?Provider notified and compazine given per order with good effect.  ? ?Also educated pt on reflux precautions such as sitting upright when drinking, sleeping with HOB elevated, avoiding coffee, soda or acidic drinks and when able to start eating food to take small bites, chew well and drink liquid before and after bites. Pt verbalized understanding.   ?

## 2022-03-07 NOTE — Progress Notes (Signed)
Subjective: ?Some chest discomfort before (and after) stent placement, slowly improving. ?Able to eat soft foods yesterday ? ?Objective: ?Vital signs in last 24 hours: ?Temp:  [97.7 ?F (36.5 ?C)-98.7 ?F (37.1 ?C)] 98.4 ?F (36.9 ?C) (04/13 0910) ?Pulse Rate:  [103-122] 106 (04/13 0910) ?Resp:  [14-23] 18 (04/13 0910) ?BP: (88-150)/(62-132) 125/86 (04/13 0910) ?SpO2:  [92 %-96 %] 95 % (04/13 0910) ?Weight:  [64.1 kg] 64.1 kg (04/13 0530) ?Weight change:  ?Last BM Date :  (prior to admission per patient) ? ?PE: ?GEN:  Chronically ill-appearing, older-appearing than stated age, NAD ?ABD:  Soft, non-tender ? ?Lab Results: ?CBC ?   ?Component Value Date/Time  ? WBC 4.3 03/07/2022 0501  ? RBC 2.64 (L) 03/07/2022 0501  ? HGB 9.3 (L) 03/07/2022 0501  ? HGB 11.1 (L) 01/17/2022 1049  ? HGB 14.6 02/18/2017 0932  ? HCT 27.9 (L) 03/07/2022 0501  ? HCT 44.9 02/18/2017 0932  ? PLT 164 03/07/2022 0501  ? PLT 299 01/17/2022 1049  ? PLT 461 (H) 02/18/2017 0932  ? MCV 105.7 (H) 03/07/2022 0501  ? MCV 113 (H) 02/18/2017 0932  ? MCH 35.2 (H) 03/07/2022 0501  ? MCHC 33.3 03/07/2022 0501  ? RDW 15.3 03/07/2022 0501  ? RDW 13.4 02/18/2017 0932  ? LYMPHSABS 1.2 03/05/2022 0343  ? LYMPHSABS 3.2 (H) 02/18/2017 0932  ? MONOABS 0.6 03/05/2022 0343  ? EOSABS 0.1 03/05/2022 0343  ? EOSABS 0.1 02/18/2017 0932  ? BASOSABS 0.0 03/05/2022 0343  ? BASOSABS 0.0 02/18/2017 0932  ?CMP  ?   ?Component Value Date/Time  ? NA 140 03/06/2022 0440  ? NA 139 02/18/2017 0932  ? K 4.0 03/06/2022 0440  ? CL 113 (H) 03/06/2022 0440  ? CO2 22 03/06/2022 0440  ? GLUCOSE 94 03/06/2022 0440  ? BUN <5 (L) 03/06/2022 0440  ? BUN 11 02/18/2017 0932  ? CREATININE 0.81 03/06/2022 0440  ? CREATININE 1.01 (H) 01/17/2022 1049  ? CALCIUM 9.2 03/06/2022 0440  ? PROT 6.6 03/04/2022 0405  ? ALBUMIN 3.7 03/04/2022 0405  ? AST 19 03/04/2022 0405  ? AST 11 (L) 01/17/2022 1049  ? ALT 12 03/04/2022 0405  ? ALT 9 01/17/2022 1049  ? ALKPHOS 53 03/04/2022 0405  ? BILITOT 0.9 03/04/2022  0405  ? BILITOT 0.5 01/17/2022 1049  ? GFRNONAA >60 03/06/2022 0440  ? GFRNONAA >60 01/17/2022 1049  ? GFRAA >60 05/07/2018 1521  ? ? ?Assessment: ? ? Radiation-induced proximal esophageal stricture.  Esophageal stent placed yesterday. ? ?Plan: ? ? Diet counseling per nutrition services prior to discharge for exact types of foods acceptable s/p stent placement. ?OK to resume Eliquis today from GI standpoint. ?[facility] will sign-off; will arrange outpatient follow-up with Dr. Watt Climes; thank you for consultation. ? ? [name] ?03/07/2022, 12:24 PM ? ? ?Cell [phone] ?If no answer or after 5 PM call [phone]  ?

## 2022-03-07 NOTE — Progress Notes (Signed)
?   03/07/22 0511  ?Assess: MEWS Score  ?Temp 98.7 ?F (37.1 ?C)  ?BP (!) 117/91  ?Pulse Rate (!) 122  ?Resp 18  ?SpO2 92 %  ?O2 Device Room Air;Nasal Cannula ?(per NT was in 80s on room air then placed on O2)  ?Assess: MEWS Score  ?MEWS Temp 0  ?MEWS Systolic 0  ?MEWS Pulse 2  ?MEWS RR 0  ?MEWS LOC 0  ?MEWS Score 2  ?MEWS Score Color Yellow  ?Assess: if the MEWS score is Yellow or Red  ?Were vital signs taken at a resting state? Yes  ?Focused Assessment No change from prior assessment  ?Does the patient meet 2 or more of the SIRS criteria? No  ?MEWS guidelines implemented *See Row Information* Yes  ?Treat  ?MEWS Interventions Administered prn meds/treatments ?(pain meds given and placed on O2)  ?Take Vital Signs  ?Increase Vital Sign Frequency  Yellow: Q 2hr X 2 then Q 4hr X 2, if remains yellow, continue Q 4hrs  ?Escalate  ?MEWS: Escalate Yellow: discuss with charge nurse/RN and consider discussing with provider and RRT  ?Notify: Provider  ?Provider Name/Title J. Quillian Quince, NP  ?Date Provider Notified 03/07/22  ?Time Provider Notified [phone]  ?Notification Type  ?(secure chat)  ?Notification Reason Other (Comment) ?(yellow mews, HR 110s, sats 87% RA. no SOB. placed on O2)  ?Provider response No new orders  ?Date of Provider Response 03/07/22  ?Assess: SIRS CRITERIA  ?SIRS Temperature  0  ?SIRS Pulse 1  ?SIRS Respirations  0  ?SIRS WBC 0  ?SIRS Score Sum  1  ? ? ?

## 2022-03-07 NOTE — Anesthesia Postprocedure Evaluation (Signed)
Anesthesia Post Note ? ?Patient: [name] ? ?Procedure(s) Performed: ESOPHAGOGASTRODUODENOSCOPY (EGD) ?FOREIGN BODY REMOVAL ? ?  ? ?Patient location during evaluation: PACU ?Anesthesia Type: General ?Level of consciousness: sedated ?Pain management: pain level controlled ?Vital Signs Assessment: post-procedure vital signs reviewed and stable ?Respiratory status: spontaneous breathing ?Cardiovascular status: tachycardic ?Postop Assessment: no apparent nausea or vomiting ?Anesthetic complications: no ? ? ?No notable events documented. ? ?Last Vitals:  ?Vitals:  ? 03/07/22 0615 03/07/22 0735  ?BP:  129/90  ?Pulse:  (!) 103  ?Resp:  18  ?Temp:  36.8 ?C  ?SpO2: 94% 95%  ?  ?Last Pain:  ?Vitals:  ? 03/07/22 0835  ?TempSrc:   ?PainSc: 8   ? ? ?  ?  ?  ?  ?  ?  ? ?Huston Foley ? ? ? ? ?

## 2022-03-07 NOTE — Progress Notes (Signed)
Pt to be discharged to home this afternoon. Pt given discharge teaching including all discharge Medications and schedules for these. Pt verbalized understanding of all discharge teaching/instructions. Discharge AVS with the Patient at time of discharge ?

## 2022-03-07 NOTE — Progress Notes (Signed)
ANTICOAGULATION CONSULT NOTE ? ?Pharmacy Consult for IV heparin ?Indication: history of DVT (PTA Apixaban on hold) ? ?Allergies  ?Allergen Reactions  ? Naproxen Nausea Only  ? Bactrim [Sulfamethoxazole-Trimethoprim] Nausea Only  ? ? ?Patient Measurements: ?Height: 5\' 8"  (172.7 cm) ?Weight: 64.1 kg (141 lb 6.4 oz) ?IBW/kg (Calculated) : 63.9 ?Heparin Dosing Weight: actual body weight  ? ?Vital Signs: ?Temp: 98.7 ?F (37.1 ?C) (04/13 9191) ?Temp Source: Oral (04/13 0511) ?BP: 117/91 (04/13 0511) ?Pulse Rate: 122 (04/13 0511) ? ?Labs: ?Recent Labs  ?  03/04/22 ?1155 03/04/22 ?2009 03/05/22 ?6606 03/06/22 ?0440 03/06/22 ?2144 03/07/22 ?0501 03/07/22 ?0615  ?HGB  --    < > 8.8* 9.3*  --  9.3*  --   ?HCT  --   --  26.2* 28.4*  --  27.9*  --   ?PLT  --   --  174 159  --  164  --   ?APTT 32  --   --   --   --   --   --   ?HEPARINUNFRC <0.10*   < > 0.44 0.44 0.22*  --  0.35  ?CREATININE  --   --  0.79 0.81  --   --   --   ? < > = values in this interval not displayed.  ? ? ? ?Estimated Creatinine Clearance: 83.8 mL/min (by C-G formula based on SCr of 0.81 mg/dL). ? ? ?Medical History: ?Past Medical History:  ?Diagnosis Date  ? Ankle sprain   ? left  ? Anxiety   ? treated for panic attacks in the past.  ? Asthma   ? COPD (chronic obstructive pulmonary disease) ([place])   ? Depression   ? History of radiation therapy   ? Right lung- 09/26/21-11/13/21- Dr. Gery Pray  ? Irregular heart rate   ? Lung cancer ([place])   ? Renal disorder   ? kidney infections  ? ? ?Medications:  ?PTA Apixaban 5mg  PO BID-last dose 03/02/2022 at 2100 ? ?Assessment: ?55 y/oF with PMH of small cell lung cancer, esophageal stricture, upper extremity DVT in the setting of PICC line (12/20/2021) on Apixaban PTA who presented to [facility] ED with impacted food bolus. Apixaban held on admission. Underwent EGD and food disimpaction 4/9. GI plans for EGD with esophageal stent placement on 4/12. Pharmacy consulted for IV heparin dosing while Apixaban on hold due to need for  GI procedures. Baseline heparin level < 0.1 units/mL, aPTT 32 seconds.  ? ?Significant events: ?4/12:  Heparin stopped for EGD with esophageal stent placement from 0700-1500 ? ?03/07/2022: ?Heparin level 0.35 units/mL, now therapeutic on heparin infusion at 1150 units/hr  ?CBC: Hgb 9.3, low but stable. Pltc WNL.  ?No bleeding or infusion related issues reported by RN  ? ?Goal of Therapy:  ?Heparin level 0.3-0.7 units/mL ?Monitor platelets by anticoagulation protocol: Yes ?  ?Plan:  ?Continue heparin infusion at 1150 units/hr ?Recheck confirmatory heparin level in 6 hours  ?Daily CBC, heparin level  ?Monitor closely for s/sx of bleeding ?F/u for transition back to Apixaban as per GI recommendations  ? ? ?Lindell Spar, PharmD, BCPS ?Clinical Pharmacist ?03/07/2022 7:12 AM ? ?

## 2022-03-07 NOTE — Progress Notes (Signed)
?  Transition of Care (TOC) Screening Note ? ? ?Patient Details  ?Name: [name] ?Date of Birth: [birth year] ? ? ?Transition of Care [facility]) CM/SW Contact:    ?Dessa Phi, RN ?Phone Number: ?03/07/2022, 3:21 PM ? ? ? ?Transition of Care Department [facility]) has reviewed patient and no TOC needs have been identified at this time. We will continue to monitor patient advancement through interdisciplinary progression rounds. If new patient transition needs arise, please place a TOC consult. ?  ?

## 2022-03-07 NOTE — Discharge Summary (Signed)
?Physician Discharge Summary ?  ?Patient: [name] MRN: [id number] DOB: [birth year]  ?Admit date:     03/03/2022  ?Discharge date: 03/07/22  ?Discharge Physician: Oswald Hillock  ? ?PCP: Pcp, No  ? ?Recommendations at discharge:  ? ?Follow-up PCP in 1 week's ? ?Discharge Diagnoses: ?Principal Problem: ?  Food impaction of esophagus ?Active Problems: ?  Esophageal dysphagia ?  DVT of upper extremity (deep vein thrombosis) ([place]) ?  Malignant neoplasm of right upper lobe of lung ([place]) ?  Hypokalemia ?  Hyponatremia ?  Tobacco abuse ?  COPD (chronic obstructive pulmonary disease) ([place]) ?  Chronic anemia ?  Esophageal stenosis ? ?Resolved Problems: ?  * No resolved hospital problems. * ? ?Hospital Course: ? 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  GI was consulted.  Patient underwent EGD with food disimpaction on 03/03/2022.  GI recommended admission to hospital with repeat EGD with stenting on 03/06/2022. ? ?Assessment and Plan: ? ?Food impaction ?-Due to esophageal stenosis/radiation induced esophagitis ?-Status post EGD for food disimpaction on 03/03/2022. ?-Underwent  EGD  for  stenting yesterday ?-We will discharge on Protonix 40 mg p.o. twice daily, Carafate 4 times a day ?-Follow-up [facility] as outpatient ? ? ?Hypokalemia ?-Replete ?  ?Hypomagnesemia ?-Replete ?  ?History of acute DVT ?-Continue Eliquis ? ?Hyponatremia ?-Resolved ? ?Anemia chronic disease ?-Hemoglobin stable ? ?COPD ?-Respiratory status stable ?-Continue as needed albuterol ? ?History of lung cancer with radiation therapy ?-Follow-up oncology as outpatient ? ? ? ? ? ? ?  ? ? ?Consultants: Gastroenterology ?Procedures performed: EGD ?Disposition: Home ?Diet recommendation:  ?Discharge Diet Orders (From admission,  onward)  ? ?  Start     Ordered  ? 03/07/22 0000  Diet - low sodium heart healthy       ? 03/07/22 1423  ? ?  ?  ? ?  ? ?Regular diet ?DISCHARGE MEDICATION: ?Allergies as of 03/07/2022   ? ?   Reactions  ? Naproxen Nausea Only  ? Bactrim [sulfamethoxazole-trimethoprim] Nausea Only  ? ?  ? ?  ?Medication List  ?  ? ?TAKE these medications   ? ?diphenhydramine-acetaminophen 25-500 MG Tabs tablet ?Commonly known as: TYLENOL PM ?Take 3 tablets by mouth at bedtime as needed (for sleep). ?  ?Eliquis 5 MG Tabs tablet ?Generic drug: apixaban ?Take 1 tablet (5 mg total) by mouth 2 (two) times daily. ?  ?folic acid 1 MG tablet ?Commonly known as: FOLVITE ?Take 1 tablet (1 mg total) by mouth daily. ?  ?metoprolol tartrate 25 MG tablet ?Commonly known as: LOPRESSOR ?Take 1 tablet (25 mg total) by mouth 2 (two) times daily. ?  ?multivitamin Liqd ?Take 15 mLs by mouth daily. ?  ?nicotine 21 mg/24hr patch ?Commonly known as: NICODERM CQ - dosed in mg/24 hours ?Place 1 patch (21 mg total) onto the skin daily. ?  ?ondansetron 8 MG disintegrating tablet ?Commonly known as: ZOFRAN-ODT ?Dissolve 1 tablet (8 mg total) by mouth every 8 (eight) hours as needed for nausea or vomiting. ?  ?oxyCODONE 5 MG immediate release tablet ?Commonly known as: Oxy IR/ROXICODONE ?Take 1 tablet (5 mg total) by mouth every 6 (six) hours as needed for severe pain. ?  ?pantoprazole 40 MG tablet ?Commonly known as: PROTONIX ?Take 1 tablet (40 mg total) by mouth 2 (two) times daily. ?  ?  Spiriva HandiHaler 18 MCG inhalation capsule ?Generic drug: tiotropium ?Place 1 capsule (18 mcg total) into inhaler and inhale daily. ?  ?sucralfate 1 GM/10ML suspension ?Commonly known as: CARAFATE ?Take 10 mLs (1 g total) by mouth 4 (four) times daily -  with meals and at bedtime. ?What changed: when to take this ?  ?Ventolin HFA 108 (90 Base) MCG/ACT inhaler ?Generic drug: albuterol ?Inhale 2 puffs into the lungs every 6 (six) hours as needed for wheezing or shortness of  breath. ?  ? ?  ? ? ?Discharge Exam: ?Filed Weights  ? 03/05/22 0600 03/06/22 1051 03/07/22 0530  ?Weight: 64.3 kg 64 kg 64.1 kg  ? ?General-appears in no acute distress ?Heart-S1-S2, regular, no murmur auscultated ?Lungs-clear to auscultation bilaterally, no wheezing or crackles auscultated ?Abdomen-soft, nontender, no organomegaly ?Extremities-no edema in the lower extremities ?Neuro-alert, oriented x3, no focal deficit noted ? ?Condition at discharge: good ? ?The results of significant diagnostics from this hospitalization (including imaging, microbiology, ancillary and laboratory) are listed below for reference.  ? ?Imaging Studies: ?DG Neck Soft Tissue ? ?Result Date: 02/11/2022 ?CLINICAL DATA:  Possible launch food bolus. EXAM: NECK SOFT TISSUES - 1+ VIEW COMPARISON:  None. FINDINGS: There is no evidence of retropharyngeal soft tissue swelling or epiglottic enlargement. The cervical airway is unremarkable and no radio-opaque foreign body identified. IMPRESSION: Negative. Electronically Signed   By: Marijo Conception M.D.   On: 02/11/2022 17:03  ? ?DG C-Arm 1-60 Min ? ?Result Date: 03/06/2022 ?CLINICAL DATA:  Esophageal stent placement EXAM: DG C-ARM 1-60 MIN CONTRAST:  None FLUOROSCOPY: Fluoroscopy Time:  1 minute 24 second Radiation Exposure Index (if provided by the fluoroscopic device): 13.56 mGy Number of Acquired Spot Images: 15 COMPARISON:  CT chest 01/07/2022 FINDINGS: Multiple C-arm images of the chest were obtained. A endoscope is seen in the esophagus. There is deployment of a stent in the mid esophagus. The stent appears to open appropriately. IMPRESSION: Stenting mid esophagus. Electronically Signed   By: Franchot Gallo M.D.   On: 03/06/2022 13:34   ? ?Microbiology: ?Results for orders placed or performed during the hospital encounter of 02/11/22  ?Resp Panel by RT-PCR (Flu A&B, Covid) Nasopharyngeal Swab     Status: None  ? Collection Time: 02/11/22  3:16 PM  ? Specimen: Nasopharyngeal Swab;  Nasopharyngeal(NP) swabs in vial transport medium  ?Result Value Ref Range Status  ? SARS Coronavirus 2 by RT PCR NEGATIVE NEGATIVE Final  ?  Comment: (NOTE) ?SARS-CoV-2 target nucleic acids are NOT DETECTED. ? ?The SARS-CoV-2 RNA is generally detectable in upper respiratory ?specimens during the acute phase of infection. The lowest ?concentration of SARS-CoV-2 viral copies this assay can detect is ?138 copies/mL. A negative result does not preclude SARS-Cov-2 ?infection and should not be used as the sole basis for treatment or ?other patient management decisions. A negative result may occur with  ?improper specimen collection/handling, submission of specimen other ?than nasopharyngeal swab, presence of viral mutation(s) within the ?areas targeted by this assay, and inadequate number of viral ?copies(<138 copies/mL). A negative result must be combined with ?clinical observations, patient history, and epidemiological ?information. The expected result is Negative. ? ?Fact Sheet for Patients:  ?EntrepreneurPulse.com.au ? ?Fact Sheet for Healthcare Providers:  ?IncredibleEmployment.be ? ?This test is no t yet approved or cleared by the [place] FDA and  ?has been authorized for detection and/or diagnosis of SARS-CoV-2 by ?FDA under an Emergency Use Authorization (EUA). This EUA will remain  ?in effect (meaning this test can  be used) for the duration of the ?COVID-19 declaration under Section 564(b)(1) of the Act, 21 ?U.S.C.section 360bbb-3(b)(1), unless the authorization is terminated  ?or revoked sooner.  ? ? ?  ? Influenza A by PCR NEGATIVE NEGATIVE Final  ? Influenza B by PCR NEGATIVE NEGATIVE Final  ?  Comment: (NOTE) ?The Xpert Xpress SARS-CoV-2/FLU/RSV plus assay is intended as an aid ?in the diagnosis of influenza from Nasopharyngeal swab specimens and ?should not be used as a sole basis for treatment. Nasal washings and ?aspirates are unacceptable for Xpert Xpress  SARS-CoV-2/FLU/RSV ?testing. ? ?Fact Sheet for Patients: ?EntrepreneurPulse.com.au ? ?Fact Sheet for Healthcare Providers: ?IncredibleEmployment.be ? ?This test is not yet approved or cleared

## 2022-03-08 ENCOUNTER — Ambulatory Visit: Payer: Medicaid Other

## 2022-03-11 ENCOUNTER — Ambulatory Visit: Payer: Medicaid Other

## 2022-03-12 ENCOUNTER — Ambulatory Visit: Payer: Medicaid Other

## 2022-03-13 ENCOUNTER — Encounter (HOSPITAL_COMMUNITY)
Admission: EM | Disposition: A | Discharge status: Home / Self Care | Payer: Self-pay | Source: Home / Self Care | Attending: Internal Medicine

## 2022-03-13 ENCOUNTER — Observation Stay (HOSPITAL_COMMUNITY): Payer: Medicaid Other | Admitting: Certified Registered Nurse Anesthetist

## 2022-03-13 ENCOUNTER — Encounter (HOSPITAL_COMMUNITY): Payer: Self-pay | Admitting: Emergency Medicine

## 2022-03-13 ENCOUNTER — Other Ambulatory Visit: Payer: Self-pay

## 2022-03-13 ENCOUNTER — Ambulatory Visit: Payer: Medicaid Other

## 2022-03-13 ENCOUNTER — Inpatient Hospital Stay (HOSPITAL_COMMUNITY)
Admission: EM | Admit: 2022-03-13 | Discharge: 2022-03-17 | DRG: 920 | Disposition: A | Discharge status: Home / Self Care | Payer: Medicaid Other | Attending: Internal Medicine | Admitting: Internal Medicine

## 2022-03-13 ENCOUNTER — Emergency Department (HOSPITAL_COMMUNITY): Payer: Medicaid Other

## 2022-03-13 DIAGNOSIS — R1310 Dysphagia, unspecified: Secondary | ICD-10-CM

## 2022-03-13 DIAGNOSIS — I959 Hypotension, unspecified: Secondary | ICD-10-CM | POA: Diagnosis not present

## 2022-03-13 DIAGNOSIS — K222 Esophageal obstruction: Secondary | ICD-10-CM | POA: Diagnosis present

## 2022-03-13 DIAGNOSIS — D539 Nutritional anemia, unspecified: Secondary | ICD-10-CM | POA: Diagnosis present

## 2022-03-13 DIAGNOSIS — Z886 Allergy status to analgesic agent status: Secondary | ICD-10-CM

## 2022-03-13 DIAGNOSIS — Z882 Allergy status to sulfonamides status: Secondary | ICD-10-CM

## 2022-03-13 DIAGNOSIS — F418 Other specified anxiety disorders: Secondary | ICD-10-CM | POA: Diagnosis not present

## 2022-03-13 DIAGNOSIS — F17210 Nicotine dependence, cigarettes, uncomplicated: Secondary | ICD-10-CM | POA: Diagnosis present

## 2022-03-13 DIAGNOSIS — T85528A Displacement of other gastrointestinal prosthetic devices, implants and grafts, initial encounter: Principal | ICD-10-CM | POA: Diagnosis present

## 2022-03-13 DIAGNOSIS — T66XXXA Radiation sickness, unspecified, initial encounter: Secondary | ICD-10-CM | POA: Diagnosis present

## 2022-03-13 DIAGNOSIS — Y842 Radiological procedure and radiotherapy as the cause of abnormal reaction of the patient, or of later complication, without mention of misadventure at the time of the procedure: Secondary | ICD-10-CM | POA: Diagnosis present

## 2022-03-13 DIAGNOSIS — R112 Nausea with vomiting, unspecified: Principal | ICD-10-CM

## 2022-03-13 DIAGNOSIS — C3411 Malignant neoplasm of upper lobe, right bronchus or lung: Secondary | ICD-10-CM | POA: Diagnosis present

## 2022-03-13 DIAGNOSIS — Z888 Allergy status to other drugs, medicaments and biological substances status: Secondary | ICD-10-CM

## 2022-03-13 DIAGNOSIS — Z978 Presence of other specified devices: Secondary | ICD-10-CM

## 2022-03-13 DIAGNOSIS — J449 Chronic obstructive pulmonary disease, unspecified: Secondary | ICD-10-CM | POA: Diagnosis not present

## 2022-03-13 DIAGNOSIS — K208 Other esophagitis without bleeding: Secondary | ICD-10-CM | POA: Diagnosis present

## 2022-03-13 DIAGNOSIS — E162 Hypoglycemia, unspecified: Secondary | ICD-10-CM | POA: Diagnosis not present

## 2022-03-13 DIAGNOSIS — Y838 Other surgical procedures as the cause of abnormal reaction of the patient, or of later complication, without mention of misadventure at the time of the procedure: Secondary | ICD-10-CM | POA: Diagnosis present

## 2022-03-13 DIAGNOSIS — I82629 Acute embolism and thrombosis of deep veins of unspecified upper extremity: Secondary | ICD-10-CM | POA: Diagnosis present

## 2022-03-13 DIAGNOSIS — E876 Hypokalemia: Secondary | ICD-10-CM | POA: Diagnosis present

## 2022-03-13 DIAGNOSIS — Z79899 Other long term (current) drug therapy: Secondary | ICD-10-CM

## 2022-03-13 DIAGNOSIS — Z85118 Personal history of other malignant neoplasm of bronchus and lung: Secondary | ICD-10-CM

## 2022-03-13 DIAGNOSIS — Z7901 Long term (current) use of anticoagulants: Secondary | ICD-10-CM

## 2022-03-13 DIAGNOSIS — D7589 Other specified diseases of blood and blood-forming organs: Secondary | ICD-10-CM | POA: Diagnosis present

## 2022-03-13 HISTORY — PX: STENT REMOVAL: SHX6421

## 2022-03-13 HISTORY — PX: ESOPHAGOGASTRODUODENOSCOPY (EGD) WITH PROPOFOL: SHX5813

## 2022-03-13 LAB — CBG MONITORING, ED
Glucose-Capillary: 69 mg/dL — ABNORMAL LOW (ref 70–99)
Glucose-Capillary: 66 mg/dL — ABNORMAL LOW (ref 70–99)

## 2022-03-13 LAB — BASIC METABOLIC PANEL
Anion gap: 10 (ref 5–15)
BUN: 12 mg/dL (ref 6–20)
CO2: 21 mmol/L — ABNORMAL LOW (ref 22–32)
Calcium: 9.4 mg/dL (ref 8.9–10.3)
Chloride: 106 mmol/L (ref 98–111)
Creatinine, Ser: 1.06 mg/dL — ABNORMAL HIGH (ref 0.44–1.00)
GFR, Estimated: >60 mL/min (ref >60)
Glucose, Bld: 85 mg/dL (ref 70–99)
Potassium: 3.6 mmol/L (ref 3.5–5.1)
Sodium: 137 mmol/L (ref 135–145)

## 2022-03-13 LAB — CBC
HCT: 34.5 % — ABNORMAL LOW (ref 36.0–46.0)
Hemoglobin: 11.6 g/dL — ABNORMAL LOW (ref 12.0–15.0)
MCH: 35.8 pg — ABNORMAL HIGH (ref 26.0–34.0)
MCHC: 33.6 g/dL (ref 30.0–36.0)
MCV: 106.5 fL — ABNORMAL HIGH (ref 80.0–100.0)
Platelets: 210 10*3/uL (ref 150–400)
RBC: 3.24 MIL/uL — ABNORMAL LOW (ref 3.87–5.11)
RDW: 14.5 % (ref 11.5–15.5)
WBC: 8.4 10*3/uL (ref 4.0–10.5)
nRBC: 0 % (ref 0.0–0.2)

## 2022-03-13 LAB — GLUCOSE, CAPILLARY: Glucose-Capillary: 118 mg/dL — ABNORMAL HIGH (ref 70–99)

## 2022-03-13 LAB — TROPONIN I (HIGH SENSITIVITY)
Troponin I (High Sensitivity): 3 ng/L (ref <18)
Troponin I (High Sensitivity): 3 ng/L (ref <18)

## 2022-03-13 LAB — MAGNESIUM: Magnesium: 1.6 mg/dL — ABNORMAL LOW (ref 1.7–2.4)

## 2022-03-13 SURGERY — ESOPHAGOGASTRODUODENOSCOPY (EGD) WITH PROPOFOL
Anesthesia: Monitor Anesthesia Care

## 2022-03-13 MED ORDER — DEXTROSE 50 % IV SOLN
1.0000 | Freq: Once | INTRAVENOUS | Status: AC
  Administered 2022-03-13: 50 mL via INTRAVENOUS
  Filled 2022-03-13: qty 50

## 2022-03-13 MED ORDER — LACTATED RINGERS IV SOLN: INTRAVENOUS | Status: DC | PRN

## 2022-03-13 MED ORDER — ONDANSETRON HCL 4 MG/2ML IJ SOLN
4.0000 mg | Freq: Once | INTRAMUSCULAR | Status: AC
  Administered 2022-03-13: 4 mg via INTRAVENOUS
  Filled 2022-03-13: qty 2

## 2022-03-13 MED ORDER — PROPOFOL 500 MG/50ML IV EMUL
INTRAVENOUS | Status: DC | PRN
  Administered 2022-03-13: 125 ug/kg/min via INTRAVENOUS

## 2022-03-13 MED ORDER — ACETAMINOPHEN 650 MG RE SUPP: 650.0000 mg | Freq: Four times a day (QID) | RECTAL | Status: DC | PRN

## 2022-03-13 MED ORDER — SODIUM CHLORIDE 0.9 % IV SOLN: INTRAVENOUS | Status: DC

## 2022-03-13 MED ORDER — IPRATROPIUM-ALBUTEROL 0.5-2.5 (3) MG/3ML IN SOLN: 3.0000 mL | RESPIRATORY_TRACT | Status: DC | PRN

## 2022-03-13 MED ORDER — ONDANSETRON HCL 4 MG/2ML IJ SOLN: 4.0000 mg | Freq: Once | INTRAMUSCULAR | Status: DC

## 2022-03-13 MED ORDER — HYDROMORPHONE HCL 1 MG/ML IJ SOLN
1.0000 mg | Freq: Once | INTRAMUSCULAR | Status: AC
  Administered 2022-03-13: 1 mg via INTRAVENOUS
  Filled 2022-03-13: qty 1

## 2022-03-13 MED ORDER — KCL IN DEXTROSE-NACL 20-5-0.9 MEQ/L-%-% IV SOLN
INTRAVENOUS | Status: DC
Start: 2022-03-13 — End: 2022-03-16
  Filled 2022-03-13 (×4): qty 1000

## 2022-03-13 MED ORDER — LACTATED RINGERS IV BOLUS
1000.0000 mL | Freq: Once | INTRAVENOUS | Status: AC
  Administered 2022-03-13: 1000 mL via INTRAVENOUS

## 2022-03-13 MED ORDER — ONDANSETRON HCL 4 MG/2ML IJ SOLN
4.0000 mg | Freq: Four times a day (QID) | INTRAMUSCULAR | Status: DC | PRN
  Administered 2022-03-14 – 2022-03-15 (×5): 4 mg via INTRAVENOUS
  Filled 2022-03-13 (×5): qty 2

## 2022-03-13 MED ORDER — LACTATED RINGERS IV SOLN: INTRAVENOUS | Status: DC

## 2022-03-13 MED ORDER — LIDOCAINE 2% (20 MG/ML) 5 ML SYRINGE
INTRAMUSCULAR | Status: DC | PRN
  Administered 2022-03-13: 100 mg via INTRAVENOUS

## 2022-03-13 MED ORDER — FENTANYL CITRATE (PF) 100 MCG/2ML IJ SOLN
INTRAMUSCULAR | Status: DC | PRN
  Administered 2022-03-13: 50 ug via INTRAVENOUS

## 2022-03-13 MED ORDER — ONDANSETRON HCL 4 MG PO TABS
4.0000 mg | ORAL_TABLET | Freq: Four times a day (QID) | ORAL | Status: DC | PRN
  Administered 2022-03-16 – 2022-03-17 (×3): 4 mg via ORAL
  Filled 2022-03-13 (×4): qty 1

## 2022-03-13 MED ORDER — MAGNESIUM SULFATE 2 GM/50ML IV SOLN
2.0000 g | Freq: Once | INTRAVENOUS | Status: AC
  Administered 2022-03-13: 2 g via INTRAVENOUS
  Filled 2022-03-13: qty 50

## 2022-03-13 MED ORDER — FENTANYL CITRATE PF 50 MCG/ML IJ SOSY
50.0000 ug | PREFILLED_SYRINGE | Freq: Once | INTRAMUSCULAR | Status: AC
  Administered 2022-03-13: 50 ug via INTRAVENOUS
  Filled 2022-03-13: qty 1

## 2022-03-13 MED ORDER — DEXTROSE 50 % IV SOLN
25.0000 g | INTRAVENOUS | Status: DC | PRN
Start: 2022-03-13 — End: 2022-03-17

## 2022-03-13 MED ORDER — ACETAMINOPHEN 325 MG PO TABS
650.0000 mg | ORAL_TABLET | Freq: Four times a day (QID) | ORAL | Status: DC | PRN
  Administered 2022-03-16 – 2022-03-17 (×2): 650 mg via ORAL
  Filled 2022-03-13 (×2): qty 2

## 2022-03-13 MED ORDER — HYDROMORPHONE HCL 1 MG/ML IJ SOLN
1.0000 mg | INTRAMUSCULAR | Status: DC | PRN
  Administered 2022-03-13 – 2022-03-16 (×26): 1 mg via INTRAVENOUS
  Filled 2022-03-13 (×25): qty 1

## 2022-03-13 MED ORDER — PROPOFOL 10 MG/ML IV BOLUS
INTRAVENOUS | Status: DC | PRN
  Administered 2022-03-13: 100 mg via INTRAVENOUS

## 2022-03-13 MED ORDER — PANTOPRAZOLE SODIUM 40 MG IV SOLR
40.0000 mg | Freq: Two times a day (BID) | INTRAVENOUS | Status: DC
  Administered 2022-03-13 – 2022-03-16 (×7): 40 mg via INTRAVENOUS
  Filled 2022-03-13 (×8): qty 10

## 2022-03-13 SURGICAL SUPPLY — 15 items

## 2022-03-13 NOTE — ED Provider Notes (Signed)
?[place] [name] ?Provider Note ? ? ?CSN: [id number] ?Arrival date & time: 03/13/22  7253 ? ?  ? ?History ? ?Chief Complaint  ?Patient presents with  ? Weakness  ? Chest Pain  ? Arm Numbness  ? ? ?DELCENIA Booker is a 51 y.o. female.  Presents emergency department with concern for chest pain, nausea, vomiting.  Patient states that since she has been discharged she has been doing generally unwell, seems to be getting steadily worse.  Was initially tolerating liquids without difficulty however the last couple days has not been able to tolerate any liquids.  Reports that she is having excessive nausea and multiple episodes of vomiting daily.  Nonbloody nonbilious.  Is able to tolerate her own secretions without difficulty. ? ?Completed chart review, reviewed most recent discharge summary.  Patient has a known history of lung cancer, radiation esophagitis, esophageal stenosis requiring esophageal stent placement. ? ? 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  GI was consulted.  Patient underwent EGD with food disimpaction on 03/03/2022.  GI recommended admission to hospital with repeat EGD with stenting on 03/06/2022. ? ?HPI ? ?  ? ?Home Medications ?Prior to Admission medications   ?Medication Sig Start Date End Date Taking? Authorizing Provider  ?apixaban (ELIQUIS) 5 MG TABS tablet Take 1 tablet (5 mg total) by mouth 2 (two) times daily. 02/08/22  Yes Mercy Riding, MD  ?albuterol (VENTOLIN HFA) 108 (90 Base) MCG/ACT inhaler Inhale 2 puffs into the lungs every 6 (six) hours as needed for wheezing or shortness of breath. 02/08/22   Mercy Riding, MD  ?diphenhydramine-acetaminophen (TYLENOL PM) 25-500 MG TABS tablet Take 3 tablets by mouth at bedtime as needed (for  sleep).    [provider]  ?folic acid (FOLVITE) 1 MG tablet Take 1 tablet (1 mg total) by mouth daily. ?Patient not taking: Reported on 03/03/2022 02/09/22   Mercy Riding, MD  ?metoprolol tartrate (LOPRESSOR) 25 MG tablet Take 1 tablet (25 mg total) by mouth 2 (two) times daily. ?Patient not taking: Reported on 03/03/2022 02/08/22   Mercy Riding, MD  ?Multiple Vitamin (MULTIVITAMIN) LIQD Take 15 mLs by mouth daily. ?Patient not taking: Reported on 03/03/2022 02/08/22   Mercy Riding, MD  ?nicotine (NICODERM CQ - DOSED IN MG/24 HOURS) 21 mg/24hr patch Place 1 patch (21 mg total) onto the skin daily. 03/07/22   Oswald Hillock, MD  ?ondansetron (ZOFRAN-ODT) 8 MG disintegrating tablet Dissolve 1 tablet (8 mg total) by mouth every 8 (eight) hours as needed for nausea or vomiting. ?Patient not taking: Reported on 03/03/2022 02/08/22   Mercy Riding, MD  ?oxyCODONE (OXY IR/ROXICODONE) 5 MG immediate release tablet Take 1 tablet (5 mg total) by mouth every 6 (six) hours as needed for severe pain. 03/07/22   Oswald Hillock, MD  ?pantoprazole (PROTONIX) 40 MG tablet Take 1 tablet (40 mg total) by mouth 2 (two) times daily. 03/07/22 06/05/22  Oswald Hillock, MD  ?sucralfate (CARAFATE) 1 GM/10ML suspension Take 10 mLs (1 g total) by mouth 4 (four) times daily -  with meals and at bedtime. 03/07/22   Oswald Hillock, MD  ?tiotropium (SPIRIVA HANDIHALER) 18 MCG inhalation capsule Place 1 capsule (18 mcg total) into inhaler and inhale daily. ?Patient not taking: Reported on 03/03/2022 02/08/22 05/09/22  Mercy Riding, MD  ?   ? ?Allergies    ?Naproxen and Bactrim [sulfamethoxazole-trimethoprim]   ? ?Review of Systems   ?Review of Systems ? ?Physical Exam ?Updated Vital Signs ?BP 125/87   Pulse (!) 112   Temp 98 ?F (36.7 ?C) (Temporal)   Resp 15   Ht 5\' 8"  (1.727 m)   Wt 64 kg   LMP 04/24/2018   SpO2 98%   BMI 21.44 kg/m?  ?Physical Exam ? ?ED Results / Procedures / Treatments   ?Labs ?(all labs ordered are listed, but only abnormal  results are displayed) ?Labs Reviewed  ?BASIC METABOLIC PANEL - Abnormal; Notable for the following components:  ?    Result Value  ? CO2 21 (*)   ? Creatinine, Ser 1.06 (*)   ? All other components within normal limits  ?CBC - Abnormal; Notable for the following components:  ? RBC 3.24 (*)   ? Hemoglobin 11.6 (*)   ? HCT 34.5 (*)   ? MCV 106.5 (*)   ? MCH 35.8 (*)   ? All other components within normal limits  ?MAGNESIUM - Abnormal; Notable for the following components:  ? Magnesium 1.6 (*)   ? All other components within normal limits  ?CBG MONITORING, ED - Abnormal; Notable for the following components:  ? Glucose-Capillary 69 (*)   ? All other components within normal limits  ?CBG MONITORING, ED - Abnormal; Notable for the following components:  ? Glucose-Capillary 66 (*)   ? All other components within normal limits  ?URINALYSIS, ROUTINE W REFLEX MICROSCOPIC  ?TROPONIN I (HIGH SENSITIVITY)  ?TROPONIN I (HIGH SENSITIVITY)  ? ? ?EKG ?None ? ?Radiology ?DG ESOPHAGUS W SINGLE CM (SOL OR THIN BA) ? ?Result Date: 03/13/2022 ?CLINICAL DATA:  51 year old with history of lung cancer status post radiation therapy with known radiation induced esophagitis and esophageal stricture. History of food impaction, status post removal of impaction and esophageal stent placement on 03/06/2022, who presented to emergency department today due to chest pain. Request for single-contrast esophagram for further evaluation. EXAM: ESOPHAGUS/BARIUM SWALLOW/TABLET STUDY TECHNIQUE: Single contrast examination was performed using water-soluble contrast. This exam was performed by Durenda Guthrie, PA-C, and was supervised and interpreted by Lorin Picket, MD. FLUOROSCOPY: Radiation Exposure Index (as provided by the fluoroscopic device): 3.20 mGy Kerma COMPARISON:  C-arm images during EGD on 03/06/2022, esophagram on 12/24/2021. FINDINGS: [facility] AP chest appears normal. Esophageal stent visualized at distal esophagus extending into the stomach.  Swallowing: Not examined. Pharynx: Not examined. Esophagus: Moderate narrowing of the esophagus at the junction of the upper and middle thirds, as on recent esophagram. As noted above, the wall stent has migrated distally, flanking the gastroesophageal junction. Esophageal motility: Not examined. Hiatal Hernia: None. Gastroesophageal reflux: Not examined. Ingested 65mm barium tablet: Not given. Other: No contrast extravasation into pleural/mediastinal space noted. IMPRESSION: 1. Esophageal stent migration, currently at distal esophagus extending into the stomach. 2. Short segment moderate esophageal stricture at the junction of the upper and mid esophagus. 3.  No leak. Electronically Signed   By: Lorin Picket M.D.   On: 03/13/2022 13:09   ? ?Procedures ?Procedures  ? ? ?Medications Ordered in ED ?Medications  ?0.9 %  sodium chloride infusion (has no administration in time range)  ?pantoprazole (PROTONIX) injection 40 mg ( Intravenous Automatically Held 03/21/22 2200)  ?dextrose 5 % and 0.9 % NaCl with KCl 20 mEq/L infusion (has no administration in time range)  ?HYDROmorphone (DILAUDID) injection 1 mg ( Intravenous MAR Hold  03/13/22 1530)  ?ondansetron (ZOFRAN) injection 4 mg ( Intravenous MAR Hold 03/13/22 1530)  ?HYDROmorphone (DILAUDID) injection 1 mg ( Intravenous MAR Hold 03/13/22 1530)  ?acetaminophen (TYLENOL) tablet 650 mg ( Oral MAR Hold 03/13/22 1530)  ?  Or  ?acetaminophen (TYLENOL) suppository 650 mg ( Rectal MAR Hold 03/13/22 1530)  ?ondansetron (ZOFRAN) tablet 4 mg ( Oral MAR Hold 03/13/22 1530)  ?  Or  ?ondansetron [facility]) injection 4 mg ( Intravenous MAR Hold 03/13/22 1530)  ?magnesium sulfate IVPB 2 g 50 mL ( Intravenous MAR Hold 03/13/22 1530)  ?dextrose 50 % solution 25 g ( Intravenous MAR Hold 03/13/22 1530)  ?ipratropium-albuterol (DUONEB) 0.5-2.5 (3) MG/3ML nebulizer solution 3 mL ( Nebulization MAR Hold 03/13/22 1530)  ?fentaNYL (SUBLIMAZE) injection 50 mcg (50 mcg Intravenous Given 03/13/22 1027)   ?ondansetron (ZOFRAN) injection 4 mg (4 mg Intravenous Given 03/13/22 1027)  ?lactated ringers bolus 1,000 mL (0 mLs Intravenous Stopped 03/13/22 1145)  ?HYDROmorphone (DILAUDID) injection 1 mg (1 mg Intravenous Given 4/1

## 2022-03-13 NOTE — Plan of Care (Signed)
?  Problem: Education: ?Goal: Knowledge of General Education information will improve ?Description: Including pain rating scale, medication(s)/side effects and non-pharmacologic comfort measures ?Outcome: Progressing ?  ?Problem: Nutrition: ?Goal: Adequate nutrition will be maintained ?Outcome: Not Progressing ?  ?Problem: Pain Managment: ?Goal: General experience of comfort will improve ?Outcome: Not Progressing ?  ?

## 2022-03-13 NOTE — ED Notes (Signed)
Patient transported to radiology

## 2022-03-13 NOTE — Anesthesia Preprocedure Evaluation (Signed)
Anesthesia Evaluation  ?Patient identified by MRN, date of birth, ID band ?Patient awake ? ? ? ?Reviewed: ?Allergy & Precautions, NPO status , Patient's Chart, lab work & pertinent test results ? ?Airway ?Mallampati: II ? ?TM Distance: >3 FB ?Neck ROM: Full ? ? ? Dental ? ?(+) Partial Upper ?  ?Pulmonary ?asthma , COPD, Current Smoker and Patient abstained from smoking.,  ?Lung Ca ?  ?Pulmonary exam normal ? ? ? ? ? ? ? Cardiovascular ?negative cardio ROS ? ? ?Rhythm:Regular Rate:Normal ? ? ?  ?Neuro/Psych ?Anxiety Depression negative neurological ROS ?   ? GI/Hepatic ?Neg liver ROS, Esophageal stricture, dysphagia  ?  ?Endo/Other  ?negative endocrine ROS ? Renal/GU ?  ?negative genitourinary ?  ?Musculoskeletal ? ?(+) Arthritis , Osteoarthritis,   ? Abdominal ?Normal abdominal exam  (+)   ?Peds ? Hematology ? ?(+) Blood dyscrasia, anemia ,   ?Anesthesia Other Findings ? ? Reproductive/Obstetrics ? ?  ? ? ? ? ? ? ? ? ? ? ? ? ? ?  ?  ? ? ? ? ? ? ? ? ?Anesthesia Physical ?Anesthesia Plan ? ?ASA: 3 ? ?Anesthesia Plan: MAC  ? ?Post-op Pain Management:   ? ?Induction: Intravenous ? ?PONV Risk Score and Plan: 1 ? ?Airway Management Planned: Simple Face Mask, Natural Airway and Nasal Cannula ? ?Additional Equipment: None ? ?Intra-op Plan:  ? ?Post-operative Plan:  ? ?Informed Consent: I have reviewed the patients History and Physical, chart, labs and discussed the procedure including the risks, benefits and alternatives for the proposed anesthesia with the patient or authorized representative who has indicated his/her understanding and acceptance.  ? ? ? ?Dental advisory given ? ?Plan Discussed with: CRNA ? ?Anesthesia Plan Comments:   ? ? ? ? ? ? ?Anesthesia Quick Evaluation ? ?

## 2022-03-13 NOTE — H&P (Signed)
?History and Physical  ? ? ?Patient: [name] FIE:[id number] DOB: 08-18-1971 ?DOA: 03/13/2022 ?DOS: the patient was seen and examined on 03/13/2022 ?PCP: [name], PA-C  ?Patient coming from: Home ? ?Chief Complaint:  ?Chief Complaint  ?Patient presents with  ? Weakness  ? Chest Pain  ? Arm Numbness  ? ?HPI: [name] is a 51 y.o. female with medical history significant of anxiety, depression, asthma/COPD, history of pyelonephritis, lung cancer in the right upper lobe with a history of radiation therapy complicated by esophageal radiation esophagitis and development of stricture with dysphagia who was discharged last week after being admitted with dysphagia, had with an esophageal stent placed due to developing esophageal food impaction and returns today with worsening precordial chest pain after tolerating liquids with no significant discomfort until 2 days ago.  She has felt weak, nauseous, chest pain with subjective left arm/left lower extremity numbness for 2 days.  She has also been wheezing.  She has been using her albuterol inhaler at home, but she denied fever, rhinorrhea, sore throat or worsening chronic cough.  No flank pain, oliguria, dysuria, frequency hematuria.  No PND, orthopnea or pitting edema of the lower extremities. ? ?ED course: Initial vital signs were temperature 98 ?F, pulse 132, respiration 19, BP 102/78 mmHg O2 sat 97% on room air.  The patient received hydromorphone 1 mg IVP, ondansetron 4 mg IVP, fentanyl 50 mcg IVP x2, dextrose 50% 50 mL x 1 and 1000 mL of LR bolus.  I added hydromorphone 1 mg IVP, ondansetron 4 mg IVP, magnesium sulfate 2 g IVPB and started the patient on NS plus D5 percent, plus KCl infusion. ? ?Lab work: CBC is her white count 8.4, hemoglobin 11.6 g/dL with an MCV of 106.5 fL and platelets 210.  Troponin was 3 ng/L.  BMP with a CO2 of 21 mmol/L and creatinine 1.06 mg/dL.  The rest of the BMP measurements were unremarkable.  Magnesium was 1.6 mg/dL.   CBG has been 69 and then 66 mg/dL. ? ?Imaging: Showed esophageal stent migration, currently at the distal esophagus extending into the stomach.  There is a short segment moderate esophageal stricture at the junction of the upper and mid esophagus.  There was no leak.  Please see images and full radiology report for further details. ? ?Review of Systems: As mentioned in the history of present illness. All other systems reviewed and are negative. ?Past Medical History:  ?Diagnosis Date  ? Ankle sprain   ? left  ? Anxiety   ? treated for panic attacks in the past.  ? Asthma   ? COPD (chronic obstructive pulmonary disease) ([place])   ? Depression   ? History of radiation therapy   ? Right lung- 09/26/21-11/13/21- Dr. Gery Pray  ? Irregular heart rate   ? Lung cancer ([place])   ? Renal disorder   ? kidney infections  ? ?Past Surgical History:  ?Procedure Laterality Date  ? APPENDECTOMY    ? BRONCHIAL BIOPSY  09/21/2021  ? Procedure: BRONCHIAL BIOPSIES;  Surgeon: Collene Gobble, MD;  Location: [facility] ENDOSCOPY;  Service: Pulmonary;;  ? BRONCHIAL BRUSHINGS  09/21/2021  ? Procedure: BRONCHIAL BRUSHINGS;  Surgeon: Collene Gobble, MD;  Location: [facility] ENDOSCOPY;  Service: Pulmonary;;  ? BRONCHIAL NEEDLE ASPIRATION BIOPSY  09/21/2021  ? Procedure: BRONCHIAL NEEDLE ASPIRATION BIOPSIES;  Surgeon: Collene Gobble, MD;  Location: [facility] ENDOSCOPY;  Service: Pulmonary;;  ? ESOPHAGEAL STENT PLACEMENT N/A 03/06/2022  ? Procedure: ESOPHAGEAL STENT PLACEMENT;  Surgeon: Clarene Essex, MD;  Location: [facility] ENDOSCOPY;  Service: Gastroenterology;  Laterality: N/A;  ? ESOPHAGOGASTRODUODENOSCOPY N/A 12/26/2021  ? Procedure: ESOPHAGOGASTRODUODENOSCOPY (EGD);  Surgeon: Clarene Essex, MD;  Location: [facility] ENDOSCOPY;  Service: Endoscopy;  Laterality: N/A;  ? ESOPHAGOGASTRODUODENOSCOPY N/A 02/11/2022  ? Procedure: ESOPHAGOGASTRODUODENOSCOPY (EGD);  Surgeon: Arta Silence, MD;  Location: [facility] ENDOSCOPY;  Service: Gastroenterology;  Laterality: N/A;  ?  ESOPHAGOGASTRODUODENOSCOPY N/A 03/03/2022  ? Procedure: ESOPHAGOGASTRODUODENOSCOPY (EGD);  Surgeon: Wilford Corner, MD;  Location: [facility] ENDOSCOPY;  Service: Gastroenterology;  Laterality: N/A;  ? ESOPHAGOGASTRODUODENOSCOPY N/A 03/06/2022  ? Procedure: ESOPHAGOGASTRODUODENOSCOPY (EGD);  Surgeon: Clarene Essex, MD;  Location: [facility] ENDOSCOPY;  Service: Gastroenterology;  Laterality: N/A;  With esophageal stent placement  ? ESOPHAGOGASTRODUODENOSCOPY (EGD) WITH PROPOFOL N/A 01/07/2022  ? Procedure: ESOPHAGOGASTRODUODENOSCOPY (EGD) WITH PROPOFOL;  Surgeon: Otis Brace, MD;  Location: [facility] ENDOSCOPY;  Service: Gastroenterology;  Laterality: N/A;  ? ESOPHAGOGASTRODUODENOSCOPY (EGD) WITH PROPOFOL N/A 02/02/2022  ? Procedure: ESOPHAGOGASTRODUODENOSCOPY (EGD) WITH PROPOFOL;  Surgeon: Ronnette Juniper, MD;  Location: [facility] ENDOSCOPY;  Service: Gastroenterology;  Laterality: N/A;  ? ESOPHAGOGASTRODUODENOSCOPY (EGD) WITH PROPOFOL N/A 02/04/2022  ? Procedure: ESOPHAGOGASTRODUODENOSCOPY (EGD) WITH PROPOFOL;  Surgeon: Otis Brace, MD;  Location: [facility] ENDOSCOPY;  Service: Gastroenterology;  Laterality: N/A;  ? FOREIGN BODY REMOVAL  12/26/2021  ? Procedure: FOREIGN BODY REMOVAL;  Surgeon: Clarene Essex, MD;  Location: [facility] ENDOSCOPY;  Service: Endoscopy;;  ? FOREIGN BODY REMOVAL  02/02/2022  ? Procedure: FOREIGN BODY REMOVAL;  Surgeon: Ronnette Juniper, MD;  Location: [facility] ENDOSCOPY;  Service: Gastroenterology;;  ? FOREIGN BODY REMOVAL  02/11/2022  ? Procedure: FOREIGN BODY REMOVAL;  Surgeon: Arta Silence, MD;  Location: [facility] ENDOSCOPY;  Service: Gastroenterology;;  ? FOREIGN BODY REMOVAL N/A 03/03/2022  ? Procedure: FOREIGN BODY REMOVAL;  Surgeon: Wilford Corner, MD;  Location: [facility] ENDOSCOPY;  Service: Gastroenterology;  Laterality: N/A;  ? IR CHEST FLUORO  12/27/2021  ? LUMBAR LAMINECTOMY N/A 05/13/2018  ? Procedure: LEFT LUMBAR FOUR-FIVE MICRODISCECTOMY;  Surgeon: Marybelle Killings, MD;  Location: [place];  Service: Orthopedics;  Laterality: N/A;  ? TUBAL  LIGATION    ? TUBAL LIGATION  1998  ? VIDEO BRONCHOSCOPY WITH ENDOBRONCHIAL ULTRASOUND N/A 09/21/2021  ? Procedure: VIDEO BRONCHOSCOPY WITH ENDOBRONCHIAL ULTRASOUND;  Surgeon: Collene Gobble, MD;  Location: [facility] ENDOSCOPY;  Service: Pulmonary;  Laterality: N/A;  ? ?Social History:  reports that she has been smoking cigarettes. She has a 15.00 pack-year smoking history. She has never used smokeless tobacco. She reports that she does not currently use alcohol. She reports that she does not currently use drugs after having used the following drugs: Marijuana. ? ?Allergies  ?Allergen Reactions  ? Naproxen Nausea Only  ? Bactrim [Sulfamethoxazole-Trimethoprim] Nausea Only  ? ? ?Family History  ?Problem Relation Age of Onset  ? Hypertension Mother   ? Hypertension Father   ? Heart disease Father   ? ? ?Prior to Admission medications   ?Medication Sig Start Date End Date Taking? Authorizing Provider  ?apixaban (ELIQUIS) 5 MG TABS tablet Take 1 tablet (5 mg total) by mouth 2 (two) times daily. 02/08/22  Yes Mercy Riding, MD  ?albuterol (VENTOLIN HFA) 108 (90 Base) MCG/ACT inhaler Inhale 2 puffs into the lungs every 6 (six) hours as needed for wheezing or shortness of breath. 02/08/22   Mercy Riding, MD  ?diphenhydramine-acetaminophen (TYLENOL PM) 25-500 MG TABS tablet Take 3 tablets by mouth at bedtime as needed (for sleep).    [provider]  ?folic acid (FOLVITE) 1 MG tablet Take 1  tablet (1 mg total) by mouth daily. ?Patient not taking: Reported on 03/03/2022 02/09/22   Mercy Riding, MD  ?metoprolol tartrate (LOPRESSOR) 25 MG tablet Take 1 tablet (25 mg total) by mouth 2 (two) times daily. ?Patient not taking: Reported on 03/03/2022 02/08/22   Mercy Riding, MD  ?Multiple Vitamin (MULTIVITAMIN) LIQD Take 15 mLs by mouth daily. ?Patient not taking: Reported on 03/03/2022 02/08/22   Mercy Riding, MD  ?nicotine (NICODERM CQ - DOSED IN MG/24 HOURS) 21 mg/24hr patch Place 1 patch (21 mg total) onto the skin daily. 03/07/22    Oswald Hillock, MD  ?ondansetron (ZOFRAN-ODT) 8 MG disintegrating tablet Dissolve 1 tablet (8 mg total) by mouth every 8 (eight) hours as needed for nausea or vomiting. ?Patient not taking: Reported on 03/03/2022 02/08/22   Cyndia Skeeters

## 2022-03-13 NOTE — ED Notes (Signed)
Patient transported to Endo. Bed has been assigned to patient. Patient will be transported to assigned room after procedure. ?

## 2022-03-13 NOTE — Op Note (Signed)
[facility] ?Patient Name: [name] ?Procedure Date: 03/13/2022 ?MRN: [id number] ?Attending MD: Clarene Essex , MD ?Date of Birth: Mar 17, 1971 ?CSN: [id number] ?Age: 51 ?Admit Type: Inpatient ?Procedure:                Upper GI endoscopy ?Indications:              Abnormal UGI series stent migration into the distal  ?                          esophagus smooth stricture present proximally ?Providers:                Clarene Essex, MD, Burtis Junes, RN, Frazier Richards,  ?                          Technician ?Referring MD:              ?Medicines:                Propofol total dose 205 mg IV, 100 mg IV lidocaine ?Complications:            No immediate complications. Estimated blood loss:  ?                          Minimal. ?Estimated Blood Loss:     Estimated blood loss was minimal. ?Procedure:                Pre-Anesthesia Assessment: ?                          - Prior to the procedure, a History and Physical  ?                          was performed, and patient medications and  ?                          allergies were reviewed. The patient's tolerance of  ?                          previous anesthesia was also reviewed. The risks  ?                          and benefits of the procedure and the sedation  ?                          options and risks were discussed with the patient.  ?                          All questions were answered, and informed consent  ?                          was obtained. Prior Anticoagulants: The patient has  ?                          taken Eliquis (apixaban), last dose was 1 day prior  ?  to procedure. ASA Grade Assessment: III - A patient  ?                          with severe systemic disease. After reviewing the  ?                          risks and benefits, the patient was deemed in  ?                          satisfactory condition to undergo the procedure. ?                          After obtaining informed consent, the endoscope was  ?                           passed under direct vision. Throughout the  ?                          procedure, the patient's blood pressure, pulse, and  ?                          oxygen saturations were monitored continuously. The  ?                          GIF-H190 ([id number]) Olympus endoscope was introduced  ?                          through the mouth, and advanced to the duodenal  ?                          bulb. The upper GI endoscopy was accomplished  ?                          without difficulty. The patient tolerated the  ?                          procedure well. ?Scope In: ?Scope Out: ?Findings: ?     The larynx was normal. ?     One benign-appearing, intrinsic moderate stenosis was found 25 cm from  ?     the incisors. The stenosis was traversed with minimal trauma using the  ?     regular scope. ?     An esophageal stent was found in the lower third of the esophagus. Stent  ?     removal was accomplished with a Raptor grasping device. We initially  ?     tried the hot biopsy forceps which might be easier to release the stent  ?     above the stricture however it would not grasp the stent well enough to  ?     remove it so we switched to the Raptor grasper which worked fine in the  ?     customary fashion ?     Retained fluid was found in the stomach. Fluid aspiration was performed.  ?     No gross stomach abnormalities were seen ?     The duodenal bulb was  normal. ?     The exam was otherwise without abnormality. ?Impression:               - Normal larynx. ?                          - Benign-appearing esophageal stenosis. ?                          - Pre-existing esophageal stent which had migrated  ?                          distally, it was removed. ?                          - Retained gastric fluid. Fluid aspiration  ?                          performed. ?                          - Normal duodenal bulb. ?                          - The examination was otherwise normal. ?Moderate Sedation: ?     Not Applicable -  Patient had care per Anesthesia. ?Recommendation:           - Patient has a contact number available for  ?                          emergencies. The signs and symptoms of potential  ?                          delayed complications were discussed with the  ?                          patient. Return to normal activities tomorrow.  ?                          Written discharge instructions were provided to the  ?                          patient. ?                          - Clear liquid diet today. Can go home if no  ?                          problems with clear liquids and very slowly advance  ?                          diet tomorrow if doing well again taking small  ?                          bites chewing her food well drinking plenty of  ?  liquids staying upright eating slowly etc. ?                          - Resume Eliquis (apixaban) at prior dose in 2 days  ?                          if okay otherwise okay to continue other medicine. ?                          - Return to GI clinic PRN. She is supposed to find  ?                          a different GI group that is covered by her  ?                          insurance ?                          - Telephone GI clinic if symptomatic PRN. Consider  ?                          outpatient dilation versus repeat stenting using  ?                          larger stent particularly if patient continues to  ?                          have food impaction ?Procedure Code(s):        --- Professional --- ?                          [phone], Esophagogastroduodenoscopy, flexible,  ?                          transoral; with removal of foreign body(s) ?Diagnosis Code(s):        --- Professional --- ?                          K22.2, Esophageal obstruction ?                          Z97.8, Presence of other specified devices ?                          R93.3, Abnormal findings on diagnostic imaging of  ?                          other parts of digestive  tract ?CPT copyright 2019 [employer]. All rights reserved. ?The codes documented in this report are preliminary and upon coder review may  ?be revised to meet current compliance requirements. ?Clarene Essex, MD ?03/13/2022 4:03:42 PM ?This report has been signed electronically. ?Number of Addenda: 0 ?

## 2022-03-13 NOTE — Procedures (Addendum)
Water soluble contrast esophogram reveled NO leak into pleural /mediastinal space.  ? ?The esophageal stent migration noted, currently located at distal esophagus extending into stomach.  ? ?Call report given to Dr. Roslynn Amble at 1300hrs.  ? ? ?Tera Mater PA-C ?03/13/2022 1:06 PM ? ? ? ?

## 2022-03-13 NOTE — Transfer of Care (Signed)
Immediate Anesthesia Transfer of Care Note ? ?Patient: [name] ? ?Procedure(s) Performed: ESOPHAGOGASTRODUODENOSCOPY (EGD) WITH PROPOFOL ?STENT REMOVAL ? ?Patient Location: Endoscopy Unit ? ?Anesthesia Type:MAC ? ?Level of Consciousness: drowsy and patient cooperative ? ?Airway & Oxygen Therapy: Patient Spontanous Breathing and Patient connected to face mask oxygen ? ?Post-op Assessment: Report given to RN and Post -op Vital signs reviewed and stable ? ?Post vital signs: Reviewed and stable ? ?Last Vitals:  ?Vitals Value Taken Time  ?BP    ?Temp    ?Pulse 103 03/13/22 1559  ?Resp 26 03/13/22 1559  ?SpO2 100 % 03/13/22 1559  ?Vitals shown include unvalidated device data. ? ?Last Pain:  ?Vitals:  ? 03/13/22 1506  ?TempSrc: Temporal  ?PainSc: 10-Worst pain ever  ?   ? ?  ? ?Complications: No notable events documented. ?

## 2022-03-13 NOTE — ED Triage Notes (Signed)
Pt reports weakness, chest pain, and left arm and left leg numbness x2days. No weakness or deficits noted on one side. Stent placed in esophagus last week.  ?Hx lung cancer  ?

## 2022-03-13 NOTE — Consult Note (Addendum)
Referring Provider: [facility] ?Primary Care Physician:  [name], PA-C ?Primary Gastroenterologist:  Dr. Watt Climes ? ?Reason for Consultation: Duodenal stent complication ? ?HPI: [name] is a 51 y.o. female history of radiation induced stricture and previous food impactions presents for evaluation of migration of duodenal stent. ? ?EGD 02/11/2022: Food noted in the mid esophagus above a very tight mid esophageal stenosis that could not be traversed with the standard endoscope.  Passage of the endoscope during EGD 02/04/2022 caused mucosal disruption and dilation was not done. ? ?Recently admitted again for food impaction 4/9 ?EGD 03/03/22: Food in mid esophagus, removal successful, benign-appearing esophageal stenosis, not amenable to dilation, severe esophagitis with bleeding ? ?Repeat endoscopy 03/06/2022: Normal larynx, benign-appearing esophageal stenosis, prosthesis placed.  Normal stomach, normal duodenum, no specimens collected. ? ?Patient states she has had persistent chest pain that feels like heartburn ongoing since most recent EGD 4/12. She states she has been able to swallow food without difficultly. Overall, only had one episode of dysphagia since last visit. Chest pain is constant. Barium swallow showed stent migration so GI was consulted for repeat EGD. ? ? ?Past Medical History:  ?Diagnosis Date  ? Ankle sprain   ? left  ? Anxiety   ? treated for panic attacks in the past.  ? Asthma   ? COPD (chronic obstructive pulmonary disease) ([place])   ? Depression   ? History of radiation therapy   ? Right lung- 09/26/21-11/13/21- Dr. Gery Pray  ? Irregular heart rate   ? Lung cancer ([place])   ? Renal disorder   ? kidney infections  ? ? ?Past Surgical History:  ?Procedure Laterality Date  ? APPENDECTOMY    ? BRONCHIAL BIOPSY  09/21/2021  ? Procedure: BRONCHIAL BIOPSIES;  Surgeon: Collene Gobble, MD;  Location: [facility] ENDOSCOPY;  Service: Pulmonary;;  ? BRONCHIAL BRUSHINGS  09/21/2021  ? Procedure: BRONCHIAL  BRUSHINGS;  Surgeon: Collene Gobble, MD;  Location: [facility] ENDOSCOPY;  Service: Pulmonary;;  ? BRONCHIAL NEEDLE ASPIRATION BIOPSY  09/21/2021  ? Procedure: BRONCHIAL NEEDLE ASPIRATION BIOPSIES;  Surgeon: Collene Gobble, MD;  Location: [facility] ENDOSCOPY;  Service: Pulmonary;;  ? ESOPHAGEAL STENT PLACEMENT N/A 03/06/2022  ? Procedure: ESOPHAGEAL STENT PLACEMENT;  Surgeon: Clarene Essex, MD;  Location: [facility] ENDOSCOPY;  Service: Gastroenterology;  Laterality: N/A;  ? ESOPHAGOGASTRODUODENOSCOPY N/A 12/26/2021  ? Procedure: ESOPHAGOGASTRODUODENOSCOPY (EGD);  Surgeon: Clarene Essex, MD;  Location: [facility] ENDOSCOPY;  Service: Endoscopy;  Laterality: N/A;  ? ESOPHAGOGASTRODUODENOSCOPY N/A 02/11/2022  ? Procedure: ESOPHAGOGASTRODUODENOSCOPY (EGD);  Surgeon: Arta Silence, MD;  Location: [facility] ENDOSCOPY;  Service: Gastroenterology;  Laterality: N/A;  ? ESOPHAGOGASTRODUODENOSCOPY N/A 03/03/2022  ? Procedure: ESOPHAGOGASTRODUODENOSCOPY (EGD);  Surgeon: Wilford Corner, MD;  Location: [facility] ENDOSCOPY;  Service: Gastroenterology;  Laterality: N/A;  ? ESOPHAGOGASTRODUODENOSCOPY N/A 03/06/2022  ? Procedure: ESOPHAGOGASTRODUODENOSCOPY (EGD);  Surgeon: Clarene Essex, MD;  Location: [facility] ENDOSCOPY;  Service: Gastroenterology;  Laterality: N/A;  With esophageal stent placement  ? ESOPHAGOGASTRODUODENOSCOPY (EGD) WITH PROPOFOL N/A 01/07/2022  ? Procedure: ESOPHAGOGASTRODUODENOSCOPY (EGD) WITH PROPOFOL;  Surgeon: Otis Brace, MD;  Location: [facility] ENDOSCOPY;  Service: Gastroenterology;  Laterality: N/A;  ? ESOPHAGOGASTRODUODENOSCOPY (EGD) WITH PROPOFOL N/A 02/02/2022  ? Procedure: ESOPHAGOGASTRODUODENOSCOPY (EGD) WITH PROPOFOL;  Surgeon: Ronnette Juniper, MD;  Location: [facility] ENDOSCOPY;  Service: Gastroenterology;  Laterality: N/A;  ? ESOPHAGOGASTRODUODENOSCOPY (EGD) WITH PROPOFOL N/A 02/04/2022  ? Procedure: ESOPHAGOGASTRODUODENOSCOPY (EGD) WITH PROPOFOL;  Surgeon: Otis Brace, MD;  Location: [facility] ENDOSCOPY;  Service: Gastroenterology;  Laterality: N/A;  ? FOREIGN BODY  REMOVAL  12/26/2021  ?  Procedure: FOREIGN BODY REMOVAL;  Surgeon: Clarene Essex, MD;  Location: [facility] ENDOSCOPY;  Service: Endoscopy;;  ? FOREIGN BODY REMOVAL  02/02/2022  ? Procedure: FOREIGN BODY REMOVAL;  Surgeon: Ronnette Juniper, MD;  Location: [facility] ENDOSCOPY;  Service: Gastroenterology;;  ? FOREIGN BODY REMOVAL  02/11/2022  ? Procedure: FOREIGN BODY REMOVAL;  Surgeon: Arta Silence, MD;  Location: [facility] ENDOSCOPY;  Service: Gastroenterology;;  ? FOREIGN BODY REMOVAL N/A 03/03/2022  ? Procedure: FOREIGN BODY REMOVAL;  Surgeon: Wilford Corner, MD;  Location: [facility] ENDOSCOPY;  Service: Gastroenterology;  Laterality: N/A;  ? IR CHEST FLUORO  12/27/2021  ? LUMBAR LAMINECTOMY N/A 05/13/2018  ? Procedure: LEFT LUMBAR FOUR-FIVE MICRODISCECTOMY;  Surgeon: Marybelle Killings, MD;  Location: [place];  Service: Orthopedics;  Laterality: N/A;  ? TUBAL LIGATION    ? TUBAL LIGATION  1998  ? VIDEO BRONCHOSCOPY WITH ENDOBRONCHIAL ULTRASOUND N/A 09/21/2021  ? Procedure: VIDEO BRONCHOSCOPY WITH ENDOBRONCHIAL ULTRASOUND;  Surgeon: Collene Gobble, MD;  Location: [facility] ENDOSCOPY;  Service: Pulmonary;  Laterality: N/A;  ? ? ?Prior to Admission medications   ?Medication Sig Start Date End Date Taking? Authorizing Provider  ?albuterol (VENTOLIN HFA) 108 (90 Base) MCG/ACT inhaler Inhale 2 puffs into the lungs every 6 (six) hours as needed for wheezing or shortness of breath. 02/08/22   Mercy Riding, MD  ?apixaban (ELIQUIS) 5 MG TABS tablet Take 1 tablet (5 mg total) by mouth 2 (two) times daily. 02/08/22   Mercy Riding, MD  ?diphenhydramine-acetaminophen (TYLENOL PM) 25-500 MG TABS tablet Take 3 tablets by mouth at bedtime as needed (for sleep).    [provider]  ?folic acid (FOLVITE) 1 MG tablet Take 1 tablet (1 mg total) by mouth daily. ?Patient not taking: Reported on 03/03/2022 02/09/22   Mercy Riding, MD  ?metoprolol tartrate (LOPRESSOR) 25 MG tablet Take 1 tablet (25 mg total) by mouth 2 (two) times daily. ?Patient not taking: Reported on 03/03/2022  02/08/22   Mercy Riding, MD  ?Multiple Vitamin (MULTIVITAMIN) LIQD Take 15 mLs by mouth daily. ?Patient not taking: Reported on 03/03/2022 02/08/22   Mercy Riding, MD  ?nicotine (NICODERM CQ - DOSED IN MG/24 HOURS) 21 mg/24hr patch Place 1 patch (21 mg total) onto the skin daily. 03/07/22   Oswald Hillock, MD  ?ondansetron (ZOFRAN-ODT) 8 MG disintegrating tablet Dissolve 1 tablet (8 mg total) by mouth every 8 (eight) hours as needed for nausea or vomiting. ?Patient not taking: Reported on 03/03/2022 02/08/22   Mercy Riding, MD  ?oxyCODONE (OXY IR/ROXICODONE) 5 MG immediate release tablet Take 1 tablet (5 mg total) by mouth every 6 (six) hours as needed for severe pain. 03/07/22   Oswald Hillock, MD  ?pantoprazole (PROTONIX) 40 MG tablet Take 1 tablet (40 mg total) by mouth 2 (two) times daily. 03/07/22 06/05/22  Oswald Hillock, MD  ?sucralfate (CARAFATE) 1 GM/10ML suspension Take 10 mLs (1 g total) by mouth 4 (four) times daily -  with meals and at bedtime. 03/07/22   Oswald Hillock, MD  ?tiotropium (SPIRIVA HANDIHALER) 18 MCG inhalation capsule Place 1 capsule (18 mcg total) into inhaler and inhale daily. ?Patient not taking: Reported on 03/03/2022 02/08/22 05/09/22  Mercy Riding, MD  ? ? ?Scheduled Meds: ?Continuous Infusions: ?PRN Meds:. ? ?Allergies as of 03/13/2022 - Review Complete 03/13/2022  ?Allergen Reaction Noted  ? Naproxen Nausea Only 09/10/2021  ? Bactrim [sulfamethoxazole-trimethoprim] Nausea Only 04/06/2012  ? ? ?Family History  ?Problem Relation Age of Onset  ?  Hypertension Mother   ? Hypertension Father   ? Heart disease Father   ? ? ?Social History  ? ?Socioeconomic History  ? Marital status: Divorced  ?  Spouse name: Not on file  ? Number of children: Not on file  ? Years of education: Not on file  ? Highest education level: Not on file  ?Occupational History  ? Not on file  ?Tobacco Use  ? Smoking status: Every Day  ?  Packs/day: 0.50  ?  Years: 30.00  ?  Pack years: 15.00  ?  Types: Cigarettes  ? Smokeless  tobacco: Never  ?Vaping Use  ? Vaping Use: Some days  ? Substances: Flavoring  ?Substance and Sexual Activity  ? Alcohol use: Not Currently  ?  Comment: rare  ? Drug use: Not Currently  ?  Types: Marijuana  ?  Co

## 2022-03-13 NOTE — Progress Notes (Signed)
Pt. Taken back to ER. Per Er physician to be admitted for observation.  ?

## 2022-03-14 ENCOUNTER — Ambulatory Visit: Payer: Medicaid Other

## 2022-03-14 ENCOUNTER — Encounter (HOSPITAL_COMMUNITY): Payer: Self-pay | Admitting: Gastroenterology

## 2022-03-14 DIAGNOSIS — F418 Other specified anxiety disorders: Secondary | ICD-10-CM | POA: Diagnosis not present

## 2022-03-14 DIAGNOSIS — Z79899 Other long term (current) drug therapy: Secondary | ICD-10-CM | POA: Diagnosis not present

## 2022-03-14 DIAGNOSIS — K2210 Ulcer of esophagus without bleeding: Secondary | ICD-10-CM | POA: Diagnosis not present

## 2022-03-14 DIAGNOSIS — Z7901 Long term (current) use of anticoagulants: Secondary | ICD-10-CM | POA: Diagnosis not present

## 2022-03-14 DIAGNOSIS — Z886 Allergy status to analgesic agent status: Secondary | ICD-10-CM | POA: Diagnosis not present

## 2022-03-14 DIAGNOSIS — D539 Nutritional anemia, unspecified: Secondary | ICD-10-CM

## 2022-03-14 DIAGNOSIS — R1310 Dysphagia, unspecified: Secondary | ICD-10-CM | POA: Diagnosis present

## 2022-03-14 DIAGNOSIS — C3411 Malignant neoplasm of upper lobe, right bronchus or lung: Secondary | ICD-10-CM | POA: Diagnosis present

## 2022-03-14 DIAGNOSIS — Y838 Other surgical procedures as the cause of abnormal reaction of the patient, or of later complication, without mention of misadventure at the time of the procedure: Secondary | ICD-10-CM | POA: Diagnosis present

## 2022-03-14 DIAGNOSIS — K222 Esophageal obstruction: Secondary | ICD-10-CM | POA: Diagnosis present

## 2022-03-14 DIAGNOSIS — J449 Chronic obstructive pulmonary disease, unspecified: Secondary | ICD-10-CM

## 2022-03-14 DIAGNOSIS — K208 Other esophagitis without bleeding: Secondary | ICD-10-CM | POA: Diagnosis present

## 2022-03-14 DIAGNOSIS — T85528A Displacement of other gastrointestinal prosthetic devices, implants and grafts, initial encounter: Secondary | ICD-10-CM | POA: Diagnosis not present

## 2022-03-14 DIAGNOSIS — T66XXXA Radiation sickness, unspecified, initial encounter: Secondary | ICD-10-CM

## 2022-03-14 DIAGNOSIS — I959 Hypotension, unspecified: Secondary | ICD-10-CM | POA: Diagnosis not present

## 2022-03-14 DIAGNOSIS — Y842 Radiological procedure and radiotherapy as the cause of abnormal reaction of the patient, or of later complication, without mention of misadventure at the time of the procedure: Secondary | ICD-10-CM | POA: Diagnosis present

## 2022-03-14 DIAGNOSIS — E876 Hypokalemia: Secondary | ICD-10-CM | POA: Diagnosis present

## 2022-03-14 DIAGNOSIS — E162 Hypoglycemia, unspecified: Secondary | ICD-10-CM | POA: Diagnosis present

## 2022-03-14 DIAGNOSIS — I82621 Acute embolism and thrombosis of deep veins of right upper extremity: Secondary | ICD-10-CM

## 2022-03-14 DIAGNOSIS — D7589 Other specified diseases of blood and blood-forming organs: Secondary | ICD-10-CM | POA: Diagnosis present

## 2022-03-14 DIAGNOSIS — Z888 Allergy status to other drugs, medicaments and biological substances status: Secondary | ICD-10-CM | POA: Diagnosis not present

## 2022-03-14 DIAGNOSIS — Z85118 Personal history of other malignant neoplasm of bronchus and lung: Secondary | ICD-10-CM | POA: Diagnosis not present

## 2022-03-14 DIAGNOSIS — F17210 Nicotine dependence, cigarettes, uncomplicated: Secondary | ICD-10-CM | POA: Diagnosis present

## 2022-03-14 DIAGNOSIS — Z882 Allergy status to sulfonamides status: Secondary | ICD-10-CM | POA: Diagnosis not present

## 2022-03-14 LAB — GLUCOSE, CAPILLARY
Glucose-Capillary: 105 mg/dL — ABNORMAL HIGH (ref 70–99)
Glucose-Capillary: 106 mg/dL — ABNORMAL HIGH (ref 70–99)
Glucose-Capillary: 107 mg/dL — ABNORMAL HIGH (ref 70–99)
Glucose-Capillary: 114 mg/dL — ABNORMAL HIGH (ref 70–99)
Glucose-Capillary: 138 mg/dL — ABNORMAL HIGH (ref 70–99)
Glucose-Capillary: 92 mg/dL (ref 70–99)

## 2022-03-14 LAB — URINALYSIS, ROUTINE W REFLEX MICROSCOPIC
Bilirubin Urine: NEGATIVE
Glucose, UA: NEGATIVE mg/dL
Ketones, ur: NEGATIVE mg/dL
Leukocytes,Ua: NEGATIVE
Nitrite: POSITIVE — AB
Protein, ur: NEGATIVE mg/dL
Specific Gravity, Urine: 1.008 (ref 1.005–1.030)
pH: 7 (ref 5.0–8.0)

## 2022-03-14 LAB — BASIC METABOLIC PANEL
Anion gap: 8 (ref 5–15)
BUN: 7 mg/dL (ref 6–20)
CO2: 22 mmol/L (ref 22–32)
Calcium: 8.8 mg/dL — ABNORMAL LOW (ref 8.9–10.3)
Chloride: 106 mmol/L (ref 98–111)
Creatinine, Ser: 0.96 mg/dL (ref 0.44–1.00)
GFR, Estimated: >60 mL/min (ref >60)
Glucose, Bld: 89 mg/dL (ref 70–99)
Potassium: 3.4 mmol/L — ABNORMAL LOW (ref 3.5–5.1)
Sodium: 136 mmol/L (ref 135–145)

## 2022-03-14 LAB — CBC
HCT: 30.4 % — ABNORMAL LOW (ref 36.0–46.0)
Hemoglobin: 9.8 g/dL — ABNORMAL LOW (ref 12.0–15.0)
MCH: 35.1 pg — ABNORMAL HIGH (ref 26.0–34.0)
MCHC: 32.2 g/dL (ref 30.0–36.0)
MCV: 109 fL — ABNORMAL HIGH (ref 80.0–100.0)
Platelets: 199 10*3/uL (ref 150–400)
RBC: 2.79 MIL/uL — ABNORMAL LOW (ref 3.87–5.11)
RDW: 14.5 % (ref 11.5–15.5)
WBC: 6.6 10*3/uL (ref 4.0–10.5)
nRBC: 0 % (ref 0.0–0.2)

## 2022-03-14 LAB — COMPREHENSIVE METABOLIC PANEL
ALT: 8 U/L (ref 0–44)
AST: 11 U/L — ABNORMAL LOW (ref 15–41)
Albumin: 3 g/dL — ABNORMAL LOW (ref 3.5–5.0)
Alkaline Phosphatase: 50 U/L (ref 38–126)
Anion gap: 5 (ref 5–15)
BUN: 9 mg/dL (ref 6–20)
CO2: 25 mmol/L (ref 22–32)
Calcium: 8.6 mg/dL — ABNORMAL LOW (ref 8.9–10.3)
Chloride: 107 mmol/L (ref 98–111)
Creatinine, Ser: 1.03 mg/dL — ABNORMAL HIGH (ref 0.44–1.00)
GFR, Estimated: >60 mL/min (ref >60)
Glucose, Bld: 108 mg/dL — ABNORMAL HIGH (ref 70–99)
Potassium: 3.3 mmol/L — ABNORMAL LOW (ref 3.5–5.1)
Sodium: 137 mmol/L (ref 135–145)
Total Bilirubin: 0.6 mg/dL (ref 0.3–1.2)
Total Protein: 5.9 g/dL — ABNORMAL LOW (ref 6.5–8.1)

## 2022-03-14 LAB — MAGNESIUM: Magnesium: 2 mg/dL (ref 1.7–2.4)

## 2022-03-14 NOTE — Progress Notes (Signed)
?PROGRESS NOTE ? ? ? ?[name]  FYB:[id number] DOB: 08-23-1971 DOA: 03/13/2022 ?PCP: [name], PA-C  ? ?Brief Narrative:  ?51 y.o. 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  Imaging showed esophageal stent migration, and distal esophagus extending into the stomach.  She underwent EGD and stent removal on 03/13/2022 by GI. ? ?Assessment & Plan: ?  ?Patient with a stenosis/stricture with recent stent placement/radiation-induced esophagitis ?Dysphagia ?-Patient had recent esophageal stent placed on 03/06/2022.  Presented with precordial chest pain and was found to have migrated esophageal stent.  Status post EGD and stent removal on 03/13/2022. ?-Currently on clear liquid diet.  Diet advancement as per GI.  Follow further GI recommendations. ?-Continue IV Protonix.  Continue IV fluids. ? ?Hypokalemia ?-Continue supplementation with IV fluids ? ?Hypomagnesemia ?-Resolved ? ?DVT of upper extremity ?-Eliquis on hold.  Will resume once cleared by GI. ? ?Anemia of chronic disease ?Macrocytosis ?-Hemoglobin stable.  Monitor intermittently ? ?COPD ?-Respiratory status currently stable.  Continue as needed albuterol ? ?History of lung cancer with radiation therapy ?-Outpatient follow-up with oncology ? ? ? ?DVT prophylaxis: Eliquis on hold ?Code Status: Full ?Family Communication: Mother and father at bedside ?Disposition Plan: ?Status is: Observation ?The patient will require care spanning > 2 midnights and should be moved to inpatient because: Of severity of illness.  Will need to monitor her for advancement of diet ? ? ?Consultants: GI ? ?Procedures: EGD and removal of  esophageal stent on 4 9023 ? ?Antimicrobials: None ? ? ?Subjective: ?Patient seen and examined at bedside.  Denies any current nausea.  Tolerated clear liquid diet.  Hoping to advance diet today.  No fever, worsening abdominal pain, diarrhea reported. ? ?Objective: ?Vitals:  ? 03/13/22 1700 03/13/22 1722 03/13/22 2136 03/14/22 0454  ?BP:  102/69 95/73 105/81  ?Pulse:  (!) 108 100 (!) 106  ?Resp:  (!) 21 20 20   ?Temp:   97.9 ?F (36.6 ?C) 98.1 ?F (36.7 ?C)  ?TempSrc:  Oral Oral Oral  ?SpO2:  100% 95% 92%  ?Weight: 64 kg     ?Height: 5\' 8"  (1.727 m)     ? ? ?Intake/Output Summary (Last 24 hours) at 03/14/2022 1106 ?Last data filed at 03/14/2022 0600 ?Gross per 24 hour  ?Intake 1919.8 ml  ?Output --  ?Net 1919.8 ml  ? ?Filed Weights  ? 03/13/22 1506 03/13/22 1600 03/13/22 1700  ?Weight: 64 kg 64 kg 64 kg  ? ? ?Examination: ? ?General exam: Appears calm and comfortable.  Currently on room air. ?Respiratory system: Bilateral decreased breath sounds at bases with no wheezing ?Cardiovascular system: S1 & S2 heard, intermittently tachycardic ?Gastrointestinal system: Abdomen is nondistended, soft and nontender. Normal bowel sounds heard. ?Extremities: No cyanosis, clubbing; mild lower extremity edema. ?Central nervous system: Alert and oriented. No focal neurological deficits. Moving extremities ?Skin: No rashes, lesions or ulcers ?Psychiatry: Judgement and insight appear normal. Mood & affect appropriate.  ? ? ? ?Data Reviewed: I have personally reviewed following labs and imaging studies ? ?CBC: ?Recent Labs  ?Lab 03/13/22 ?0926 03/14/22 ?0350  ?WBC 8.4 6.6  ?HGB 11.6* 9.8*  ?HCT 34.5* 30.4*  ?MCV 106.5* 109.0*  ?PLT 210 199  ? ?Basic Metabolic Panel: ?Recent Labs  ?Lab 03/13/22 ?630 199 1798  03/14/22 ?0350  ?NA 137 137  ?K 3.6 3.3*  ?CL 106 107  ?CO2 21* 25  ?GLUCOSE 85 108*  ?BUN 12 9  ?CREATININE 1.06* 1.03*  ?CALCIUM 9.4 8.6*  ?MG 1.6* 2.0  ? ?GFR: ?Estimated Creatinine Clearance: 65.9 mL/min (A) (by C-G formula based on SCr of  1.03 mg/dL (H)). ?Liver Function Tests: ?Recent Labs  ?Lab 03/14/22 ?0350  ?AST 11*  ?ALT 8  ?ALKPHOS 50  ?BILITOT 0.6  ?PROT 5.9*  ?ALBUMIN 3.0*  ? ?No results for input(s): LIPASE, AMYLASE in the last 168 hours. ?No results for input(s): AMMONIA in the last 168 hours. ?Coagulation Profile: ?No results for input(s): INR, PROTIME in the last 168 hours. ?Cardiac Enzymes: ?No results for input(s): CKTOTAL, CKMB, CKMBINDEX, TROPONINI in the last 168 hours. ?BNP (last 3 results) ?No results for input(s): PROBNP in the last 8760 hours. ?HbA1C: ?No results for input(s): HGBA1C in the last 72 hours. ?CBG: ?Recent Labs  ?Lab 03/13/22 ?1408 03/13/22 ?1950 03/13/22 ?0786 03/14/22 ?0406 03/14/22 ?7544  ?GLUCAP 66* 118* 106* 105* 114*  ? ?Lipid Profile: ?No results for input(s): CHOL, HDL, LDLCALC, TRIG, CHOLHDL, LDLDIRECT in the last 72 hours. ?Thyroid Function Tests: ?No results for input(s): TSH, T4TOTAL, FREET4, T3FREE, THYROIDAB in the last 72 hours. ?Anemia Panel: ?No results for input(s): VITAMINB12, FOLATE, FERRITIN, TIBC, IRON, RETICCTPCT in the last 72 hours. ?Sepsis Labs: ?No results for input(s): PROCALCITON, LATICACIDVEN in the last 168 hours. ? ?No results found for this or any previous visit (from the past 240 hour(s)).  ? ? ? ? ? ?Radiology Studies: ?DG ESOPHAGUS W SINGLE CM (SOL OR THIN BA) ? ?Result Date: 03/13/2022 ?CLINICAL DATA:  51 year old with history of lung cancer status post radiation therapy with known radiation induced esophagitis and esophageal stricture. History of food impaction, status post removal of impaction and esophageal stent placement on 03/06/2022, who presented to emergency department today due to chest pain. Request for single-contrast esophagram for further evaluation. EXAM: ESOPHAGUS/BARIUM SWALLOW/TABLET STUDY TECHNIQUE: Single contrast examination was performed using water-soluble contrast. This exam was performed by Durenda Guthrie, PA-C, and was supervised and interpreted by Lorin Picket, MD. FLUOROSCOPY: Radiation Exposure Index (as provided by the fluoroscopic device): 3.20 mGy Kerma COMPARISON:  C-arm images during EGD on 03/06/2022, esophagram on 12/24/2021. FINDINGS: Scout AP chest appears normal. Esophageal stent visualized at distal esophagus extending into the stomach. Swallowing: Not examined. Pharynx: Not examined. Esophagus: Moderate narrowing of the esophagus at the junction of the upper and middle thirds, as on recent esophagram. As noted above, the wall stent has migrated distally, flanking the gastroesophageal junction. Esophageal motility: Not examined. Hiatal Hernia: None. Gastroesophageal reflux: Not examined. Ingested 13mm barium tablet: Not given. Other: No contrast extravasation into pleural/mediastinal space noted. IMPRESSION: 1. Esophageal stent migration, currently at distal esophagus extending into the stomach. 2. Short segment moderate esophageal stricture at the junction of the upper and mid esophagus. 3.  No leak. Electronically Signed   By: Lorin Picket M.D.   On: 03/13/2022 13:09   ? ? ? ? ? ?Scheduled Meds: ? ondansetron (ZOFRAN) IV  4 mg Intravenous Once  ? pantoprazole (PROTONIX) IV  40 mg Intravenous Q12H  ? ?Continuous Infusions: ? sodium chloride 20 mL/hr at 03/13/22 1833  ? dextrose 5 % and 0.9 % NaCl with KCl 20 mEq/L 75 mL/hr at 03/14/22 9201  ? ? ? ? ? ? ? ? ?Aline August, MD ?[facility] ?03/14/2022, 11:06 AM  ? ?

## 2022-03-14 NOTE — H&P (View-Only) (Signed)
[facility] Progress Note ? ?[name] 51 y.o. [birth year] ? ?CC:  Esophageal stent removal ? ? ?Subjective: ?Patient examined laying in bed today.  Patient was able to eat pancakes and cereal this morning.  Notes that she did feel the food getting hung while she was eating.  Notes she continues to have chest discomfort.  Notes pain is worse when swallowing foods.  She is upset and feels like she is back to being unable to eat again. ? ?ROS : Review of Systems  ?Constitutional:  Negative for chills and fever.  ?Gastrointestinal:  Negative for abdominal pain, blood in stool, constipation, diarrhea, heartburn, melena, nausea and vomiting.  ?     Pain with swallowing ?Chest pain likely esophageal   ? ? ?Objective: ?Vital signs in last 24 hours: ?Vitals:  ? 03/13/22 2136 03/14/22 0454  ?BP: 95/73 105/81  ?Pulse: 100 (!) 106  ?Resp: 20 20  ?Temp: 97.9 ?F (36.6 ?C) 98.1 ?F (36.7 ?C)  ?SpO2: 95% 92%  ? ? ?Physical Exam: ? ?General:  Alert, cooperative, no distress, appears stated age  ?Head:  Normocephalic, without obvious abnormality, atraumatic  ?Eyes:  Anicteric sclera, EOM's intact  ?Lungs:   Wheezing on the right side. respirations unlabored  ?Heart:  Regular rate and rhythm, S1, S2 normal  ?Abdomen:   Soft, non-tender, bowel sounds active all four quadrants,  no masses,   ?Extremities: Extremities normal, atraumatic, no  edema  ?Pulses: 2+ and symmetric  ? ? ?Lab Results: ?Recent Labs  ?  03/13/22 ?0926 03/14/22 ?0350  ?NA 137 137  ?K 3.6 3.3*  ?CL 106 107  ?CO2 21* 25  ?GLUCOSE 85 108*  ?BUN 12 9  ?CREATININE 1.06* 1.03*  ?CALCIUM 9.4 8.6*  ?MG 1.6* 2.0  ? ?Recent Labs  ?  03/14/22 ?0350  ?AST 11*  ?ALT 8  ?ALKPHOS 50  ?BILITOT 0.6  ?PROT 5.9*  ?ALBUMIN 3.0*  ? ?Recent Labs  ?  03/13/22 ?0926 03/14/22 ?0350  ?WBC 8.4 6.6  ?HGB 11.6* 9.8*  ?HCT 34.5* 30.4*  ?MCV 106.5* 109.0*  ?PLT 210 199  ? ?No results for input(s): LABPROT, INR in the last 72 hours. ? ? ? ?Assessment ?Dysphagia ?Esophageal  stricture ?Radiation induced esophagitis ? ?EGD 4/19 ?- Normal larynx. ?- Benign-appearing esophageal stenosis. ?- Pre-existing esophageal stent which had migrated distally, it was removed. ?- Retained gastric fluid. Fluid aspiration performed. ?- Normal duodenal bulb. ?- The examination was otherwise normal. ? ?HGB 9.8 (11.6) Platelets 199 ? ?Continues to have upper GI pain with eating and at rest.  Continued dysphagia.  ? ?Plan: ?- Will discuss with Dr. Watt Climes plans for further stent replacement for dysphagia management. Possible outpatient EGD for stent placement next week. ?-We will start full liquid diet given dysphagia.  ?-Continue supportive care ?- Continue to monitor hemoglobin and transfuse if less than 7 ?- [facility] will follow ? ? ?Charlott Rakes PA-C ?03/14/2022, 10:53 AM ? ?Contact #  [phone]  ?

## 2022-03-14 NOTE — Progress Notes (Signed)
[name] ? ?[place] Pandey Calabrese 51 y.o. Apr 10, 1971 ? ?CC:  Esophageal stent removal ? ? ?Subjective: ?Patient examined laying in bed today.  Patient was able to eat pancakes and cereal this morning.  Notes that she did feel the food getting hung while she was eating.  Notes she continues to have chest discomfort.  Notes pain is worse when swallowing foods.  She is upset and feels like she is back to being unable to eat again. ? ?ROS : Review of Systems  ?Constitutional:  Negative for chills and fever.  ?Gastrointestinal:  Negative for abdominal pain, blood in stool, constipation, diarrhea, heartburn, melena, nausea and vomiting.  ?     Pain with swallowing ?Chest pain likely esophageal   ? ? ?Objective: ?Vital signs in last 24 hours: ?Vitals:  ? 03/13/22 2136 03/14/22 0454  ?BP: 95/73 105/81  ?Pulse: 100 (!) 106  ?Resp: 20 20  ?Temp: 97.9 ?F (36.6 ?C) 98.1 ?F (36.7 ?C)  ?SpO2: 95% 92%  ? ? ?Physical Exam: ? ?General:  Alert, cooperative, no distress, appears stated age  ?Head:  Normocephalic, without obvious abnormality, atraumatic  ?Eyes:  Anicteric sclera, EOM's intact  ?Lungs:   Wheezing on the right side. respirations unlabored  ?Heart:  Regular rate and rhythm, S1, S2 normal  ?Abdomen:   Soft, non-tender, bowel sounds active all four quadrants,  no masses,   ?Extremities: Extremities normal, atraumatic, no  edema  ?Pulses: 2+ and symmetric  ? ? ?Lab Results: ?Recent Labs  ?  03/13/22 ?0926 03/14/22 ?0350  ?NA 137 137  ?K 3.6 3.3*  ?CL 106 107  ?CO2 21* 25  ?GLUCOSE 85 108*  ?BUN 12 9  ?CREATININE 1.06* 1.03*  ?CALCIUM 9.4 8.6*  ?MG 1.6* 2.0  ? ?Recent Labs  ?  03/14/22 ?0350  ?AST 11*  ?ALT 8  ?ALKPHOS 50  ?BILITOT 0.6  ?PROT 5.9*  ?ALBUMIN 3.0*  ? ?Recent Labs  ?  03/13/22 ?0926 03/14/22 ?0350  ?WBC 8.4 6.6  ?HGB 11.6* 9.8*  ?HCT 34.5* 30.4*  ?MCV 106.5* 109.0*  ?PLT 210 199  ? ?No results for input(s): LABPROT, INR in the last 72 hours. ? ? ? ?Assessment ?Dysphagia ?Esophageal  stricture ?Radiation induced esophagitis ? ?EGD 4/19 ?- Normal larynx. ?- Benign-appearing esophageal stenosis. ?- Pre-existing esophageal stent which had migrated distally, it was removed. ?- Retained gastric fluid. Fluid aspiration performed. ?- Normal duodenal bulb. ?- The examination was otherwise normal. ? ?HGB 9.8 (11.6) Platelets 199 ? ?Continues to have upper GI pain with eating and at rest.  Continued dysphagia.  ? ?Plan: ?- Will discuss with Dr. Watt Climes plans for further stent replacement for dysphagia management. Possible outpatient EGD for stent placement next week. ?-We will start full liquid diet given dysphagia.  ?-Continue supportive care ?- Continue to monitor hemoglobin and transfuse if less than 7 ?- [facility] will follow ? ? ?Charlott Rakes PA-C ?03/14/2022, 10:53 AM ? ?Contact #  [phone]  ?

## 2022-03-14 NOTE — Anesthesia Postprocedure Evaluation (Signed)
Anesthesia Post Note ? ?Patient: [name] ? ?Procedure(s) Performed: ESOPHAGOGASTRODUODENOSCOPY (EGD) WITH PROPOFOL ?STENT REMOVAL ? ?  ? ?Patient location during evaluation: Endoscopy ?Anesthesia Type: MAC ?Level of consciousness: awake and alert ?Pain management: pain level controlled ?Vital Signs Assessment: post-procedure vital signs reviewed and stable ?Respiratory status: spontaneous breathing, nonlabored ventilation, respiratory function stable and patient connected to nasal cannula oxygen ?Cardiovascular status: stable and blood pressure returned to baseline ?Postop Assessment: no apparent nausea or vomiting ?Anesthetic complications: no ? ? ?No notable events documented. ? ?Last Vitals:  ?Vitals:  ? 03/13/22 2136 03/14/22 0454  ?BP: 95/73 105/81  ?Pulse: 100 (!) 106  ?Resp: 20 20  ?Temp: 36.6 ?C 36.7 ?C  ?SpO2: 95% 92%  ?  ?Last Pain:  ?Vitals:  ? 03/14/22 0609  ?TempSrc:   ?PainSc: 9   ? ? ?  ?  ?  ?  ?  ?  ? ?[name] ? ? ? ? ?

## 2022-03-15 ENCOUNTER — Inpatient Hospital Stay: Payer: Medicaid Other | Attending: Radiation Oncology

## 2022-03-15 ENCOUNTER — Encounter (HOSPITAL_COMMUNITY)
Admission: EM | Disposition: A | Discharge status: Home / Self Care | Payer: Self-pay | Source: Home / Self Care | Attending: Internal Medicine

## 2022-03-15 ENCOUNTER — Encounter (HOSPITAL_COMMUNITY): Payer: Self-pay | Admitting: Internal Medicine

## 2022-03-15 ENCOUNTER — Ambulatory Visit (HOSPITAL_COMMUNITY): Admission: RE | Admit: 2022-03-15 | Payer: Medicaid Other | Source: Ambulatory Visit

## 2022-03-15 ENCOUNTER — Inpatient Hospital Stay (HOSPITAL_COMMUNITY): Payer: Medicaid Other | Admitting: Anesthesiology

## 2022-03-15 ENCOUNTER — Ambulatory Visit: Payer: Medicaid Other

## 2022-03-15 ENCOUNTER — Encounter (HOSPITAL_COMMUNITY): Payer: Self-pay

## 2022-03-15 DIAGNOSIS — K222 Esophageal obstruction: Secondary | ICD-10-CM

## 2022-03-15 DIAGNOSIS — C3411 Malignant neoplasm of upper lobe, right bronchus or lung: Secondary | ICD-10-CM

## 2022-03-15 DIAGNOSIS — I82621 Acute embolism and thrombosis of deep veins of right upper extremity: Secondary | ICD-10-CM | POA: Diagnosis not present

## 2022-03-15 DIAGNOSIS — K2210 Ulcer of esophagus without bleeding: Secondary | ICD-10-CM

## 2022-03-15 DIAGNOSIS — J449 Chronic obstructive pulmonary disease, unspecified: Secondary | ICD-10-CM | POA: Diagnosis not present

## 2022-03-15 DIAGNOSIS — R1310 Dysphagia, unspecified: Secondary | ICD-10-CM | POA: Diagnosis not present

## 2022-03-15 DIAGNOSIS — G893 Neoplasm related pain (acute) (chronic): Secondary | ICD-10-CM | POA: Insufficient documentation

## 2022-03-15 DIAGNOSIS — F418 Other specified anxiety disorders: Secondary | ICD-10-CM

## 2022-03-15 HISTORY — PX: BALLOON DILATION: SHX5330

## 2022-03-15 HISTORY — PX: ESOPHAGOGASTRODUODENOSCOPY: SHX5428

## 2022-03-15 LAB — GLUCOSE, CAPILLARY
Glucose-Capillary: 100 mg/dL — ABNORMAL HIGH (ref 70–99)
Glucose-Capillary: 107 mg/dL — ABNORMAL HIGH (ref 70–99)
Glucose-Capillary: 110 mg/dL — ABNORMAL HIGH (ref 70–99)
Glucose-Capillary: 113 mg/dL — ABNORMAL HIGH (ref 70–99)
Glucose-Capillary: 116 mg/dL — ABNORMAL HIGH (ref 70–99)
Glucose-Capillary: 95 mg/dL (ref 70–99)

## 2022-03-15 LAB — MAGNESIUM: Magnesium: 1.6 mg/dL — ABNORMAL LOW (ref 1.7–2.4)

## 2022-03-15 SURGERY — EGD (ESOPHAGOGASTRODUODENOSCOPY)
Anesthesia: Monitor Anesthesia Care

## 2022-03-15 MED ORDER — HYDROMORPHONE HCL 1 MG/ML IJ SOLN
INTRAMUSCULAR | Status: AC
  Filled 2022-03-15: qty 1

## 2022-03-15 MED ORDER — DIPHENHYDRAMINE-APAP (SLEEP) 25-500 MG PO TABS: 3.0000 | ORAL_TABLET | Freq: Every evening | ORAL | Status: DC | PRN

## 2022-03-15 MED ORDER — ADULT MULTIVITAMIN LIQUID CH: 15.0000 mL | Freq: Every day | ORAL | Status: DC

## 2022-03-15 MED ORDER — DIPHENHYDRAMINE HCL 50 MG/ML IJ SOLN
25.0000 mg | Freq: Once | INTRAMUSCULAR | Status: AC
Start: 2022-03-15 — End: 2022-03-15
  Administered 2022-03-15: 25 mg via INTRAVENOUS
  Filled 2022-03-15: qty 1

## 2022-03-15 MED ORDER — NICOTINE 21 MG/24HR TD PT24
21.0000 mg | MEDICATED_PATCH | Freq: Every day | TRANSDERMAL | Status: DC
  Administered 2022-03-15 – 2022-03-16 (×2): 21 mg via TRANSDERMAL
  Filled 2022-03-15 (×2): qty 1

## 2022-03-15 MED ORDER — SUCRALFATE 1 GM/10ML PO SUSP
1.0000 g | Freq: Three times a day (TID) | ORAL | Status: DC
  Administered 2022-03-15 – 2022-03-17 (×7): 1 g via ORAL
  Filled 2022-03-15 (×7): qty 10

## 2022-03-15 MED ORDER — SUCRALFATE 1 GM/10ML PO SUSP: 1.0000 g | Freq: Three times a day (TID) | ORAL | Status: DC

## 2022-03-15 MED ORDER — TIOTROPIUM BROMIDE MONOHYDRATE 18 MCG IN CAPS: 18.0000 ug | ORAL_CAPSULE | Freq: Every day | RESPIRATORY_TRACT | Status: DC

## 2022-03-15 MED ORDER — DIPHENHYDRAMINE HCL 25 MG PO CAPS
75.0000 mg | ORAL_CAPSULE | Freq: Every evening | ORAL | Status: DC | PRN
  Filled 2022-03-15: qty 3

## 2022-03-15 MED ORDER — OXYCODONE HCL 5 MG PO TABS
5.0000 mg | ORAL_TABLET | Freq: Four times a day (QID) | ORAL | Status: DC | PRN
  Administered 2022-03-16 – 2022-03-17 (×5): 5 mg via ORAL
  Filled 2022-03-15 (×6): qty 1

## 2022-03-15 MED ORDER — LACTATED RINGERS IV SOLN: INTRAVENOUS | Status: DC | PRN

## 2022-03-15 MED ORDER — ALBUTEROL SULFATE HFA 108 (90 BASE) MCG/ACT IN AERS: 2.0000 | INHALATION_SPRAY | RESPIRATORY_TRACT | Status: DC | PRN

## 2022-03-15 MED ORDER — ONDANSETRON 4 MG PO TBDP: 8.0000 mg | ORAL_TABLET | Freq: Three times a day (TID) | ORAL | Status: DC | PRN

## 2022-03-15 MED ORDER — APIXABAN 5 MG PO TABS: 5.0000 mg | ORAL_TABLET | Freq: Two times a day (BID) | ORAL | Status: DC

## 2022-03-15 MED ORDER — ACETAMINOPHEN 500 MG PO TABS
1500.0000 mg | ORAL_TABLET | Freq: Every evening | ORAL | Status: DC | PRN
  Administered 2022-03-16: 1500 mg via ORAL
  Filled 2022-03-15: qty 3

## 2022-03-15 MED ORDER — ALBUTEROL SULFATE HFA 108 (90 BASE) MCG/ACT IN AERS
2.0000 | INHALATION_SPRAY | RESPIRATORY_TRACT | Status: DC | PRN
  Administered 2022-03-15: 2 via RESPIRATORY_TRACT
  Filled 2022-03-15: qty 6.7

## 2022-03-15 MED ORDER — ALBUTEROL SULFATE HFA 108 (90 BASE) MCG/ACT IN AERS: 2.0000 | INHALATION_SPRAY | Freq: Four times a day (QID) | RESPIRATORY_TRACT | Status: DC | PRN

## 2022-03-15 MED ORDER — PANTOPRAZOLE SODIUM 40 MG PO TBEC: 40.0000 mg | DELAYED_RELEASE_TABLET | Freq: Two times a day (BID) | ORAL | Status: DC

## 2022-03-15 MED ORDER — METOPROLOL TARTRATE 25 MG PO TABS: 25.0000 mg | ORAL_TABLET | Freq: Two times a day (BID) | ORAL | Status: DC

## 2022-03-15 MED ORDER — ALPRAZOLAM 0.25 MG PO TABS
0.2500 mg | ORAL_TABLET | Freq: Once | ORAL | Status: AC
  Administered 2022-03-15: 0.25 mg via ORAL
  Filled 2022-03-15: qty 1

## 2022-03-15 MED ORDER — MAGNESIUM SULFATE 2 GM/50ML IV SOLN
2.0000 g | Freq: Once | INTRAVENOUS | Status: AC
  Administered 2022-03-15: 2 g via INTRAVENOUS
  Filled 2022-03-15: qty 50

## 2022-03-15 MED ORDER — FOLIC ACID 1 MG PO TABS
1.0000 mg | ORAL_TABLET | Freq: Every day | ORAL | Status: DC
  Administered 2022-03-16 – 2022-03-17 (×2): 1 mg via ORAL
  Filled 2022-03-15 (×3): qty 1

## 2022-03-15 MED ORDER — PROPOFOL 10 MG/ML IV BOLUS
INTRAVENOUS | Status: DC | PRN
  Administered 2022-03-15: 50 mg via INTRAVENOUS
  Administered 2022-03-15: 100 mg via INTRAVENOUS

## 2022-03-15 MED ORDER — LIDOCAINE 2% (20 MG/ML) 5 ML SYRINGE
INTRAMUSCULAR | Status: DC | PRN
Start: 2022-03-15 — End: 2022-03-15
  Administered 2022-03-15: 40 mg via INTRAVENOUS

## 2022-03-15 MED ORDER — ALPRAZOLAM 0.25 MG PO TABS
0.2500 mg | ORAL_TABLET | Freq: Three times a day (TID) | ORAL | Status: DC | PRN
  Administered 2022-03-15 (×2): 0.25 mg via ORAL
  Filled 2022-03-15 (×2): qty 1

## 2022-03-15 MED ORDER — PHENYLEPHRINE 80 MCG/ML (10ML) SYRINGE FOR IV PUSH (FOR BLOOD PRESSURE SUPPORT)
PREFILLED_SYRINGE | INTRAVENOUS | Status: DC | PRN
  Administered 2022-03-15 (×2): 160 ug via INTRAVENOUS

## 2022-03-15 MED ORDER — ALBUTEROL SULFATE (2.5 MG/3ML) 0.083% IN NEBU: 2.5000 mg | INHALATION_SOLUTION | RESPIRATORY_TRACT | Status: DC | PRN

## 2022-03-15 NOTE — Transfer of Care (Signed)
Immediate Anesthesia Transfer of Care Note ? ?Patient: [name] ? ?Procedure(s) Performed: ESOPHAGOGASTRODUODENOSCOPY (EGD) ?BALLOON DILATION ? ?Patient Location: PACU and Endoscopy Unit ? ?Anesthesia Type:MAC ? ?Level of Consciousness: awake, alert  and oriented ? ?Airway & Oxygen Therapy: Patient Spontanous Breathing and Patient connected to face mask ? ?Post-op Assessment: Report given to RN and Post -op Vital signs reviewed and stable ? ?Post vital signs: Reviewed and stable ? ?Last Vitals:  ?Vitals Value Taken Time  ?BP    ?Temp    ?Pulse    ?Resp    ?SpO2    ? ? ?Last Pain:  ?Vitals:  ? 03/15/22 1136  ?TempSrc: Tympanic  ?PainSc: 8   ?   ? ?Patients Stated Pain Goal: 3 (03/15/22 1051) ? ?Complications: No notable events documented. ?

## 2022-03-15 NOTE — Anesthesia Preprocedure Evaluation (Signed)
Anesthesia Evaluation  ?Patient identified by MRN, date of birth, ID band ?Patient awake ? ? ? ?Reviewed: ?Allergy & Precautions, NPO status , Patient's Chart, lab work & pertinent test results ? ?Airway ?Mallampati: II ? ?TM Distance: >3 FB ?Neck ROM: Full ? ? ? Dental ? ?(+) Partial Upper ?  ?Pulmonary ?asthma , COPD, Current Smoker and Patient abstained from smoking.,  ?Lung Ca ?  ?Pulmonary exam normal ? ? ? ? ? ? ? Cardiovascular ?negative cardio ROS ? ? ?Rhythm:Regular Rate:Normal ? ? ?  ?Neuro/Psych ?Anxiety Depression negative neurological ROS ?   ? GI/Hepatic ?Neg liver ROS, Esophageal stricture, dysphagia  ?  ?Endo/Other  ?negative endocrine ROS ? Renal/GU ?Renal InsufficiencyRenal disease  ?negative genitourinary ?  ?Musculoskeletal ? ?(+) Arthritis , Osteoarthritis,   ? Abdominal ?Normal abdominal exam  (+)   ?Peds ? Hematology ? ?(+) Blood dyscrasia, anemia ,   ?Anesthesia Other Findings ? ? Reproductive/Obstetrics ? ?  ? ? ? ? ? ? ? ? ? ? ? ? ? ?  ?  ? ? ? ? ? ? ? ? ?Anesthesia Physical ? ?Anesthesia Plan ? ?ASA: 3 ? ?Anesthesia Plan: MAC  ? ?Post-op Pain Management:   ? ?Induction: Intravenous ? ?PONV Risk Score and Plan: 1 ? ?Airway Management Planned: Simple Face Mask, Natural Airway and Nasal Cannula ? ?Additional Equipment: None ? ?Intra-op Plan:  ? ?Post-operative Plan:  ? ?Informed Consent: I have reviewed the patients History and Physical, chart, labs and discussed the procedure including the risks, benefits and alternatives for the proposed anesthesia with the patient or authorized representative who has indicated his/her understanding and acceptance.  ? ? ? ?Dental advisory given ? ?Plan Discussed with: CRNA ? ?Anesthesia Plan Comments:   ? ? ? ? ? ? ?Anesthesia Quick Evaluation ? ?

## 2022-03-15 NOTE — Progress Notes (Addendum)
?PROGRESS NOTE ? ? ? ?[name]  HCW:[id number] DOB: 30-Sep-1971 DOA: 03/13/2022 ?PCP: [name], PA-C  ? ?Brief Narrative:  ?51 y.o. 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  Imaging showed esophageal stent migration, and distal esophagus extending into the stomach.  She underwent EGD and stent removal on 03/13/2022 by GI. ? ?Assessment & Plan: ?  ?Patient with a stenosis/stricture with recent stent placement/radiation-induced esophagitis ?Dysphagia ?-Patient had recent esophageal stent placed on 03/06/2022.  Presented with precordial chest pain and was found to have migrated esophageal stent.  Status post EGD and stent removal on 03/13/2022. ?-Patient continued to have difficulty swallowing on solid diet on 03/14/2022 and was switched to full liquid diet.  GI is planning for EGD and possible dilatation today. ?-Continue IV Protonix.  Continue IV fluids. ? ?Hypokalemia ?-No labs today ? ?Hypomagnesemia ?-Replace.  Repeat a.m. labs. ? ?DVT of upper extremity ?-Eliquis on hold.  Will resume once cleared by GI. ? ?Anemia of chronic disease ?Macrocytosis ?-Hemoglobin stable.  Monitor intermittently ? ?COPD ?-Respiratory status currently stable.  Continue as needed albuterol ? ?History of lung cancer with radiation therapy ?-Outpatient follow-up with oncology ? ? ?DVT prophylaxis: Eliquis on hold ?Code Status: Full ?Family Communication: Mother and father at bedside on 03/14/2022 ?Disposition Plan: ?Status is: inpatient because: Of severity of illness.   ? ?Consultants: GI ? ?Procedures: EGD and removal of esophageal stent on 03/13/2022 ?Antimicrobials:  None ? ? ?Subjective: ?Patient seen and examined at bedside.  Denies any current chest pain, fever.  Has intermittent nausea. ? ?Objective: ?Vitals:  ? 03/14/22 0454 03/14/22 1433 03/14/22 2016 03/15/22 0446  ?BP: 105/81 125/87 106/75 91/67  ?Pulse: (!) 106 (!) 115 (!) 110 100  ?Resp: 20 19 20 20   ?Temp: 98.1 ?F (36.7 ?C) 98.3 ?F (36.8 ?C) 98 ?F (36.7 ?C) 98.3 ?F (36.8 ?C)  ?TempSrc: Oral Oral Oral Oral  ?SpO2: 92% 94% 92% 91%  ?Weight:      ?Height:      ? ? ?Intake/Output Summary (Last 24 hours) at 03/15/2022 0733 ?Last data filed at 03/15/2022 [id number] ?Gross per 24 hour  ?Intake 2070.06 ml  ?Output 150 ml  ?Net 1920.06 ml  ? ? ?Filed Weights  ? 03/13/22 1506 03/13/22 1600 03/13/22 1700  ?Weight: 64 kg 64 kg 64 kg  ? ? ?Examination: ? ?General exam: On room air.  No distress.   ?Respiratory system: Decreased breath sounds at bases bilaterally with some scattered crackles  ?cardiovascular system: Intermittently tachycardic; S1-S2 heard  ?gastrointestinal system: Abdomen is distended slightly, soft and nontender.  Bowel sounds are heard  ?extremities: Trace lower extremity edema present; no clubbing  ? ? ?Data Reviewed: I have personally reviewed following labs and imaging studies ? ?CBC: ?Recent Labs  ?Lab 03/13/22 ?0926 03/14/22 ?0350  ?WBC 8.4 6.6  ?HGB 11.6* 9.8*  ?HCT 34.5* 30.4*  ?MCV 106.5* 109.0*  ?PLT 210 199  ? ? ?Basic Metabolic Panel: ?Recent Labs  ?Lab 03/13/22 ?0926 03/14/22 ?0350 03/14/22 ?1140 03/15/22 ?0350  ?NA 137 137 136  --   ?K 3.6 3.3* 3.4*  --   ?CL 106 107 106  --   ?CO2 21* 25 22  --   ?GLUCOSE 85 108* 89  --   ?  BUN 12 9 7   --   ?CREATININE 1.06* 1.03* 0.96  --   ?CALCIUM 9.4 8.6* 8.8*  --   ?MG 1.6* 2.0  --  1.6*  ? ? ?GFR: ?Estimated Creatinine Clearance: 70.7 mL/min (by C-G formula based on SCr of 0.96 mg/dL). ?Liver Function Tests: ?Recent Labs  ?Lab 03/14/22 ?0350  ?AST 11*  ?ALT 8  ?ALKPHOS 50  ?BILITOT 0.6  ?PROT 5.9*  ?ALBUMIN 3.0*  ? ? ?No results for input(s): LIPASE, AMYLASE in the  last 168 hours. ?No results for input(s): AMMONIA in the last 168 hours. ?Coagulation Profile: ?No results for input(s): INR, PROTIME in the last 168 hours. ?Cardiac Enzymes: ?No results for input(s): CKTOTAL, CKMB, CKMBINDEX, TROPONINI in the last 168 hours. ?BNP (last 3 results) ?No results for input(s): PROBNP in the last 8760 hours. ?HbA1C: ?No results for input(s): HGBA1C in the last 72 hours. ?CBG: ?Recent Labs  ?Lab 03/14/22 ?1204 03/14/22 ?1730 03/14/22 ?2017 03/15/22 ?0015 03/15/22 ?0439  ?GLUCAP 92 138* 107* 107* 110*  ? ? ?Lipid Profile: ?No results for input(s): CHOL, HDL, LDLCALC, TRIG, CHOLHDL, LDLDIRECT in the last 72 hours. ?Thyroid Function Tests: ?No results for input(s): TSH, T4TOTAL, FREET4, T3FREE, THYROIDAB in the last 72 hours. ?Anemia Panel: ?No results for input(s): VITAMINB12, FOLATE, FERRITIN, TIBC, IRON, RETICCTPCT in the last 72 hours. ?Sepsis Labs: ?No results for input(s): PROCALCITON, LATICACIDVEN in the last 168 hours. ? ?No results found for this or any previous visit (from the past 240 hour(s)).  ? ? ? ? ? ?Radiology Studies: ?DG ESOPHAGUS W SINGLE CM (SOL OR THIN BA) ? ?Result Date: 03/13/2022 ?CLINICAL DATA:  51 year old with history of lung cancer status post radiation therapy with known radiation induced esophagitis and esophageal stricture. History of food impaction, status post removal of impaction and esophageal stent placement on 03/06/2022, who presented to emergency department today due to chest pain. Request for single-contrast esophagram for further evaluation. EXAM: ESOPHAGUS/BARIUM SWALLOW/TABLET STUDY TECHNIQUE: Single contrast examination was performed using water-soluble contrast. This exam was performed by Durenda Guthrie, PA-C, and was supervised and interpreted by Lorin Picket, MD. FLUOROSCOPY: Radiation Exposure Index (as provided by the fluoroscopic device): 3.20 mGy Kerma COMPARISON:  C-arm images during EGD on 03/06/2022, esophagram on 12/24/2021. FINDINGS: Scout  AP chest appears normal. Esophageal stent visualized at distal esophagus extending into the stomach. Swallowing: Not examined. Pharynx: Not examined. Esophagus: Moderate narrowing of the esophagus at the junction of the upper and middle thirds, as on recent esophagram. As noted above, the wall stent has migrated distally, flanking the gastroesophageal junction. Esophageal motility: Not examined. Hiatal Hernia: None. Gastroesophageal reflux: Not examined. Ingested 29mm barium tablet: Not given. Other: No contrast extravasation into pleural/mediastinal space noted. IMPRESSION: 1. Esophageal stent migration, currently at distal esophagus extending into the stomach. 2. Short segment moderate esophageal stricture at the junction of the upper and mid esophagus. 3.  No leak. Electronically Signed   By: Lorin Picket M.D.   On: 03/13/2022 13:09   ? ? ? ? ? ?Scheduled Meds: ? ondansetron (ZOFRAN) IV  4 mg Intravenous Once  ? pantoprazole (PROTONIX) IV  40 mg Intravenous Q12H  ? ?Continuous Infusions: ? dextrose 5 % and 0.9 % NaCl with KCl 20 mEq/L Stopped (03/15/22 7989)  ? magnesium sulfate bolus IVPB 50 mL/hr at 03/15/22 2119  ? ? ? ? ? ? ? ? ?Aline August, MD ?[facility] ?03/15/2022, 7:33 AM  ? ?

## 2022-03-15 NOTE — Op Note (Signed)
[facility] ?Patient Name: [name] ?Procedure Date: 03/15/2022 ?MRN: [id number] ?Attending MD: Ronnette Juniper , MD ?Date of Birth: 1971-01-04 ?CSN: [id number] ?Age: 51 ?Admit Type: Inpatient ?Procedure:                Upper GI endoscopy ?Indications:              For therapy of esophageal stenosis, Radiation  ?                          stricture, had an esophageal stent which was  ?                          removed when it migrated distally via EGD 2 days ago ?Providers:                Ronnette Juniper, MD, Carmie End, RN, Frazier Richards,  ?                          Technician ?Referring MD:             [facility] ?Medicines:                Monitored Anesthesia Care ?Complications:            No immediate complications. Estimated blood loss:  ?                          Minimal. ?Estimated Blood Loss:     Estimated blood loss was minimal. ?Procedure:                Pre-Anesthesia Assessment: ?                          - Prior to the procedure, a History and Physical  ?                          was performed, and patient medications and  ?                          allergies were reviewed. The patient's tolerance of  ?                          previous anesthesia was also reviewed. The risks  ?                          and benefits of the procedure and the sedation  ?                          options and risks were discussed with the patient.  ?                          All questions were answered, and informed consent  ?                          was obtained. Prior Anticoagulants: The patient has  ?  taken Eliquis (apixaban), last dose was 3 days  ?                          prior to procedure. ASA Grade Assessment: III - A  ?                          patient with severe systemic disease. After  ?                          reviewing the risks and benefits, the patient was  ?                          deemed in satisfactory condition to undergo the  ?                           procedure. ?                          After obtaining informed consent, the endoscope was  ?                          passed under direct vision. Throughout the  ?                          procedure, the patient's blood pressure, pulse, and  ?                          oxygen saturations were monitored continuously. The  ?                          GIF-[id number] ([id number]) Olympus endoscope was introduced  ?                          through the mouth, and advanced to the second part  ?                          of duodenum. The upper GI endoscopy was  ?                          accomplished without difficulty. The patient  ?                          tolerated the procedure well. ?Scope In: ?Scope Out: ?Findings: ?     One benign-appearing, intrinsic moderate (circumferential scarring or  ?     stenosis; an endoscope may pass) stenosis was found 25 cm from the  ?     incisors. The stenosis was traversed. A TTS dilator was passed through  ?     the scope. Dilation with a 15-16.5-18 mm x 8 cm CRE balloon dilator was  ?     performed to 15 mm and 16 mm. The dilation site was examined following  ?     endoscope reinsertion and showed moderate mucosal disruption and  ?     complete resolution of luminal narrowing. ?     Few superficial esophageal ulcers were  found. ?     A large amount of food (residue) was found in the gastric body. ?     The cardia and gastric fundus were normal on retroflexion. ?     Food (residue) was found in the duodenal bulb and in the first portion  ?     of the duodenum. ?Impression:               - Benign-appearing esophageal stenosis. Dilated. ?                          - Esophageal ulcers. ?                          - A large amount of food (residue) in the stomach. ?                          - Retained food in the duodenum. ?                          - No specimens collected. ?Moderate Sedation: ?     Patient did not receive moderate sedation for this procedure, but  ?     instead received monitored  anesthesia care. ?Recommendation:           - Full liquid diet. ?                          - Continue present medications. ?                          - PPI BID for 2 months and carafate 1 gm 4 times a  ?                          day for 2 weeks. ?                          - NO Eliquis for 3 days. ?                          - If full liquids tolerated today, ok to resume to  ?                          mechanically soft diet but no meat for tomorrow,  ?                          likely d/c home tomorrow. ?Procedure Code(s):        --- Professional --- ?                          [phone], Esophagogastroduodenoscopy, flexible,  ?                          transoral; with transendoscopic balloon dilation of  ?                          esophagus (less than 30 mm diameter) ?Diagnosis Code(s):        ---  Professional --- ?                          K22.2, Esophageal obstruction ?                          K22.10, Ulcer of esophagus without bleeding ?CPT copyright 2019 [employer]. All rights reserved. ?The codes documented in this report are preliminary and upon coder review may  ?be revised to meet current compliance requirements. ?Ronnette Juniper, MD ?03/15/2022 1:07:57 PM ?This report has been signed electronically. ?Number of Addenda: 0 ?

## 2022-03-15 NOTE — Interval H&P Note (Signed)
History and Physical Interval Note: ?50/female with radiation induced esophageal stricture for EGD with balloon dilation with propofol. ? ?03/15/2022 ?12:07 PM ? ?TAM SAVOIA  has presented today for Egd with balloon dilation, with the diagnosis of esophageal stricture, dysphagia.  The various methods of treatment have been discussed with the patient and family. After consideration of risks, benefits and other options for treatment, the patient has consented to  Procedure(s) with comments: ?ESOPHAGOGASTRODUODENOSCOPY (EGD) (N/A) - with dilation as a surgical intervention.  The patient's history has been reviewed, patient examined, no change in status, stable for surgery.  I have reviewed the patient's chart and labs.  Questions were answered to the patient's satisfaction.   ? ? ?Ronnette Juniper ? ? ?

## 2022-03-15 NOTE — Anesthesia Postprocedure Evaluation (Signed)
Anesthesia Post Note ? ?Patient: [name] ? ?Procedure(s) Performed: ESOPHAGOGASTRODUODENOSCOPY (EGD) ?BALLOON DILATION ? ?  ? ?Patient location during evaluation: PACU ?Anesthesia Type: MAC ?Level of consciousness: awake and alert ?Pain management: pain level controlled ?Vital Signs Assessment: post-procedure vital signs reviewed and stable ?Respiratory status: spontaneous breathing, nonlabored ventilation, respiratory function stable and patient connected to nasal cannula oxygen ?Cardiovascular status: stable and blood pressure returned to baseline ?Postop Assessment: no apparent nausea or vomiting ?Anesthetic complications: no ? ? ?No notable events documented. ? ?Last Vitals:  ?Vitals:  ? 03/15/22 1345 03/15/22 1421  ?BP: 106/60 104/70  ?Pulse: (!) 113 (!) 118  ?Resp: 15   ?Temp:  36.8 ?C  ?SpO2: 92% 94%  ?  ?Last Pain:  ?Vitals:  ? 03/15/22 1421  ?TempSrc: Oral  ?PainSc:   ? ? ?  ?  ?  ?  ?  ?  ? ?Suzette Battiest E ? ? ? ? ?

## 2022-03-16 LAB — BASIC METABOLIC PANEL
Anion gap: 4 — ABNORMAL LOW (ref 5–15)
BUN: 6 mg/dL (ref 6–20)
CO2: 25 mmol/L (ref 22–32)
Calcium: 8.5 mg/dL — ABNORMAL LOW (ref 8.9–10.3)
Chloride: 107 mmol/L (ref 98–111)
Creatinine, Ser: 0.68 mg/dL (ref 0.44–1.00)
GFR, Estimated: >60 mL/min (ref >60)
Glucose, Bld: 127 mg/dL — ABNORMAL HIGH (ref 70–99)
Potassium: 3.4 mmol/L — ABNORMAL LOW (ref 3.5–5.1)
Sodium: 136 mmol/L (ref 135–145)

## 2022-03-16 LAB — GLUCOSE, CAPILLARY
Glucose-Capillary: 127 mg/dL — ABNORMAL HIGH (ref 70–99)
Glucose-Capillary: 98 mg/dL (ref 70–99)
Glucose-Capillary: 127 mg/dL — ABNORMAL HIGH (ref 70–99)
Glucose-Capillary: 103 mg/dL — ABNORMAL HIGH (ref 70–99)
Glucose-Capillary: 120 mg/dL — ABNORMAL HIGH (ref 70–99)

## 2022-03-16 LAB — MAGNESIUM: Magnesium: 1.7 mg/dL (ref 1.7–2.4)

## 2022-03-16 MED ORDER — LIDOCAINE VISCOUS HCL 2 % MT SOLN
15.0000 mL | OROMUCOSAL | Status: DC | PRN
  Administered 2022-03-16: 15 mL via OROMUCOSAL
  Filled 2022-03-16 (×2): qty 15

## 2022-03-16 MED ORDER — APIXABAN 5 MG PO TABS: 5.0000 mg | ORAL_TABLET | Freq: Two times a day (BID) | ORAL | Status: DC

## 2022-03-16 MED ORDER — PROCHLORPERAZINE EDISYLATE 10 MG/2ML IJ SOLN
10.0000 mg | Freq: Once | INTRAMUSCULAR | Status: AC
Start: 2022-03-16 — End: 2022-03-16
  Administered 2022-03-16: 10 mg via INTRAVENOUS
  Filled 2022-03-16: qty 2

## 2022-03-16 MED ORDER — SODIUM CHLORIDE 0.9 % IV BOLUS
1000.0000 mL | Freq: Once | INTRAVENOUS | Status: AC
  Administered 2022-03-16: 1000 mL via INTRAVENOUS

## 2022-03-16 MED ORDER — SODIUM CHLORIDE 0.9 % IV BOLUS
500.0000 mL | Freq: Once | INTRAVENOUS | Status: AC
Start: 2022-03-16 — End: 2022-03-16
  Administered 2022-03-16: 500 mL via INTRAVENOUS

## 2022-03-16 MED ORDER — DIPHENHYDRAMINE HCL 50 MG/ML IJ SOLN
12.5000 mg | Freq: Once | INTRAMUSCULAR | Status: AC
Start: 2022-03-16 — End: 2022-03-16
  Administered 2022-03-16: 12.5 mg via INTRAVENOUS
  Filled 2022-03-16: qty 1

## 2022-03-16 MED ORDER — MORPHINE SULFATE (PF) 2 MG/ML IV SOLN: 0.5000 mg | Freq: Once | INTRAVENOUS | Status: DC

## 2022-03-16 MED ORDER — GUAIFENESIN-DM 100-10 MG/5ML PO SYRP
10.0000 mL | ORAL_SOLUTION | ORAL | Status: DC | PRN
  Administered 2022-03-16 – 2022-03-17 (×2): 10 mL via ORAL
  Filled 2022-03-16 (×2): qty 10

## 2022-03-16 NOTE — Progress Notes (Signed)
?   03/16/22 1337  ?Vitals  ?Temp 98 ?F (36.7 ?C)  ?Temp Source Oral  ?BP 92/79  ?MAP (mmHg) 85  ?BP Location Left Arm  ?BP Method Automatic  ?Pulse Rate (!) 111  ?Level of Consciousness  ?Level of Consciousness Alert  ?MEWS COLOR  ?MEWS Score Color Yellow  ?Oxygen Therapy  ?SpO2 94 %  ?O2 Device Nasal Cannula  ?O2 Flow Rate (L/min) 2 L/min  ?MEWS Score  ?MEWS Temp 0  ?MEWS Systolic 1  ?MEWS Pulse 2  ?MEWS RR 0  ?MEWS LOC 0  ?MEWS Score 3  ? ?VSs improved after 1000 ml bolus, pt states feeling some better. Also Xylocaine viscous given for throat pain, pt states some relief, and is sipping on liquids. Will continue to monitor. SRP,RN ?

## 2022-03-16 NOTE — Progress Notes (Addendum)
Patients blood pressure at 4am 85/57. PT is lethargic, responds to pain stimuli. Dr Tamala Julian made aware of blood pressure. NS 500cc bolus ordered and started. BP recheck of 97/64. Will continue to monitor. Discussed with Dr about adding compazine to allergies due to hypotension and lethargy. Dr agreed ? ?

## 2022-03-16 NOTE — Discharge Summary (Signed)
Physician Discharge Summary  ?[name]:[id number] DOB: Aug 09, 1971 DOA: 03/13/2022 ? ?PCP: [name], PA-C ? ?Admit date: 03/13/2022 ?Discharge date: 03/16/2022 ? ?Admitted From: Home ?Disposition: Home ? ?Recommendations for Outpatient Follow-up:  ?Follow up with PCP in 1 week  ?Outpatient follow-up with GI. ?Outpatient follow-up with oncology ?Follow up in ED if symptoms worsen or new appear ? ? ?Home Health: No ?Equipment/Devices: None ? ?Discharge Condition: Stable ?CODE STATUS: Full ?Diet recommendation: Dysphagia 3 diet/mechanical soft diet  ? ?Brief/Interim Summary: ?51 y.o. 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  Imaging showed esophageal stent migration, and distal esophagus extending into the stomach.  She underwent EGD and stent removal on 03/13/2022 by GI.  Subsequently diet was advanced but she could not tolerate it.  She underwent EGD and is a dilatation on 03/15/2022.  Subsequently she has tolerated diet.  She will be discharged home today with outpatient follow-up with PCP.  Outpatient follow-up with GI if needed. ? ?Discharge Diagnoses:  ? ?Esophageal stenosis/stricture with recent stent placement/radiation-induced esophagitis ?Dysphagia ?-Patient had recent esophageal stent placed on 03/06/2022.  Presented with precordial chest pain and was found to have migrated esophageal stent.  Status post EGD and stent removal on 03/13/2022. ?-Patient continued to have difficulty swallowing on solid diet on 03/14/2022 and was switched to full liquid diet.   ?-Currently on IV Protonix, oral sucralfate and IV fluids ?-She underwent EGD and is a dilatation on  03/15/2022.  Subsequently she has tolerated diet.  She will be discharged home today with outpatient follow-up with PCP.  Outpatient follow-up with GI if needed. ?-Currently on dysphagia 3 diet/mechanical soft diet.  Discharge diet as per GI recommendations. ?-Continue Protonix twice a day for 2 months and sucralfate 4 times a day for 2 weeks as per GI. ? ?Hypokalemia ?-Being replaced and IV fluids. ? ?Hypomagnesemia ?-Improved.. ? ?DVT of upper extremity ?-Eliquis on hold.  GI recommends to resume Eliquis 3 days after yesterday's procedure, from 03/18/2022 onwards  ? ?anemia of chronic disease ?Macrocytosis ?-Hemoglobin stable.  Outpatient follow-up. ? ?COPD ?-Respiratory status currently stable.  Continue as needed albuterol ?  ?History of lung cancer with radiation therapy ?-Outpatient follow-up with oncology ? ? ?Discharge Instructions ? ?Discharge Instructions   ? ? Diet - low sodium heart healthy   Complete by: As directed ?  ? Mechanical soft diet  ? Increase activity slowly   Complete by: As directed ?  ? ?  ? ?Allergies as of 03/16/2022   ? ?   Reactions  ? Compazine [prochlorperazine] Other (See Comments)  ? Hypotension and lethargy  ? Naproxen Nausea Only  ? Bactrim [sulfamethoxazole-trimethoprim] Nausea Only  ? ?  ? ?  ?Medication List  ?  ? ?TAKE these medications   ? ?apixaban 5 MG Tabs tablet ?Commonly known as: ELIQUIS ?Take 1 tablet (5 mg total) by mouth 2 (two) times daily. ?Start taking on: March 18, 2022 ?What changed: These instructions start on March 18, 2022. If you are unsure what to do until then, ask your doctor or other care provider. ?  ?diphenhydramine-acetaminophen 25-500 MG Tabs tablet ?Commonly known as: TYLENOL PM ?Take 3 tablets by mouth at bedtime as needed (for sleep). ?  ?folic acid 1  MG tablet ?Commonly known as: FOLVITE ?Take 1 tablet (1 mg total) by mouth daily. ?  ?metoprolol tartrate 25 MG tablet ?Commonly known as: LOPRESSOR ?Take 1 tablet (25 mg total) by mouth 2 (two) times  daily. ?  ?multivitamin Liqd ?Take 15 mLs by mouth daily. ?  ?nicotine 21 mg/24hr patch ?Commonly known as: NICODERM CQ - dosed in mg/24 hours ?Place 1 patch (21 mg total) onto the skin daily. ?  ?ondansetron 8 MG disintegrating tablet ?Commonly known as: ZOFRAN-ODT ?Dissolve 1 tablet (8 mg total) by mouth every 8 (eight) hours as needed for nausea or vomiting. ?  ?oxyCODONE 5 MG immediate release tablet ?Commonly known as: Oxy IR/ROXICODONE ?Take 1 tablet (5 mg total) by mouth every 6 (six) hours as needed for severe pain. ?  ?pantoprazole 40 MG tablet ?Commonly known as: PROTONIX ?Take 1 tablet (40 mg total) by mouth 2 (two) times daily. ?  ?Spiriva HandiHaler 18 MCG inhalation capsule ?Generic drug: tiotropium ?Place 1 capsule (18 mcg total) into inhaler and inhale daily. ?  ?sucralfate 1 GM/10ML suspension ?Commonly known as: CARAFATE ?Take 10 mLs (1 g total) by mouth 4 (four) times daily -  with meals and at bedtime. ?  ?Ventolin HFA 108 (90 Base) MCG/ACT inhaler ?Generic drug: albuterol ?Inhale 2 puffs into the lungs every 6 (six) hours as needed for wheezing or shortness of breath. ?  ? ?  ? ? Follow-up Information   ? ? Livengood, Marva Panda, PA-C. Schedule an appointment as soon as possible for a visit in 1 week(s).   ?Specialty: Physician Assistant ?Contact information: ?[id number] ?[place] [address] ?[phone] ? ? ?  ?  ? ?  ?  ? ?  ? ?Allergies  ?Allergen Reactions  ? Compazine [Prochlorperazine] Other (See Comments)  ?  Hypotension and lethargy  ? Naproxen Nausea Only  ? Bactrim [Sulfamethoxazole-Trimethoprim] Nausea Only  ? ? ?Consultations: ?GI ? ? ?Procedures/Studies: ?DG C-Arm 1-60 Min ? ?Result Date: 03/06/2022 ?CLINICAL DATA:  Esophageal stent placement EXAM: DG C-ARM 1-60 MIN CONTRAST:  None FLUOROSCOPY: Fluoroscopy Time:  1 minute 24 second Radiation Exposure Index (if provided by the fluoroscopic device): 13.56 mGy Number of Acquired Spot Images: 15 COMPARISON:  CT chest 01/07/2022  FINDINGS: Multiple C-arm images of the chest were obtained. A endoscope is seen in the esophagus. There is deployment of a stent in the mid esophagus. The stent appears to open appropriately. IMPRESSION: Stenting mid esophagus. Electronically Signed   By: Franchot Gallo M.D.   On: 03/06/2022 13:34  ? ?DG ESOPHAGUS W SINGLE CM (SOL OR THIN BA) ? ?Result Date: 03/13/2022 ?CLINICAL DATA:  51 year old with history of lung cancer status post radiation therapy with known radiation induced esophagitis and esophageal stricture. History of food impaction, status post removal of impaction and esophageal stent placement on 03/06/2022, who presented to emergency department today due to chest pain. Request for single-contrast esophagram for further evaluation. EXAM: ESOPHAGUS/BARIUM SWALLOW/TABLET STUDY TECHNIQUE: Single contrast examination was performed using water-soluble contrast. This exam was performed by Durenda Guthrie, PA-C, and was supervised and interpreted by Lorin Picket, MD. FLUOROSCOPY: Radiation Exposure Index (as provided by the fluoroscopic device): 3.20 mGy Kerma COMPARISON:  C-arm images during EGD on 03/06/2022, esophagram on 12/24/2021. FINDINGS: Scout AP chest appears normal. Esophageal stent visualized at distal esophagus extending into the stomach. Swallowing: Not examined. Pharynx: Not examined. Esophagus: Moderate narrowing of the esophagus at the junction of the upper and middle thirds, as on recent esophagram. As  noted above, the wall stent has migrated distally, flanking the gastroesophageal junction. Esophageal motility: Not examined. Hiatal Hernia: None. Gastroesophageal reflux: Not examined. Ingested 59mm barium tablet: Not given. Other: No contrast extravasation into pleural/mediastinal space noted. IMPRESSION: 1. Esophageal stent migration, currently at distal esophagus extending into the stomach. 2. Short segment moderate esophageal stricture at the junction of the upper and mid esophagus. 3.  No  leak. Electronically Signed   By: Lorin Picket M.D.   On: 03/13/2022 13:09   ? ?EGD and removal of esophageal stent on 03/13/2022 ?EGD and esophageal dilatation on 03/15/2022 ? ?Subjective: ?Patient seen a

## 2022-03-16 NOTE — Plan of Care (Signed)

## 2022-03-16 NOTE — Progress Notes (Signed)
?   03/16/22 1212  ?Vitals  ?Temp 99.1 ?F (37.3 ?C)  ?Temp Source Oral  ?BP (!) 82/48  ?MAP (mmHg) (!) 60  ?BP Location Left Arm  ?BP Method Automatic  ?Pulse Rate (!) 125  ?Pulse Rate Source Dinamap  ?Resp 14  ? ?MD updated....SRP, RN ?

## 2022-03-16 NOTE — Progress Notes (Signed)
Subjective: ?Patient states she does not feel well today, unable to tell me exactly what is wrong, states she feels "blah". ?She had potato soup and pudding yesterday evening and plans to eat breakfast potatoes and eggs for breakfast today. ?Denies chest pain. ?Has not had a bowel movement but is passing gas. ? ?Objective: ?Vital signs in last 24 hours: ?Temp:  [97.4 ?F (36.3 ?C)-99.1 ?F (37.3 ?C)] 98.3 ?F (36.8 ?C) (04/22 0830) ?Pulse Rate:  [105-133] 114 (04/22 0901) ?Resp:  [10-22] 18 (04/22 0830) ?BP: (83-108)/(55-75) 92/62 (04/22 0901) ?SpO2:  [86 %-95 %] 89 % (04/22 0901) ?Weight change:  ?  ? ?PE: Appears comfortable ?GENERAL: Slight pallor, no icterus  ?ABDOMEN: Soft, nondistended, nontender abdomen ?EXTREMITIES: No deformity, no edema ? ?Lab Results: ?Results for orders placed or performed during the hospital encounter of 03/13/22 (from the past 48 hour(s))  ?Basic metabolic panel     Status: Abnormal  ? Collection Time: 03/14/22 11:40 AM  ?Result Value Ref Range  ? Sodium 136 135 - 145 mmol/L  ? Potassium 3.4 (L) 3.5 - 5.1 mmol/L  ? Chloride 106 98 - 111 mmol/L  ? CO2 22 22 - 32 mmol/L  ? Glucose, Bld 89 70 - 99 mg/dL  ?  Comment: Glucose reference range applies only to samples taken after fasting for at least 8 hours.  ? BUN 7 6 - 20 mg/dL  ? Creatinine, Ser 0.96 0.44 - 1.00 mg/dL  ? Calcium 8.8 (L) 8.9 - 10.3 mg/dL  ? GFR, Estimated >60 >60 mL/min  ?  Comment: (NOTE) ?Calculated using the CKD-EPI Creatinine Equation (2021) ?  ? Anion gap 8 5 - 15  ?  Comment: Performed at [facility], [place] [address], [place], [place] [address]  ?Urinalysis, Routine w reflex microscopic Urine, Clean Catch     Status: Abnormal  ? Collection Time: 03/14/22 11:44 AM  ?Result Value Ref Range  ? Color, Urine YELLOW YELLOW  ? APPearance CLEAR CLEAR  ? Specific Gravity, Urine 1.008 1.005 - 1.030  ? pH 7.0 5.0 - 8.0  ? Glucose, UA NEGATIVE NEGATIVE mg/dL  ? Hgb urine dipstick MODERATE (A) NEGATIVE  ? Bilirubin  Urine NEGATIVE NEGATIVE  ? Ketones, ur NEGATIVE NEGATIVE mg/dL  ? Protein, ur NEGATIVE NEGATIVE mg/dL  ? Nitrite POSITIVE (A) NEGATIVE  ? Leukocytes,Ua NEGATIVE NEGATIVE  ? RBC / HPF 6-10 0 - 5 RBC/hpf  ? WBC, UA 0-5 0 - 5 WBC/hpf  ? Bacteria, UA MANY (A) NONE SEEN  ? Squamous Epithelial / LPF 0-5 0 - 5  ? Mucus PRESENT   ? Amorphous Crystal PRESENT   ?  Comment: Performed at [facility], [place] [address], [place],  [address]  ?Glucose, capillary     Status: None  ? Collection Time: 03/14/22 12:04 PM  ?Result Value Ref Range  ? Glucose-Capillary 92 70 - 99 mg/dL  ?  Comment: Glucose reference range applies only to samples taken after fasting for at least 8 hours.  ?Glucose, capillary     Status: Abnormal  ? Collection Time: 03/14/22  5:30 PM  ?Result Value Ref Range  ? Glucose-Capillary 138 (H) 70 - 99 mg/dL  ?  Comment: Glucose reference range applies only to samples taken after fasting for at least 8 hours.  ?Glucose, capillary     Status: Abnormal  ? Collection Time: 03/14/22  8:17 PM  ?Result Value Ref Range  ? Glucose-Capillary 107 (H) 70 - 99 mg/dL  ?  Comment: Glucose reference range applies only to samples taken after fasting for at least 8 hours.  ?Glucose, capillary     Status: Abnormal  ? Collection Time: 03/15/22 12:15 AM  ?Result Value Ref Range  ? Glucose-Capillary 107 (H) 70 - 99 mg/dL  ?  Comment: Glucose reference range applies only to samples taken after fasting for at least 8 hours.  ?Magnesium     Status: Abnormal  ? Collection Time: 03/15/22  3:50 AM  ?Result Value Ref Range  ? Magnesium 1.6 (L) 1.7 - 2.4 mg/dL  ?  Comment: Performed at [facility], [place] [address] [place], [place] [address]  ?Glucose, capillary     Status: Abnormal  ? Collection Time: 03/15/22  4:39 AM  ?Result Value Ref Range  ? Glucose-Capillary 110 (H) 70 - 99 mg/dL  ?  Comment: Glucose reference range applies only to samples taken after fasting for at least 8 hours.  ?Glucose,  capillary     Status: None  ? Collection Time: 03/15/22  7:42 AM  ?Result Value Ref Range  ? Glucose-Capillary 95 70 - 99 mg/dL  ?  Comment: Glucose reference range applies only to samples taken after fasting for at least 8 hours.  ?Glucose, capillary     Status: Abnormal  ? Collection Time: 03/15/22  4:15 PM  ?Result Value Ref Range  ? Glucose-Capillary 113 (H) 70 - 99 mg/dL  ?  Comment: Glucose reference range applies only to samples taken after fasting for at least 8 hours.  ?Glucose, capillary     Status: Abnormal  ? Collection Time: 03/15/22  7:51 PM  ?Result Value Ref Range  ? Glucose-Capillary 100 (H) 70 - 99 mg/dL  ?  Comment: Glucose reference range applies only to samples taken after fasting for at least 8 hours.  ?Glucose, capillary     Status: Abnormal  ? Collection Time: 03/15/22 11:55 PM  ?Result Value Ref Range  ? Glucose-Capillary 116 (H) 70 - 99 mg/dL  ?  Comment: Glucose reference range applies only to samples taken after fasting for at least 8 hours.  ?Basic metabolic panel     Status: Abnormal  ? Collection Time: 03/16/22  3:26 AM  ?Result Value Ref Range  ? Sodium 136 135 - 145 mmol/L  ? Potassium 3.4 (L) 3.5 - 5.1 mmol/L  ? Chloride 107 98 - 111 mmol/L  ? CO2 25 22 - 32 mmol/L  ? Glucose, Bld 127 (H) 70 - 99 mg/dL  ?  Comment: Glucose reference range applies only to samples taken after fasting for at least 8 hours.  ? BUN 6 6 - 20 mg/dL  ? Creatinine, Ser 0.68 0.44 - 1.00 mg/dL  ? Calcium 8.5 (L) 8.9 - 10.3 mg/dL  ? GFR, Estimated >60 >60 mL/min  ?  Comment: (NOTE) ?Calculated using the CKD-EPI Creatinine Equation (2021) ?  ? Anion gap 4 (L) 5 - 15  ?  Comment: Performed at [facility], [place] [address], [place], [place] [address]  ?Magnesium     Status: None  ? Collection Time: 03/16/22  3:26 AM  ?Result Value Ref Range  ? Magnesium 1.7 1.7 - 2.4 mg/dL  ?  Comment: Performed at [facility], [place] [address], [place], [place] [address]  ?Glucose, capillary      Status: Abnormal  ? Collection Time: 03/16/22  4:05 AM  ?Result Value Ref Range  ? Glucose-Capillary 127 (H) 70 - 99 mg/dL  ?  Comment:  Glucose reference range applies only to samples taken after fasting for at least 8 hours.  ?Glucose, capillary     Status: None  ? Collection Time: 03/16/22  7:41 AM  ?Result Value Ref Range  ? Glucose-Capillary 98 70 - 99 mg/dL  ?  Comment: Glucose reference range applies only to samples taken after fasting for at least 8 hours.  ? ? ?Studies/Results: ?No results found. ? ?Medications: I have reviewed the patient's current medications. ? ?Assessment: ?Radiation induced mid esophageal stricture ?Status post EGD and balloon dilation, 15 mm and 16.5 mm performed yesterday ? ?Plan: ?Okay to DC home today on mechanical soft diet. ?Advised patient to completely avoid chicken, [place], pork, hamburger, raw vegetables. ?Advised patient to focus on smoothies, soups, puddings, ice cream, peanut butter and mechanical soft diet. ?Advised patient that only form of meat that she should be eating is ground meat with sauce which is soft and not dry. ?Patient verbalizes understanding. ? ?Ronnette Juniper, MD ?03/16/2022, 9:51 AM ? ?  ?

## 2022-03-16 NOTE — Progress Notes (Signed)
?   03/16/22 0415  ?Assess: MEWS Score  ?Level of Consciousness Responds to Pain  ?Assess: MEWS Score  ?MEWS Temp 0  ?MEWS Systolic 1  ?MEWS Pulse 2  ?MEWS RR 0  ?MEWS LOC 2  ?MEWS Score 5  ?MEWS Score Color Red  ?Assess: if the MEWS score is Yellow or Red  ?Were vital signs taken at a resting state? Yes  ?Focused Assessment Change from prior assessment (see assessment flowsheet)  ?Does the patient meet 2 or more of the SIRS criteria? No  ?MEWS guidelines implemented *See Row Information* Yes  ?Treat  ?MEWS Interventions Administered scheduled meds/treatments;Escalated (See documentation below);Other (Comment) ?(Informed Dr Tamala Julian)  ?Nausea relieved by Antiemetic  ?Take Vital Signs  ?Increase Vital Sign Frequency  Red: Q 1hr X 4 then Q 4hr X 4, if remains red, continue Q 4hrs  ?Escalate  ?MEWS: Escalate Red: discuss with charge nurse/RN and provider, consider discussing with RRT  ?Notify: Charge Nurse/RN  ?Name of Charge Nurse/RN Notified Lenell Antu  ?Date Charge Nurse/RN Notified 03/16/22  ?Time Charge Nurse/RN Notified [phone]  ? ? ?

## 2022-03-17 LAB — GLUCOSE, CAPILLARY
Glucose-Capillary: 110 mg/dL — ABNORMAL HIGH (ref 70–99)
Glucose-Capillary: 119 mg/dL — ABNORMAL HIGH (ref 70–99)
Glucose-Capillary: 120 mg/dL — ABNORMAL HIGH (ref 70–99)

## 2022-03-17 NOTE — Plan of Care (Signed)

## 2022-03-17 NOTE — Progress Notes (Signed)
Pt discharged to home, instructions reviewed with patient. Acknowledged understanding of instructions. SRP< RN ?

## 2022-03-17 NOTE — Progress Notes (Signed)
Patient was supposed to be discharged on 03/16/2022 but discharge was held because of hypotension.  She was given IV fluids.  Subsequently, blood pressure has improved and she is tolerating diet.  She feels okay to go home today.  Feels much better.  Patient seen and examined at bedside and plan of care discussed with her.  Please refer to the full discharge summary done by me on 03/16/2022 for full details. ?

## 2022-03-18 ENCOUNTER — Encounter (HOSPITAL_COMMUNITY): Payer: Self-pay | Admitting: Gastroenterology

## 2022-03-18 ENCOUNTER — Telehealth: Payer: Self-pay | Admitting: Medical Oncology

## 2022-03-18 ENCOUNTER — Inpatient Hospital Stay: Payer: Medicaid Other | Admitting: Internal Medicine

## 2022-03-18 ENCOUNTER — Ambulatory Visit: Payer: Medicaid Other

## 2022-03-18 NOTE — Telephone Encounter (Signed)
Pt's mother notified pt that appt today is cancelled . Schedule message sent. I told pt to call radiology to r/s scan . She said she would call. ?

## 2022-03-19 ENCOUNTER — Ambulatory Visit: Payer: Medicaid Other

## 2022-03-19 ENCOUNTER — Telehealth: Payer: Self-pay | Admitting: Internal Medicine

## 2022-03-19 NOTE — Telephone Encounter (Signed)
.  Called patient to schedule appointment per 4/25 inbasket, patient is aware of date and time.   ?

## 2022-03-20 ENCOUNTER — Ambulatory Visit: Payer: Medicaid Other

## 2022-03-21 ENCOUNTER — Inpatient Hospital Stay: Payer: Medicaid Other

## 2022-03-21 DIAGNOSIS — G893 Neoplasm related pain (acute) (chronic): Secondary | ICD-10-CM | POA: Diagnosis not present

## 2022-03-21 DIAGNOSIS — C3490 Malignant neoplasm of unspecified part of unspecified bronchus or lung: Secondary | ICD-10-CM

## 2022-03-21 DIAGNOSIS — C3411 Malignant neoplasm of upper lobe, right bronchus or lung: Secondary | ICD-10-CM | POA: Diagnosis present

## 2022-03-21 LAB — CBC WITH DIFFERENTIAL (CANCER CENTER ONLY)
Abs Immature Granulocytes: 0.06 10*3/uL (ref 0.00–0.07)
Basophils Absolute: 0 10*3/uL (ref 0.0–0.1)
Basophils Relative: 0 %
Eosinophils Absolute: 0.1 10*3/uL (ref 0.0–0.5)
Eosinophils Relative: 1 %
HCT: 30.2 % — ABNORMAL LOW (ref 36.0–46.0)
Hemoglobin: 10 g/dL — ABNORMAL LOW (ref 12.0–15.0)
Immature Granulocytes: 1 %
Lymphocytes Relative: 14 %
Lymphs Abs: 1.1 10*3/uL (ref 0.7–4.0)
MCH: 34.4 pg — ABNORMAL HIGH (ref 26.0–34.0)
MCHC: 33.1 g/dL (ref 30.0–36.0)
MCV: 103.8 fL — ABNORMAL HIGH (ref 80.0–100.0)
Monocytes Absolute: 0.5 10*3/uL (ref 0.1–1.0)
Monocytes Relative: 6 %
Neutro Abs: 6.2 10*3/uL (ref 1.7–7.7)
Neutrophils Relative %: 78 %
Platelet Count: 273 10*3/uL (ref 150–400)
RBC: 2.91 MIL/uL — ABNORMAL LOW (ref 3.87–5.11)
RDW: 13.9 % (ref 11.5–15.5)
WBC Count: 8 10*3/uL (ref 4.0–10.5)
nRBC: 0 % (ref 0.0–0.2)

## 2022-03-21 LAB — CMP (CANCER CENTER ONLY)
ALT: 6 U/L (ref 0–44)
AST: 9 U/L — ABNORMAL LOW (ref 15–41)
Albumin: 3.6 g/dL (ref 3.5–5.0)
Alkaline Phosphatase: 57 U/L (ref 38–126)
Anion gap: 10 (ref 5–15)
BUN: 12 mg/dL (ref 6–20)
CO2: 25 mmol/L (ref 22–32)
Calcium: 9.5 mg/dL (ref 8.9–10.3)
Chloride: 105 mmol/L (ref 98–111)
Creatinine: 0.94 mg/dL (ref 0.44–1.00)
GFR, Estimated: >60 mL/min (ref >60)
Glucose, Bld: 103 mg/dL — ABNORMAL HIGH (ref 70–99)
Potassium: 3.4 mmol/L — ABNORMAL LOW (ref 3.5–5.1)
Sodium: 140 mmol/L (ref 135–145)
Total Bilirubin: 0.4 mg/dL (ref 0.3–1.2)
Total Protein: 7 g/dL (ref 6.5–8.1)

## 2022-03-21 LAB — SAMPLE TO BLOOD BANK

## 2022-03-21 LAB — MAGNESIUM: Magnesium: 1.6 mg/dL — ABNORMAL LOW (ref 1.7–2.4)

## 2022-03-25 ENCOUNTER — Ambulatory Visit (HOSPITAL_COMMUNITY)
Admission: RE | Admit: 2022-03-25 | Discharge: 2022-03-25 | Disposition: A | Discharge status: Home / Self Care | Payer: Medicaid Other | Source: Ambulatory Visit | Attending: Internal Medicine | Admitting: Internal Medicine

## 2022-03-25 DIAGNOSIS — C3490 Malignant neoplasm of unspecified part of unspecified bronchus or lung: Secondary | ICD-10-CM | POA: Diagnosis present

## 2022-03-25 MED ORDER — IOHEXOL 9 MG/ML PO SOLN
1000.0000 mL | ORAL | Status: AC
  Administered 2022-03-25: 1000 mL via ORAL

## 2022-03-25 MED ORDER — IOHEXOL 9 MG/ML PO SOLN
ORAL | Status: AC
  Filled 2022-03-25: qty 1000

## 2022-03-25 MED ORDER — IOHEXOL 300 MG/ML  SOLN
75.0000 mL | Freq: Once | INTRAMUSCULAR | Status: AC | PRN
  Administered 2022-03-25: 75 mL via INTRAVENOUS

## 2022-03-25 MED ORDER — SODIUM CHLORIDE (PF) 0.9 % IJ SOLN
INTRAMUSCULAR | Status: AC
  Filled 2022-03-25: qty 50

## 2022-04-02 ENCOUNTER — Inpatient Hospital Stay: Payer: Medicaid Other | Attending: Radiation Oncology | Admitting: Internal Medicine

## 2022-04-02 ENCOUNTER — Other Ambulatory Visit: Payer: Self-pay

## 2022-04-02 ENCOUNTER — Telehealth: Payer: Self-pay | Admitting: Medical Oncology

## 2022-04-02 VITALS — BP 102/79 | HR 134 | Temp 97.7°F | Resp 17 | Wt 129.0 lb

## 2022-04-02 DIAGNOSIS — Z888 Allergy status to other drugs, medicaments and biological substances status: Secondary | ICD-10-CM | POA: Insufficient documentation

## 2022-04-02 DIAGNOSIS — Z5111 Encounter for antineoplastic chemotherapy: Secondary | ICD-10-CM | POA: Diagnosis not present

## 2022-04-02 DIAGNOSIS — D61818 Other pancytopenia: Secondary | ICD-10-CM | POA: Insufficient documentation

## 2022-04-02 DIAGNOSIS — Z9049 Acquired absence of other specified parts of digestive tract: Secondary | ICD-10-CM | POA: Insufficient documentation

## 2022-04-02 DIAGNOSIS — R000 Tachycardia, unspecified: Secondary | ICD-10-CM | POA: Insufficient documentation

## 2022-04-02 DIAGNOSIS — K222 Esophageal obstruction: Secondary | ICD-10-CM | POA: Insufficient documentation

## 2022-04-02 DIAGNOSIS — Z881 Allergy status to other antibiotic agents status: Secondary | ICD-10-CM | POA: Insufficient documentation

## 2022-04-02 DIAGNOSIS — I700 Atherosclerosis of aorta: Secondary | ICD-10-CM | POA: Diagnosis not present

## 2022-04-02 DIAGNOSIS — T85528A Displacement of other gastrointestinal prosthetic devices, implants and grafts, initial encounter: Secondary | ICD-10-CM | POA: Diagnosis not present

## 2022-04-02 DIAGNOSIS — Z9221 Personal history of antineoplastic chemotherapy: Secondary | ICD-10-CM | POA: Diagnosis not present

## 2022-04-02 DIAGNOSIS — Z923 Personal history of irradiation: Secondary | ICD-10-CM | POA: Diagnosis not present

## 2022-04-02 DIAGNOSIS — Z7901 Long term (current) use of anticoagulants: Secondary | ICD-10-CM | POA: Diagnosis not present

## 2022-04-02 DIAGNOSIS — J432 Centrilobular emphysema: Secondary | ICD-10-CM | POA: Diagnosis not present

## 2022-04-02 DIAGNOSIS — Z886 Allergy status to analgesic agent status: Secondary | ICD-10-CM | POA: Insufficient documentation

## 2022-04-02 DIAGNOSIS — C3490 Malignant neoplasm of unspecified part of unspecified bronchus or lung: Secondary | ICD-10-CM | POA: Diagnosis not present

## 2022-04-02 DIAGNOSIS — Z79899 Other long term (current) drug therapy: Secondary | ICD-10-CM | POA: Insufficient documentation

## 2022-04-02 DIAGNOSIS — C3411 Malignant neoplasm of upper lobe, right bronchus or lung: Secondary | ICD-10-CM | POA: Insufficient documentation

## 2022-04-02 DIAGNOSIS — K2100 Gastro-esophageal reflux disease with esophagitis, without bleeding: Secondary | ICD-10-CM | POA: Diagnosis not present

## 2022-04-02 DIAGNOSIS — T451X5A Adverse effect of antineoplastic and immunosuppressive drugs, initial encounter: Secondary | ICD-10-CM

## 2022-04-02 NOTE — Telephone Encounter (Signed)
F/U EKG and referral to [facility].  ? ?[name]  was instructed to go to Urgent care today regarding her tachycardia and abnormal EKG. Pt said she would go today . I called her back on her mothers phone and [name] said she did not feel like going to Urrgent care today ,but she would go tomorrow .  ? ? ?After speaking to [facility] staff , I called [name] back and told her she will be getting a call from the cardiology office for an appt.  ? ?I told [name] to check her phone because I  ?have not been able to get her on the phone.  ?She said she will "reboot " her phone. ? ?I also instructed [name] to go to ED if she has chest pain , SOB, Dyspnea , N/V, jaw pain. ?She voiced understanding. ?

## 2022-04-02 NOTE — Telephone Encounter (Signed)
Called Dr. Harrell Lark office regarding abnormal EKG . Record faxed.  ?

## 2022-04-02 NOTE — Progress Notes (Signed)
?    [facility] ?[phone]   [phone] ? ?OFFICE PROGRESS NOTE ? ?[name], PA-C ?[address] ?[place] [address] ? ?DIAGNOSIS: Limited stage (T3, N2, M0) small cell lung cancer presented with right hilar/suprahilar mass with occlusion of the right upper lobe bronchus in addition to right paratracheal, right infra hilar and subcarinal lymphadenopathy as well as additional right upper lobe and right apical pulmonary nodules.  This was diagnosed in October 2022. ? ?PRIOR THERAPY: Systemic chemotherapy with cisplatin 80 Mg/M2 on day 1 and etoposide 100 Mg/M2 on days 1, 2 and 3 every 3 weeks.  First dose October 02, 2021.  This will be concurrent with radiotherapy under the care of Dr. Sondra Come.  Status post 4 cycles.  Last cycle of chemotherapy was given on December 04, 2021 with almost complete response. ?She declined prophylactic cranial irradiation. ? ?CURRENT THERAPY: Observation. ? ?INTERVAL HISTORY: ?[name] 51 y.o. female returns to the clinic today for follow-up visit accompanied by her mother.  The patient is feeling fine today with no concerning complaints except for mild fatigue.  She declined prophylactic cranial irradiation.  She continues to have tachycardia and she was referred to cardiology in the past but never received an appointment from them.  She denied having any current chest pain, shortness of breath, cough or hemoptysis.  She has no nausea, vomiting, diarrhea or constipation.  She has no headache or visual changes.  She lost more than 10 pounds in the last few weeks but she eats good.  She is here today for evaluation with repeat CT scan of the chest, abdomen and pelvis for restaging of her disease. ? ?MEDICAL HISTORY: ?Past Medical History:  ?Diagnosis Date  ? Ankle sprain   ? left  ? Anxiety   ? treated for panic attacks in the past.  ? Asthma   ? COPD (chronic obstructive pulmonary disease) ([place])   ? Depression   ? History of  radiation therapy   ? Right lung- 09/26/21-11/13/21- Dr. Gery Pray  ? Irregular heart rate   ? Lung cancer ([place])   ? Renal disorder   ? kidney infections  ? ? ?ALLERGIES:  is allergic to compazine [prochlorperazine], naproxen, and bactrim [sulfamethoxazole-trimethoprim]. ? ?MEDICATIONS:  ?Current Outpatient Medications  ?Medication Sig Dispense Refill  ? albuterol (VENTOLIN HFA) 108 (90 Base) MCG/ACT inhaler Inhale 2 puffs into the lungs every 6 (six) hours as needed for wheezing or shortness of breath. 18 g 2  ? apixaban (ELIQUIS) 5 MG TABS tablet Take 1 tablet (5 mg total) by mouth 2 (two) times daily.    ? diphenhydramine-acetaminophen (TYLENOL PM) 25-500 MG TABS tablet Take 3 tablets by mouth at bedtime as needed (for sleep).    ? folic acid (FOLVITE) 1 MG tablet Take 1 tablet (1 mg total) by mouth daily. (Patient not taking: Reported on 03/03/2022) 90 tablet 1  ? Multiple Vitamin (MULTIVITAMIN) LIQD Take 15 mLs by mouth daily. (Patient not taking: Reported on 03/03/2022) 178 mL 1  ? nicotine (NICODERM CQ - DOSED IN MG/24 HOURS) 21 mg/24hr patch Place 1 patch (21 mg total) onto the skin daily. 28 patch 0  ? ondansetron (ZOFRAN-ODT) 8 MG disintegrating tablet Dissolve 1 tablet (8 mg total) by mouth every 8 (eight) hours as needed for nausea or vomiting. 30 tablet 0  ? oxyCODONE (OXY IR/ROXICODONE) 5 MG immediate release tablet Take 1 tablet (5 mg total) by mouth every 6 (six) hours  as needed for severe pain. 20 tablet 0  ? pantoprazole (PROTONIX) 40 MG tablet Take 1 tablet (40 mg total) by mouth 2 (two) times daily. (Patient not taking: Reported on 03/13/2022) 60 tablet 2  ? sucralfate (CARAFATE) 1 GM/10ML suspension Take 10 mLs (1 g total) by mouth 4 (four) times daily -  with meals and at bedtime. 420 mL 0  ? tiotropium (SPIRIVA HANDIHALER) 18 MCG inhalation capsule Place 1 capsule (18 mcg total) into inhaler and inhale daily. (Patient not taking: Reported on 03/03/2022) 30 capsule 2  ? ?No current  facility-administered medications for this visit.  ? ? ?SURGICAL HISTORY:  ?Past Surgical History:  ?Procedure Laterality Date  ? APPENDECTOMY    ? BALLOON DILATION N/A 03/15/2022  ? Procedure: BALLOON DILATION;  Surgeon: Ronnette Juniper, MD;  Location: [facility];  Service: Gastroenterology;  Laterality: N/A;  ? BRONCHIAL BIOPSY  09/21/2021  ? Procedure: BRONCHIAL BIOPSIES;  Surgeon: Collene Gobble, MD;  Location: [facility] ENDOSCOPY;  Service: Pulmonary;;  ? BRONCHIAL BRUSHINGS  09/21/2021  ? Procedure: BRONCHIAL BRUSHINGS;  Surgeon: Collene Gobble, MD;  Location: [facility] ENDOSCOPY;  Service: Pulmonary;;  ? BRONCHIAL NEEDLE ASPIRATION BIOPSY  09/21/2021  ? Procedure: BRONCHIAL NEEDLE ASPIRATION BIOPSIES;  Surgeon: Collene Gobble, MD;  Location: [facility] ENDOSCOPY;  Service: Pulmonary;;  ? ESOPHAGEAL STENT PLACEMENT N/A 03/06/2022  ? Procedure: ESOPHAGEAL STENT PLACEMENT;  Surgeon: Clarene Essex, MD;  Location: [facility] ENDOSCOPY;  Service: Gastroenterology;  Laterality: N/A;  ? ESOPHAGOGASTRODUODENOSCOPY N/A 12/26/2021  ? Procedure: ESOPHAGOGASTRODUODENOSCOPY (EGD);  Surgeon: Clarene Essex, MD;  Location: [facility] ENDOSCOPY;  Service: Endoscopy;  Laterality: N/A;  ? ESOPHAGOGASTRODUODENOSCOPY N/A 02/11/2022  ? Procedure: ESOPHAGOGASTRODUODENOSCOPY (EGD);  Surgeon: Arta Silence, MD;  Location: [facility] ENDOSCOPY;  Service: Gastroenterology;  Laterality: N/A;  ? ESOPHAGOGASTRODUODENOSCOPY N/A 03/03/2022  ? Procedure: ESOPHAGOGASTRODUODENOSCOPY (EGD);  Surgeon: Wilford Corner, MD;  Location: [facility] ENDOSCOPY;  Service: Gastroenterology;  Laterality: N/A;  ? ESOPHAGOGASTRODUODENOSCOPY N/A 03/06/2022  ? Procedure: ESOPHAGOGASTRODUODENOSCOPY (EGD);  Surgeon: Clarene Essex, MD;  Location: [facility] ENDOSCOPY;  Service: Gastroenterology;  Laterality: N/A;  With esophageal stent placement  ? ESOPHAGOGASTRODUODENOSCOPY N/A 03/15/2022  ? Procedure: ESOPHAGOGASTRODUODENOSCOPY (EGD);  Surgeon: Ronnette Juniper, MD;  Location: [facility] ENDOSCOPY;  Service: Gastroenterology;  Laterality: N/A;   with dilation  ? ESOPHAGOGASTRODUODENOSCOPY (EGD) WITH PROPOFOL N/A 01/07/2022  ? Procedure: ESOPHAGOGASTRODUODENOSCOPY (EGD) WITH PROPOFOL;  Surgeon: Otis Brace, MD;  Location: [facility] ENDOSCOPY;  Service: Gastroenterology;  Laterality: N/A;  ? ESOPHAGOGASTRODUODENOSCOPY (EGD) WITH PROPOFOL N/A 02/02/2022  ? Procedure: ESOPHAGOGASTRODUODENOSCOPY (EGD) WITH PROPOFOL;  Surgeon: Ronnette Juniper, MD;  Location: [facility] ENDOSCOPY;  Service: Gastroenterology;  Laterality: N/A;  ? ESOPHAGOGASTRODUODENOSCOPY (EGD) WITH PROPOFOL N/A 02/04/2022  ? Procedure: ESOPHAGOGASTRODUODENOSCOPY (EGD) WITH PROPOFOL;  Surgeon: Otis Brace, MD;  Location: [facility] ENDOSCOPY;  Service: Gastroenterology;  Laterality: N/A;  ? ESOPHAGOGASTRODUODENOSCOPY (EGD) WITH PROPOFOL N/A 03/13/2022  ? Procedure: ESOPHAGOGASTRODUODENOSCOPY (EGD) WITH PROPOFOL;  Surgeon: Clarene Essex, MD;  Location: [facility] ENDOSCOPY;  Service: Gastroenterology;  Laterality: N/A;  ? FOREIGN BODY REMOVAL  12/26/2021  ? Procedure: FOREIGN BODY REMOVAL;  Surgeon: Clarene Essex, MD;  Location: [facility] ENDOSCOPY;  Service: Endoscopy;;  ? FOREIGN BODY REMOVAL  02/02/2022  ? Procedure: FOREIGN BODY REMOVAL;  Surgeon: Ronnette Juniper, MD;  Location: [facility] ENDOSCOPY;  Service: Gastroenterology;;  ? FOREIGN BODY REMOVAL  02/11/2022  ? Procedure: FOREIGN BODY REMOVAL;  Surgeon: Arta Silence, MD;  Location: [facility] ENDOSCOPY;  Service: Gastroenterology;;  ? FOREIGN BODY REMOVAL N/A 03/03/2022  ? Procedure: FOREIGN BODY REMOVAL;  Surgeon: Wilford Corner, MD;  Location: [facility] ENDOSCOPY;  Service: Gastroenterology;  Laterality: N/A;  ? IR CHEST FLUORO  12/27/2021  ? LUMBAR LAMINECTOMY N/A 05/13/2018  ? Procedure: LEFT LUMBAR FOUR-FIVE MICRODISCECTOMY;  Surgeon: Marybelle Killings, MD;  Location: [facility];  Service: Orthopedics;  Laterality: N/A;  ? STENT REMOVAL  03/13/2022  ? Procedure: STENT REMOVAL;  Surgeon: Clarene Essex, MD;  Location: [facility] ENDOSCOPY;  Service: Gastroenterology;;  ? TUBAL LIGATION    ? TUBAL LIGATION  1998  ? VIDEO  BRONCHOSCOPY WITH ENDOBRONCHIAL ULTRASOUND N/A 09/21/2021  ? Procedure: VIDEO BRONCHOSCOPY WITH ENDOBRONCHIAL ULTRASOUND;  Surgeon: Collene Gobble, MD;  Location: [facility] ENDOSCOPY;  Service: Pulmonary;  Laterality: N/A;

## 2022-04-04 ENCOUNTER — Ambulatory Visit (INDEPENDENT_AMBULATORY_CARE_PROVIDER_SITE_OTHER): Payer: Medicaid Other | Admitting: Cardiovascular Disease

## 2022-04-04 ENCOUNTER — Encounter: Payer: Self-pay | Admitting: Cardiovascular Disease

## 2022-04-04 VITALS — BP 105/83 | HR 119 | Ht 68.0 in | Wt 129.4 lb

## 2022-04-04 DIAGNOSIS — R7989 Other specified abnormal findings of blood chemistry: Secondary | ICD-10-CM

## 2022-04-04 DIAGNOSIS — J432 Centrilobular emphysema: Secondary | ICD-10-CM | POA: Diagnosis not present

## 2022-04-04 DIAGNOSIS — R000 Tachycardia, unspecified: Secondary | ICD-10-CM

## 2022-04-04 DIAGNOSIS — I82621 Acute embolism and thrombosis of deep veins of right upper extremity: Secondary | ICD-10-CM

## 2022-04-04 DIAGNOSIS — C3491 Malignant neoplasm of unspecified part of right bronchus or lung: Secondary | ICD-10-CM | POA: Diagnosis not present

## 2022-04-04 DIAGNOSIS — G629 Polyneuropathy, unspecified: Secondary | ICD-10-CM

## 2022-04-04 NOTE — Progress Notes (Signed)
?Cardiology Office Note:   ? ?Date:  04/05/2022  ? ?ID:  [name], DOB 03-07-1971, MRN [id number] ? ?PCP:  [name], PA-C ?  ?[facility] Providers ?Cardiologist:  None    ? ?Referring MD: Darra Lis*  ? ?Chief Complaint  ?Patient presents with  ? Consult  ?[name] is a 51 y.o. female who is being seen today for the evaluation of tachycardia and abnormal ECG at the request of [name], P*. ? ? ?History of Present Illness:   ? ?[name] is a 51 y.o. female with a hx of small cell carcinoma of the right lung previously treated with chest radiation therapy and chemotherapy (etoposide-cisplatinum), COPD.  She is referred for sinus tachycardia and ECG abnormalities. ? ?She has no cardiovascular complaints.  She is rarely aware of palpitations, only sometimes when she is lying in bed.  She denies dizziness or near syncope or syncope.  She states that she has normal eating habits and drinks plenty of fluids, has normal urinary output.  She denies shortness of breath or chest pain either at rest or with activity.  She's lost about 60 pounds of weight during her treatment for cancer.  She has not had any bleeding problems.  She has a chronic cough.  She has prominent neuropathic symptoms in fingers and feet (numbness, tingling, paresthesias). ? ?She last received chemotherapy in January 2023 with "almost complete response". ? ?Her electrocardiogram from 04/02/2022 shows sinus tachycardia.  There is possible biatrial enlargement, but all voltages appear to be exaggerated due to her very slender body habitus.  There are minor nonspecific ST segment changes.  QTc normal at 449 ms.  Very similar findings are seen on the ECG performed today. ? ?She has had about a dozen electrocardiograms performed the last 6 months.  All of these show sinus tachycardia.  Interestingly electrocardiograms as far back as 2014 showed sinus tachycardia with rates in the 102-137 bpm range.  Only  one EKG in 2016 shows normal sinus rhythm at 75 bpm. ? ?Her echocardiogram performed in January of this year showed normal findings (normal systolic and diastolic left ventricular function, no significant valvular abnormalities, normal atrial sizes, no pericardial effusion).  At the time that echocardiogram was performed she was also tachycardic with a heart rate of 117 bpm.  The echocardiogram was performed for an elevated BNP level (389-373), recorded in January after receiving radiation therapy and chemotherapy and was being treated for aspiration pneumonia with acute hypoxic respiratory failure. ? ?She had a very recent chest CT performed on 03/25/2022 did not show any evidence of pericardial effusion and showed reduction in size of the primary right lung nodule and associated lymphadenopathy following therapy.  Aortic atherosclerosis was present.  She is mildly anemic with a hemoglobin of 10.0 with macrocytic indices.  Routine chemistries performed in the last couple of weeks show mild hypokalemia and mild hypomagnesemia with normal renal function and normal liver function tests. ? 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  However the esophageal stent migrated distally and was subsequently removed.  She has no difficulty swallowing right now. ? ?She is on anticoagulation with apixaban after having a PICC line associated DVT of the right upper extremity in January 2023 (per notes, plan was for a 44-month course of anticoagulation). ? ?Past Medical History:  ?Diagnosis Date  ? Ankle sprain   ? left  ? Anxiety   ?  treated for panic attacks in the past.  ? Asthma   ? COPD (chronic obstructive pulmonary disease) ([place])   ? Depression   ? History of radiation therapy   ? Right lung- 09/26/21-11/13/21- Dr. Gery Pray  ? Irregular heart rate   ? Lung cancer ([place])   ? Renal disorder   ? kidney infections  ? ? ?Past Surgical History:  ?Procedure  Laterality Date  ? APPENDECTOMY    ? BALLOON DILATION N/A 03/15/2022  ? Procedure: BALLOON DILATION;  Surgeon: Ronnette Juniper, MD;  Location: [facility];  Service: Gastroenterology;  Laterality: N/A;  ? BRONCHIAL BIOPSY  09/21/2021  ? Procedure: BRONCHIAL BIOPSIES;  Surgeon: Collene Gobble, MD;  Location: [facility] ENDOSCOPY;  Service: Pulmonary;;  ? BRONCHIAL BRUSHINGS  09/21/2021  ? Procedure: BRONCHIAL BRUSHINGS;  Surgeon: Collene Gobble, MD;  Location: [facility] ENDOSCOPY;  Service: Pulmonary;;  ? BRONCHIAL NEEDLE ASPIRATION BIOPSY  09/21/2021  ? Procedure: BRONCHIAL NEEDLE ASPIRATION BIOPSIES;  Surgeon: Collene Gobble, MD;  Location: [facility] ENDOSCOPY;  Service: Pulmonary;;  ? ESOPHAGEAL STENT PLACEMENT N/A 03/06/2022  ? Procedure: ESOPHAGEAL STENT PLACEMENT;  Surgeon: Clarene Essex, MD;  Location: [facility];  Service: Gastroenterology;  Laterality: N/A;  ? ESOPHAGOGASTRODUODENOSCOPY N/A 12/26/2021  ? Procedure: ESOPHAGOGASTRODUODENOSCOPY (EGD);  Surgeon: Clarene Essex, MD;  Location: [facility];  Service: Endoscopy;  Laterality: N/A;  ? ESOPHAGOGASTRODUODENOSCOPY N/A 02/11/2022  ? Procedure: ESOPHAGOGASTRODUODENOSCOPY (EGD);  Surgeon: Arta Silence, MD;  Location: [facility] ENDOSCOPY;  Service: Gastroenterology;  Laterality: N/A;  ? ESOPHAGOGASTRODUODENOSCOPY N/A 03/03/2022  ? Procedure: ESOPHAGOGASTRODUODENOSCOPY (EGD);  Surgeon: Wilford Corner, MD;  Location: [facility] ENDOSCOPY;  Service: Gastroenterology;  Laterality: N/A;  ? ESOPHAGOGASTRODUODENOSCOPY N/A 03/06/2022  ? Procedure: ESOPHAGOGASTRODUODENOSCOPY (EGD);  Surgeon: Clarene Essex, MD;  Location: [facility] END[facility];  Service: Gastroenterology;  Laterality: N/A;  With esophageal stent placement  ? ESOPHAGOGASTRODUODENOSCOPY N/A 03/15/2022  ? Procedure: ESOPHAGOGASTRODUODENOSCOPY (EGD);  Surgeon: Ronnette Juniper, MD;  Location: [facility] END[facility];  Service: Gastroenterology;  Laterality: N/A;  with dilation  ? ESOPHAGOGASTRODUODENOSCOPY (EGD) WITH PROPOFOL N/A 01/07/2022  ? Procedure:  ESOPHAGOGASTRODUODENOSCOPY (EGD) WITH PROPOFOL;  Surgeon: Otis Brace, MD;  Location: [facility] ENDOSCOPY;  Service: Gastroenterology;  Laterality: N/A;  ? ESOPHAGOGASTRODUODENOSCOPY (EGD) WITH PROPOFOL N/A 02/02/2022  ? Procedure: ESOPHAGOGASTRODUODENOSCOPY (EGD) WITH PROPOFOL;  Surgeon: Ronnette Juniper, MD;  Location: [facility] ENDOSCOPY;  Service: Gastroenterology;  Laterality: N/A;  ? ESOPHAGOGASTRODUODENOSCOPY (EGD) WITH PROPOFOL N/A 02/04/2022  ? Procedure: ESOPHAGOGASTRODUODENOSCOPY (EGD) WITH PROPOFOL;  Surgeon: Otis Brace, MD;  Location: [facility] ENDOSCOPY;  Service: Gastroenterology;  Laterality: N/A;  ? ESOPHAGOGASTRODUODENOSCOPY (EGD) WITH PROPOFOL N/A 03/13/2022  ? Procedure: ESOPHAGOGASTRODUODENOSCOPY (EGD) WITH PROPOFOL;  Surgeon: Clarene Essex, MD;  Location: [facility] END[facility];  Service: Gastroenterology;  Laterality: N/A;  ? FOREIGN BODY REMOVAL  12/26/2021  ? Procedure: FOREIGN BODY REMOVAL;  Surgeon: Clarene Essex, MD;  Location: [facility] ENDOSCOPY;  Service: Endoscopy;;  ? FOREIGN BODY REMOVAL  02/02/2022  ? Procedure: FOREIGN BODY REMOVAL;  Surgeon: Ronnette Juniper, MD;  Location: [facility] ENDOSCOPY;  Service: Gastroenterology;;  ? FOREIGN BODY REMOVAL  02/11/2022  ? Procedure: FOREIGN BODY REMOVAL;  Surgeon: Arta Silence, MD;  Location: [facility] ENDOSCOPY;  Service: Gastroenterology;;  ? FOREIGN BODY REMOVAL N/A 03/03/2022  ? Procedure: FOREIGN BODY REMOVAL;  Surgeon: Wilford Corner, MD;  Location: [facility] ENDOSCOPY;  Service: Gastroenterology;  Laterality: N/A;  ? IR CHEST FLUORO  12/27/2021  ? LUMBAR LAMINECTOMY N/A 05/13/2018  ? Procedure: LEFT LUMBAR FOUR-FIVE MICRODISCECTOMY;  Surgeon: Marybelle Killings, MD;  Location: [place];  Service: Orthopedics;  Laterality: N/A;  ?  STENT REMOVAL  03/13/2022  ? Procedure: STENT REMOVAL;  Surgeon: Clarene Essex, MD;  Location: [facility] END[facility];  Service: Gastroenterology;;  ? TUBAL LIGATION    ? TUBAL LIGATION  1998  ? VIDEO BRONCHOSCOPY WITH ENDOBRONCHIAL ULTRASOUND N/A 09/21/2021  ? Procedure: VIDEO BRONCHOSCOPY  WITH ENDOBRONCHIAL ULTRASOUND;  Surgeon: Collene Gobble, MD;  Location: [facility] ENDOSCOPY;  Service: Pulmonary;  Laterality: N/A;  ? ? ?Current Medications: ?Current Meds  ?Medication Sig  ? albuterol (VENTOLIN HFA) 108 (90

## 2022-04-04 NOTE — Patient Instructions (Signed)
Medication Instructions:  ?No changes ?*If you need a refill on your cardiac medications before your next appointment, please call your pharmacy* ? ? ?Lab Work: ?None ordered ?If you have labs (blood work) drawn today and your tests are completely normal, you will receive your results only by: ?MyChart Message (if you have MyChart) OR ?A paper copy in the mail ?If you have any lab test that is abnormal or we need to change your treatment, we will call you to review the results. ? ? ?Testing/Procedures: ?None ordered ? ? ?Follow-Up: ?At [facility], you and your health needs are our priority.  As part of our continuing mission to provide you with exceptional heart care, we have created designated Provider Care Teams.  These Care Teams include your primary Cardiologist (physician) and Advanced Practice Providers (APPs -  Physician Assistants and Nurse Practitioners) who all work together to provide you with the care you need, when you need it. ? ?We recommend signing up for the patient portal called "MyChart".  Sign up information is provided on this After Visit Summary.  MyChart is used to connect with patients for Virtual Visits (Telemedicine).  Patients are able to view lab/test results, encounter notes, upcoming appointments, etc.  Non-urgent messages can be sent to your provider as well.   ?To learn more about what you can do with MyChart, go to [email or web address].   ? ?Your next appointment:   ?Follow up as needed with Dr. Sallyanne Kuster ? ? ?Other Instructions ?Eat a low sodium diet ? ?Important Information About Sugar ? ? ? ? ? ? ?

## 2022-04-05 ENCOUNTER — Encounter: Payer: Self-pay | Admitting: Cardiovascular Disease

## 2022-04-18 ENCOUNTER — Emergency Department (HOSPITAL_COMMUNITY)
Admission: EM | Admit: 2022-04-18 | Discharge: 2022-04-18 | Disposition: A | Discharge status: Home / Self Care | Payer: Medicaid Other | Attending: Emergency Medicine | Admitting: Emergency Medicine

## 2022-04-18 ENCOUNTER — Emergency Department (HOSPITAL_COMMUNITY): Payer: Medicaid Other | Admitting: Anesthesiology

## 2022-04-18 ENCOUNTER — Other Ambulatory Visit (HOSPITAL_COMMUNITY): Payer: Self-pay

## 2022-04-18 ENCOUNTER — Encounter (HOSPITAL_COMMUNITY)
Admission: EM | Disposition: A | Discharge status: Home / Self Care | Payer: Self-pay | Source: Home / Self Care | Attending: Emergency Medicine

## 2022-04-18 ENCOUNTER — Emergency Department (EMERGENCY_DEPARTMENT_HOSPITAL): Payer: Medicaid Other | Admitting: Anesthesiology

## 2022-04-18 ENCOUNTER — Other Ambulatory Visit: Payer: Self-pay

## 2022-04-18 ENCOUNTER — Emergency Department (HOSPITAL_COMMUNITY): Payer: Medicaid Other

## 2022-04-18 ENCOUNTER — Encounter (HOSPITAL_COMMUNITY): Payer: Self-pay

## 2022-04-18 DIAGNOSIS — K222 Esophageal obstruction: Secondary | ICD-10-CM | POA: Insufficient documentation

## 2022-04-18 DIAGNOSIS — K449 Diaphragmatic hernia without obstruction or gangrene: Secondary | ICD-10-CM | POA: Insufficient documentation

## 2022-04-18 DIAGNOSIS — R000 Tachycardia, unspecified: Secondary | ICD-10-CM | POA: Diagnosis not present

## 2022-04-18 DIAGNOSIS — Z79899 Other long term (current) drug therapy: Secondary | ICD-10-CM | POA: Insufficient documentation

## 2022-04-18 DIAGNOSIS — F17200 Nicotine dependence, unspecified, uncomplicated: Secondary | ICD-10-CM | POA: Diagnosis not present

## 2022-04-18 DIAGNOSIS — Z85118 Personal history of other malignant neoplasm of bronchus and lung: Secondary | ICD-10-CM | POA: Insufficient documentation

## 2022-04-18 DIAGNOSIS — K2210 Ulcer of esophagus without bleeding: Secondary | ICD-10-CM | POA: Diagnosis not present

## 2022-04-18 DIAGNOSIS — T18108A Unspecified foreign body in esophagus causing other injury, initial encounter: Secondary | ICD-10-CM | POA: Diagnosis not present

## 2022-04-18 DIAGNOSIS — T18128A Food in esophagus causing other injury, initial encounter: Secondary | ICD-10-CM

## 2022-04-18 DIAGNOSIS — Z7901 Long term (current) use of anticoagulants: Secondary | ICD-10-CM | POA: Insufficient documentation

## 2022-04-18 DIAGNOSIS — J439 Emphysema, unspecified: Secondary | ICD-10-CM | POA: Diagnosis not present

## 2022-04-18 DIAGNOSIS — J449 Chronic obstructive pulmonary disease, unspecified: Secondary | ICD-10-CM

## 2022-04-18 DIAGNOSIS — E876 Hypokalemia: Secondary | ICD-10-CM | POA: Insufficient documentation

## 2022-04-18 HISTORY — PX: ESOPHAGOGASTRODUODENOSCOPY: SHX5428

## 2022-04-18 HISTORY — PX: FOREIGN BODY REMOVAL: SHX962

## 2022-04-18 LAB — I-STAT CHEM 8, ED
BUN: 13 mg/dL (ref 6–20)
Calcium, Ion: 1.29 mmol/L (ref 1.15–1.40)
Chloride: 107 mmol/L (ref 98–111)
Creatinine, Ser: 0.9 mg/dL (ref 0.44–1.00)
Glucose, Bld: 98 mg/dL (ref 70–99)
HCT: 37 % (ref 36.0–46.0)
Hemoglobin: 12.6 g/dL (ref 12.0–15.0)
Potassium: 2.9 mmol/L — ABNORMAL LOW (ref 3.5–5.1)
Sodium: 142 mmol/L (ref 135–145)
TCO2: 25 mmol/L (ref 22–32)

## 2022-04-18 SURGERY — EGD (ESOPHAGOGASTRODUODENOSCOPY)
Anesthesia: General

## 2022-04-18 MED ORDER — ONDANSETRON HCL 4 MG/2ML IJ SOLN
INTRAMUSCULAR | Status: DC | PRN
Start: 2022-04-18 — End: 2022-04-18
  Administered 2022-04-18: 4 mg via INTRAVENOUS

## 2022-04-18 MED ORDER — POTASSIUM CHLORIDE 20 MEQ PO PACK
20.0000 meq | PACK | Freq: Two times a day (BID) | ORAL | 0 refills | Status: DC
  Filled 2022-04-18: qty 10, 5d supply, fill #0

## 2022-04-18 MED ORDER — FENTANYL CITRATE PF 50 MCG/ML IJ SOSY
25.0000 ug | PREFILLED_SYRINGE | Freq: Once | INTRAMUSCULAR | Status: AC
  Administered 2022-04-18: 25 ug via INTRAVENOUS
  Filled 2022-04-18: qty 1

## 2022-04-18 MED ORDER — PROPOFOL 10 MG/ML IV BOLUS
INTRAVENOUS | Status: AC
  Filled 2022-04-18: qty 20

## 2022-04-18 MED ORDER — SUCCINYLCHOLINE CHLORIDE 200 MG/10ML IV SOSY
PREFILLED_SYRINGE | INTRAVENOUS | Status: DC | PRN
Start: 2022-04-18 — End: 2022-04-18
  Administered 2022-04-18: 120 mg via INTRAVENOUS

## 2022-04-18 MED ORDER — FENTANYL CITRATE (PF) 100 MCG/2ML IJ SOLN
INTRAMUSCULAR | Status: DC | PRN
  Administered 2022-04-18: 100 ug via INTRAVENOUS

## 2022-04-18 MED ORDER — LACTATED RINGERS IV BOLUS
1000.0000 mL | Freq: Once | INTRAVENOUS | Status: AC
  Administered 2022-04-18: 1000 mL via INTRAVENOUS

## 2022-04-18 MED ORDER — MIDAZOLAM HCL 2 MG/2ML IJ SOLN
INTRAMUSCULAR | Status: AC
  Filled 2022-04-18: qty 2

## 2022-04-18 MED ORDER — FENTANYL CITRATE (PF) 100 MCG/2ML IJ SOLN
INTRAMUSCULAR | Status: AC
Start: 2022-04-18 — End: ?
  Filled 2022-04-18: qty 2

## 2022-04-18 MED ORDER — PHENYLEPHRINE 80 MCG/ML (10ML) SYRINGE FOR IV PUSH (FOR BLOOD PRESSURE SUPPORT)
PREFILLED_SYRINGE | INTRAVENOUS | Status: DC | PRN
  Administered 2022-04-18 (×2): 80 ug via INTRAVENOUS
  Administered 2022-04-18: 160 ug via INTRAVENOUS

## 2022-04-18 MED ORDER — LACTATED RINGERS IV SOLN: INTRAVENOUS | Status: DC | PRN

## 2022-04-18 MED ORDER — LIDOCAINE 2% (20 MG/ML) 5 ML SYRINGE
INTRAMUSCULAR | Status: DC | PRN
  Administered 2022-04-18: 200 mg via INTRAVENOUS

## 2022-04-18 MED ORDER — PROPOFOL 10 MG/ML IV BOLUS
INTRAVENOUS | Status: DC | PRN
  Administered 2022-04-18: 14 mg via INTRAVENOUS

## 2022-04-18 NOTE — Anesthesia Procedure Notes (Signed)
Procedure Name: Intubation Date/Time: 04/18/2022 12:27 PM Performed by: Rosaland Lao, CRNA Pre-anesthesia Checklist: Patient identified, Emergency Drugs available, Suction available and Patient being monitored Patient Re-evaluated:Patient Re-evaluated prior to induction Oxygen Delivery Method: Circle system utilized Preoxygenation: Pre-oxygenation with 100% oxygen Induction Type: IV induction and Rapid sequence Laryngoscope Size: Miller and 2 Grade View: Grade I Tube type: Oral Tube size: 7.0 mm Number of attempts: 1 Airway Equipment and Method: Stylet Placement Confirmation: ETT inserted through vocal cords under direct vision, positive ETCO2 and breath sounds checked- equal and bilateral Tube secured with: Tape Dental Injury: Teeth and Oropharynx as per pre-operative assessment

## 2022-04-18 NOTE — Anesthesia Preprocedure Evaluation (Signed)
Anesthesia Evaluation  Patient identified by MRN, date of birth, ID band Patient awake    Reviewed: Allergy & Precautions, NPO status , Patient's Chart, lab work & pertinent test results  Airway Mallampati: II  TM Distance: >3 FB Neck ROM: Full    Dental no notable dental hx.    Pulmonary COPD, Current Smoker and Patient abstained from smoking.,  Lung cancer S/P Rad Tx   Pulmonary exam normal breath sounds clear to auscultation       Cardiovascular negative cardio ROS Normal cardiovascular exam Rhythm:Regular Rate:Normal     Neuro/Psych negative neurological ROS  negative psych ROS   GI/Hepatic negative GI ROS, Neg liver ROS,   Endo/Other  negative endocrine ROS  Renal/GU negative Renal ROS  negative genitourinary   Musculoskeletal negative musculoskeletal ROS (+)   Abdominal   Peds negative pediatric ROS (+)  Hematology negative hematology ROS (+)   Anesthesia Other Findings   Reproductive/Obstetrics negative OB ROS                             Anesthesia Physical Anesthesia Plan  ASA: 3  Anesthesia Plan: General   Post-op Pain Management:    Induction: Intravenous and Rapid sequence  PONV Risk Score and Plan: 3 and Ondansetron and Treatment may vary due to age or medical condition  Airway Management Planned: Simple Face Mask and Oral ETT  Additional Equipment:   Intra-op Plan:   Post-operative Plan: Extubation in OR  Informed Consent: I have reviewed the patients History and Physical, chart, labs and discussed the procedure including the risks, benefits and alternatives for the proposed anesthesia with the patient or authorized representative who has indicated his/her understanding and acceptance.     Dental advisory given  Plan Discussed with: CRNA and Surgeon  Anesthesia Plan Comments:         Anesthesia Quick Evaluation

## 2022-04-18 NOTE — ED Provider Notes (Signed)
[facility] DEPT Provider Note   CSN: [id number] Arrival date & time: 04/18/22  2951     History  No chief complaint on file.   [name] is a 51 y.o. female.  HPI 51 yo female co esophageal food impaction.  Patient has been unbale to keep things down since yesterday am.  Patient was eating hash brown and tenderloin.  She has had similar several times in past.  Patient diagnosed lung cancer October 2022 and started radiation therapy for 6 weeks.  Since then she has had several egd by Dr. Michail Sermon.  Patient is intermittently able to handle secretions but has not at times and has only urinated  x 2 since event. Patient not currently taking any meds       Home Medications Prior to Admission medications   Medication Sig Start Date End Date Taking? Authorizing Provider  potassium chloride (KLOR-CON) 20 MEQ packet Take 20 mEq by mouth 2 (two) times daily for 5 days. 04/18/22 04/23/22 Yes Javeah Loeza, Andee Poles, MD  albuterol (VENTOLIN HFA) 108 (90 Base) MCG/ACT inhaler Inhale 2 puffs into the lungs every 6 (six) hours as needed for wheezing or shortness of breath. 02/08/22   Mercy Riding, MD  apixaban (ELIQUIS) 5 MG TABS tablet Take 1 tablet (5 mg total) by mouth 2 (two) times daily. Patient not taking: Reported on 04/04/2022 03/18/22   Aline August, MD  diphenhydramine-acetaminophen (TYLENOL PM) 25-500 MG TABS tablet Take 3 tablets by mouth at bedtime as needed (for sleep).    [provider]  folic acid (FOLVITE) 1 MG tablet Take 1 tablet (1 mg total) by mouth daily. Patient not taking: Reported on 03/03/2022 02/09/22   Mercy Riding, MD  Multiple Vitamin (MULTIVITAMIN) LIQD Take 15 mLs by mouth daily. Patient not taking: Reported on 03/03/2022 02/08/22   Mercy Riding, MD  nicotine (NICODERM CQ - DOSED IN MG/24 HOURS) 21 mg/24hr patch Place 1 patch (21 mg total) onto the skin daily. Patient not taking: Reported on 04/04/2022 03/07/22   Oswald Hillock, MD   ondansetron (ZOFRAN-ODT) 8 MG disintegrating tablet Dissolve 1 tablet (8 mg total) by mouth every 8 (eight) hours as needed for nausea or vomiting. Patient not taking: Reported on 04/04/2022 02/08/22   Mercy Riding, MD  oxyCODONE (OXY IR/ROXICODONE) 5 MG immediate release tablet Take 1 tablet (5 mg total) by mouth every 6 (six) hours as needed for severe pain. Patient not taking: Reported on 04/04/2022 03/07/22   Oswald Hillock, MD  pantoprazole (PROTONIX) 40 MG tablet Take 1 tablet (40 mg total) by mouth 2 (two) times daily. Patient not taking: Reported on 03/13/2022 03/07/22 06/05/22  Oswald Hillock, MD  sucralfate (CARAFATE) 1 GM/10ML suspension Take 10 mLs (1 g total) by mouth 4 (four) times daily -  with meals and at bedtime. Patient not taking: Reported on 04/04/2022 03/07/22   Oswald Hillock, MD  tiotropium (SPIRIVA HANDIHALER) 18 MCG inhalation capsule Place 1 capsule (18 mcg total) into inhaler and inhale daily. Patient not taking: Reported on 03/03/2022 02/08/22 05/09/22  Mercy Riding, MD      Allergies    Compazine [prochlorperazine], Naproxen, and Bactrim [sulfamethoxazole-trimethoprim]    Review of Systems   Review of Systems  Physical Exam Updated Vital Signs BP 126/80   Pulse (!) 121   Temp 98 F (36.7 C) (Temporal)   Resp 19   Ht 1.727 m (5\' 8" )   Wt 58.5 kg  LMP 04/24/2018   SpO2 96%   BMI 19.61 kg/m  Physical Exam Vitals and nursing note reviewed.  Constitutional:      Appearance: Normal appearance.  HENT:     Head: Normocephalic.     Right Ear: External ear normal.     Left Ear: External ear normal.     Nose: Nose normal.     Mouth/Throat:     Pharynx: Oropharynx is clear.  Eyes:     Extraocular Movements: Extraocular movements intact.     Pupils: Pupils are equal, round, and reactive to light.  Cardiovascular:     Rate and Rhythm: Normal rate and regular rhythm.     Pulses: Normal pulses.  Pulmonary:     Effort: Pulmonary effort is normal.  Abdominal:      General: Abdomen is flat.     Palpations: Abdomen is soft.  Musculoskeletal:        General: Normal range of motion.     Cervical back: Normal range of motion.  Skin:    General: Skin is warm.     Capillary Refill: Capillary refill takes less than 2 seconds.  Neurological:     General: No focal deficit present.     Mental Status: She is alert.  Psychiatric:        Mood and Affect: Mood normal.    ED Results / Procedures / Treatments   Labs (all labs ordered are listed, but only abnormal results are displayed) Labs Reviewed  I-STAT CHEM 8, ED - Abnormal; Notable for the following components:      Result Value   Potassium 2.9 (*)    All other components within normal limits    EKG EKG Interpretation  Date/Time:  Thursday Apr 18 2022 10:15:30 EDT Ventricular Rate:  116 PR Interval:  174 QRS Duration: 102 QT Interval:  348 QTC Calculation: 484 R Axis:   87 Text Interpretation: Sinus tachycardia Biatrial enlargement Nonspecific T abnormalities, inferior leads no change from prior ekg Confirmed by Pattricia Boss [phone]) on 04/18/2022 10:24:35 AM  Radiology DG Chest Port 1 View  Result Date: 04/18/2022 CLINICAL DATA:  Food impaction, recent esophageal stretching EXAM: PORTABLE CHEST 1 VIEW COMPARISON:  03/13/2022 FINDINGS: The heart size and mediastinal contours are within normal limits. Emphysema. The visualized skeletal structures are unremarkable. IMPRESSION: 1. No acute abnormality of the lungs in AP portable projection. No radiographic evidence of esophageal impaction. Please note that ingested food is typically not radiopaque. 2.  Emphysema. Electronically Signed   By: Delanna Ahmadi M.D.   On: 04/18/2022 10:41    Procedures Procedures    Medications Ordered in ED Medications  lactated ringers bolus 1,000 mL (0 mLs Intravenous Stopped 04/18/22 1355)  fentaNYL (SUBLIMAZE) injection 25 mcg (25 mcg Intravenous Given 04/18/22 2841)    ED Course/ Medical Decision Making/  A&P Clinical Course as of 04/18/22 1405  Thu Apr 18, 2022  1021 I-STAT reviewed interpreted hypokalemia noted at 2.9 and will give run of potassium [DR]    Clinical Course User Index [DR] Pattricia Boss, MD                           Medical Decision Making Patient presents with symptoms consistent with food impaction - 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   Patient not currently on any medications including eliquis She otherwise appears stable Discussed with Dr. Watt Climes who will see for endoscopy 1- esophageal food impaction 2-  hypokalemia- likely secondary to #1 iv replenishment 3-tachycardia- appears stable for patient- mother states hr baseline and comparison ekg consistent 4- lung cancer- currently reported stable  Amount and/or Complexity of Data Reviewed Radiology: ordered.  Risk Prescription drug management. Decision regarding hospitalization.           Final Clinical Impression(s) / ED Diagnoses Final diagnoses:  Esophageal obstruction due to food impaction  Hypokalemia    Rx / DC Orders ED Discharge Orders          Ordered    potassium chloride (KLOR-CON) 20 MEQ packet  2 times daily        04/18/22 1404              Pattricia Boss, MD 04/18/22 1405

## 2022-04-18 NOTE — Transfer of Care (Signed)
Immediate Anesthesia Transfer of Care Note  Patient: [name]  Procedure(s) Performed: ESOPHAGOGASTRODUODENOSCOPY (EGD) BALLOON DILATION  Patient Location: PACU and Endoscopy Unit  Anesthesia Type:General  Level of Consciousness: awake, alert  and oriented  Airway & Oxygen Therapy: Patient Spontanous Breathing  Post-op Assessment: Report given to RN and Post -op Vital signs reviewed and stable  Post vital signs: Reviewed and stable  Last Vitals:  Vitals Value Taken Time  BP    Temp    Pulse    Resp    SpO2      Last Pain:  Vitals:   04/18/22 1107  TempSrc: Temporal  PainSc: 10-Worst pain ever         Complications: No notable events documented.

## 2022-04-18 NOTE — Discharge Instructions (Addendum)
Liquids only today slowly advance diet tomorrow please take your pantoprazole i.e. Protonix and your Carafate i.e. sucralfate and we called in a prescription for potassium for 3 days and remember to eat soft solids only eat slowly chew your food well during plenty of fluids and call us if we can be of any assistance otherwise please get a gastroenterologist that is covered by your insurance  Please pick up your potassium packets, take for the next 5 days as prescribed.  Have potassium level rechecked by your doctor in one week  YOU HAD AN ENDOSCOPIC PROCEDURE TODAY: Refer to the procedure report and other information in the discharge instructions given to you for any specific questions about what was found during the examination. If this information does not answer your questions, please call [facility] office at [phone] to clarify.   YOU SHOULD EXPECT: Some feelings of bloating in the abdomen. Passage of more gas than usual. Walking can help get rid of the air that was put into your GI tract during the procedure and reduce the bloating. If you had a lower endoscopy (such as a colonoscopy or flexible sigmoidoscopy) you may notice spotting of blood in your stool or on the toilet paper. Some abdominal soreness may be present for a day or two, also.  DIET: Your first meal following the procedure should be a light meal and then it is ok to progress to your normal diet. A half-sandwich or bowl of soup is an example of a good first meal. Heavy or fried foods are harder to digest and may make you feel nauseous or bloated. Drink plenty of fluids but you should avoid alcoholic beverages for 24 hours. If you had a esophageal dilation, please see attached instructions for diet.    ACTIVITY: Your care partner should take you home directly after the procedure. You should plan to take it easy, moving slowly for the rest of the day. You can resume normal activity the day after the procedure however YOU SHOULD NOT  DRIVE, use power tools, machinery or perform tasks that involve climbing or major physical exertion for 24 hours (because of the sedation medicines used during the test).   SYMPTOMS TO REPORT IMMEDIATELY: A gastroenterologist can be reached at any hour. Please call [phone]  for any of the following symptoms:  Following upper endoscopy (EGD, EUS, ERCP, esophageal dilation) Vomiting of blood or coffee ground material  New, significant abdominal pain  New, significant chest pain or pain under the shoulder blades  Painful or persistently difficult swallowing  New shortness of breath  Black, tarry-looking or red, bloody stools  FOLLOW UP:  If any biopsies were taken you will be contacted by phone or by letter within the next 1-3 weeks. Call [phone]  if you have not heard about the biopsies in 3 weeks.  Please also call with any specific questions about appointments or follow up tests. YOU HAD AN ENDOSCOPIC PROCEDURE TODAY: Refer to the procedure report and other information in the discharge instructions given to you for any specific questions about what was found during the examination. If this information does not answer your questions, please call [facility] office at [phone] to clarify.   [name]

## 2022-04-18 NOTE — Anesthesia Postprocedure Evaluation (Signed)
Anesthesia Post Note  Patient: [name]  Procedure(s) Performed: ESOPHAGOGASTRODUODENOSCOPY (EGD) BALLOON DILATION     Patient location during evaluation: PACU Anesthesia Type: General Level of consciousness: awake and alert Pain management: pain level controlled Vital Signs Assessment: post-procedure vital signs reviewed and stable Respiratory status: spontaneous breathing, nonlabored ventilation, respiratory function stable and patient connected to nasal cannula oxygen Cardiovascular status: blood pressure returned to baseline and stable Postop Assessment: no apparent nausea or vomiting Anesthetic complications: no   No notable events documented.  Last Vitals:  Vitals:   04/18/22 1045 04/18/22 1107  BP: (!) 120/94 110/89  Pulse: (!) 112 (!) 106  Resp: (!) 29 12  Temp:  36.5 C  SpO2: 95% 95%    Last Pain:  Vitals:   04/18/22 1107  TempSrc: Temporal  PainSc: 10-Worst pain ever                 Wilman Tucker S

## 2022-04-18 NOTE — Op Note (Signed)
[facility] Patient Name: [name] Procedure Date: 04/18/2022 MRN: [id number] Attending MD: Clarene Essex , MD Date of Birth: 07-27-1971 CSN: [id number] Age: 51 Admit Type: Outpatient Procedure:                Upper GI endoscopy Indications:              Dysphagia, Foreign body in the esophagus Providers:                Clarene Essex, MD, Allayne Gitelman, RN, Despina Pole,                            Technician, Margurite Auerbach Referring MD:              Medicines:                General Anesthesia Complications:            No immediate complications. Estimated Blood Loss:     Estimated blood loss: none. Estimated blood loss:                            none. Procedure:                Pre-Anesthesia Assessment:                           - Prior to the procedure, a History and Physical                            was performed, and patient medications and                            allergies were reviewed. The patient's tolerance of                            previous anesthesia was also reviewed. The risks                            and benefits of the procedure and the sedation                            options and risks were discussed with the patient.                            All questions were answered, and informed consent                            was obtained. Prior Anticoagulants: The patient has                            taken no previous anticoagulant or antiplatelet                            agents. ASA Grade Assessment: III - A patient with  severe systemic disease. After reviewing the risks                            and benefits, the patient was deemed in                            satisfactory condition to undergo the procedure.                           After obtaining informed consent, the endoscope was                            passed under direct vision. Throughout the                            procedure, the patient's blood  pressure, pulse, and                            oxygen saturations were monitored continuously. The                            [id number]-[id number] ([id number]) Olympus endoscope was introduced                            through the mouth, and advanced to the second part                            of duodenum. The upper GI endoscopy was                            accomplished without difficulty. The patient                            tolerated the procedure well. Scope In: Scope Out: Findings:      One moderate (circumferential scarring or stenosis with some ulceration;       an endoscope may pass) stenosis was found. The stenosis was traversed. A       TTS dilator was passed through the scope. Dilation with a 12-13.5-15 mm       balloon dilator was performed to 15 mm. We used all 3 of the dilator       sizes the dilation site was examined and showed mild mucosal disruption.      The entire examined stomach was normal.      The duodenal bulb, first portion of the duodenum and second portion of       the duodenum were normal.      The exam was otherwise without abnormality.      A small hiatal hernia was present. Impression:               - Esophageal stenosis and ulceration. Dilated.                           - Normal stomach.                           -  Normal duodenal bulb, first portion of the                            duodenum and second portion of the duodenum.                           - The examination was otherwise normal.                           - Small hiatal hernia.                           - No specimens collected. Moderate Sedation:      Not Applicable - Patient had care per Anesthesia. Recommendation:           - Patient has a contact number available for                            emergencies. The signs and symptoms of potential                            delayed complications were discussed with the                            patient. Return to normal activities tomorrow.                             Written discharge instructions were provided to the                            patient.                           - Clear liquid diet for 4 hours. May advance to                            heavy liquids later today if okay                           - Continue present medications. Take your to                            stomach medicines pantoprazole Protonix and                            Carafate sucralfate as it ordered                           - Return to GI clinic PRN.                           - Telephone GI clinic if symptomatic PRN. Procedure Code(s):        --- Professional ---                           [phone],  Esophagogastroduodenoscopy, flexible,                            transoral; with transendoscopic balloon dilation of                            esophagus (less than 30 mm diameter) Diagnosis Code(s):        --- Professional ---                           K22.2, Esophageal obstruction                           R13.10, Dysphagia, unspecified                           T18.108A, Unspecified foreign body in esophagus                            causing other injury, initial encounter CPT copyright 2019 [employer]. All rights reserved. The codes documented in this report are preliminary and upon coder review may  be revised to meet current compliance requirements. Clarene Essex, MD 04/18/2022 1:14:53 PM This report has been signed electronically. Number of Addenda: 0

## 2022-04-18 NOTE — Consult Note (Signed)
Reason for Consult: Food impaction Referring Physician: ER physician  [name] is an 51 y.o. female.  HPI: Patient well-known to me from previous food impactions and dysphagia who says she has been doing well at home for the last month unfortunately about once a week she has to work hard to get food up otherwise she has been doing well and her energy is better and currently she believes she got pork or hashbrowns caught and has not established a new gastroenterologist since she has the insurance problems we have discussed before and she is hungry and wants to eat and has no new complaints and does say her last dilation did seem to help and she is no longer on blood thinners and actually takes no other medicines as well  Past Medical History:  Diagnosis Date   Ankle sprain    left   Anxiety    treated for panic attacks in the past.   Asthma    COPD (chronic obstructive pulmonary disease) ([place])    Depression    History of radiation therapy    Right lung- 09/26/21-11/13/21- Dr. Gery Pray   Irregular heart rate    Lung cancer ([place])    Renal disorder    kidney infections    Past Surgical History:  Procedure Laterality Date   APPENDECTOMY     BALLOON DILATION N/A 03/15/2022   Procedure: BALLOON DILATION;  Surgeon: Ronnette Juniper, MD;  Location: [facility] ENDOSCOPY;  Service: Gastroenterology;  Laterality: N/A;   BRONCHIAL BIOPSY  09/21/2021   Procedure: BRONCHIAL BIOPSIES;  Surgeon: Collene Gobble, MD;  Location: [facility] ENDOSCOPY;  Service: Pulmonary;;   BRONCHIAL BRUSHINGS  09/21/2021   Procedure: BRONCHIAL BRUSHINGS;  Surgeon: Collene Gobble, MD;  Location: [facility] ENDOSCOPY;  Service: Pulmonary;;   BRONCHIAL NEEDLE ASPIRATION BIOPSY  09/21/2021   Procedure: BRONCHIAL NEEDLE ASPIRATION BIOPSIES;  Surgeon: Collene Gobble, MD;  Location: [facility] ENDOSCOPY;  Service: Pulmonary;;   ESOPHAGEAL STENT PLACEMENT N/A 03/06/2022   Procedure: ESOPHAGEAL STENT PLACEMENT;  Surgeon: Clarene Essex, MD;  Location: [facility]  ENDOSCOPY;  Service: Gastroenterology;  Laterality: N/A;   ESOPHAGOGASTRODUODENOSCOPY N/A 12/26/2021   Procedure: ESOPHAGOGASTRODUODENOSCOPY (EGD);  Surgeon: Clarene Essex, MD;  Location: [facility];  Service: Endoscopy;  Laterality: N/A;   ESOPHAGOGASTRODUODENOSCOPY N/A 02/11/2022   Procedure: ESOPHAGOGASTRODUODENOSCOPY (EGD);  Surgeon: Arta Silence, MD;  Location: [facility] ENDOSCOPY;  Service: Gastroenterology;  Laterality: N/A;   ESOPHAGOGASTRODUODENOSCOPY N/A 03/03/2022   Procedure: ESOPHAGOGASTRODUODENOSCOPY (EGD);  Surgeon: Wilford Corner, MD;  Location: [facility] ENDOSCOPY;  Service: Gastroenterology;  Laterality: N/A;   ESOPHAGOGASTRODUODENOSCOPY N/A 03/06/2022   Procedure: ESOPHAGOGASTRODUODENOSCOPY (EGD);  Surgeon: Clarene Essex, MD;  Location: [facility] ENDOSCOPY;  Service: Gastroenterology;  Laterality: N/A;  With esophageal stent placement   ESOPHAGOGASTRODUODENOSCOPY N/A 03/15/2022   Procedure: ESOPHAGOGASTRODUODENOSCOPY (EGD);  Surgeon: Ronnette Juniper, MD;  Location: [facility] ENDOSCOPY;  Service: Gastroenterology;  Laterality: N/A;  with dilation   ESOPHAGOGASTRODUODENOSCOPY (EGD) WITH PROPOFOL N/A 01/07/2022   Procedure: ESOPHAGOGASTRODUODENOSCOPY (EGD) WITH PROPOFOL;  Surgeon: Otis Brace, MD;  Location: [facility] ENDOSCOPY;  Service: Gastroenterology;  Laterality: N/A;   ESOPHAGOGASTRODUODENOSCOPY (EGD) WITH PROPOFOL N/A 02/02/2022   Procedure: ESOPHAGOGASTRODUODENOSCOPY (EGD) WITH PROPOFOL;  Surgeon: Ronnette Juniper, MD;  Location: [facility]OS[facility];  Service: Gastroenterology;  Laterality: N/A;   ESOPHAGOGASTRODUODENOSCOPY (EGD) WITH PROPOFOL N/A 02/04/2022   Procedure: ESOPHAGOGASTRODUODENOSCOPY (EGD) WITH PROPOFOL;  Surgeon: Otis Brace, MD;  Location: [facility] ENDOSCOPY;  Service: Gastroenterology;  Laterality: N/A;   ESOPHAGOGASTRODUODENOSCOPY (EGD) WITH PROPOFOL N/A 03/13/2022   Procedure: ESOPHAGOGASTRODUODENOSCOPY (EGD) WITH PROPOFOL;  Surgeon: Clarene Essex, MD;  Location: [facility] ENDOSCOPY;  Service: Gastroenterology;   Laterality: N/A;   FOREIGN BODY REMOVAL  12/26/2021   Procedure: FOREIGN BODY REMOVAL;  Surgeon: Clarene Essex, MD;  Location: [facility] ENDOSCOPY;  Service: Endoscopy;;   FOREIGN BODY REMOVAL  02/02/2022   Procedure: FOREIGN BODY REMOVAL;  Surgeon: Ronnette Juniper, MD;  Location: [facility] ENDOSCOPY;  Service: Gastroenterology;;   FOREIGN BODY REMOVAL  02/11/2022   Procedure: FOREIGN BODY REMOVAL;  Surgeon: Arta Silence, MD;  Location: [facility] ENDOS[facility];  Service: Gastroenterology;;   FOREIGN BODY REMOVAL N/A 03/03/2022   Procedure: FOREIGN BODY REMOVAL;  Surgeon: Wilford Corner, MD;  Location: [facility] ENDOSCOPY;  Service: Gastroenterology;  Laterality: N/A;   IR CHEST FLUORO  12/27/2021   LUMBAR LAMINECTOMY N/A 05/13/2018   Procedure: LEFT LUMBAR FOUR-FIVE MICRODISCECTOMY;  Surgeon: Marybelle Killings, MD;  Location: [facility];  Service: Orthopedics;  Laterality: N/A;   STENT REMOVAL  03/13/2022   Procedure: STENT REMOVAL;  Surgeon: Clarene Essex, MD;  Location: [facility];  Service: Gastroenterology;;   TUBAL LIGATION     TUBAL LIGATION  1998   VIDEO BRONCHOSCOPY WITH ENDOBRONCHIAL ULTRASOUND N/A 09/21/2021   Procedure: VIDEO BRONCHOSCOPY WITH ENDOBRONCHIAL ULTRASOUND;  Surgeon: Collene Gobble, MD;  Location: [facility] ENDOSCOPY;  Service: Pulmonary;  Laterality: N/A;    Family History  Problem Relation Age of Onset   Hypertension Mother    Hypertension Father    Heart disease Father     Social History:  reports that she has been smoking cigarettes. She has a 15.00 pack-year smoking history. She has never used smokeless tobacco. She reports that she does not currently use alcohol. She reports that she does not currently use drugs after having used the following drugs: Marijuana.  Allergies:  Allergies  Allergen Reactions   Compazine [Prochlorperazine] Other (See Comments)    Hypotension and lethargy   Naproxen Nausea Only   Bactrim [Sulfamethoxazole-Trimethoprim] Nausea Only    Medications: I have reviewed the patient's  current medications.  Results for orders placed or performed during the hospital encounter of 04/18/22 (from the past 48 hour(s))  I-stat chem 8, ED (not at [facility] or [facility])     Status: Abnormal   Collection Time: 04/18/22 10:08 AM  Result Value Ref Range   Sodium 142 135 - 145 mmol/L   Potassium 2.9 (L) 3.5 - 5.1 mmol/L   Chloride 107 98 - 111 mmol/L   BUN 13 6 - 20 mg/dL   Creatinine, Ser 0.90 0.44 - 1.00 mg/dL   Glucose, Bld 98 70 - 99 mg/dL    Comment: Glucose reference range applies only to samples taken after fasting for at least 8 hours.   Calcium, Ion 1.29 1.15 - 1.40 mmol/L   TCO2 25 22 - 32 mmol/L   Hemoglobin 12.6 12.0 - 15.0 g/dL   HCT 37.0 36.0 - 46.0 %    DG Chest Port 1 View  Result Date: 04/18/2022 CLINICAL DATA:  Food impaction, recent esophageal stretching EXAM: PORTABLE CHEST 1 VIEW COMPARISON:  03/13/2022 FINDINGS: The heart size and mediastinal contours are within normal limits. Emphysema. The visualized skeletal structures are unremarkable. IMPRESSION: 1. No acute abnormality of the lungs in AP portable projection. No radiographic evidence of esophageal impaction. Please note that ingested food is typically not radiopaque. 2.  Emphysema. Electronically Signed   By: Delanna Ahmadi M.D.   On: 04/18/2022 10:41    Review of Systems negative except above Blood pressure 110/89, pulse (!) 106, temperature 97.7 F (  36.5 C), temperature source Temporal, resp. rate 12, height 5\' 8"  (1.727 m), weight 58.5 kg, last menstrual period 04/24/2018, SpO2 95 %. 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

## 2022-04-18 NOTE — ED Triage Notes (Signed)
Patient states she began not being able to swallow solid food or liquids. Patient states she is unable to swallow her own saliva at times. Patient states she had her esophagus stretched approx one month ago.

## 2022-04-20 ENCOUNTER — Emergency Department (HOSPITAL_COMMUNITY): Payer: Medicaid Other | Admitting: Anesthesiology

## 2022-04-20 ENCOUNTER — Encounter (HOSPITAL_COMMUNITY)
Admission: EM | Disposition: A | Discharge status: Home / Self Care | Payer: Self-pay | Source: Home / Self Care | Attending: Emergency Medicine

## 2022-04-20 ENCOUNTER — Encounter (HOSPITAL_COMMUNITY): Payer: Self-pay | Admitting: *Deleted

## 2022-04-20 ENCOUNTER — Ambulatory Visit (HOSPITAL_COMMUNITY)
Admission: EM | Admit: 2022-04-20 | Discharge: 2022-04-20 | Disposition: A | Discharge status: Home / Self Care | Payer: Medicaid Other | Attending: Emergency Medicine | Admitting: Emergency Medicine

## 2022-04-20 ENCOUNTER — Emergency Department (EMERGENCY_DEPARTMENT_HOSPITAL): Payer: Medicaid Other | Admitting: Anesthesiology

## 2022-04-20 ENCOUNTER — Other Ambulatory Visit: Payer: Self-pay

## 2022-04-20 DIAGNOSIS — X58XXXA Exposure to other specified factors, initial encounter: Secondary | ICD-10-CM | POA: Diagnosis not present

## 2022-04-20 DIAGNOSIS — Z923 Personal history of irradiation: Secondary | ICD-10-CM | POA: Insufficient documentation

## 2022-04-20 DIAGNOSIS — J449 Chronic obstructive pulmonary disease, unspecified: Secondary | ICD-10-CM

## 2022-04-20 DIAGNOSIS — M1990 Unspecified osteoarthritis, unspecified site: Secondary | ICD-10-CM | POA: Diagnosis not present

## 2022-04-20 DIAGNOSIS — F419 Anxiety disorder, unspecified: Secondary | ICD-10-CM | POA: Diagnosis not present

## 2022-04-20 DIAGNOSIS — T182XXA Foreign body in stomach, initial encounter: Secondary | ICD-10-CM | POA: Diagnosis not present

## 2022-04-20 DIAGNOSIS — F17200 Nicotine dependence, unspecified, uncomplicated: Secondary | ICD-10-CM | POA: Diagnosis not present

## 2022-04-20 DIAGNOSIS — K222 Esophageal obstruction: Secondary | ICD-10-CM | POA: Diagnosis not present

## 2022-04-20 DIAGNOSIS — T18128A Food in esophagus causing other injury, initial encounter: Secondary | ICD-10-CM

## 2022-04-20 DIAGNOSIS — Z79899 Other long term (current) drug therapy: Secondary | ICD-10-CM | POA: Insufficient documentation

## 2022-04-20 DIAGNOSIS — F32A Depression, unspecified: Secondary | ICD-10-CM | POA: Insufficient documentation

## 2022-04-20 DIAGNOSIS — R1310 Dysphagia, unspecified: Secondary | ICD-10-CM | POA: Insufficient documentation

## 2022-04-20 DIAGNOSIS — F17210 Nicotine dependence, cigarettes, uncomplicated: Secondary | ICD-10-CM

## 2022-04-20 DIAGNOSIS — Z85118 Personal history of other malignant neoplasm of bronchus and lung: Secondary | ICD-10-CM | POA: Insufficient documentation

## 2022-04-20 HISTORY — PX: FOREIGN BODY REMOVAL: SHX962

## 2022-04-20 HISTORY — PX: ESOPHAGOSCOPY: SHX5534

## 2022-04-20 LAB — CBC WITH DIFFERENTIAL/PLATELET
Abs Immature Granulocytes: 0.02 10*3/uL (ref 0.00–0.07)
Basophils Absolute: 0 10*3/uL (ref 0.0–0.1)
Basophils Relative: 0 %
Eosinophils Absolute: 0.1 10*3/uL (ref 0.0–0.5)
Eosinophils Relative: 2 %
HCT: 33.6 % — ABNORMAL LOW (ref 36.0–46.0)
Hemoglobin: 11.5 g/dL — ABNORMAL LOW (ref 12.0–15.0)
Immature Granulocytes: 0 %
Lymphocytes Relative: 27 %
Lymphs Abs: 1.4 10*3/uL (ref 0.7–4.0)
MCH: 35.9 pg — ABNORMAL HIGH (ref 26.0–34.0)
MCHC: 34.2 g/dL (ref 30.0–36.0)
MCV: 105 fL — ABNORMAL HIGH (ref 80.0–100.0)
Monocytes Absolute: 0.5 10*3/uL (ref 0.1–1.0)
Monocytes Relative: 10 %
Neutro Abs: 3.1 10*3/uL (ref 1.7–7.7)
Neutrophils Relative %: 61 %
Platelets: 220 10*3/uL (ref 150–400)
RBC: 3.2 MIL/uL — ABNORMAL LOW (ref 3.87–5.11)
RDW: 13.5 % (ref 11.5–15.5)
WBC: 5.2 10*3/uL (ref 4.0–10.5)
nRBC: 0 % (ref 0.0–0.2)

## 2022-04-20 LAB — COMPREHENSIVE METABOLIC PANEL
ALT: 9 U/L (ref 0–44)
AST: 17 U/L (ref 15–41)
Albumin: 3.7 g/dL (ref 3.5–5.0)
Alkaline Phosphatase: 51 U/L (ref 38–126)
Anion gap: 9 (ref 5–15)
BUN: 7 mg/dL (ref 6–20)
CO2: 23 mmol/L (ref 22–32)
Calcium: 9.8 mg/dL (ref 8.9–10.3)
Chloride: 107 mmol/L (ref 98–111)
Creatinine, Ser: 0.93 mg/dL (ref 0.44–1.00)
GFR, Estimated: >60 mL/min (ref >60)
Glucose, Bld: 81 mg/dL (ref 70–99)
Potassium: 2.9 mmol/L — ABNORMAL LOW (ref 3.5–5.1)
Sodium: 139 mmol/L (ref 135–145)
Total Bilirubin: 0.8 mg/dL (ref 0.3–1.2)
Total Protein: 7.1 g/dL (ref 6.5–8.1)

## 2022-04-20 SURGERY — ESOPHAGOSCOPY
Anesthesia: General

## 2022-04-20 MED ORDER — FENTANYL CITRATE PF 50 MCG/ML IJ SOSY
PREFILLED_SYRINGE | INTRAMUSCULAR | Status: AC
  Administered 2022-04-20: 50 ug via INTRAVENOUS
  Filled 2022-04-20: qty 1

## 2022-04-20 MED ORDER — SODIUM CHLORIDE 0.9 % IV SOLN: INTRAVENOUS | Status: DC

## 2022-04-20 MED ORDER — ONDANSETRON HCL 4 MG/2ML IJ SOLN
INTRAMUSCULAR | Status: DC | PRN
  Administered 2022-04-20: 4 mg via INTRAVENOUS

## 2022-04-20 MED ORDER — SUCCINYLCHOLINE CHLORIDE 200 MG/10ML IV SOSY
PREFILLED_SYRINGE | INTRAVENOUS | Status: DC | PRN
Start: 2022-04-20 — End: 2022-04-20
  Administered 2022-04-20: 120 mg via INTRAVENOUS

## 2022-04-20 MED ORDER — PROPOFOL 10 MG/ML IV BOLUS
INTRAVENOUS | Status: DC | PRN
  Administered 2022-04-20: 180 mg via INTRAVENOUS

## 2022-04-20 MED ORDER — PROPOFOL 10 MG/ML IV BOLUS
INTRAVENOUS | Status: AC
  Filled 2022-04-20: qty 20

## 2022-04-20 MED ORDER — DEXAMETHASONE SODIUM PHOSPHATE 10 MG/ML IJ SOLN
INTRAMUSCULAR | Status: DC | PRN
  Administered 2022-04-20: 10 mg via INTRAVENOUS

## 2022-04-20 MED ORDER — FENTANYL CITRATE (PF) 100 MCG/2ML IJ SOLN
INTRAMUSCULAR | Status: AC
  Filled 2022-04-20: qty 2

## 2022-04-20 MED ORDER — LACTATED RINGERS IV SOLN: INTRAVENOUS | Status: DC | PRN

## 2022-04-20 MED ORDER — FENTANYL CITRATE (PF) 100 MCG/2ML IJ SOLN
INTRAMUSCULAR | Status: DC | PRN
  Administered 2022-04-20 (×2): 50 ug via INTRAVENOUS

## 2022-04-20 MED ORDER — PHENYLEPHRINE 80 MCG/ML (10ML) SYRINGE FOR IV PUSH (FOR BLOOD PRESSURE SUPPORT)
PREFILLED_SYRINGE | INTRAVENOUS | Status: DC | PRN
  Administered 2022-04-20 (×2): 200 ug via INTRAVENOUS

## 2022-04-20 MED ORDER — LIDOCAINE 2% (20 MG/ML) 5 ML SYRINGE
INTRAMUSCULAR | Status: DC | PRN
  Administered 2022-04-20: 60 mg via INTRAVENOUS

## 2022-04-20 MED ORDER — MORPHINE SULFATE (PF) 4 MG/ML IV SOLN
4.0000 mg | Freq: Once | INTRAVENOUS | Status: AC
  Administered 2022-04-20: 4 mg via INTRAVENOUS
  Filled 2022-04-20: qty 1

## 2022-04-20 MED ORDER — FENTANYL CITRATE (PF) 100 MCG/2ML IJ SOLN: 50.0000 ug | INTRAMUSCULAR | Status: DC | PRN

## 2022-04-20 SURGICAL SUPPLY — 15 items

## 2022-04-20 NOTE — Progress Notes (Signed)
[name] 6:03 PM  Subjective: Patient well-known to me from even a procedure this week and was called by the ER physician for another food impaction and she has no other complaints  Objective: Vital signs stable afebrile no acute distress exam please see preassessment evaluation  Assessment: Food impaction in patient with significant radiation stricture with recent dilation  Plan: Okay to proceed with endoscopy with anesthesia assistance  [facility] E  office [phone] After 5PM or if no answer call [phone]

## 2022-04-20 NOTE — Progress Notes (Addendum)
Pt in recovery s/p Esophagoscopy for food bolus. Pt c/o of 9/10 epigastric/flank pain post-op c/w earlier reported pain of 8/10. MDA Foster notified and ordered 50 mcg fentanyl IV push to be repeated once as needed. Medication given as ordered @ 1848. MDA Foster assessed pt at bedside @ [phone], at which time pain was continuing at 9/10, VO for the additional 50 mcg fentanyl IV push. Medication given as ordered @ 1900. Pain on assessment at 1904 was 7/10 which pt states is baseline for her. Pt's O2 saturations on room air @ 1904 were 87-90%. Started pt on oxygen at 2 LPM Page. Per pt, drawing a deep breath triggered a coughing fit which was painful. Encouraged deep breathing. Pt able to be weaned off of oxygen w/ sats >92% as of 1924. Pt's pain on reassessment 5/10 which pt states is a normal pain level for her. Recovery care complete @ 1931.  Debarah Crape, RN 04/20/22 7:31 PM

## 2022-04-20 NOTE — Discharge Instructions (Addendum)
YOU HAD AN ENDOSCOPIC PROCEDURE TODAY: Refer to the procedure report and other information in the discharge instructions given to you for any specific questions about what was found during the examination. If this information does not answer your questions, please call the [facility] office at [phone] to clarify.   YOU SHOULD EXPECT: Some feelings of bloating in the abdomen. Passage of more gas than usual. Walking can help get rid of the air that was put into your GI tract during the procedure and reduce the bloating. If you had a lower endoscopy (such as a colonoscopy or flexible sigmoidoscopy) you may notice spotting of blood in your stool or on the toilet paper. Some abdominal soreness may be present for a day or two, also.  DIET: Liquids only today and stick with soft solids and only eat ground beef or chicken or pured foods until further dilations or stenting and drink plenty of liquids and call if GI question or problem  ACTIVITY: Your care partner should take you home directly after the procedure. You should plan to take it easy, moving slowly for the rest of the day. You can resume normal activity the day after the procedure however YOU SHOULD NOT DRIVE, use power tools, machinery or perform tasks that involve climbing or major physical exertion for 24 hours (because of the sedation medicines used during the test).   SYMPTOMS TO REPORT IMMEDIATELY: A gastroenterologist can be reached at any hour. Please call [phone]  for any of the following symptoms:   Following upper endoscopy (EGD, EUS, ERCP, esophageal dilation) Vomiting of blood or coffee ground material  New, significant abdominal pain  New, significant chest pain or pain under the shoulder blades  Painful or persistently difficult swallowing  New shortness of breath  Black, tarry-looking or red, bloody stools  FOLLOW UP:  If any biopsies were taken you will be contacted by phone or by letter within the next 1-3 weeks. Call  [phone]  if you have not heard about the biopsies in 3 weeks.  Please also call with any specific questions about appointments or follow up tests.

## 2022-04-20 NOTE — Transfer of Care (Signed)
Immediate Anesthesia Transfer of Care Note  Patient: [name]  Procedure(s) Performed: ESOPHAGOSCOPY  Patient Location: PACU  Anesthesia Type:General  Level of Consciousness: awake, alert  and oriented  Airway & Oxygen Therapy: Patient Spontanous Breathing and Patient connected to face mask oxygen  Post-op Assessment: Report given to RN and Post -op Vital signs reviewed and stable  Post vital signs: Reviewed and stable  Last Vitals:  Vitals Value Taken Time  BP 135/111 04/20/22 1840  Temp    Pulse    Resp 34 04/20/22 1841  SpO2    Vitals shown include unvalidated device data.  Last Pain:  Vitals:   04/20/22 1752  TempSrc: Temporal  PainSc: 8       Patients Stated Pain Goal: 0 (14/10/30 1314)  Complications: No notable events documented.

## 2022-04-20 NOTE — Anesthesia Postprocedure Evaluation (Signed)
Anesthesia Post Note  Patient: [name]  Procedure(s) Performed: ESOPHAGOSCOPY     Patient location during evaluation: PACU Anesthesia Type: General Level of consciousness: awake and alert and oriented Pain management: pain level controlled Vital Signs Assessment: post-procedure vital signs reviewed and stable Respiratory status: spontaneous breathing, nonlabored ventilation and respiratory function stable Cardiovascular status: blood pressure returned to baseline and stable Postop Assessment: no apparent nausea or vomiting Anesthetic complications: no   No notable events documented.  Last Vitals:  Vitals:   04/20/22 1854 04/20/22 1904  BP: 121/80   Pulse: (!) 114 (!) 102  Resp: (!) 24 16  Temp:    SpO2: 91% (!) 89%    Last Pain:  Vitals:   04/20/22 1854  TempSrc:   PainSc: 9                  Lavonn Maxcy A.

## 2022-04-20 NOTE — ED Provider Notes (Signed)
[facility] DEPT Provider Note   CSN: [id number] Arrival date & time: 04/20/22  1603     History  Chief Complaint  Patient presents with   Dysphagia    [name] is a 51 y.o. female.  The history is provided by medical records and the patient. No language interpreter was used.  Sore Throat This is a recurrent problem. The current episode started 3 to 5 hours ago. The problem occurs constantly. The problem has not changed since onset.Pertinent negatives include no chest pain, no abdominal pain, no headaches and no shortness of breath. Nothing aggravates the symptoms. Nothing relieves the symptoms. She has tried nothing for the symptoms. The treatment provided no relief.      Home Medications Prior to Admission medications   Medication Sig Start Date End Date Taking? Authorizing Provider  albuterol (VENTOLIN HFA) 108 (90 Base) MCG/ACT inhaler Inhale 2 puffs into the lungs every 6 (six) hours as needed for wheezing or shortness of breath. 02/08/22   Mercy Riding, MD  apixaban (ELIQUIS) 5 MG TABS tablet Take 1 tablet (5 mg total) by mouth 2 (two) times daily. Patient not taking: Reported on 04/04/2022 03/18/22   Aline August, MD  diphenhydramine-acetaminophen (TYLENOL PM) 25-500 MG TABS tablet Take 3 tablets by mouth at bedtime as needed (for sleep).    [provider]  folic acid (FOLVITE) 1 MG tablet Take 1 tablet (1 mg total) by mouth daily. Patient not taking: Reported on 03/03/2022 02/09/22   Mercy Riding, MD  Multiple Vitamin (MULTIVITAMIN) LIQD Take 15 mLs by mouth daily. Patient not taking: Reported on 03/03/2022 02/08/22   Mercy Riding, MD  nicotine (NICODERM CQ - DOSED IN MG/24 HOURS) 21 mg/24hr patch Place 1 patch (21 mg total) onto the skin daily. Patient not taking: Reported on 04/04/2022 03/07/22   Oswald Hillock, MD  ondansetron (ZOFRAN-ODT) 8 MG disintegrating tablet Dissolve 1 tablet (8 mg total) by mouth every 8 (eight) hours as  needed for nausea or vomiting. Patient not taking: Reported on 04/04/2022 02/08/22   Mercy Riding, MD  oxyCODONE (OXY IR/ROXICODONE) 5 MG immediate release tablet Take 1 tablet (5 mg total) by mouth every 6 (six) hours as needed for severe pain. Patient not taking: Reported on 04/04/2022 03/07/22   Oswald Hillock, MD  pantoprazole (PROTONIX) 40 MG tablet Take 1 tablet (40 mg total) by mouth 2 (two) times daily. Patient not taking: Reported on 03/13/2022 03/07/22 06/05/22  Oswald Hillock, MD  potassium chloride (KLOR-CON) 20 MEQ packet Dilute the contents of 1 packet in 4 oz of water and drink by mouth 2  times daily for 5 days. 04/18/22 04/23/22  Pattricia Boss, MD  sucralfate (CARAFATE) 1 GM/10ML suspension Take 10 mLs (1 g total) by mouth 4 (four) times daily -  with meals and at bedtime. Patient not taking: Reported on 04/04/2022 03/07/22   Oswald Hillock, MD  tiotropium (SPIRIVA HANDIHALER) 18 MCG inhalation capsule Place 1 capsule (18 mcg total) into inhaler and inhale daily. Patient not taking: Reported on 03/03/2022 02/08/22 05/09/22  Mercy Riding, MD      Allergies    Compazine [prochlorperazine], Naproxen, and Bactrim [sulfamethoxazole-trimethoprim]    Review of Systems   Review of Systems  Constitutional:  Negative for chills and fatigue.  HENT:  Positive for drooling (cannot swallow saliva) and trouble swallowing. Negative for congestion.   Respiratory:  Negative for chest tightness, shortness of breath, wheezing and stridor.  Cardiovascular:  Negative for chest pain and palpitations.  Gastrointestinal:  Negative for abdominal pain, constipation, diarrhea, nausea and vomiting.  Musculoskeletal:  Negative for back pain, neck pain and neck stiffness.  Skin:  Negative for rash and wound.  Neurological:  Negative for headaches.  Psychiatric/Behavioral:  Negative for agitation and confusion.    Physical Exam Updated Vital Signs BP (!) 130/98 (BP Location: Right Arm)   Pulse (!) 120   Temp 98.4  F (36.9 C) (Oral)   Resp 20   Ht 5\' 8"  (1.727 m)   Wt 58.5 kg   LMP 04/24/2018   SpO2 97%   BMI 19.61 kg/m  Physical Exam Vitals and nursing note reviewed.  Constitutional:      General: She is not in acute distress.    Appearance: She is well-developed. She is not ill-appearing, toxic-appearing or diaphoretic.  HENT:     Head: Normocephalic and atraumatic.     Nose: No congestion.     Mouth/Throat:     Mouth: Mucous membranes are moist.     Pharynx: No oropharyngeal exudate or posterior oropharyngeal erythema.  Eyes:     Extraocular Movements: Extraocular movements intact.     Conjunctiva/sclera: Conjunctivae normal.     Pupils: Pupils are equal, round, and reactive to light.  Cardiovascular:     Rate and Rhythm: Regular rhythm. Tachycardia present.     Heart sounds: No murmur heard. Pulmonary:     Effort: Pulmonary effort is normal. No respiratory distress.     Breath sounds: Normal breath sounds. No stridor. No wheezing, rhonchi or rales.  Chest:     Chest wall: No tenderness.  Abdominal:     Palpations: Abdomen is soft.     Tenderness: There is no abdominal tenderness.  Musculoskeletal:        General: No swelling.     Cervical back: Neck supple. No tenderness.  Skin:    General: Skin is warm and dry.     Capillary Refill: Capillary refill takes less than 2 seconds.  Neurological:     Mental Status: She is alert.  Psychiatric:        Mood and Affect: Mood normal.    ED Results / Procedures / Treatments   Labs (all labs ordered are listed, but only abnormal results are displayed) Labs Reviewed  CBC WITH DIFFERENTIAL/PLATELET - Abnormal; Notable for the following components:      Result Value   RBC 3.20 (*)    Hemoglobin 11.5 (*)    HCT 33.6 (*)    MCV 105.0 (*)    MCH 35.9 (*)    All other components within normal limits  COMPREHENSIVE METABOLIC PANEL - Abnormal; Notable for the following components:   Potassium 2.9 (*)    All other components within  normal limits    EKG None  Radiology No results found.  Procedures Procedures    Medications Ordered in ED Medications  0.9 %  sodium chloride infusion (has no administration in time range)  fentaNYL (SUBLIMAZE) injection 50 mcg (has no administration in time range)  morphine (PF) 4 MG/ML injection 4 mg (4 mg Intravenous Given 04/20/22 1642)  fentaNYL (SUBLIMAZE) 50 MCG/ML injection (50 mcg Intravenous Given 04/20/22 1848)  fentaNYL (SUBLIMAZE) 50 MCG/ML injection (50 mcg Intravenous Given 04/20/22 1900)    ED Course/ Medical Decision Making/ A&P  Medical Decision Making Amount and/or Complexity of Data Reviewed Labs: ordered.  Risk Prescription drug management.    [name] is a 51 y.o. 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  Patient reports that she just had a dilation and evaluation with EGD 2 days ago when she had what she thought was a piece of aspirin stuck in her throat.  She was seen by Dr. Watt Climes with [facility] who did the procedure.  She says that she had done well after the procedure including this morning however around 11 AM, she decided to eat a ribeye steak and immediately felt like the first bite got stuck.  She has not been able to tolerate any food or drink and is not tolerating her own secretions.  She is spitting into a emesis bag.  She is denying any shortness of breath or aspiration but she does have a mild cough.  She denies fevers or chills.  She denies any abdominal pain or other chest pain aside from the pain in her sternal notch area where she typically has pain with food bolus.  She denies any other complaints today.  On exam.  I do not appreciate stridor.  Lungs were clear.  Chest was nontender.   Oropharyngeal exam was unremarkable.  Given her recent esophageal procedure 2 days ago and the concern for the first bite of steak getting stuck I do suspect she has a food bolus of steak stuck in her esophagus.  We will call [facility] and discussed the plan.  We will get some screening labs.  4:42 PM Just spoke to Dr. Watt Climes with [facility].  He will come see the patient.   Patient will go to endoscopy suite for food bolus removal.  Anticipate discharge tonight by GI.        Final Clinical Impression(s) / ED Diagnoses Final diagnoses:  Food impaction of esophagus, initial encounter    Clinical Impression: 1. Food impaction of esophagus, initial encounter     Disposition: To endoscopy suite with GI.  This note was prepared with assistance of Systems analyst. Occasional wrong-word or sound-a-like substitutions may have occurred due to the inherent limitations of voice recognition software.      [place] Blizzard, Gwenyth Allegra, MD 04/20/22 2245

## 2022-04-20 NOTE — ED Triage Notes (Signed)
Pt states she has something stuck in her throat from this morning, can not swallow secretions. Seen on Thursday for same and Endo procedure done to stretch esophagus.

## 2022-04-20 NOTE — Op Note (Signed)
[facility] Patient Name: [name] Procedure Date: 04/20/2022 MRN: [id number] Attending MD: Clarene Essex , MD Date of Birth: Nov 08, 1971 CSN: [id number] Age: 51 Admit Type: Inpatient Procedure:                Upper GI endoscopy Indications:              Dysphagia, Foreign body in the esophagus Providers:                Clarene Essex, MD, Dulcy Fanny, Luan Moore,                            Technician, Adair Laundry, CRNA Referring MD:              Medicines:                General Anesthesia Complications:            No immediate complications. Estimated Blood Loss:     Estimated blood loss: none. Procedure:                Pre-Anesthesia Assessment:                           - Prior to the procedure, a History and Physical                            was performed, and patient medications and                            allergies were reviewed. The patient's tolerance of                            previous anesthesia was also reviewed. The risks                            and benefits of the procedure and the sedation                            options and risks were discussed with the patient.                            All questions were answered, and informed consent                            was obtained. Prior Anticoagulants: The patient has                            taken no previous anticoagulant or antiplatelet                            agents. ASA Grade Assessment: III - A patient with                            severe systemic disease. After reviewing the risks  and benefits, the patient was deemed in                            satisfactory condition to undergo the procedure.                           After obtaining informed consent, the endoscope was                            passed under direct vision. Throughout the                            procedure, the patient's blood pressure, pulse, and                             oxygen saturations were monitored continuously. The                            [id number]-H190 ([id number]) Olympus endoscope was introduced                            through the mouth, and advanced to the body of the                            stomach. The upper GI endoscopy was accomplished                            without difficulty. The patient tolerated the                            procedure well. The patient tolerated the procedure                            well. Scope In: Scope Out: Findings:      Food was found in the upper third of the esophagus. Removal of food was       accomplished by inflating the esophagus with air and it fell on its own       into the distal esophagus and was gently pushed into the stomach.      One benign-appearing, intrinsic moderate stenosis was found in the upper       third of the esophagus. The stenosis was traversed. There was some       ulceration and inflammation around      Food (residue) was found on the greater curvature of the stomach. It was       solid and not well chewed and a surprisingly large piece      The exam was otherwise without abnormality. Impression:               - Food in the upper third of the esophagus. Removal                            was successful.                           -  Benign-appearing esophageal stenosis. With                            ulceration and inflammation                           - Food (residue) in the stomach.                           - The examination was otherwise normal. Moderate Sedation:      Not Applicable - Patient had care per Anesthesia. Recommendation:           - Patient has a contact number available for                            emergencies. The signs and symptoms of potential                            delayed complications were discussed with the                            patient. Return to normal activities tomorrow.                            Written discharge instructions were  provided to the                            patient.                           - Full liquid diet today. And only eat soft solids                            drink plenty of liquids and only eat meats that are                            ground chicken or beef                           - Continue present medications.                           - Return to GI clinic PRN.                           - Telephone GI clinic if symptomatic PRN. Procedure Code(s):        --- Professional ---                           [phone], 75, Esophagogastroduodenoscopy, flexible,                            transoral; with removal of foreign body(s) Diagnosis Code(s):        --- Professional ---  Q30.092Z, Food in esophagus causing other injury,                            initial encounter                           K22.2, Esophageal obstruction                           T18.2XXA, Foreign body in stomach, initial encounter                           R13.10, Dysphagia, unspecified                           T18.108A, Unspecified foreign body in esophagus                            causing other injury, initial encounter CPT copyright 2019 [employer]. All rights reserved. The codes documented in this report are preliminary and upon coder review may  be revised to meet current compliance requirements. Clarene Essex, MD 04/20/2022 6:47:39 PM This report has been signed electronically. Number of Addenda: 0

## 2022-04-20 NOTE — Anesthesia Procedure Notes (Signed)
Procedure Name: Intubation Date/Time: 04/20/2022 6:20 PM Performed by: Montel Clock, CRNA Pre-anesthesia Checklist: Patient identified, Emergency Drugs available, Suction available, Patient being monitored and Timeout performed Patient Re-evaluated:Patient Re-evaluated prior to induction Oxygen Delivery Method: Circle system utilized Preoxygenation: Pre-oxygenation with 100% oxygen Induction Type: IV induction, Rapid sequence and Cricoid Pressure applied Laryngoscope Size: Mac and 3 Grade View: Grade I Tube type: Oral Tube size: 7.0 mm Number of attempts: 1 Airway Equipment and Method: Stylet Placement Confirmation: ETT inserted through vocal cords under direct vision, positive ETCO2 and breath sounds checked- equal and bilateral Secured at: 21 cm Tube secured with: Tape Dental Injury: Teeth and Oropharynx as per pre-operative assessment

## 2022-04-20 NOTE — Anesthesia Preprocedure Evaluation (Addendum)
Anesthesia Evaluation  Patient identified by MRN, date of birth, ID band Patient awake    Reviewed: Allergy & Precautions, NPO status , Patient's Chart, lab work & pertinent test results  Airway Mallampati: II  TM Distance: >3 FB Neck ROM: Full    Dental  (+) Partial Upper, Missing, Dental Advisory Given,    Pulmonary asthma , pneumonia, resolved, COPD,  COPD inhaler, Current SmokerPatient did not abstain from smoking.,  Hx/o Lung Ca S/P RT   Pulmonary exam normal breath sounds clear to auscultation       Cardiovascular negative cardio ROS Normal cardiovascular exam Rhythm:Regular Rate:Normal     Neuro/Psych PSYCHIATRIC DISORDERS Anxiety Depression  Neuromuscular disease    GI/Hepatic Neg liver ROS, Hx/o esophageal stricture S/P balloon dilation Food impaction   Endo/Other  negative endocrine ROS  Renal/GU Renal disease  negative genitourinary   Musculoskeletal  (+) Arthritis , Osteoarthritis,    Abdominal   Peds  Hematology  (+) Blood dyscrasia, anemia ,   Anesthesia Other Findings   Reproductive/Obstetrics                           Anesthesia Physical Anesthesia Plan  ASA: 3 and emergent  Anesthesia Plan: General   Post-op Pain Management: Minimal or no pain anticipated   Induction: Intravenous, Rapid sequence and Cricoid pressure planned  PONV Risk Score and Plan: 1 and Ondansetron and Treatment may vary due to age or medical condition  Airway Management Planned: Oral ETT  Additional Equipment: None  Intra-op Plan:   Post-operative Plan: Extubation in OR  Informed Consent: I have reviewed the patients History and Physical, chart, labs and discussed the procedure including the risks, benefits and alternatives for the proposed anesthesia with the patient or authorized representative who has indicated his/her understanding and acceptance.     Dental advisory given  Plan  Discussed with: CRNA and Anesthesiologist  Anesthesia Plan Comments:        Anesthesia Quick Evaluation

## 2022-04-22 ENCOUNTER — Encounter (HOSPITAL_COMMUNITY): Payer: Self-pay | Admitting: Gastroenterology

## 2022-04-23 ENCOUNTER — Encounter (HOSPITAL_COMMUNITY): Payer: Self-pay | Admitting: Gastroenterology

## 2022-04-25 ENCOUNTER — Emergency Department (HOSPITAL_COMMUNITY): Payer: Medicaid Other

## 2022-04-25 ENCOUNTER — Encounter (HOSPITAL_COMMUNITY): Payer: Self-pay

## 2022-04-25 ENCOUNTER — Other Ambulatory Visit (HOSPITAL_COMMUNITY): Payer: Self-pay

## 2022-04-25 ENCOUNTER — Encounter (HOSPITAL_COMMUNITY)
Admission: EM | Disposition: A | Discharge status: Home / Self Care | Payer: Self-pay | Source: Home / Self Care | Attending: Emergency Medicine

## 2022-04-25 ENCOUNTER — Emergency Department (HOSPITAL_COMMUNITY): Payer: Medicaid Other | Admitting: Certified Registered"

## 2022-04-25 ENCOUNTER — Emergency Department (EMERGENCY_DEPARTMENT_HOSPITAL): Payer: Medicaid Other | Admitting: Certified Registered"

## 2022-04-25 ENCOUNTER — Other Ambulatory Visit: Payer: Self-pay

## 2022-04-25 ENCOUNTER — Ambulatory Visit (HOSPITAL_COMMUNITY)
Admission: EM | Admit: 2022-04-25 | Discharge: 2022-04-25 | Disposition: A | Discharge status: Home / Self Care | Payer: Medicaid Other | Attending: Emergency Medicine | Admitting: Emergency Medicine

## 2022-04-25 DIAGNOSIS — F17210 Nicotine dependence, cigarettes, uncomplicated: Secondary | ICD-10-CM | POA: Diagnosis not present

## 2022-04-25 DIAGNOSIS — T18128A Food in esophagus causing other injury, initial encounter: Secondary | ICD-10-CM | POA: Insufficient documentation

## 2022-04-25 DIAGNOSIS — J449 Chronic obstructive pulmonary disease, unspecified: Secondary | ICD-10-CM | POA: Insufficient documentation

## 2022-04-25 DIAGNOSIS — Z85118 Personal history of other malignant neoplasm of bronchus and lung: Secondary | ICD-10-CM | POA: Diagnosis not present

## 2022-04-25 DIAGNOSIS — K2970 Gastritis, unspecified, without bleeding: Secondary | ICD-10-CM | POA: Insufficient documentation

## 2022-04-25 DIAGNOSIS — Z853 Personal history of malignant neoplasm of breast: Secondary | ICD-10-CM | POA: Insufficient documentation

## 2022-04-25 DIAGNOSIS — K222 Esophageal obstruction: Secondary | ICD-10-CM

## 2022-04-25 DIAGNOSIS — Z9049 Acquired absence of other specified parts of digestive tract: Secondary | ICD-10-CM | POA: Diagnosis not present

## 2022-04-25 DIAGNOSIS — X58XXXA Exposure to other specified factors, initial encounter: Secondary | ICD-10-CM | POA: Diagnosis not present

## 2022-04-25 DIAGNOSIS — Z923 Personal history of irradiation: Secondary | ICD-10-CM | POA: Diagnosis not present

## 2022-04-25 DIAGNOSIS — T18108A Unspecified foreign body in esophagus causing other injury, initial encounter: Secondary | ICD-10-CM

## 2022-04-25 HISTORY — PX: FOREIGN BODY REMOVAL: SHX962

## 2022-04-25 HISTORY — PX: ESOPHAGOGASTRODUODENOSCOPY (EGD) WITH PROPOFOL: SHX5813

## 2022-04-25 SURGERY — ESOPHAGOGASTRODUODENOSCOPY (EGD) WITH PROPOFOL
Anesthesia: General

## 2022-04-25 MED ORDER — ONDANSETRON HCL 4 MG/2ML IJ SOLN
INTRAMUSCULAR | Status: DC | PRN
  Administered 2022-04-25: 4 mg via INTRAVENOUS

## 2022-04-25 MED ORDER — FENTANYL CITRATE (PF) 100 MCG/2ML IJ SOLN
INTRAMUSCULAR | Status: AC
  Filled 2022-04-25: qty 2

## 2022-04-25 MED ORDER — SUCCINYLCHOLINE CHLORIDE 200 MG/10ML IV SOSY
PREFILLED_SYRINGE | INTRAVENOUS | Status: DC | PRN
  Administered 2022-04-25: 100 mg via INTRAVENOUS

## 2022-04-25 MED ORDER — PROPOFOL 10 MG/ML IV BOLUS
INTRAVENOUS | Status: AC
Start: 2022-04-25 — End: ?
  Filled 2022-04-25: qty 20

## 2022-04-25 MED ORDER — LIDOCAINE 2% (20 MG/ML) 5 ML SYRINGE
INTRAMUSCULAR | Status: DC | PRN
  Administered 2022-04-25: 40 mg via INTRAVENOUS

## 2022-04-25 MED ORDER — FENTANYL CITRATE PF 50 MCG/ML IJ SOSY: 50.0000 ug | PREFILLED_SYRINGE | Freq: Once | INTRAMUSCULAR | Status: DC

## 2022-04-25 MED ORDER — PROPOFOL 10 MG/ML IV BOLUS
INTRAVENOUS | Status: DC | PRN
  Administered 2022-04-25: 180 mg via INTRAVENOUS

## 2022-04-25 MED ORDER — GLUCAGON HCL RDNA (DIAGNOSTIC) 1 MG IJ SOLR: 2.0000 mg | Freq: Once | INTRAMUSCULAR | Status: DC

## 2022-04-25 MED ORDER — SODIUM CHLORIDE 0.9 % IV BOLUS: 1000.0000 mL | Freq: Once | INTRAVENOUS | Status: DC

## 2022-04-25 MED ORDER — FENTANYL CITRATE (PF) 100 MCG/2ML IJ SOLN
INTRAMUSCULAR | Status: DC | PRN
  Administered 2022-04-25 (×2): 50 ug via INTRAVENOUS

## 2022-04-25 MED ORDER — DEXAMETHASONE SODIUM PHOSPHATE 10 MG/ML IJ SOLN
INTRAMUSCULAR | Status: DC | PRN
  Administered 2022-04-25: 4 mg via INTRAVENOUS

## 2022-04-25 MED ORDER — SODIUM CHLORIDE 0.9 % IV SOLN: INTRAVENOUS | Status: DC

## 2022-04-25 MED ORDER — PANTOPRAZOLE SODIUM 40 MG PO TBEC
40.0000 mg | DELAYED_RELEASE_TABLET | Freq: Every day | ORAL | 2 refills | Status: DC
  Filled 2022-04-25: qty 30, 30d supply, fill #0

## 2022-04-25 SURGICAL SUPPLY — 15 items

## 2022-04-25 NOTE — Anesthesia Procedure Notes (Signed)
Procedure Name: Intubation Date/Time: 04/25/2022 3:16 PM Performed by: Niel Hummer, CRNA Pre-anesthesia Checklist: Patient identified, Emergency Drugs available, Suction available and Patient being monitored Patient Re-evaluated:Patient Re-evaluated prior to induction Oxygen Delivery Method: Circle system utilized Preoxygenation: Pre-oxygenation with 100% oxygen Induction Type: IV induction and Rapid sequence Laryngoscope Size: Mac and 4 Grade View: Grade I Tube type: Oral Tube size: 7.0 mm Number of attempts: 1 Airway Equipment and Method: Stylet Placement Confirmation: ETT inserted through vocal cords under direct vision, positive ETCO2 and breath sounds checked- equal and bilateral Secured at: 22 cm Tube secured with: Tape Dental Injury: Teeth and Oropharynx as per pre-operative assessment

## 2022-04-25 NOTE — Discharge Instructions (Signed)

## 2022-04-25 NOTE — Consult Note (Signed)
Referring Provider:  EDP Primary Care Physician:  [name], PA-C Primary Gastroenterologist:  Sadie Haber  Reason for Consultation: Food impaction  HPI: [name] is a 51 y.o. female with past medical history of breast cancer s/p radiation, history of radiation-induced esophageal stricture, history of multiple food impaction requiring multiple EGDs presented to the hospital again with food impaction after eating ham.  She is now complaining of substernal chest pain and inability to tolerate oral intake.  Patient has been told multiple times to avoid solid food because of her tight esophageal stricture.  Past Medical History:  Diagnosis Date   Ankle sprain    left   Anxiety    treated for panic attacks in the past.   Asthma    COPD (chronic obstructive pulmonary disease) ([place])    Depression    History of radiation therapy    Right lung- 09/26/21-11/13/21- Dr. Gery Pray   Irregular heart rate    Lung cancer [facility])    Renal disorder    kidney infections    Past Surgical History:  Procedure Laterality Date   APPENDECTOMY     BALLOON DILATION N/A 03/15/2022   Procedure: BALLOON DILATION;  Surgeon: Ronnette Juniper, MD;  Location: [facility] ENDOSCOPY;  Service: Gastroenterology;  Laterality: N/A;   BRONCHIAL BIOPSY  09/21/2021   Procedure: BRONCHIAL BIOPSIES;  Surgeon: Collene Gobble, MD;  Location: [facility] ENDOSCOPY;  Service: Pulmonary;;   BRONCHIAL BRUSHINGS  09/21/2021   Procedure: BRONCHIAL BRUSHINGS;  Surgeon: Collene Gobble, MD;  Location: [facility] ENDOSCOPY;  Service: Pulmonary;;   BRONCHIAL NEEDLE ASPIRATION BIOPSY  09/21/2021   Procedure: BRONCHIAL NEEDLE ASPIRATION BIOPSIES;  Surgeon: Collene Gobble, MD;  Location: [place] ENDOSCOPY;  Service: Pulmonary;;   ESOPHAGEAL STENT PLACEMENT N/A 03/06/2022   Procedure: ESOPHAGEAL STENT PLACEMENT;  Surgeon: Clarene Essex, MD;  Location: [facility] ENDOSCOPY;  Service: Gastroenterology;  Laterality: N/A;   ESOPHAGOGASTRODUODENOSCOPY N/A 12/26/2021    Procedure: ESOPHAGOGASTRODUODENOSCOPY (EGD);  Surgeon: Clarene Essex, MD;  Location: [facility] ENDOSCOPY;  Service: Endoscopy;  Laterality: N/A;   ESOPHAGOGASTRODUODENOSCOPY N/A 02/11/2022   Procedure: ESOPHAGOGASTRODUODENOSCOPY (EGD);  Surgeon: Arta Silence, MD;  Location: [facility] ENDOSCOPY;  Service: Gastroenterology;  Laterality: N/A;   ESOPHAGOGASTRODUODENOSCOPY N/A 03/03/2022   Procedure: ESOPHAGOGASTRODUODENOSCOPY (EGD);  Surgeon: Wilford Corner, MD;  Location: [facility] ENDOSCOPY;  Service: Gastroenterology;  Laterality: N/A;   ESOPHAGOGASTRODUODENOSCOPY N/A 03/06/2022   Procedure: ESOPHAGOGASTRODUODENOSCOPY (EGD);  Surgeon: Clarene Essex, MD;  Location: [facility] ENDOSCOPY;  Service: Gastroenterology;  Laterality: N/A;  With esophageal stent placement   ESOPHAGOGASTRODUODENOSCOPY N/A 03/15/2022   Procedure: ESOPHAGOGASTRODUODENOSCOPY (EGD);  Surgeon: Ronnette Juniper, MD;  Location: [facility] ENDOSCOPY;  Service: Gastroenterology;  Laterality: N/A;  with dilation   ESOPHAGOGASTRODUODENOSCOPY N/A 04/18/2022   Procedure: ESOPHAGOGASTRODUODENOSCOPY (EGD);  Surgeon: Clarene Essex, MD;  Location: [facility] ENDOSCOPY;  Service: Gastroenterology;  Laterality: N/A;   ESOPHAGOGASTRODUODENOSCOPY (EGD) WITH PROPOFOL N/A 01/07/2022   Procedure: ESOPHAGOGASTRODUODENOSCOPY (EGD) WITH PROPOFOL;  Surgeon: Otis Brace, MD;  Location: [facility] ENDOSCOPY;  Service: Gastroenterology;  Laterality: N/A;   ESOPHAGOGASTRODUODENOSCOPY (EGD) WITH PROPOFOL N/A 02/02/2022   Procedure: ESOPHAGOGASTRODUODENOSCOPY (EGD) WITH PROPOFOL;  Surgeon: Ronnette Juniper, MD;  Location: [facility] ENDOSCOPY;  Service: Gastroenterology;  Laterality: N/A;   ESOPHAGOGASTRODUODENOSCOPY (EGD) WITH PROPOFOL N/A 02/04/2022   Procedure: ESOPHAGOGASTRODUODENOSCOPY (EGD) WITH PROPOFOL;  Surgeon: Otis Brace, MD;  Location: [facility] ENDOSCOPY;  Service: Gastroenterology;  Laterality: N/A;   ESOPHAGOGASTRODUODENOSCOPY (EGD) WITH PROPOFOL N/A 03/13/2022   Procedure: ESOPHAGOGASTRODUODENOSCOPY (EGD) WITH  PROPOFOL;  Surgeon: Clarene Essex, MD;  Location: [facility] ENDOSCOPY;  Service: Gastroenterology;  Laterality: N/A;   ESOPHAGOSCOPY N/A 04/20/2022   Procedure: ESOPHAGOSCOPY;  Surgeon: Clarene Essex, MD;  Location: [facility] ENDOSCOPY;  Service: Gastroenterology;  Laterality: N/A;   FOREIGN BODY REMOVAL  12/26/2021   Procedure: FOREIGN BODY REMOVAL;  Surgeon: Clarene Essex, MD;  Location: [facility] ENDOSCOPY;  Service: Endoscopy;;   FOREIGN BODY REMOVAL  02/02/2022   Procedure: FOREIGN BODY REMOVAL;  Surgeon: Ronnette Juniper, MD;  Location: [facility] ENDOSCOPY;  Service: Gastroenterology;;   FOREIGN BODY REMOVAL  02/11/2022   Procedure: FOREIGN BODY REMOVAL;  Surgeon: Arta Silence, MD;  Location: [facility] ENDOSCOPY;  Service: Gastroenterology;;   FOREIGN BODY REMOVAL N/A 03/03/2022   Procedure: FOREIGN BODY REMOVAL;  Surgeon: Wilford Corner, MD;  Location: [facility] ENDOSCOPY;  Service: Gastroenterology;  Laterality: N/A;   FOREIGN BODY REMOVAL  04/18/2022   Procedure: FOREIGN BODY REMOVAL;  Surgeon: Clarene Essex, MD;  Location: [facility] ENDOSCOPY;  Service: Gastroenterology;;   FOREIGN BODY REMOVAL  04/20/2022   Procedure: FOREIGN BODY REMOVAL;  Surgeon: Clarene Essex, MD;  Location: [facility] ENDOSCOPY;  Service: Gastroenterology;;  Food Impaction   IR CHEST FLUORO  12/27/2021   LUMBAR LAMINECTOMY N/A 05/13/2018   Procedure: LEFT LUMBAR FOUR-FIVE MICRODISCECTOMY;  Surgeon: Marybelle Killings, MD;  Location: [place];  Service: Orthopedics;  Laterality: N/A;   STENT REMOVAL  03/13/2022   Procedure: STENT REMOVAL;  Surgeon: Clarene Essex, MD;  Location: [facility] ENDOSCOPY;  Service: Gastroenterology;;   TUBAL LIGATION     TUBAL LIGATION  1998   VIDEO BRONCHOSCOPY WITH ENDOBRONCHIAL ULTRASOUND N/A 09/21/2021   Procedure: VIDEO BRONCHOSCOPY WITH ENDOBRONCHIAL ULTRASOUND;  Surgeon: Collene Gobble, MD;  Location: [place] ENDOSCOPY;  Service: Pulmonary;  Laterality: N/A;    Prior to Admission medications   Medication Sig Start Date End Date Taking? Authorizing Provider  albuterol  (VENTOLIN HFA) 108 (90 Base) MCG/ACT inhaler Inhale 2 puffs into the lungs every 6 (six) hours as needed for wheezing or shortness of breath. 02/08/22   Mercy Riding, MD  apixaban (ELIQUIS) 5 MG TABS tablet Take 1 tablet (5 mg total) by mouth 2 (two) times daily. Patient not taking: Reported on 04/04/2022 03/18/22   Aline August, MD  diphenhydramine-acetaminophen (TYLENOL PM) 25-500 MG TABS tablet Take 3 tablets by mouth at bedtime as needed (for sleep).    [provider]  folic acid (FOLVITE) 1 MG tablet Take 1 tablet (1 mg total) by mouth daily. Patient not taking: Reported on 03/03/2022 02/09/22   Mercy Riding, MD  Multiple Vitamin (MULTIVITAMIN) LIQD Take 15 mLs by mouth daily. Patient not taking: Reported on 03/03/2022 02/08/22   Mercy Riding, MD  nicotine (NICODERM CQ - DOSED IN MG/24 HOURS) 21 mg/24hr patch Place 1 patch (21 mg total) onto the skin daily. Patient not taking: Reported on 04/04/2022 03/07/22   Oswald Hillock, MD  ondansetron (ZOFRAN-ODT) 8 MG disintegrating tablet Dissolve 1 tablet (8 mg total) by mouth every 8 (eight) hours as needed for nausea or vomiting. Patient not taking: Reported on 04/04/2022 02/08/22   Mercy Riding, MD  oxyCODONE (OXY IR/ROXICODONE) 5 MG immediate release tablet Take 1 tablet (5 mg total) by mouth every 6 (six) hours as needed for severe pain. Patient not taking: Reported on 04/04/2022 03/07/22   Oswald Hillock, MD  pantoprazole (PROTONIX) 40 MG tablet Take 1 tablet (40 mg total) by mouth 2 (two) times daily. Patient not taking: Reported on 03/13/2022 03/07/22 06/05/22  Oswald Hillock, MD  potassium chloride (KLOR-CON) 20 MEQ packet Dilute the  contents of 1 packet in 4 oz of water and drink by mouth 2  times daily for 5 days. 04/18/22 04/23/22  Pattricia Boss, MD  sucralfate (CARAFATE) 1 GM/10ML suspension Take 10 mLs (1 g total) by mouth 4 (four) times daily -  with meals and at bedtime. Patient not taking: Reported on 04/04/2022 03/07/22   Oswald Hillock, MD   tiotropium (SPIRIVA HANDIHALER) 18 MCG inhalation capsule Place 1 capsule (18 mcg total) into inhaler and inhale daily. Patient not taking: Reported on 03/03/2022 02/08/22 05/09/22  Mercy Riding, MD    Scheduled Meds:  fentaNYL (SUBLIMAZE) injection  50 mcg Intravenous Once   glucagon (human recombinant)  2 mg Intravenous Once   Continuous Infusions:  sodium chloride     PRN Meds:.  Allergies as of 04/25/2022 - Review Complete 04/25/2022  Allergen Reaction Noted   Compazine [prochlorperazine] Other (See Comments) 03/16/2022   Naproxen Nausea Only 09/10/2021   Bactrim [sulfamethoxazole-trimethoprim] Nausea Only 04/06/2012    Family History  Problem Relation Age of Onset   Hypertension Mother    Hypertension Father    Heart disease Father     Social History   Socioeconomic History   Marital status: Divorced    Spouse name: Not on file   Number of children: Not on file   Years of education: Not on file   Highest education level: Not on file  Occupational History   Not on file  Tobacco Use   Smoking status: Every Day    Packs/day: 0.50    Years: 30.00    Pack years: 15.00    Types: Cigarettes   Smokeless tobacco: Never  Vaping Use   Vaping Use: Some days   Substances: Flavoring  Substance and Sexual Activity   Alcohol use: Not Currently    Comment: rare   Drug use: Not Currently    Types: Marijuana    Comment: occ-   Sexual activity: Not on file  Other Topics Concern   Not on file  Social History Narrative   Not on file   Social Determinants of Health   Financial Resource Strain: Not on file  Food Insecurity: Not on file  Transportation Needs: Not on file  Physical Activity: Not on file  Stress: Not on file  Social Connections: Not on file  Intimate Partner Violence: Not on file    Review of Systems: All negative except as stated above in HPI.  Physical Exam: Vital signs: Vitals:   04/25/22 1226  BP: (!) 126/93  Pulse: (!) 109  Resp: 16  Temp:  98.2 F (36.8 C)  SpO2: 100%     General:   Alert,  Well-developed, well-nourished, pleasant and cooperative in NAD Lungs:  Clear throughout to auscultation.   No wheezes, crackles, or rhonchi. No acute distress. Heart:  Regular rate and rhythm; no murmurs, clicks, rubs,  or gallops. Abdomen: Soft, nontender, nondistended, bowel sounds present.  No peritoneal signs Rectal:  Deferred  GI:  Lab Results: No results for input(s): WBC, HGB, HCT, PLT in the last 72 hours. BMET No results for input(s): NA, K, CL, CO2, GLUCOSE, BUN, CREATININE, CALCIUM in the last 72 hours. LFT No results for input(s): PROT, ALBUMIN, AST, ALT, ALKPHOS, BILITOT, BILIDIR, IBILI in the last 72 hours. PT/INR No results for input(s): LABPROT, INR in the last 72 hours.   Studies/Results: No results found.  Impression/Plan: -Food impaction -History of radiation-induced upper esophageal stricture -Breast cancer s/p radiation  Recommendations ------------------------- -Plan for EGD  with food bolus removal today -Patient was advised again not to eat solid food because of her severe esophageal stricture and frequent food impaction.  Risks (bleeding, infection, bowel perforation that could require surgery, sedation-related changes in cardiopulmonary systems), benefits (identification and possible treatment of source of symptoms, exclusion of certain causes of symptoms), and alternatives (watchful waiting, radiographic imaging studies, empiric medical treatment)  were explained to patient/family in detail and patient wishes to proceed.    LOS: 0 days   Otis Brace  MD, FACP 04/25/2022, 2:30 PM  Contact #  [phone]

## 2022-04-25 NOTE — Anesthesia Preprocedure Evaluation (Addendum)
Anesthesia Evaluation  Patient identified by MRN, date of birth, ID band Patient awake    Reviewed: Allergy & Precautions, Patient's Chart, lab work & pertinent test results  History of Anesthesia Complications Negative for: history of anesthetic complications  Airway Mallampati: II  TM Distance: >3 FB Neck ROM: Full    Dental  (+) Partial Upper, Missing,    Pulmonary asthma , pneumonia, resolved, COPD,  COPD inhaler, Current SmokerPatient did not abstain from smoking.,  Hx/o Lung Ca S/P RT   Pulmonary exam normal        Cardiovascular negative cardio ROS   Rhythm:Regular Rate:Normal     Neuro/Psych PSYCHIATRIC DISORDERS Anxiety Depression  Neuromuscular disease    GI/Hepatic Neg liver ROS, Hx/o esophageal stricture S/P balloon dilation Food impaction   Endo/Other  negative endocrine ROS  Renal/GU Renal disease  negative genitourinary   Musculoskeletal  (+) Arthritis , Osteoarthritis,    Abdominal Normal abdominal exam  (+)   Peds  Hematology  (+) Blood dyscrasia, anemia ,   Anesthesia Other Findings   Reproductive/Obstetrics                           Anesthesia Physical  Anesthesia Plan  ASA: 3 and emergent  Anesthesia Plan: General   Post-op Pain Management: Minimal or no pain anticipated   Induction: Intravenous, Rapid sequence and Cricoid pressure planned  PONV Risk Score and Plan: 1 and Ondansetron and Treatment may vary due to age or medical condition  Airway Management Planned: Oral ETT  Additional Equipment: None  Intra-op Plan:   Post-operative Plan: Extubation in OR  Informed Consent: I have reviewed the patients History and Physical, chart, labs and discussed the procedure including the risks, benefits and alternatives for the proposed anesthesia with the patient or authorized representative who has indicated his/her understanding and acceptance.     Dental  advisory given  Plan Discussed with: Anesthesiologist  Anesthesia Plan Comments:       Anesthesia Quick Evaluation

## 2022-04-25 NOTE — ED Triage Notes (Signed)
Pt states she ate ham at about 1100 today and got food stuck in her throat. Pt states her esophagus shrank after radiation and she has this problem sometimes. Pt denies difficulty swallowing, denies difficulty breathing.

## 2022-04-25 NOTE — ED Provider Notes (Signed)
[place] DEPT Provider Note   CSN: [id number] Arrival date & time: 04/25/22  1220     History  Chief Complaint  Patient presents with   Food Bolus    [name] is a 51 y.o. female.  She has a history of lung cancer and underwent radiation in the past.  She has a history of esophageal stricture requiring endoscopies.  She is complaining of foreign body sensation in her esophagus after eating ham this morning around 11 AM.  She was here twice last week for same.  Follows with Dr. Watt Climes from [place].  Complaining of pain in her chest.  The history is provided by the patient.  Swallowed Foreign Body This is a recurrent problem. The current episode started 3 to 5 hours ago. The problem occurs constantly. The problem has not changed since onset.Associated symptoms include chest pain. Pertinent negatives include no abdominal pain, no headaches and no shortness of breath. The symptoms are aggravated by swallowing and drinking. Nothing relieves the symptoms. She has tried water for the symptoms. The treatment provided no relief.      Home Medications Prior to Admission medications   Medication Sig Start Date End Date Taking? Authorizing Provider  albuterol (VENTOLIN HFA) 108 (90 Base) MCG/ACT inhaler Inhale 2 puffs into the lungs every 6 (six) hours as needed for wheezing or shortness of breath. 02/08/22   Mercy Riding, MD  apixaban (ELIQUIS) 5 MG TABS tablet Take 1 tablet (5 mg total) by mouth 2 (two) times daily. Patient not taking: Reported on 04/04/2022 03/18/22   Aline August, MD  diphenhydramine-acetaminophen (TYLENOL PM) 25-500 MG TABS tablet Take 3 tablets by mouth at bedtime as needed (for sleep).    [provider]  folic acid (FOLVITE) 1 MG tablet Take 1 tablet (1 mg total) by mouth daily. Patient not taking: Reported on 03/03/2022 02/09/22   Mercy Riding, MD  Multiple Vitamin (MULTIVITAMIN) LIQD Take 15 mLs by mouth daily. Patient not  taking: Reported on 03/03/2022 02/08/22   Mercy Riding, MD  nicotine (NICODERM CQ - DOSED IN MG/24 HOURS) 21 mg/24hr patch Place 1 patch (21 mg total) onto the skin daily. Patient not taking: Reported on 04/04/2022 03/07/22   Oswald Hillock, MD  ondansetron (ZOFRAN-ODT) 8 MG disintegrating tablet Dissolve 1 tablet (8 mg total) by mouth every 8 (eight) hours as needed for nausea or vomiting. Patient not taking: Reported on 04/04/2022 02/08/22   Mercy Riding, MD  oxyCODONE (OXY IR/ROXICODONE) 5 MG immediate release tablet Take 1 tablet (5 mg total) by mouth every 6 (six) hours as needed for severe pain. Patient not taking: Reported on 04/04/2022 03/07/22   Oswald Hillock, MD  pantoprazole (PROTONIX) 40 MG tablet Take 1 tablet (40 mg total) by mouth 2 (two) times daily. Patient not taking: Reported on 03/13/2022 03/07/22 06/05/22  Oswald Hillock, MD  potassium chloride (KLOR-CON) 20 MEQ packet Dilute the contents of 1 packet in 4 oz of water and drink by mouth 2  times daily for 5 days. 04/18/22 04/23/22  Pattricia Boss, MD  sucralfate (CARAFATE) 1 GM/10ML suspension Take 10 mLs (1 g total) by mouth 4 (four) times daily -  with meals and at bedtime. Patient not taking: Reported on 04/04/2022 03/07/22   Oswald Hillock, MD  tiotropium (SPIRIVA HANDIHALER) 18 MCG inhalation capsule Place 1 capsule (18 mcg total) into inhaler and inhale daily. Patient not taking: Reported on 03/03/2022 02/08/22 05/09/22  Mercy Riding, MD      Allergies    Compazine [prochlorperazine], Naproxen, and Bactrim [sulfamethoxazole-trimethoprim]    Review of Systems   Review of Systems  Constitutional:  Negative for fever.  HENT:  Negative for sore throat.   Eyes:  Negative for visual disturbance.  Respiratory:  Negative for shortness of breath.   Cardiovascular:  Positive for chest pain.  Gastrointestinal:  Positive for vomiting. Negative for abdominal pain.  Genitourinary:  Negative for dysuria.  Musculoskeletal:  Negative for neck pain.   Skin:  Negative for rash.  Neurological:  Negative for headaches.   Physical Exam Updated Vital Signs BP (!) 126/93 (BP Location: Right Arm)   Pulse (!) 109   Temp 98.2 F (36.8 C) (Oral)   Resp 16   LMP 04/24/2018   SpO2 100%  Physical Exam Vitals and nursing note reviewed.  Constitutional:      General: She is not in acute distress.    Appearance: Normal appearance. She is well-developed.  HENT:     Head: Normocephalic and atraumatic.  Eyes:     Conjunctiva/sclera: Conjunctivae normal.  Cardiovascular:     Rate and Rhythm: Normal rate and regular rhythm.     Heart sounds: No murmur heard. Pulmonary:     Effort: Pulmonary effort is normal. No respiratory distress.     Breath sounds: Normal breath sounds.  Abdominal:     Palpations: Abdomen is soft.     Tenderness: There is no abdominal tenderness. There is no guarding or rebound.  Musculoskeletal:     Cervical back: Neck supple.     Right lower leg: No edema.     Left lower leg: No edema.  Skin:    General: Skin is warm and dry.     Capillary Refill: Capillary refill takes less than 2 seconds.  Neurological:     General: No focal deficit present.     Mental Status: She is alert.    ED Results / Procedures / Treatments   Labs (all labs ordered are listed, but only abnormal results are displayed) Labs Reviewed  BASIC METABOLIC PANEL  CBC WITH DIFFERENTIAL/PLATELET    EKG None  Radiology DG Chest [facility] 1 View  Result Date: 04/25/2022 CLINICAL DATA:  Chest pain. EXAM: PORTABLE CHEST 1 VIEW COMPARISON:  Apr 18, 2022. FINDINGS: The heart size and mediastinal contours are within normal limits. Hyperinflation of the lungs is noted. Both lungs are clear. The visualized skeletal structures are unremarkable. IMPRESSION: No active disease.  Hyperinflation of the lungs is noted. Electronically Signed   By: Marijo Conception M.D.   On: 04/25/2022 14:43    Procedures Procedures    Medications Ordered in ED Medications   sodium chloride 0.9 % bolus 1,000 mL (has no administration in time range)  fentaNYL (SUBLIMAZE) injection 50 mcg (has no administration in time range)  glucagon (human recombinant) (GLUCAGEN) injection 2 mg (has no administration in time range)    ED Course/ Medical Decision Making/ A&P Clinical Course as of 04/25/22 1743  Thu Apr 25, 2022  1422 Discussed with Dr. Fabio Pierce GI.  He will be by to see the patient for possible endoscopy. [MB]  4128 Chest x-ray does not show any acute infiltrates.  Awaiting radiology reading. [MB]  1442 Patient being taken up to endoscopy for evaluation.  Likely will be discharged from recovery room.  I did let the nurse taking the patient him know that her labs had not resulted. [MB]  Clinical Course User Index [MB] Hayden Rasmussen, MD                           Medical Decision Making Amount and/or Complexity of Data Reviewed Labs: ordered. Radiology: ordered.  Risk Prescription drug management.  This patient complains of esophageal impaction; this involves an extensive number of treatment Options and is a complaint that carries with it a high risk of complications and morbidity. The differential includes food impaction, esophagitis, stricture  I ordered imaging studies which included chest x-ray and I independently    visualized and interpreted imaging which showed no acute findings Additional history obtained from patient's companion Previous records obtained and reviewed in epic including recent ED visit ED visits for similar presentation I consulted gastroenterology Dr. Alessandra Bevels and discussed lab and imaging findings and discussed disposition.  Cardiac monitoring reviewed, normal sinus rhythm Social determinants considered, ongoing tobacco use Critical Interventions: None  After the interventions stated above, I reevaluated the patient and found patient still to be symptomatic Admission and further testing considered, she has  been taken up to the endoscopy suite discussed with GI regarding endoscopy.  Labs and medications ordered, not administered in the ED.          Final Clinical Impression(s) / ED Diagnoses Final diagnoses:  Esophageal foreign body, initial encounter    Rx / DC Orders ED Discharge Orders     None         Hayden Rasmussen, MD 04/25/22 1746

## 2022-04-25 NOTE — Transfer of Care (Signed)
Immediate Anesthesia Transfer of Care Note  Patient: VICKEY BOAK  Procedure(s) Performed: ESOPHAGOGASTRODUODENOSCOPY (EGD) WITH PROPOFOL  Patient Location: PACU  Anesthesia Type:General  Level of Consciousness: awake, alert  and oriented  Airway & Oxygen Therapy: Patient Spontanous Breathing and Patient connected to face mask oxygen  Post-op Assessment: Report given to RN, Post -op Vital signs reviewed and stable and Patient moving all extremities X 4  Post vital signs: Reviewed and stable  Last Vitals:  Vitals Value Taken Time  BP 129/107   Temp    Pulse 122 04/25/22 1531  Resp 27 04/25/22 1531  SpO2 99 % 04/25/22 1531  Vitals shown include unvalidated device data.  Last Pain:  Vitals:   04/25/22 1445  TempSrc: Temporal  PainSc: 9          Complications: No notable events documented.

## 2022-04-25 NOTE — Op Note (Signed)
[facility] Patient Name: [name] Procedure Date: 04/25/2022 MRN: [id number] Attending MD: Otis Brace , MD Date of Birth: 1971-11-20 CSN: [id number] Age: 51 Admit Type: Outpatient Procedure:                Upper GI endoscopy Indications:              Foreign body in the esophagus Providers:                Otis Brace, MD, Jaci Carrel, RN, [facility] Technician, Technician Referring MD:              Medicines:                Sedation Administered by an Anesthesia Professional Complications:            No immediate complications. Estimated Blood Loss:     Estimated blood loss was minimal. Procedure:                Pre-Anesthesia Assessment:                           - Prior to the procedure, a History and Physical                            was performed, and patient medications and                            allergies were reviewed. The patient's tolerance of                            previous anesthesia was also reviewed. The risks                            and benefits of the procedure and the sedation                            options and risks were discussed with the patient.                            All questions were answered, and informed consent                            was obtained. Prior Anticoagulants: The patient has                            taken no previous anticoagulant or antiplatelet                            agents. ASA Grade Assessment: III - A patient with                            severe systemic disease. After reviewing the risks  and benefits, the patient was deemed in                            satisfactory condition to undergo the procedure.                           After obtaining informed consent, the endoscope was                            passed under direct vision. Throughout the                            procedure, the patient's blood pressure, pulse, and                             oxygen saturations were monitored continuously. The                            [id number]-H190 ([id number]) Olympus endoscope was introduced                            through the mouth, and advanced to the second part                            of duodenum. The upper GI endoscopy was                            accomplished without difficulty. The patient                            tolerated the procedure well. Scope In: Scope Out: Findings:      Food was found in the upper third of the esophagus. Removal of food was       accomplished by pushing down food bolus with gentle pressure. Mild       superficial mucosal tear noted at stricture site.      One benign-appearing, intrinsic moderate (circumferential scarring or       stenosis; an endoscope may pass) stenosis was found 24 cm from the       incisors. The stenosis was traversed.      Diffuse mild inflammation characterized by congestion (edema) was found       in the entire examined stomach.      The cardia and gastric fundus were normal on retroflexion.      The duodenal bulb, first portion of the duodenum and second portion of       the duodenum were normal. Impression:               - Food in the upper third of the esophagus. Removal                            was successful.                           - Benign-appearing esophageal stenosis.                           -  Gastritis.                           - Normal duodenal bulb, first portion of the                            duodenum and second portion of the duodenum. Moderate Sedation:      Moderate (conscious) sedation was personally administered by an       anesthesia professional. The following parameters were monitored: oxygen       saturation, heart rate, blood pressure, and response to care. Recommendation:           - Patient has a contact number available for                            emergencies. The signs and symptoms of potential                             delayed complications were discussed with the                            patient. Return to normal activities tomorrow.                            Written discharge instructions were provided to the                            patient.                           - Pureed diet. Avoid Solid food.                           - Continue present medications.                           - Repeat upper endoscopy PRN for retreatment. Procedure Code(s):        --- Professional ---                           [phone], Esophagogastroduodenoscopy, flexible,                            transoral; with removal of foreign body(s) Diagnosis Code(s):        --- Professional ---                           J19.417E, Food in esophagus causing other injury,                            initial encounter                           K22.2, Esophageal obstruction                           K29.70, Gastritis, unspecified, without bleeding  T18.108A, Unspecified foreign body in esophagus                            causing other injury, initial encounter CPT copyright 2019 [employer]. All rights reserved. The codes documented in this report are preliminary and upon coder review may  be revised to meet current compliance requirements. Otis Brace, MD Otis Brace, MD 04/25/2022 3:29:17 PM Number of Addenda: 0

## 2022-04-25 NOTE — Anesthesia Postprocedure Evaluation (Signed)
Anesthesia Post Note  Patient: [name]  Procedure(s) Performed: ESOPHAGOGASTRODUODENOSCOPY (EGD) WITH PROPOFOL     Patient location during evaluation: Endoscopy Anesthesia Type: General Level of consciousness: awake and alert Pain management: pain level controlled Vital Signs Assessment: post-procedure vital signs reviewed and stable Respiratory status: spontaneous breathing, nonlabored ventilation, respiratory function stable and patient connected to nasal cannula oxygen Cardiovascular status: blood pressure returned to baseline and stable Postop Assessment: no apparent nausea or vomiting Anesthetic complications: no   No notable events documented.  Last Vitals:  Vitals:   04/25/22 1540 04/25/22 1552  BP: (!) 139/95 (!) 135/94  Pulse: (!) 105 (!) 105  Resp: (!) 21 19  Temp:    SpO2: 98% 95%    Last Pain:  Vitals:   04/25/22 1552  TempSrc:   PainSc: 0-No pain                 Belenda Cruise P Ryoma Nofziger

## 2022-04-26 ENCOUNTER — Other Ambulatory Visit: Payer: Self-pay

## 2022-04-26 ENCOUNTER — Other Ambulatory Visit (HOSPITAL_COMMUNITY): Payer: Self-pay

## 2022-04-26 DIAGNOSIS — G893 Neoplasm related pain (acute) (chronic): Secondary | ICD-10-CM

## 2022-04-28 ENCOUNTER — Encounter (HOSPITAL_COMMUNITY)
Admission: EM | Disposition: A | Discharge status: Home / Self Care | Payer: Self-pay | Source: Home / Self Care | Attending: Emergency Medicine

## 2022-04-28 ENCOUNTER — Other Ambulatory Visit: Payer: Self-pay

## 2022-04-28 ENCOUNTER — Emergency Department (HOSPITAL_COMMUNITY): Payer: Medicaid Other | Admitting: Certified Registered"

## 2022-04-28 ENCOUNTER — Emergency Department (EMERGENCY_DEPARTMENT_HOSPITAL): Payer: Medicaid Other | Admitting: Certified Registered"

## 2022-04-28 ENCOUNTER — Ambulatory Visit (HOSPITAL_COMMUNITY)
Admission: EM | Admit: 2022-04-28 | Discharge: 2022-04-28 | Disposition: A | Discharge status: Home / Self Care | Payer: Medicaid Other | Attending: Emergency Medicine | Admitting: Emergency Medicine

## 2022-04-28 ENCOUNTER — Encounter (HOSPITAL_COMMUNITY): Payer: Self-pay | Admitting: Certified Registered"

## 2022-04-28 ENCOUNTER — Emergency Department (HOSPITAL_COMMUNITY): Payer: Medicaid Other

## 2022-04-28 DIAGNOSIS — K2980 Duodenitis without bleeding: Secondary | ICD-10-CM | POA: Insufficient documentation

## 2022-04-28 DIAGNOSIS — K222 Esophageal obstruction: Secondary | ICD-10-CM

## 2022-04-28 DIAGNOSIS — J449 Chronic obstructive pulmonary disease, unspecified: Secondary | ICD-10-CM | POA: Insufficient documentation

## 2022-04-28 DIAGNOSIS — F17210 Nicotine dependence, cigarettes, uncomplicated: Secondary | ICD-10-CM | POA: Insufficient documentation

## 2022-04-28 DIAGNOSIS — T18128A Food in esophagus causing other injury, initial encounter: Secondary | ICD-10-CM | POA: Insufficient documentation

## 2022-04-28 DIAGNOSIS — K2210 Ulcer of esophagus without bleeding: Secondary | ICD-10-CM

## 2022-04-28 DIAGNOSIS — K2990 Gastroduodenitis, unspecified, without bleeding: Secondary | ICD-10-CM

## 2022-04-28 DIAGNOSIS — Z85118 Personal history of other malignant neoplasm of bronchus and lung: Secondary | ICD-10-CM | POA: Insufficient documentation

## 2022-04-28 DIAGNOSIS — X58XXXA Exposure to other specified factors, initial encounter: Secondary | ICD-10-CM | POA: Insufficient documentation

## 2022-04-28 DIAGNOSIS — Y842 Radiological procedure and radiotherapy as the cause of abnormal reaction of the patient, or of later complication, without mention of misadventure at the time of the procedure: Secondary | ICD-10-CM | POA: Diagnosis not present

## 2022-04-28 HISTORY — PX: ESOPHAGOGASTRODUODENOSCOPY (EGD) WITH PROPOFOL: SHX5813

## 2022-04-28 HISTORY — PX: FOREIGN BODY REMOVAL: SHX962

## 2022-04-28 SURGERY — ESOPHAGOGASTRODUODENOSCOPY (EGD) WITH PROPOFOL
Anesthesia: General

## 2022-04-28 MED ORDER — ONDANSETRON HCL 4 MG/2ML IJ SOLN
INTRAMUSCULAR | Status: AC
  Filled 2022-04-28: qty 2

## 2022-04-28 MED ORDER — FENTANYL CITRATE (PF) 100 MCG/2ML IJ SOLN
INTRAMUSCULAR | Status: DC | PRN
  Administered 2022-04-28 (×2): 50 ug via INTRAVENOUS

## 2022-04-28 MED ORDER — LACTATED RINGERS IV SOLN
INTRAVENOUS | Status: AC | PRN
  Administered 2022-04-28: 20 mL/h via INTRAVENOUS

## 2022-04-28 MED ORDER — LIDOCAINE 2% (20 MG/ML) 5 ML SYRINGE
INTRAMUSCULAR | Status: DC | PRN
  Administered 2022-04-28: 60 mg via INTRAVENOUS

## 2022-04-28 MED ORDER — ONDANSETRON HCL 4 MG/2ML IJ SOLN
4.0000 mg | Freq: Once | INTRAMUSCULAR | Status: AC
  Administered 2022-04-28: 4 mg via INTRAVENOUS
  Filled 2022-04-28: qty 2

## 2022-04-28 MED ORDER — FENTANYL CITRATE (PF) 100 MCG/2ML IJ SOLN
INTRAMUSCULAR | Status: AC
  Filled 2022-04-28: qty 2

## 2022-04-28 MED ORDER — FENTANYL CITRATE PF 50 MCG/ML IJ SOSY
50.0000 ug | PREFILLED_SYRINGE | Freq: Once | INTRAMUSCULAR | Status: AC
  Administered 2022-04-28: 50 ug via INTRAVENOUS
  Filled 2022-04-28: qty 1

## 2022-04-28 MED ORDER — GLUCAGON HCL RDNA (DIAGNOSTIC) 1 MG IJ SOLR
1.0000 mg | Freq: Once | INTRAMUSCULAR | Status: AC
  Administered 2022-04-28: 1 mg via INTRAVENOUS
  Filled 2022-04-28: qty 1

## 2022-04-28 MED ORDER — PROPOFOL 10 MG/ML IV BOLUS
INTRAVENOUS | Status: AC
  Filled 2022-04-28: qty 20

## 2022-04-28 MED ORDER — DEXAMETHASONE SODIUM PHOSPHATE 10 MG/ML IJ SOLN
INTRAMUSCULAR | Status: DC | PRN
  Administered 2022-04-28: 10 mg via INTRAVENOUS

## 2022-04-28 MED ORDER — SUGAMMADEX SODIUM 500 MG/5ML IV SOLN
INTRAVENOUS | Status: AC
  Filled 2022-04-28: qty 5

## 2022-04-28 MED ORDER — PROPOFOL 10 MG/ML IV BOLUS
INTRAVENOUS | Status: DC | PRN
  Administered 2022-04-28: 140 mg via INTRAVENOUS

## 2022-04-28 MED ORDER — SUCCINYLCHOLINE CHLORIDE 200 MG/10ML IV SOSY
PREFILLED_SYRINGE | INTRAVENOUS | Status: DC | PRN
  Administered 2022-04-28: 100 mg via INTRAVENOUS

## 2022-04-28 MED ORDER — LIDOCAINE HCL (PF) 2 % IJ SOLN
INTRAMUSCULAR | Status: AC
  Filled 2022-04-28: qty 5

## 2022-04-28 MED ORDER — SODIUM CHLORIDE 0.9 % IV SOLN: INTRAVENOUS | Status: DC

## 2022-04-28 MED ORDER — SUCCINYLCHOLINE CHLORIDE 200 MG/10ML IV SOSY
PREFILLED_SYRINGE | INTRAVENOUS | Status: AC
  Filled 2022-04-28: qty 10

## 2022-04-28 MED ORDER — FENTANYL CITRATE PF 50 MCG/ML IJ SOSY
25.0000 ug | PREFILLED_SYRINGE | Freq: Once | INTRAMUSCULAR | Status: AC
  Administered 2022-04-28: 25 ug via INTRAVENOUS
  Filled 2022-04-28: qty 1

## 2022-04-28 MED ORDER — SODIUM CHLORIDE 0.9 % IV BOLUS
1000.0000 mL | Freq: Once | INTRAVENOUS | Status: AC
  Administered 2022-04-28: 1000 mL via INTRAVENOUS

## 2022-04-28 SURGICAL SUPPLY — 15 items

## 2022-04-28 NOTE — Transfer of Care (Signed)
Immediate Anesthesia Transfer of Care Note  Patient: [name]  Procedure(s) Performed: ESOPHAGOGASTRODUODENOSCOPY (EGD) WITH PROPOFOL FOREIGN BODY REMOVAL  Patient Location: Endoscopy Unit  Anesthesia Type:General  Level of Consciousness: awake, alert , oriented and patient cooperative  Airway & Oxygen Therapy: Patient Spontanous Breathing and Patient connected to face mask oxygen  Post-op Assessment: Report given to RN, Post -op Vital signs reviewed and stable and Patient moving all extremities  Post vital signs: Reviewed and stable  Last Vitals:  Vitals Value Taken Time  BP 131/82 04/28/22 1551  Temp    Pulse 111 04/28/22 1552  Resp 23 04/28/22 1552  SpO2 95 % 04/28/22 1552  Vitals shown include unvalidated device data.  Last Pain:  Vitals:   04/28/22 1516  TempSrc: Temporal  PainSc:          Complications: No notable events documented.

## 2022-04-28 NOTE — H&P (Signed)
[name] is an 51 y.o. female.   Chief Complaint: Esophageal food impaction HPI: 51 year old female with history of lung cancer, underwent radiation, presents with esophageal food bolus impaction. Patient had an EGD 3 days ago for food bolus impaction and was advised to stay away from meat. It seems that patient had eggs and sausage, last meal today morning at 9 AM, and complains of food not going down, unable to tolerate liquids, not associated with respiratory compromise.  Past Medical History:  Diagnosis Date   Ankle sprain    left   Anxiety    treated for panic attacks in the past.   Asthma    COPD (chronic obstructive pulmonary disease) ([place])    Depression    History of radiation therapy    Right lung- 09/26/21-11/13/21- Dr. Gery Pray   Irregular heart rate    Lung cancer [facility])    Renal disorder    kidney infections    Past Surgical History:  Procedure Laterality Date   APPENDECTOMY     BALLOON DILATION N/A 03/15/2022   Procedure: BALLOON DILATION;  Surgeon: Ronnette Juniper, MD;  Location: [facility] ENDOSCOPY;  Service: Gastroenterology;  Laterality: N/A;   BRONCHIAL BIOPSY  09/21/2021   Procedure: BRONCHIAL BIOPSIES;  Surgeon: Collene Gobble, MD;  Location: [facility] ENDOSCOPY;  Service: Pulmonary;;   BRONCHIAL BRUSHINGS  09/21/2021   Procedure: BRONCHIAL BRUSHINGS;  Surgeon: Collene Gobble, MD;  Location: [facility] ENDOSCOPY;  Service: Pulmonary;;   BRONCHIAL NEEDLE ASPIRATION BIOPSY  09/21/2021   Procedure: BRONCHIAL NEEDLE ASPIRATION BIOPSIES;  Surgeon: Collene Gobble, MD;  Location: [place] ENDOSCOPY;  Service: Pulmonary;;   ESOPHAGEAL STENT PLACEMENT N/A 03/06/2022   Procedure: ESOPHAGEAL STENT PLACEMENT;  Surgeon: Clarene Essex, MD;  Location: [facility] ENDOSCOPY;  Service: Gastroenterology;  Laterality: N/A;   ESOPHAGOGASTRODUODENOSCOPY N/A 12/26/2021   Procedure: ESOPHAGOGASTRODUODENOSCOPY (EGD);  Surgeon: Clarene Essex, MD;  Location: [facility];  Service: Endoscopy;  Laterality: N/A;    ESOPHAGOGASTRODUODENOSCOPY N/A 02/11/2022   Procedure: ESOPHAGOGASTRODUODENOSCOPY (EGD);  Surgeon: Arta Silence, MD;  Location: [facility] ENDOSCOPY;  Service: Gastroenterology;  Laterality: N/A;   ESOPHAGOGASTRODUODENOSCOPY N/A 03/03/2022   Procedure: ESOPHAGOGASTRODUODENOSCOPY (EGD);  Surgeon: Wilford Corner, MD;  Location: [facility]OS[facility];  Service: Gastroenterology;  Laterality: N/A;   ESOPHAGOGASTRODUODENOSCOPY N/A 03/06/2022   Procedure: ESOPHAGOGASTRODUODENOSCOPY (EGD);  Surgeon: Clarene Essex, MD;  Location: [facility] ENDOSCOPY;  Service: Gastroenterology;  Laterality: N/A;  With esophageal stent placement   ESOPHAGOGASTRODUODENOSCOPY N/A 03/15/2022   Procedure: ESOPHAGOGASTRODUODENOSCOPY (EGD);  Surgeon: Ronnette Juniper, MD;  Location: [facility];  Service: Gastroenterology;  Laterality: N/A;  with dilation   ESOPHAGOGASTRODUODENOSCOPY N/A 04/18/2022   Procedure: ESOPHAGOGASTRODUODENOSCOPY (EGD);  Surgeon: Clarene Essex, MD;  Location: [facility]OS[facility];  Service: Gastroenterology;  Laterality: N/A;   ESOPHAGOGASTRODUODENOSCOPY (EGD) WITH PROPOFOL N/A 01/07/2022   Procedure: ESOPHAGOGASTRODUODENOSCOPY (EGD) WITH PROPOFOL;  Surgeon: Otis Brace, MD;  Location: [facility]OS[facility];  Service: Gastroenterology;  Laterality: N/A;   ESOPHAGOGASTRODUODENOSCOPY (EGD) WITH PROPOFOL N/A 02/02/2022   Procedure: ESOPHAGOGASTRODUODENOSCOPY (EGD) WITH PROPOFOL;  Surgeon: Ronnette Juniper, MD;  Location: [facility];  Service: Gastroenterology;  Laterality: N/A;   ESOPHAGOGASTRODUODENOSCOPY (EGD) WITH PROPOFOL N/A 02/04/2022   Procedure: ESOPHAGOGASTRODUODENOSCOPY (EGD) WITH PROPOFOL;  Surgeon: Otis Brace, MD;  Location: [facility]OS[facility];  Service: Gastroenterology;  Laterality: N/A;   ESOPHAGOGASTRODUODENOSCOPY (EGD) WITH PROPOFOL N/A 03/13/2022   Procedure: ESOPHAGOGASTRODUODENOSCOPY (EGD) WITH PROPOFOL;  Surgeon: Clarene Essex, MD;  Location: [facility];  Service: Gastroenterology;  Laterality: N/A;   ESOPHAGOGASTRODUODENOSCOPY (EGD)  WITH PROPOFOL N/A 04/25/2022   Procedure: ESOPHAGOGASTRODUODENOSCOPY (EGD)  WITH PROPOFOL;  Surgeon: Otis Brace, MD;  Location: [facility];  Service: Gastroenterology;  Laterality: N/A;   ESOPHAGOSCOPY N/A 04/20/2022   Procedure: ESOPHAGOSCOPY;  Surgeon: Clarene Essex, MD;  Location: [facility];  Service: Gastroenterology;  Laterality: N/A;   FOREIGN BODY REMOVAL  12/26/2021   Procedure: FOREIGN BODY REMOVAL;  Surgeon: Clarene Essex, MD;  Location: [facility]OS[facility];  Service: Endoscopy;;   FOREIGN BODY REMOVAL  02/02/2022   Procedure: FOREIGN BODY REMOVAL;  Surgeon: Ronnette Juniper, MD;  Location: [facility];  Service: Gastroenterology;;   FOREIGN BODY REMOVAL  02/11/2022   Procedure: FOREIGN BODY REMOVAL;  Surgeon: Arta Silence, MD;  Location: [facility];  Service: Gastroenterology;;   FOREIGN BODY REMOVAL N/A 03/03/2022   Procedure: FOREIGN BODY REMOVAL;  Surgeon: Wilford Corner, MD;  Location: [facility];  Service: Gastroenterology;  Laterality: N/A;   FOREIGN BODY REMOVAL  04/18/2022   Procedure: FOREIGN BODY REMOVAL;  Surgeon: Clarene Essex, MD;  Location: [facility]OSCOPY;  Service: Gastroenterology;;   FOREIGN BODY REMOVAL  04/20/2022   Procedure: FOREIGN BODY REMOVAL;  Surgeon: Clarene Essex, MD;  Location: [facility] ENDOSCOPY;  Service: Gastroenterology;;  Food Impaction   FOREIGN BODY REMOVAL  04/25/2022   Procedure: FOREIGN BODY REMOVAL;  Surgeon: Otis Brace, MD;  Location: [facility];  Service: Gastroenterology;;  Food Impaction   IR CHEST FLUORO  12/27/2021   LUMBAR LAMINECTOMY N/A 05/13/2018   Procedure: LEFT LUMBAR FOUR-FIVE MICRODISCECTOMY;  Surgeon: Marybelle Killings, MD;  Location: [facility];  Service: Orthopedics;  Laterality: N/A;   STENT REMOVAL  03/13/2022   Procedure: STENT REMOVAL;  Surgeon: Clarene Essex, MD;  Location: [facility];  Service: Gastroenterology;;   TUBAL LIGATION     TUBAL LIGATION  1998   VIDEO BRONCHOSCOPY WITH ENDOBRONCHIAL ULTRASOUND N/A 09/21/2021   Procedure: VIDEO  BRONCHOSCOPY WITH ENDOBRONCHIAL ULTRASOUND;  Surgeon: Collene Gobble, MD;  Location: [place] ENDOSCOPY;  Service: Pulmonary;  Laterality: N/A;    Family History  Problem Relation Age of Onset   Hypertension Mother    Hypertension Father    Heart disease Father    Social History:  reports that she has been smoking cigarettes. She has a 15.00 pack-year smoking history. She has never used smokeless tobacco. She reports that she does not currently use alcohol. She reports that she does not currently use drugs after having used the following drugs: Marijuana.  Allergies:  Allergies  Allergen Reactions   Compazine [Prochlorperazine] Other (See Comments)    Hypotension and lethargy   Naproxen Nausea Only   Bactrim [Sulfamethoxazole-Trimethoprim] Nausea Only    Medications Prior to Admission  Medication Sig Dispense Refill   albuterol (VENTOLIN HFA) 108 (90 Base) MCG/ACT inhaler Inhale 2 puffs into the lungs every 6 (six) hours as needed for wheezing or shortness of breath. 18 g 2   diphenhydramine-acetaminophen (TYLENOL PM) 25-500 MG TABS tablet Take 2 tablets by mouth at bedtime as needed (for sleep).     fluticasone (FLONASE) 50 MCG/ACT nasal spray Place 1 spray into both nostrils 2 (two) times daily as needed for allergies or rhinitis.     sucralfate (CARAFATE) 1 GM/10ML suspension Take 10 mLs (1 g total) by mouth 4 (four) times daily -  with meals and at bedtime. (Patient taking differently: Take 1 g by mouth 3 (three) times daily as needed (esophageal irritation).) 420 mL 0   apixaban (ELIQUIS) 5 MG TABS tablet Take 1 tablet (5 mg total) by mouth 2 (two) times daily. (Patient not taking: Reported on 04/04/2022)  folic acid (FOLVITE) 1 MG tablet Take 1 tablet (1 mg total) by mouth daily. (Patient not taking: Reported on 03/03/2022) 90 tablet 1   Multiple Vitamin (MULTIVITAMIN) LIQD Take 15 mLs by mouth daily. (Patient not taking: Reported on 03/03/2022) 178 mL 1   nicotine (NICODERM CQ - DOSED IN  MG/24 HOURS) 21 mg/24hr patch Place 1 patch (21 mg total) onto the skin daily. (Patient not taking: Reported on 04/04/2022) 28 patch 0   ondansetron (ZOFRAN-ODT) 8 MG disintegrating tablet Dissolve 1 tablet (8 mg total) by mouth every 8 (eight) hours as needed for nausea or vomiting. (Patient not taking: Reported on 04/04/2022) 30 tablet 0   oxyCODONE (OXY IR/ROXICODONE) 5 MG immediate release tablet Take 1 tablet (5 mg total) by mouth every 6 (six) hours as needed for severe pain. (Patient not taking: Reported on 04/04/2022) 20 tablet 0   pantoprazole (PROTONIX) 40 MG tablet Take 1 tablet by mouth daily. (Patient not taking: Reported on 04/28/2022) 30 tablet 2   potassium chloride (KLOR-CON) 20 MEQ packet Dilute the contents of 1 packet in 4 oz of water and drink by mouth 2  times daily for 5 days. (Patient not taking: Reported on 04/28/2022) 10 packet 0   tiotropium (SPIRIVA HANDIHALER) 18 MCG inhalation capsule Place 1 capsule (18 mcg total) into inhaler and inhale daily. (Patient not taking: Reported on 03/03/2022) 30 capsule 2    No results found for this or any previous visit (from the past 48 hour(s)). DG Neck Soft Tissue  Result Date: 04/28/2022 CLINICAL DATA:  Sensation of foreign body EXAM: NECK SOFT TISSUES - 1+ VIEW COMPARISON:  February 11, 2022 FINDINGS: There is no evidence of retropharyngeal soft tissue swelling or epiglottic enlargement. The cervical airway is unremarkable and no radio-opaque foreign body identified. Straightening of cervical lordosis. IMPRESSION: Negative. Electronically Signed   By: Valentino Saxon M.D.   On: 04/28/2022 13:11   DG Chest Portable 1 View  Result Date: 04/28/2022 CLINICAL DATA:  foreign body sensation in throat EXAM: PORTABLE CHEST 1 VIEW COMPARISON:  April 25, 2022 FINDINGS: The cardiomediastinal silhouette is unchanged in contour. No pleural effusion. No pneumothorax. No acute pleuroparenchymal abnormality. Visualized abdomen is unremarkable. Remote RIGHT-sided  rib fracture. IMPRESSION: No acute cardiopulmonary abnormality. Electronically Signed   By: Valentino Saxon M.D.   On: 04/28/2022 13:14    Review of Systems  Constitutional:  Positive for appetite change and unexpected weight change.  Respiratory:  Positive for chest tightness. Negative for shortness of breath.   Cardiovascular:  Positive for chest pain.  Gastrointestinal:  Positive for nausea and vomiting.   Blood pressure 109/84, pulse 94, temperature 97.6 F (36.4 C), temperature source Temporal, resp. rate 13, last menstrual period 04/24/2018, SpO2 96 %. Physical Exam Constitutional:      Appearance: Normal appearance.  HENT:     Head: Normocephalic and atraumatic.  Eyes:     Conjunctiva/sclera: Conjunctivae normal.  Cardiovascular:     Rate and Rhythm: Tachycardia present.  Pulmonary:     Effort: Pulmonary effort is normal.  Abdominal:     General: There is no distension.  Neurological:     General: No focal deficit present.     Mental Status: She is alert and oriented to person, place, and time.  Psychiatric:        Mood and Affect: Mood normal.        Behavior: Behavior normal.     Assessment/Plan Esophageal food bolus impaction History of radiation-induced esophageal stricture  Plan EGD now. Patient is aware of the risks(bleeding, perforation, anesthesia complications) and benefits of the procedure and consents for an EGD.  Ronnette Juniper, MD 04/28/2022, 3:26 PM

## 2022-04-28 NOTE — Anesthesia Procedure Notes (Signed)
Procedure Name: Intubation Date/Time: 04/28/2022 3:37 PM Performed by: Myna Bright, CRNA Pre-anesthesia Checklist: Patient identified, Suction available, Emergency Drugs available and Patient being monitored Patient Re-evaluated:Patient Re-evaluated prior to induction Oxygen Delivery Method: Circle system utilized Preoxygenation: Pre-oxygenation with 100% oxygen Induction Type: IV induction and Rapid sequence Laryngoscope Size: Mac and 4 Grade View: Grade I Tube type: Oral Tube size: 7.0 mm Number of attempts: 1 Airway Equipment and Method: Stylet Placement Confirmation: ETT inserted through vocal cords under direct vision, positive ETCO2 and breath sounds checked- equal and bilateral Secured at: 21 cm Tube secured with: Tape Dental Injury: Teeth and Oropharynx as per pre-operative assessment

## 2022-04-28 NOTE — ED Triage Notes (Signed)
Pt c/o food (sausage and eggs) stuck in throat. This is a recurring problem, d/t radiation. Pt had EDG on the 1st.  Aox4, able to swallow small amounts of own saliva. Pt made multiple attempts to dislodge food bolus with no success.

## 2022-04-28 NOTE — Op Note (Signed)
[facility] Patient Name: [name] Procedure Date: 04/28/2022 MRN: [id number] Attending MD: Ronnette Juniper , MD Date of Birth: 1971/06/06 CSN: [id number] Age: 51 Admit Type: Outpatient Procedure:                Upper GI endoscopy Indications:              Foreign body in the esophagus, histroy of radiatoin                            induced esophageal stricture, multiple prior EGDs                            for food bolus removal, last one was 3 days ago Providers:                Ronnette Juniper, MD, Grace Isaac, RN, Benetta Spar,                            Technician Referring MD:             ER Medicines:                Monitored Anesthesia Care Complications:            No immediate complications. Estimated blood loss:                            None. Estimated Blood Loss:     Estimated blood loss: none. Procedure:                Pre-Anesthesia Assessment:                           - Prior to the procedure, a History and Physical                            was performed, and patient medications and                            allergies were reviewed. The patient's tolerance of                            previous anesthesia was also reviewed. The risks                            and benefits of the procedure and the sedation                            options and risks were discussed with the patient.                            All questions were answered, and informed consent                            was obtained. Prior Anticoagulants: The patient has  taken no previous anticoagulant or antiplatelet                            agents. ASA Grade Assessment: III - A patient with                            severe systemic disease. After reviewing the risks                            and benefits, the patient was deemed in                            satisfactory condition to undergo the procedure.                           After obtaining  informed consent, the endoscope was                            passed under direct vision. Throughout the                            procedure, the patient's blood pressure, pulse, and                            oxygen saturations were monitored continuously. The                            [id number]-H190 ([id number]) Olympus endoscope was introduced                            through the mouth, and advanced to the second part                            of duodenum. The upper GI endoscopy was                            accomplished without difficulty. The patient                            tolerated the procedure well. Scope In: Scope Out: Findings:      Food was found in the mid esophagus. Impacted food noted at 25 cm from       insertion, it was gently pushed into the gastric cavity with minimal       resistance and minimally mucosal trauma, there was no bleeding noted.      Few superficial esophageal ulcers with no bleeding and no stigmata of       recent bleeding were found 25 cm from the incisors.      The Z-line was regular and was found 40 cm from the incisors.      Food (residue) was found in the cardia, in the gastric fundus and in the       gastric body.      The cardia and gastric fundus were normal on retroflexion.      The gastric antrum and pylorus were  normal.      Localized mild inflammation characterized by congestion (edema) and       erythema was found in the first portion of the duodenum. Impression:               - Food in the mid esophagus.                           - Esophageal ulcers with no bleeding and no                            stigmata of recent bleeding.                           - Z-line regular, 40 cm from the incisors.                           - Food (residue) in the stomach.                           - Normal antrum and pylorus.                           - Duodenitis.                           - No specimens collected. Moderate Sedation:      Patient did  not receive moderate sedation for this procedure, but       instead received monitored anesthesia care. Recommendation:           - Patient has a contact number available for                            emergencies. The signs and symptoms of potential                            delayed complications were discussed with the                            patient. Return to normal activities tomorrow.                            Written discharge instructions were provided to the                            patient.                           - Mechanical soft diet.                           - Avoid all forms of meat. Procedure Code(s):        --- Professional ---                           [phone], Esophagogastroduodenoscopy, flexible,  transoral; diagnostic, including collection of                            specimen(s) by brushing or washing, when performed                            (separate procedure) Diagnosis Code(s):        --- Professional ---                           L24.401U, Food in esophagus causing other injury,                            initial encounter                           K22.10, Ulcer of esophagus without bleeding                           K29.80, Duodenitis without bleeding                           T18.108A, Unspecified foreign body in esophagus                            causing other injury, initial encounter CPT copyright 2019 [employer]. All rights reserved. The codes documented in this report are preliminary and upon coder review may  be revised to meet current compliance requirements. Ronnette Juniper, MD 04/28/2022 3:50:05 PM This report has been signed electronically. Number of Addenda: 0

## 2022-04-28 NOTE — Discharge Instructions (Signed)

## 2022-04-28 NOTE — ED Provider Notes (Signed)
[facility] DEPT Provider Note   CSN: [id number] Arrival date & time: 04/28/22  1112     History Chief Complaint  Patient presents with   Food bolus in throat    [name] is a 51 y.o. female with history of lung cancer who underwent radiation in the past who presents to the emergency department today with a foreign body sensation in her esophagus after eating sausage and eggs earlier this morning.  Patient has had history of esophageal stricture requiring endoscopic removal in the past.  She last had this done 3 days ago.  Complaining of pain in the lower part of her throat and upper chest.  Patient unable to tolerate anything p.o. as it is regurgitated back up.  HPI     Home Medications Prior to Admission medications   Medication Sig Start Date End Date Taking? Authorizing Provider  albuterol (VENTOLIN HFA) 108 (90 Base) MCG/ACT inhaler Inhale 2 puffs into the lungs every 6 (six) hours as needed for wheezing or shortness of breath. 02/08/22  Yes Mercy Riding, MD  diphenhydramine-acetaminophen (TYLENOL PM) 25-500 MG TABS tablet Take 2 tablets by mouth at bedtime as needed (for sleep).   Yes [provider]  fluticasone (FLONASE) 50 MCG/ACT nasal spray Place 1 spray into both nostrils 2 (two) times daily as needed for allergies or rhinitis.   Yes [provider]  sucralfate (CARAFATE) 1 GM/10ML suspension Take 10 mLs (1 g total) by mouth 4 (four) times daily -  with meals and at bedtime. Patient taking differently: Take 1 g by mouth 3 (three) times daily as needed (esophageal irritation). 03/07/22  Yes Darrick Meigs, Marge Duncans, MD  apixaban (ELIQUIS) 5 MG TABS tablet Take 1 tablet (5 mg total) by mouth 2 (two) times daily. Patient not taking: Reported on 04/04/2022 03/18/22   Aline August, MD  folic acid (FOLVITE) 1 MG tablet Take 1 tablet (1 mg total) by mouth daily. Patient not taking: Reported on 03/03/2022 02/09/22   Mercy Riding, MD  Multiple  Vitamin (MULTIVITAMIN) LIQD Take 15 mLs by mouth daily. Patient not taking: Reported on 03/03/2022 02/08/22   Mercy Riding, MD  nicotine (NICODERM CQ - DOSED IN MG/24 HOURS) 21 mg/24hr patch Place 1 patch (21 mg total) onto the skin daily. Patient not taking: Reported on 04/04/2022 03/07/22   Oswald Hillock, MD  ondansetron (ZOFRAN-ODT) 8 MG disintegrating tablet Dissolve 1 tablet (8 mg total) by mouth every 8 (eight) hours as needed for nausea or vomiting. Patient not taking: Reported on 04/04/2022 02/08/22   Mercy Riding, MD  oxyCODONE (OXY IR/ROXICODONE) 5 MG immediate release tablet Take 1 tablet (5 mg total) by mouth every 6 (six) hours as needed for severe pain. Patient not taking: Reported on 04/04/2022 03/07/22   Oswald Hillock, MD  pantoprazole (PROTONIX) 40 MG tablet Take 1 tablet by mouth daily. Patient not taking: Reported on 04/28/2022 04/25/22 07/24/22  Otis Brace, MD  potassium chloride (KLOR-CON) 20 MEQ packet Dilute the contents of 1 packet in 4 oz of water and drink by mouth 2  times daily for 5 days. Patient not taking: Reported on 04/28/2022 04/18/22 04/23/22  Pattricia Boss, MD  tiotropium (SPIRIVA HANDIHALER) 18 MCG inhalation capsule Place 1 capsule (18 mcg total) into inhaler and inhale daily. Patient not taking: Reported on 03/03/2022 02/08/22 05/09/22  Mercy Riding, MD      Allergies    Compazine [prochlorperazine], Naproxen, and Bactrim [sulfamethoxazole-trimethoprim]  Review of Systems   Review of Systems  All other systems reviewed and are negative.  Physical Exam Updated Vital Signs BP 134/77   Pulse (!) 101   Temp (!) 97.5 F (36.4 C) (Temporal)   Resp 20   LMP 04/24/2018   SpO2 93%  Physical Exam Vitals and nursing note reviewed.  Constitutional:      General: She is not in acute distress.    Appearance: Normal appearance.  HENT:     Head: Normocephalic and atraumatic.  Eyes:     General:        Right eye: No discharge.        Left eye: No discharge.   Cardiovascular:     Comments: Regular rate and rhythm.  S1/S2 are distinct without any evidence of murmur, rubs, or gallops.  Radial pulses are 2+ bilaterally.  Dorsalis pedis pulses are 2+ bilaterally.  No evidence of pedal edema. Pulmonary:     Comments: Clear to auscultation bilaterally.  Normal effort.  No respiratory distress.  No evidence of wheezes, rales, or rhonchi heard throughout. Abdominal:     General: Abdomen is flat. Bowel sounds are normal. There is no distension.     Tenderness: There is no abdominal tenderness. There is no guarding or rebound.  Musculoskeletal:        General: Normal range of motion.     Cervical back: Neck supple.  Skin:    General: Skin is warm and dry.     Findings: No rash.  Neurological:     General: No focal deficit present.     Mental Status: She is alert.  Psychiatric:        Mood and Affect: Mood normal.        Behavior: Behavior normal.    ED Results / Procedures / Treatments   Labs (all labs ordered are listed, but only abnormal results are displayed) Labs Reviewed - No data to display  EKG None  Radiology DG Neck Soft Tissue  Result Date: 04/28/2022 CLINICAL DATA:  Sensation of foreign body EXAM: NECK SOFT TISSUES - 1+ VIEW COMPARISON:  February 11, 2022 FINDINGS: There is no evidence of retropharyngeal soft tissue swelling or epiglottic enlargement. The cervical airway is unremarkable and no radio-opaque foreign body identified. Straightening of cervical lordosis. IMPRESSION: Negative. Electronically Signed   By: Valentino Saxon M.D.   On: 04/28/2022 13:11   DG Chest Portable 1 View  Result Date: 04/28/2022 CLINICAL DATA:  foreign body sensation in throat EXAM: PORTABLE CHEST 1 VIEW COMPARISON:  April 25, 2022 FINDINGS: The cardiomediastinal silhouette is unchanged in contour. No pleural effusion. No pneumothorax. No acute pleuroparenchymal abnormality. Visualized abdomen is unremarkable. Remote RIGHT-sided rib fracture. IMPRESSION: No  acute cardiopulmonary abnormality. Electronically Signed   By: Valentino Saxon M.D.   On: 04/28/2022 13:14    Procedures Procedures    Medications Ordered in ED Medications  0.9 %  sodium chloride infusion (has no administration in time range)  fentaNYL (SUBLIMAZE) injection 50 mcg (50 mcg Intravenous Given 04/28/22 1204)  glucagon (human recombinant) (GLUCAGEN) injection 1 mg (1 mg Intravenous Given 04/28/22 1203)  sodium chloride 0.9 % bolus 1,000 mL (1,000 mLs Intravenous New Bag/Given 04/28/22 1205)  fentaNYL (SUBLIMAZE) injection 25 mcg (25 mcg Intravenous Given 04/28/22 1300)  ondansetron (ZOFRAN) injection 4 mg (4 mg Intravenous Given 04/28/22 1441)  fentaNYL (SUBLIMAZE) injection 50 mcg (50 mcg Intravenous Given 04/28/22 1441)  lactated ringers infusion ( Intravenous Restarted 04/28/22 1548)    ED  Course/ Medical Decision Making/ A&P Clinical Course as of 04/28/22 2015  Nancy Fetter Apr 28, 2022  1421 I spoke with Dr. Therisa Doyne with gastroenterology who will come evaluate the patient at bedside and plan to do an EGD today. [CF]    Clinical Course User Index [CF] Hendricks Limes, PA-C                           Medical Decision Making LORRIANE DEHART is a 51 y.o. female who presents to the emergency department today for further evaluation of foreign body sensation in the throat.  This is likely a food bolus impaction concerned that she just had this same problem 3 days ago.  That resulted in EGD at that time.  We will get imaging for evaluation of foreign body.  We will also try glucagon and give her some fluids.  On reevaluation, patient is still complaining of pain despite getting few rounds of fentanyl.  Also given her some antiemetics.  I spoke with gastroenterology who will plan to come do an EGD today.   Amount and/or Complexity of Data Reviewed External Data Reviewed: notes.    Details: EGD note from 04/25/2022 where they successfully retrieved food bolus. Radiology:  ordered.  Risk Prescription drug management.    Final Clinical Impression(s) / ED Diagnoses Final diagnoses:  Food impaction of esophagus, initial encounter    Rx / DC Orders ED Discharge Orders     None         Hendricks Limes, PA-C 04/28/22 2015    Lennice Sites, DO 04/29/22 [phone]

## 2022-04-28 NOTE — Anesthesia Preprocedure Evaluation (Signed)
Anesthesia Evaluation  Patient identified by MRN, date of birth, ID band Patient awake    Reviewed: Allergy & Precautions, NPO status , Patient's Chart, lab work & pertinent test results  History of Anesthesia Complications Negative for: history of anesthetic complications  Airway Mallampati: II  TM Distance: >3 FB Neck ROM: Full    Dental  (+) Partial Upper, Missing,    Pulmonary asthma , pneumonia, resolved, COPD,  COPD inhaler, Current SmokerPatient did not abstain from smoking.,  Hx/o Lung Ca S/P RT   Pulmonary exam normal        Cardiovascular negative cardio ROS   Rhythm:Regular Rate:Normal     Neuro/Psych PSYCHIATRIC DISORDERS Anxiety Depression  Neuromuscular disease    GI/Hepatic Neg liver ROS, Hx/o esophageal stricture S/P balloon dilation Food impaction   Endo/Other  negative endocrine ROS  Renal/GU Renal disease  negative genitourinary   Musculoskeletal  (+) Arthritis , Osteoarthritis,    Abdominal Normal abdominal exam  (+)   Peds  Hematology  (+) Blood dyscrasia, anemia ,   Anesthesia Other Findings   Reproductive/Obstetrics                             Anesthesia Physical  Anesthesia Plan  ASA: 3 and emergent  Anesthesia Plan: General   Post-op Pain Management: Minimal or no pain anticipated   Induction: Intravenous and Rapid sequence  PONV Risk Score and Plan: 2 and Ondansetron and Treatment may vary due to age or medical condition  Airway Management Planned: Oral ETT  Additional Equipment: None  Intra-op Plan:   Post-operative Plan: Extubation in OR  Informed Consent: I have reviewed the patients History and Physical, chart, labs and discussed the procedure including the risks, benefits and alternatives for the proposed anesthesia with the patient or authorized representative who has indicated his/her understanding and acceptance.     Dental advisory  given  Plan Discussed with: Anesthesiologist  Anesthesia Plan Comments:         Anesthesia Quick Evaluation

## 2022-04-28 NOTE — Anesthesia Postprocedure Evaluation (Signed)
Anesthesia Post Note  Patient: SKYLEY GRANDMAISON  Procedure(s) Performed: ESOPHAGOGASTRODUODENOSCOPY (EGD) WITH PROPOFOL FOREIGN BODY REMOVAL     Patient location during evaluation: PACU Anesthesia Type: General Level of consciousness: awake and alert Pain management: pain level controlled Vital Signs Assessment: post-procedure vital signs reviewed and stable Respiratory status: spontaneous breathing, nonlabored ventilation, respiratory function stable and patient connected to nasal cannula oxygen Cardiovascular status: blood pressure returned to baseline and stable Postop Assessment: no apparent nausea or vomiting Anesthetic complications: no   No notable events documented.  Last Vitals:  Vitals:   04/28/22 1600 04/28/22 1609  BP: 127/79 134/77  Pulse: (!) 111 (!) 101  Resp: 17 20  Temp:    SpO2: 98% 93%    Last Pain:  Vitals:   04/28/22 1609  TempSrc:   PainSc: 7                  Tiajuana Amass

## 2022-04-29 ENCOUNTER — Encounter (HOSPITAL_COMMUNITY): Payer: Self-pay | Admitting: Gastroenterology

## 2022-04-30 ENCOUNTER — Telehealth: Payer: Self-pay | Admitting: Internal Medicine

## 2022-04-30 NOTE — Telephone Encounter (Signed)
.  Called patient to schedule appointment per 6/1 inbasket, left pt message

## 2022-05-11 ENCOUNTER — Emergency Department (HOSPITAL_COMMUNITY): Payer: Medicaid Other

## 2022-05-11 ENCOUNTER — Encounter (HOSPITAL_COMMUNITY): Payer: Self-pay | Admitting: Emergency Medicine

## 2022-05-11 ENCOUNTER — Emergency Department (HOSPITAL_COMMUNITY)
Admission: EM | Admit: 2022-05-11 | Discharge: 2022-05-11 | Disposition: A | Discharge status: Home / Self Care | Payer: Medicaid Other | Attending: Emergency Medicine | Admitting: Emergency Medicine

## 2022-05-11 DIAGNOSIS — W108XXA Fall (on) (from) other stairs and steps, initial encounter: Secondary | ICD-10-CM | POA: Diagnosis not present

## 2022-05-11 DIAGNOSIS — Z7901 Long term (current) use of anticoagulants: Secondary | ICD-10-CM | POA: Insufficient documentation

## 2022-05-11 DIAGNOSIS — S8262XA Displaced fracture of lateral malleolus of left fibula, initial encounter for closed fracture: Secondary | ICD-10-CM | POA: Diagnosis not present

## 2022-05-11 DIAGNOSIS — S92352A Displaced fracture of fifth metatarsal bone, left foot, initial encounter for closed fracture: Secondary | ICD-10-CM | POA: Insufficient documentation

## 2022-05-11 DIAGNOSIS — S99922A Unspecified injury of left foot, initial encounter: Secondary | ICD-10-CM | POA: Diagnosis present

## 2022-05-11 MED ORDER — OXYCODONE-ACETAMINOPHEN 5-325 MG PO TABS
1.0000 | ORAL_TABLET | ORAL | Status: DC | PRN
  Administered 2022-05-11: 1 via ORAL
  Filled 2022-05-11: qty 1

## 2022-05-11 MED ORDER — OXYCODONE-ACETAMINOPHEN 5-325 MG PO TABS
1.0000 | ORAL_TABLET | Freq: Once | ORAL | Status: AC
  Administered 2022-05-11: 1 via ORAL
  Filled 2022-05-11: qty 1

## 2022-05-11 MED ORDER — HYDROCODONE-ACETAMINOPHEN 5-325 MG PO TABS: 1.0000 | ORAL_TABLET | Freq: Four times a day (QID) | ORAL | 0 refills | Status: DC | PRN

## 2022-05-11 NOTE — Progress Notes (Signed)
Orthopedic Tech Progress Note Patient Details:  [name] 02-22-1971 [id number]  Ortho Devices Type of Ortho Device: Crutches, CAM walker Ortho Device/Splint Location: left Ortho Device/Splint Interventions: Application   Post Interventions Patient Tolerated: Well, Ambulated well Instructions Provided: Care of device, Adjustment of device, Poper ambulation with device  [place] Pink 05/11/2022, 4:15 PM

## 2022-05-11 NOTE — ED Triage Notes (Signed)
Patient c/o L ankle pain since fall down approximately six stairs last night. Denies head injury, LOC, neck, and back pain.

## 2022-05-11 NOTE — ED Provider Notes (Addendum)
[facility] DEPT Provider Note   CSN: [id number] Arrival date & time: 05/11/22  1334     History  Chief Complaint  Patient presents with   [name] is a 51 y.o. female.   Fall   Patient is a 51 year old female presented emergency room today with complaints of left ankle pain and left foot pain since she rolled her ankle and slid down 6 stairs last night.  She did not hit her head or lose consciousness she denies any neck or back pain or any other areas of pain.  She states that her left ankle is achy and feels somewhat swollen.       Home Medications Prior to Admission medications   Medication Sig Start Date End Date Taking? Authorizing Provider  HYDROcodone-acetaminophen (NORCO/VICODIN) 5-325 MG tablet Take 1 tablet by mouth every 6 (six) hours as needed for severe pain. 05/11/22  Yes Shareef Eddinger S, PA  albuterol (VENTOLIN HFA) 108 (90 Base) MCG/ACT inhaler Inhale 2 puffs into the lungs every 6 (six) hours as needed for wheezing or shortness of breath. 02/08/22   Mercy Riding, MD  apixaban (ELIQUIS) 5 MG TABS tablet Take 1 tablet (5 mg total) by mouth 2 (two) times daily. Patient not taking: Reported on 04/04/2022 03/18/22   Aline August, MD  diphenhydramine-acetaminophen (TYLENOL PM) 25-500 MG TABS tablet Take 2 tablets by mouth at bedtime as needed (for sleep).    [provider]  fluticasone (FLONASE) 50 MCG/ACT nasal spray Place 1 spray into both nostrils 2 (two) times daily as needed for allergies or rhinitis.    [provider]  folic acid (FOLVITE) 1 MG tablet Take 1 tablet (1 mg total) by mouth daily. Patient not taking: Reported on 03/03/2022 02/09/22   Mercy Riding, MD  Multiple Vitamin (MULTIVITAMIN) LIQD Take 15 mLs by mouth daily. Patient not taking: Reported on 03/03/2022 02/08/22   Mercy Riding, MD  nicotine (NICODERM CQ - DOSED IN MG/24 HOURS) 21 mg/24hr patch Place 1 patch (21 mg total) onto the skin  daily. Patient not taking: Reported on 04/04/2022 03/07/22   Oswald Hillock, MD  ondansetron (ZOFRAN-ODT) 8 MG disintegrating tablet Dissolve 1 tablet (8 mg total) by mouth every 8 (eight) hours as needed for nausea or vomiting. Patient not taking: Reported on 04/04/2022 02/08/22   Mercy Riding, MD  oxyCODONE (OXY IR/ROXICODONE) 5 MG immediate release tablet Take 1 tablet (5 mg total) by mouth every 6 (six) hours as needed for severe pain. Patient not taking: Reported on 04/04/2022 03/07/22   Oswald Hillock, MD  pantoprazole (PROTONIX) 40 MG tablet Take 1 tablet by mouth daily. Patient not taking: Reported on 04/28/2022 04/25/22 07/24/22  Otis Brace, MD  potassium chloride (KLOR-CON) 20 MEQ packet Dilute the contents of 1 packet in 4 oz of water and drink by mouth 2  times daily for 5 days. Patient not taking: Reported on 04/28/2022 04/18/22 04/23/22  Pattricia Boss, MD  sucralfate (CARAFATE) 1 GM/10ML suspension Take 10 mLs (1 g total) by mouth 4 (four) times daily -  with meals and at bedtime. Patient taking differently: Take 1 g by mouth 3 (three) times daily as needed (esophageal irritation). 03/07/22   Oswald Hillock, MD  tiotropium (SPIRIVA HANDIHALER) 18 MCG inhalation capsule Place 1 capsule (18 mcg total) into inhaler and inhale daily. Patient not taking: Reported on 03/03/2022 02/08/22 05/09/22  Mercy Riding, MD  Allergies    Compazine [prochlorperazine], Naproxen, and Bactrim [sulfamethoxazole-trimethoprim]    Review of Systems   Review of Systems  Physical Exam Updated Vital Signs BP (!) 120/94   Pulse (!) 122   Temp 98 F (36.7 C) (Oral)   Resp 16   LMP 04/24/2018   SpO2 95%  Physical Exam Vitals and nursing note reviewed.  Constitutional:      General: She is not in acute distress.    Appearance: Normal appearance. She is not ill-appearing.  HENT:     Head: Normocephalic and atraumatic.  Eyes:     General: No scleral icterus.       Right eye: No discharge.        Left eye:  No discharge.     Conjunctiva/sclera: Conjunctivae normal.  Cardiovascular:     Rate and Rhythm: Tachycardia present.     Comments: Heart rate between 100 and 105 Pulmonary:     Effort: Pulmonary effort is normal.     Breath sounds: No stridor.  Musculoskeletal:     Comments: Palpation of left lateral malleolus and proximal dorsal foot fourth/fifth metatarsal base  Neurological:     Mental Status: She is alert and oriented to person, place, and time. Mental status is at baseline.     ED Results / Procedures / Treatments   Labs (all labs ordered are listed, but only abnormal results are displayed) Labs Reviewed - No data to display  EKG None  Radiology DG Ankle Complete Left  Result Date: 05/11/2022 CLINICAL DATA:  Lateral left ankle pain, swelling and bruising following a fall down stairs this morning. EXAM: LEFT ANKLE COMPLETE - 3+ VIEW COMPARISON:  Left foot radiographs obtained today. FINDINGS: Previously described fracture of the base of the 5th metatarsal. This is mildly comminuted with intra-articular extension and distraction of the fragments. Is also a fracture of the lateral malleolus with minimal lateral displacement of the distal fragment. Associated soft tissue swelling. No effusion seen. Moderate sized posterior calcaneal enthesophyte. IMPRESSION: Fractures of the lateral malleolus and base of the 5th metatarsal, as described above. Electronically Signed   By: Claudie Revering M.D.   On: 05/11/2022 15:56   DG Foot Complete Left  Result Date: 05/11/2022 CLINICAL DATA:  Fall EXAM: LEFT FOOT - COMPLETE 3 VIEW COMPARISON:  None Available. FINDINGS: Mildly displaced avulsion fracture of the base of the fifth metatarsal. No evidence of dislocation. Mild degenerative changes of the midfoot. Tissue swelling of the ankle and hindfoot. IMPRESSION: Mildly displaced avulsion fracture of the base of the fifth metatarsal. Electronically Signed   By: Yetta Glassman M.D.   On: 05/11/2022  14:11    Procedures Procedures    Medications Ordered in ED Medications  oxyCODONE-acetaminophen (PERCOCET/ROXICET) 5-325 MG per tablet 1 tablet (1 tablet Oral Given 05/11/22 1447)  oxyCODONE-acetaminophen (PERCOCET/ROXICET) 5-325 MG per tablet 1 tablet (1 tablet Oral Given 05/11/22 1552)    ED Course/ Medical Decision Making/ A&P Clinical Course as of 05/11/22 1950  Sat May 11, 2022  1530 Pulse Rate(!): 122 HR is now 110 [WF]    Clinical Course User Index [WF] Tedd Sias, [place]                           Medical Decision Making Amount and/or Complexity of Data Reviewed Radiology: ordered.  Risk Prescription drug management.   Patient is a 51 year old female presented emergency room today with complaints of left ankle pain and left  foot pain since she rolled her ankle and slid down 6 stairs last night.  She did not hit her head or lose consciousness she denies any neck or back pain or any other areas of pain.  She states that her left ankle is achy and feels somewhat swollen.  Physical exam notable for tenderness of the left lateral malleolus and of the foot.  I personally reviewed x-rays and agree with radiology read she has a fracture of the base of the fifth metatarsal bone and of the lateral malleolus.  Will place in cam walker provide splints and orthopedic follow-up.  Return precautions provided.  A few tablets of Norco for breakthrough pain  Return precautions discussed.  Final Clinical Impression(s) / ED Diagnoses Final diagnoses:  Closed displaced fracture of lateral malleolus of left fibula, initial encounter  Closed fracture of base of fifth metatarsal bone, left, initial encounter    Rx / DC Orders ED Discharge Orders          Ordered    HYDROcodone-acetaminophen (NORCO/VICODIN) 5-325 MG tablet  Every 6 hours PRN        05/11/22 1631              Tedd Sias, PA 05/11/22 1950    Tedd Sias, [place] 05/11/22 1951    Drenda Freeze, MD 05/11/22 2231

## 2022-05-11 NOTE — Discharge Instructions (Addendum)
You have a fracture of the bone that makes up the outside of your ankle this is where you are very tender.  You also have a fracture in your foot at the base of the bone that extends all the way out to your pinky toe.  Please follow-up with an orthopedist or podiatrist.  I given you the information for [facility] which is an office here in [place].  Initial treatment will focus primarily on ice elevation Tylenol ibuprofen  History of drinking plenty of water wear the walking boot this will help with discomfort and use the crutches.  Please use Tylenol or ibuprofen for pain.  You may use 600 mg ibuprofen every 6 hours or 1000 mg of Tylenol every 6 hours.  You may choose to alternate between the 2.  This would be most effective.  Not to exceed 4 g of Tylenol within 24 hours.  Not to exceed 3200 mg ibuprofen 24 hours.

## 2022-05-14 ENCOUNTER — Encounter (HOSPITAL_COMMUNITY): Payer: Self-pay

## 2022-05-14 ENCOUNTER — Ambulatory Visit (HOSPITAL_COMMUNITY): Admit: 2022-05-14 | Payer: Medicaid Other | Admitting: Gastroenterology

## 2022-05-14 SURGERY — ESOPHAGOGASTRODUODENOSCOPY (EGD) WITH PROPOFOL
Anesthesia: Monitor Anesthesia Care

## 2022-05-22 ENCOUNTER — Emergency Department (HOSPITAL_COMMUNITY): Payer: Medicaid Other | Admitting: Anesthesiology

## 2022-05-22 ENCOUNTER — Ambulatory Visit (HOSPITAL_COMMUNITY)
Admission: EM | Admit: 2022-05-22 | Discharge: 2022-05-22 | Disposition: A | Discharge status: Home / Self Care | Payer: Medicaid Other | Attending: Emergency Medicine | Admitting: Emergency Medicine

## 2022-05-22 ENCOUNTER — Emergency Department (EMERGENCY_DEPARTMENT_HOSPITAL): Payer: Medicaid Other | Admitting: Anesthesiology

## 2022-05-22 ENCOUNTER — Encounter (HOSPITAL_COMMUNITY): Payer: Self-pay

## 2022-05-22 ENCOUNTER — Encounter (HOSPITAL_COMMUNITY)
Admission: EM | Disposition: A | Discharge status: Home / Self Care | Payer: Self-pay | Source: Home / Self Care | Attending: Emergency Medicine

## 2022-05-22 DIAGNOSIS — Z85118 Personal history of other malignant neoplasm of bronchus and lung: Secondary | ICD-10-CM | POA: Insufficient documentation

## 2022-05-22 DIAGNOSIS — M1990 Unspecified osteoarthritis, unspecified site: Secondary | ICD-10-CM | POA: Insufficient documentation

## 2022-05-22 DIAGNOSIS — F419 Anxiety disorder, unspecified: Secondary | ICD-10-CM | POA: Insufficient documentation

## 2022-05-22 DIAGNOSIS — J988 Other specified respiratory disorders: Secondary | ICD-10-CM | POA: Insufficient documentation

## 2022-05-22 DIAGNOSIS — F32A Depression, unspecified: Secondary | ICD-10-CM | POA: Diagnosis not present

## 2022-05-22 DIAGNOSIS — K222 Esophageal obstruction: Secondary | ICD-10-CM

## 2022-05-22 DIAGNOSIS — Z923 Personal history of irradiation: Secondary | ICD-10-CM | POA: Diagnosis not present

## 2022-05-22 DIAGNOSIS — K2100 Gastro-esophageal reflux disease with esophagitis, without bleeding: Secondary | ICD-10-CM | POA: Insufficient documentation

## 2022-05-22 DIAGNOSIS — R1310 Dysphagia, unspecified: Secondary | ICD-10-CM | POA: Diagnosis not present

## 2022-05-22 DIAGNOSIS — J449 Chronic obstructive pulmonary disease, unspecified: Secondary | ICD-10-CM | POA: Diagnosis not present

## 2022-05-22 DIAGNOSIS — Z7901 Long term (current) use of anticoagulants: Secondary | ICD-10-CM | POA: Insufficient documentation

## 2022-05-22 DIAGNOSIS — X58XXXA Exposure to other specified factors, initial encounter: Secondary | ICD-10-CM | POA: Diagnosis not present

## 2022-05-22 DIAGNOSIS — Z79899 Other long term (current) drug therapy: Secondary | ICD-10-CM | POA: Insufficient documentation

## 2022-05-22 DIAGNOSIS — T18128A Food in esophagus causing other injury, initial encounter: Secondary | ICD-10-CM | POA: Insufficient documentation

## 2022-05-22 DIAGNOSIS — J45909 Unspecified asthma, uncomplicated: Secondary | ICD-10-CM | POA: Diagnosis not present

## 2022-05-22 DIAGNOSIS — F418 Other specified anxiety disorders: Secondary | ICD-10-CM | POA: Diagnosis not present

## 2022-05-22 DIAGNOSIS — F17210 Nicotine dependence, cigarettes, uncomplicated: Secondary | ICD-10-CM | POA: Insufficient documentation

## 2022-05-22 HISTORY — PX: FOREIGN BODY REMOVAL: SHX962

## 2022-05-22 HISTORY — PX: ESOPHAGOGASTRODUODENOSCOPY (EGD) WITH PROPOFOL: SHX5813

## 2022-05-22 LAB — I-STAT CHEM 8, ED
BUN: 12 mg/dL (ref 6–20)
Calcium, Ion: 1.29 mmol/L (ref 1.15–1.40)
Chloride: 107 mmol/L (ref 98–111)
Creatinine, Ser: 1 mg/dL (ref 0.44–1.00)
Glucose, Bld: 92 mg/dL (ref 70–99)
HCT: 35 % — ABNORMAL LOW (ref 36.0–46.0)
Hemoglobin: 11.9 g/dL — ABNORMAL LOW (ref 12.0–15.0)
Potassium: 4.2 mmol/L (ref 3.5–5.1)
Sodium: 140 mmol/L (ref 135–145)
TCO2: 25 mmol/L (ref 22–32)

## 2022-05-22 SURGERY — ESOPHAGOGASTRODUODENOSCOPY (EGD) WITH PROPOFOL
Anesthesia: General

## 2022-05-22 MED ORDER — LIDOCAINE 2% (20 MG/ML) 5 ML SYRINGE
INTRAMUSCULAR | Status: DC | PRN
  Administered 2022-05-22: 100 mg via INTRAVENOUS

## 2022-05-22 MED ORDER — SUCCINYLCHOLINE CHLORIDE 200 MG/10ML IV SOSY
PREFILLED_SYRINGE | INTRAVENOUS | Status: DC | PRN
  Administered 2022-05-22: 140 mg via INTRAVENOUS

## 2022-05-22 MED ORDER — MIDAZOLAM HCL 2 MG/2ML IJ SOLN
INTRAMUSCULAR | Status: DC | PRN
  Administered 2022-05-22: 2 mg via INTRAVENOUS

## 2022-05-22 MED ORDER — ONDANSETRON HCL 4 MG/2ML IJ SOLN
INTRAMUSCULAR | Status: DC | PRN
  Administered 2022-05-22: 4 mg via INTRAVENOUS

## 2022-05-22 MED ORDER — LACTATED RINGERS IV SOLN: INTRAVENOUS | Status: DC | PRN

## 2022-05-22 MED ORDER — MIDAZOLAM HCL 2 MG/2ML IJ SOLN
INTRAMUSCULAR | Status: AC
  Filled 2022-05-22: qty 2

## 2022-05-22 MED ORDER — PROPOFOL 10 MG/ML IV BOLUS
INTRAVENOUS | Status: DC | PRN
  Administered 2022-05-22: 180 mg via INTRAVENOUS

## 2022-05-22 MED ORDER — PANTOPRAZOLE SODIUM 40 MG PO TBEC: 40.0000 mg | DELAYED_RELEASE_TABLET | Freq: Every day | ORAL | 3 refills | Status: DC

## 2022-05-22 MED ORDER — PANTOPRAZOLE SODIUM 40 MG PO TBEC: 40.0000 mg | DELAYED_RELEASE_TABLET | Freq: Every day | ORAL | 0 refills | Status: DC

## 2022-05-22 MED ORDER — FENTANYL CITRATE (PF) 100 MCG/2ML IJ SOLN
INTRAMUSCULAR | Status: DC | PRN
  Administered 2022-05-22 (×2): 50 ug via INTRAVENOUS

## 2022-05-22 MED ORDER — FENTANYL CITRATE (PF) 100 MCG/2ML IJ SOLN
INTRAMUSCULAR | Status: AC
  Filled 2022-05-22: qty 2

## 2022-05-22 SURGICAL SUPPLY — 15 items

## 2022-05-22 NOTE — Op Note (Signed)
[facility] Patient Name: [name] Procedure Date: 05/22/2022 MRN: [id number] Attending MD: Otis Brace , MD Date of Birth: 05-29-1971 CSN: [id number] Age: 51 Admit Type: Inpatient Procedure:                Upper GI endoscopy Indications:              Dysphagia, Foreign body in the esophagus Providers:                Otis Brace, MD, Allayne Gitelman, RN, Gloris Ham, Technician, Darliss Cheney, Technician Referring MD:              Medicines:                Sedation Administered by an Anesthesia Professional Complications:            No immediate complications. superficial esophageal                            mucosa tear Estimated Blood Loss:     Estimated blood loss was minimal. Procedure:                Pre-Anesthesia Assessment:                           - Prior to the procedure, a History and Physical                            was performed, and patient medications and                            allergies were reviewed. The patient's tolerance of                            previous anesthesia was also reviewed. The risks                            and benefits of the procedure and the sedation                            options and risks were discussed with the patient.                            All questions were answered, and informed consent                            was obtained. Prior Anticoagulants: The patient has                            taken no previous anticoagulant or antiplatelet                            agents. ASA Grade Assessment: III - A patient with  severe systemic disease. After reviewing the risks                            and benefits, the patient was deemed in                            satisfactory condition to undergo the procedure.                           After obtaining informed consent, the endoscope was                            passed under direct vision.  Throughout the                            procedure, the patient's blood pressure, pulse, and                            oxygen saturations were monitored continuously. The                            [id number]-H190 ([id number]) Olympus endoscope was introduced                            through the mouth, and advanced to the second part                            of duodenum. The upper GI endoscopy was                            accomplished without difficulty. The patient                            tolerated the procedure well. Scope In: Scope Out: Findings:      Food was found in the upper third of the esophagus. Removal of food was       accomplished by gently pushing down food bolus into the stomach. There       was superficial esophageal mucosa tear at food bolus impaction site.      LA Grade A (one or more mucosal breaks less than 5 mm, not extending       between tops of 2 mucosal folds) esophagitis was found at the       gastroesophageal junction.      No gross lesions were noted in the entire examined stomach.      The cardia and gastric fundus were normal on retroflexion.      The duodenal bulb, first portion of the duodenum and second portion of       the duodenum were normal. Impression:               - Food in the upper third of the esophagus. Removal                            was successful.                           -  LA Grade A reflux esophagitis.                           - No gross lesions in the stomach.                           - Normal duodenal bulb, first portion of the                            duodenum and second portion of the duodenum. Moderate Sedation:      Moderate (conscious) sedation was personally administered by an       anesthesia professional. The following parameters were monitored: oxygen       saturation, heart rate, blood pressure, and response to care. Recommendation:           - Patient has a contact number available for                             emergencies. The signs and symptoms of potential                            delayed complications were discussed with the                            patient. Return to normal activities tomorrow.                            Written discharge instructions were provided to the                            patient.                           - Pureed diet.                           - Continue present medications. Procedure Code(s):        --- Professional ---                           [phone], Esophagogastroduodenoscopy, flexible,                            transoral; with removal of foreign body(s) Diagnosis Code(s):        --- Professional ---                           I71.245Y, Food in esophagus causing other injury,                            initial encounter                           K21.00, Gastro-esophageal reflux disease with                            esophagitis, without bleeding  R13.10, Dysphagia, unspecified                           T18.108A, Unspecified foreign body in esophagus                            causing other injury, initial encounter CPT copyright 2019 [employer]. All rights reserved. The codes documented in this report are preliminary and upon coder review may  be revised to meet current compliance requirements. Otis Brace, MD Otis Brace, MD 05/22/2022 4:25:29 PM Number of Addenda: 0

## 2022-05-22 NOTE — Transfer of Care (Signed)
Immediate Anesthesia Transfer of Care Note  Patient: [name]  Procedure(s) Performed: ESOPHAGOGASTRODUODENOSCOPY (EGD) WITH PROPOFOL FOREIGN BODY REMOVAL  Patient Location: PACU and Endoscopy Unit  Anesthesia Type:General  Level of Consciousness: awake and alert   Airway & Oxygen Therapy: Patient Spontanous Breathing and Patient connected to face mask oxygen  Post-op Assessment: Report given to RN and Post -op Vital signs reviewed and stable  Post vital signs: Reviewed and stable  Last Vitals:  Vitals Value Taken Time  BP 111/73 05/22/22 1640  Temp    Pulse 109 05/22/22 1642  Resp 18 05/22/22 1642  SpO2 95 % 05/22/22 1642  Vitals shown include unvalidated device data.  Last Pain:  Vitals:   05/22/22 1504  TempSrc: Temporal  PainSc: 9       Patients Stated Pain Goal: 0 (25/27/12 9290)  Complications: No notable events documented.

## 2022-05-22 NOTE — ED Triage Notes (Signed)
Pt states she feels like she has food bolus stuck in her throat. Pt was eating chicken approximately one hour ago when this started. Pt unable to manage her saliva after that. Pt breathing without distress, speaking in full sentences during triage.

## 2022-05-22 NOTE — Discharge Instructions (Signed)

## 2022-05-22 NOTE — Consult Note (Signed)
Referring Provider:  EDP Primary Care Physician:  [name], PA-C Primary Gastroenterologist: Althia Forts  Reason for Consultation: Food impaction  HPI: [name] is a 51 y.o. female with past medical history of breast cancer s/p radiation, history of radiation-induced esophageal stricture, history of multiple food impaction requiring multiple EGDs presented to the hospital again with food impaction after eating chicken.  Patient already had more than 10 endoscopies for food impaction this year.  She had 2 EGDs just in this month.  Patient has been told multiple times to avoid solid food and all form of meat because of her tight esophageal stricture.  According to patient's mother, she was doing fine on soft diet so she decided to eat chicken.  Now complaining of substernal chest pain and reflux.  Past Medical History:  Diagnosis Date   Ankle sprain    left   Anxiety    treated for panic attacks in the past.   Asthma    COPD (chronic obstructive pulmonary disease) ([place])    Depression    History of radiation therapy    Right lung- 09/26/21-11/13/21- Dr. Gery Pray   Irregular heart rate    Lung cancer [facility])    Renal disorder    kidney infections    Past Surgical History:  Procedure Laterality Date   APPENDECTOMY     BALLOON DILATION N/A 03/15/2022   Procedure: BALLOON DILATION;  Surgeon: Ronnette Juniper, MD;  Location: [facility] ENDOSCOPY;  Service: Gastroenterology;  Laterality: N/A;   BRONCHIAL BIOPSY  09/21/2021   Procedure: BRONCHIAL BIOPSIES;  Surgeon: Collene Gobble, MD;  Location: [facility] ENDOSCOPY;  Service: Pulmonary;;   BRONCHIAL BRUSHINGS  09/21/2021   Procedure: BRONCHIAL BRUSHINGS;  Surgeon: Collene Gobble, MD;  Location: [facility] ENDOSCOPY;  Service: Pulmonary;;   BRONCHIAL NEEDLE ASPIRATION BIOPSY  09/21/2021   Procedure: BRONCHIAL NEEDLE ASPIRATION BIOPSIES;  Surgeon: Collene Gobble, MD;  Location: [facility] ENDOSCOPY;  Service: Pulmonary;;   ESOPHAGEAL STENT PLACEMENT N/A  03/06/2022   Procedure: ESOPHAGEAL STENT PLACEMENT;  Surgeon: Clarene Essex, MD;  Location: [facility] ENDOSCOPY;  Service: Gastroenterology;  Laterality: N/A;   ESOPHAGOGASTRODUODENOSCOPY N/A 12/26/2021   Procedure: ESOPHAGOGASTRODUODENOSCOPY (EGD);  Surgeon: Clarene Essex, MD;  Location: [facility]OS[facility];  Service: Endoscopy;  Laterality: N/A;   ESOPHAGOGASTRODUODENOSCOPY N/A 02/11/2022   Procedure: ESOPHAGOGASTRODUODENOSCOPY (EGD);  Surgeon: Arta Silence, MD;  Location: [facility] ENDOSCOPY;  Service: Gastroenterology;  Laterality: N/A;   ESOPHAGOGASTRODUODENOSCOPY N/A 03/03/2022   Procedure: ESOPHAGOGASTRODUODENOSCOPY (EGD);  Surgeon: Wilford Corner, MD;  Location: [facility] ENDOSCOPY;  Service: Gastroenterology;  Laterality: N/A;   ESOPHAGOGASTRODUODENOSCOPY N/A 03/06/2022   Procedure: ESOPHAGOGASTRODUODENOSCOPY (EGD);  Surgeon: Clarene Essex, MD;  Location: [facility] ENDOSCOPY;  Service: Gastroenterology;  Laterality: N/A;  With esophageal stent placement   ESOPHAGOGASTRODUODENOSCOPY N/A 03/15/2022   Procedure: ESOPHAGOGASTRODUODENOSCOPY (EGD);  Surgeon: Ronnette Juniper, MD;  Location: [facility] ENDOSCOPY;  Service: Gastroenterology;  Laterality: N/A;  with dilation   ESOPHAGOGASTRODUODENOSCOPY N/A 04/18/2022   Procedure: ESOPHAGOGASTRODUODENOSCOPY (EGD);  Surgeon: Clarene Essex, MD;  Location: [facility] ENDOSCOPY;  Service: Gastroenterology;  Laterality: N/A;   ESOPHAGOGASTRODUODENOSCOPY (EGD) WITH PROPOFOL N/A 01/07/2022   Procedure: ESOPHAGOGASTRODUODENOSCOPY (EGD) WITH PROPOFOL;  Surgeon: Otis Brace, MD;  Location: [facility] ENDOSCOPY;  Service: Gastroenterology;  Laterality: N/A;   ESOPHAGOGASTRODUODENOSCOPY (EGD) WITH PROPOFOL N/A 02/02/2022   Procedure: ESOPHAGOGASTRODUODENOSCOPY (EGD) WITH PROPOFOL;  Surgeon: Ronnette Juniper, MD;  Location: [facility] ENDOSCOPY;  Service: Gastroenterology;  Laterality: N/A;   ESOPHAGOGASTRODUODENOSCOPY (EGD) WITH PROPOFOL N/A 02/04/2022   Procedure: ESOPHAGOGASTRODUODENOSCOPY (EGD) WITH PROPOFOL;  Surgeon: Otis Brace,  MD;  Location: [facility] ENDOSCOPY;  Service: Gastroenterology;  Laterality: N/A;   ESOPHAGOGASTRODUODENOSCOPY (EGD) WITH PROPOFOL N/A 03/13/2022   Procedure: ESOPHAGOGASTRODUODENOSCOPY (EGD) WITH PROPOFOL;  Surgeon: Clarene Essex, MD;  Location: [facility] ENDOSCOPY;  Service: Gastroenterology;  Laterality: N/A;   ESOPHAGOGASTRODUODENOSCOPY (EGD) WITH PROPOFOL N/A 04/25/2022   Procedure: ESOPHAGOGASTRODUODENOSCOPY (EGD) WITH PROPOFOL;  Surgeon: Otis Brace, MD;  Location: [facility] ENDOS[facility];  Service: Gastroenterology;  Laterality: N/A;   ESOPHAGOGASTRODUODENOSCOPY (EGD) WITH PROPOFOL N/A 04/28/2022   Procedure: ESOPHAGOGASTRODUODENOSCOPY (EGD) WITH PROPOFOL;  Surgeon: Ronnette Juniper, MD;  Location: [facility] ENDOS[facility];  Service: Gastroenterology;  Laterality: N/A;   ESOPHAGOSCOPY N/A 04/20/2022   Procedure: ESOPHAGOSCOPY;  Surgeon: Clarene Essex, MD;  Location: [facility] ENDOSCOPY;  Service: Gastroenterology;  Laterality: N/A;   FOREIGN BODY REMOVAL  12/26/2021   Procedure: FOREIGN BODY REMOVAL;  Surgeon: Clarene Essex, MD;  Location: [facility] ENDOSCOPY;  Service: Endoscopy;;   FOREIGN BODY REMOVAL  02/02/2022   Procedure: FOREIGN BODY REMOVAL;  Surgeon: Ronnette Juniper, MD;  Location: [facility] ENDOSCOPY;  Service: Gastroenterology;;   FOREIGN BODY REMOVAL  02/11/2022   Procedure: FOREIGN BODY REMOVAL;  Surgeon: Arta Silence, MD;  Location: [facility]OS[facility];  Service: Gastroenterology;;   FOREIGN BODY REMOVAL N/A 03/03/2022   Procedure: FOREIGN BODY REMOVAL;  Surgeon: Wilford Corner, MD;  Location: [facility] ENDOSCOPY;  Service: Gastroenterology;  Laterality: N/A;   FOREIGN BODY REMOVAL  04/18/2022   Procedure: FOREIGN BODY REMOVAL;  Surgeon: Clarene Essex, MD;  Location: [facility] ENDOSCOPY;  Service: Gastroenterology;;   FOREIGN BODY REMOVAL  04/20/2022   Procedure: FOREIGN BODY REMOVAL;  Surgeon: Clarene Essex, MD;  Location: [facility] ENDOSCOPY;  Service: Gastroenterology;;  Food Impaction   FOREIGN BODY REMOVAL  04/25/2022   Procedure: FOREIGN BODY REMOVAL;  Surgeon: Otis Brace, MD;  Location: [facility] ENDOSCOPY;  Service: Gastroenterology;;  Food Impaction   FOREIGN BODY REMOVAL  04/28/2022   Procedure: FOREIGN BODY REMOVAL;  Surgeon: Ronnette Juniper, MD;  Location: [facility] ENDOSCOPY;  Service: Gastroenterology;;   IR CHEST FLUORO  12/27/2021   LUMBAR LAMINECTOMY N/A 05/13/2018   Procedure: LEFT LUMBAR FOUR-FIVE MICRODISCECTOMY;  Surgeon: Marybelle Killings, MD;  Location: [facility];  Service: Orthopedics;  Laterality: N/A;   STENT REMOVAL  03/13/2022   Procedure: STENT REMOVAL;  Surgeon: Clarene Essex, MD;  Location: [facility] ENDOSCOPY;  Service: Gastroenterology;;   TUBAL LIGATION     TUBAL LIGATION  1998   VIDEO BRONCHOSCOPY WITH ENDOBRONCHIAL ULTRASOUND N/A 09/21/2021   Procedure: VIDEO BRONCHOSCOPY WITH ENDOBRONCHIAL ULTRASOUND;  Surgeon: Collene Gobble, MD;  Location: [facility] ENDOSCOPY;  Service: Pulmonary;  Laterality: N/A;    Prior to Admission medications   Medication Sig Start Date End Date Taking? Authorizing Provider  albuterol (VENTOLIN HFA) 108 (90 Base) MCG/ACT inhaler Inhale 2 puffs into the lungs every 6 (six) hours as needed for wheezing or shortness of breath. 02/08/22   Mercy Riding, MD  apixaban (ELIQUIS) 5 MG TABS tablet Take 1 tablet (5 mg total) by mouth 2 (two) times daily. Patient not taking: Reported on 04/04/2022 03/18/22   Aline August, MD  diphenhydramine-acetaminophen (TYLENOL PM) 25-500 MG TABS tablet Take 3 tablets by mouth at bedtime as needed (for sleep).    [provider]  folic acid (FOLVITE) 1 MG tablet Take 1 tablet (1 mg total) by mouth daily. Patient not taking: Reported on 03/03/2022 02/09/22   Mercy Riding, MD  Multiple Vitamin (MULTIVITAMIN) LIQD Take 15 mLs by mouth daily. Patient not taking: Reported on 03/03/2022 02/08/22   Mercy Riding, MD  nicotine (NICODERM CQ - DOSED IN MG/24 HOURS) 21 mg/24hr patch Place 1 patch (21 mg total) onto the skin daily. Patient not taking: Reported on 04/04/2022 03/07/22   Oswald Hillock, MD  ondansetron (ZOFRAN-ODT)  8 MG disintegrating tablet Dissolve 1 tablet (8 mg total) by mouth every 8 (eight) hours as needed for nausea or vomiting. Patient not taking: Reported on 04/04/2022 02/08/22   Mercy Riding, MD  oxyCODONE (OXY IR/ROXICODONE) 5 MG immediate release tablet Take 1 tablet (5 mg total) by mouth every 6 (six) hours as needed for severe pain. Patient not taking: Reported on 04/04/2022 03/07/22   Oswald Hillock, MD  pantoprazole (PROTONIX) 40 MG tablet Take 1 tablet (40 mg total) by mouth 2 (two) times daily. Patient not taking: Reported on 03/13/2022 03/07/22 06/05/22  Oswald Hillock, MD  potassium chloride (KLOR-CON) 20 MEQ packet Dilute the contents of 1 packet in 4 oz of water and drink by mouth 2  times daily for 5 days. 04/18/22 04/23/22  Pattricia Boss, MD  sucralfate (CARAFATE) 1 GM/10ML suspension Take 10 mLs (1 g total) by mouth 4 (four) times daily -  with meals and at bedtime. Patient not taking: Reported on 04/04/2022 03/07/22   Oswald Hillock, MD  tiotropium (SPIRIVA HANDIHALER) 18 MCG inhalation capsule Place 1 capsule (18 mcg total) into inhaler and inhale daily. Patient not taking: Reported on 03/03/2022 02/08/22 05/09/22  Mercy Riding, MD    Scheduled Meds:   Continuous Infusions:   PRN Meds:.  Allergies as of 05/22/2022 - Review Complete 05/22/2022  Allergen Reaction Noted   Compazine [prochlorperazine] Other (See Comments) 03/16/2022   Naproxen Nausea Only 09/10/2021   Bactrim [sulfamethoxazole-trimethoprim] Nausea Only 04/06/2012    Family History  Problem Relation Age of Onset   Hypertension Mother    Hypertension Father    Heart disease Father     Social History   Socioeconomic History   Marital status: Divorced    Spouse name: Not on file   Number of children: Not on file   Years of education: Not on file   Highest education level: Not on file  Occupational History   Not on file  Tobacco Use   Smoking status: Every Day    Packs/day: 0.50    Years: 30.00    Total pack  years: 15.00    Types: Cigarettes   Smokeless tobacco: Never  Vaping Use   Vaping Use: Some days   Substances: Flavoring  Substance and Sexual Activity   Alcohol use: Not Currently    Comment: rare   Drug use: Not Currently    Types: Marijuana    Comment: occ-   Sexual activity: Not on file  Other Topics Concern   Not on file  Social History Narrative   Not on file   Social Determinants of Health   Financial Resource Strain: Not on file  Food Insecurity: Not on file  Transportation Needs: Not on file  Physical Activity: Not on file  Stress: Not on file  Social Connections: Not on file  Intimate Partner Violence: Not on file    Review of Systems: All negative except as stated above in HPI.  Physical Exam: Vital signs: Vitals:   05/22/22 1235  BP: 132/88  Pulse: (!) 122  Resp: 17  Temp: 98.7 F (37.1 C)  SpO2: 97%     General:   Alert,  Well-developed, well-nourished, pleasant and cooperative in NAD Lungs:  Clear throughout to auscultation.  No wheezes, crackles, or rhonchi. No acute distress. Heart:  Regular rate and rhythm; no murmurs, clicks, rubs,  or gallops. Abdomen: Soft, nontender, nondistended, bowel sounds present.  No peritoneal signs Rectal:  Deferred  GI:  Lab Results: No results for input(s): "WBC", "HGB", "HCT", "PLT" in the last 72 hours. BMET No results for input(s): "NA", "K", "CL", "CO2", "GLUCOSE", "BUN", "CREATININE", "CALCIUM" in the last 72 hours. LFT No results for input(s): "PROT", "ALBUMIN", "AST", "ALT", "ALKPHOS", "BILITOT", "BILIDIR", "IBILI" in the last 72 hours. PT/INR No results for input(s): "LABPROT", "INR" in the last 72 hours.   Studies/Results: No results found.  Impression/Plan: -Food impaction -History of radiation-induced upper esophageal stricture -Breast cancer s/p radiation  Recommendations ------------------------- -Plan for EGD with food bolus removal today -Patient was advised again not to eat solid  food or any meat products because of her severe esophageal stricture resulting in frequent food impaction.  Risks (bleeding, infection, bowel perforation that could require surgery, sedation-related changes in cardiopulmonary systems), benefits (identification and possible treatment of source of symptoms, exclusion of certain causes of symptoms), and alternatives (watchful waiting, radiographic imaging studies, empiric medical treatment)  were explained to patient/family in detail and patient wishes to proceed.    LOS: 0 days   Otis Brace  MD, FACP 05/22/2022, 1:05 PM  Contact #  [phone]

## 2022-05-22 NOTE — Anesthesia Procedure Notes (Signed)
Date/Time: 05/22/2022 4:25 PM  Performed by: Cynda Familia, CRNAOxygen Delivery Method: Simple face mask Placement Confirmation: positive ETCO2 and breath sounds checked- equal and bilateral Dental Injury: Teeth and Oropharynx as per pre-operative assessment

## 2022-05-22 NOTE — ED Provider Notes (Signed)
[facility] DEPT Provider Note   CSN: [id number] Arrival date & time: 05/22/22  1224     History  Chief Complaint  Patient presents with   Airway Obstruction    [name] is a 51 y.o. female.  HPI 51 year old female known history of esophageal strictures presents today after eating chicken with feeling like she is having an obstruction.  She has been unable to swallow water for the past hour.      Home Medications Prior to Admission medications   Medication Sig Start Date End Date Taking? Authorizing Provider  albuterol (VENTOLIN HFA) 108 (90 Base) MCG/ACT inhaler Inhale 2 puffs into the lungs every 6 (six) hours as needed for wheezing or shortness of breath. 02/08/22   Mercy Riding, MD  apixaban (ELIQUIS) 5 MG TABS tablet Take 1 tablet (5 mg total) by mouth 2 (two) times daily. Patient not taking: Reported on 04/04/2022 03/18/22   Aline August, MD  diphenhydramine-acetaminophen (TYLENOL PM) 25-500 MG TABS tablet Take 2 tablets by mouth at bedtime as needed (for sleep).    [provider]  fluticasone (FLONASE) 50 MCG/ACT nasal spray Place 1 spray into both nostrils 2 (two) times daily as needed for allergies or rhinitis.    [provider]  folic acid (FOLVITE) 1 MG tablet Take 1 tablet (1 mg total) by mouth daily. Patient not taking: Reported on 03/03/2022 02/09/22   Mercy Riding, MD  HYDROcodone-acetaminophen (NORCO/VICODIN) 5-325 MG tablet Take 1 tablet by mouth every 6 (six) hours as needed for severe pain. 05/11/22   Tedd Sias, PA  Multiple Vitamin (MULTIVITAMIN) LIQD Take 15 mLs by mouth daily. Patient not taking: Reported on 03/03/2022 02/08/22   Mercy Riding, MD  nicotine (NICODERM CQ - DOSED IN MG/24 HOURS) 21 mg/24hr patch Place 1 patch (21 mg total) onto the skin daily. Patient not taking: Reported on 04/04/2022 03/07/22   Oswald Hillock, MD  ondansetron (ZOFRAN-ODT) 8 MG disintegrating tablet Dissolve 1 tablet (8  mg total) by mouth every 8 (eight) hours as needed for nausea or vomiting. Patient not taking: Reported on 04/04/2022 02/08/22   Mercy Riding, MD  oxyCODONE (OXY IR/ROXICODONE) 5 MG immediate release tablet Take 1 tablet (5 mg total) by mouth every 6 (six) hours as needed for severe pain. Patient not taking: Reported on 04/04/2022 03/07/22   Oswald Hillock, MD  pantoprazole (PROTONIX) 40 MG tablet Take 1 tablet by mouth daily. Patient not taking: Reported on 04/28/2022 04/25/22 07/24/22  Otis Brace, MD  potassium chloride (KLOR-CON) 20 MEQ packet Dilute the contents of 1 packet in 4 oz of water and drink by mouth 2  times daily for 5 days. Patient not taking: Reported on 04/28/2022 04/18/22 04/23/22  Pattricia Boss, MD  sucralfate (CARAFATE) 1 GM/10ML suspension Take 10 mLs (1 g total) by mouth 4 (four) times daily -  with meals and at bedtime. Patient taking differently: Take 1 g by mouth 3 (three) times daily as needed (esophageal irritation). 03/07/22   Oswald Hillock, MD  tiotropium (SPIRIVA HANDIHALER) 18 MCG inhalation capsule Place 1 capsule (18 mcg total) into inhaler and inhale daily. Patient not taking: Reported on 03/03/2022 02/08/22 05/09/22  Mercy Riding, MD      Allergies    Compazine [prochlorperazine], Naproxen, and Bactrim [sulfamethoxazole-trimethoprim]    Review of Systems   Review of Systems  Physical Exam Updated Vital Signs BP (!) 111/94 (BP Location: Right Arm)   Pulse  99   Temp 98.5 F (36.9 C) (Oral)   Resp 17   LMP 04/24/2018   SpO2 99%  Physical Exam Vitals and nursing note reviewed.  Constitutional:      Appearance: Normal appearance.  HENT:     Head: Normocephalic.     Right Ear: External ear normal.     Left Ear: External ear normal.     Nose: Nose normal.     Mouth/Throat:     Pharynx: Oropharynx is clear.  Eyes:     Pupils: Pupils are equal, round, and reactive to light.  Cardiovascular:     Rate and Rhythm: Normal rate and regular rhythm.     Pulses:  Normal pulses.  Pulmonary:     Effort: Pulmonary effort is normal.     Breath sounds: Normal breath sounds.  Abdominal:     General: Abdomen is flat. Bowel sounds are normal.     Palpations: Abdomen is soft.  Musculoskeletal:        General: Normal range of motion.     Comments: Lle with cam walker in place  Skin:    General: Skin is warm.     Capillary Refill: Capillary refill takes less than 2 seconds.  Neurological:     General: No focal deficit present.     Mental Status: She is alert.  Psychiatric:        Mood and Affect: Mood normal.     ED Results / Procedures / Treatments   Labs (all labs ordered are listed, but only abnormal results are displayed) Labs Reviewed  I-STAT CHEM 8, ED    EKG None  Radiology No results found.  Procedures Procedures    Medications Ordered in ED Medications - No data to display  ED Course/ Medical Decision Making/ A&P                           Medical Decision Making 51 yo female ho lung ca, esophageal obstruction due to radiation, presents with co food impaction.  RN note states airway obstruction.  Patient states eating chicken and feel like food impaction mid chest.  Ho multiple in past.  States [facility] has cared for her before. Care discussed with Dr. Luan Pulling Emphasized to patient that she should not eat any meat and this includes chicken          Final Clinical Impression(s) / ED Diagnoses Final diagnoses:  Esophageal obstruction due to food impaction    Rx / DC Orders ED Discharge Orders     None         Pattricia Boss, MD 05/22/22 1623

## 2022-05-22 NOTE — Anesthesia Preprocedure Evaluation (Signed)
Anesthesia Evaluation  Patient identified by MRN, date of birth, ID band Patient awake    Reviewed: Allergy & Precautions, NPO status , Patient's Chart, lab work & pertinent test results  History of Anesthesia Complications Negative for: history of anesthetic complications  Airway Mallampati: II  TM Distance: >3 FB Neck ROM: Full    Dental  (+) Partial Upper, Missing,    Pulmonary asthma , pneumonia, resolved, COPD,  COPD inhaler, Current SmokerPatient did not abstain from smoking.,  Hx/o Lung Ca S/P RT   Pulmonary exam normal        Cardiovascular negative cardio ROS Normal cardiovascular exam     Neuro/Psych PSYCHIATRIC DISORDERS Anxiety Depression  Neuromuscular disease    GI/Hepatic Neg liver ROS, Hx/o esophageal stricture S/P balloon dilation Food impaction   Endo/Other  negative endocrine ROS  Renal/GU Renal disease  negative genitourinary   Musculoskeletal  (+) Arthritis , Osteoarthritis,    Abdominal Normal abdominal exam  (+)   Peds  Hematology  (+) Blood dyscrasia, anemia ,   Anesthesia Other Findings   Reproductive/Obstetrics                             Anesthesia Physical  Anesthesia Plan  ASA: 3  Anesthesia Plan: General   Post-op Pain Management: Minimal or no pain anticipated   Induction: Intravenous, Rapid sequence and Cricoid pressure planned  PONV Risk Score and Plan: 2 and Ondansetron and Treatment may vary due to age or medical condition  Airway Management Planned: Oral ETT  Additional Equipment: None  Intra-op Plan:   Post-operative Plan: Extubation in OR  Informed Consent: I have reviewed the patients History and Physical, chart, labs and discussed the procedure including the risks, benefits and alternatives for the proposed anesthesia with the patient or authorized representative who has indicated his/her understanding and acceptance.     Dental  advisory given  Plan Discussed with: CRNA  Anesthesia Plan Comments:         Anesthesia Quick Evaluation

## 2022-05-22 NOTE — Anesthesia Procedure Notes (Signed)
Procedure Name: Intubation Date/Time: 05/22/2022 4:10 PM  Performed by: Cynda Familia, CRNAPre-anesthesia Checklist: Patient identified, Emergency Drugs available, Suction available and Patient being monitored Patient Re-evaluated:Patient Re-evaluated prior to induction Oxygen Delivery Method: Circle System Utilized Preoxygenation: Pre-oxygenation with 100% oxygen Induction Type: IV induction and Rapid sequence Ventilation: Mask ventilation without difficulty Laryngoscope Size: Miller and 2 Tube type: Oral Number of attempts: 1 Airway Equipment and Method: Stylet and Oral airway Placement Confirmation: ETT inserted through vocal cords under direct vision, positive ETCO2 and breath sounds checked- equal and bilateral Tube secured with: Tape Dental Injury: Teeth and Oropharynx as per pre-operative assessment  Comments: IV induction Hatchette-- intubation AM CRNA atraumatic-- teeth and mouth as preop- very poor dentition- many missing teeth- bilat BS

## 2022-05-23 ENCOUNTER — Encounter (HOSPITAL_COMMUNITY): Payer: Self-pay | Admitting: Gastroenterology

## 2022-05-24 NOTE — Anesthesia Postprocedure Evaluation (Signed)
Anesthesia Post Note  Patient: [name]  Procedure(s) Performed: ESOPHAGOGASTRODUODENOSCOPY (EGD) WITH PROPOFOL FOREIGN BODY REMOVAL     Patient location during evaluation: Endoscopy Anesthesia Type: General Level of consciousness: awake Pain management: pain level controlled Vital Signs Assessment: post-procedure vital signs reviewed and stable Respiratory status: spontaneous breathing Cardiovascular status: stable Postop Assessment: no apparent nausea or vomiting Anesthetic complications: no   No notable events documented.  Last Vitals:  Vitals:   05/22/22 1651 05/22/22 1655  BP: 112/89 (!) 128/91  Pulse: (!) 111 (!) 108  Resp: (!) 22 (!) 22  Temp:    SpO2: 96% 93%    Last Pain:  Vitals:   05/22/22 1655  TempSrc:   PainSc: 0-No pain                 Huston Foley

## 2022-06-14 ENCOUNTER — Emergency Department (HOSPITAL_COMMUNITY): Payer: Medicaid Other | Admitting: Certified Registered Nurse Anesthetist

## 2022-06-14 ENCOUNTER — Encounter (HOSPITAL_COMMUNITY): Payer: Self-pay

## 2022-06-14 ENCOUNTER — Other Ambulatory Visit: Payer: Self-pay

## 2022-06-14 ENCOUNTER — Encounter (HOSPITAL_COMMUNITY)
Admission: EM | Disposition: A | Discharge status: Home / Self Care | Payer: Self-pay | Source: Home / Self Care | Attending: Emergency Medicine

## 2022-06-14 ENCOUNTER — Ambulatory Visit (HOSPITAL_COMMUNITY)
Admission: EM | Admit: 2022-06-14 | Discharge: 2022-06-14 | Disposition: A | Discharge status: Home / Self Care | Payer: Medicaid Other | Attending: Emergency Medicine | Admitting: Emergency Medicine

## 2022-06-14 ENCOUNTER — Emergency Department (EMERGENCY_DEPARTMENT_HOSPITAL): Payer: Medicaid Other | Admitting: Certified Registered Nurse Anesthetist

## 2022-06-14 DIAGNOSIS — K2970 Gastritis, unspecified, without bleeding: Secondary | ICD-10-CM | POA: Diagnosis not present

## 2022-06-14 DIAGNOSIS — Z923 Personal history of irradiation: Secondary | ICD-10-CM | POA: Insufficient documentation

## 2022-06-14 DIAGNOSIS — F17210 Nicotine dependence, cigarettes, uncomplicated: Secondary | ICD-10-CM | POA: Insufficient documentation

## 2022-06-14 DIAGNOSIS — R1310 Dysphagia, unspecified: Secondary | ICD-10-CM | POA: Insufficient documentation

## 2022-06-14 DIAGNOSIS — T18128A Food in esophagus causing other injury, initial encounter: Secondary | ICD-10-CM | POA: Insufficient documentation

## 2022-06-14 DIAGNOSIS — K222 Esophageal obstruction: Secondary | ICD-10-CM | POA: Insufficient documentation

## 2022-06-14 DIAGNOSIS — T189XXA Foreign body of alimentary tract, part unspecified, initial encounter: Secondary | ICD-10-CM | POA: Diagnosis present

## 2022-06-14 DIAGNOSIS — X58XXXA Exposure to other specified factors, initial encounter: Secondary | ICD-10-CM | POA: Diagnosis not present

## 2022-06-14 DIAGNOSIS — K2100 Gastro-esophageal reflux disease with esophagitis, without bleeding: Secondary | ICD-10-CM | POA: Diagnosis not present

## 2022-06-14 DIAGNOSIS — Z853 Personal history of malignant neoplasm of breast: Secondary | ICD-10-CM | POA: Insufficient documentation

## 2022-06-14 DIAGNOSIS — Z7901 Long term (current) use of anticoagulants: Secondary | ICD-10-CM | POA: Diagnosis not present

## 2022-06-14 DIAGNOSIS — J449 Chronic obstructive pulmonary disease, unspecified: Secondary | ICD-10-CM | POA: Diagnosis not present

## 2022-06-14 DIAGNOSIS — Z85118 Personal history of other malignant neoplasm of bronchus and lung: Secondary | ICD-10-CM | POA: Insufficient documentation

## 2022-06-14 HISTORY — PX: ESOPHAGOGASTRODUODENOSCOPY (EGD) WITH PROPOFOL: SHX5813

## 2022-06-14 HISTORY — PX: FOREIGN BODY REMOVAL: SHX962

## 2022-06-14 SURGERY — ESOPHAGOGASTRODUODENOSCOPY (EGD) WITH PROPOFOL
Anesthesia: General

## 2022-06-14 MED ORDER — LACTATED RINGERS IV SOLN
INTRAVENOUS | Status: AC | PRN
  Administered 2022-06-14: 10 mL/h via INTRAVENOUS

## 2022-06-14 MED ORDER — SODIUM CHLORIDE 0.9 % IV SOLN: INTRAVENOUS | Status: DC

## 2022-06-14 MED ORDER — ONDANSETRON HCL 4 MG/2ML IJ SOLN
INTRAMUSCULAR | Status: DC | PRN
  Administered 2022-06-14: 4 mg via INTRAVENOUS

## 2022-06-14 MED ORDER — ONDANSETRON HCL 4 MG/2ML IJ SOLN: 4.0000 mg | Freq: Once | INTRAMUSCULAR | Status: DC | PRN

## 2022-06-14 MED ORDER — FENTANYL CITRATE (PF) 100 MCG/2ML IJ SOLN: 50.0000 ug | INTRAMUSCULAR | Status: DC | PRN

## 2022-06-14 MED ORDER — PROPOFOL 10 MG/ML IV BOLUS
INTRAVENOUS | Status: DC | PRN
  Administered 2022-06-14: 120 mg via INTRAVENOUS

## 2022-06-14 MED ORDER — GLUCAGON HCL RDNA (DIAGNOSTIC) 1 MG IJ SOLR
1.0000 mg | Freq: Once | INTRAMUSCULAR | Status: AC
  Administered 2022-06-14: 1 mg via INTRAVENOUS
  Filled 2022-06-14: qty 1

## 2022-06-14 MED ORDER — LIDOCAINE 2% (20 MG/ML) 5 ML SYRINGE
INTRAMUSCULAR | Status: DC | PRN
  Administered 2022-06-14: 60 mg via INTRAVENOUS

## 2022-06-14 MED ORDER — SUCCINYLCHOLINE CHLORIDE 200 MG/10ML IV SOSY
PREFILLED_SYRINGE | INTRAVENOUS | Status: DC | PRN
  Administered 2022-06-14: 100 mg via INTRAVENOUS

## 2022-06-14 SURGICAL SUPPLY — 15 items

## 2022-06-14 NOTE — Consult Note (Signed)
Referring Provider:  EDP Primary Care Physician:  [name], PA-C Primary Gastroenterologist: Althia Forts  Reason for Consultation: Food impaction  HPI: [name] is a 51 y.o. female with past medical history of breast cancer s/p radiation, history of radiation-induced esophageal stricture, history of multiple food impaction requiring multiple EGDs presented to the hospital again with food impaction after eating broccoli and cheese.  Patient already had more than 10 endoscopies for food impaction this year.  Last EGD  on May 22, 2022.   Past Medical History:  Diagnosis Date   Ankle sprain    left   Anxiety    treated for panic attacks in the past.   Asthma    COPD (chronic obstructive pulmonary disease) ([place])    Depression    History of radiation therapy    Right lung- 09/26/21-11/13/21- Dr. Gery Pray   Irregular heart rate    Lung cancer [facility])    Renal disorder    kidney infections    Past Surgical History:  Procedure Laterality Date   APPENDECTOMY     BALLOON DILATION N/A 03/15/2022   Procedure: BALLOON DILATION;  Surgeon: Ronnette Juniper, MD;  Location: [facility] ENDOSCOPY;  Service: Gastroenterology;  Laterality: N/A;   BRONCHIAL BIOPSY  09/21/2021   Procedure: BRONCHIAL BIOPSIES;  Surgeon: Collene Gobble, MD;  Location: [facility] ENDOSCOPY;  Service: Pulmonary;;   BRONCHIAL BRUSHINGS  09/21/2021   Procedure: BRONCHIAL BRUSHINGS;  Surgeon: Collene Gobble, MD;  Location: [facility] ENDOSCOPY;  Service: Pulmonary;;   BRONCHIAL NEEDLE ASPIRATION BIOPSY  09/21/2021   Procedure: BRONCHIAL NEEDLE ASPIRATION BIOPSIES;  Surgeon: Collene Gobble, MD;  Location: [facility] ENDOSCOPY;  Service: Pulmonary;;   ESOPHAGEAL STENT PLACEMENT N/A 03/06/2022   Procedure: ESOPHAGEAL STENT PLACEMENT;  Surgeon: Clarene Essex, MD;  Location: [facility] ENDOSCOPY;  Service: Gastroenterology;  Laterality: N/A;   ESOPHAGOGASTRODUODENOSCOPY N/A 12/26/2021   Procedure: ESOPHAGOGASTRODUODENOSCOPY (EGD);  Surgeon: Clarene Essex,  MD;  Location: [facility] ENDOSCOPY;  Service: Endoscopy;  Laterality: N/A;   ESOPHAGOGASTRODUODENOSCOPY N/A 02/11/2022   Procedure: ESOPHAGOGASTRODUODENOSCOPY (EGD);  Surgeon: Arta Silence, MD;  Location: [facility] ENDOSCOPY;  Service: Gastroenterology;  Laterality: N/A;   ESOPHAGOGASTRODUODENOSCOPY N/A 03/03/2022   Procedure: ESOPHAGOGASTRODUODENOSCOPY (EGD);  Surgeon: Wilford Corner, MD;  Location: [facility] ENDOSCOPY;  Service: Gastroenterology;  Laterality: N/A;   ESOPHAGOGASTRODUODENOSCOPY N/A 03/06/2022   Procedure: ESOPHAGOGASTRODUODENOSCOPY (EGD);  Surgeon: Clarene Essex, MD;  Location: [facility] ENDOSCOPY;  Service: Gastroenterology;  Laterality: N/A;  With esophageal stent placement   ESOPHAGOGASTRODUODENOSCOPY N/A 03/15/2022   Procedure: ESOPHAGOGASTRODUODENOSCOPY (EGD);  Surgeon: Ronnette Juniper, MD;  Location: [facility] ENDOSCOPY;  Service: Gastroenterology;  Laterality: N/A;  with dilation   ESOPHAGOGASTRODUODENOSCOPY N/A 04/18/2022   Procedure: ESOPHAGOGASTRODUODENOSCOPY (EGD);  Surgeon: Clarene Essex, MD;  Location: [facility] ENDOSCOPY;  Service: Gastroenterology;  Laterality: N/A;   ESOPHAGOGASTRODUODENOSCOPY (EGD) WITH PROPOFOL N/A 01/07/2022   Procedure: ESOPHAGOGASTRODUODENOSCOPY (EGD) WITH PROPOFOL;  Surgeon: Otis Brace, MD;  Location: [facility] ENDOSCOPY;  Service: Gastroenterology;  Laterality: N/A;   ESOPHAGOGASTRODUODENOSCOPY (EGD) WITH PROPOFOL N/A 02/02/2022   Procedure: ESOPHAGOGASTRODUODENOSCOPY (EGD) WITH PROPOFOL;  Surgeon: Ronnette Juniper, MD;  Location: [facility] ENDOSCOPY;  Service: Gastroenterology;  Laterality: N/A;   ESOPHAGOGASTRODUODENOSCOPY (EGD) WITH PROPOFOL N/A 02/04/2022   Procedure: ESOPHAGOGASTRODUODENOSCOPY (EGD) WITH PROPOFOL;  Surgeon: Otis Brace, MD;  Location: [facility] ENDOSCOPY;  Service: Gastroenterology;  Laterality: N/A;   ESOPHAGOGASTRODUODENOSCOPY (EGD) WITH PROPOFOL N/A 03/13/2022   Procedure: ESOPHAGOGASTRODUODENOSCOPY (EGD) WITH PROPOFOL;  Surgeon: Clarene Essex, MD;  Location: [facility] ENDOSCOPY;  Service:  Gastroenterology;  Laterality: N/A;   ESOPHAGOGASTRODUODENOSCOPY (EGD) WITH PROPOFOL  N/A 04/25/2022   Procedure: ESOPHAGOGASTRODUODENOSCOPY (EGD) WITH PROPOFOL;  Surgeon: Otis Brace, MD;  Location: [facility] ENDOSCOPY;  Service: Gastroenterology;  Laterality: N/A;   ESOPHAGOGASTRODUODENOSCOPY (EGD) WITH PROPOFOL N/A 04/28/2022   Procedure: ESOPHAGOGASTRODUODENOSCOPY (EGD) WITH PROPOFOL;  Surgeon: Ronnette Juniper, MD;  Location: [facility] ENDOSCOPY;  Service: Gastroenterology;  Laterality: N/A;   ESOPHAGOGASTRODUODENOSCOPY (EGD) WITH PROPOFOL N/A 05/22/2022   Procedure: ESOPHAGOGASTRODUODENOSCOPY (EGD) WITH PROPOFOL;  Surgeon: Otis Brace, MD;  Location: [facility] ENDOSCOPY;  Service: Gastroenterology;  Laterality: N/A;   ESOPHAGOSCOPY N/A 04/20/2022   Procedure: ESOPHAGOSCOPY;  Surgeon: Clarene Essex, MD;  Location: [facility] ENDOSCOPY;  Service: Gastroenterology;  Laterality: N/A;   FOREIGN BODY REMOVAL  12/26/2021   Procedure: FOREIGN BODY REMOVAL;  Surgeon: Clarene Essex, MD;  Location: [facility] ENDOSCOPY;  Service: Endoscopy;;   FOREIGN BODY REMOVAL  02/02/2022   Procedure: FOREIGN BODY REMOVAL;  Surgeon: Ronnette Juniper, MD;  Location: [facility] ENDOSCOPY;  Service: Gastroenterology;;   FOREIGN BODY REMOVAL  02/11/2022   Procedure: FOREIGN BODY REMOVAL;  Surgeon: Arta Silence, MD;  Location: [facility] ENDOSCOPY;  Service: Gastroenterology;;   FOREIGN BODY REMOVAL N/A 03/03/2022   Procedure: FOREIGN BODY REMOVAL;  Surgeon: Wilford Corner, MD;  Location: [facility] ENDOSCOPY;  Service: Gastroenterology;  Laterality: N/A;   FOREIGN BODY REMOVAL  04/18/2022   Procedure: FOREIGN BODY REMOVAL;  Surgeon: Clarene Essex, MD;  Location: [facility] ENDOSCOPY;  Service: Gastroenterology;;   FOREIGN BODY REMOVAL  04/20/2022   Procedure: FOREIGN BODY REMOVAL;  Surgeon: Clarene Essex, MD;  Location: [facility] ENDOSCOPY;  Service: Gastroenterology;;  Food Impaction   FOREIGN BODY REMOVAL  04/25/2022   Procedure: FOREIGN BODY REMOVAL;  Surgeon: Otis Brace, MD;  Location: [facility]  ENDOSCOPY;  Service: Gastroenterology;;  Food Impaction   FOREIGN BODY REMOVAL  04/28/2022   Procedure: FOREIGN BODY REMOVAL;  Surgeon: Ronnette Juniper, MD;  Location: [facility] ENDOSCOPY;  Service: Gastroenterology;;   FOREIGN BODY REMOVAL  05/22/2022   Procedure: FOREIGN BODY REMOVAL;  Surgeon: Otis Brace, MD;  Location: [facility] ENDOSCOPY;  Service: Gastroenterology;;   IR CHEST FLUORO  12/27/2021   LUMBAR LAMINECTOMY N/A 05/13/2018   Procedure: LEFT LUMBAR FOUR-FIVE MICRODISCECTOMY;  Surgeon: Marybelle Killings, MD;  Location: [facility];  Service: Orthopedics;  Laterality: N/A;   STENT REMOVAL  03/13/2022   Procedure: STENT REMOVAL;  Surgeon: Clarene Essex, MD;  Location: [facility];  Service: Gastroenterology;;   TUBAL LIGATION     TUBAL LIGATION  1998   VIDEO BRONCHOSCOPY WITH ENDOBRONCHIAL ULTRASOUND N/A 09/21/2021   Procedure: VIDEO BRONCHOSCOPY WITH ENDOBRONCHIAL ULTRASOUND;  Surgeon: Collene Gobble, MD;  Location: [facility] ENDOSCOPY;  Service: Pulmonary;  Laterality: N/A;    Prior to Admission medications   Medication Sig Start Date End Date Taking? Authorizing Provider  albuterol (VENTOLIN HFA) 108 (90 Base) MCG/ACT inhaler Inhale 2 puffs into the lungs every 6 (six) hours as needed for wheezing or shortness of breath. 02/08/22   Mercy Riding, MD  apixaban (ELIQUIS) 5 MG TABS tablet Take 1 tablet (5 mg total) by mouth 2 (two) times daily. Patient not taking: Reported on 04/04/2022 03/18/22   Aline August, MD  diphenhydramine-acetaminophen (TYLENOL PM) 25-500 MG TABS tablet Take 3 tablets by mouth at bedtime as needed (for sleep).    [provider]  folic acid (FOLVITE) 1 MG tablet Take 1 tablet (1 mg total) by mouth daily. Patient not taking: Reported on 03/03/2022 02/09/22   Mercy Riding, MD  Multiple Vitamin (MULTIVITAMIN) LIQD Take 15 mLs by mouth daily. Patient not taking: Reported on  03/03/2022 02/08/22   Mercy Riding, MD  nicotine (NICODERM CQ - DOSED IN MG/24 HOURS) 21 mg/24hr patch Place 1 patch  (21 mg total) onto the skin daily. Patient not taking: Reported on 04/04/2022 03/07/22   Oswald Hillock, MD  ondansetron (ZOFRAN-ODT) 8 MG disintegrating tablet Dissolve 1 tablet (8 mg total) by mouth every 8 (eight) hours as needed for nausea or vomiting. Patient not taking: Reported on 04/04/2022 02/08/22   Mercy Riding, MD  oxyCODONE (OXY IR/ROXICODONE) 5 MG immediate release tablet Take 1 tablet (5 mg total) by mouth every 6 (six) hours as needed for severe pain. Patient not taking: Reported on 04/04/2022 03/07/22   Oswald Hillock, MD  pantoprazole (PROTONIX) 40 MG tablet Take 1 tablet (40 mg total) by mouth 2 (two) times daily. Patient not taking: Reported on 03/13/2022 03/07/22 06/05/22  Oswald Hillock, MD  potassium chloride (KLOR-CON) 20 MEQ packet Dilute the contents of 1 packet in 4 oz of water and drink by mouth 2  times daily for 5 days. 04/18/22 04/23/22  Pattricia Boss, MD  sucralfate (CARAFATE) 1 GM/10ML suspension Take 10 mLs (1 g total) by mouth 4 (four) times daily -  with meals and at bedtime. Patient not taking: Reported on 04/04/2022 03/07/22   Oswald Hillock, MD  tiotropium (SPIRIVA HANDIHALER) 18 MCG inhalation capsule Place 1 capsule (18 mcg total) into inhaler and inhale daily. Patient not taking: Reported on 03/03/2022 02/08/22 05/09/22  Mercy Riding, MD    Scheduled Meds:   Continuous Infusions:   PRN Meds:.  Allergies as of 06/14/2022 - Review Complete 06/14/2022  Allergen Reaction Noted   Compazine [prochlorperazine] Other (See Comments) 03/16/2022   Naproxen Nausea Only 09/10/2021   Bactrim [sulfamethoxazole-trimethoprim] Nausea Only 04/06/2012    Family History  Problem Relation Age of Onset   Hypertension Mother    Hypertension Father    Heart disease Father     Social History   Socioeconomic History   Marital status: Divorced    Spouse name: Not on file   Number of children: Not on file   Years of education: Not on file   Highest education level: Not on file   Occupational History   Not on file  Tobacco Use   Smoking status: Every Day    Packs/day: 0.50    Years: 30.00    Total pack years: 15.00    Types: Cigarettes   Smokeless tobacco: Never  Vaping Use   Vaping Use: Some days   Substances: Flavoring  Substance and Sexual Activity   Alcohol use: Not Currently    Comment: rare   Drug use: Not Currently    Types: Marijuana    Comment: occ-   Sexual activity: Not on file  Other Topics Concern   Not on file  Social History Narrative   Not on file   Social Determinants of Health   Financial Resource Strain: Not on file  Food Insecurity: Not on file  Transportation Needs: Not on file  Physical Activity: Not on file  Stress: Not on file  Social Connections: Not on file  Intimate Partner Violence: Not on file    Review of Systems: All negative except as stated above in HPI.  Physical Exam: Vital signs: Vitals:   06/14/22 2130 06/14/22 2201  BP: 119/86 117/83  Pulse: 99 (!) 105  Resp: 18 (!) 24  Temp:  98.1 F (36.7 C)  SpO2: 96% 96%     General:  Alert,  Well-developed, well-nourished, pleasant and cooperative in NAD Lungs:  Clear throughout to auscultation.   No wheezes, crackles, or rhonchi. No acute distress. Heart:  Regular rate and rhythm; no murmurs, clicks, rubs,  or gallops. Abdomen: Soft, nontender, nondistended, bowel sounds present.  No peritoneal signs Rectal:  Deferred  GI:  Lab Results: No results for input(s): "WBC", "HGB", "HCT", "PLT" in the last 72 hours. BMET No results for input(s): "NA", "K", "CL", "CO2", "GLUCOSE", "BUN", "CREATININE", "CALCIUM" in the last 72 hours. LFT No results for input(s): "PROT", "ALBUMIN", "AST", "ALT", "ALKPHOS", "BILITOT", "BILIDIR", "IBILI" in the last 72 hours. PT/INR No results for input(s): "LABPROT", "INR" in the last 72 hours.   Studies/Results: No results found.  Impression/Plan: -Food impaction -History of radiation-induced upper esophageal  stricture -Breast cancer s/p radiation  Recommendations ------------------------- -Plan for EGD with food bolus removal today -Patient was advised again not to eat solid food or any meat products because of her esophageal stricture resulting in frequent food impaction.  She needs to stay on pured diet.  Risks (bleeding, infection, bowel perforation that could require surgery, sedation-related changes in cardiopulmonary systems), benefits (identification and possible treatment of source of symptoms, exclusion of certain causes of symptoms), and alternatives (watchful waiting, radiographic imaging studies, empiric medical treatment)  were explained to patient/family in detail and patient wishes to proceed.    LOS: 0 days   Otis Brace  MD, FACP 06/14/2022, 10:06 PM  Contact #  [phone]

## 2022-06-14 NOTE — Anesthesia Procedure Notes (Signed)
Procedure Name: Intubation Date/Time: 06/14/2022 10:26 PM  Performed by: Gean Maidens, CRNAPre-anesthesia Checklist: Patient identified, Emergency Drugs available, Suction available, Patient being monitored and Timeout performed Patient Re-evaluated:Patient Re-evaluated prior to induction Oxygen Delivery Method: Circle system utilized Preoxygenation: Pre-oxygenation with 100% oxygen Induction Type: IV induction and Rapid sequence Laryngoscope Size: Mac and 4 Grade View: Grade I Tube type: Oral Tube size: 7.0 mm Number of attempts: 1 Airway Equipment and Method: Stylet Placement Confirmation: ETT inserted through vocal cords under direct vision, positive ETCO2 and breath sounds checked- equal and bilateral Secured at: 21 cm Tube secured with: Tape Dental Injury: Teeth and Oropharynx as per pre-operative assessment

## 2022-06-14 NOTE — Op Note (Signed)
[facility] Patient Name: [name] Procedure Date: 06/14/2022 MRN: [id number] Attending MD: Otis Brace , MD Date of Birth: [birth year] CSN: [id number] Age: 51 Admit Type: Outpatient Procedure:                Upper GI endoscopy Indications:              Dysphagia, Foreign body in the esophagus Providers:                Otis Brace, MD, Jeanella Cara, RN,                            Darliss Cheney, Technician Referring MD:              Medicines:                Sedation Administered by an Anesthesia Professional Complications:            No immediate complications. Estimated Blood Loss:     Estimated blood loss was minimal. Procedure:                Pre-Anesthesia Assessment:                           - Prior to the procedure, a History and Physical                            was performed, and patient medications and                            allergies were reviewed. The patient's tolerance of                            previous anesthesia was also reviewed. The risks                            and benefits of the procedure and the sedation                            options and risks were discussed with the patient.                            All questions were answered, and informed consent                            was obtained. Prior Anticoagulants: The patient has                            taken no previous anticoagulant or antiplatelet                            agents. ASA Grade Assessment: III - A patient with                            severe systemic disease. After reviewing the risks  and benefits, the patient was deemed in                            satisfactory condition to undergo the procedure.                           After obtaining informed consent, the endoscope was                            passed under direct vision. Throughout the                            procedure, the patient's blood pressure,  pulse, and                            oxygen saturations were monitored continuously. The                            [id number]-H190 ([id number] Olympus endoscope was introduced                            through the mouth, and advanced to the second part                            of duodenum. The upper GI endoscopy was performed                            with difficulty due to presence of food. The                            patient tolerated the procedure well. Scope In: Scope Out: Findings:      Food was found in the upper third of the esophagus. Removal of food was       accomplished by gently pushing food bolus in to the stomach.      One benign-appearing, intrinsic moderate (circumferential scarring or       stenosis; an endoscope may pass) stenosis was found in the upper third       of the esophagus. The stenosis was traversed.      LA Grade A (one or more mucosal breaks less than 5 mm, not extending       between tops of 2 mucosal folds) esophagitis with no bleeding was found       at the gastroesophageal junction.      Scattered mild inflammation characterized by congestion (edema) and       erythema was found in the gastric antrum and in the prepyloric region of       the stomach.      The cardia and gastric fundus were normal on retroflexion.      The duodenal bulb, first portion of the duodenum and second portion of       the duodenum were normal. Impression:               - Food in the upper third of the esophagus. Removal  was successful.                           - Benign-appearing esophageal stenosis.                           - LA Grade A reflux esophagitis with no bleeding.                           - Gastritis.                           - Normal duodenal bulb, first portion of the                            duodenum and second portion of the duodenum. Moderate Sedation:      Moderate (conscious) sedation was personally administered by an        anesthesia professional. The following parameters were monitored: oxygen       saturation, heart rate, blood pressure, and response to care. Recommendation:           - Patient has a contact number available for                            emergencies. The signs and symptoms of potential                            delayed complications were discussed with the                            patient. Return to normal activities tomorrow.                            Written discharge instructions were provided to the                            patient.                           - Pureed diet.                           - Continue present medications. Procedure Code(s):        --- Professional ---                           [phone], Esophagogastroduodenoscopy, flexible,                            transoral; with removal of foreign body(s) Diagnosis Code(s):        --- Professional ---                           Q65.784O, Food in esophagus causing other injury,                            initial encounter  K22.2, Esophageal obstruction                           K21.00, Gastro-esophageal reflux disease with                            esophagitis, without bleeding                           K29.70, Gastritis, unspecified, without bleeding                           R13.10, Dysphagia, unspecified                           T18.108A, Unspecified foreign body in esophagus                            causing other injury, initial encounter CPT copyright 2019 [employer]. All rights reserved. The codes documented in this report are preliminary and upon coder review may  be revised to meet current compliance requirements. Otis Brace, MD Otis Brace, MD 06/14/2022 10:34:07 PM Number of Addenda: 0

## 2022-06-14 NOTE — Transfer of Care (Signed)
Immediate Anesthesia Transfer of Care Note  Patient: [name]  Procedure(s) Performed: ESOPHAGOGASTRODUODENOSCOPY (EGD) WITH PROPOFOL  Patient Location: PACU  Anesthesia Type:General  Level of Consciousness: awake, alert  and oriented  Airway & Oxygen Therapy: Patient Spontanous Breathing and Patient connected to face mask oxygen  Post-op Assessment: Report given to RN and Post -op Vital signs reviewed and stable  Post vital signs: Reviewed and stable  Last Vitals:  Vitals Value Taken Time  BP    Temp    Pulse 115 06/14/22 2239  Resp 24 06/14/22 2239  SpO2 95 % 06/14/22 2239  Vitals shown include unvalidated device data.  Last Pain:  Vitals:   06/14/22 2201  TempSrc: Temporal  PainSc: 8          Complications: No notable events documented.

## 2022-06-14 NOTE — Anesthesia Preprocedure Evaluation (Signed)
Anesthesia Evaluation  Patient identified by MRN, date of birth, ID band Patient awake    Reviewed: Allergy & Precautions, NPO status , Patient's Chart, lab work & pertinent test results  Airway Mallampati: II  TM Distance: >3 FB Neck ROM: Full    Dental no notable dental hx.    Pulmonary asthma , COPD, Current Smoker,    Pulmonary exam normal breath sounds clear to auscultation       Cardiovascular negative cardio ROS Normal cardiovascular exam Rhythm:Regular Rate:Normal     Neuro/Psych negative neurological ROS  negative psych ROS   GI/Hepatic negative GI ROS, Neg liver ROS,   Endo/Other  negative endocrine ROS  Renal/GU negative Renal ROS  negative genitourinary   Musculoskeletal negative musculoskeletal ROS (+)   Abdominal   Peds negative pediatric ROS (+)  Hematology negative hematology ROS (+)   Anesthesia Other Findings   Reproductive/Obstetrics negative OB ROS                             Anesthesia Physical Anesthesia Plan  ASA: 2 and emergent  Anesthesia Plan: General   Post-op Pain Management: Minimal or no pain anticipated   Induction: Intravenous and Rapid sequence  PONV Risk Score and Plan: 2 and Ondansetron and Treatment may vary due to age or medical condition  Airway Management Planned: Oral ETT  Additional Equipment:   Intra-op Plan:   Post-operative Plan: Extubation in OR  Informed Consent: I have reviewed the patients History and Physical, chart, labs and discussed the procedure including the risks, benefits and alternatives for the proposed anesthesia with the patient or authorized representative who has indicated his/her understanding and acceptance.     Dental advisory given  Plan Discussed with: CRNA and Surgeon  Anesthesia Plan Comments:         Anesthesia Quick Evaluation

## 2022-06-14 NOTE — ED Provider Notes (Signed)
[facility] DEPT Provider Note   CSN: [id number] Arrival date & time: 06/14/22  1840     History  Chief Complaint  Patient presents with   Swallowed Foreign Body    [name] is a 51 y.o. female.  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 Reports eating cooked broccoli with cheese tonight at 5pm and has been unable to get the bolus to pass, unable to tolerate secretions. Tonight's episode is similar to prior without new or different symptoms.        Home Medications Prior to Admission medications   Medication Sig Start Date End Date Taking? Authorizing Provider  albuterol (VENTOLIN HFA) 108 (90 Base) MCG/ACT inhaler Inhale 2 puffs into the lungs every 6 (six) hours as needed for wheezing or shortness of breath. 02/08/22   Mercy Riding, MD  apixaban (ELIQUIS) 5 MG TABS tablet Take 1 tablet (5 mg total) by mouth 2 (two) times daily. Patient not taking: Reported on 04/04/2022 03/18/22   Aline August, MD  diphenhydramine-acetaminophen (TYLENOL PM) 25-500 MG TABS tablet Take 2 tablets by mouth at bedtime as needed (for sleep).    [provider]  fluticasone (FLONASE) 50 MCG/ACT nasal spray Place 1 spray into both nostrils 2 (two) times daily as needed for allergies or rhinitis.    [provider]  folic acid (FOLVITE) 1 MG tablet Take 1 tablet (1 mg total) by mouth daily. Patient not taking: Reported on 03/03/2022 02/09/22   Mercy Riding, MD  HYDROcodone-acetaminophen (NORCO/VICODIN) 5-325 MG tablet Take 1 tablet by mouth every 6 (six) hours as needed for severe pain. 05/11/22   Tedd Sias, PA  Multiple Vitamin (MULTIVITAMIN) LIQD Take 15 mLs by mouth daily. Patient not taking: Reported on 03/03/2022 02/08/22   Mercy Riding, MD  nicotine (NICODERM CQ - DOSED IN MG/24 HOURS) 21 mg/24hr patch Place 1 patch (21 mg total) onto the skin daily. Patient not  taking: Reported on 04/04/2022 03/07/22   Oswald Hillock, MD  ondansetron (ZOFRAN-ODT) 8 MG disintegrating tablet Dissolve 1 tablet (8 mg total) by mouth every 8 (eight) hours as needed for nausea or vomiting. Patient not taking: Reported on 04/04/2022 02/08/22   Mercy Riding, MD  oxyCODONE (OXY IR/ROXICODONE) 5 MG immediate release tablet Take 1 tablet (5 mg total) by mouth every 6 (six) hours as needed for severe pain. Patient not taking: Reported on 04/04/2022 03/07/22   Oswald Hillock, MD  pantoprazole (PROTONIX) 40 MG tablet Take 1 tablet by mouth daily. 05/22/22 08/20/22  Otis Brace, MD  pantoprazole (PROTONIX) 40 MG tablet Take 1 tablet (40 mg total) by mouth daily. 05/22/22 05/22/23  Otis Brace, MD  potassium chloride (KLOR-CON) 20 MEQ packet Dilute the contents of 1 packet in 4 oz of water and drink by mouth 2  times daily for 5 days. Patient not taking: Reported on 04/28/2022 04/18/22 04/23/22  Pattricia Boss, MD  sucralfate (CARAFATE) 1 GM/10ML suspension Take 10 mLs (1 g total) by mouth 4 (four) times daily -  with meals and at bedtime. Patient not taking: Reported on 05/22/2022 03/07/22   Oswald Hillock, MD  tiotropium (SPIRIVA HANDIHALER) 18 MCG inhalation capsule Place 1 capsule (18 mcg total) into inhaler and inhale daily. Patient not taking: Reported on 03/03/2022 02/08/22 05/09/22  Mercy Riding, MD      Allergies    Compazine [prochlorperazine], Naproxen, and Bactrim [sulfamethoxazole-trimethoprim]  Review of Systems   Review of Systems Negative except as per HPI Physical Exam Updated Vital Signs BP 106/83   Pulse (!) 106   Temp 98.2 F (36.8 C) (Oral)   Resp 17   Ht 5\' 8"  (1.727 m)   Wt 58.5 kg   LMP 04/24/2018   SpO2 94%   BMI 19.61 kg/m  Physical Exam Vitals and nursing note reviewed.  Cardiovascular:     Rate and Rhythm: Normal rate and regular rhythm.     Heart sounds: Normal heart sounds.  Pulmonary:     Effort: Pulmonary effort is normal.     Breath sounds:  Normal breath sounds.  Abdominal:     Palpations: Abdomen is soft.     Tenderness: There is no abdominal tenderness.  Musculoskeletal:     Cervical back: Neck supple.  Skin:    General: Skin is warm and dry.     Findings: No erythema or rash.  Neurological:     Mental Status: She is oriented to person, place, and time.  Psychiatric:     Comments: Mildly anxious     ED Results / Procedures / Treatments   Labs (all labs ordered are listed, but only abnormal results are displayed) Labs Reviewed - No data to display  EKG None  Radiology No results found.  Procedures Procedures    Medications Ordered in ED Medications  glucagon (human recombinant) (GLUCAGEN) injection 1 mg (1 mg Intravenous Given 06/14/22 2004)    ED Course/ Medical Decision Making/ A&P                           Medical Decision Making  This patient presents to the ED for concern of impacted food bolus, this involves an extensive number of treatment options, and is a complaint that carries with it a high risk of complications and morbidity.  The differential diagnosis includes impacted food bolus   Co morbidities that complicate the patient evaluation  Recurrent impaction   Additional history obtained:  External records from outside source obtained and reviewed including prior consult note with Dr. Alessandra Bevels from 05/22/2022 for food impaction after eating chicken.  Notes more than 10 endoscopies for food impaction this year with 2 in June.   Consultations Obtained:  I requested consultation with the GI, Dr. Alessandra Bevels,  and discussed lab and imaging findings as well as pertinent plan - they recommend: glucagon, call back in 1 hour if no relief.  No improvement with glucagon and carbonated beverage, Dr. Alessandra Bevels aware.   Problem List / ED Course / Critical interventions / Medication management  51yo female with impacted food bolus after eating broccoli tonight. Not able to tolerate secretions.  Attempted glucagon and carbonated beverage without improvement. Dr. Alessandra Bevels to see. I ordered medication including glucagon for food bolus impaction Reevaluation of the patient after these medicines showed that the patient stayed the same I have reviewed the patients home medicines and have made adjustments as needed   Social Determinants of Health:  Lives with family   Test / Admission - Considered:  Plan for endoscopy for further treatment.         Final Clinical Impression(s) / ED Diagnoses Final diagnoses:  Esophageal obstruction due to food impaction    Rx / DC Orders ED Discharge Orders     None         Tacy Learn, PA-C 06/14/22 2124    Lennice Sites, DO 06/14/22 2156

## 2022-06-14 NOTE — ED Triage Notes (Signed)
Patient reports that she has broccoli stuck in her throat. Patient is talking, but states she can not swallow her own saliva or swallow any liquids.

## 2022-06-14 NOTE — Anesthesia Postprocedure Evaluation (Signed)
Anesthesia Post Note  Patient: [name]  Procedure(s) Performed: ESOPHAGOGASTRODUODENOSCOPY (EGD) WITH PROPOFOL     Patient location during evaluation: PACU Anesthesia Type: General Level of consciousness: awake and alert Pain management: pain level controlled Vital Signs Assessment: post-procedure vital signs reviewed and stable Respiratory status: spontaneous breathing, nonlabored ventilation, respiratory function stable and patient connected to nasal cannula oxygen Cardiovascular status: blood pressure returned to baseline and stable Postop Assessment: no apparent nausea or vomiting Anesthetic complications: no   No notable events documented.  Last Vitals:  Vitals:   06/14/22 2201 06/14/22 2239  BP: 117/83 (!) 128/112  Pulse: (!) 105 (!) 115  Resp: (!) 24 (!) 24  Temp: 36.7 C   SpO2: 96% 95%    Last Pain:  Vitals:   06/14/22 2201  TempSrc: Temporal  PainSc: 8                  Woodie Degraffenreid S

## 2022-06-17 ENCOUNTER — Encounter (HOSPITAL_COMMUNITY): Payer: Self-pay | Admitting: Gastroenterology

## 2022-07-01 ENCOUNTER — Inpatient Hospital Stay: Payer: Medicaid Other | Attending: Radiation Oncology

## 2022-07-01 ENCOUNTER — Ambulatory Visit (HOSPITAL_COMMUNITY)
Admission: RE | Admit: 2022-07-01 | Discharge: 2022-07-01 | Disposition: A | Discharge status: Home / Self Care | Payer: Medicaid Other | Source: Ambulatory Visit | Attending: Internal Medicine | Admitting: Internal Medicine

## 2022-07-01 ENCOUNTER — Other Ambulatory Visit: Payer: Self-pay

## 2022-07-01 DIAGNOSIS — Z881 Allergy status to other antibiotic agents status: Secondary | ICD-10-CM | POA: Insufficient documentation

## 2022-07-01 DIAGNOSIS — I3139 Other pericardial effusion (noninflammatory): Secondary | ICD-10-CM | POA: Insufficient documentation

## 2022-07-01 DIAGNOSIS — C3411 Malignant neoplasm of upper lobe, right bronchus or lung: Secondary | ICD-10-CM

## 2022-07-01 DIAGNOSIS — Z7901 Long term (current) use of anticoagulants: Secondary | ICD-10-CM | POA: Insufficient documentation

## 2022-07-01 DIAGNOSIS — R1310 Dysphagia, unspecified: Secondary | ICD-10-CM | POA: Insufficient documentation

## 2022-07-01 DIAGNOSIS — C3490 Malignant neoplasm of unspecified part of unspecified bronchus or lung: Secondary | ICD-10-CM | POA: Insufficient documentation

## 2022-07-01 DIAGNOSIS — Z886 Allergy status to analgesic agent status: Secondary | ICD-10-CM | POA: Insufficient documentation

## 2022-07-01 DIAGNOSIS — F17210 Nicotine dependence, cigarettes, uncomplicated: Secondary | ICD-10-CM | POA: Insufficient documentation

## 2022-07-01 DIAGNOSIS — Z923 Personal history of irradiation: Secondary | ICD-10-CM | POA: Insufficient documentation

## 2022-07-01 DIAGNOSIS — Z888 Allergy status to other drugs, medicaments and biological substances status: Secondary | ICD-10-CM | POA: Insufficient documentation

## 2022-07-01 DIAGNOSIS — M47814 Spondylosis without myelopathy or radiculopathy, thoracic region: Secondary | ICD-10-CM | POA: Insufficient documentation

## 2022-07-01 DIAGNOSIS — J432 Centrilobular emphysema: Secondary | ICD-10-CM | POA: Insufficient documentation

## 2022-07-01 DIAGNOSIS — G893 Neoplasm related pain (acute) (chronic): Secondary | ICD-10-CM

## 2022-07-01 DIAGNOSIS — D61818 Other pancytopenia: Secondary | ICD-10-CM | POA: Insufficient documentation

## 2022-07-01 DIAGNOSIS — R5383 Other fatigue: Secondary | ICD-10-CM | POA: Insufficient documentation

## 2022-07-01 DIAGNOSIS — Z9049 Acquired absence of other specified parts of digestive tract: Secondary | ICD-10-CM | POA: Insufficient documentation

## 2022-07-01 DIAGNOSIS — Z79899 Other long term (current) drug therapy: Secondary | ICD-10-CM | POA: Insufficient documentation

## 2022-07-01 LAB — CBC WITH DIFFERENTIAL (CANCER CENTER ONLY)
Abs Immature Granulocytes: 0.02 10*3/uL (ref 0.00–0.07)
Basophils Absolute: 0 10*3/uL (ref 0.0–0.1)
Basophils Relative: 0 %
Eosinophils Absolute: 0.1 10*3/uL (ref 0.0–0.5)
Eosinophils Relative: 2 %
HCT: 34.8 % — ABNORMAL LOW (ref 36.0–46.0)
Hemoglobin: 12 g/dL (ref 12.0–15.0)
Immature Granulocytes: 0 %
Lymphocytes Relative: 21 %
Lymphs Abs: 1.1 10*3/uL (ref 0.7–4.0)
MCH: 36.5 pg — ABNORMAL HIGH (ref 26.0–34.0)
MCHC: 34.5 g/dL (ref 30.0–36.0)
MCV: 105.8 fL — ABNORMAL HIGH (ref 80.0–100.0)
Monocytes Absolute: 0.4 10*3/uL (ref 0.1–1.0)
Monocytes Relative: 8 %
Neutro Abs: 3.6 10*3/uL (ref 1.7–7.7)
Neutrophils Relative %: 69 %
Platelet Count: 212 10*3/uL (ref 150–400)
RBC: 3.29 MIL/uL — ABNORMAL LOW (ref 3.87–5.11)
RDW: 13.2 % (ref 11.5–15.5)
WBC Count: 5.3 10*3/uL (ref 4.0–10.5)
nRBC: 0 % (ref 0.0–0.2)

## 2022-07-01 LAB — CMP (CANCER CENTER ONLY)
ALT: 13 U/L (ref 0–44)
AST: 15 U/L (ref 15–41)
Albumin: 4.2 g/dL (ref 3.5–5.0)
Alkaline Phosphatase: 59 U/L (ref 38–126)
Anion gap: 9 (ref 5–15)
BUN: 21 mg/dL — ABNORMAL HIGH (ref 6–20)
CO2: 19 mmol/L — ABNORMAL LOW (ref 22–32)
Calcium: 9.8 mg/dL (ref 8.9–10.3)
Chloride: 108 mmol/L (ref 98–111)
Creatinine: 1.36 mg/dL — ABNORMAL HIGH (ref 0.44–1.00)
GFR, Estimated: 47 mL/min — ABNORMAL LOW (ref >60)
Glucose, Bld: 95 mg/dL (ref 70–99)
Potassium: 4.1 mmol/L (ref 3.5–5.1)
Sodium: 136 mmol/L (ref 135–145)
Total Bilirubin: 0.2 mg/dL — ABNORMAL LOW (ref 0.3–1.2)
Total Protein: 7.6 g/dL (ref 6.5–8.1)

## 2022-07-01 LAB — SAMPLE TO BLOOD BANK

## 2022-07-01 MED ORDER — IOHEXOL 300 MG/ML  SOLN
75.0000 mL | Freq: Once | INTRAMUSCULAR | Status: AC | PRN
  Administered 2022-07-01: 70 mL via INTRAVENOUS

## 2022-07-01 MED ORDER — SODIUM CHLORIDE (PF) 0.9 % IJ SOLN
INTRAMUSCULAR | Status: AC
  Filled 2022-07-01: qty 50

## 2022-07-04 ENCOUNTER — Inpatient Hospital Stay (HOSPITAL_BASED_OUTPATIENT_CLINIC_OR_DEPARTMENT_OTHER): Payer: Medicaid Other | Admitting: Internal Medicine

## 2022-07-04 ENCOUNTER — Other Ambulatory Visit: Payer: Self-pay

## 2022-07-04 VITALS — BP 117/88 | HR 106 | Temp 97.9°F | Resp 16 | Wt 134.0 lb

## 2022-07-04 DIAGNOSIS — F17210 Nicotine dependence, cigarettes, uncomplicated: Secondary | ICD-10-CM | POA: Diagnosis not present

## 2022-07-04 DIAGNOSIS — Z888 Allergy status to other drugs, medicaments and biological substances status: Secondary | ICD-10-CM | POA: Diagnosis not present

## 2022-07-04 DIAGNOSIS — Z886 Allergy status to analgesic agent status: Secondary | ICD-10-CM | POA: Diagnosis not present

## 2022-07-04 DIAGNOSIS — M47814 Spondylosis without myelopathy or radiculopathy, thoracic region: Secondary | ICD-10-CM | POA: Diagnosis not present

## 2022-07-04 DIAGNOSIS — Z9049 Acquired absence of other specified parts of digestive tract: Secondary | ICD-10-CM | POA: Diagnosis not present

## 2022-07-04 DIAGNOSIS — Z923 Personal history of irradiation: Secondary | ICD-10-CM | POA: Diagnosis not present

## 2022-07-04 DIAGNOSIS — J432 Centrilobular emphysema: Secondary | ICD-10-CM | POA: Diagnosis not present

## 2022-07-04 DIAGNOSIS — Z79899 Other long term (current) drug therapy: Secondary | ICD-10-CM | POA: Diagnosis not present

## 2022-07-04 DIAGNOSIS — C3490 Malignant neoplasm of unspecified part of unspecified bronchus or lung: Secondary | ICD-10-CM | POA: Diagnosis not present

## 2022-07-04 DIAGNOSIS — R5383 Other fatigue: Secondary | ICD-10-CM | POA: Diagnosis not present

## 2022-07-04 DIAGNOSIS — D61818 Other pancytopenia: Secondary | ICD-10-CM | POA: Diagnosis not present

## 2022-07-04 DIAGNOSIS — R1310 Dysphagia, unspecified: Secondary | ICD-10-CM | POA: Diagnosis not present

## 2022-07-04 DIAGNOSIS — Z881 Allergy status to other antibiotic agents status: Secondary | ICD-10-CM | POA: Diagnosis not present

## 2022-07-04 DIAGNOSIS — C3411 Malignant neoplasm of upper lobe, right bronchus or lung: Secondary | ICD-10-CM | POA: Diagnosis not present

## 2022-07-04 DIAGNOSIS — Z7901 Long term (current) use of anticoagulants: Secondary | ICD-10-CM | POA: Diagnosis not present

## 2022-07-04 DIAGNOSIS — I3139 Other pericardial effusion (noninflammatory): Secondary | ICD-10-CM | POA: Diagnosis not present

## 2022-07-04 NOTE — Addendum Note (Signed)
Addended by: Ardeen Garland on: 07/04/2022 02:04 PM   Modules accepted: Orders

## 2022-07-04 NOTE — Progress Notes (Signed)
[place] [phone]   [phone]  OFFICE PROGRESS NOTE  Sheila Reichmann, PA-C [place] [address]  DIAGNOSIS: Limited stage (T3, N2, M0) small cell lung cancer presented with right hilar/suprahilar mass with occlusion of the right upper lobe bronchus in addition to right paratracheal, right infra hilar and subcarinal lymphadenopathy as well as additional right upper lobe and right apical pulmonary nodules.  This was diagnosed in October 2022.  PRIOR THERAPY: Systemic chemotherapy with cisplatin 80 Mg/M2 on day 1 and etoposide 100 Mg/M2 on days 1, 2 and 3 every 3 weeks.  First dose October 02, 2021.  This will be concurrent with radiotherapy under the care of Dr. Sondra Come.  Status post 4 cycles.  Last cycle of chemotherapy was given on December 04, 2021 with almost complete response. She declined prophylactic cranial irradiation.  CURRENT THERAPY: Observation.  INTERVAL HISTORY: [name] 51 y.o. female returns to the clinic today for follow-up visit accompanied by her mother.  The patient is feeling fine today with no concerning complaints except for fatigue.  Unfortunately she continues around 1 pack of cigarette every day.  She has a fall at work and she had fractures of the lateral malleolus and base of the fifth metatarsal.  She denied having any current chest pain, shortness of breath, cough or hemoptysis.  She has no nausea, vomiting, diarrhea or constipation.  She has no headache or visual changes.  She denied having any recent weight loss or night sweats.  She is here today for evaluation with repeat CT scan of the chest for restaging of her disease.  MEDICAL HISTORY: Past Medical History:  Diagnosis Date   Ankle sprain    left   Anxiety    treated for panic attacks in the past.   Asthma    COPD (chronic obstructive pulmonary disease) ([place])    Depression    History of radiation therapy    Right lung-  09/26/21-11/13/21- Dr. Gery Pray   Irregular heart rate    Lung cancer [facility])    Renal disorder    kidney infections    ALLERGIES:  is allergic to compazine [prochlorperazine], naproxen, and bactrim [sulfamethoxazole-trimethoprim].  MEDICATIONS:  Current Outpatient Medications  Medication Sig Dispense Refill   albuterol (VENTOLIN HFA) 108 (90 Base) MCG/ACT inhaler Inhale 2 puffs into the lungs every 6 (six) hours as needed for wheezing or shortness of breath. 18 g 2   apixaban (ELIQUIS) 5 MG TABS tablet Take 1 tablet (5 mg total) by mouth 2 (two) times daily. (Patient not taking: Reported on 04/04/2022)     diphenhydramine-acetaminophen (TYLENOL PM) 25-500 MG TABS tablet Take 2 tablets by mouth at bedtime as needed (for sleep).     fluticasone (FLONASE) 50 MCG/ACT nasal spray Place 1 spray into both nostrils 2 (two) times daily as needed for allergies or rhinitis.     folic acid (FOLVITE) 1 MG tablet Take 1 tablet (1 mg total) by mouth daily. (Patient not taking: Reported on 03/03/2022) 90 tablet 1   HYDROcodone-acetaminophen (NORCO/VICODIN) 5-325 MG tablet Take 1 tablet by mouth every 6 (six) hours as needed for severe pain. 6 tablet 0   Multiple Vitamin (MULTIVITAMIN) LIQD Take 15 mLs by mouth daily. (Patient not taking: Reported on 03/03/2022) 178 mL 1   nicotine (NICODERM CQ - DOSED IN MG/24 HOURS) 21 mg/24hr patch Place 1 patch (21 mg total) onto the skin daily. (Patient not taking:  Reported on 04/04/2022) 28 patch 0   ondansetron (ZOFRAN-ODT) 8 MG disintegrating tablet Dissolve 1 tablet (8 mg total) by mouth every 8 (eight) hours as needed for nausea or vomiting. (Patient not taking: Reported on 04/04/2022) 30 tablet 0   oxyCODONE (OXY IR/ROXICODONE) 5 MG immediate release tablet Take 1 tablet (5 mg total) by mouth every 6 (six) hours as needed for severe pain. (Patient not taking: Reported on 04/04/2022) 20 tablet 0   pantoprazole (PROTONIX) 40 MG tablet Take 1 tablet by mouth daily. 90 tablet  0   pantoprazole (PROTONIX) 40 MG tablet Take 1 tablet (40 mg total) by mouth daily. 90 tablet 3   potassium chloride (KLOR-CON) 20 MEQ packet Dilute the contents of 1 packet in 4 oz of water and drink by mouth 2  times daily for 5 days. (Patient not taking: Reported on 04/28/2022) 10 packet 0   sucralfate (CARAFATE) 1 GM/10ML suspension Take 10 mLs (1 g total) by mouth 4 (four) times daily -  with meals and at bedtime. (Patient not taking: Reported on 05/22/2022) 420 mL 0   tiotropium (SPIRIVA HANDIHALER) 18 MCG inhalation capsule Place 1 capsule (18 mcg total) into inhaler and inhale daily. (Patient not taking: Reported on 03/03/2022) 30 capsule 2   No current facility-administered medications for this visit.    SURGICAL HISTORY:  Past Surgical History:  Procedure Laterality Date   APPENDECTOMY     BALLOON DILATION N/A 03/15/2022   Procedure: BALLOON DILATION;  Surgeon: Ronnette Juniper, MD;  Location: [facility] ENDOSCOPY;  Service: Gastroenterology;  Laterality: N/A;   BRONCHIAL BIOPSY  09/21/2021   Procedure: BRONCHIAL BIOPSIES;  Surgeon: Collene Gobble, MD;  Location: [facility] ENDOSCOPY;  Service: Pulmonary;;   BRONCHIAL BRUSHINGS  09/21/2021   Procedure: BRONCHIAL BRUSHINGS;  Surgeon: Collene Gobble, MD;  Location: [facility] ENDOSCOPY;  Service: Pulmonary;;   BRONCHIAL NEEDLE ASPIRATION BIOPSY  09/21/2021   Procedure: BRONCHIAL NEEDLE ASPIRATION BIOPSIES;  Surgeon: Collene Gobble, MD;  Location: [facility] ENDOSCOPY;  Service: Pulmonary;;   ESOPHAGEAL STENT PLACEMENT N/A 03/06/2022   Procedure: ESOPHAGEAL STENT PLACEMENT;  Surgeon: Clarene Essex, MD;  Location: [facility] ENDOSCOPY;  Service: Gastroenterology;  Laterality: N/A;   ESOPHAGOGASTRODUODENOSCOPY N/A 12/26/2021   Procedure: ESOPHAGOGASTRODUODENOSCOPY (EGD);  Surgeon: Clarene Essex, MD;  Location: [facility];  Service: Endoscopy;  Laterality: N/A;   ESOPHAGOGASTRODUODENOSCOPY N/A 02/11/2022   Procedure: ESOPHAGOGASTRODUODENOSCOPY (EGD);  Surgeon: Arta Silence, MD;   Location: [facility]OSCOPY;  Service: Gastroenterology;  Laterality: N/A;   ESOPHAGOGASTRODUODENOSCOPY N/A 03/03/2022   Procedure: ESOPHAGOGASTRODUODENOSCOPY (EGD);  Surgeon: Wilford Corner, MD;  Location: [facility]OSCOPY;  Service: Gastroenterology;  Laterality: N/A;   ESOPHAGOGASTRODUODENOSCOPY N/A 03/06/2022   Procedure: ESOPHAGOGASTRODUODENOSCOPY (EGD);  Surgeon: Clarene Essex, MD;  Location: [facility]OSCOPY;  Service: Gastroenterology;  Laterality: N/A;  With esophageal stent placement   ESOPHAGOGASTRODUODENOSCOPY N/A 03/15/2022   Procedure: ESOPHAGOGASTRODUODENOSCOPY (EGD);  Surgeon: Ronnette Juniper, MD;  Location: [facility];  Service: Gastroenterology;  Laterality: N/A;  with dilation   ESOPHAGOGASTRODUODENOSCOPY N/A 04/18/2022   Procedure: ESOPHAGOGASTRODUODENOSCOPY (EGD);  Surgeon: Clarene Essex, MD;  Location: [facility]OSCOPY;  Service: Gastroenterology;  Laterality: N/A;   ESOPHAGOGASTRODUODENOSCOPY (EGD) WITH PROPOFOL N/A 01/07/2022   Procedure: ESOPHAGOGASTRODUODENOSCOPY (EGD) WITH PROPOFOL;  Surgeon: Otis Brace, MD;  Location: [facility];  Service: Gastroenterology;  Laterality: N/A;   ESOPHAGOGASTRODUODENOSCOPY (EGD) WITH PROPOFOL N/A 02/02/2022   Procedure: ESOPHAGOGASTRODUODENOSCOPY (EGD) WITH PROPOFOL;  Surgeon: Ronnette Juniper, MD;  Location: [facility];  Service: Gastroenterology;  Laterality: N/A;   ESOPHAGOGASTRODUODENOSCOPY (EGD) WITH PROPOFOL N/A 02/04/2022  Procedure: ESOPHAGOGASTRODUODENOSCOPY (EGD) WITH PROPOFOL;  Surgeon: Otis Brace, MD;  Location: [facility];  Service: Gastroenterology;  Laterality: N/A;   ESOPHAGOGASTRODUODENOSCOPY (EGD) WITH PROPOFOL N/A 03/13/2022   Procedure: ESOPHAGOGASTRODUODENOSCOPY (EGD) WITH PROPOFOL;  Surgeon: Clarene Essex, MD;  Location: [facility];  Service: Gastroenterology;  Laterality: N/A;   ESOPHAGOGASTRODUODENOSCOPY (EGD) WITH PROPOFOL N/A 04/25/2022   Procedure: ESOPHAGOGASTRODUODENOSCOPY (EGD) WITH PROPOFOL;  Surgeon: Otis Brace, MD;   Location: [facility];  Service: Gastroenterology;  Laterality: N/A;   ESOPHAGOGASTRODUODENOSCOPY (EGD) WITH PROPOFOL N/A 04/28/2022   Procedure: ESOPHAGOGASTRODUODENOSCOPY (EGD) WITH PROPOFOL;  Surgeon: Ronnette Juniper, MD;  Location: [facility];  Service: Gastroenterology;  Laterality: N/A;   ESOPHAGOGASTRODUODENOSCOPY (EGD) WITH PROPOFOL N/A 05/22/2022   Procedure: ESOPHAGOGASTRODUODENOSCOPY (EGD) WITH PROPOFOL;  Surgeon: Otis Brace, MD;  Location: [facility];  Service: Gastroenterology;  Laterality: N/A;   ESOPHAGOGASTRODUODENOSCOPY (EGD) WITH PROPOFOL N/A 06/14/2022   Procedure: ESOPHAGOGASTRODUODENOSCOPY (EGD) WITH PROPOFOL;  Surgeon: Otis Brace, MD;  Location: [facility];  Service: Gastroenterology;  Laterality: N/A;   ESOPHAGOSCOPY N/A 04/20/2022   Procedure: ESOPHAGOSCOPY;  Surgeon: Clarene Essex, MD;  Location: [facility];  Service: Gastroenterology;  Laterality: N/A;   FOREIGN BODY REMOVAL  12/26/2021   Procedure: FOREIGN BODY REMOVAL;  Surgeon: Clarene Essex, MD;  Location: [facility] END[facility];  Service: Endoscopy;;   FOREIGN BODY REMOVAL  02/02/2022   Procedure: FOREIGN BODY REMOVAL;  Surgeon: Ronnette Juniper, MD;  Location: [facility];  Service: Gastroenterology;;   FOREIGN BODY REMOVAL  02/11/2022   Procedure: FOREIGN BODY REMOVAL;  Surgeon: Arta Silence, MD;  Location: [facility];  Service: Gastroenterology;;   FOREIGN BODY REMOVAL N/A 03/03/2022   Procedure: FOREIGN BODY REMOVAL;  Surgeon: Wilford Corner, MD;  Location: [facility];  Service: Gastroenterology;  Laterality: N/A;   FOREIGN BODY REMOVAL  04/18/2022   Procedure: FOREIGN BODY REMOVAL;  Surgeon: Clarene Essex, MD;  Location: [facility];  Service: Gastroenterology;;   FOREIGN BODY REMOVAL  04/20/2022   Procedure: FOREIGN BODY REMOVAL;  Surgeon: Clarene Essex, MD;  Location: [facility];  Service: Gastroenterology;;  Food Impaction   FOREIGN BODY REMOVAL  04/25/2022   Procedure: FOREIGN BODY REMOVAL;  Surgeon:  Otis Brace, MD;  Location: [facility];  Service: Gastroenterology;;  Food Impaction   FOREIGN BODY REMOVAL  04/28/2022   Procedure: FOREIGN BODY REMOVAL;  Surgeon: Ronnette Juniper, MD;  Location: [facility];  Service: Gastroenterology;;   FOREIGN BODY REMOVAL  05/22/2022   Procedure: FOREIGN BODY REMOVAL;  Surgeon: Otis Brace, MD;  Location: [facility];  Service: Gastroenterology;;   FOREIGN BODY REMOVAL  06/14/2022   Procedure: FOREIGN BODY REMOVAL;  Surgeon: Otis Brace, MD;  Location: [facility];  Service: Gastroenterology;;   IR CHEST FLUORO  12/27/2021   LUMBAR LAMINECTOMY N/A 05/13/2018   Procedure: LEFT LUMBAR FOUR-FIVE MICRODISCECTOMY;  Surgeon: Marybelle Killings, MD;  Location: [place];  Service: Orthopedics;  Laterality: N/A;   STENT REMOVAL  03/13/2022   Procedure: STENT REMOVAL;  Surgeon: Clarene Essex, MD;  Location: [facility];  Service: Gastroenterology;;   TUBAL LIGATION     TUBAL LIGATION  1998   VIDEO BRONCHOSCOPY WITH ENDOBRONCHIAL ULTRASOUND N/A 09/21/2021   Procedure: VIDEO BRONCHOSCOPY WITH ENDOBRONCHIAL ULTRASOUND;  Surgeon: Collene Gobble, MD;  Location: [place] ENDOSCOPY;  Service: Pulmonary;  Laterality: N/A;    REVIEW OF SYSTEMS:  Constitutional: positive for fatigue Eyes: negative Ears, nose, mouth, throat, and face: negative Respiratory: negative Cardiovascular: negative Gastrointestinal: negative Genitourinary:negative Integument/breast: negative Hematologic/lymphatic: negative Musculoskeletal:negative Neurological: negative Behavioral/Psych: negative Endocrine: negative Allergic/Immunologic: negative  PHYSICAL EXAMINATION: General appearance: alert, cooperative, fatigued, and no distress Head: Normocephalic, without obvious abnormality, atraumatic Neck: no adenopathy, no JVD, supple, symmetrical, trachea midline, and thyroid not enlarged, symmetric, no tenderness/mass/nodules Lymph nodes: Cervical, supraclavicular, and axillary nodes  normal. Resp: clear to auscultation bilaterally Back: symmetric, no curvature. ROM normal. No CVA tenderness. Cardio: regular rate and rhythm, S1, S2 normal, no murmur, click, rub or gallop and Tachycardia GI: soft, non-tender; bowel sounds normal; no masses,  no organomegaly Extremities: extremities normal, atraumatic, no cyanosis or edema Neurologic: Alert and oriented X 3, normal strength and tone. Normal symmetric reflexes. Normal coordination and gait  ECOG PERFORMANCE STATUS: 1 - Symptomatic but completely ambulatory  Blood pressure 117/88, pulse (!) 106, temperature 97.9 F (36.6 C), temperature source Oral, resp. rate 16, weight 134 lb (60.8 kg), last menstrual period 04/24/2018, SpO2 98 %.  LABORATORY DATA: Lab Results  Component Value Date   WBC 5.3 07/01/2022   HGB 12.0 07/01/2022   HCT 34.8 (L) 07/01/2022   MCV 105.8 (H) 07/01/2022   PLT 212 07/01/2022      Chemistry      Component Value Date/Time   NA 136 07/01/2022 1244   NA 139 02/18/2017 0932   K 4.1 07/01/2022 1244   CL 108 07/01/2022 1244   CO2 19 (L) 07/01/2022 1244   BUN 21 (H) 07/01/2022 1244   BUN 11 02/18/2017 0932   CREATININE 1.36 (H) 07/01/2022 1244      Component Value Date/Time   CALCIUM 9.8 07/01/2022 1244   ALKPHOS 59 07/01/2022 1244   AST 15 07/01/2022 1244   ALT 13 07/01/2022 1244   BILITOT 0.2 (L) 07/01/2022 1244       RADIOGRAPHIC STUDIES: CT Chest W Contrast  Result Date: 07/02/2022 CLINICAL DATA:  Primary Cancer Type: Lung Imaging Indication: Routine surveillance Interval therapy since last imaging? No Initial Cancer Diagnosis Date: 09/21/2021; Established by: Biopsy-proven Detailed Pathology: Limited stage small cell lung cancer. Primary Tumor location: Right upper lobe. Surgeries: History of multiple EGD, most recently 06/14/2022. Appendectomy. Chemotherapy: Yes; Ongoing? No; Most recent administration: 12/04/2021 Immunotherapy? No Radiation therapy? Yes; Date Range: 09/26/2021 -  11/13/2021; Target: Right lung * Tracking Code: BO * EXAM: CT CHEST WITH CONTRAST TECHNIQUE: Multidetector CT imaging of the chest was performed during intravenous contrast administration. RADIATION DOSE REDUCTION: This exam was performed according to the departmental dose-optimization program which includes automated exposure control, adjustment of the mA and/or kV according to patient size and/or use of iterative reconstruction technique. CONTRAST:  78mL OMNIPAQUE IOHEXOL 300 MG/ML  SOLN COMPARISON:  Most recent CT chest 03/25/2022.  10/12/2021 PET-CT. FINDINGS: Cardiovascular: Normal heart size. Stable small pericardial effusion. Atherosclerotic nonaneurysmal thoracic aorta. Normal caliber pulmonary arteries. No central pulmonary emboli. Mediastinum/Nodes: No discrete thyroid nodules. Unremarkable esophagus. No axillary adenopathy. New enlarged 1.4 cm short axis diameter right supraclavicular lymph node with mass-effect on the posterior wall of the right internal jugular vein (series 2/image 10), obscured by streak artifact from surrounding dense IV contrast. No pathologically enlarged mediastinal lymph nodes. Mildly enlarged 1.0 cm right hilar node (series 2/image 66), previously 1.0 cm, stable. No left hilar adenopathy. Lungs/Pleura: No pneumothorax. No pleural effusion. Severe centrilobular emphysema with mild diffuse bronchial wall thickening. No acute consolidative airspace disease. Irregular solid central right upper lobe 1.4 x 0.9 cm pulmonary nodule (series 5/image 51), previously 1.4 x 1.0 cm using similar measurement technique, not appreciably changed. Tiny 0.4 cm posterior left upper lobe solid nodule (series 5/image 39) is stable.  No new significant pulmonary nodules. Upper abdomen: No acute abnormality. Musculoskeletal: No aggressive appearing focal osseous lesions. Mild thoracic spondylosis. IMPRESSION: 1. Suspected new enlarged 1.4 cm short axis diameter right supraclavicular lymph node, obscured  by streak artifact from surrounding dense IV contrast on this scan, suspicious for new nodal metastasis. Suggest PET-CT restaging. 2. Irregular solid central right upper lobe 1.4 cm pulmonary nodule, stable. 3. Stable mild right hilar adenopathy. 4. Stable small pericardial effusion. 5. Aortic Atherosclerosis (ICD10-I70.0) and Emphysema (ICD10-J43.9). Electronically Signed   By: Ilona Sorrel M.D.   On: 07/02/2022 10:53    ASSESSMENT AND PLAN: This is a very pleasant 51 years old white female recently diagnosed with limited stage (T3, N2, MX) small cell lung cancer presented with right hilar/suprahilar mass with occlusion of the right upper lobe bronchus in addition to right paratracheal, right infrahilar and subcarinal lymphadenopathy as well as right upper lobe and right apical pulmonary nodules diagnosed in October 2022. The patient had MRI of the brain performed recently that showed no evidence of metastatic disease to the brain.   The patient systemic chemotherapy with cisplatin 80 Mg/M2 on day 1 and 2 etoposide 100 Mg/M2 on days 1, 2 and 3 status post 4 cycles.  This is concurrent with radiotherapy.   She tolerated her treatment well except for the fatigue and pancytopenia as well as dysphagia from the radiotherapy. She declined prophylactic cranial irradiation. The patient is currently on observation and she is feeling fine with no concerning complaints. She had repeat CT scan of the chest performed recently.  I personally and independently reviewed the scan images and discussed the result and showed the images to the patient today. Unfortunately her scan showed suspicious new enlarged 1.4 cm right supraclavicular lymph node suspicious for disease recurrence. I recommended for the patient to have a PET scan for further evaluation of her disease and to rule out any other metastatic disease. If there is no other lesion except the enlarged right supraclavicular lymph node, I would refer the patient  back to Dr. Lisbeth Renshaw for consideration of palliative radiotherapy to this area. The patient will come back for follow-up visit in 2 weeks for evaluation and discussion of her PET scan results. For the smoking cessation I strongly encouraged the patient to quit smoking. She was advised to call immediately if she has any concerning symptoms in the interval. The patient voices understanding of current disease status and treatment options and is in agreement with the current care plan.  All questions were answered. The patient knows to call the clinic with any problems, questions or concerns. We can certainly see the patient much sooner if necessary.  Disclaimer: This note was dictated with voice recognition software. Similar sounding words can inadvertently be transcribed and may not be corrected upon review.

## 2022-07-15 ENCOUNTER — Ambulatory Visit (HOSPITAL_COMMUNITY): Payer: Medicaid Other

## 2022-07-17 ENCOUNTER — Telehealth: Payer: Self-pay | Admitting: Internal Medicine

## 2022-07-17 NOTE — Telephone Encounter (Signed)
R/s per 8/23 in basket, message has been left

## 2022-07-18 ENCOUNTER — Inpatient Hospital Stay: Payer: Medicaid Other | Admitting: Internal Medicine

## 2022-07-24 ENCOUNTER — Inpatient Hospital Stay: Payer: Medicaid Other | Admitting: Internal Medicine

## 2022-08-16 ENCOUNTER — Emergency Department (HOSPITAL_COMMUNITY): Payer: Medicaid Other

## 2022-08-16 ENCOUNTER — Encounter: Payer: Self-pay | Admitting: Internal Medicine

## 2022-08-16 ENCOUNTER — Emergency Department (HOSPITAL_COMMUNITY)
Admission: EM | Admit: 2022-08-16 | Discharge: 2022-08-16 | Disposition: A | Discharge status: Home / Self Care | Payer: Medicaid Other | Attending: Emergency Medicine | Admitting: Emergency Medicine

## 2022-08-16 ENCOUNTER — Other Ambulatory Visit: Payer: Self-pay

## 2022-08-16 ENCOUNTER — Encounter (HOSPITAL_COMMUNITY): Payer: Self-pay

## 2022-08-16 DIAGNOSIS — C787 Secondary malignant neoplasm of liver and intrahepatic bile duct: Secondary | ICD-10-CM

## 2022-08-16 DIAGNOSIS — C779 Secondary and unspecified malignant neoplasm of lymph node, unspecified: Secondary | ICD-10-CM

## 2022-08-16 DIAGNOSIS — J449 Chronic obstructive pulmonary disease, unspecified: Secondary | ICD-10-CM | POA: Insufficient documentation

## 2022-08-16 DIAGNOSIS — Z85118 Personal history of other malignant neoplasm of bronchus and lung: Secondary | ICD-10-CM | POA: Insufficient documentation

## 2022-08-16 DIAGNOSIS — R0789 Other chest pain: Secondary | ICD-10-CM

## 2022-08-16 LAB — BASIC METABOLIC PANEL
Anion gap: 11 (ref 5–15)
BUN: 15 mg/dL (ref 6–20)
CO2: 18 mmol/L — ABNORMAL LOW (ref 22–32)
Calcium: 10.7 mg/dL — ABNORMAL HIGH (ref 8.9–10.3)
Chloride: 107 mmol/L (ref 98–111)
Creatinine, Ser: 1.33 mg/dL — ABNORMAL HIGH (ref 0.44–1.00)
GFR, Estimated: 49 mL/min — ABNORMAL LOW (ref >60)
Glucose, Bld: 93 mg/dL (ref 70–99)
Potassium: 3.6 mmol/L (ref 3.5–5.1)
Sodium: 136 mmol/L (ref 135–145)

## 2022-08-16 LAB — CBC
HCT: 45.1 % (ref 36.0–46.0)
Hemoglobin: 15.1 g/dL — ABNORMAL HIGH (ref 12.0–15.0)
MCH: 36.2 pg — ABNORMAL HIGH (ref 26.0–34.0)
MCHC: 33.5 g/dL (ref 30.0–36.0)
MCV: 108.2 fL — ABNORMAL HIGH (ref 80.0–100.0)
Platelets: 262 10*3/uL (ref 150–400)
RBC: 4.17 MIL/uL (ref 3.87–5.11)
RDW: 13.2 % (ref 11.5–15.5)
WBC: 8.5 10*3/uL (ref 4.0–10.5)
nRBC: 0 % (ref 0.0–0.2)

## 2022-08-16 LAB — LACTIC ACID, PLASMA: Lactic Acid, Venous: 1 mmol/L (ref 0.5–1.9)

## 2022-08-16 LAB — HEPATIC FUNCTION PANEL
ALT: 22 U/L (ref 0–44)
AST: 30 U/L (ref 15–41)
Albumin: 4.7 g/dL (ref 3.5–5.0)
Alkaline Phosphatase: 132 U/L — ABNORMAL HIGH (ref 38–126)
Bilirubin, Direct: <0.1 mg/dL (ref 0.0–0.2)
Total Bilirubin: 0.4 mg/dL (ref 0.3–1.2)
Total Protein: 9.7 g/dL — ABNORMAL HIGH (ref 6.5–8.1)

## 2022-08-16 LAB — I-STAT BETA HCG BLOOD, ED (MC, WL, AP ONLY): I-stat hCG, quantitative: 11.9 m[IU]/mL — ABNORMAL HIGH (ref <5)

## 2022-08-16 LAB — TROPONIN I (HIGH SENSITIVITY): Troponin I (High Sensitivity): 2 ng/L (ref <18)

## 2022-08-16 LAB — LIPASE, BLOOD: Lipase: 36 U/L (ref 11–51)

## 2022-08-16 MED ORDER — OXYCODONE-ACETAMINOPHEN 5-325 MG PO TABS: 1.0000 | ORAL_TABLET | Freq: Four times a day (QID) | ORAL | 0 refills | Status: AC | PRN

## 2022-08-16 MED ORDER — IOHEXOL 350 MG/ML SOLN
100.0000 mL | Freq: Once | INTRAVENOUS | Status: AC | PRN
  Administered 2022-08-16: 75 mL via INTRAVENOUS

## 2022-08-16 MED ORDER — LACTATED RINGERS IV BOLUS
1000.0000 mL | Freq: Once | INTRAVENOUS | Status: AC
  Administered 2022-08-16: 1000 mL via INTRAVENOUS

## 2022-08-16 MED ORDER — MORPHINE SULFATE (PF) 4 MG/ML IV SOLN
4.0000 mg | Freq: Once | INTRAVENOUS | Status: AC
  Administered 2022-08-16: 4 mg via INTRAVENOUS
  Filled 2022-08-16: qty 1

## 2022-08-16 MED ORDER — SODIUM CHLORIDE (PF) 0.9 % IJ SOLN
INTRAMUSCULAR | Status: AC
  Filled 2022-08-16: qty 50

## 2022-08-16 MED ORDER — ONDANSETRON HCL 4 MG/2ML IJ SOLN
4.0000 mg | Freq: Once | INTRAMUSCULAR | Status: AC
  Administered 2022-08-16: 4 mg via INTRAVENOUS

## 2022-08-16 NOTE — ED Provider Notes (Signed)
[facility] DEPT Provider Note   CSN: [id number] Arrival date & time: 08/16/22  0626     History {Add pertinent medical, surgical, social history, OB history to HPI:1} Chief Complaint  Patient presents with   Chest Pain    [name] is a 51 y.o. 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  Presents with left sided "rib cage" pain x 1 week. Severe pain dull 8/10 constant. Radiates into L shoulder. +Nausea/vomiting, no hematemesis. +SOB and lightheadedness, decreased PO intake. Denies falls, syncope. No h/o similar pain. No dysuria/hematuria, no vaginal symptoms or diarrhea/constipation. +cough, maybe worse than normal. Denies hemoptysis, leg swelling. Pain is not pleuritic.   Last seen by oncology on 07/04/2022.  Per chart review, last cycle of chemotherapy was given on 12/04/2021 with almost complete response.  Unfortunately monitoring CT scan demonstrated a new enlarged 1.4 cm right supraclavicular lymph node suspicious for disease recurrence, oncology recommended PET scan and follow-up in 2 weeks.   Chest Pain      Home Medications Prior to Admission medications   Medication Sig Start Date End Date Taking? Authorizing Provider  albuterol (VENTOLIN HFA) 108 (90 Base) MCG/ACT inhaler Inhale 2 puffs into the lungs every 6 (six) hours as needed for wheezing or shortness of breath. 02/08/22   Mercy Riding, MD  diphenhydramine-acetaminophen (TYLENOL PM) 25-500 MG TABS tablet Take 2 tablets by mouth at bedtime as needed (for sleep).    [provider]      Allergies    Compazine [prochlorperazine], Naproxen, and Bactrim [sulfamethoxazole-trimethoprim]    Review of Systems   Review of  Systems  Cardiovascular:  Positive for chest pain.   Review of systems negative for f/c.  A 10 point review of systems was performed and is negative unless otherwise reported in HPI.  Physical Exam Updated Vital Signs BP 116/85   Pulse (!) 119   Temp (!) 97.4 F (36.3 C) (Oral)   Resp 10   Ht 5\' 8"  (1.727 m)   Wt 56.7 kg   LMP 04/24/2018   SpO2 94%   BMI 19.01 kg/m  Physical Exam General: Uncomfortable appearing female, lying in bed.  HEENT: PERRLA, Sclera anicteric, MMM, trachea midline. Cardiology: RRR, no murmurs/rubs/gallops. BL radial and DP pulses equal bilaterally.  Resp: Normal respiratory rate and effort. CTAB, no wheezes, rhonchi, crackles.  Abd: +Epigastric, LUQ tenderness to palpation. Soft, non-distended. No rebound tenderness or guarding.  GU: Deferred. MSK: No peripheral edema or signs of trauma. Extremities without deformity or TTP. No cyanosis or clubbing. Skin: warm, dry. No rashes or lesions. Back: No CVA tenderness Neuro: A&Ox4, CNs II-XII grossly intact. MAEs. Sensation grossly intact.  Psych: Tearful, anxious mood and affect.   ED Results / Procedures / Treatments   Labs (all labs ordered are listed, but only abnormal results are displayed) Labs Reviewed  BASIC METABOLIC PANEL - Abnormal; Notable for the following components:      Result Value   CO2 18 (*)    Creatinine, Ser 1.33 (*)    Calcium 10.7 (*)    GFR, Estimated 49 (*)    All other components within normal limits  CBC - Abnormal; Notable for the following components:   Hemoglobin 15.1 (*)    MCV 108.2 (*)    MCH 36.2 (*)  All other components within normal limits  I-STAT BETA HCG BLOOD, ED ([facility], [facility], [facility] ONLY) - Abnormal; Notable for the following components:   I-stat hCG, quantitative 11.9 (*)    All other components within normal limits  LIPASE, BLOOD  LACTIC ACID, PLASMA  LACTIC ACID, PLASMA  HEPATIC FUNCTION PANEL  TROPONIN I (HIGH SENSITIVITY)    EKG EKG  Interpretation  Date/Time:  Friday August 16 2022 08:35:41 EDT Ventricular Rate:  126 PR Interval:  121 QRS Duration: 95 QT Interval:  435 QTC Calculation: 630 R Axis:   86 Text Interpretation: Sinus tachycardia Prolonged QT interval Confirmed by Cindee Lame [phone]) on 08/16/2022 9:01:38 AM  Radiology DG Chest 2 View  Result Date: 08/16/2022 CLINICAL DATA:  LEFT-sided chest pain and shoulder pain radiating to back for 2 days. Shortness of breath and lightheadedness. EXAM: CHEST - 2 VIEW COMPARISON:  Chest x-ray dated 04/28/2022 FINDINGS: Heart size and mediastinal contours are within normal limits. Lungs are hyperexpanded. Coarse lung markings are seen bilaterally. No confluent opacity to suggest a developing pneumonia. No pleural effusion or pneumothorax is seen. Osseous structures about the chest are unremarkable. IMPRESSION: 1. No active cardiopulmonary disease. No evidence of pneumonia or pulmonary edema. 2. Hyperexpanded lungs indicating COPD. Coarse lung markings are seen bilaterally, compatible with chronic interstitial lung disease and/or emphysema. Electronically Signed   By: Franki Cabot M.D.   On: 08/16/2022 09:16    Procedures Procedures  {Document cardiac monitor, telemetry assessment procedure when appropriate:1}  Medications Ordered in ED Medications  lactated ringers bolus 1,000 mL (1,000 mLs Intravenous New Bag/Given 08/16/22 1009)  morphine (PF) 4 MG/ML injection 4 mg (4 mg Intravenous Given 08/16/22 1009)  ondansetron (ZOFRAN) injection 4 mg (4 mg Intravenous Given 08/16/22 1009)    ED Course/ Medical Decision Making/ A&P                          Medical Decision Making Amount and/or Complexity of Data Reviewed Labs: ordered. Radiology: ordered. Decision-making details documented in ED Course.  Risk Prescription drug management.   Patient tachycardic to 110s, normotensive, afebrile. Patient appears uncomfortable, no increased WOB.   DDX for chest pain and  dyspnea includes but is not limited to:  ACS/arrhythmia, PE, PNA, PUD/gastritis, cardiac tamponade, or musculoskeletal pain. Very low suspicion for aortic dissection given presenting sx. patient with active malignancy, chest pain, shortness of breath and tachycardia will evaluate with CT PE for PE. Patient has no history of pain in the side, but also consider patient's known malignancy or possible metastasis causing pain. patient has not yet had her PET scan.  Patient has no right upper quadrant tenderness but also has left upper quadrant tenderness and rib pain.  Consider GERD/gastritis.  Will obtain CT PE, labs.  Will give 1 L LR for tachycardia and likely dehydration given decreased p.o. intake nausea vomiting and check for electrolyte abnormalities or renal injury.  Will control pain and nausea.   I have personally reviewed and interpreted all labs and imaging.   Clinical Course as of 08/16/22 1019  Fri Aug 16, 2022  1002 DG Chest 2 View 1. No active cardiopulmonary disease. No evidence of pneumonia or pulmonary edema. 2. Hyperexpanded lungs indicating COPD. Coarse lung markings are seen bilaterally, compatible with chronic interstitial lung disease and/or emphysema. [HN]  1018 Troponin I (High Sensitivity): 2 [HN]  1018 Creatinine(!): 1.33 Similar to prior in 8/7 but increased from June [HN]  1019 Hemoglobin(!): 15.1  Hemoconcentrated [HN]  1019 WBC: 8.5 [HN]    Clinical Course User Index [HN] Audley Hose, MD    {Document critical care time when appropriate:1} {Document review of labs and clinical decision tools ie heart score, Chads2Vasc2 etc:1}  {Document your independent review of radiology images, and any outside records:1} {Document your discussion with family members, caretakers, and with consultants:1} {Document social determinants of health affecting pt's care:1} {Document your decision making why or why not admission, treatments were needed:1} Final Clinical  Impression(s) / ED Diagnoses Final diagnoses:  None    Rx / DC Orders ED Discharge Orders     None        This note was created using dictation software, which may contain spelling or grammatical errors.

## 2022-08-16 NOTE — Discharge Instructions (Addendum)
Thank you for coming to [facility] Emergency Department. You were seen for chest pain. We did an exam, labs, and imaging, and your CT chest read as follows: 1. No evidence of pulmonary embolism. 2. Significant interval enlargement of a right supraclavicular lymph node measuring 3.7 cm in diameter, previously measured 1.5 cm on 07/01/2022, concerning for metastatic disease. 3. Innumerable ill-defined areas of subtle hypoattenuation throughout the visualized liver is suspicious for metastatic disease in the setting of known malignancy. Dedicated CT of the abdomen and pelvis with contrast can be obtained further assess. Alternatively, a restaging PET-CT should be considered. 4. Subtle nodular foci of increased attenuation within the visualized proximal right humerus could potentially represent sites of osseous metastatic disease. 5. Severe centrilobular emphysema with stable 1.4 cm right upper lobe pulmonary nodule and additional scattered small bilateral pulmonary nodules. 6. Reflux of contrast into the IVC and hepatic veins, suggestive of right heart dysfunction.   It is recommended that you follow up with your oncologist about these and possibly receive a PET scan. Please call your oncologist today to make an appointment. Your pain could be caused by the metastases.  A few days of pain medication Percocet been prescribed. Please follow up with your primary care provider within 1 week.   Do not hesitate to return to the ED or call 911 if you experience: -Worsening symptoms -Lightheadedness, passing out -Fevers/chills -Anything else that concerns you

## 2022-08-16 NOTE — ED Triage Notes (Signed)
Patient c/o left chest and left shoulder pain that radiates into the back x 2 days. Patient also c/o SOB and lightheadedness. Patient states she has lung cancer.

## 2022-08-19 ENCOUNTER — Emergency Department (HOSPITAL_COMMUNITY): Admission: EM | Admit: 2022-08-19 | Discharge: 2022-08-19 | Discharge status: Against Medical Advice | Payer: Self-pay

## 2022-08-19 ENCOUNTER — Encounter: Payer: Self-pay | Admitting: Internal Medicine

## 2022-08-20 ENCOUNTER — Encounter: Payer: Self-pay | Admitting: Internal Medicine

## 2022-08-20 ENCOUNTER — Other Ambulatory Visit: Payer: Self-pay

## 2022-08-20 ENCOUNTER — Encounter (HOSPITAL_BASED_OUTPATIENT_CLINIC_OR_DEPARTMENT_OTHER): Payer: Self-pay

## 2022-08-20 ENCOUNTER — Emergency Department (HOSPITAL_BASED_OUTPATIENT_CLINIC_OR_DEPARTMENT_OTHER): Payer: Medicaid Other

## 2022-08-20 ENCOUNTER — Inpatient Hospital Stay (HOSPITAL_BASED_OUTPATIENT_CLINIC_OR_DEPARTMENT_OTHER)
Admission: EM | Admit: 2022-08-20 | Discharge: 2022-08-23 | DRG: 543 | Disposition: A | Discharge status: Hospice | Payer: Self-pay | Attending: Internal Medicine | Admitting: Internal Medicine

## 2022-08-20 DIAGNOSIS — Z515 Encounter for palliative care: Secondary | ICD-10-CM

## 2022-08-20 DIAGNOSIS — M5126 Other intervertebral disc displacement, lumbar region: Secondary | ICD-10-CM | POA: Diagnosis present

## 2022-08-20 DIAGNOSIS — M5459 Other low back pain: Secondary | ICD-10-CM | POA: Diagnosis present

## 2022-08-20 DIAGNOSIS — M549 Dorsalgia, unspecified: Secondary | ICD-10-CM | POA: Diagnosis present

## 2022-08-20 DIAGNOSIS — F17210 Nicotine dependence, cigarettes, uncomplicated: Secondary | ICD-10-CM | POA: Diagnosis present

## 2022-08-20 DIAGNOSIS — C7951 Secondary malignant neoplasm of bone: Secondary | ICD-10-CM | POA: Diagnosis present

## 2022-08-20 DIAGNOSIS — Z9049 Acquired absence of other specified parts of digestive tract: Secondary | ICD-10-CM

## 2022-08-20 DIAGNOSIS — Z8701 Personal history of pneumonia (recurrent): Secondary | ICD-10-CM

## 2022-08-20 DIAGNOSIS — G893 Neoplasm related pain (acute) (chronic): Secondary | ICD-10-CM | POA: Diagnosis present

## 2022-08-20 DIAGNOSIS — M8458XA Pathological fracture in neoplastic disease, other specified site, initial encounter for fracture: Principal | ICD-10-CM | POA: Diagnosis present

## 2022-08-20 DIAGNOSIS — F410 Panic disorder [episodic paroxysmal anxiety] without agoraphobia: Secondary | ICD-10-CM | POA: Diagnosis present

## 2022-08-20 DIAGNOSIS — M5441 Lumbago with sciatica, right side: Principal | ICD-10-CM | POA: Diagnosis present

## 2022-08-20 DIAGNOSIS — D638 Anemia in other chronic diseases classified elsewhere: Secondary | ICD-10-CM | POA: Diagnosis present

## 2022-08-20 DIAGNOSIS — Z9851 Tubal ligation status: Secondary | ICD-10-CM

## 2022-08-20 DIAGNOSIS — Z923 Personal history of irradiation: Secondary | ICD-10-CM

## 2022-08-20 DIAGNOSIS — S32049A Unspecified fracture of fourth lumbar vertebra, initial encounter for closed fracture: Secondary | ICD-10-CM | POA: Diagnosis present

## 2022-08-20 DIAGNOSIS — M5442 Lumbago with sciatica, left side: Secondary | ICD-10-CM | POA: Diagnosis present

## 2022-08-20 DIAGNOSIS — Z886 Allergy status to analgesic agent status: Secondary | ICD-10-CM

## 2022-08-20 DIAGNOSIS — F32A Depression, unspecified: Secondary | ICD-10-CM | POA: Diagnosis present

## 2022-08-20 DIAGNOSIS — Z66 Do not resuscitate: Secondary | ICD-10-CM | POA: Diagnosis not present

## 2022-08-20 DIAGNOSIS — M898X9 Other specified disorders of bone, unspecified site: Secondary | ICD-10-CM

## 2022-08-20 DIAGNOSIS — Z720 Tobacco use: Secondary | ICD-10-CM | POA: Diagnosis present

## 2022-08-20 DIAGNOSIS — C778 Secondary and unspecified malignant neoplasm of lymph nodes of multiple regions: Secondary | ICD-10-CM | POA: Diagnosis present

## 2022-08-20 DIAGNOSIS — E876 Hypokalemia: Secondary | ICD-10-CM | POA: Diagnosis present

## 2022-08-20 DIAGNOSIS — Z86718 Personal history of other venous thrombosis and embolism: Secondary | ICD-10-CM

## 2022-08-20 DIAGNOSIS — C787 Secondary malignant neoplasm of liver and intrahepatic bile duct: Secondary | ICD-10-CM | POA: Diagnosis present

## 2022-08-20 DIAGNOSIS — Z882 Allergy status to sulfonamides status: Secondary | ICD-10-CM

## 2022-08-20 DIAGNOSIS — Z888 Allergy status to other drugs, medicaments and biological substances status: Secondary | ICD-10-CM

## 2022-08-20 DIAGNOSIS — E538 Deficiency of other specified B group vitamins: Secondary | ICD-10-CM | POA: Diagnosis present

## 2022-08-20 DIAGNOSIS — J449 Chronic obstructive pulmonary disease, unspecified: Secondary | ICD-10-CM | POA: Diagnosis present

## 2022-08-20 DIAGNOSIS — Z9221 Personal history of antineoplastic chemotherapy: Secondary | ICD-10-CM

## 2022-08-20 DIAGNOSIS — C3411 Malignant neoplasm of upper lobe, right bronchus or lung: Secondary | ICD-10-CM | POA: Diagnosis present

## 2022-08-20 LAB — COMPREHENSIVE METABOLIC PANEL
ALT: 14 U/L (ref 0–44)
AST: 31 U/L (ref 15–41)
Albumin: 4.2 g/dL (ref 3.5–5.0)
Alkaline Phosphatase: 144 U/L — ABNORMAL HIGH (ref 38–126)
Anion gap: 11 (ref 5–15)
BUN: 13 mg/dL (ref 6–20)
CO2: 18 mmol/L — ABNORMAL LOW (ref 22–32)
Calcium: 10.1 mg/dL (ref 8.9–10.3)
Chloride: 107 mmol/L (ref 98–111)
Creatinine, Ser: 1.12 mg/dL — ABNORMAL HIGH (ref 0.44–1.00)
GFR, Estimated: 60 mL/min — ABNORMAL LOW (ref >60)
Glucose, Bld: 82 mg/dL (ref 70–99)
Potassium: 3.8 mmol/L (ref 3.5–5.1)
Sodium: 136 mmol/L (ref 135–145)
Total Bilirubin: 0.3 mg/dL (ref 0.3–1.2)
Total Protein: 8.1 g/dL (ref 6.5–8.1)

## 2022-08-20 LAB — CBC WITH DIFFERENTIAL/PLATELET
Abs Immature Granulocytes: 0.13 10*3/uL — ABNORMAL HIGH (ref 0.00–0.07)
Basophils Absolute: 0.1 10*3/uL (ref 0.0–0.1)
Basophils Relative: 1 %
Eosinophils Absolute: 0.1 10*3/uL (ref 0.0–0.5)
Eosinophils Relative: 1 %
HCT: 41.1 % (ref 36.0–46.0)
Hemoglobin: 13.6 g/dL (ref 12.0–15.0)
Immature Granulocytes: 2 %
Lymphocytes Relative: 11 %
Lymphs Abs: 1 10*3/uL (ref 0.7–4.0)
MCH: 35.7 pg — ABNORMAL HIGH (ref 26.0–34.0)
MCHC: 33.1 g/dL (ref 30.0–36.0)
MCV: 107.9 fL — ABNORMAL HIGH (ref 80.0–100.0)
Monocytes Absolute: 0.7 10*3/uL (ref 0.1–1.0)
Monocytes Relative: 7 %
Neutro Abs: 7 10*3/uL (ref 1.7–7.7)
Neutrophils Relative %: 78 %
Platelets: 214 10*3/uL (ref 150–400)
RBC: 3.81 MIL/uL — ABNORMAL LOW (ref 3.87–5.11)
RDW: 13.5 % (ref 11.5–15.5)
WBC: 8.9 10*3/uL (ref 4.0–10.5)
nRBC: 0 % (ref 0.0–0.2)

## 2022-08-20 LAB — MAGNESIUM: Magnesium: 1.6 mg/dL — ABNORMAL LOW (ref 1.7–2.4)

## 2022-08-20 MED ORDER — ONDANSETRON 4 MG PO TBDP
4.0000 mg | ORAL_TABLET | Freq: Once | ORAL | Status: AC
  Administered 2022-08-20: 4 mg via ORAL
  Filled 2022-08-20: qty 1

## 2022-08-20 MED ORDER — ACETAMINOPHEN 325 MG PO TABS
650.0000 mg | ORAL_TABLET | Freq: Four times a day (QID) | ORAL | Status: DC | PRN
  Administered 2022-08-22 (×2): 650 mg via ORAL
  Filled 2022-08-20 (×2): qty 2

## 2022-08-20 MED ORDER — LACTATED RINGERS IV BOLUS
1000.0000 mL | Freq: Once | INTRAVENOUS | Status: AC
  Administered 2022-08-20: 1000 mL via INTRAVENOUS

## 2022-08-20 MED ORDER — MAGNESIUM SULFATE 2 GM/50ML IV SOLN
2.0000 g | Freq: Once | INTRAVENOUS | Status: AC
  Administered 2022-08-20: 2 g via INTRAVENOUS
  Filled 2022-08-20: qty 50

## 2022-08-20 MED ORDER — ONDANSETRON HCL 4 MG/2ML IJ SOLN
4.0000 mg | Freq: Four times a day (QID) | INTRAMUSCULAR | Status: DC | PRN
  Administered 2022-08-21 (×2): 4 mg via INTRAVENOUS
  Filled 2022-08-20 (×2): qty 2

## 2022-08-20 MED ORDER — NALOXONE HCL 0.4 MG/ML IJ SOLN: 0.4000 mg | INTRAMUSCULAR | Status: DC | PRN

## 2022-08-20 MED ORDER — OXYCODONE-ACETAMINOPHEN 5-325 MG PO TABS
1.0000 | ORAL_TABLET | ORAL | Status: DC | PRN
  Filled 2022-08-20: qty 1

## 2022-08-20 MED ORDER — ACETAMINOPHEN 650 MG RE SUPP: 650.0000 mg | Freq: Four times a day (QID) | RECTAL | Status: DC | PRN

## 2022-08-20 MED ORDER — IOHEXOL 300 MG/ML  SOLN
100.0000 mL | Freq: Once | INTRAMUSCULAR | Status: AC | PRN
  Administered 2022-08-20: 85 mL via INTRAVENOUS

## 2022-08-20 MED ORDER — HYDROMORPHONE HCL 1 MG/ML IJ SOLN
0.5000 mg | Freq: Once | INTRAMUSCULAR | Status: AC
  Administered 2022-08-20: 0.5 mg via INTRAVENOUS
  Filled 2022-08-20: qty 1

## 2022-08-20 MED ORDER — HYDROMORPHONE HCL 1 MG/ML IJ SOLN
0.5000 mg | INTRAMUSCULAR | Status: DC | PRN
  Administered 2022-08-20 – 2022-08-21 (×7): 0.5 mg via INTRAVENOUS
  Filled 2022-08-20 (×7): qty 0.5

## 2022-08-20 NOTE — Treatment Plan (Signed)
Patient is a 51 year old female with history of limited stage small cell lung cancer with right hilar/suprahilar mass with occlusion of right upper bronchus, lymphadenopathy )diagnosed in October 2022), was on chemotherapy, following with Dr. Julien Nordmann , being considered for palliative radiation therapy, smoker who presented with back pain to [place] at [facility].  On presentation ,she was hemodynamically stable.  Lab work showed magnesium of 1.6.  CT chest/abdomen/pelvis showed lymphadenopathy, known right pulmonary nodule, hepatic metastasis.  CT T spine, L  spine showed stable right-sided inferior endplate defect of D7,OEUMPNT hepatic metastatic disease,new superior endplate fracture of L4 ,left paracentral disc protrusion at L2-3 possibly impinging on the left L3 nerve root in the lateral recess.  There was no report of urinary/bladder incontinence, low suspicion for cauda equina.  Patient requested to be admitted for intractable back pain, pain management.

## 2022-08-20 NOTE — ED Notes (Signed)
Patient transported to CT 

## 2022-08-20 NOTE — ED Triage Notes (Signed)
Pt presents POV, ambulatory, ca/o x4  Pt presents with mid back pain radiating to her lower back x1 week. Pt seen in ED Friday and advised her Lung cancer has metastasized.  Pain intensified yesterday.   Pt has not taking any pain medications today.

## 2022-08-20 NOTE — H&P (Incomplete)
History and Physical    PLEASE NOTE THAT [employer]AGON DICT[employer] SOFTWARE WAS USED IN THE CONSTRUCTION OF THIS NOTE.   [name] PRF:[id number] DOB: 09-13-1971 DOA: 08/20/2022  PCP: [name], PA-C *** Patient coming from: home ***  I have personally briefly reviewed patient's old medical records in [place]  Chief Complaint: ***  HPI: [name] is a 51 y.o. female with medical history significant for *** who is admitted to [facility] on 08/20/2022 with *** after presenting from home*** to [facility] ED complaining of ***.    ***    ***SOB: Denies any associated orthopnea, PND, or new onset peripheral edema. No recent chest pain, diaphoresis, palpitations, N/V, pre-syncope, or syncope. Not associated with any recent cough, wheezing, hemoptysis, new lower extremity erythema, or calf tenderness. Denies any recent trauma, travel, surgical procedures, or periods of prolonged diminished ambulatory status. No recent melena or hematochezia.   Denies any associated subjective fever, chills, rigors, or generalized myalgias. No recent headache, neck stiffness, rhinitis, rhinorrhea, sore throat, abdominal pain, diarrhea, or rash. No known recent COVID-19 exposures. Denies dysuria, gross hematuria, or change in urinary urgency/frequency.  ***   ***misc/infectious: Denies any subjective fever, chills, rigors, or generalized myalgias. Denies any recent headache, neck stiffness, rhinitis, rhinorrhea, sore throat, sob, wheezing, cough, nausea, vomiting, abdominal pain, diarrhea, or rash. No recent traveling or known COVID-19 exposures. Denies dysuria, gross hematuria, or change in urinary urgency/frequency.  Denies any recent chest pain, diaphoresis, or palpitations. ***    ED Course:  Vital signs in the ED were notable for the following: ***  Labs were notable for the following: ***  Imaging and additional notable ED work-up: ***  While in the ED, the following were  administered: ***  Subsequently, the patient was admitted  ***  ***red   Review of Systems: As per HPI otherwise 10 point review of systems negative.   Past Medical History:  Diagnosis Date   Ankle sprain    left   Anxiety    treated for panic attacks in the past.   Asthma    COPD (chronic obstructive pulmonary disease) ([place])    Depression    History of radiation therapy    Right lung- 09/26/21-11/13/21- Dr. Gery Pray   Irregular heart rate    Lung cancer [facility])    Renal disorder    kidney infections    Past Surgical History:  Procedure Laterality Date   APPENDECTOMY     BALLOON DILATION N/A 03/15/2022   Procedure: BALLOON DILATION;  Surgeon: Ronnette Juniper, MD;  Location: [facility] ENDOSCOPY;  Service: Gastroenterology;  Laterality: N/A;   BRONCHIAL BIOPSY  09/21/2021   Procedure: BRONCHIAL BIOPSIES;  Surgeon: Collene Gobble, MD;  Location: [facility] ENDOSCOPY;  Service: Pulmonary;;   BRONCHIAL BRUSHINGS  09/21/2021   Procedure: BRONCHIAL BRUSHINGS;  Surgeon: Collene Gobble, MD;  Location: [facility] ENDOSCOPY;  Service: Pulmonary;;   BRONCHIAL NEEDLE ASPIRATION BIOPSY  09/21/2021   Procedure: BRONCHIAL NEEDLE ASPIRATION BIOPSIES;  Surgeon: Collene Gobble, MD;  Location: [facility] ENDOSCOPY;  Service: Pulmonary;;   ESOPHAGEAL STENT PLACEMENT N/A 03/06/2022   Procedure: ESOPHAGEAL STENT PLACEMENT;  Surgeon: Clarene Essex, MD;  Location: [facility] ENDOSCOPY;  Service: Gastroenterology;  Laterality: N/A;   ESOPHAGOGASTRODUODENOSCOPY N/A 12/26/2021   Procedure: ESOPHAGOGASTRODUODENOSCOPY (EGD);  Surgeon: Clarene Essex, MD;  Location: [facility] ENDOSCOPY;  Service: Endoscopy;  Laterality: N/A;   ESOPHAGOGASTRODUODENOSCOPY N/A 02/11/2022   Procedure: ESOPHAGOGASTRODUODENOSCOPY (EGD);  Surgeon: Arta Silence, MD;  Location: [facility] ENDOSCOPY;  Service: Gastroenterology;  Laterality: N/A;   ESOPHAGOGASTRODUODENOSCOPY N/A 03/03/2022   Procedure: ESOPHAGOGASTRODUODENOSCOPY (EGD);  Surgeon: Wilford Corner, MD;  Location: [facility] ENDOSCOPY;   Service: Gastroenterology;  Laterality: N/A;   ESOPHAGOGASTRODUODENOSCOPY N/A 03/06/2022   Procedure: ESOPHAGOGASTRODUODENOSCOPY (EGD);  Surgeon: Clarene Essex, MD;  Location: [facility] ENDOSCOPY;  Service: Gastroenterology;  Laterality: N/A;  With esophageal stent placement   ESOPHAGOGASTRODUODENOSCOPY N/A 03/15/2022   Procedure: ESOPHAGOGASTRODUODENOSCOPY (EGD);  Surgeon: Ronnette Juniper, MD;  Location: [facility]OSCOPY;  Service: Gastroenterology;  Laterality: N/A;  with dilation   ESOPHAGOGASTRODUODENOSCOPY N/A 04/18/2022   Procedure: ESOPHAGOGASTRODUODENOSCOPY (EGD);  Surgeon: Clarene Essex, MD;  Location: [facility] ENDOSCOPY;  Service: Gastroenterology;  Laterality: N/A;   ESOPHAGOGASTRODUODENOSCOPY (EGD) WITH PROPOFOL N/A 01/07/2022   Procedure: ESOPHAGOGASTRODUODENOSCOPY (EGD) WITH PROPOFOL;  Surgeon: Otis Brace, MD;  Location: [facility] ENDOSCOPY;  Service: Gastroenterology;  Laterality: N/A;   ESOPHAGOGASTRODUODENOSCOPY (EGD) WITH PROPOFOL N/A 02/02/2022   Procedure: ESOPHAGOGASTRODUODENOSCOPY (EGD) WITH PROPOFOL;  Surgeon: Ronnette Juniper, MD;  Location: [facility]OS[facility];  Service: Gastroenterology;  Laterality: N/A;   ESOPHAGOGASTRODUODENOSCOPY (EGD) WITH PROPOFOL N/A 02/04/2022   Procedure: ESOPHAGOGASTRODUODENOSCOPY (EGD) WITH PROPOFOL;  Surgeon: Otis Brace, MD;  Location: [facility] ENDOSCOPY;  Service: Gastroenterology;  Laterality: N/A;   ESOPHAGOGASTRODUODENOSCOPY (EGD) WITH PROPOFOL N/A 03/13/2022   Procedure: ESOPHAGOGASTRODUODENOSCOPY (EGD) WITH PROPOFOL;  Surgeon: Clarene Essex, MD;  Location: [facility] ENDOSCOPY;  Service: Gastroenterology;  Laterality: N/A;   ESOPHAGOGASTRODUODENOSCOPY (EGD) WITH PROPOFOL N/A 04/25/2022   Procedure: ESOPHAGOGASTRODUODENOSCOPY (EGD) WITH PROPOFOL;  Surgeon: Otis Brace, MD;  Location: [facility]OSCOPY;  Service: Gastroenterology;  Laterality: N/A;   ESOPHAGOGASTRODUODENOSCOPY (EGD) WITH PROPOFOL N/A 04/28/2022   Procedure: ESOPHAGOGASTRODUODENOSCOPY (EGD) WITH PROPOFOL;  Surgeon: Ronnette Juniper,  MD;  Location: [facility]OS[facility];  Service: Gastroenterology;  Laterality: N/A;   ESOPHAGOGASTRODUODENOSCOPY (EGD) WITH PROPOFOL N/A 05/22/2022   Procedure: ESOPHAGOGASTRODUODENOSCOPY (EGD) WITH PROPOFOL;  Surgeon: Otis Brace, MD;  Location: [facility] ENDOS[facility];  Service: Gastroenterology;  Laterality: N/A;   ESOPHAGOGASTRODUODENOSCOPY (EGD) WITH PROPOFOL N/A 06/14/2022   Procedure: ESOPHAGOGASTRODUODENOSCOPY (EGD) WITH PROPOFOL;  Surgeon: Otis Brace, MD;  Location: [facility]OS[facility];  Service: Gastroenterology;  Laterality: N/A;   ESOPHAGOSCOPY N/A 04/20/2022   Procedure: ESOPHAGOSCOPY;  Surgeon: Clarene Essex, MD;  Location: [facility]OS[facility];  Service: Gastroenterology;  Laterality: N/A;   FOREIGN BODY REMOVAL  12/26/2021   Procedure: FOREIGN BODY REMOVAL;  Surgeon: Clarene Essex, MD;  Location: [facility] ENDOSCOPY;  Service: Endoscopy;;   FOREIGN BODY REMOVAL  02/02/2022   Procedure: FOREIGN BODY REMOVAL;  Surgeon: Ronnette Juniper, MD;  Location: [facility] ENDOSCOPY;  Service: Gastroenterology;;   FOREIGN BODY REMOVAL  02/11/2022   Procedure: FOREIGN BODY REMOVAL;  Surgeon: Arta Silence, MD;  Location: [facility];  Service: Gastroenterology;;   FOREIGN BODY REMOVAL N/A 03/03/2022   Procedure: FOREIGN BODY REMOVAL;  Surgeon: Wilford Corner, MD;  Location: [facility];  Service: Gastroenterology;  Laterality: N/A;   FOREIGN BODY REMOVAL  04/18/2022   Procedure: FOREIGN BODY REMOVAL;  Surgeon: Clarene Essex, MD;  Location: [facility] ENDOS[facility];  Service: Gastroenterology;;   FOREIGN BODY REMOVAL  04/20/2022   Procedure: FOREIGN BODY REMOVAL;  Surgeon: Clarene Essex, MD;  Location: [facility] ENDOSCOPY;  Service: Gastroenterology;;  Food Impaction   FOREIGN BODY REMOVAL  04/25/2022   Procedure: FOREIGN BODY REMOVAL;  Surgeon: Otis Brace, MD;  Location: [facility] ENDOSCOPY;  Service: Gastroenterology;;  Food Impaction   FOREIGN BODY REMOVAL  04/28/2022   Procedure: FOREIGN BODY REMOVAL;  Surgeon: Ronnette Juniper, MD;  Location: [facility]OS[facility];  Service:  Gastroenterology;;   FOREIGN BODY REMOVAL  05/22/2022   Procedure: FOREIGN  BODY REMOVAL;  Surgeon: Otis Brace, MD;  Location: [facility];  Service: Gastroenterology;;   FOREIGN BODY REMOVAL  06/14/2022   Procedure: FOREIGN BODY REMOVAL;  Surgeon: Otis Brace, MD;  Location: [facility] ENDOSCOPY;  Service: Gastroenterology;;   IR CHEST FLUORO  12/27/2021   LUMBAR LAMINECTOMY N/A 05/13/2018   Procedure: LEFT LUMBAR FOUR-FIVE MICRODISCECTOMY;  Surgeon: Marybelle Killings, MD;  Location: [place];  Service: Orthopedics;  Laterality: N/A;   STENT REMOVAL  03/13/2022   Procedure: STENT REMOVAL;  Surgeon: Clarene Essex, MD;  Location: [facility];  Service: Gastroenterology;;   TUBAL LIGATION     TUBAL LIGATION  1998   VIDEO BRONCHOSCOPY WITH ENDOBRONCHIAL ULTRASOUND N/A 09/21/2021   Procedure: VIDEO BRONCHOSCOPY WITH ENDOBRONCHIAL ULTRASOUND;  Surgeon: Collene Gobble, MD;  Location: [place] ENDOSCOPY;  Service: Pulmonary;  Laterality: N/A;    Social History:  reports that she has been smoking cigarettes. She has a 15.00 pack-year smoking history. She has never used smokeless tobacco. She reports current alcohol use. She reports that she does not currently use drugs after having used the following drugs: Marijuana.   Allergies  Allergen Reactions   Compazine [Prochlorperazine] Other (See Comments)    Hypotension and lethargy   Naproxen Nausea Only   Bactrim [Sulfamethoxazole-Trimethoprim] Nausea Only    Family History  Problem Relation Age of Onset   Hypertension Mother    Hypertension Father    Heart disease Father     Family history reviewed and not pertinent ***   Prior to Admission medications   Medication Sig Start Date End Date Taking? Authorizing Provider  oxyCODONE-acetaminophen (PERCOCET/ROXICET) 5-325 MG tablet Take 1 tablet by mouth every 4 (four) hours as needed for severe pain.   Yes [provider]  albuterol (VENTOLIN HFA) 108 (90 Base) MCG/ACT inhaler Inhale 2 puffs  into the lungs every 6 (six) hours as needed for wheezing or shortness of breath. Patient not taking: Reported on 08/20/2022 02/08/22   Mercy Riding, MD     Objective    Physical Exam: Vitals:   08/20/22 1650 08/20/22 1700 08/20/22 1815 08/20/22 1930  BP: (!) 118/94 (!) 125/96 110/82 127/87  Pulse: (!) 107 (!) 105 (!) 107 (!) 110  Resp: 16  18 18   Temp: 98.4 F (36.9 C)   99.4 F (37.4 C)  TempSrc: Oral   Oral  SpO2: 99% 100% 95% 95%    General: appears to be stated age; alert, oriented Skin: warm, dry, no rash Head:  AT/[place] Mouth:  Oral mucosa membranes appear moist, normal dentition Neck: supple; trachea midline Heart:  RRR; did not appreciate any M/R/G Lungs: CTAB, did not appreciate any wheezes, rales, or rhonchi Abdomen: + BS; soft, ND, NT Vascular: 2+ pedal pulses b/l; 2+ radial pulses b/l Extremities: no peripheral edema, no muscle wasting Neuro: strength and sensation intact in upper and lower extremities b/l    *** Neuro: 5/5 strength of the proximal and distal flexors and extensors of the upper and lower extremities bilaterally; sensation intact in upper and lower extremities b/l; cranial nerves II through XII grossly intact; no pronator drift; no evidence suggestive of slurred speech, dysarthria, or facial droop; Normal muscle tone. No tremors. *** Neuro: In the setting of the patient's current mental status and associated inability to follow instructions, unable to perform full neurologic exam at this time.  As such, assessment of strength, sensation, and cranial nerves is limited at this time. Patient noted to spontaneously move all 4 extremities. No tremors.  ***  Labs on Admission: I have personally reviewed following labs and imaging studies  CBC: Recent Labs  Lab 08/16/22 0936 08/20/22 1305  WBC 8.5 8.9  NEUTROABS  --  7.0  HGB 15.1* 13.6  HCT 45.1 41.1  MCV 108.2* 107.9*  PLT 262 546   Basic Metabolic Panel: Recent Labs  Lab 08/16/22 0936  08/20/22 1305  NA 136 136  K 3.6 3.8  CL 107 107  CO2 18* 18*  GLUCOSE 93 82  BUN 15 13  CREATININE 1.33* 1.12*  CALCIUM 10.7* 10.1  MG  --  1.6*   GFR: Estimated Creatinine Clearance: 53.8 mL/min (A) (by C-G formula based on SCr of 1.12 mg/dL (H)). Liver Function Tests: Recent Labs  Lab 08/16/22 0936 08/20/22 1305  AST 30 31  ALT 22 14  ALKPHOS 132* 144*  BILITOT 0.4 0.3  PROT 9.7* 8.1  ALBUMIN 4.7 4.2   Recent Labs  Lab 08/16/22 0936  LIPASE 36   No results for input(s): "AMMONIA" in the last 168 hours. Coagulation Profile: No results for input(s): "INR", "PROTIME" in the last 168 hours. Cardiac Enzymes: No results for input(s): "CKTOTAL", "CKMB", "CKMBINDEX", "TROPONINI" in the last 168 hours. BNP (last 3 results) No results for input(s): "PROBNP" in the last 8760 hours. HbA1C: No results for input(s): "HGBA1C" in the last 72 hours. CBG: No results for input(s): "GLUCAP" in the last 168 hours. Lipid Profile: No results for input(s): "CHOL", "HDL", "LDLCALC", "TRIG", "CHOLHDL", "LDLDIRECT" in the last 72 hours. Thyroid Function Tests: No results for input(s): "TSH", "T4TOTAL", "FREET4", "T3FREE", "THYROIDAB" in the last 72 hours. Anemia Panel: No results for input(s): "VITAMINB12", "FOLATE", "FERRITIN", "TIBC", "IRON", "RETICCTPCT" in the last 72 hours. Urine analysis:    Component Value Date/Time   COLORURINE YELLOW 03/14/2022 1144   APPEARANCEUR CLEAR 03/14/2022 1144   LABSPEC 1.008 03/14/2022 1144   PHURINE 7.0 03/14/2022 1144   GLUCOSEU NEGATIVE 03/14/2022 1144   HGBUR MODERATE (A) 03/14/2022 1144   BILIRUBINUR NEGATIVE 03/14/2022 1144   KETONESUR NEGATIVE 03/14/2022 1144   PROTEINUR NEGATIVE 03/14/2022 1144   UROBILINOGEN 0.2 12/30/2013 1037   NITRITE POSITIVE (A) 03/14/2022 1144   LEUKOCYTESUR NEGATIVE 03/14/2022 1144    Radiological Exams on Admission: CT CHEST ABDOMEN PELVIS W CONTRAST  Result Date: 08/20/2022 CLINICAL DATA:  Metastatic  disease evaluation; * Tracking Code: BO * EXAM: CT CHEST, ABDOMEN, AND PELVIS WITH CONTRAST TECHNIQUE: Multidetector CT imaging of the chest, abdomen and pelvis was performed following the standard protocol during bolus administration of intravenous contrast. RADIATION DOSE REDUCTION: This exam was performed according to the departmental dose-optimization program which includes automated exposure control, adjustment of the mA and/or kV according to patient size and/or use of iterative reconstruction technique. CONTRAST:  51mL OMNIPAQUE IOHEXOL 300 MG/ML  SOLN COMPARISON:  Multiple priors, most recent chest CT dated August 16, 2022 and abdomen and pelvis CT dated Mar 25, 2022 FINDINGS: CT CHEST FINDINGS Cardiovascular: Normal heart size. Trace pericardial effusion. Normal caliber thoracic aorta with mild atherosclerotic disease. No suspicious filling defects of the central pulmonary arteries. Mediastinum/Nodes: Esophagus and thyroid are unremarkable. Right supraclavicular lymph node measuring 4.3 x 3.4 cm, unchanged when compared with recent prior chest CT, increased in size when compared with more remote priors. Mildly enlarged right hilar lymph node measuring 1.1 cm in short axis on series 2, image 27 and mild asymmetric right hilar soft tissue, unchanged compared with multiple priors. Lungs/Pleura: Central airways are patent. Severe centrilobular emphysema. Debris seen in the lower lobe  bronchi, likely due to aspiration. Reference irregular right upper lobe pulmonary nodule measuring approximately 1.4 x 0.9 cm on series 4 image 51, unchanged when compared with multiple prior exams. Few small scattered pulmonary nodules are seen right middle lobe which are unchanged when compared with recent prior, but when compared with July 01, 2022 prior. Nodule measuring 4 mm on series 4, image 85. Reference perifissural nodule measuring 4 mm on series 4, image 76. In the no pleural effusion or pneumothorax. Musculoskeletal:  Subacute or chronic appearing fractures of the anterior 5th and 6th and right ribs. Previously described proximal right humerus lesion is not included in the field of view. CT ABDOMEN PELVIS FINDINGS Hepatobiliary: Innumerable lesions are seen throughout the liver which are similar to most recent prior chest CT, but new when compared with more remote priors., index lesion of the left lobe of the liver measuring 3.1 by 0.0 cm on series 2, image 57 and index lesion of the right lobe of the liver measuring 1.9 x 1.6 cm on series 2, image 77. Gallbladder is unremarkable. No biliary ductal dilation. Pancreas: Unremarkable. No pancreatic ductal dilatation or surrounding inflammatory changes. Spleen: Normal in size without focal abnormality. Adrenals/Urinary Tract: Adrenal glands are unremarkable. Kidneys are normal, without renal calculi, focal lesion, or hydronephrosis. Bladder is unremarkable. Stomach/Bowel: Stomach is within normal limits. No evidence of bowel wall thickening, distention, or inflammatory changes. Vascular/Lymphatic: Aortic atherosclerosis. Prominent subcentimeter left periaortic lymph node measuring 9 mm on series 2, image 78, previously 3 mm. Reproductive: Uterus and bilateral adnexa are unremarkable. Other: No abdominal wall hernia or abnormality. No abdominopelvic ascites. Musculoskeletal: New mild endplate compression deformity of L5. No aggressive appearing osseous lesions. IMPRESSION: 1. Bulky right supraclavicular node unchanged when compared with recent prior chest CT, but increased in size when compared with more remote priors. 2. Irregular right upper lobe pulmonary nodule and mildly enlarged right hilar lymph node, unchanged compared with multiple priors and likely posttreatment changes. 3. Small solid pulmonary nodules are seen in the right middle lobe which are unchanged when compared with recent prior chest CT but new when compared with more remote priors, possibly to infection or  aspiration, although metastatic disease also a concern. 4. Scattered debris seen in the right lower lobe bronchi, compatible with aspiration. 5. Innumerable lesions are seen throughout the liver which are similar to recent chest CT, but new when compared with more remote priors. 6. Interval increased size of subcentimeter left para-aortic lymph node compared with first 2023 abdomen and pelvis CT, concerning for metastatic disease. 7. Lesion of the right proximal humerus which was described on recent prior chest CT is not included in the field of view. 8. Aortic Atherosclerosis (ICD10-I70.0) and Emphysema (ICD10-J43.9). Electronically Signed   By: Yetta Glassman M.D.   On: 08/20/2022 15:27   CT L-SPINE NO CHARGE  Result Date: 08/20/2022 CLINICAL DATA:  Metastatic cancer.  Back pain. EXAM: CT THORACIC AND LUMBAR SPINE WITHOUT CONTRAST TECHNIQUE: Multidetector CT imaging of the thoracic and lumbar spine was performed without contrast. Multiplanar CT image reconstructions were also generated. RADIATION DOSE REDUCTION: This exam was performed according to the departmental dose-optimization program which includes automated exposure control, adjustment of the mA and/or kV according to patient size and/or use of iterative reconstruction technique. COMPARISON:  Chest CT 08/16/2022 and CT chest, abdomen/pelvis from today. FINDINGS: CT THORACIC SPINE FINDINGS Alignment: Normal Vertebrae: Stable right-sided inferior endplate defect of T5. No acute fracture. No lytic destructive bone lesion is identified. Paraspinal and  other soft tissues: 5 cm right supraclavicular nodal mass is noted. Stable advanced emphysematous changes are noted in the lungs along with scattered pulmonary nodules. No pleural effusions. Diffuse hepatic metastatic disease is again noted. Disc levels: No large thoracic disc protrusions, spinal or foraminal stenosis. CT LUMBAR SPINE FINDINGS Segmentation: There are five lumbar type vertebral bodies. The  last full intervertebral disc space is labeled L5-S1. Alignment: Normal Vertebrae: New superior endplate fracture of L4 without obvious underlying pathologic lesion. No other lumbar fractures are identified. The facets are normally aligned. No pars defects. The visualized bony pelvis is intact. No obvious destructive lesions. Paraspinal and other soft tissues: Diffuse hepatic metastatic disease is demonstrated. Age advanced aortic and iliac artery calcifications but no aneurysm. No retroperitoneal adenopathy. Disc levels: L1-2: No significant findings. L2-3: Left paracentral disc protrusion with mild mass effect on the left side of the thecal sac possibly impinging on the left L3 nerve root in the lateral recess. Mild foraminal encroachment also. L3-4: Shallow broad-based right foraminal and extraforaminal disc protrusion potentially irritating the right L3 nerve root. There is also mild right lateral recess stenosis which could affect the right L4 nerve root. L4-5: Mild diffuse annular bulge and moderate facet disease but no disc protrusion or foraminal stenosis. Suspect left-sided laminotomy. L5-S1: Diffuse annular bulge asymmetric right but no significant neural compression. IMPRESSION: 1. Stable right-sided inferior endplate defect of T5. No acute or significant findings in the thoracic spine. 2. Diffuse hepatic metastatic disease. 3. New superior endplate fracture of L4 without obvious underlying pathologic lesion. 4. Left paracentral disc protrusion at L2-3 possibly impinging on the left L3 nerve root in the lateral recess. 5. Shallow broad-based right foraminal and extraforaminal disc protrusion at L3-4 potentially irritating the right L3 nerve root in the lateral recess. 6. Bulging discs at L4-5 and L5-S1. Aortic Atherosclerosis (ICD10-I70.0). Electronically Signed   By: Marijo Sanes M.D.   On: 08/20/2022 15:06   CT T-SPINE NO CHARGE  Result Date: 08/20/2022 CLINICAL DATA:  Metastatic cancer.  Back  pain. EXAM: CT THORACIC AND LUMBAR SPINE WITHOUT CONTRAST TECHNIQUE: Multidetector CT imaging of the thoracic and lumbar spine was performed without contrast. Multiplanar CT image reconstructions were also generated. RADIATION DOSE REDUCTION: This exam was performed according to the departmental dose-optimization program which includes automated exposure control, adjustment of the mA and/or kV according to patient size and/or use of iterative reconstruction technique. COMPARISON:  Chest CT 08/16/2022 and CT chest, abdomen/pelvis from today. FINDINGS: CT THORACIC SPINE FINDINGS Alignment: Normal Vertebrae: Stable right-sided inferior endplate defect of T5. No acute fracture. No lytic destructive bone lesion is identified. Paraspinal and other soft tissues: 5 cm right supraclavicular nodal mass is noted. Stable advanced emphysematous changes are noted in the lungs along with scattered pulmonary nodules. No pleural effusions. Diffuse hepatic metastatic disease is again noted. Disc levels: No large thoracic disc protrusions, spinal or foraminal stenosis. CT LUMBAR SPINE FINDINGS Segmentation: There are five lumbar type vertebral bodies. The last full intervertebral disc space is labeled L5-S1. Alignment: Normal Vertebrae: New superior endplate fracture of L4 without obvious underlying pathologic lesion. No other lumbar fractures are identified. The facets are normally aligned. No pars defects. The visualized bony pelvis is intact. No obvious destructive lesions. Paraspinal and other soft tissues: Diffuse hepatic metastatic disease is demonstrated. Age advanced aortic and iliac artery calcifications but no aneurysm. No retroperitoneal adenopathy. Disc levels: L1-2: No significant findings. L2-3: Left paracentral disc protrusion with mild mass effect on the left side of  the thecal sac possibly impinging on the left L3 nerve root in the lateral recess. Mild foraminal encroachment also. L3-4: Shallow broad-based right  foraminal and extraforaminal disc protrusion potentially irritating the right L3 nerve root. There is also mild right lateral recess stenosis which could affect the right L4 nerve root. L4-5: Mild diffuse annular bulge and moderate facet disease but no disc protrusion or foraminal stenosis. Suspect left-sided laminotomy. L5-S1: Diffuse annular bulge asymmetric right but no significant neural compression. IMPRESSION: 1. Stable right-sided inferior endplate defect of T5. No acute or significant findings in the thoracic spine. 2. Diffuse hepatic metastatic disease. 3. New superior endplate fracture of L4 without obvious underlying pathologic lesion. 4. Left paracentral disc protrusion at L2-3 possibly impinging on the left L3 nerve root in the lateral recess. 5. Shallow broad-based right foraminal and extraforaminal disc protrusion at L3-4 potentially irritating the right L3 nerve root in the lateral recess. 6. Bulging discs at L4-5 and L5-S1. Aortic Atherosclerosis (ICD10-I70.0). Electronically Signed   By: Marijo Sanes M.D.   On: 08/20/2022 15:06     EKG: Independently reviewed, with result as described above. ***   Assessment/Plan    Principal Problem:   Back pain  ***      ***          ***           ***          ***          ***          ***          ***          ***     ***  DVT prophylaxis: SCD's ***  Code Status: Full code*** Family Communication: none*** Disposition Plan: Per Rounding Team Consults called: none***;  Admission status: ***   PLEASE NOTE THAT DRAGON DICTATION SOFTWARE WAS USED IN THE CONSTRUCTION OF THIS NOTE.   [place] DO [facility]   08/20/2022, 7:50 PM   ***

## 2022-08-20 NOTE — ED Provider Notes (Signed)
[facility] EMERGENCY DEPT Provider Note   CSN: [id number] Arrival date & time: 08/20/22  1035     History Chief Complaint  Patient presents with   Back Pain    [name] is a 51 y.o. 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 She reports that her back pain has been worsening over the past couple of days.  Denies any trauma or falls.  Patient reports that her pain is diffuse on her back and radiates into her bottom.  She denies any saddle anesthesia, urinary incontinence, fecal incontinence, fever, or weakness to her legs.  She tried 2 of her oxycodones last night with minimal relief.  Reports that she tried following up with her oncologist, Dr. Julien Nordmann, however never received a call back for follow-up for palliative care.   Back Pain Associated symptoms: no abdominal pain, no chest pain, no dysuria, no fever, no numbness and no weakness        Home Medications Prior to Admission medications   Medication Sig Start Date End Date Taking? Authorizing Provider  oxyCODONE-acetaminophen (PERCOCET/ROXICET) 5-325 MG tablet Take 1 tablet by mouth every 4 (four) hours as needed for severe pain.   Yes [provider]  albuterol (VENTOLIN HFA) 108 (90 Base) MCG/ACT inhaler Inhale 2 puffs into the lungs every 6 (six) hours as needed for wheezing or shortness of breath. Patient not taking: Reported on 08/20/2022 02/08/22   Mercy Riding, MD      Allergies    Compazine [prochlorperazine], Naproxen, and Bactrim [sulfamethoxazole-trimethoprim]    Review of Systems   Review of Systems  Constitutional:  Negative for chills and fever.  Respiratory:  Negative for shortness of breath.   Cardiovascular:   Negative for chest pain.  Gastrointestinal:  Negative for abdominal pain, nausea and vomiting.  Genitourinary:  Negative for dysuria, hematuria, vaginal bleeding, vaginal discharge and vaginal pain.  Musculoskeletal:  Positive for back pain. Negative for neck pain.  Skin:  Negative for rash and wound.  Neurological:  Negative for weakness and numbness.    Physical Exam Updated Vital Signs BP 110/82   Pulse (!) 107   Temp 98.4 F (36.9 C) (Oral)   Resp 18   LMP 04/24/2018   SpO2 95%  Physical Exam Vitals and nursing note reviewed.  Constitutional:      Comments: Patient unable to find a comfortable position, tearful  HENT:     Mouth/Throat:     Mouth: Mucous membranes are moist.  Cardiovascular:     Rate and Rhythm: Tachycardia present.  Pulmonary:     Effort: Pulmonary effort is normal. No respiratory distress.  Abdominal:     Palpations: Abdomen is soft.     Tenderness: There is no abdominal tenderness. There is no guarding or rebound.  Musculoskeletal:        General: Tenderness present.     Comments: Patient has diffuse thoracic and lumbar paraspinal and midline tenderness, no specific point tenderness.  No overlying skin changes noted.  No step-offs or deformities.  Pain in her back is worsened with straight leg raise bilaterally.  Lower legs sensation intact, cap refill brisk, palpable pulses, compartments are soft, strength intact and symmetric.  Skin:    General: Skin is warm and dry.  Neurological:     General: No  focal deficit present.     Mental Status: She is alert.     ED Results / Procedures / Treatments   Labs (all labs ordered are listed, but only abnormal results are displayed) Labs Reviewed  CBC WITH DIFFERENTIAL/PLATELET - Abnormal; Notable for the following components:      Result Value   RBC 3.81 (*)    MCV 107.9 (*)    MCH 35.7 (*)    Abs Immature Granulocytes 0.13 (*)    All other components within normal limits  COMPREHENSIVE METABOLIC PANEL  - Abnormal; Notable for the following components:   CO2 18 (*)    Creatinine, Ser 1.12 (*)    Alkaline Phosphatase 144 (*)    GFR, Estimated 60 (*)    All other components within normal limits  MAGNESIUM - Abnormal; Notable for the following components:   Magnesium 1.6 (*)    All other components within normal limits  URINALYSIS, ROUTINE W REFLEX MICROSCOPIC    EKG None  Radiology CT CHEST ABDOMEN PELVIS W CONTRAST  Result Date: 08/20/2022 CLINICAL DATA:  Metastatic disease evaluation; * Tracking Code: BO * EXAM: CT CHEST, ABDOMEN, AND PELVIS WITH CONTRAST TECHNIQUE: Multidetector CT imaging of the chest, abdomen and pelvis was performed following the standard protocol during bolus administration of intravenous contrast. RADIATION DOSE REDUCTION: This exam was performed according to the departmental dose-optimization program which includes automated exposure control, adjustment of the mA and/or kV according to patient size and/or use of iterative reconstruction technique. CONTRAST:  18m OMNIPAQUE IOHEXOL 300 MG/ML  SOLN COMPARISON:  Multiple priors, most recent chest CT dated August 16, 2022 and abdomen and pelvis CT dated Mar 25, 2022 FINDINGS: CT CHEST FINDINGS Cardiovascular: Normal heart size. Trace pericardial effusion. Normal caliber thoracic aorta with mild atherosclerotic disease. No suspicious filling defects of the central pulmonary arteries. Mediastinum/Nodes: Esophagus and thyroid are unremarkable. Right supraclavicular lymph node measuring 4.3 x 3.4 cm, unchanged when compared with recent prior chest CT, increased in size when compared with more remote priors. Mildly enlarged right hilar lymph node measuring 1.1 cm in short axis on series 2, image 27 and mild asymmetric right hilar soft tissue, unchanged compared with multiple priors. Lungs/Pleura: Central airways are patent. Severe centrilobular emphysema. Debris seen in the lower lobe bronchi, likely due to aspiration. Reference  irregular right upper lobe pulmonary nodule measuring approximately 1.4 x 0.9 cm on series 4 image 51, unchanged when compared with multiple prior exams. Few small scattered pulmonary nodules are seen right middle lobe which are unchanged when compared with recent prior, but when compared with July 01, 2022 prior. Nodule measuring 4 mm on series 4, image 85. Reference perifissural nodule measuring 4 mm on series 4, image 76. In the no pleural effusion or pneumothorax. Musculoskeletal: Subacute or chronic appearing fractures of the anterior 5th and 6th and right ribs. Previously described proximal right humerus lesion is not included in the field of view. CT ABDOMEN PELVIS FINDINGS Hepatobiliary: Innumerable lesions are seen throughout the liver which are similar to most recent prior chest CT, but new when compared with more remote priors., index lesion of the left lobe of the liver measuring 3.1 by 0.0 cm on series 2, image 57 and index lesion of the right lobe of the liver measuring 1.9 x 1.6 cm on series 2, image 77. Gallbladder is unremarkable. No biliary ductal dilation. Pancreas: Unremarkable. No pancreatic ductal dilatation or surrounding inflammatory changes. Spleen: Normal in size without focal abnormality. Adrenals/Urinary Tract: Adrenal glands  are unremarkable. Kidneys are normal, without renal calculi, focal lesion, or hydronephrosis. Bladder is unremarkable. Stomach/Bowel: Stomach is within normal limits. No evidence of bowel wall thickening, distention, or inflammatory changes. Vascular/Lymphatic: Aortic atherosclerosis. Prominent subcentimeter left periaortic lymph node measuring 9 mm on series 2, image 78, previously 3 mm. Reproductive: Uterus and bilateral adnexa are unremarkable. Other: No abdominal wall hernia or abnormality. No abdominopelvic ascites. Musculoskeletal: New mild endplate compression deformity of L5. No aggressive appearing osseous lesions. IMPRESSION: 1. Bulky right supraclavicular  node unchanged when compared with recent prior chest CT, but increased in size when compared with more remote priors. 2. Irregular right upper lobe pulmonary nodule and mildly enlarged right hilar lymph node, unchanged compared with multiple priors and likely posttreatment changes. 3. Small solid pulmonary nodules are seen in the right middle lobe which are unchanged when compared with recent prior chest CT but new when compared with more remote priors, possibly to infection or aspiration, although metastatic disease also a concern. 4. Scattered debris seen in the right lower lobe bronchi, compatible with aspiration. 5. Innumerable lesions are seen throughout the liver which are similar to recent chest CT, but new when compared with more remote priors. 6. Interval increased size of subcentimeter left para-aortic lymph node compared with first 2023 abdomen and pelvis CT, concerning for metastatic disease. 7. Lesion of the right proximal humerus which was described on recent prior chest CT is not included in the field of view. 8. Aortic Atherosclerosis (ICD10-I70.0) and Emphysema (ICD10-J43.9). Electronically Signed   By: Yetta Glassman M.D.   On: 08/20/2022 15:27   CT L-SPINE NO CHARGE  Result Date: 08/20/2022 CLINICAL DATA:  Metastatic cancer.  Back pain. EXAM: CT THORACIC AND LUMBAR SPINE WITHOUT CONTRAST TECHNIQUE: Multidetector CT imaging of the thoracic and lumbar spine was performed without contrast. Multiplanar CT image reconstructions were also generated. RADIATION DOSE REDUCTION: This exam was performed according to the departmental dose-optimization program which includes automated exposure control, adjustment of the mA and/or kV according to patient size and/or use of iterative reconstruction technique. COMPARISON:  Chest CT 08/16/2022 and CT chest, abdomen/pelvis from today. FINDINGS: CT THORACIC SPINE FINDINGS Alignment: Normal Vertebrae: Stable right-sided inferior endplate defect of T5. No acute  fracture. No lytic destructive bone lesion is identified. Paraspinal and other soft tissues: 5 cm right supraclavicular nodal mass is noted. Stable advanced emphysematous changes are noted in the lungs along with scattered pulmonary nodules. No pleural effusions. Diffuse hepatic metastatic disease is again noted. Disc levels: No large thoracic disc protrusions, spinal or foraminal stenosis. CT LUMBAR SPINE FINDINGS Segmentation: There are five lumbar type vertebral bodies. The last full intervertebral disc space is labeled L5-S1. Alignment: Normal Vertebrae: New superior endplate fracture of L4 without obvious underlying pathologic lesion. No other lumbar fractures are identified. The facets are normally aligned. No pars defects. The visualized bony pelvis is intact. No obvious destructive lesions. Paraspinal and other soft tissues: Diffuse hepatic metastatic disease is demonstrated. Age advanced aortic and iliac artery calcifications but no aneurysm. No retroperitoneal adenopathy. Disc levels: L1-2: No significant findings. L2-3: Left paracentral disc protrusion with mild mass effect on the left side of the thecal sac possibly impinging on the left L3 nerve root in the lateral recess. Mild foraminal encroachment also. L3-4: Shallow broad-based right foraminal and extraforaminal disc protrusion potentially irritating the right L3 nerve root. There is also mild right lateral recess stenosis which could affect the right L4 nerve root. L4-5: Mild diffuse annular bulge and moderate facet  disease but no disc protrusion or foraminal stenosis. Suspect left-sided laminotomy. L5-S1: Diffuse annular bulge asymmetric right but no significant neural compression. IMPRESSION: 1. Stable right-sided inferior endplate defect of T5. No acute or significant findings in the thoracic spine. 2. Diffuse hepatic metastatic disease. 3. New superior endplate fracture of L4 without obvious underlying pathologic lesion. 4. Left paracentral  disc protrusion at L2-3 possibly impinging on the left L3 nerve root in the lateral recess. 5. Shallow broad-based right foraminal and extraforaminal disc protrusion at L3-4 potentially irritating the right L3 nerve root in the lateral recess. 6. Bulging discs at L4-5 and L5-S1. Aortic Atherosclerosis (ICD10-I70.0). Electronically Signed   By: Marijo Sanes M.D.   On: 08/20/2022 15:06   CT T-SPINE NO CHARGE  Result Date: 08/20/2022 CLINICAL DATA:  Metastatic cancer.  Back pain. EXAM: CT THORACIC AND LUMBAR SPINE WITHOUT CONTRAST TECHNIQUE: Multidetector CT imaging of the thoracic and lumbar spine was performed without contrast. Multiplanar CT image reconstructions were also generated. RADIATION DOSE REDUCTION: This exam was performed according to the departmental dose-optimization program which includes automated exposure control, adjustment of the mA and/or kV according to patient size and/or use of iterative reconstruction technique. COMPARISON:  Chest CT 08/16/2022 and CT chest, abdomen/pelvis from today. FINDINGS: CT THORACIC SPINE FINDINGS Alignment: Normal Vertebrae: Stable right-sided inferior endplate defect of T5. No acute fracture. No lytic destructive bone lesion is identified. Paraspinal and other soft tissues: 5 cm right supraclavicular nodal mass is noted. Stable advanced emphysematous changes are noted in the lungs along with scattered pulmonary nodules. No pleural effusions. Diffuse hepatic metastatic disease is again noted. Disc levels: No large thoracic disc protrusions, spinal or foraminal stenosis. CT LUMBAR SPINE FINDINGS Segmentation: There are five lumbar type vertebral bodies. The last full intervertebral disc space is labeled L5-S1. Alignment: Normal Vertebrae: New superior endplate fracture of L4 without obvious underlying pathologic lesion. No other lumbar fractures are identified. The facets are normally aligned. No pars defects. The visualized bony pelvis is intact. No obvious  destructive lesions. Paraspinal and other soft tissues: Diffuse hepatic metastatic disease is demonstrated. Age advanced aortic and iliac artery calcifications but no aneurysm. No retroperitoneal adenopathy. Disc levels: L1-2: No significant findings. L2-3: Left paracentral disc protrusion with mild mass effect on the left side of the thecal sac possibly impinging on the left L3 nerve root in the lateral recess. Mild foraminal encroachment also. L3-4: Shallow broad-based right foraminal and extraforaminal disc protrusion potentially irritating the right L3 nerve root. There is also mild right lateral recess stenosis which could affect the right L4 nerve root. L4-5: Mild diffuse annular bulge and moderate facet disease but no disc protrusion or foraminal stenosis. Suspect left-sided laminotomy. L5-S1: Diffuse annular bulge asymmetric right but no significant neural compression. IMPRESSION: 1. Stable right-sided inferior endplate defect of T5. No acute or significant findings in the thoracic spine. 2. Diffuse hepatic metastatic disease. 3. New superior endplate fracture of L4 without obvious underlying pathologic lesion. 4. Left paracentral disc protrusion at L2-3 possibly impinging on the left L3 nerve root in the lateral recess. 5. Shallow broad-based right foraminal and extraforaminal disc protrusion at L3-4 potentially irritating the right L3 nerve root in the lateral recess. 6. Bulging discs at L4-5 and L5-S1. Aortic Atherosclerosis (ICD10-I70.0). Electronically Signed   By: Marijo Sanes M.D.   On: 08/20/2022 15:06    Procedures Procedures   Medications Ordered in ED Medications  magnesium sulfate IVPB 2 g 50 mL (2 g Intravenous New Bag/Given 08/20/22 1736)  ondansetron (ZOFRAN-ODT) disintegrating tablet 4 mg (4 mg Oral Given 08/20/22 1225)  HYDROmorphone (DILAUDID) injection 0.5 mg (0.5 mg Intravenous Given 08/20/22 1317)  lactated ringers bolus 1,000 mL (0 mLs Intravenous Stopped 08/20/22 1425)  iohexol  (OMNIPAQUE) 300 MG/ML solution 100 mL (85 mLs Intravenous Contrast Given 08/20/22 1423)  HYDROmorphone (DILAUDID) injection 0.5 mg (0.5 mg Intravenous Given 08/20/22 1647)    ED Course/ Medical Decision Making/ A&P                           Medical Decision Making Amount and/or Complexity of Data Reviewed Labs: ordered. Radiology: ordered.  Risk Prescription drug management. Decision regarding hospitalization.   51 year old female presents to the emergency room for evaluation of nontraumatic back pain worsening for the past few days.  Differential diagnosis includes was limited to malignancy, muscular strain, muscle sprain, sciatica.  Vital signs show mild tachycardia otherwise afebrile, normotensive, satting well room air without increased work of breathing.  Physical exam as noted above.  On previous chart review, patient was seen on 08-16-2022 and was told that her cancer metastasized and she has an increased lymph node in her supraclavicular area as well as metastases seen to her liver, and no to her right humerus.  Patient has not received active chemoradiation since around January 2023.  She follows Dr. Julien Nordmann at [facility] for oncology.  She has known about the increased lymph node due to her appointment on August in 2023.  She was post to follow-up with a PET scan however she does not want to do any chemo or radiation.  When she was discharged in September 2023, she was called.  Consult oncologist for palliative care however did not receive a call back.  Given that she only received a CT PE study and her back pain is in her lower thoracic/lumbar area, I do think the patient benefit from a CT abdomen pelvis as well as a thoracic and lumbar imaging.  I independently reviewed and interpreted the patient's labs.  CBC shows no [place] ptosis or anemia.  Normal platelets.  CMP shows mild decrease in bicarb at 18.  Creatinine at 1.12 was improved from patient's previous.  Alk phos elevated at  144.  Magnesium decreased at 1.6.  Urinalysis pending.  Gave the patient Dilaudid x2 as well as a liter of fluid.  CT imaging of thoracic and lumbar spine shows 1. Stable right-sided inferior endplate defect of T5. No acute or significant findings in the thoracic spine. 2. Diffuse hepatic metastatic disease. 3. New superior endplate fracture of L4 without obvious underlying pathologic lesion. 4. Left paracentral disc protrusion at L2-3 possibly impinging on the left L3 nerve root in the lateral recess. 5. Shallow broad-based right foraminal and extraforaminal disc protrusion at L3-4 potentially irritating the right L3 nerve root in the lateral recess. 6. Bulging discs at L4-5 and L5-S1.  CT of the chest, abdomen, pelvis shows 1. Bulky right supraclavicular node unchanged when compared with recent prior chest CT, but increased in size when compared with more remote priors. 2. Irregular right upper lobe pulmonary nodule and mildly enlarged right hilar lymph node, unchanged compared with multiple priors and likely posttreatment changes. 3. Small solid pulmonary nodules are seen in the right middle lobe which are unchanged when compared with recent prior chest CT but new when compared with more remote priors, possibly to infection or aspiration, although metastatic disease also a concern. 4. Scattered debris seen in the  right lower lobe bronchi, compatible with aspiration. 5. Innumerable lesions are seen throughout the liver which are similar to recent chest CT, but new when compared with more remote priors. 6. Interval increased size of subcentimeter left para-aortic lymph node compared with first 2023 abdomen and pelvis CT, concerning for metastatic disease. 7. Lesion of the right proximal humerus which was described on recent prior chest CT is not included in the field of view. 8. Aortic Atherosclerosis  Given the patient's metastatic disease initially causing fracture to her back, I think she can benefit for  admission for pain control and for follow-up with palliative care given that she does not wish to proceed with any chemo or radiation.  I discussed with the patient and she is amenable.  Admitted to medicine with Dr. Tawanna Solo.  I discussed this case with my attending physician who cosigned this note including patient's presenting symptoms, physical exam, and planned diagnostics and interventions. Attending physician stated agreement with plan or made changes to plan which were implemented.   Final Clinical Impression(s) / ED Diagnoses Final diagnoses:  Acute low back pain with bilateral sciatica, unspecified back pain laterality  Malignant bone pain    Rx / DC Orders ED Discharge Orders     None         Sherrell Puller, PA-C 08/20/22 1847    Pattricia Boss, MD 08/21/22 [phone]

## 2022-08-20 NOTE — ED Notes (Signed)
Pt reports she is not going to do any further treatment. She was dx with Lung Cancer last October

## 2022-08-21 ENCOUNTER — Observation Stay (HOSPITAL_COMMUNITY): Payer: Medicaid Other

## 2022-08-21 DIAGNOSIS — M5459 Other low back pain: Secondary | ICD-10-CM | POA: Diagnosis present

## 2022-08-21 DIAGNOSIS — M898X9 Other specified disorders of bone, unspecified site: Secondary | ICD-10-CM

## 2022-08-21 DIAGNOSIS — S32049A Unspecified fracture of fourth lumbar vertebra, initial encounter for closed fracture: Secondary | ICD-10-CM | POA: Diagnosis present

## 2022-08-21 DIAGNOSIS — G893 Neoplasm related pain (acute) (chronic): Secondary | ICD-10-CM

## 2022-08-21 LAB — PROTIME-INR
INR: 1 (ref 0.8–1.2)
Prothrombin Time: 13.4 seconds (ref 11.4–15.2)

## 2022-08-21 LAB — COMPREHENSIVE METABOLIC PANEL
ALT: 20 U/L (ref 0–44)
AST: 39 U/L (ref 15–41)
Albumin: 3.3 g/dL — ABNORMAL LOW (ref 3.5–5.0)
Alkaline Phosphatase: 138 U/L — ABNORMAL HIGH (ref 38–126)
Anion gap: 7 (ref 5–15)
BUN: 13 mg/dL (ref 6–20)
CO2: 22 mmol/L (ref 22–32)
Calcium: 9 mg/dL (ref 8.9–10.3)
Chloride: 106 mmol/L (ref 98–111)
Creatinine, Ser: 0.94 mg/dL (ref 0.44–1.00)
GFR, Estimated: >60 mL/min (ref >60)
Glucose, Bld: 105 mg/dL — ABNORMAL HIGH (ref 70–99)
Potassium: 3.5 mmol/L (ref 3.5–5.1)
Sodium: 135 mmol/L (ref 135–145)
Total Bilirubin: 0.3 mg/dL (ref 0.3–1.2)
Total Protein: 7 g/dL (ref 6.5–8.1)

## 2022-08-21 LAB — CBC WITH DIFFERENTIAL/PLATELET
Abs Immature Granulocytes: 0.1 10*3/uL — ABNORMAL HIGH (ref 0.00–0.07)
Basophils Absolute: 0 10*3/uL (ref 0.0–0.1)
Basophils Relative: 1 %
Eosinophils Absolute: 0.1 10*3/uL (ref 0.0–0.5)
Eosinophils Relative: 1 %
HCT: 35.3 % — ABNORMAL LOW (ref 36.0–46.0)
Hemoglobin: 11.7 g/dL — ABNORMAL LOW (ref 12.0–15.0)
Immature Granulocytes: 2 %
Lymphocytes Relative: 17 %
Lymphs Abs: 1.1 10*3/uL (ref 0.7–4.0)
MCH: 35.1 pg — ABNORMAL HIGH (ref 26.0–34.0)
MCHC: 33.1 g/dL (ref 30.0–36.0)
MCV: 106 fL — ABNORMAL HIGH (ref 80.0–100.0)
Monocytes Absolute: 0.6 10*3/uL (ref 0.1–1.0)
Monocytes Relative: 10 %
Neutro Abs: 4.4 10*3/uL (ref 1.7–7.7)
Neutrophils Relative %: 69 %
Platelets: 208 10*3/uL (ref 150–400)
RBC: 3.33 MIL/uL — ABNORMAL LOW (ref 3.87–5.11)
RDW: 13.1 % (ref 11.5–15.5)
WBC: 6.2 10*3/uL (ref 4.0–10.5)
nRBC: 0 % (ref 0.0–0.2)

## 2022-08-21 LAB — MAGNESIUM: Magnesium: 1.8 mg/dL (ref 1.7–2.4)

## 2022-08-21 MED ORDER — KETOROLAC TROMETHAMINE 15 MG/ML IJ SOLN
15.0000 mg | Freq: Four times a day (QID) | INTRAMUSCULAR | Status: DC | PRN
  Administered 2022-08-21 – 2022-08-22 (×3): 15 mg via INTRAVENOUS
  Filled 2022-08-21 (×3): qty 1

## 2022-08-21 MED ORDER — ENOXAPARIN SODIUM 30 MG/0.3ML IJ SOSY
30.0000 mg | PREFILLED_SYRINGE | INTRAMUSCULAR | Status: DC
  Administered 2022-08-21 – 2022-08-22 (×2): 30 mg via SUBCUTANEOUS
  Filled 2022-08-21 (×2): qty 0.3

## 2022-08-21 MED ORDER — NICOTINE 14 MG/24HR TD PT24: 14.0000 mg | MEDICATED_PATCH | Freq: Every day | TRANSDERMAL | Status: DC | PRN

## 2022-08-21 MED ORDER — MELATONIN 5 MG PO TABS
5.0000 mg | ORAL_TABLET | Freq: Once | ORAL | Status: AC
  Administered 2022-08-21: 5 mg via ORAL
  Filled 2022-08-21: qty 1

## 2022-08-21 MED ORDER — HYDROMORPHONE HCL 1 MG/ML IJ SOLN
1.0000 mg | INTRAMUSCULAR | Status: DC | PRN
  Administered 2022-08-21 (×2): 1 mg via INTRAVENOUS
  Filled 2022-08-21 (×2): qty 1

## 2022-08-21 MED ORDER — MELATONIN 5 MG PO TABS
5.0000 mg | ORAL_TABLET | Freq: Every evening | ORAL | Status: DC | PRN
  Administered 2022-08-22: 5 mg via ORAL
  Filled 2022-08-21: qty 1

## 2022-08-21 MED ORDER — HYDROMORPHONE HCL 1 MG/ML IJ SOLN
1.0000 mg | INTRAMUSCULAR | Status: DC | PRN
  Administered 2022-08-21 – 2022-08-22 (×6): 1 mg via INTRAVENOUS
  Filled 2022-08-21 (×7): qty 1

## 2022-08-21 MED ORDER — MAGNESIUM SULFATE 2 GM/50ML IV SOLN
2.0000 g | Freq: Once | INTRAVENOUS | Status: AC
  Administered 2022-08-21: 2 g via INTRAVENOUS
  Filled 2022-08-21: qty 50

## 2022-08-21 MED ORDER — SENNOSIDES-DOCUSATE SODIUM 8.6-50 MG PO TABS
1.0000 | ORAL_TABLET | Freq: Two times a day (BID) | ORAL | Status: DC
  Administered 2022-08-21 – 2022-08-23 (×5): 1 via ORAL
  Filled 2022-08-21 (×5): qty 1

## 2022-08-21 MED ORDER — POTASSIUM CHLORIDE CRYS ER 10 MEQ PO TBCR
20.0000 meq | EXTENDED_RELEASE_TABLET | Freq: Once | ORAL | Status: AC
  Administered 2022-08-21: 20 meq via ORAL
  Filled 2022-08-21: qty 2

## 2022-08-21 MED ORDER — LIDOCAINE 5 % EX PTCH
1.0000 | MEDICATED_PATCH | CUTANEOUS | Status: DC
  Administered 2022-08-21 – 2022-08-22 (×2): 1 via TRANSDERMAL
  Filled 2022-08-21 (×2): qty 1

## 2022-08-21 MED ORDER — GADOPICLENOL 0.5 MMOL/ML IV SOLN
6.0000 mL | Freq: Once | INTRAVENOUS | Status: AC | PRN
  Administered 2022-08-21: 6 mL via INTRAVENOUS

## 2022-08-21 MED ORDER — OXYCODONE-ACETAMINOPHEN 5-325 MG PO TABS
1.0000 | ORAL_TABLET | ORAL | Status: DC | PRN
  Administered 2022-08-21 – 2022-08-22 (×5): 1 via ORAL
  Filled 2022-08-21 (×5): qty 1

## 2022-08-21 MED ORDER — NICOTINE 21 MG/24HR TD PT24
21.0000 mg | MEDICATED_PATCH | Freq: Every day | TRANSDERMAL | Status: DC
  Administered 2022-08-21 – 2022-08-23 (×3): 21 mg via TRANSDERMAL
  Filled 2022-08-21 (×3): qty 1

## 2022-08-21 NOTE — TOC Initial Note (Signed)
Transition of Care [facility]) - Initial/Assessment Note    Patient Details  Name: [name] MRN: [id number] Date of Birth: [birth year]  Transition of Care [facility]) CM/SW Contact:    Leeroy Cha, RN Phone Number: 08/21/2022, 8:02 AM  Clinical Narrative:                  Transition of Care [facility]) Screening Note   Patient Details  Name: [name] Date of Birth: [birth year]   Transition of Care [facility]) CM/SW Contact:    Leeroy Cha, RN Phone Number: 08/21/2022, 8:03 AM    Transition of Care Department (TOC) has reviewed patient and no TOC needs have been identified at this time. We will continue to monitor patient advancement through interdisciplinary progression rounds. If new patient transition needs arise, please place a TOC consult.    Expected Discharge Plan: Home/Self Care Barriers to Discharge: Continued Medical Work up   Patient Goals and CMS Choice Patient states their goals for this hospitalization and ongoing recovery are:: to go home CMS Medicare.gov Compare Post Acute Care list provided to:: Patient    Expected Discharge Plan and Services Expected Discharge Plan: Home/Self Care   Discharge Planning Services: CM Consult   Living arrangements for the past 2 months: Single Family Home                                      Prior Living Arrangements/Services Living arrangements for the past 2 months: Single Family Home Lives with:: Self Patient language and need for interpreter reviewed:: Yes Do you feel safe going back to the place where you live?: Yes            Criminal Activity/Legal Involvement Pertinent to Current Situation/Hospitalization: No - Comment as needed  Activities of Daily Living Home Assistive Devices/Equipment: None ADL Screening (condition at time of admission) Patient's cognitive ability adequate to safely complete daily activities?: No Is the patient deaf or have difficulty hearing?: No Does the patient have  difficulty seeing, even when wearing glasses/contacts?: No Does the patient have difficulty concentrating, remembering, or making decisions?: No Patient able to express need for assistance with ADLs?: No Does the patient have difficulty dressing or bathing?: No Independently performs ADLs?: Yes (appropriate for developmental age) Does the patient have difficulty walking or climbing stairs?: No Weakness of Legs: None Weakness of Arms/Hands: None  Permission Sought/Granted                  Emotional Assessment Appearance:: Appears stated age Attitude/Demeanor/Rapport: Engaged Affect (typically observed): Calm Orientation: : Oriented to Self, Oriented to Place, Oriented to  Time, Oriented to Situation Alcohol / Substance Use: Tobacco Use, Alcohol Use, Illicit Drugs (tobacco-current use,alcohol-current, hx of marijauna use) Psych Involvement: No (comment)  Admission diagnosis:  Back pain [M54.9] Malignant bone pain [G89.3, M89.8X9] Acute low back pain with bilateral sciatica, unspecified back pain laterality [M54.42, M54.41] Patient Active Problem List   Diagnosis Date Noted   Intractable low back pain 08/21/2022   L4 vertebral fracture ([place]) 08/21/2022   Back pain 08/20/2022   Dysphagia 03/13/2022   Hypoglycemia 03/13/2022   Esophageal stenosis 03/04/2022   COPD (chronic obstructive pulmonary disease) ([place])    Chronic anemia    Food impaction of esophagus 03/03/2022   COPD with acute exacerbation ([place]) 02/07/2022   Macrocytic anemia 02/06/2022   Esophageal dysphagia 02/02/2022   Macrocytosis 02/02/2022  Encounter for antineoplastic chemotherapy 01/18/2022   Esophageal stricture 12/25/2021   Insomnia 12/24/2021   DVT of upper extremity (deep vein thrombosis) ([place]) 12/20/2021   Hypophosphatemia 12/19/2021   Cancer related pain 12/19/2021   Tobacco abuse 12/19/2021   Sinus tachycardia 12/19/2021   Neutropenic fever ([place]) 12/18/2021   Acute on chronic respiratory failure  with hypoxia ([place]) 12/18/2021   Pneumonia 12/18/2021   Acute respiratory failure with hypoxia ([place]) 12/18/2021   Aspiration pneumonia ([place]) 12/18/2021   Radiation-induced esophagitis 12/18/2021   Chemotherapy induced nausea and vomiting 12/14/2021   Chest pain 12/14/2021   Hypomagnesemia 12/14/2021   Hypokalemia 12/14/2021   Hyponatremia 12/14/2021   Leukopenia due to antineoplastic chemotherapy ([place]) 12/14/2021   Normocytic anemia 12/14/2021   AKI (acute kidney injury) ([place]) 12/14/2021   Generalized weakness 12/14/2021   Dehydration 11/13/2021   Goals of care, counseling/discussion 09/28/2021   Malignant neoplasm of right upper lobe of lung ([place]) 09/05/2021   Status post lumbar microdiscectomy 06/24/2018   Left lumbar radiculitis 04/25/2017   Lumbar degenerative disc disease 04/25/2017   PCP:  [name], PA-C Pharmacy:   [facility] [phone] - [place], [place] - [phone] [place] [address] Phone: [phone] Fax: [phone]     Social Determinants of Health (SDOH) Interventions    Readmission Risk Interventions   Row Labels 02/06/2022    1:56 PM 12/17/2021    1:02 PM  Readmission Risk Prevention Plan   Section Header. No data exists in this row.    Transportation Screening   Complete Complete  HRI or Home Care Consult    Complete  Social Work Consult for [place] Planning/Counseling    Complete  Palliative Care Screening    Not Applicable  Medication Review Press photographer)   Complete Complete  PCP or Specialist appointment within 3-5 days of discharge   Complete   HRI or [place]   Complete   SW Recovery Care/Counseling Consult   Complete   Palliative Care Screening   Complete   Skilled Nursing Facility   Complete

## 2022-08-21 NOTE — Evaluation (Signed)
Physical Therapy Evaluation Patient Details Name: [name] MRN: [id number] DOB: Dec 29, 1970 Today's Date: 08/21/2022  History of Present Illness  51 y.o. female with past medical history significant for metastatic small cell lung cancer, chronic tobacco abuse, R UE DVT, COPD who is admitted to [facility] on 08/20/2022 for additional pain control in the setting of intractable low back pain.  Imaging and additional notable ED work-up: CAT scan of the thoracic and lumbar spine showed no acute findings of the thoracic spine, will demonstrating stable right-sided inferior endplate defect left T5; new superior endplate fracture of L4 without obvious underlying pathologic lesion; left paracentral disc protrusion at L2-L3, possibly impinging on the left L3 nerve.  Additionally, CT abdomen/pelvis with contrast showed unchanged right supraclavicular node, multiple pulmonary nodules, also appear unchanged relative to most recent set of imaging; numerous lesions throughout the liver, similar to most recent prior CT; interval increase in size of subcentimeter left periaortic lymph node, concerning for further metastatic disease.  Clinical Impression  Patient evaluated by Physical Therapy with no further acute PT needs identified. All education has been completed and the patient has no further questions.  Pt able to ambulate 350 feet in hallway without assistive device.  Pt denies mobility concerns however did report dyspnea end of ambulation.  SPO2 88-89% on room air upon returning to room and quickly improved to 93% with rest (RN notified). Pt denies PT needs upon d/c. Pt encouraged to mobilize with staff and/or mobility specialist during acute admission. PT is signing off. Thank you for this referral.        Recommendations for follow up therapy are one component of a multi-disciplinary discharge planning process, led by the attending physician.  Recommendations may be updated based on patient status,  additional functional criteria and insurance authorization.  Follow Up Recommendations No PT follow up      Assistance Recommended at Discharge PRN  Patient can return home with the following       Equipment Recommendations None recommended by PT  Recommendations for Other Services       Functional Status Assessment Patient has not had a recent decline in their functional status     Precautions / Restrictions Precautions Precautions: Back Precaution Comments: back for comfort, educated on log roll technique      Mobility  Bed Mobility Overal bed mobility: Modified Independent             General bed mobility comments: aware of log roll technique however elevated HOB on arrival    Transfers Overall transfer level: Needs assistance Equipment used: None Transfers: Sit to/from Stand Sit to Stand: Supervision                Ambulation/Gait Ambulation/Gait assistance: Supervision, Min guard Gait Distance (Feet): 350 Feet Assistive device: None Gait Pattern/deviations: Step-through pattern, Decreased stride length Gait velocity: decr     General Gait Details: occasionally grazing hand rail however no UE support required, SPO2 88-89% on room air upon returning to room with pt reporting dyspnea, improved quickly to 93% with rest  Stairs            Wheelchair Mobility    Modified Rankin (Stroke Patients Only)       Balance Overall balance assessment: No apparent balance deficits (not formally assessed)  Pertinent Vitals/Pain Pain Assessment Pain Assessment: 0-10 Pain Score: 4  Pain Location: back Pain Descriptors / Indicators: Discomfort Pain Intervention(s): Repositioned, Monitored during session, Premedicated before session    [place] expects to be discharged to:: Private residence Living Arrangements: Parent Available Help at Discharge: Family Type of Home:  House Home Access: Stairs to enter   Technical brewer of Steps: 3 on the side, 1 in the front   Home Layout: One level Home Equipment: None      Prior Function Prior Level of Function : Independent/Modified Independent                     Hand Dominance        Extremity/Trunk Assessment   Upper Extremity Assessment Upper Extremity Assessment: Overall WFL for tasks assessed    Lower Extremity Assessment Lower Extremity Assessment: Overall WFL for tasks assessed       Communication   Communication: No difficulties  Cognition Arousal/Alertness: Awake/alert Behavior During Therapy: WFL for tasks assessed/performed Overall Cognitive Status: Within Functional Limits for tasks assessed                                          General Comments      Exercises     Assessment/Plan    PT Assessment Patient does not need any further PT services  PT Problem List         PT Treatment Interventions      PT Goals (Current goals can be found in the Care Plan section)  Acute Rehab PT Goals PT Goal Formulation: All assessment and education complete, DC therapy    Frequency       Co-evaluation               AM-PAC PT "6 Clicks" Mobility  Outcome Measure Help needed turning from your back to your side while in a flat bed without using bedrails?: None Help needed moving from lying on your back to sitting on the side of a flat bed without using bedrails?: None Help needed moving to and from a bed to a chair (including a wheelchair)?: None Help needed standing up from a chair using your arms (e.g., wheelchair or bedside chair)?: None Help needed to walk in hospital room?: None Help needed climbing 3-5 steps with a railing? : A Little 6 Click Score: 23    End of Session Equipment Utilized During Treatment: Gait belt Activity Tolerance: Patient tolerated treatment well Patient left: with call bell/phone within reach;in bed;with  family/visitor present Nurse Communication: Mobility status PT Visit Diagnosis: Difficulty in walking, not elsewhere classified (R26.2)    Time: 1050-1101 PT Time Calculation (min) (ACUTE ONLY): 11 min   Charges:   PT Evaluation $PT Eval Low Complexity: 1 Low         Kati PT, DPT Physical Therapist Acute Rehabilitation Services Preferred contact method: Secure Chat Weekend Pager Only: [phone] Office: [phone]   Myrtis Hopping [place] 08/21/2022, 3:47 PM

## 2022-08-21 NOTE — Progress Notes (Addendum)
PROGRESS NOTE    SHASHA BUCHBINDER  FGH:[id number] DOB: 1971/05/20 DOA: 08/20/2022 PCP: [name], PA-C    Chief Complaint  Patient presents with   Back Pain    Brief Narrative:  Patient is unfortunate 51 year old female history of metastatic small cell lung cancer, chronic tobacco use, admitted to [facility] ED 08/20/2022 via [place] [facility] for pain management in the setting of intractable low back pain.  Patient denied any focal numbness or paresthesias including no report of saddle anesthesia, no bowel or urinary incontinence, no preceding trauma noted.  Patient noted to follow-up with Dr. Curt Bears of oncology who was considering palliative radiation for additional metastatic disease as well as ongoing chemotherapy. Patient seen in the ED on [place], imaging done while CT T and L-spine with no acute findings of thoracic spine, stable right-sided inferior endplate defect left T4, new superior endplate fracture of L4 without obvious underlying pathologic lesion, left paracentral disc protrusion at L2-L3, possibly impinging on the left L3 nerve.  CT abdomen and pelvis showed unchanged right supraclavicular node, multiple pulmonary nodules appear unchanged relative to most recent set of imaging, numerous lesions throughout the liver similar to most recent prior CT, interval increase in size of subcentimeter left periaortic lymph node concerning for further metastatic disease.  Patient admitted for pain management.  MRI of the L-spine also ordered for further evaluation.  Patient and mother noted that patient no longer desires any further chemotherapy or radiation treatment and at this time requesting palliative care evaluation for goals of care and symptom management.   Assessment & Plan:   Principal Problem:   Intractable low back pain Active Problems:   Malignant neoplasm of right upper lobe of lung (HCC)   Hypomagnesemia   Tobacco abuse   Back pain   L4 vertebral fracture  (HCC)   Malignant bone pain   #1 intractable acute low back pain -Patient with new onset low back pain occurring over the past 2 to 3 days with severely limiting patient's ambulation due to suboptimal pain control.  Patient with no significant lower extremity weakness, no evidence of saddle anesthesia, no bowel or urinary incontinence. -CT T and L-spine with new superior endplate fracture of L4, as well as left paracentral disc protrusion at L2/L3 with potential impingement on the left L3 nerve.  CT with no evidence of contributing underlying pathologic lesion including no overt evidence of contribution from metastatic fracture. -MRI of the L-spine done this morning with innumerable contrast-enhancing lesions throughout the visualized lumbar spine, sacrum, bilateral iliac bones consistent with osseous metastatic disease.  Small amount of contrast-enhancing material within the ventral epidural space at L2-L3 level on the left could represent epidural spread of tumor.  Redemonstrated mild anterior superior endplate compression deformity at L4 level redemonstrated mild anterior superior endplate compression deformity at L4 level, unchanged compared to recent prior CT chest abdomen and pelvis.  Asymmetric contrast-enhancement at the right L3-L4 facet joint may be reactive secondary to degenerative changes. -Change IV Dilaudid to 1 mg every 3 hours as needed for breakthrough pain. -Resume home regimen Percocet. -Placed on Lidoderm patch. -If no significant improvement may need long-acting pain medication with OxyContin twice daily in addition. -Palliative care consultation for symptom management, goals of care.  2.  Hypomagnesemia -Magnesium on admission noted at 1.6. -Patient having 2 g of IV magnesium and [facility] [facility] ED, repeat magnesium at 1.8. -We will give magnesium sulfate 2 g IV x1 and repeat labs in the AM.  3.  Metastatic small cell lung cancer with known mets to various lymph nodes,  metastatic disease to the liver. -Patient follows with Dr. Curt Bears in the outpatient setting, Dr. Lorna Few informed of admission via epic. -Per mother and patient patient does not want any further chemotherapy or radiation treatment and as such we will consult with palliative care for goals of care and symptom management.  4.  Chronic tobacco use -Tobacco cessation. -Placed on a nicotine patch.  5.  Anemia -Likely anemia of chronic disease. -Patient with no overt bleeding. -Check an anemia panel. -Follow H&H. -Transfer threshold hemoglobin < 7.       DVT prophylaxis: Lovenox Code Status: Full Family Communication: Updated patient, friend, mother at bedside. Disposition: TBD.  Status is: Inpatient The patient will require care spanning > 2 midnights and should be moved to inpatient because: Severity of illness, requiring IV pain medication.   Consultants:  Palliative care pending Oncology: Dr. Lorna Few informed of admission via epic.  Procedures:  CT chest abdomen and pelvis 08/20/2022 MRI L-spine 08/21/2022 CT L-spine 08/20/2022 CT T-spine 08/20/2022  Antimicrobials:  None   Subjective: Patient laying in bed.  Still complaining of low back pain.  Denies any perineal numbness.  No urinary or bowel incontinence.  States pain medication does not last long enough.  Tolerating current diet.  Friend at bedside.  Mother at bedside stating patient does not want any more chemo or radiation treatment as of August 2023.  Objective: Vitals:   08/21/22 0359 08/21/22 0412 08/21/22 0956 08/21/22 1408  BP: 114/85  101/79 104/84  Pulse: (!) 104  95 85  Resp: 18  19 18   Temp: 98.8 F (37.1 C)  97.8 F (36.6 C) (!) 97.5 F (36.4 C)  TempSrc: Oral  Oral Oral  SpO2: 95%  93% 97%  Weight:  60.9 kg      Intake/Output Summary (Last 24 hours) at 08/21/2022 1554 Last data filed at 08/21/2022 1502 Gross per 24 hour  Intake 412.35 ml  Output --  Net 412.35 ml    Filed Weights   08/21/22 0412  Weight: 60.9 kg    Examination:  General exam: Appears calm and comfortable  Respiratory system: Clear to auscultation. Respiratory effort normal. Cardiovascular system: S1 & S2 heard, RRR. No JVD, murmurs, rubs, gallops or clicks. No pedal edema. Gastrointestinal system: Abdomen is nondistended, soft and nontender. No organomegaly or masses felt. Normal bowel sounds heard. Central nervous system: Alert and oriented. No focal neurological deficits. Extremities: Symmetric 5 x 5 power. Skin: No rashes, lesions or ulcers Psychiatry: Judgement and insight appear normal. Mood & affect appropriate.     Data Reviewed: I have personally reviewed following labs and imaging studies  CBC: Recent Labs  Lab 08/16/22 0936 08/20/22 1305 08/21/22 0551  WBC 8.5 8.9 6.2  NEUTROABS  --  7.0 4.4  HGB 15.1* 13.6 11.7*  HCT 45.1 41.1 35.3*  MCV 108.2* 107.9* 106.0*  PLT 262 214 272    Basic Metabolic Panel: Recent Labs  Lab 08/16/22 0936 08/20/22 1305 08/21/22 0551  NA 136 136 135  K 3.6 3.8 3.5  CL 107 107 106  CO2 18* 18* 22  GLUCOSE 93 82 105*  BUN 15 13 13   CREATININE 1.33* 1.12* 0.94  CALCIUM 10.7* 10.1 9.0  MG  --  1.6* 1.8    GFR: Estimated Creatinine Clearance: 68.8 mL/min (by C-G formula based on SCr of 0.94 mg/dL).  Liver Function Tests: Recent Labs  Lab  08/16/22 0936 08/20/22 1305 08/21/22 0551  AST 30 31 39  ALT 22 14 20   ALKPHOS 132* 144* 138*  BILITOT 0.4 0.3 0.3  PROT 9.7* 8.1 7.0  ALBUMIN 4.7 4.2 3.3*    CBG: No results for input(s): "GLUCAP" in the last 168 hours.   No results found for this or any previous visit (from the past 240 hour(s)).       Radiology Studies: MR Lumbar Spine W Wo Contrast  Result Date: 08/21/2022 CLINICAL DATA:  History of metastatic small cell lung cancer who presents with acute intractable lower back pain. CT lumbar spine shows a new superior endplate fracture at L4. EXAM: MRI  LUMBAR SPINE WITHOUT AND WITH CONTRAST TECHNIQUE: Multiplanar and multiecho pulse sequences of the lumbar spine were obtained without and with intravenous contrast. CONTRAST:  6 ml Vueway COMPARISON:  CT CAP 08/20/22, CT T/L Spine 08/20/22 FINDINGS: Segmentation:  Standard. Alignment:  Physiologic. Vertebrae: There are innumerable T1 hypointense, T2 hyperintense, contrast-enhancing lesions throughout the visualized lumbar spine, sacrum, and the bilateral iliac bones. Redemonstrated is a mild anterior superior endplate compression deformity at the L4 level, unchanged compared to recent prior CT chest abdomen and pelvis. Conus medullaris and cauda equina: Conus extends to the L1 level. Conus and cauda equina appear normal. Paraspinal and other soft tissues: Please see previously dictated CT chest abdomen and pelvis for intra-findings, including multifocal contrast-enhancing hepatic lesions. Disc levels: T11-T12: Only imaged in the sagittal plane. No evidence of spinal canal or neural foraminal stenosis. T12-L1: Small central disc protrusion. No spinal canal or neural foraminal stenosis. L1-L2: Mild bilateral facet degenerative change. No evidence of spinal canal or neural foraminal stenosis. L2-L3: Within the subarticular space on the left there is a small amount of contrast-enhancing soft tissue (series 11, image 17) that narrows the left lateral recess. Mild left neural foraminal narrowing. No right neural foraminal narrowing. No overall spinal canal stenosis. L3-L4: Circumferential disc bulge. No spinal canal stenosis. No neural foraminal stenosis. There is asymmetric contrast enhancement at the right L3-L4 facet joint (series 10, image 5). L4-L5: Moderate bilateral facet degenerative change. Circumferential disc bulge. No overall spinal canal stenosis. Mild bilateral neural foraminal stenosis. L5-S1: Small circumferential disc bulge. Mild bilateral facet degenerative change. No neural foraminal stenosis. No spinal  canal stenosis. IMPRESSION: 1. Innumerable contrast-enhancing lesions throughout the visualized lumbar spine, sacrum, and bilateral iliac bones, consistent with osseous metastatic disease. There is a small amount of contrast-enhancing material within the ventral epidural space at the L2-L3 level on the left, which could represent epidural spread of tumor. 2. Redemonstrated mild anterior superior endplate compression deformity at the L4 level, unchanged compared to recent prior CT chest abdomen and pelvis. 3. Asymmetric contrast enhanacement at the right L3-L4 facet joint. This may be reactive secondary to degenerative changes. 4. Please see previously dictated CT chest abdomen and pelvis for intra-abdominal findings, including multifocal contrast-enhancing hepatic lesions. Electronically Signed   By: Marin Roberts M.D.   On: 08/21/2022 10:05   CT CHEST ABDOMEN PELVIS W CONTRAST  Result Date: 08/20/2022 CLINICAL DATA:  Metastatic disease evaluation; * Tracking Code: BO * EXAM: CT CHEST, ABDOMEN, AND PELVIS WITH CONTRAST TECHNIQUE: Multidetector CT imaging of the chest, abdomen and pelvis was performed following the standard protocol during bolus administration of intravenous contrast. RADIATION DOSE REDUCTION: This exam was performed according to the departmental dose-optimization program which includes automated exposure control, adjustment of the mA and/or kV according to patient size and/or use of iterative reconstruction technique.  CONTRAST:  47mL OMNIPAQUE IOHEXOL 300 MG/ML  SOLN COMPARISON:  Multiple priors, most recent chest CT dated August 16, 2022 and abdomen and pelvis CT dated Mar 25, 2022 FINDINGS: CT CHEST FINDINGS Cardiovascular: Normal heart size. Trace pericardial effusion. Normal caliber thoracic aorta with mild atherosclerotic disease. No suspicious filling defects of the central pulmonary arteries. Mediastinum/Nodes: Esophagus and thyroid are unremarkable. Right supraclavicular lymph node  measuring 4.3 x 3.4 cm, unchanged when compared with recent prior chest CT, increased in size when compared with more remote priors. Mildly enlarged right hilar lymph node measuring 1.1 cm in short axis on series 2, image 27 and mild asymmetric right hilar soft tissue, unchanged compared with multiple priors. Lungs/Pleura: Central airways are patent. Severe centrilobular emphysema. Debris seen in the lower lobe bronchi, likely due to aspiration. Reference irregular right upper lobe pulmonary nodule measuring approximately 1.4 x 0.9 cm on series 4 image 51, unchanged when compared with multiple prior exams. Few small scattered pulmonary nodules are seen right middle lobe which are unchanged when compared with recent prior, but when compared with July 01, 2022 prior. Nodule measuring 4 mm on series 4, image 85. Reference perifissural nodule measuring 4 mm on series 4, image 76. In the no pleural effusion or pneumothorax. Musculoskeletal: Subacute or chronic appearing fractures of the anterior 5th and 6th and right ribs. Previously described proximal right humerus lesion is not included in the field of view. CT ABDOMEN PELVIS FINDINGS Hepatobiliary: Innumerable lesions are seen throughout the liver which are similar to most recent prior chest CT, but new when compared with more remote priors., index lesion of the left lobe of the liver measuring 3.1 by 0.0 cm on series 2, image 57 and index lesion of the right lobe of the liver measuring 1.9 x 1.6 cm on series 2, image 77. Gallbladder is unremarkable. No biliary ductal dilation. Pancreas: Unremarkable. No pancreatic ductal dilatation or surrounding inflammatory changes. Spleen: Normal in size without focal abnormality. Adrenals/Urinary Tract: Adrenal glands are unremarkable. Kidneys are normal, without renal calculi, focal lesion, or hydronephrosis. Bladder is unremarkable. Stomach/Bowel: Stomach is within normal limits. No evidence of bowel wall thickening,  distention, or inflammatory changes. Vascular/Lymphatic: Aortic atherosclerosis. Prominent subcentimeter left periaortic lymph node measuring 9 mm on series 2, image 78, previously 3 mm. Reproductive: Uterus and bilateral adnexa are unremarkable. Other: No abdominal wall hernia or abnormality. No abdominopelvic ascites. Musculoskeletal: New mild endplate compression deformity of L5. No aggressive appearing osseous lesions. IMPRESSION: 1. Bulky right supraclavicular node unchanged when compared with recent prior chest CT, but increased in size when compared with more remote priors. 2. Irregular right upper lobe pulmonary nodule and mildly enlarged right hilar lymph node, unchanged compared with multiple priors and likely posttreatment changes. 3. Small solid pulmonary nodules are seen in the right middle lobe which are unchanged when compared with recent prior chest CT but new when compared with more remote priors, possibly to infection or aspiration, although metastatic disease also a concern. 4. Scattered debris seen in the right lower lobe bronchi, compatible with aspiration. 5. Innumerable lesions are seen throughout the liver which are similar to recent chest CT, but new when compared with more remote priors. 6. Interval increased size of subcentimeter left para-aortic lymph node compared with first 2023 abdomen and pelvis CT, concerning for metastatic disease. 7. Lesion of the right proximal humerus which was described on recent prior chest CT is not included in the field of view. 8. Aortic Atherosclerosis (ICD10-I70.0) and Emphysema (ICD10-J43.9).  Electronically Signed   By: Yetta Glassman M.D.   On: 08/20/2022 15:27   CT L-SPINE NO CHARGE  Result Date: 08/20/2022 CLINICAL DATA:  Metastatic cancer.  Back pain. EXAM: CT THORACIC AND LUMBAR SPINE WITHOUT CONTRAST TECHNIQUE: Multidetector CT imaging of the thoracic and lumbar spine was performed without contrast. Multiplanar CT image reconstructions were  also generated. RADIATION DOSE REDUCTION: This exam was performed according to the departmental dose-optimization program which includes automated exposure control, adjustment of the mA and/or kV according to patient size and/or use of iterative reconstruction technique. COMPARISON:  Chest CT 08/16/2022 and CT chest, abdomen/pelvis from today. FINDINGS: CT THORACIC SPINE FINDINGS Alignment: Normal Vertebrae: Stable right-sided inferior endplate defect of T5. No acute fracture. No lytic destructive bone lesion is identified. Paraspinal and other soft tissues: 5 cm right supraclavicular nodal mass is noted. Stable advanced emphysematous changes are noted in the lungs along with scattered pulmonary nodules. No pleural effusions. Diffuse hepatic metastatic disease is again noted. Disc levels: No large thoracic disc protrusions, spinal or foraminal stenosis. CT LUMBAR SPINE FINDINGS Segmentation: There are five lumbar type vertebral bodies. The last full intervertebral disc space is labeled L5-S1. Alignment: Normal Vertebrae: New superior endplate fracture of L4 without obvious underlying pathologic lesion. No other lumbar fractures are identified. The facets are normally aligned. No pars defects. The visualized bony pelvis is intact. No obvious destructive lesions. Paraspinal and other soft tissues: Diffuse hepatic metastatic disease is demonstrated. Age advanced aortic and iliac artery calcifications but no aneurysm. No retroperitoneal adenopathy. Disc levels: L1-2: No significant findings. L2-3: Left paracentral disc protrusion with mild mass effect on the left side of the thecal sac possibly impinging on the left L3 nerve root in the lateral recess. Mild foraminal encroachment also. L3-4: Shallow broad-based right foraminal and extraforaminal disc protrusion potentially irritating the right L3 nerve root. There is also mild right lateral recess stenosis which could affect the right L4 nerve root. L4-5: Mild diffuse  annular bulge and moderate facet disease but no disc protrusion or foraminal stenosis. Suspect left-sided laminotomy. L5-S1: Diffuse annular bulge asymmetric right but no significant neural compression. IMPRESSION: 1. Stable right-sided inferior endplate defect of T5. No acute or significant findings in the thoracic spine. 2. Diffuse hepatic metastatic disease. 3. New superior endplate fracture of L4 without obvious underlying pathologic lesion. 4. Left paracentral disc protrusion at L2-3 possibly impinging on the left L3 nerve root in the lateral recess. 5. Shallow broad-based right foraminal and extraforaminal disc protrusion at L3-4 potentially irritating the right L3 nerve root in the lateral recess. 6. Bulging discs at L4-5 and L5-S1. Aortic Atherosclerosis (ICD10-I70.0). Electronically Signed   By: Marijo Sanes M.D.   On: 08/20/2022 15:06   CT T-SPINE NO CHARGE  Result Date: 08/20/2022 CLINICAL DATA:  Metastatic cancer.  Back pain. EXAM: CT THORACIC AND LUMBAR SPINE WITHOUT CONTRAST TECHNIQUE: Multidetector CT imaging of the thoracic and lumbar spine was performed without contrast. Multiplanar CT image reconstructions were also generated. RADIATION DOSE REDUCTION: This exam was performed according to the departmental dose-optimization program which includes automated exposure control, adjustment of the mA and/or kV according to patient size and/or use of iterative reconstruction technique. COMPARISON:  Chest CT 08/16/2022 and CT chest, abdomen/pelvis from today. FINDINGS: CT THORACIC SPINE FINDINGS Alignment: Normal Vertebrae: Stable right-sided inferior endplate defect of T5. No acute fracture. No lytic destructive bone lesion is identified. Paraspinal and other soft tissues: 5 cm right supraclavicular nodal mass is noted. Stable advanced emphysematous changes are  noted in the lungs along with scattered pulmonary nodules. No pleural effusions. Diffuse hepatic metastatic disease is again noted. Disc  levels: No large thoracic disc protrusions, spinal or foraminal stenosis. CT LUMBAR SPINE FINDINGS Segmentation: There are five lumbar type vertebral bodies. The last full intervertebral disc space is labeled L5-S1. Alignment: Normal Vertebrae: New superior endplate fracture of L4 without obvious underlying pathologic lesion. No other lumbar fractures are identified. The facets are normally aligned. No pars defects. The visualized bony pelvis is intact. No obvious destructive lesions. Paraspinal and other soft tissues: Diffuse hepatic metastatic disease is demonstrated. Age advanced aortic and iliac artery calcifications but no aneurysm. No retroperitoneal adenopathy. Disc levels: L1-2: No significant findings. L2-3: Left paracentral disc protrusion with mild mass effect on the left side of the thecal sac possibly impinging on the left L3 nerve root in the lateral recess. Mild foraminal encroachment also. L3-4: Shallow broad-based right foraminal and extraforaminal disc protrusion potentially irritating the right L3 nerve root. There is also mild right lateral recess stenosis which could affect the right L4 nerve root. L4-5: Mild diffuse annular bulge and moderate facet disease but no disc protrusion or foraminal stenosis. Suspect left-sided laminotomy. L5-S1: Diffuse annular bulge asymmetric right but no significant neural compression. IMPRESSION: 1. Stable right-sided inferior endplate defect of T5. No acute or significant findings in the thoracic spine. 2. Diffuse hepatic metastatic disease. 3. New superior endplate fracture of L4 without obvious underlying pathologic lesion. 4. Left paracentral disc protrusion at L2-3 possibly impinging on the left L3 nerve root in the lateral recess. 5. Shallow broad-based right foraminal and extraforaminal disc protrusion at L3-4 potentially irritating the right L3 nerve root in the lateral recess. 6. Bulging discs at L4-5 and L5-S1. Aortic Atherosclerosis (ICD10-I70.0).  Electronically Signed   By: Marijo Sanes M.D.   On: 08/20/2022 15:06        Scheduled Meds:  lidocaine  1 patch Transdermal Q24H   senna-docusate  1 tablet Oral BID   Continuous Infusions:   LOS: 0 days    Time spent: 40 minutes    Irine Seal, MD [facility]   To contact the attending provider between 7A-7P or the covering provider during after hours 7P-7A, please log into the web site [email or web address] and access using universal [place] password for that web site. If you do not have the password, please call the hospital operator.  08/21/2022, 3:54 PM

## 2022-08-22 LAB — BASIC METABOLIC PANEL
Anion gap: 8 (ref 5–15)
BUN: 16 mg/dL (ref 6–20)
CO2: 28 mmol/L (ref 22–32)
Calcium: 9.8 mg/dL (ref 8.9–10.3)
Chloride: 104 mmol/L (ref 98–111)
Creatinine, Ser: 0.92 mg/dL (ref 0.44–1.00)
GFR, Estimated: >60 mL/min (ref >60)
Glucose, Bld: 91 mg/dL (ref 70–99)
Potassium: 3.2 mmol/L — ABNORMAL LOW (ref 3.5–5.1)
Sodium: 140 mmol/L (ref 135–145)

## 2022-08-22 LAB — CBC
HCT: 37.1 % (ref 36.0–46.0)
Hemoglobin: 12.2 g/dL (ref 12.0–15.0)
MCH: 35.3 pg — ABNORMAL HIGH (ref 26.0–34.0)
MCHC: 32.9 g/dL (ref 30.0–36.0)
MCV: 107.2 fL — ABNORMAL HIGH (ref 80.0–100.0)
Platelets: 218 10*3/uL (ref 150–400)
RBC: 3.46 MIL/uL — ABNORMAL LOW (ref 3.87–5.11)
RDW: 13.3 % (ref 11.5–15.5)
WBC: 7.5 10*3/uL (ref 4.0–10.5)
nRBC: 0 % (ref 0.0–0.2)

## 2022-08-22 LAB — RETICULOCYTES
Immature Retic Fract: 16 % — ABNORMAL HIGH (ref 2.3–15.9)
RBC.: 3.47 MIL/uL — ABNORMAL LOW (ref 3.87–5.11)
Retic Count, Absolute: 32.3 10*3/uL (ref 19.0–186.0)
Retic Ct Pct: 0.9 % (ref 0.4–3.1)

## 2022-08-22 LAB — FOLATE: Folate: 4.3 ng/mL — ABNORMAL LOW (ref >5.9)

## 2022-08-22 LAB — FERRITIN: Ferritin: 1954 ng/mL — ABNORMAL HIGH (ref 11–307)

## 2022-08-22 LAB — IRON AND TIBC
Iron: 51 ug/dL (ref 28–170)
Saturation Ratios: 20 % (ref 10.4–31.8)
TIBC: 258 ug/dL (ref 250–450)
UIBC: 207 ug/dL

## 2022-08-22 LAB — MAGNESIUM: Magnesium: 2.1 mg/dL (ref 1.7–2.4)

## 2022-08-22 LAB — VITAMIN B12: Vitamin B-12: 488 pg/mL (ref 180–914)

## 2022-08-22 MED ORDER — HYDROMORPHONE HCL 1 MG/ML IJ SOLN
1.0000 mg | INTRAMUSCULAR | Status: DC | PRN
  Administered 2022-08-22: 1 mg via INTRAVENOUS
  Administered 2022-08-22: 2 mg via INTRAVENOUS
  Filled 2022-08-22: qty 2

## 2022-08-22 MED ORDER — FOLIC ACID 1 MG PO TABS
1.0000 mg | ORAL_TABLET | Freq: Every day | ORAL | Status: DC
  Administered 2022-08-22 – 2022-08-23 (×2): 1 mg via ORAL
  Filled 2022-08-22 (×2): qty 1

## 2022-08-22 MED ORDER — POTASSIUM CHLORIDE CRYS ER 20 MEQ PO TBCR
40.0000 meq | EXTENDED_RELEASE_TABLET | Freq: Once | ORAL | Status: AC
  Administered 2022-08-22: 40 meq via ORAL
  Filled 2022-08-22: qty 2

## 2022-08-22 MED ORDER — DIPHENHYDRAMINE HCL 50 MG/ML IJ SOLN: 12.5000 mg | Freq: Four times a day (QID) | INTRAMUSCULAR | Status: DC | PRN

## 2022-08-22 MED ORDER — KETOROLAC TROMETHAMINE 30 MG/ML IJ SOLN
30.0000 mg | Freq: Four times a day (QID) | INTRAMUSCULAR | Status: DC
  Administered 2022-08-23 (×3): 30 mg via INTRAVENOUS
  Filled 2022-08-22 (×3): qty 1

## 2022-08-22 MED ORDER — HYDROMORPHONE 1 MG/ML IV SOLN
INTRAVENOUS | Status: DC
  Administered 2022-08-23: 2 mg via INTRAVENOUS
  Administered 2022-08-23: 2.5 mg via INTRAVENOUS
  Administered 2022-08-23: 0.5 mg via INTRAVENOUS
  Administered 2022-08-23: 1 mg via INTRAVENOUS
  Filled 2022-08-22: qty 30

## 2022-08-22 MED ORDER — SODIUM CHLORIDE 0.9% FLUSH: 9.0000 mL | INTRAVENOUS | Status: DC | PRN

## 2022-08-22 MED ORDER — OXYCODONE HCL ER 15 MG PO T12A
15.0000 mg | EXTENDED_RELEASE_TABLET | Freq: Two times a day (BID) | ORAL | Status: DC
  Administered 2022-08-22 – 2022-08-23 (×3): 15 mg via ORAL
  Filled 2022-08-22 (×3): qty 1

## 2022-08-22 MED ORDER — DIPHENHYDRAMINE HCL 12.5 MG/5ML PO ELIX: 12.5000 mg | ORAL_SOLUTION | Freq: Four times a day (QID) | ORAL | Status: DC | PRN

## 2022-08-22 MED ORDER — GABAPENTIN 300 MG PO CAPS
300.0000 mg | ORAL_CAPSULE | Freq: Two times a day (BID) | ORAL | Status: DC
  Administered 2022-08-22 – 2022-08-23 (×2): 300 mg via ORAL
  Filled 2022-08-22 (×2): qty 1

## 2022-08-22 MED ORDER — OXYCODONE-ACETAMINOPHEN 5-325 MG PO TABS
1.0000 | ORAL_TABLET | ORAL | Status: DC | PRN
  Administered 2022-08-22: 2 via ORAL
  Filled 2022-08-22: qty 2

## 2022-08-22 MED ORDER — NALOXONE HCL 0.4 MG/ML IJ SOLN: 0.4000 mg | INTRAMUSCULAR | Status: DC | PRN

## 2022-08-22 NOTE — Progress Notes (Addendum)
PROGRESS NOTE    [name]  SJG:[id number] DOB: 1971-03-23 DOA: 08/20/2022 PCP: [name], PA-C    Chief Complaint  Patient presents with   Back Pain    Brief Narrative:  Patient is unfortunate 51 year old female history of metastatic small cell lung cancer, chronic tobacco use, admitted to [facility] ED 08/20/2022 via [place] [facility] for pain management in the setting of intractable low back pain.  Patient denied any focal numbness or paresthesias including no report of saddle anesthesia, no bowel or urinary incontinence, no preceding trauma noted.  Patient noted to follow-up with Dr. Curt Bears of oncology who was considering palliative radiation for additional metastatic disease as well as ongoing chemotherapy. Patient seen in the ED on [place], imaging done while CT T and L-spine with no acute findings of thoracic spine, stable right-sided inferior endplate defect left T4, new superior endplate fracture of L4 without obvious underlying pathologic lesion, left paracentral disc protrusion at L2-L3, possibly impinging on the left L3 nerve.  CT abdomen and pelvis showed unchanged right supraclavicular node, multiple pulmonary nodules appear unchanged relative to most recent set of imaging, numerous lesions throughout the liver similar to most recent prior CT, interval increase in size of subcentimeter left periaortic lymph node concerning for further metastatic disease.  Patient admitted for pain management.  MRI of the L-spine also ordered for further evaluation.  Patient and mother noted that patient no longer desires any further chemotherapy or radiation treatment and at this time requesting palliative care evaluation for goals of care and symptom management.   Assessment & Plan:   Principal Problem:   Intractable low back pain Active Problems:   Malignant neoplasm of right upper lobe of lung (HCC)   Hypomagnesemia   Tobacco abuse   Back pain   L4 vertebral fracture  (HCC)   Malignant bone pain   #1 intractable acute low back pain -Patient with new onset low back pain occurring over the past 2 to 3 days with severely limiting patient's ambulation due to suboptimal pain control.  Patient with no significant lower extremity weakness, no evidence of saddle anesthesia, no bowel or urinary incontinence. -CT T and L-spine with new superior endplate fracture of L4, as well as left paracentral disc protrusion at L2/L3 with potential impingement on the left L3 nerve.  CT with no evidence of contributing underlying pathologic lesion including no overt evidence of contribution from metastatic fracture. -MRI of the L-spine done this morning with innumerable contrast-enhancing lesions throughout the visualized lumbar spine, sacrum, bilateral iliac bones consistent with osseous metastatic disease.  Small amount of contrast-enhancing material within the ventral epidural space at L2-L3 level on the left could represent epidural spread of tumor.  Redemonstrated mild anterior superior endplate compression deformity at L4 level redemonstrated mild anterior superior endplate compression deformity at L4 level, unchanged compared to recent prior CT chest abdomen and pelvis.  Asymmetric contrast-enhancement at the right L3-L4 facet joint may be reactive secondary to degenerative changes. -Continue IV Dilaudid to 1 mg every 3 hours as needed for breakthrough pain. -Continue home regimen Percocet. -Placed on OxyContin 15 mg p.o. twice daily for long-acting. -Discontinue Lidoderm patch. -Palliative care consultation for symptom management, goals of care.  2.  Hypomagnesemia -Magnesium on admission noted at 1.6. -Patient s/p 2 g of IV magnesium at [place] ED, repeat magnesium this morning at 2.1.  3.  Metastatic small cell lung cancer with known mets to various lymph nodes, metastatic disease to the liver. -Patient  follows with Dr. Curt Bears in the outpatient setting, Dr.  Lorna Few informed of admission via epic. -Per mother and patient patient does not want any further chemotherapy or radiation treatment and as such palliative care has been consulted for goals of care and symptom management.   4.  Chronic tobacco use -Tobacco cessation. -Continue nicotine patch.  5.  Anemia -Likely anemia of chronic disease. -Patient with no overt bleeding. -Anemia panel with a folate level of 4.3, ferritin of 1954, iron of 51, TIBC of 258.   -Hemoglobin stable at 14.9. -Place on folic acid 1 mg daily.  -Follow H&H. -Transfer threshold hemoglobin < 7.  6.  Hypokalemia -Replete.       DVT prophylaxis: Lovenox Code Status: Full Family Communication: Updated patient, no family at bedside.   Disposition: Home once pain is adequately controlled hopefully in the next 1 to 2 days and patient has been assessed by palliative care.  Status is: Inpatient The patient will require care spanning > 2 midnights and should be moved to inpatient because: Severity of illness, requiring IV pain medication.   Consultants:  Palliative care pending Oncology: Dr. Lorna Few informed of admission via epic.  Procedures:  CT chest abdomen and pelvis 08/20/2022 MRI L-spine 08/21/2022 CT L-spine 08/20/2022 CT T-spine 08/20/2022  Antimicrobials:  None   Subjective: Sitting up in bed, somewhat tearful.  Asking what the MRI results were and results relayed to patient.  Denies any chest pain.  No shortness of breath.  No abdominal pain.  Still with complaints of low back pain which she states slightly a little bit better than on admission however still requiring IV Dilaudid every 3 hours.  Asking whether she can go outside for some fresh air.    Objective: Vitals:   08/21/22 0956 08/21/22 1408 08/21/22 2133 08/22/22 0544  BP: 101/79 104/84 (!) 110/96 95/84  Pulse: 95 85 (!) 103 100  Resp: 19 18 16 16   Temp: 97.8 F (36.6 C) (!) 97.5 F (36.4 C) 98.1 F (36.7 C)  (!) 97.4 F (36.3 C)  TempSrc: Oral Oral Oral Oral  SpO2: 93% 97% 90% 94%  Weight:    64.6 kg    Intake/Output Summary (Last 24 hours) at 08/22/2022 1138 Last data filed at 08/22/2022 0900 Gross per 24 hour  Intake 407.35 ml  Output --  Net 407.35 ml    Filed Weights   08/21/22 0412 08/22/22 0544  Weight: 60.9 kg 64.6 kg    Examination:  General exam: NAD Respiratory system: CTA B.  No wheezes, no crackles, no rhonchi.  Fair air movement.  Speaking in full sentences.  Cardiovascular system: Regular rate rhythm no murmurs rubs or gallops.  No JVD.  No lower extremity edema. Gastrointestinal system: Abdomen is soft, nontender, nondistended, positive bowel sounds.  No rebound.  No guarding.   Central nervous system: Alert and oriented. No focal neurological deficits. Extremities: Symmetric 5 x 5 power. Skin: No rashes, lesions or ulcers Psychiatry: Judgement and insight appear normal. Mood & affect appropriate.     Data Reviewed: I have personally reviewed following labs and imaging studies  CBC: Recent Labs  Lab 08/16/22 0936 08/20/22 1305 08/21/22 0551 08/22/22 0634  WBC 8.5 8.9 6.2 7.5  NEUTROABS  --  7.0 4.4  --   HGB 15.1* 13.6 11.7* 12.2  HCT 45.1 41.1 35.3* 37.1  MCV 108.2* 107.9* 106.0* 107.2*  PLT 262 214 208 218     Basic Metabolic Panel: Recent Labs  Lab  08/16/22 0936 08/20/22 1305 08/21/22 0551 08/22/22 0634  NA 136 136 135 140  K 3.6 3.8 3.5 3.2*  CL 107 107 106 104  CO2 18* 18* 22 28  GLUCOSE 93 82 105* 91  BUN 15 13 13 16   CREATININE 1.33* 1.12* 0.94 0.92  CALCIUM 10.7* 10.1 9.0 9.8  MG  --  1.6* 1.8 2.1     GFR: Estimated Creatinine Clearance: 73.8 mL/min (by C-G formula based on SCr of 0.92 mg/dL).  Liver Function Tests: Recent Labs  Lab 08/16/22 0936 08/20/22 1305 08/21/22 0551  AST 30 31 39  ALT 22 14 20   ALKPHOS 132* 144* 138*  BILITOT 0.4 0.3 0.3  PROT 9.7* 8.1 7.0  ALBUMIN 4.7 4.2 3.3*     CBG: No results for  input(s): "GLUCAP" in the last 168 hours.   No results found for this or any previous visit (from the past 240 hour(s)).       Radiology Studies: MR Lumbar Spine W Wo Contrast  Result Date: 08/21/2022 CLINICAL DATA:  History of metastatic small cell lung cancer who presents with acute intractable lower back pain. CT lumbar spine shows a new superior endplate fracture at L4. EXAM: MRI LUMBAR SPINE WITHOUT AND WITH CONTRAST TECHNIQUE: Multiplanar and multiecho pulse sequences of the lumbar spine were obtained without and with intravenous contrast. CONTRAST:  6 ml Vueway COMPARISON:  CT CAP 08/20/22, CT T/L Spine 08/20/22 FINDINGS: Segmentation:  Standard. Alignment:  Physiologic. Vertebrae: There are innumerable T1 hypointense, T2 hyperintense, contrast-enhancing lesions throughout the visualized lumbar spine, sacrum, and the bilateral iliac bones. Redemonstrated is a mild anterior superior endplate compression deformity at the L4 level, unchanged compared to recent prior CT chest abdomen and pelvis. Conus medullaris and cauda equina: Conus extends to the L1 level. Conus and cauda equina appear normal. Paraspinal and other soft tissues: Please see previously dictated CT chest abdomen and pelvis for intra-findings, including multifocal contrast-enhancing hepatic lesions. Disc levels: T11-T12: Only imaged in the sagittal plane. No evidence of spinal canal or neural foraminal stenosis. T12-L1: Small central disc protrusion. No spinal canal or neural foraminal stenosis. L1-L2: Mild bilateral facet degenerative change. No evidence of spinal canal or neural foraminal stenosis. L2-L3: Within the subarticular space on the left there is a small amount of contrast-enhancing soft tissue (series 11, image 17) that narrows the left lateral recess. Mild left neural foraminal narrowing. No right neural foraminal narrowing. No overall spinal canal stenosis. L3-L4: Circumferential disc bulge. No spinal canal stenosis. No  neural foraminal stenosis. There is asymmetric contrast enhancement at the right L3-L4 facet joint (series 10, image 5). L4-L5: Moderate bilateral facet degenerative change. Circumferential disc bulge. No overall spinal canal stenosis. Mild bilateral neural foraminal stenosis. L5-S1: Small circumferential disc bulge. Mild bilateral facet degenerative change. No neural foraminal stenosis. No spinal canal stenosis. IMPRESSION: 1. Innumerable contrast-enhancing lesions throughout the visualized lumbar spine, sacrum, and bilateral iliac bones, consistent with osseous metastatic disease. There is a small amount of contrast-enhancing material within the ventral epidural space at the L2-L3 level on the left, which could represent epidural spread of tumor. 2. Redemonstrated mild anterior superior endplate compression deformity at the L4 level, unchanged compared to recent prior CT chest abdomen and pelvis. 3. Asymmetric contrast enhanacement at the right L3-L4 facet joint. This may be reactive secondary to degenerative changes. 4. Please see previously dictated CT chest abdomen and pelvis for intra-abdominal findings, including multifocal contrast-enhancing hepatic lesions. Electronically Signed   By: Marin Olp.D.  On: 08/21/2022 10:05   CT CHEST ABDOMEN PELVIS W CONTRAST  Result Date: 08/20/2022 CLINICAL DATA:  Metastatic disease evaluation; * Tracking Code: BO * EXAM: CT CHEST, ABDOMEN, AND PELVIS WITH CONTRAST TECHNIQUE: Multidetector CT imaging of the chest, abdomen and pelvis was performed following the standard protocol during bolus administration of intravenous contrast. RADIATION DOSE REDUCTION: This exam was performed according to the departmental dose-optimization program which includes automated exposure control, adjustment of the mA and/or kV according to patient size and/or use of iterative reconstruction technique. CONTRAST:  49mL OMNIPAQUE IOHEXOL 300 MG/ML  SOLN COMPARISON:  Multiple priors, most  recent chest CT dated August 16, 2022 and abdomen and pelvis CT dated Mar 25, 2022 FINDINGS: CT CHEST FINDINGS Cardiovascular: Normal heart size. Trace pericardial effusion. Normal caliber thoracic aorta with mild atherosclerotic disease. No suspicious filling defects of the central pulmonary arteries. Mediastinum/Nodes: Esophagus and thyroid are unremarkable. Right supraclavicular lymph node measuring 4.3 x 3.4 cm, unchanged when compared with recent prior chest CT, increased in size when compared with more remote priors. Mildly enlarged right hilar lymph node measuring 1.1 cm in short axis on series 2, image 27 and mild asymmetric right hilar soft tissue, unchanged compared with multiple priors. Lungs/Pleura: Central airways are patent. Severe centrilobular emphysema. Debris seen in the lower lobe bronchi, likely due to aspiration. Reference irregular right upper lobe pulmonary nodule measuring approximately 1.4 x 0.9 cm on series 4 image 51, unchanged when compared with multiple prior exams. Few small scattered pulmonary nodules are seen right middle lobe which are unchanged when compared with recent prior, but when compared with July 01, 2022 prior. Nodule measuring 4 mm on series 4, image 85. Reference perifissural nodule measuring 4 mm on series 4, image 76. In the no pleural effusion or pneumothorax. Musculoskeletal: Subacute or chronic appearing fractures of the anterior 5th and 6th and right ribs. Previously described proximal right humerus lesion is not included in the field of view. CT ABDOMEN PELVIS FINDINGS Hepatobiliary: Innumerable lesions are seen throughout the liver which are similar to most recent prior chest CT, but new when compared with more remote priors., index lesion of the left lobe of the liver measuring 3.1 by 0.0 cm on series 2, image 57 and index lesion of the right lobe of the liver measuring 1.9 x 1.6 cm on series 2, image 77. Gallbladder is unremarkable. No biliary ductal  dilation. Pancreas: Unremarkable. No pancreatic ductal dilatation or surrounding inflammatory changes. Spleen: Normal in size without focal abnormality. Adrenals/Urinary Tract: Adrenal glands are unremarkable. Kidneys are normal, without renal calculi, focal lesion, or hydronephrosis. Bladder is unremarkable. Stomach/Bowel: Stomach is within normal limits. No evidence of bowel wall thickening, distention, or inflammatory changes. Vascular/Lymphatic: Aortic atherosclerosis. Prominent subcentimeter left periaortic lymph node measuring 9 mm on series 2, image 78, previously 3 mm. Reproductive: Uterus and bilateral adnexa are unremarkable. Other: No abdominal wall hernia or abnormality. No abdominopelvic ascites. Musculoskeletal: New mild endplate compression deformity of L5. No aggressive appearing osseous lesions. IMPRESSION: 1. Bulky right supraclavicular node unchanged when compared with recent prior chest CT, but increased in size when compared with more remote priors. 2. Irregular right upper lobe pulmonary nodule and mildly enlarged right hilar lymph node, unchanged compared with multiple priors and likely posttreatment changes. 3. Small solid pulmonary nodules are seen in the right middle lobe which are unchanged when compared with recent prior chest CT but new when compared with more remote priors, possibly to infection or aspiration, although metastatic disease also  a concern. 4. Scattered debris seen in the right lower lobe bronchi, compatible with aspiration. 5. Innumerable lesions are seen throughout the liver which are similar to recent chest CT, but new when compared with more remote priors. 6. Interval increased size of subcentimeter left para-aortic lymph node compared with first 2023 abdomen and pelvis CT, concerning for metastatic disease. 7. Lesion of the right proximal humerus which was described on recent prior chest CT is not included in the field of view. 8. Aortic Atherosclerosis (ICD10-I70.0)  and Emphysema (ICD10-J43.9). Electronically Signed   By: Yetta Glassman M.D.   On: 08/20/2022 15:27   CT L-SPINE NO CHARGE  Result Date: 08/20/2022 CLINICAL DATA:  Metastatic cancer.  Back pain. EXAM: CT THORACIC AND LUMBAR SPINE WITHOUT CONTRAST TECHNIQUE: Multidetector CT imaging of the thoracic and lumbar spine was performed without contrast. Multiplanar CT image reconstructions were also generated. RADIATION DOSE REDUCTION: This exam was performed according to the departmental dose-optimization program which includes automated exposure control, adjustment of the mA and/or kV according to patient size and/or use of iterative reconstruction technique. COMPARISON:  Chest CT 08/16/2022 and CT chest, abdomen/pelvis from today. FINDINGS: CT THORACIC SPINE FINDINGS Alignment: Normal Vertebrae: Stable right-sided inferior endplate defect of T5. No acute fracture. No lytic destructive bone lesion is identified. Paraspinal and other soft tissues: 5 cm right supraclavicular nodal mass is noted. Stable advanced emphysematous changes are noted in the lungs along with scattered pulmonary nodules. No pleural effusions. Diffuse hepatic metastatic disease is again noted. Disc levels: No large thoracic disc protrusions, spinal or foraminal stenosis. CT LUMBAR SPINE FINDINGS Segmentation: There are five lumbar type vertebral bodies. The last full intervertebral disc space is labeled L5-S1. Alignment: Normal Vertebrae: New superior endplate fracture of L4 without obvious underlying pathologic lesion. No other lumbar fractures are identified. The facets are normally aligned. No pars defects. The visualized bony pelvis is intact. No obvious destructive lesions. Paraspinal and other soft tissues: Diffuse hepatic metastatic disease is demonstrated. Age advanced aortic and iliac artery calcifications but no aneurysm. No retroperitoneal adenopathy. Disc levels: L1-2: No significant findings. L2-3: Left paracentral disc protrusion  with mild mass effect on the left side of the thecal sac possibly impinging on the left L3 nerve root in the lateral recess. Mild foraminal encroachment also. L3-4: Shallow broad-based right foraminal and extraforaminal disc protrusion potentially irritating the right L3 nerve root. There is also mild right lateral recess stenosis which could affect the right L4 nerve root. L4-5: Mild diffuse annular bulge and moderate facet disease but no disc protrusion or foraminal stenosis. Suspect left-sided laminotomy. L5-S1: Diffuse annular bulge asymmetric right but no significant neural compression. IMPRESSION: 1. Stable right-sided inferior endplate defect of T5. No acute or significant findings in the thoracic spine. 2. Diffuse hepatic metastatic disease. 3. New superior endplate fracture of L4 without obvious underlying pathologic lesion. 4. Left paracentral disc protrusion at L2-3 possibly impinging on the left L3 nerve root in the lateral recess. 5. Shallow broad-based right foraminal and extraforaminal disc protrusion at L3-4 potentially irritating the right L3 nerve root in the lateral recess. 6. Bulging discs at L4-5 and L5-S1. Aortic Atherosclerosis (ICD10-I70.0). Electronically Signed   By: Marijo Sanes M.D.   On: 08/20/2022 15:06   CT T-SPINE NO CHARGE  Result Date: 08/20/2022 CLINICAL DATA:  Metastatic cancer.  Back pain. EXAM: CT THORACIC AND LUMBAR SPINE WITHOUT CONTRAST TECHNIQUE: Multidetector CT imaging of the thoracic and lumbar spine was performed without contrast. Multiplanar CT image reconstructions were  also generated. RADIATION DOSE REDUCTION: This exam was performed according to the departmental dose-optimization program which includes automated exposure control, adjustment of the mA and/or kV according to patient size and/or use of iterative reconstruction technique. COMPARISON:  Chest CT 08/16/2022 and CT chest, abdomen/pelvis from today. FINDINGS: CT THORACIC SPINE FINDINGS Alignment: Normal  Vertebrae: Stable right-sided inferior endplate defect of T5. No acute fracture. No lytic destructive bone lesion is identified. Paraspinal and other soft tissues: 5 cm right supraclavicular nodal mass is noted. Stable advanced emphysematous changes are noted in the lungs along with scattered pulmonary nodules. No pleural effusions. Diffuse hepatic metastatic disease is again noted. Disc levels: No large thoracic disc protrusions, spinal or foraminal stenosis. CT LUMBAR SPINE FINDINGS Segmentation: There are five lumbar type vertebral bodies. The last full intervertebral disc space is labeled L5-S1. Alignment: Normal Vertebrae: New superior endplate fracture of L4 without obvious underlying pathologic lesion. No other lumbar fractures are identified. The facets are normally aligned. No pars defects. The visualized bony pelvis is intact. No obvious destructive lesions. Paraspinal and other soft tissues: Diffuse hepatic metastatic disease is demonstrated. Age advanced aortic and iliac artery calcifications but no aneurysm. No retroperitoneal adenopathy. Disc levels: L1-2: No significant findings. L2-3: Left paracentral disc protrusion with mild mass effect on the left side of the thecal sac possibly impinging on the left L3 nerve root in the lateral recess. Mild foraminal encroachment also. L3-4: Shallow broad-based right foraminal and extraforaminal disc protrusion potentially irritating the right L3 nerve root. There is also mild right lateral recess stenosis which could affect the right L4 nerve root. L4-5: Mild diffuse annular bulge and moderate facet disease but no disc protrusion or foraminal stenosis. Suspect left-sided laminotomy. L5-S1: Diffuse annular bulge asymmetric right but no significant neural compression. IMPRESSION: 1. Stable right-sided inferior endplate defect of T5. No acute or significant findings in the thoracic spine. 2. Diffuse hepatic metastatic disease. 3. New superior endplate fracture of  L4 without obvious underlying pathologic lesion. 4. Left paracentral disc protrusion at L2-3 possibly impinging on the left L3 nerve root in the lateral recess. 5. Shallow broad-based right foraminal and extraforaminal disc protrusion at L3-4 potentially irritating the right L3 nerve root in the lateral recess. 6. Bulging discs at L4-5 and L5-S1. Aortic Atherosclerosis (ICD10-I70.0). Electronically Signed   By: Marijo Sanes M.D.   On: 08/20/2022 15:06        Scheduled Meds:  enoxaparin (LOVENOX) injection  30 mg Subcutaneous Q24H   lidocaine  1 patch Transdermal Q24H   nicotine  21 mg Transdermal Daily   oxyCODONE  15 mg Oral Q12H   senna-docusate  1 tablet Oral BID   Continuous Infusions:   LOS: 1 day    Time spent: 40 minutes    Irine Seal, MD [facility]   To contact the attending provider between 7A-7P or the covering provider during after hours 7P-7A, please log into the web site [email or web address] and access using universal [place] password for that web site. If you do not have the password, please call the hospital operator.  08/22/2022, 11:38 AM

## 2022-08-22 NOTE — Progress Notes (Signed)
OT Cancellation Note  Patient Details Name: [name] MRN: [id number] DOB: [birth year]   Cancelled Treatment:    Reason Eval/Treat Not Completed: OT screened, no needs identified, will sign off. Per nursing patient independent with ADLs and independent to bathroom. Patient ambulated well with PT. No apparent OT needs. Will sign off.  Stuti Sandin L Verdie Wilms 08/22/2022, 11:20 AM

## 2022-08-23 DIAGNOSIS — M5441 Lumbago with sciatica, right side: Secondary | ICD-10-CM

## 2022-08-23 DIAGNOSIS — Z515 Encounter for palliative care: Secondary | ICD-10-CM

## 2022-08-23 DIAGNOSIS — M5442 Lumbago with sciatica, left side: Secondary | ICD-10-CM

## 2022-08-23 DIAGNOSIS — E538 Deficiency of other specified B group vitamins: Secondary | ICD-10-CM | POA: Diagnosis present

## 2022-08-23 LAB — BASIC METABOLIC PANEL
Anion gap: 7 (ref 5–15)
BUN: 19 mg/dL (ref 6–20)
CO2: 28 mmol/L (ref 22–32)
Calcium: 9.6 mg/dL (ref 8.9–10.3)
Chloride: 104 mmol/L (ref 98–111)
Creatinine, Ser: 1.14 mg/dL — ABNORMAL HIGH (ref 0.44–1.00)
GFR, Estimated: 59 mL/min — ABNORMAL LOW (ref >60)
Glucose, Bld: 76 mg/dL (ref 70–99)
Potassium: 4.1 mmol/L (ref 3.5–5.1)
Sodium: 139 mmol/L (ref 135–145)

## 2022-08-23 LAB — CBC
HCT: 35.8 % — ABNORMAL LOW (ref 36.0–46.0)
Hemoglobin: 11.4 g/dL — ABNORMAL LOW (ref 12.0–15.0)
MCH: 35.4 pg — ABNORMAL HIGH (ref 26.0–34.0)
MCHC: 31.8 g/dL (ref 30.0–36.0)
MCV: 111.2 fL — ABNORMAL HIGH (ref 80.0–100.0)
Platelets: 196 10*3/uL (ref 150–400)
RBC: 3.22 MIL/uL — ABNORMAL LOW (ref 3.87–5.11)
RDW: 13.5 % (ref 11.5–15.5)
WBC: 6.2 10*3/uL (ref 4.0–10.5)
nRBC: 0 % (ref 0.0–0.2)

## 2022-08-23 MED ORDER — SENNOSIDES-DOCUSATE SODIUM 8.6-50 MG PO TABS: 1.0000 | ORAL_TABLET | Freq: Two times a day (BID) | ORAL | Status: AC

## 2022-08-23 MED ORDER — HYDROMORPHONE HCL 4 MG PO TABS: 4.0000 mg | ORAL_TABLET | ORAL | 0 refills | Status: AC | PRN

## 2022-08-23 MED ORDER — NICOTINE 21 MG/24HR TD PT24: 21.0000 mg | MEDICATED_PATCH | Freq: Every day | TRANSDERMAL | 0 refills | Status: AC

## 2022-08-23 MED ORDER — DEXAMETHASONE 4 MG PO TABS: 4.0000 mg | ORAL_TABLET | Freq: Every morning | ORAL | 0 refills | Status: AC

## 2022-08-23 MED ORDER — LORAZEPAM 2 MG/ML IJ SOLN
0.5000 mg | INTRAMUSCULAR | Status: AC
  Administered 2022-08-23: 0.5 mg via INTRAVENOUS
  Filled 2022-08-23: qty 1

## 2022-08-23 MED ORDER — ENOXAPARIN SODIUM 40 MG/0.4ML IJ SOSY: 40.0000 mg | PREFILLED_SYRINGE | INTRAMUSCULAR | Status: DC

## 2022-08-23 MED ORDER — KETOROLAC TROMETHAMINE 10 MG PO TABS: 10.0000 mg | ORAL_TABLET | Freq: Two times a day (BID) | ORAL | 0 refills | Status: AC

## 2022-08-23 MED ORDER — OXYCODONE HCL ER 15 MG PO T12A: 15.0000 mg | EXTENDED_RELEASE_TABLET | Freq: Two times a day (BID) | ORAL | 0 refills | Status: AC

## 2022-08-23 MED ORDER — LORAZEPAM 1 MG PO TABS: 1.0000 mg | ORAL_TABLET | Freq: Three times a day (TID) | ORAL | 0 refills | Status: AC | PRN

## 2022-08-23 MED ORDER — MELATONIN 5 MG PO TABS: 5.0000 mg | ORAL_TABLET | Freq: Every evening | ORAL | 0 refills | Status: AC | PRN

## 2022-08-23 MED ORDER — FOLIC ACID 1 MG PO TABS: 1.0000 mg | ORAL_TABLET | Freq: Every day | ORAL | 3 refills | Status: AC

## 2022-08-23 MED ORDER — ACETAMINOPHEN 500 MG PO TABS: 1000.0000 mg | ORAL_TABLET | Freq: Four times a day (QID) | ORAL | 0 refills | Status: AC | PRN

## 2022-08-23 MED ORDER — GABAPENTIN 300 MG PO CAPS: 300.0000 mg | ORAL_CAPSULE | Freq: Two times a day (BID) | ORAL | 0 refills | Status: AC

## 2022-08-23 MED ORDER — LORAZEPAM 2 MG/ML IJ SOLN: 0.5000 mg | INTRAMUSCULAR | Status: DC | PRN

## 2022-08-23 NOTE — Progress Notes (Signed)
Mobility Specialist - Progress Note   08/23/22 1535  Mobility  HOB Elevated/Bed Position Self regulated  Activity Ambulated independently in hallway  Range of Motion/Exercises Active  Level of Assistance Independent  Assistive Device None  Distance Ambulated (ft) 20 ft  Activity Response Tolerated well  Transport method Ambulatory  $Mobility charge 1 Mobility   Pt received in bed and agreeable to mobility. No complaints during ambulation. Pt to bed after session with all needs met.    [facility]

## 2022-08-23 NOTE — Progress Notes (Signed)
   This pt was referred to hospice services and we have been able to review the records and discuss services with pt and her mother. They are in agreement with hospice care at home and would like to proceed with services after d/c. I have talked to our MD Dr. Domingo Cocking and she has been approved for hospice services at home.   We will be able to enroll pt this evening in the services after d/c. We will be able to assist with medications to ensure comfort as well.   MD notified and he is in agreement with pt d/c this evening. The pt's mother will take her home by car.   Webb Silversmith RN [phone]

## 2022-08-23 NOTE — Discharge Summary (Signed)
Physician Discharge Summary  [name] WCB:[id number] DOB: 16-Nov-1971 DOA: 08/20/2022  PCP: [name], PA-C  Admit date: 08/20/2022 Discharge date: 08/23/2022  Time spent: 60 minutes  Recommendations for Outpatient Follow-up:  Patient was discharged home with hospice.  Follow-up with hospice MD. Follow-up with [name], PA-C in 2 to 3 weeks.  On follow-up patient will need a basic metabolic profile, magnesium level checked to follow-up on electrolytes and renal function.  Folate deficiency will need to be followed up upon.   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 Shanna Cisco is a 51 y.o. female with medical history significant for metastatic small cell lung cancer, chronic tobacco abuse, who is admitted to [facility] on 08/20/2022 by way of transfer from [facility] emergency department for additional pain control in the setting of intractable low back pain.    The patient conveys to 3 days of new onset midline low back pain, which she describes as sharp/stabbing in nature, with some radiation into the posterior aspects of the bilateral legs.  Worse with standing.  Has become constant over the last 1 to 2 days, and is significantly impacting her ability to ambulate on the basis of associated poor pain control while using her home analgesic regimen which consists of prn Percocet.  Denies any objective weakness of the lower extremities, nor any acute focal numbness or paresthesias, including no report of  saddle anesthesia.  She also denies any recent fecal incontinence or urinary retention.  Denies any recent preceding trauma.   Denies history of any recent dysuria, gross hematuria, or change in urinary urgency/frequency.  No recent diarrhea, melena, hematochezia.  Denies any recent subjective fever, chills, rigors, or generalized myalgias.  No recent chest pain, shortness of breath, palpitations, diaphoresis.    Medical history notable for metastatic small cell lung cancer, with known metastatic disease to the liver.  Her underlying malignancy was reportedly originally diagnosed in October 2022 and was initially complicated by associated right hilar/suprahilar mass that was occlusive of the right upper bronchus.  She follows with Dr. Bretta Bang as her outpatient oncologist, and is known to have undergone radiation to the right lung in November/December 2022 as well as ensuing chemotherapy.  Per chart review, oncologic team considering palliative radiation for additional metastatic disease.         [facility] ED Course:  Vital signs in the ED were notable for the following: Afebrile; heart rate initially 125, which decreased into the low 100s following interval IV fluids as well as multiple doses of IV Dilaudid, as below; systolic blood pressures in the 110s to 130s; respiratory rate 17-20, oxygen saturation 95 to 1% on room air.   Labs were notable for the following: CMP notable for the following: Sodium 136, bicarbonate 18, anion gap 11, creatinine 1.12 compared to most recent prior value of 1.  3 3 on 08/16/2022, glucose 82, calcium 10.1, albumin 4.2, alkaline phosphatase mildly elevated at 144 compared to 132 on 08/16/2022,  with other limb of the liver enzymes within normal limits.  She magnesium level 1.6.  CBC notable for episode count 8900.  Urinalysis ordered, with result currently pending.   Imaging and additional notable ED work-up: CAT scan of the thoracic and lumbar spine showed no acute findings  of the thoracic spine, will demonstrating stable right-sided inferior endplate defect left T5; new superior endplate fracture of L4 without obvious underlying pathologic lesion; left paracentral disc protrusion at L2-L3, possibly impinging on the left L3 nerve.  Additionally, CT abdomen/pelvis with contrast showed unchanged right supraclavicular node, multiple pulmonary nodules, also appear unchanged relative to most recent set of imaging; numerous lesions throughout the liver, similar to most recent prior CT; interval increase in size of subcentimeter left periaortic lymph node, concerning for further metastatic disease.   While in the ED, the following were administered: Dilaudid 0.5 mg IV x2 doses, Zofran 4 mg IV x1, lactated Ringer's times other bolus, magnesium sulfate 2 g IV every 2 hours x1 dose.   Subsequently, in the setting of ongoing poor pain control in spite of the above multiple doses of IV analgesia, the patient was admitted to [facility] for further evaluation and management of her intractable acute low back pain.     Hospital Course:  #1 intractable acute low back pain likely secondary to metastatic disease -Patient with new onset low back pain occurring over the past 2 to 3 days with severely limiting patient's ambulation due to suboptimal pain control.  Patient with no significant lower extremity weakness, no evidence of saddle anesthesia, no bowel or urinary incontinence. -CT T and L-spine with new superior endplate fracture of L4, as well as left paracentral disc protrusion at L2/L3 with potential impingement on the left L3 nerve.  CT with no evidence of contributing underlying pathologic lesion including no overt evidence of contribution from metastatic fracture. -MRI of the L-spine done with innumerable contrast-enhancing lesions throughout the visualized lumbar spine, sacrum, bilateral iliac bones consistent with osseous metastatic disease.  Small amount of contrast-enhancing  material within the ventral epidural space at L2-L3 level on the left could represent epidural spread of tumor.  Redemonstrated mild anterior superior endplate compression deformity at L4 level redemonstrated mild anterior superior endplate compression deformity at L4 level, unchanged compared to recent prior CT chest abdomen and pelvis.  Asymmetric contrast-enhancement at the right L3-L4 facet joint may be reactive secondary to degenerative changes. -Patient initially placed on IV Dilaudid as needed, home regimen Percocet, OxyContin 50 mg twice daily as well as Lidoderm patch.  -Pain was somewhat controlled however patient was still in significant pain, palliative care consulted patient placed on a Dilaudid PCA pump with better pain control.   -Patient will be discharged home continue with hospice following, and palliative care has placed orders for patient's pain and symptom management on discharge.   -Patient will be discharged home with hospice following we will continue to manage patient's pain and symptom management.    2.  Hypomagnesemia -Magnesium on admission noted at 1.6. -Patient s/p 2 g of IV magnesium at [place] ED, repeat magnesium 2.1. -Outpatient follow-up.   3.  Metastatic small cell lung cancer with known mets to various lymph nodes, metastatic disease to the liver. -Patient follows with Dr. Curt Bears in the outpatient setting, Dr. Lorna Few informed of admission via epic. -Per mother and patient patient does not want any further chemotherapy or radiation treatment and as such palliative care has been consulted for goals of care and  symptom management.  Patient seen by Dr. Lorna Few during the hospitalization, patient also seen by palliative care, patient started on a Dilaudid PCA pump for better pain management. -Patient's oncologist, Dr. Lorna Few was in agreement with patient's decision to continue with palliative and comfort care at this  point. -Patient will be discharged home with hospice following.   4.  Chronic tobacco use -Tobacco cessation. -Patient placed on a nicotine patch.    5.  Anemia/folate deficiency -Likely anemia of chronic disease. -Patient with no overt bleeding. -Anemia panel with a folate level of 4.3, ferritin of 1954, iron of 51, TIBC of 258.   -Hemoglobin remained stable at 11.4 during the hospitalization. -Patient placed on folic acid 1 mg daily. -Outpatient follow-up with PCP.   6.  Hypokalemia -Repleted.     Procedures: CT chest abdomen and pelvis 08/20/2022 MRI L-spine 08/21/2022 CT L-spine 08/20/2022 CT T-spine 08/20/2022  Consultations: Oncology: Dr. Curt Bears 09/22/2022 Palliative care: Dr. Hilma Favors 08/23/2022  Discharge Exam: Vitals:   08/23/22 0738 08/23/22 1451  BP:  115/88  Pulse:  (!) 101  Resp: 14 18  Temp:  97.8 F (36.6 C)  SpO2: 92% (!) 87%    General: NAD Cardiovascular: Regular rate rhythm no murmurs rubs or gallops.  No JVD.  No lower extremity edema. Respiratory: Lungs clear to auscultation bilaterally.  No wheezes, no crackles, no rhonchi.  Discharge Instructions   Discharge Instructions     Diet general   Complete by: As directed    Increase activity slowly   Complete by: As directed       Allergies as of 08/23/2022       Reactions   Compazine [prochlorperazine] Other (See Comments)   Hypotension and lethargy   Naproxen Nausea Only   Bactrim [sulfamethoxazole-trimethoprim] Nausea Only        Medication List     STOP taking these medications    oxyCODONE-acetaminophen 5-325 MG tablet Commonly known as: PERCOCET/ROXICET       TAKE these medications    acetaminophen 500 MG tablet Commonly known as: TYLENOL Take 2 tablets (1,000 mg total) by mouth every 6 (six) hours as needed for mild pain or fever (or Fever >/= 101).   dexamethasone 4 MG tablet Commonly known as: DECADRON Take 1-2 tablets (4-8 mg total) by mouth every  morning. Start with 1 tablet every morning and take second tablet as directed by hospice provider.   folic acid 1 MG tablet Commonly known as: FOLVITE Take 1 tablet (1 mg total) by mouth daily.   gabapentin 300 MG capsule Commonly known as: NEURONTIN Take 1 capsule (300 mg total) by mouth 2 (two) times daily.   HYDROmorphone 4 MG tablet Commonly known as: DILAUDID Take 1 tablet (4 mg total) by mouth every 4 (four) hours as needed for severe pain.   ketorolac 10 MG tablet Commonly known as: TORADOL Take 1 tablet (10 mg total) by mouth 2 (two) times daily.   LORazepam 1 MG tablet Commonly known as: ATIVAN Take 1 tablet (1 mg total) by mouth every 8 (eight) hours as needed for anxiety.   melatonin 5 MG Tabs Take 1 tablet (5 mg total) by mouth at bedtime as needed.   nicotine 21 mg/24hr patch Commonly known as: NICODERM CQ - dosed in mg/24 hours Place 1 patch (21 mg total) onto the skin daily. Start taking on: August 24, 2022   oxyCODONE 15 mg 12 hr tablet Commonly known as: OXYCONTIN Take 1 tablet (15 mg  total) by mouth every 12 (twelve) hours.   senna-docusate 8.6-50 MG tablet Commonly known as: Senokot-S Take 1 tablet by mouth 2 (two) times daily.   Ventolin HFA 108 (90 Base) MCG/ACT inhaler Generic drug: albuterol Inhale 2 puffs into the lungs every 6 (six) hours as needed for wheezing or shortness of breath.       Allergies  Allergen Reactions   Compazine [Prochlorperazine] Other (See Comments)    Hypotension and lethargy   Naproxen Nausea Only   Bactrim [Sulfamethoxazole-Trimethoprim] Nausea Only    Follow-up Information     Follow-up with hospice MD. Follow up.          Livengood, Marva Panda, PA-C. Schedule an appointment as soon as possible for a visit in 2 week(s).   Specialty: Physician Assistant Why: Follow-up in 2 to 3 weeks. Contact information: [address] [place] [address] [phone]                  The results  of significant diagnostics from this hospitalization (including imaging, microbiology, ancillary and laboratory) are listed below for reference.    Significant Diagnostic Studies: MR Lumbar Spine W Wo Contrast  Result Date: 08/21/2022 CLINICAL DATA:  History of metastatic small cell lung cancer who presents with acute intractable lower back pain. CT lumbar spine shows a new superior endplate fracture at L4. EXAM: MRI LUMBAR SPINE WITHOUT AND WITH CONTRAST TECHNIQUE: Multiplanar and multiecho pulse sequences of the lumbar spine were obtained without and with intravenous contrast. CONTRAST:  6 ml Vueway COMPARISON:  CT CAP 08/20/22, CT T/L Spine 08/20/22 FINDINGS: Segmentation:  Standard. Alignment:  Physiologic. Vertebrae: There are innumerable T1 hypointense, T2 hyperintense, contrast-enhancing lesions throughout the visualized lumbar spine, sacrum, and the bilateral iliac bones. Redemonstrated is a mild anterior superior endplate compression deformity at the L4 level, unchanged compared to recent prior CT chest abdomen and pelvis. Conus medullaris and cauda equina: Conus extends to the L1 level. Conus and cauda equina appear normal. Paraspinal and other soft tissues: Please see previously dictated CT chest abdomen and pelvis for intra-findings, including multifocal contrast-enhancing hepatic lesions. Disc levels: T11-T12: Only imaged in the sagittal plane. No evidence of spinal canal or neural foraminal stenosis. T12-L1: Small central disc protrusion. No spinal canal or neural foraminal stenosis. L1-L2: Mild bilateral facet degenerative change. No evidence of spinal canal or neural foraminal stenosis. L2-L3: Within the subarticular space on the left there is a small amount of contrast-enhancing soft tissue (series 11, image 17) that narrows the left lateral recess. Mild left neural foraminal narrowing. No right neural foraminal narrowing. No overall spinal canal stenosis. L3-L4: Circumferential disc bulge. No  spinal canal stenosis. No neural foraminal stenosis. There is asymmetric contrast enhancement at the right L3-L4 facet joint (series 10, image 5). L4-L5: Moderate bilateral facet degenerative change. Circumferential disc bulge. No overall spinal canal stenosis. Mild bilateral neural foraminal stenosis. L5-S1: Small circumferential disc bulge. Mild bilateral facet degenerative change. No neural foraminal stenosis. No spinal canal stenosis. IMPRESSION: 1. Innumerable contrast-enhancing lesions throughout the visualized lumbar spine, sacrum, and bilateral iliac bones, consistent with osseous metastatic disease. There is a small amount of contrast-enhancing material within the ventral epidural space at the L2-L3 level on the left, which could represent epidural spread of tumor. 2. Redemonstrated mild anterior superior endplate compression deformity at the L4 level, unchanged compared to recent prior CT chest abdomen and pelvis. 3. Asymmetric contrast enhanacement at the right L3-L4 facet joint. This may be reactive  secondary to degenerative changes. 4. Please see previously dictated CT chest abdomen and pelvis for intra-abdominal findings, including multifocal contrast-enhancing hepatic lesions. Electronically Signed   By: Marin Roberts M.D.   On: 08/21/2022 10:05   CT CHEST ABDOMEN PELVIS W CONTRAST  Result Date: 08/20/2022 CLINICAL DATA:  Metastatic disease evaluation; * Tracking Code: BO * EXAM: CT CHEST, ABDOMEN, AND PELVIS WITH CONTRAST TECHNIQUE: Multidetector CT imaging of the chest, abdomen and pelvis was performed following the standard protocol during bolus administration of intravenous contrast. RADIATION DOSE REDUCTION: This exam was performed according to the departmental dose-optimization program which includes automated exposure control, adjustment of the mA and/or kV according to patient size and/or use of iterative reconstruction technique. CONTRAST:  50mL OMNIPAQUE IOHEXOL 300 MG/ML  SOLN  COMPARISON:  Multiple priors, most recent chest CT dated August 16, 2022 and abdomen and pelvis CT dated Mar 25, 2022 FINDINGS: CT CHEST FINDINGS Cardiovascular: Normal heart size. Trace pericardial effusion. Normal caliber thoracic aorta with mild atherosclerotic disease. No suspicious filling defects of the central pulmonary arteries. Mediastinum/Nodes: Esophagus and thyroid are unremarkable. Right supraclavicular lymph node measuring 4.3 x 3.4 cm, unchanged when compared with recent prior chest CT, increased in size when compared with more remote priors. Mildly enlarged right hilar lymph node measuring 1.1 cm in short axis on series 2, image 27 and mild asymmetric right hilar soft tissue, unchanged compared with multiple priors. Lungs/Pleura: Central airways are patent. Severe centrilobular emphysema. Debris seen in the lower lobe bronchi, likely due to aspiration. Reference irregular right upper lobe pulmonary nodule measuring approximately 1.4 x 0.9 cm on series 4 image 51, unchanged when compared with multiple prior exams. Few small scattered pulmonary nodules are seen right middle lobe which are unchanged when compared with recent prior, but when compared with July 01, 2022 prior. Nodule measuring 4 mm on series 4, image 85. Reference perifissural nodule measuring 4 mm on series 4, image 76. In the no pleural effusion or pneumothorax. Musculoskeletal: Subacute or chronic appearing fractures of the anterior 5th and 6th and right ribs. Previously described proximal right humerus lesion is not included in the field of view. CT ABDOMEN PELVIS FINDINGS Hepatobiliary: Innumerable lesions are seen throughout the liver which are similar to most recent prior chest CT, but new when compared with more remote priors., index lesion of the left lobe of the liver measuring 3.1 by 0.0 cm on series 2, image 57 and index lesion of the right lobe of the liver measuring 1.9 x 1.6 cm on series 2, image 77. Gallbladder is  unremarkable. No biliary ductal dilation. Pancreas: Unremarkable. No pancreatic ductal dilatation or surrounding inflammatory changes. Spleen: Normal in size without focal abnormality. Adrenals/Urinary Tract: Adrenal glands are unremarkable. Kidneys are normal, without renal calculi, focal lesion, or hydronephrosis. Bladder is unremarkable. Stomach/Bowel: Stomach is within normal limits. No evidence of bowel wall thickening, distention, or inflammatory changes. Vascular/Lymphatic: Aortic atherosclerosis. Prominent subcentimeter left periaortic lymph node measuring 9 mm on series 2, image 78, previously 3 mm. Reproductive: Uterus and bilateral adnexa are unremarkable. Other: No abdominal wall hernia or abnormality. No abdominopelvic ascites. Musculoskeletal: New mild endplate compression deformity of L5. No aggressive appearing osseous lesions. IMPRESSION: 1. Bulky right supraclavicular node unchanged when compared with recent prior chest CT, but increased in size when compared with more remote priors. 2. Irregular right upper lobe pulmonary nodule and mildly enlarged right hilar lymph node, unchanged compared with multiple priors and likely posttreatment changes. 3. Small solid pulmonary nodules are  seen in the right middle lobe which are unchanged when compared with recent prior chest CT but new when compared with more remote priors, possibly to infection or aspiration, although metastatic disease also a concern. 4. Scattered debris seen in the right lower lobe bronchi, compatible with aspiration. 5. Innumerable lesions are seen throughout the liver which are similar to recent chest CT, but new when compared with more remote priors. 6. Interval increased size of subcentimeter left para-aortic lymph node compared with first 2023 abdomen and pelvis CT, concerning for metastatic disease. 7. Lesion of the right proximal humerus which was described on recent prior chest CT is not included in the field of view. 8. Aortic  Atherosclerosis (ICD10-I70.0) and Emphysema (ICD10-J43.9). Electronically Signed   By: Yetta Glassman M.D.   On: 08/20/2022 15:27   CT L-SPINE NO CHARGE  Result Date: 08/20/2022 CLINICAL DATA:  Metastatic cancer.  Back pain. EXAM: CT THORACIC AND LUMBAR SPINE WITHOUT CONTRAST TECHNIQUE: Multidetector CT imaging of the thoracic and lumbar spine was performed without contrast. Multiplanar CT image reconstructions were also generated. RADIATION DOSE REDUCTION: This exam was performed according to the departmental dose-optimization program which includes automated exposure control, adjustment of the mA and/or kV according to patient size and/or use of iterative reconstruction technique. COMPARISON:  Chest CT 08/16/2022 and CT chest, abdomen/pelvis from today. FINDINGS: CT THORACIC SPINE FINDINGS Alignment: Normal Vertebrae: Stable right-sided inferior endplate defect of T5. No acute fracture. No lytic destructive bone lesion is identified. Paraspinal and other soft tissues: 5 cm right supraclavicular nodal mass is noted. Stable advanced emphysematous changes are noted in the lungs along with scattered pulmonary nodules. No pleural effusions. Diffuse hepatic metastatic disease is again noted. Disc levels: No large thoracic disc protrusions, spinal or foraminal stenosis. CT LUMBAR SPINE FINDINGS Segmentation: There are five lumbar type vertebral bodies. The last full intervertebral disc space is labeled L5-S1. Alignment: Normal Vertebrae: New superior endplate fracture of L4 without obvious underlying pathologic lesion. No other lumbar fractures are identified. The facets are normally aligned. No pars defects. The visualized bony pelvis is intact. No obvious destructive lesions. Paraspinal and other soft tissues: Diffuse hepatic metastatic disease is demonstrated. Age advanced aortic and iliac artery calcifications but no aneurysm. No retroperitoneal adenopathy. Disc levels: L1-2: No significant findings. L2-3: Left  paracentral disc protrusion with mild mass effect on the left side of the thecal sac possibly impinging on the left L3 nerve root in the lateral recess. Mild foraminal encroachment also. L3-4: Shallow broad-based right foraminal and extraforaminal disc protrusion potentially irritating the right L3 nerve root. There is also mild right lateral recess stenosis which could affect the right L4 nerve root. L4-5: Mild diffuse annular bulge and moderate facet disease but no disc protrusion or foraminal stenosis. Suspect left-sided laminotomy. L5-S1: Diffuse annular bulge asymmetric right but no significant neural compression. IMPRESSION: 1. Stable right-sided inferior endplate defect of T5. No acute or significant findings in the thoracic spine. 2. Diffuse hepatic metastatic disease. 3. New superior endplate fracture of L4 without obvious underlying pathologic lesion. 4. Left paracentral disc protrusion at L2-3 possibly impinging on the left L3 nerve root in the lateral recess. 5. Shallow broad-based right foraminal and extraforaminal disc protrusion at L3-4 potentially irritating the right L3 nerve root in the lateral recess. 6. Bulging discs at L4-5 and L5-S1. Aortic Atherosclerosis (ICD10-I70.0). Electronically Signed   By: Marijo Sanes M.D.   On: 08/20/2022 15:06   CT T-SPINE NO CHARGE  Result Date: 08/20/2022 CLINICAL DATA:  Metastatic cancer.  Back pain. EXAM: CT THORACIC AND LUMBAR SPINE WITHOUT CONTRAST TECHNIQUE: Multidetector CT imaging of the thoracic and lumbar spine was performed without contrast. Multiplanar CT image reconstructions were also generated. RADIATION DOSE REDUCTION: This exam was performed according to the departmental dose-optimization program which includes automated exposure control, adjustment of the mA and/or kV according to patient size and/or use of iterative reconstruction technique. COMPARISON:  Chest CT 08/16/2022 and CT chest, abdomen/pelvis from today. FINDINGS: CT THORACIC SPINE  FINDINGS Alignment: Normal Vertebrae: Stable right-sided inferior endplate defect of T5. No acute fracture. No lytic destructive bone lesion is identified. Paraspinal and other soft tissues: 5 cm right supraclavicular nodal mass is noted. Stable advanced emphysematous changes are noted in the lungs along with scattered pulmonary nodules. No pleural effusions. Diffuse hepatic metastatic disease is again noted. Disc levels: No large thoracic disc protrusions, spinal or foraminal stenosis. CT LUMBAR SPINE FINDINGS Segmentation: There are five lumbar type vertebral bodies. The last full intervertebral disc space is labeled L5-S1. Alignment: Normal Vertebrae: New superior endplate fracture of L4 without obvious underlying pathologic lesion. No other lumbar fractures are identified. The facets are normally aligned. No pars defects. The visualized bony pelvis is intact. No obvious destructive lesions. Paraspinal and other soft tissues: Diffuse hepatic metastatic disease is demonstrated. Age advanced aortic and iliac artery calcifications but no aneurysm. No retroperitoneal adenopathy. Disc levels: L1-2: No significant findings. L2-3: Left paracentral disc protrusion with mild mass effect on the left side of the thecal sac possibly impinging on the left L3 nerve root in the lateral recess. Mild foraminal encroachment also. L3-4: Shallow broad-based right foraminal and extraforaminal disc protrusion potentially irritating the right L3 nerve root. There is also mild right lateral recess stenosis which could affect the right L4 nerve root. L4-5: Mild diffuse annular bulge and moderate facet disease but no disc protrusion or foraminal stenosis. Suspect left-sided laminotomy. L5-S1: Diffuse annular bulge asymmetric right but no significant neural compression. IMPRESSION: 1. Stable right-sided inferior endplate defect of T5. No acute or significant findings in the thoracic spine. 2. Diffuse hepatic metastatic disease. 3. New  superior endplate fracture of L4 without obvious underlying pathologic lesion. 4. Left paracentral disc protrusion at L2-3 possibly impinging on the left L3 nerve root in the lateral recess. 5. Shallow broad-based right foraminal and extraforaminal disc protrusion at L3-4 potentially irritating the right L3 nerve root in the lateral recess. 6. Bulging discs at L4-5 and L5-S1. Aortic Atherosclerosis (ICD10-I70.0). Electronically Signed   By: Marijo Sanes M.D.   On: 08/20/2022 15:06   CT Angio Chest PE W and/or Wo Contrast  Result Date: 08/16/2022 CLINICAL DATA:  Pulmonary embolism (PE) suspected, high prob. History of lung cancer EXAM: CT ANGIOGRAPHY CHEST WITH CONTRAST TECHNIQUE: Multidetector CT imaging of the chest was performed using the standard protocol during bolus administration of intravenous contrast. Multiplanar CT image reconstructions and MIPs were obtained to evaluate the vascular anatomy. RADIATION DOSE REDUCTION: This exam was performed according to the departmental dose-optimization program which includes automated exposure control, adjustment of the mA and/or kV according to patient size and/or use of iterative reconstruction technique. CONTRAST:  13mL OMNIPAQUE IOHEXOL 350 MG/ML SOLN COMPARISON:  CT 07/01/2022 FINDINGS: Cardiovascular: Heart size is normal. Trace pericardial effusion. Satisfactory opacification of the pulmonary arteries. No filling defect to the segmental branch level to suggest pulmonary embolism. Main pulmonary artery measures 3.2 cm in diameter. Thoracic aorta is nonaneurysmal. Scattered atherosclerotic calcifications of the aorta. Reflux of contrast into the IVC  and hepatic veins. Mediastinum/Nodes: Enlarging right supraclavicular lymph node measuring 3.7 cm in diameter (series 4, image 13) previously measured 1.5 cm. Mildly enlarged 1.0 cm right hilar lymph node is stable from prior (series 4, image 75). No axillary or mediastinal lymphadenopathy. Thyroid, trachea, and  esophagus within normal limits. Lungs/Pleura: Irregular solid right upper lobe pulmonary nodule measuring approximately 1.4 x 0.9 cm (series 10, image 60), stable in size and appearance from the previous study. Additional 0.4 cm left upper lobe and 0.3 cm right lower lobe pulmonary nodules are stable as well (series 10, images 43 and 92). Severe centrilobular emphysema. No airspace consolidation. No pleural effusion or pneumothorax. Upper Abdomen: Innumerable ill-defined areas of subtle hypoattenuation throughout the visualized liver is suspicious for metastatic disease in the setting of known malignancy (for example series 4, image 148). Musculoskeletal: Subtle nodular foci of increased attenuation within the visualized proximal right humerus could potentially represent sites of osseous metastatic disease (series 6, images 50-60). Review of the MIP images confirms the above findings. IMPRESSION: 1. No evidence of pulmonary embolism. 2. Significant interval enlargement of a right supraclavicular lymph node measuring 3.7 cm in diameter, previously measured 1.5 cm on 07/01/2022, concerning for metastatic disease. 3. Innumerable ill-defined areas of subtle hypoattenuation throughout the visualized liver is suspicious for metastatic disease in the setting of known malignancy. Dedicated CT of the abdomen and pelvis with contrast can be obtained further assess. Alternatively, a restaging PET-CT should be considered. 4. Subtle nodular foci of increased attenuation within the visualized proximal right humerus could potentially represent sites of osseous metastatic disease. 5. Severe centrilobular emphysema with stable 1.4 cm right upper lobe pulmonary nodule and additional scattered small bilateral pulmonary nodules. 6. Reflux of contrast into the IVC and hepatic veins, suggestive of right heart dysfunction. Aortic Atherosclerosis (ICD10-I70.0) and Emphysema (ICD10-J43.9). Electronically Signed   By: Davina Poke D.O.    On: 08/16/2022 12:33   DG Chest 2 View  Result Date: 08/16/2022 CLINICAL DATA:  LEFT-sided chest pain and shoulder pain radiating to back for 2 days. Shortness of breath and lightheadedness. EXAM: CHEST - 2 VIEW COMPARISON:  Chest x-ray dated 04/28/2022 FINDINGS: Heart size and mediastinal contours are within normal limits. Lungs are hyperexpanded. Coarse lung markings are seen bilaterally. No confluent opacity to suggest a developing pneumonia. No pleural effusion or pneumothorax is seen. Osseous structures about the chest are unremarkable. IMPRESSION: 1. No active cardiopulmonary disease. No evidence of pneumonia or pulmonary edema. 2. Hyperexpanded lungs indicating COPD. Coarse lung markings are seen bilaterally, compatible with chronic interstitial lung disease and/or emphysema. Electronically Signed   By: Franki Cabot M.D.   On: 08/16/2022 09:16    Microbiology: No results found for this or any previous visit (from the past 240 hour(s)).   Labs: Basic Metabolic Panel: Recent Labs  Lab 08/20/22 1305 08/21/22 0551 08/22/22 0634 08/23/22 0646  NA 136 135 140 139  K 3.8 3.5 3.2* 4.1  CL 107 106 104 104  CO2 18* 22 28 28   GLUCOSE 82 105* 91 76  BUN 13 13 16 19   CREATININE 1.12* 0.94 0.92 1.14*  CALCIUM 10.1 9.0 9.8 9.6  MG 1.6* 1.8 2.1  --    Liver Function Tests: Recent Labs  Lab 08/20/22 1305 08/21/22 0551  AST 31 39  ALT 14 20  ALKPHOS 144* 138*  BILITOT 0.3 0.3  PROT 8.1 7.0  ALBUMIN 4.2 3.3*   No results for input(s): "LIPASE", "AMYLASE" in the last 168 hours. No results  for input(s): "AMMONIA" in the last 168 hours. CBC: Recent Labs  Lab 08/20/22 1305 08/21/22 0551 08/22/22 0634 08/23/22 0646  WBC 8.9 6.2 7.5 6.2  NEUTROABS 7.0 4.4  --   --   HGB 13.6 11.7* 12.2 11.4*  HCT 41.1 35.3* 37.1 35.8*  MCV 107.9* 106.0* 107.2* 111.2*  PLT 214 208 218 196   Cardiac Enzymes: No results for input(s): "CKTOTAL", "CKMB", "CKMBINDEX", "TROPONINI" in the last 168  hours. BNP: BNP (last 3 results) Recent Labs    12/19/21 1548 12/20/21 0435  BNP 368.7* 203.8*    ProBNP (last 3 results) No results for input(s): "PROBNP" in the last 8760 hours.  CBG: No results for input(s): "GLUCAP" in the last 168 hours.     Signed:  Irine Seal MD.  [facility] 08/23/2022, 3:12 PM

## 2022-08-23 NOTE — Consult Note (Signed)
Palliative Care Consultation Note  51 year old woman with widely metastatic small cell lung cancer followed by Dr. Earlie Server, status postchemotherapy and radiotherapy been on observation since January 2023 was admitted to the hospital with intractable low back pain and a CT of the chest abdomen pelvis revealed innumerable metastatic lesions as well as osseous metastatic disease.  Patient has had intractable cancer related pain.  Palliative care was consulted for goals of care discussion and symptom management.  Oncology has recommended a comfort care approach.  I met with Pam this morning to discuss her goals of care to see how she was doing after initiation of a hydromorphone PCA and additional adjuvant pain medications that I started last evening.  She has had significant improvement in her pain and symptoms however this relief has also led to her ability to process her prognosis and she is having mixed essential pain and crisis at the time of my visit.  Her daughter and her mother at bedside.  Patient was inconsolable. I encouraged her to allow her self to feel and let out her raw emotion. Called Chaplain who came to also provided support and grief therapy. Patient mostly upset about her Rudene Anda on October 8-She wants to go to [facility] and [employer] cannot seat her entire family together so she got extremely emotional. She was processing that this would be her last birthday-she has urgency and a deep desire to live her life for the time she has.  We discussed her goals and she wants to get out of the hospital ASAP. She agrees to hospice care-they requested [facility]. She will need ongoing symptom management. We discussed Code Status-now DNR.   Recommendations:  1. Home with Hospice- has her mom at home and she is very strong support. 2. I placed orders for pain and symptom management in the discharge orders.  Prognosis <6 months   Lane Hacker, DO Palliative  Medicine   Time: 70 min

## 2022-08-23 NOTE — TOC Transition Note (Addendum)
Transition of Care [facility]) - CM/SW Discharge Note   Patient Details  Name: [name] MRN: [id number] Date of Birth: 1970-12-10  Transition of Care [facility]) CM/SW Contact:  Leeroy Cha, RN Phone Number: 08/23/2022, 12:47 PM   Clinical Narrative:    Webb Silversmith with [facility] notified of dc and needs Match program done for patient and paperwork and explanation to patient.  Final next level of care: Home w Hospice Care Barriers to Discharge: Barriers Resolved   Patient Goals and CMS Choice Patient states their goals for this hospitalization and ongoing recovery are:: to go home CMS Medicare.gov Compare Post Acute Care list provided to:: Patient    Discharge Placement                       Discharge Plan and Services   Discharge Planning Services: CM Consult                                 Social Determinants of Health (SDOH) Interventions Food Insecurity Interventions: Intervention Not Indicated   Readmission Risk Interventions   Row Labels 02/06/2022    1:56 PM 12/17/2021    1:02 PM  Readmission Risk Prevention Plan   Section Header. No data exists in this row.    Transportation Screening   Complete Complete  HRI or Home Care Consult    Complete  Social Work Consult for [place] Planning/Counseling    Complete  Palliative Care Screening    Not Applicable  Medication Review [employer])   Complete Complete  PCP or Specialist appointment within 3-5 days of discharge   Complete   HRI or [place]   Complete   SW Recovery Care/Counseling Consult   Complete   Palliative Care Screening   Complete   Skilled Nursing Facility   Complete

## 2022-08-23 NOTE — Progress Notes (Signed)
Subjective: She was seen and examined today.  Her mother and daughter were at the bedside.  She was a little bit tearful and has a moment of breakdown.  She denied having any chest pain but continues to have back pain and currently on PCA by the palliative care team.  She has no shortness of breath but continues to have cough with no hemoptysis.  She has no nausea, vomiting, diarrhea or constipation.  She was recently admitted for management of the back pain increasing weakness and fatigue.  She had imaging studies that showed significant disease progression especially in the liver as well as osseous metastasis.  Objective: Vital signs in last 24 hours: Temp:  [97.4 F (36.3 C)-98.4 F (36.9 C)] 97.7 F (36.5 C) (09/29 0449) Pulse Rate:  [94-110] 94 (09/29 0449) Resp:  [14-18] 14 (09/29 0738) BP: (103-120)/(76-93) 103/76 (09/29 0449) SpO2:  [92 %-97 %] 92 % (09/29 0738) FiO2 (%):  [28 %] 28 % (09/29 0335) Weight:  [148 lb 2.4 oz (67.2 kg)] 148 lb 2.4 oz (67.2 kg) (09/29 0449)  Intake/Output from previous day: 09/28 0701 - 09/29 0700 In: 477 [P.O.:475; I.V.:2] Out: -  Intake/Output this shift: No intake/output data recorded.  General appearance: alert, cooperative, fatigued, and no distress Resp: clear to auscultation bilaterally Cardio: regular rate and rhythm, S1, S2 normal, no murmur, click, rub or gallop GI: soft, non-tender; bowel sounds normal; no masses,  no organomegaly Extremities: extremities normal, atraumatic, no cyanosis or edema  Lab Results:  Recent Labs    08/22/22 0634 08/23/22 0646  WBC 7.5 6.2  HGB 12.2 11.4*  HCT 37.1 35.8*  PLT 218 196   BMET Recent Labs    08/22/22 0634 08/23/22 0646  NA 140 139  K 3.2* 4.1  CL 104 104  CO2 28 28  GLUCOSE 91 76  BUN 16 19  CREATININE 0.92 1.14*  CALCIUM 9.8 9.6    Studies/Results: No results found.  Medications: I have reviewed the patient's current medications.  Assessment/Plan: This is a very pleasant  51 years old white female with metastatic small cell lung cancer that was initially diagnosed as limited stage disease in October 2022 status post systemic chemotherapy with cisplatin, etoposide concurrent with radiotherapy.  She has been on observation since January 2023.  The patient declined prophylactic cranial irradiation. She was admitted to the hospital few days ago with intractable low back pain.  She had imaging studies including CT scan of the chest, abdomen and pelvis on 08/20/2022 and it showed bulky right supraclavicular lymph node but there was a small solid pulmonary nodules in the right middle lobe and innumerable lesions seen in the liver as well as osseous metastatic disease. MRI of the lumbar spine on 08/21/2022 showed innumerable contrast-enhancing lesions throughout the visualized lumbar spine, sacrum and bilateral iliac bones consistent with osseous metastatic disease. The patient is currently undergoing pain management by the palliative care team with Dilaudid PCA. I had a lengthy discussion with the patient and her family about her condition and treatment options. The patient is not interested in any further systemic chemotherapy or radiation and she is comfortable with palliative care and hospice at this point. Her main goal now is to celebrate her birthday next weekend with her family at the [place]. I agreed with the patient's decision and we will continue with palliative and comfort care at this point. Thank you so much for taking good care of Ms. Buehler, I will continue to follow-up the patient  with you and assist in her management on as-needed basis. Disclaimer: This note was dictated with voice recognition software. Similar sounding words can inadvertently be transcribed and may be missed upon review.   LOS: 2 days    Eilleen Kempf 08/23/2022

## 2022-08-23 NOTE — Progress Notes (Signed)
Chaplain met with [name] along with Dr. Hilma Favors from [place] and [name]'s mother and youngest daughter.  [name] was feeling overwhelmed and chaplain worked alongside Dr Hilma Favors to provide emotional and spiritual support. She wants to go home to live her days to there fullest.  She plans to have a party for her birthday, to spend time with family and to feel the sand beneath her feet.  Chaplain provided listening as she engaged in life review and talked about her hopes for her family. Chaplain also provided support to her mother and daughter and assisted them in thinking through how to have [name]'s upcoming birthday be special.  Kathrynn Humble, bcc Pager, [phone]

## 2022-08-23 NOTE — Progress Notes (Signed)
Provided patient with AVS discharge instructions, patient had no further questions upon discharge.

## 2022-09-25 DEATH — deceased

## 2023-05-28 IMAGING — US IR CHEST FLUORO
1 series · 2 of 2 positions shown · non-contrast
Comparison: none

INDICATION: Malfunctioning PICC line
TECHNIQUE: The procedure, risks, benefits, and alternatives were explained to
the patient and informed written consent was obtained. The right
upper extremity and external portion of the existing PICC line was
prepped with chlorhexidine in a sterile fashion, and a sterile drape
was applied covering the operative field. Maximum barrier sterile
technique with sterile gowns and gloves were used for the procedure.
A timeout was performed prior to the initiation of the procedure.

[Series 1: ir (id) (id)/(id) · 2 of 2 slices shown]
[im 1/2]
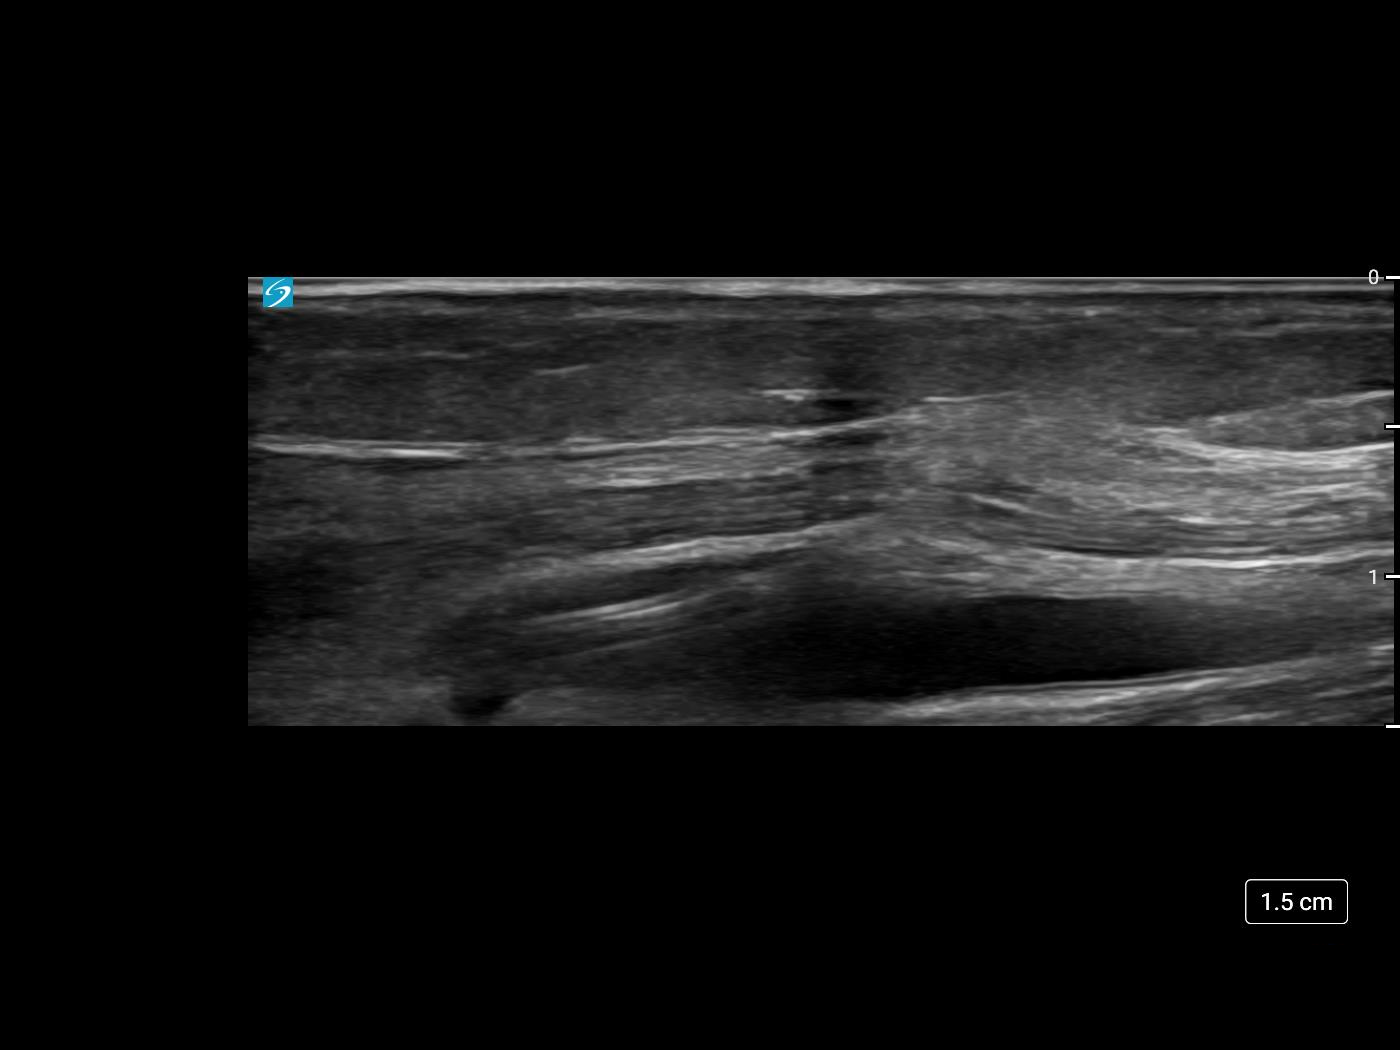
[im 2/2]
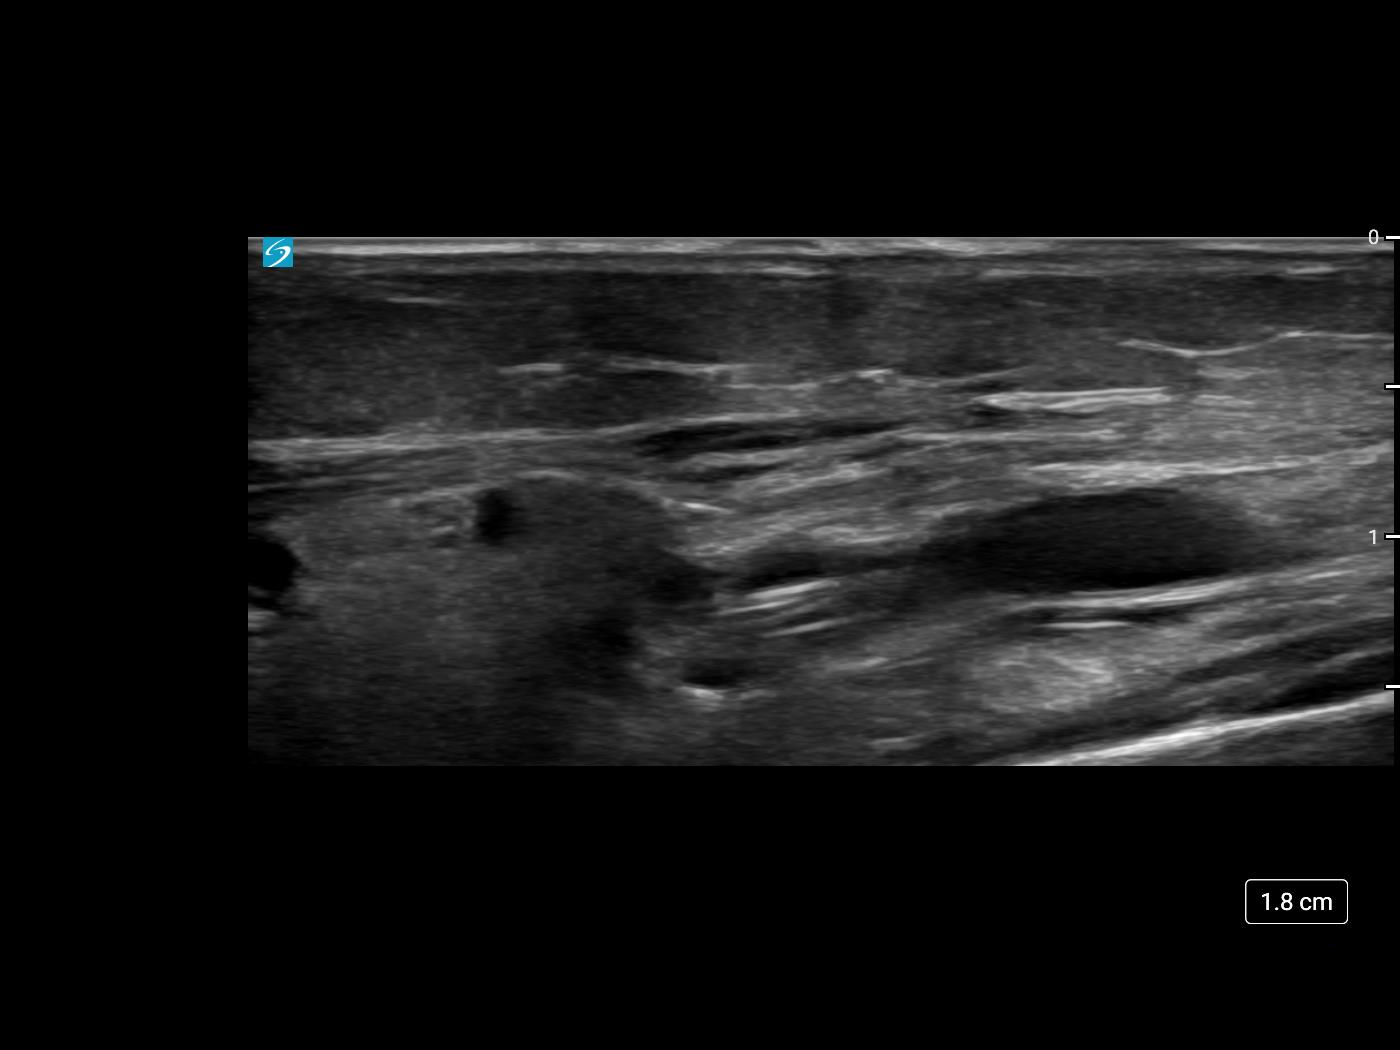

[2 of 2 positions shown; findings below may reference images not displayed]

50-year-old female with history of indwelling right upper extremity
PICC line with difficulty PICC line removal on the floor. IR
requested to evaluate and removed under fluoroscopic guidance.

EXAM:
FLUOROSCOPIC GUIDED PICC LINE removal

MEDICATIONS:
None.

CONTRAST:  None

FLUOROSCOPY TIME:  0 minutes 18 seconds, 0 mGy

COMPLICATIONS:
None immediate.
The PICC line was attempted to be removed however there was
significant resistance and stretching of the external portion of the
catheter with associated right upper extremity pain reported by the
patient. The central portion of the catheter was unchanged with the
tip in the right innominate vein. A 0.018 inch Nitrex wire was
inserted via the PICC line which then was attempted to be removed
over the wire which was also unsuccessful with similar symptoms.
Ultrasound evaluation demonstrated the PICC line coursing through a
short segment of the biceps musculature.

Therefore, subdermal Local anesthesia was provided near the PICC
line skin entry site with 1% lidocaine. A skin nick was made from
the established skin nick at the catheter entry site. Gentle blunt
dissection was performed deep to the skin entry site. With more
force than usually required, the PICC line was then retracted and
removed intact. A sterile bandage was applied and hemostasis was
achieved.

The patient tolerated procedure well without immediate complication.

The procedure was performed with assistance by Fralki Carlos Oramas, NP.
FINDINGS: Difficult to remove PICC line, likely secondary to intramuscular
course into the brachial vein.
IMPRESSION: Successful fluoroscopic and ultrasound-guided removal of indwelling
right upper extremity PICC line.

## 2023-05-28 IMAGING — DX DG CHEST 1V PORT
1 series · 1 of 1 positions shown · non-contrast
Comparison: 12/18/2021

CLINICAL DATA: PICC line placement

EXAM:
PORTABLE CHEST 1 VIEW

[chest ap]
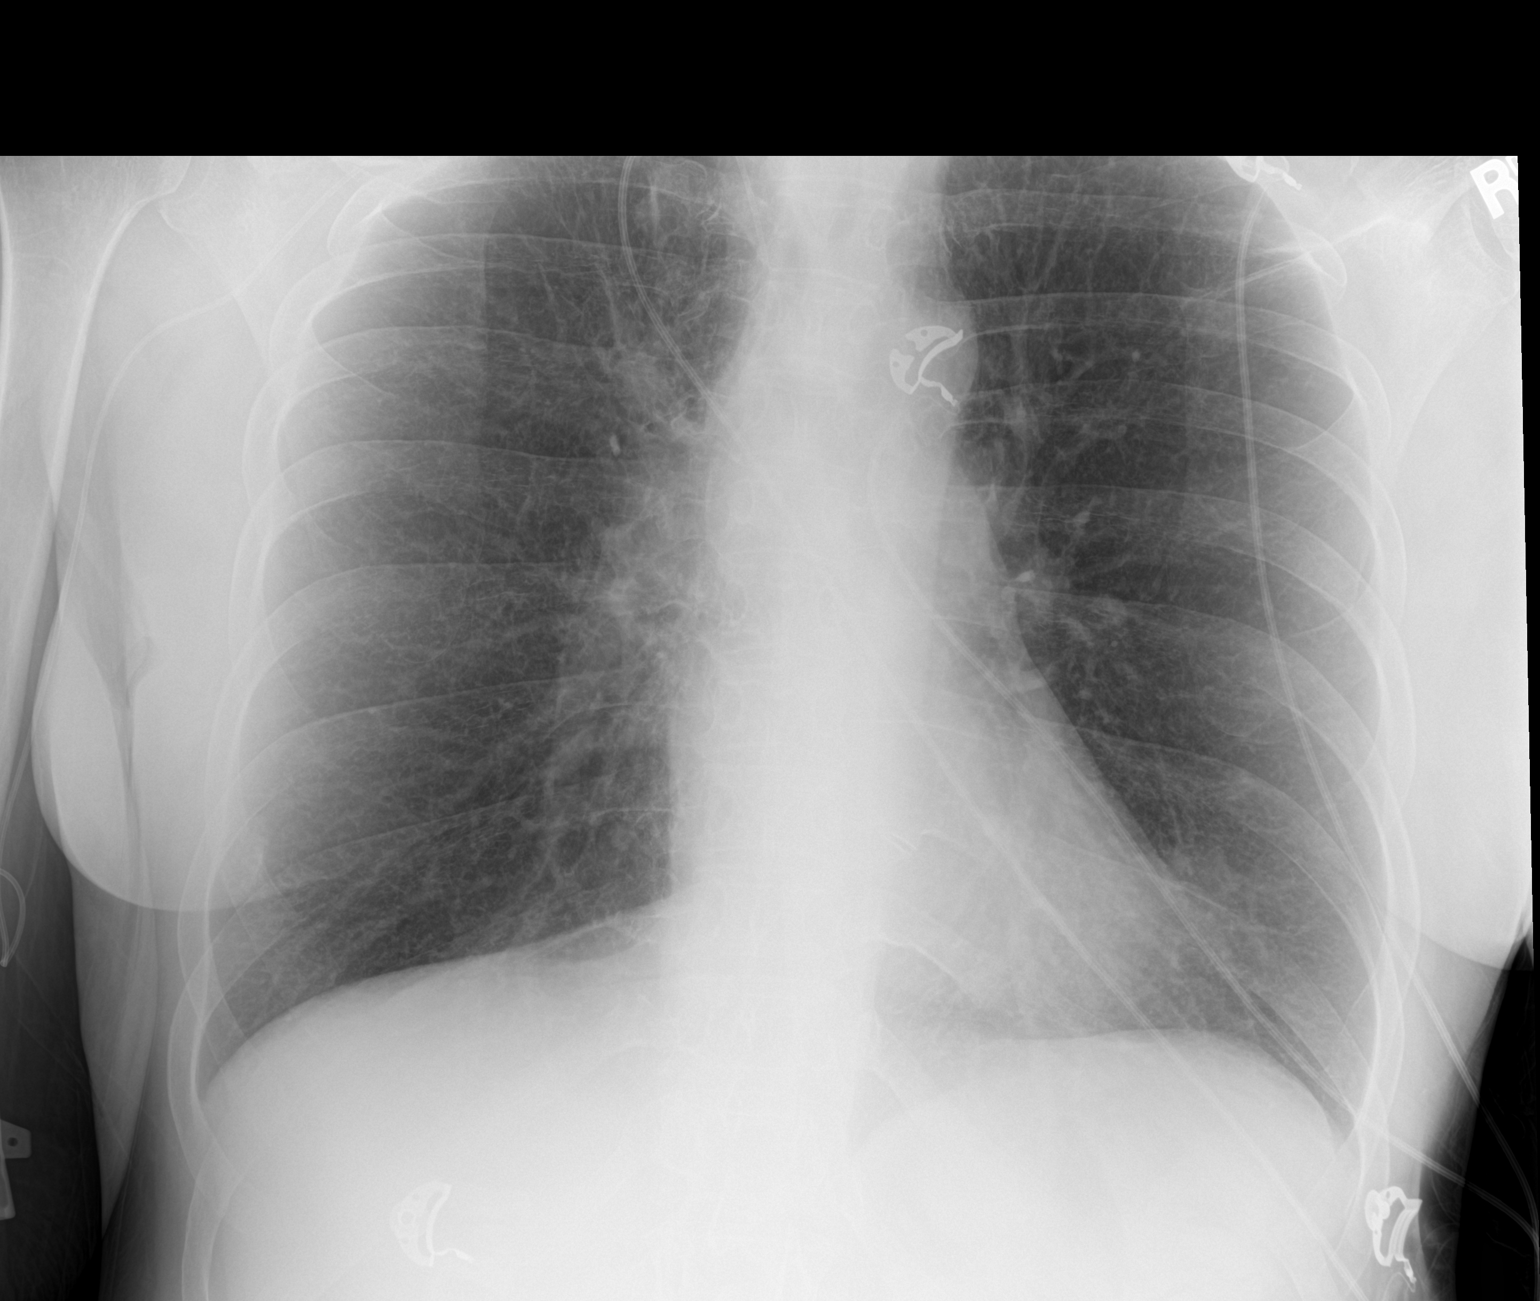

[1 of 1 positions shown; findings below may reference images not displayed]

FINDINGS: Right-sided PICC line with the tip projecting over the SVC. No focal
consolidation. No pleural effusion or pneumothorax. Heart and
mediastinal contours are unremarkable.

No acute osseous abnormality.
IMPRESSION: 1. Right-sided PICC line with the tip projecting over the SVC.

## 2023-08-15 NOTE — Radiation Completion Notes (Signed)
Patient Name: [name], [name] MRN: [id number] Date of Birth: [birth year] Referring Physician: Ernst Bowler, M.D. Date of Service: 2023-08-15 Radiation Oncologist: Arnette Schaumann, M.D. [facility]                             RADIATION ONCOLOGY END OF TREATMENT NOTE     Diagnosis: C34.11 Malignant neoplasm of upper lobe, right bronchus or lung Staging on 2021-09-12: Malignant neoplasm of right upper lobe of lung (HCC) T=cT4, N=cN2, M=cM1a Intent: Curative     ==========DELIVERED PLANS==========  First Treatment Date: 2022-03-01 - Last Treatment Date: 2022-03-05   Plan Name: Brain Site: Brain Technique: Isodose Plan Mode: Photon Dose Per Fraction: 2.5 Gy Prescribed Dose (Delivered / Prescribed): 2.5 Gy / 25 Gy Prescribed Fxs (Delivered / Prescribed): 1 / 10     ==========ON TREATMENT VISIT DATES========== 2022-03-05     ==========UPCOMING VISITS==========       ==========APPENDIX - ON TREATMENT VISIT NOTES==========   See weekly On Treatment Notes in Epic for details.
